# Patient Record
Sex: Male | Born: 1940
Health system: Southern US, Community
[De-identification: ages and names within clinical notes are randomized; demographics above are authoritative.]

## PROBLEM LIST (undated history)

## (undated) DIAGNOSIS — G629 Polyneuropathy, unspecified: Secondary | ICD-10-CM

## (undated) DIAGNOSIS — C801 Malignant (primary) neoplasm, unspecified: Secondary | ICD-10-CM

## (undated) DIAGNOSIS — N200 Calculus of kidney: Secondary | ICD-10-CM

## (undated) DIAGNOSIS — N400 Enlarged prostate without lower urinary tract symptoms: Secondary | ICD-10-CM

## (undated) DIAGNOSIS — M549 Dorsalgia, unspecified: Secondary | ICD-10-CM

## (undated) DIAGNOSIS — N189 Chronic kidney disease, unspecified: Secondary | ICD-10-CM

## (undated) DIAGNOSIS — F329 Major depressive disorder, single episode, unspecified: Secondary | ICD-10-CM

## (undated) DIAGNOSIS — M1990 Unspecified osteoarthritis, unspecified site: Secondary | ICD-10-CM

## (undated) DIAGNOSIS — R1310 Dysphagia, unspecified: Secondary | ICD-10-CM

## (undated) DIAGNOSIS — N10 Acute tubulo-interstitial nephritis: Secondary | ICD-10-CM

## (undated) DIAGNOSIS — J189 Pneumonia, unspecified organism: Secondary | ICD-10-CM

## (undated) DIAGNOSIS — R519 Headache, unspecified: Secondary | ICD-10-CM

## (undated) DIAGNOSIS — I10 Essential (primary) hypertension: Secondary | ICD-10-CM

## (undated) DIAGNOSIS — M25559 Pain in unspecified hip: Secondary | ICD-10-CM

## (undated) DIAGNOSIS — Z87442 Personal history of urinary calculi: Secondary | ICD-10-CM

## (undated) DIAGNOSIS — G8929 Other chronic pain: Secondary | ICD-10-CM

## (undated) DIAGNOSIS — J45909 Unspecified asthma, uncomplicated: Secondary | ICD-10-CM

## (undated) DIAGNOSIS — R918 Other nonspecific abnormal finding of lung field: Secondary | ICD-10-CM

## (undated) DIAGNOSIS — K219 Gastro-esophageal reflux disease without esophagitis: Secondary | ICD-10-CM

## (undated) DIAGNOSIS — F32A Depression, unspecified: Secondary | ICD-10-CM

## (undated) DIAGNOSIS — R51 Headache: Secondary | ICD-10-CM

## (undated) DIAGNOSIS — F4310 Post-traumatic stress disorder, unspecified: Secondary | ICD-10-CM

## (undated) DIAGNOSIS — F0390 Unspecified dementia without behavioral disturbance: Secondary | ICD-10-CM

## (undated) DIAGNOSIS — J449 Chronic obstructive pulmonary disease, unspecified: Secondary | ICD-10-CM

## (undated) DIAGNOSIS — G2581 Restless legs syndrome: Secondary | ICD-10-CM

## (undated) HISTORY — PX: ABDOMINAL SURGERY: SHX537

## (undated) HISTORY — PX: OTHER SURGICAL HISTORY: SHX169

## (undated) HISTORY — PX: EYE SURGERY: SHX253

## (undated) HISTORY — PX: HERNIA REPAIR: SHX51

---

## 2006-03-15 HISTORY — PX: FRACTURE SURGERY: SHX138

## 2012-08-30 ENCOUNTER — Encounter (HOSPITAL_COMMUNITY): Payer: Self-pay | Admitting: *Deleted

## 2012-08-30 ENCOUNTER — Emergency Department (HOSPITAL_COMMUNITY)
Admission: EM | Admit: 2012-08-30 | Discharge: 2012-08-30 | Disposition: A | Discharge status: Home / Self Care | Payer: Medicare Other | Attending: Emergency Medicine | Admitting: Emergency Medicine

## 2012-08-30 DIAGNOSIS — Z79899 Other long term (current) drug therapy: Secondary | ICD-10-CM | POA: Insufficient documentation

## 2012-08-30 DIAGNOSIS — F329 Major depressive disorder, single episode, unspecified: Secondary | ICD-10-CM | POA: Insufficient documentation

## 2012-08-30 DIAGNOSIS — Z760 Encounter for issue of repeat prescription: Secondary | ICD-10-CM | POA: Insufficient documentation

## 2012-08-30 DIAGNOSIS — F17200 Nicotine dependence, unspecified, uncomplicated: Secondary | ICD-10-CM | POA: Insufficient documentation

## 2012-08-30 DIAGNOSIS — M545 Low back pain, unspecified: Secondary | ICD-10-CM | POA: Insufficient documentation

## 2012-08-30 DIAGNOSIS — J45909 Unspecified asthma, uncomplicated: Secondary | ICD-10-CM | POA: Insufficient documentation

## 2012-08-30 DIAGNOSIS — G8929 Other chronic pain: Secondary | ICD-10-CM | POA: Insufficient documentation

## 2012-08-30 DIAGNOSIS — F3289 Other specified depressive episodes: Secondary | ICD-10-CM | POA: Insufficient documentation

## 2012-08-30 HISTORY — DX: Depression, unspecified: F32.A

## 2012-08-30 HISTORY — DX: Unspecified asthma, uncomplicated: J45.909

## 2012-08-30 HISTORY — DX: Major depressive disorder, single episode, unspecified: F32.9

## 2012-08-30 MED ORDER — TRAMADOL HCL 50 MG PO TABS: 100.0000 mg | ORAL_TABLET | Freq: Four times a day (QID) | ORAL | Status: DC | PRN

## 2012-08-30 MED ORDER — CYCLOBENZAPRINE HCL 10 MG PO TABS: 10.0000 mg | ORAL_TABLET | Freq: Three times a day (TID) | ORAL | Status: DC | PRN

## 2012-08-30 NOTE — ED Provider Notes (Signed)
History     CSN: [id number]  Arrival date & time 08/30/12  1346   First MD Initiated Contact with Patient 08/30/12 1402      No chief complaint on file.   (Consider location/radiation/quality/duration/timing/severity/associated sxs/prior treatment) HPI  Patient states he moved from [place] about 3 months ago and he has run out of his medications for about 3 months. He states he is trying to get his insurance card straight so currently he is without a PCP. Patient's only physical complaint is that he is having difficulty sleeping at night and he has chronic low back pain. When asked what medications patient was on he initially stated "the blue pill". On further questioning he states he was on tramadol, trazodone, and Flexeril. He however does not know the doses of any of his medications.  PCP none  Past Medical History  Diagnosis Date  . Depression   . Back pain   . Asthma     Past Surgical History  Procedure Laterality Date  . Eye surgery    . Abdominal surgery      History reviewed. No pertinent family history.  History  Substance Use Topics  . Smoking status: Current Every Day Smoker  . Smokeless tobacco: Not on file  . Alcohol Use: No  lives with daughter Smokes 3/4 ppd   Review of Systems  All other systems reviewed and are negative.    Allergies  Penicillins and Strawberry  Home Medications   Current Outpatient Rx  Name  Route  Sig  Dispense  Refill  . baclofen (LIORESAL) 10 MG tablet   Oral   Take 10 mg by mouth 3 (three) times daily.          . cyclobenzaprine (FLEXERIL) 10 MG tablet   Oral   Take 10 mg by mouth 3 (three) times daily as needed for muscle spasms.         . pantoprazole (PROTONIX) 40 MG tablet   Oral   Take 40 mg by mouth daily.         . pramipexole (MIRAPEX) 0.125 MG tablet   Oral   Take 0.125 mg by mouth at bedtime.         Marland Kitchen PRESCRIPTION MEDICATION   Oral   Take 1 tablet by mouth daily as needed.  Cyclobenzaprine-no record at pharmacy, pt not sure either         . traMADol (ULTRAM) 50 MG tablet   Oral   Take 50 mg by mouth every 6 (six) hours as needed for pain.         Marland Kitchen albuterol (PROVENTIL HFA;VENTOLIN HFA) 108 (90 BASE) MCG/ACT inhaler   Inhalation   Inhale 2 puffs into the lungs every 4 (four) hours as needed for wheezing or shortness of breath.         . DiphenhydrAMINE HCl, Sleep, (SLEEPING PO)   Oral   Take 1 tablet by mouth at bedtime as needed (sleep).           BP 162/88  Pulse 94  Temp(Src) 97.8 F (36.6 C) (Oral)  Resp 20  Ht 6\' 3"  (1.905 m)  Wt 163 lb (73.936 kg)  BMI 20.37 kg/m2  Vital signs normal except mild hypertension   Physical Exam  Nursing note and vitals reviewed. Constitutional: He is oriented to person, place, and time. He appears well-developed and well-nourished.  Non-toxic appearance. He does not appear ill. No distress.  HENT:  Head: Normocephalic and atraumatic.  Right Ear: External  ear normal.  Left Ear: External ear normal.  Nose: Nose normal. No mucosal edema or rhinorrhea.  Mouth/Throat: Oropharynx is clear and moist and mucous membranes are normal. No dental abscesses or edematous.  Eyes: Conjunctivae and EOM are normal. Pupils are equal, round, and reactive to light.  Neck: Normal range of motion and full passive range of motion without pain. Neck supple.  Cardiovascular: Normal rate, regular rhythm and normal heart sounds.  Exam reveals no gallop and no friction rub.   No murmur heard. Pulmonary/Chest: Effort normal and breath sounds normal. No respiratory distress. He has no wheezes. He has no rhonchi. He has no rales. He exhibits no tenderness and no crepitus.  Abdominal: Soft. Normal appearance and bowel sounds are normal. He exhibits no distension. There is no tenderness. There is no rebound and no guarding.  Musculoskeletal: Normal range of motion. He exhibits no edema and no tenderness.  Moves all extremities well.    Neurological: He is alert and oriented to person, place, and time. He has normal strength. No cranial nerve deficit.  Skin: Skin is warm, dry and intact. No rash noted. No erythema. No pallor.  Psychiatric: He has a normal mood and affect. His speech is normal and behavior is normal. His mood appears not anxious.    ED Course  Procedures (including critical care time)  The pharmacy tech initially contacted the [facility] locally who said patient had been on baclofen, protonic, and albuterol. He also told her he was taking Benadryl for sleep. When I discussed with patient that she did not find evidence of him being on the medication he said he was taking he then stated he had gotten his medications filled at the [facility]. They report to the pharmacy tech that patient had a prescription for trazodone which he never got filled. They did verify he has been on tramadol in the past. They also verify he had been on Flexeril in the past.    1. Medication refill     Discharge Medication List as of 08/30/2012  3:15 PM    START taking these medications   Details  !! cyclobenzaprine (FLEXERIL) 10 MG tablet Take 1 tablet (10 mg total) by mouth 3 (three) times daily as needed for muscle spasms., Starting 08/30/2012, Until Discontinued, Print    !! traMADol (ULTRAM) 50 MG tablet Take 2 tablets (100 mg total) by mouth every 6 (six) hours as needed., Starting 08/30/2012, Until Discontinued, Print     !! - Potential duplicate medications found. Please discuss with provider.      Plan discharge  Devoria Albe, MD, FACEP    MDM          Ward Givens, MD 08/30/12 [id number]

## 2012-08-30 NOTE — ED Notes (Addendum)
Pt says he needs a med refill . Has been unable to get appt for refill for 3 mos. Says he needs baclofen, trazadone, tramadol, albuterol inhaler.  His MD  Is in [place]. He has moved here and unable to get MD to get these meds.

## 2014-12-23 ENCOUNTER — Inpatient Hospital Stay (HOSPITAL_COMMUNITY)
Admission: EM | Admit: 2014-12-23 | Discharge: 2014-12-25 | DRG: 690 | Disposition: A | Discharge status: Home / Self Care | Payer: Medicare Other | Attending: Internal Medicine | Admitting: Internal Medicine

## 2014-12-23 ENCOUNTER — Encounter (HOSPITAL_COMMUNITY): Payer: Self-pay | Admitting: Emergency Medicine

## 2014-12-23 ENCOUNTER — Emergency Department (HOSPITAL_COMMUNITY): Payer: Medicare Other

## 2014-12-23 DIAGNOSIS — F17210 Nicotine dependence, cigarettes, uncomplicated: Secondary | ICD-10-CM | POA: Diagnosis present

## 2014-12-23 DIAGNOSIS — Z720 Tobacco use: Secondary | ICD-10-CM | POA: Diagnosis present

## 2014-12-23 DIAGNOSIS — E876 Hypokalemia: Secondary | ICD-10-CM | POA: Diagnosis present

## 2014-12-23 DIAGNOSIS — N179 Acute kidney failure, unspecified: Secondary | ICD-10-CM | POA: Diagnosis present

## 2014-12-23 DIAGNOSIS — D649 Anemia, unspecified: Secondary | ICD-10-CM | POA: Diagnosis present

## 2014-12-23 DIAGNOSIS — E871 Hypo-osmolality and hyponatremia: Secondary | ICD-10-CM | POA: Diagnosis present

## 2014-12-23 DIAGNOSIS — Z23 Encounter for immunization: Secondary | ICD-10-CM

## 2014-12-23 DIAGNOSIS — R918 Other nonspecific abnormal finding of lung field: Secondary | ICD-10-CM | POA: Diagnosis present

## 2014-12-23 DIAGNOSIS — R1032 Left lower quadrant pain: Secondary | ICD-10-CM | POA: Diagnosis not present

## 2014-12-23 DIAGNOSIS — N10 Acute tubulo-interstitial nephritis: Principal | ICD-10-CM

## 2014-12-23 DIAGNOSIS — R109 Unspecified abdominal pain: Secondary | ICD-10-CM

## 2014-12-23 DIAGNOSIS — E861 Hypovolemia: Secondary | ICD-10-CM | POA: Diagnosis present

## 2014-12-23 DIAGNOSIS — B9620 Unspecified Escherichia coli [E. coli] as the cause of diseases classified elsewhere: Secondary | ICD-10-CM | POA: Diagnosis present

## 2014-12-23 DIAGNOSIS — M1990 Unspecified osteoarthritis, unspecified site: Secondary | ICD-10-CM | POA: Diagnosis present

## 2014-12-23 HISTORY — DX: Other chronic pain: G89.29

## 2014-12-23 HISTORY — DX: Calculus of kidney: N20.0

## 2014-12-23 HISTORY — DX: Other nonspecific abnormal finding of lung field: R91.8

## 2014-12-23 HISTORY — DX: Dorsalgia, unspecified: M54.9

## 2014-12-23 HISTORY — DX: Pain in unspecified hip: M25.559

## 2014-12-23 HISTORY — DX: Polyneuropathy, unspecified: G62.9

## 2014-12-23 HISTORY — DX: Acute pyelonephritis: N10

## 2014-12-23 LAB — URINALYSIS, ROUTINE W REFLEX MICROSCOPIC
Bilirubin Urine: NEGATIVE
GLUCOSE, UA: NEGATIVE mg/dL
Ketones, ur: NEGATIVE mg/dL
Nitrite: POSITIVE — AB
PH: 6 (ref 5.0–8.0)
Protein, ur: 30 mg/dL — AB
SPECIFIC GRAVITY, URINE: 1.015 (ref 1.005–1.030)
Urobilinogen, UA: 1 mg/dL (ref 0.0–1.0)

## 2014-12-23 LAB — BASIC METABOLIC PANEL
Anion gap: 7 (ref 5–15)
BUN: 17 mg/dL (ref 6–20)
CALCIUM: 7.4 mg/dL — AB (ref 8.9–10.3)
CO2: 28 mmol/L (ref 22–32)
Chloride: 96 mmol/L — ABNORMAL LOW (ref 101–111)
Creatinine, Ser: 1.36 mg/dL — ABNORMAL HIGH (ref 0.61–1.24)
GFR calc Af Amer: 57 mL/min — ABNORMAL LOW (ref >60)
GFR, EST NON AFRICAN AMERICAN: 49 mL/min — AB (ref >60)
GLUCOSE: 103 mg/dL — AB (ref 65–99)
Potassium: 3.3 mmol/L — ABNORMAL LOW (ref 3.5–5.1)
Sodium: 131 mmol/L — ABNORMAL LOW (ref 135–145)

## 2014-12-23 LAB — CBC WITH DIFFERENTIAL/PLATELET
Basophils Absolute: 0 10*3/uL (ref 0.0–0.1)
Basophils Relative: 0 %
EOS PCT: 1 %
Eosinophils Absolute: 0.1 10*3/uL (ref 0.0–0.7)
HEMATOCRIT: 37.4 % — AB (ref 39.0–52.0)
Hemoglobin: 12.5 g/dL — ABNORMAL LOW (ref 13.0–17.0)
LYMPHS ABS: 0.9 10*3/uL (ref 0.7–4.0)
LYMPHS PCT: 9 %
MCH: 31.9 pg (ref 26.0–34.0)
MCHC: 33.4 g/dL (ref 30.0–36.0)
MCV: 95.4 fL (ref 78.0–100.0)
MONO ABS: 1 10*3/uL (ref 0.1–1.0)
MONOS PCT: 11 %
Neutro Abs: 7.5 10*3/uL (ref 1.7–7.7)
Neutrophils Relative %: 79 %
PLATELETS: 263 10*3/uL (ref 150–400)
RBC: 3.92 MIL/uL — ABNORMAL LOW (ref 4.22–5.81)
RDW: 14.1 % (ref 11.5–15.5)
WBC: 9.5 10*3/uL (ref 4.0–10.5)

## 2014-12-23 LAB — URINE MICROSCOPIC-ADD ON

## 2014-12-23 MED ORDER — GABAPENTIN 300 MG PO CAPS
300.0000 mg | ORAL_CAPSULE | Freq: Three times a day (TID) | ORAL | Status: DC
  Administered 2014-12-23 – 2014-12-25 (×6): 300 mg via ORAL
  Filled 2014-12-23 (×6): qty 1

## 2014-12-23 MED ORDER — HYDROMORPHONE HCL 1 MG/ML IJ SOLN: 0.5000 mg | INTRAMUSCULAR | Status: AC | PRN

## 2014-12-23 MED ORDER — POTASSIUM CHLORIDE CRYS ER 20 MEQ PO TBCR
40.0000 meq | EXTENDED_RELEASE_TABLET | Freq: Once | ORAL | Status: AC
  Administered 2014-12-23: 40 meq via ORAL
  Filled 2014-12-23: qty 2

## 2014-12-23 MED ORDER — FLUTICASONE PROPIONATE 50 MCG/ACT NA SUSP: 1.0000 | Freq: Every day | NASAL | Status: DC | PRN

## 2014-12-23 MED ORDER — ENSURE ENLIVE PO LIQD
237.0000 mL | Freq: Two times a day (BID) | ORAL | Status: DC
  Administered 2014-12-24 – 2014-12-25 (×4): 237 mL via ORAL

## 2014-12-23 MED ORDER — TRAZODONE HCL 50 MG PO TABS
200.0000 mg | ORAL_TABLET | Freq: Every day | ORAL | Status: DC
  Administered 2014-12-23 – 2014-12-24 (×2): 200 mg via ORAL
  Filled 2014-12-23 (×2): qty 4

## 2014-12-23 MED ORDER — PANTOPRAZOLE SODIUM 40 MG PO TBEC
40.0000 mg | DELAYED_RELEASE_TABLET | Freq: Every day | ORAL | Status: DC
Start: 2014-12-24 — End: 2014-12-25
  Administered 2014-12-24 – 2014-12-25 (×2): 40 mg via ORAL
  Filled 2014-12-23 (×2): qty 1

## 2014-12-23 MED ORDER — PRAMIPEXOLE DIHYDROCHLORIDE 0.25 MG PO TABS
0.1250 mg | ORAL_TABLET | Freq: Every day | ORAL | Status: DC
  Administered 2014-12-23 – 2014-12-24 (×2): 0.125 mg via ORAL
  Filled 2014-12-23: qty 0.5
  Filled 2014-12-23: qty 1
  Filled 2014-12-23: qty 0.5
  Filled 2014-12-23: qty 1

## 2014-12-23 MED ORDER — ALBUTEROL SULFATE (2.5 MG/3ML) 0.083% IN NEBU: 3.0000 mL | INHALATION_SOLUTION | RESPIRATORY_TRACT | Status: DC | PRN

## 2014-12-23 MED ORDER — SODIUM CHLORIDE 0.9 % IV SOLN
INTRAVENOUS | Status: DC
  Administered 2014-12-23 – 2014-12-25 (×2): via INTRAVENOUS

## 2014-12-23 MED ORDER — TRAMADOL HCL 50 MG PO TABS
50.0000 mg | ORAL_TABLET | Freq: Every day | ORAL | Status: DC | PRN
  Administered 2014-12-23 – 2014-12-24 (×2): 50 mg via ORAL
  Filled 2014-12-23 (×2): qty 1

## 2014-12-23 MED ORDER — ONDANSETRON HCL 4 MG PO TABS: 4.0000 mg | ORAL_TABLET | Freq: Four times a day (QID) | ORAL | Status: DC | PRN

## 2014-12-23 MED ORDER — ONDANSETRON HCL 4 MG/2ML IJ SOLN: 4.0000 mg | Freq: Four times a day (QID) | INTRAMUSCULAR | Status: DC | PRN

## 2014-12-23 MED ORDER — SERTRALINE HCL 50 MG PO TABS
50.0000 mg | ORAL_TABLET | Freq: Every day | ORAL | Status: DC
  Administered 2014-12-24 – 2014-12-25 (×2): 50 mg via ORAL
  Filled 2014-12-23 (×2): qty 1

## 2014-12-23 MED ORDER — CIPROFLOXACIN IN D5W 400 MG/200ML IV SOLN
400.0000 mg | Freq: Two times a day (BID) | INTRAVENOUS | Status: DC
  Administered 2014-12-23 – 2014-12-25 (×4): 400 mg via INTRAVENOUS
  Filled 2014-12-23 (×4): qty 200

## 2014-12-23 MED ORDER — INFLUENZA VAC SPLIT QUAD 0.5 ML IM SUSY
0.5000 mL | PREFILLED_SYRINGE | INTRAMUSCULAR | Status: DC
  Filled 2014-12-23: qty 0.5

## 2014-12-23 MED ORDER — ENOXAPARIN SODIUM 40 MG/0.4ML ~~LOC~~ SOLN
40.0000 mg | SUBCUTANEOUS | Status: DC
  Administered 2014-12-23 – 2014-12-24 (×2): 40 mg via SUBCUTANEOUS
  Filled 2014-12-23 (×2): qty 0.4

## 2014-12-23 MED ORDER — PNEUMOCOCCAL VAC POLYVALENT 25 MCG/0.5ML IJ INJ
0.5000 mL | INJECTION | INTRAMUSCULAR | Status: DC
  Filled 2014-12-23: qty 0.5

## 2014-12-23 MED ORDER — TAMSULOSIN HCL 0.4 MG PO CAPS
0.4000 mg | ORAL_CAPSULE | Freq: Every day | ORAL | Status: DC
  Administered 2014-12-23 – 2014-12-24 (×2): 0.4 mg via ORAL
  Filled 2014-12-23 (×2): qty 1

## 2014-12-23 MED ORDER — CEFTRIAXONE SODIUM 1 G IJ SOLR
1.0000 g | Freq: Once | INTRAMUSCULAR | Status: AC
  Administered 2014-12-23: 1 g via INTRAVENOUS
  Filled 2014-12-23: qty 10

## 2014-12-23 NOTE — ED Provider Notes (Signed)
CSN: [id number]     Arrival date & time 12/23/14  1556 History   First MD Initiated Contact with Patient 12/23/14 1659     Chief Complaint  Patient presents with  . Flank Pain     HPI  Pt was seen at 1700. Per pt, c/o gradual onset and worsening of persistent dysuria for the past 1 month. Has been associated with left sided flank "pain."  Denies fevers, no abd pain, no N/V/D, no hematuria, no testicular pain/swelling, no rash.   Past Medical History  Diagnosis Date  . Depression   . Asthma   . Chronic back pain   . Chronic hip pain   . Kidney stone    Past Surgical History  Procedure Laterality Date  . Eye surgery    . Abdominal surgery      heria    Social History  Substance Use Topics  . Smoking status: Current Every Day Smoker -- 0.50 packs/day    Types: Cigarettes  . Smokeless tobacco: None  . Alcohol Use: No    Review of Systems ROS: Statement: All systems negative except as marked or noted in the HPI; Constitutional: Negative for fever and chills. ; ; Eyes: Negative for eye pain, redness and discharge. ; ; ENMT: Negative for ear pain, hoarseness, nasal congestion, sinus pressure and sore throat. ; ; Cardiovascular: Negative for chest pain, palpitations, diaphoresis, dyspnea and peripheral edema. ; ; Respiratory: Negative for cough, wheezing and stridor. ; ; Gastrointestinal: Negative for nausea, vomiting, diarrhea, abdominal pain, blood in stool, hematemesis, jaundice and rectal bleeding. . ; ; Genitourinary: +dysuria, flank pain. Negative for hematuria. ; ; Genital:  No penile drainage or rash, no testicular pain or swelling, no scrotal rash or swelling. ;; Musculoskeletal: Negative for back pain and neck pain. Negative for swelling and trauma.; ; Skin: Negative for pruritus, rash, abrasions, blisters, bruising and skin lesion.; ; Neuro: Negative for headache, lightheadedness and neck stiffness. Negative for weakness, altered level of consciousness , altered mental status,  extremity weakness, paresthesias, involuntary movement, seizure and syncope.      Allergies  Bee venom; Penicillins; and Strawberry  Home Medications   Prior to Admission medications   Medication Sig Start Date End Date Taking? Authorizing Provider  albuterol (PROVENTIL HFA;VENTOLIN HFA) 108 (90 BASE) MCG/ACT inhaler Inhale 2 puffs into the lungs every 4 (four) hours as needed for wheezing or shortness of breath.   Yes Historical Provider, MD  DiphenhydrAMINE HCl, Sleep, (SLEEPING PO) Take 1 tablet by mouth at bedtime as needed (sleep).   Yes Historical Provider, MD  fluticasone (FLONASE) 50 MCG/ACT nasal spray Place 1 spray into both nostrils daily as needed for allergies or rhinitis.   Yes Historical Provider, MD  gabapentin (NEURONTIN) 300 MG capsule Take 300 mg by mouth 3 (three) times daily.   Yes Historical Provider, MD  naproxen (NAPROSYN) 500 MG tablet Take 500 mg by mouth every 12 (twelve) hours as needed for moderate pain.   Yes Historical Provider, MD  omeprazole (PRILOSEC) 20 MG capsule Take 20 mg by mouth daily.   Yes Historical Provider, MD  Pediatric Multiple Vit-C-FA (FLINSTONES GUMMIES OMEGA-3 DHA PO) Take 1 tablet by mouth 2 (two) times daily.   Yes Historical Provider, MD  pramipexole (MIRAPEX) 0.125 MG tablet Take 0.125 mg by mouth at bedtime.   Yes Historical Provider, MD  sertraline (ZOLOFT) 50 MG tablet Take 50 mg by mouth daily.   Yes Historical Provider, MD  tamsulosin (FLOMAX) 0.4 MG CAPS  capsule Take 0.4 mg by mouth at bedtime.   Yes Historical Provider, MD  traMADol (ULTRAM) 50 MG tablet Take 50 mg by mouth daily as needed for moderate pain.   Yes Historical Provider, MD  traZODone (DESYREL) 100 MG tablet Take 200 mg by mouth at bedtime.   Yes Historical Provider, MD  cyclobenzaprine (FLEXERIL) 10 MG tablet Take 1 tablet (10 mg total) by mouth 3 (three) times daily as needed for muscle spasms. Patient not taking: Reported on 12/23/2014 08/30/12   Rolland Porter, MD    BP 124/88 mmHg  Pulse 76  Temp(Src) 98.6 F (37 C) (Oral)  Resp 18  Ht 6' 3.5" (1.918 m)  Wt 209 lb (94.802 kg)  BMI 25.77 kg/m2  SpO2 100% Physical Exam  1705: Physical examination:  Nursing notes reviewed; Vital signs and O2 SAT reviewed;  Constitutional: Well developed, Well nourished, Well hydrated, In no acute distress; Head:  Normocephalic, atraumatic; Eyes: EOMI, PERRL, No scleral icterus; ENMT: Mouth and pharynx normal, Mucous membranes moist; Neck: Supple, Full range of motion, No lymphadenopathy; Cardiovascular: Regular rate and rhythm, No gallop; Respiratory: Breath sounds clear & equal bilaterally, No wheezes.  Speaking full sentences with ease, Normal respiratory effort/excursion; Chest: Nontender, Movement normal; Abdomen: Soft, Nontender, Nondistended, Normal bowel sounds; Genitourinary: +left CVA tenderness; Spine:  No midline CS, TS, LS tenderness.;; Extremities: Pulses normal, No tenderness, No edema, No calf edema or asymmetry.; Neuro: AA&Ox3, Major CN grossly intact.  Speech clear. No gross focal motor deficits in extremities. Walks with cane.; Skin: Color normal, Warm, Dry.   ED Course  Procedures (including critical care time) Labs Review   Imaging Review  I have personally reviewed and evaluated these images and lab results as part of my medical decision-making.   EKG Interpretation None      MDM  MDM Reviewed: nursing note and vitals Interpretation: labs and CT scan     Results for orders placed or performed during the hospital encounter of 12/23/14  CBC with Differential  Result Value Ref Range   WBC 9.5 4.0 - 10.5 K/uL   RBC 3.92 (L) 4.22 - 5.81 MIL/uL   Hemoglobin 12.5 (L) 13.0 - 17.0 g/dL   HCT 37.4 (L) 39.0 - 52.0 %   MCV 95.4 78.0 - 100.0 fL   MCH 31.9 26.0 - 34.0 pg   MCHC 33.4 30.0 - 36.0 g/dL   RDW 14.1 11.5 - 15.5 %   Platelets 263 150 - 400 K/uL   Neutrophils Relative % 79 %   Neutro Abs 7.5 1.7 - 7.7 K/uL   Lymphocytes Relative  9 %   Lymphs Abs 0.9 0.7 - 4.0 K/uL   Monocytes Relative 11 %   Monocytes Absolute 1.0 0.1 - 1.0 K/uL   Eosinophils Relative 1 %   Eosinophils Absolute 0.1 0.0 - 0.7 K/uL   Basophils Relative 0 %   Basophils Absolute 0.0 0.0 - 0.1 K/uL  Basic metabolic panel  Result Value Ref Range   Sodium 131 (L) 135 - 145 mmol/L   Potassium 3.3 (L) 3.5 - 5.1 mmol/L   Chloride 96 (L) 101 - 111 mmol/L   CO2 28 22 - 32 mmol/L   Glucose, Bld 103 (H) 65 - 99 mg/dL   BUN 17 6 - 20 mg/dL   Creatinine, Ser 1.36 (H) 0.61 - 1.24 mg/dL   Calcium 7.4 (L) 8.9 - 10.3 mg/dL   GFR calc non Af Amer 49 (L) >60 mL/min   GFR calc Af Amer 57 (  L) >60 mL/min   Anion gap 7 5 - 15   Ct Renal Stone Study 12/23/2014   CLINICAL DATA:  PT c/o left flank pain with painful urination x 1 month.  EXAM: CT ABDOMEN AND PELVIS WITHOUT CONTRAST  TECHNIQUE: Multidetector CT imaging of the abdomen and pelvis was performed following the standard protocol without IV contrast.  COMPARISON:  None.  FINDINGS: Lower chest: 2 adjacent pulmonary nodules left lung base measuring 2 mm each image 7. 7 mm subpleural pulmonary nodule medial left lung base image 8.  Hepatobiliary: Negative  Pancreas: Negative  Spleen: Negative  Adrenals/Urinary Tract: Adrenal glands are negative.  Right kidney is negative.  There is moderate perinephric inflammation on the left. There is no hydronephrosis or calculus. There is renal hilum vascular calcification. There is no hydronephrosis. No ureteral stones identified. There is moderate bladder wall thickening with mild inflammatory change around the bladder.  Stomach/Bowel: Negative  Vascular/Lymphatic: Atherosclerotic calcification of the aorta with minimal dilatation of the distal aorta 23.1 cm. Iliac arteries are calcified and mildly dilated proximally to about 15 mm bilaterally.  Reproductive: Negative  Other: No ascites  Musculoskeletal: No acute findings. Moderate dextroscoliosis lumbar spine. Moderate to severe  multilevel degenerative disc disease and degenerative facet change. Grade 1 borderline grade 2 anterior listhesis L5 on S1 appears to be on the basis of bilateral pars defects with a chronic appearance.  IMPRESSION: Findings most consistent with left pyelonephritis. There is also evidence to suggest bladder inflammation and possibly infection.  Pulmonary nodules left lung base. If the patient is at high risk for bronchogenic carcinoma, follow-up chest CT at 3-43months is recommended. If the patient is at low risk for bronchogenic carcinoma, follow-up chest CT at 6-12 months is recommended. This recommendation follows the consensus statement: Guidelines for Management of Small Pulmonary Nodules Detected on CT Scans: A Statement from the [place] as published in Radiology 2005; 237:395-400.   Electronically Signed   By: Skipper Cliche M.D.   On: 12/23/2014 18:18    2000:  UC pending; will dose IV rocephin. BUN/Cr mildly elevated, no old to compare; judicious IVF given. Dx and testing d/w pt.  Questions answered.  Verb understanding, agreeable to admit. T/C to [facility]. Anastasio Champion, case discussed, including:  HPI, pertinent PM/SHx, VS/PE, dx testing, ED course and treatment:  Agreeable to admit, requests to write temporary orders, obtain medical bed to team APAdmits.   Francine Graven, DO 12/25/14 2004

## 2014-12-23 NOTE — ED Notes (Addendum)
PT c/o left flank pain with painful urination x 1 month. PT also c/o left chronic hip pain.

## 2014-12-23 NOTE — H&P (Signed)
[facility] History and Physical  Aldo Sondgeroth NOM:[id number] DOB: [birth year] DOA: 12/23/2014  Referring physician: ER PCP: No PCP Per Patient   Chief Complaint: Flank pain  HPI: Anthony [name] is a 74 y.o. male  This is a 74 year old man who presents with dysuria and increasing frequency of micturition for the last 1 month. More recently in the last few days, he has had left-sided flank pain. He denies any fevers, nausea or vomiting or hematuria. There is no testicular pain or swelling. Urinalysis is consistent with UTI and CT scan shows pyelonephritis on the left side. He is now being admitted for further management.   Review of Systems:  Apart from symptoms above, all systems are negative.  Past Medical History  Diagnosis Date  . Depression   . Asthma   . Chronic back pain   . Chronic hip pain   . Kidney stone    Past Surgical History  Procedure Laterality Date  . Eye surgery    . Abdominal surgery      heria   Social History:  reports that he has been smoking Cigarettes.  He has been smoking about 0.50 packs per day. He does not have any smokeless tobacco history on file. He reports that he does not drink alcohol or use illicit drugs.  Allergies  Allergen Reactions  . Bee Venom Anaphylaxis  . Penicillins Hives    Has patient had a PCN reaction causing immediate rash, facial/tongue/throat swelling, SOB or lightheadedness with hypotension: NO Has patient had a PCN reaction causing severe rash involving mucus membranes or skin necrosis: NO Has patient had a PCN reaction that required hospitalization NoNO Has patient had a PCN reaction occurring within the last 10 years: NoNO If all of the above answers are "NO", then may proceed with Cephalosporin use.   . Strawberry Rash     Family history: His father had kidney problems apparently.  Prior to Admission medications   Medication Sig Start Date End Date Taking? Authorizing Provider  albuterol (PROVENTIL  HFA;VENTOLIN HFA) 108 (90 BASE) MCG/ACT inhaler Inhale 2 puffs into the lungs every 4 (four) hours as needed for wheezing or shortness of breath.   Yes Historical Provider, MD  DiphenhydrAMINE HCl, Sleep, (SLEEPING PO) Take 1 tablet by mouth at bedtime as needed (sleep).   Yes Historical Provider, MD  fluticasone (FLONASE) 50 MCG/ACT nasal spray Place 1 spray into both nostrils daily as needed for allergies or rhinitis.   Yes Historical Provider, MD  gabapentin (NEURONTIN) 300 MG capsule Take 300 mg by mouth 3 (three) times daily.   Yes Historical Provider, MD  naproxen (NAPROSYN) 500 MG tablet Take 500 mg by mouth every 12 (twelve) hours as needed for moderate pain.   Yes Historical Provider, MD  omeprazole (PRILOSEC) 20 MG capsule Take 20 mg by mouth daily.   Yes Historical Provider, MD  Pediatric Multiple Vit-C-FA (FLINSTONES GUMMIES OMEGA-3 DHA PO) Take 1 tablet by mouth 2 (two) times daily.   Yes Historical Provider, MD  pramipexole (MIRAPEX) 0.125 MG tablet Take 0.125 mg by mouth at bedtime.   Yes Historical Provider, MD  sertraline (ZOLOFT) 50 MG tablet Take 50 mg by mouth daily.   Yes Historical Provider, MD  tamsulosin (FLOMAX) 0.4 MG CAPS capsule Take 0.4 mg by mouth at bedtime.   Yes Historical Provider, MD  traMADol (ULTRAM) 50 MG tablet Take 50 mg by mouth daily as needed for moderate pain.   Yes Historical Provider, MD  traZODone (DESYREL) 100 MG  tablet Take 200 mg by mouth at bedtime.   Yes Historical Provider, MD  cyclobenzaprine (FLEXERIL) 10 MG tablet Take 1 tablet (10 mg total) by mouth 3 (three) times daily as needed for muscle spasms. Patient not taking: Reported on 12/23/2014 08/30/12   Rolland Porter, MD   Physical Exam: Filed Vitals:   12/23/14 1608 12/23/14 2057 12/23/14 2121  BP: 124/88 124/65 124/64  Pulse: 76 80 62  Temp: 98.6 F (37 C)  98.4 F (36.9 C)  TempSrc: Oral  Oral  Resp: 18 18 18   Height: 6' 3.5" (1.918 m)  6' 3.5" (1.918 m)  Weight: 94.802 kg (209 lb)  83.6  kg (184 lb 4.9 oz)  SpO2: 100% 98% 93%    Wt Readings from Last 3 Encounters:  12/23/14 83.6 kg (184 lb 4.9 oz)  08/30/12 73.936 kg (163 lb)    General:  Appears calm and comfortable. He does not look septic or toxic. Eyes: PERRL, normal lids, irises & conjunctiva ENT: grossly normal hearing, lips & tongue Neck: no LAD, masses or thyromegaly Cardiovascular: RRR, no m/r/g. No LE edema. Telemetry: SR, no arrhythmias  Respiratory: CTA bilaterally, no w/r/r. Normal respiratory effort. Abdomen: soft, he has left loin tenderness. Skin: no rash or induration seen on limited exam Musculoskeletal: grossly normal tone BUE/BLE Psychiatric: grossly normal mood and affect, speech fluent and appropriate Neurologic: grossly non-focal.          Labs on Admission:  Basic Metabolic Panel:  Recent Labs Lab 12/23/14 1705  NA 131*  K 3.3*  CL 96*  CO2 28  GLUCOSE 103*  BUN 17  CREATININE 1.36*  CALCIUM 7.4*   Liver Function Tests: No results for input(s): AST, ALT, ALKPHOS, BILITOT, PROT, ALBUMIN in the last 168 hours. No results for input(s): LIPASE, AMYLASE in the last 168 hours. No results for input(s): AMMONIA in the last 168 hours. CBC:  Recent Labs Lab 12/23/14 1705  WBC 9.5  NEUTROABS 7.5  HGB 12.5*  HCT 37.4*  MCV 95.4  PLT 263   Cardiac Enzymes: No results for input(s): CKTOTAL, CKMB, CKMBINDEX, TROPONINI in the last 168 hours.  BNP (last 3 results) No results for input(s): BNP in the last 8760 hours.  ProBNP (last 3 results) No results for input(s): PROBNP in the last 8760 hours.  CBG: No results for input(s): GLUCAP in the last 168 hours.  Radiological Exams on Admission: Ct Renal Stone Study  12/23/2014   CLINICAL DATA:  PT c/o left flank pain with painful urination x 1 month.  EXAM: CT ABDOMEN AND PELVIS WITHOUT CONTRAST  TECHNIQUE: Multidetector CT imaging of the abdomen and pelvis was performed following the standard protocol without IV contrast.   COMPARISON:  None.  FINDINGS: Lower chest: 2 adjacent pulmonary nodules left lung base measuring 2 mm each image 7. 7 mm subpleural pulmonary nodule medial left lung base image 8.  Hepatobiliary: Negative  Pancreas: Negative  Spleen: Negative  Adrenals/Urinary Tract: Adrenal glands are negative.  Right kidney is negative.  There is moderate perinephric inflammation on the left. There is no hydronephrosis or calculus. There is renal hilum vascular calcification. There is no hydronephrosis. No ureteral stones identified. There is moderate bladder wall thickening with mild inflammatory change around the bladder.  Stomach/Bowel: Negative  Vascular/Lymphatic: Atherosclerotic calcification of the aorta with minimal dilatation of the distal aorta 23.1 cm. Iliac arteries are calcified and mildly dilated proximally to about 15 mm bilaterally.  Reproductive: Negative  Other: No ascites  Musculoskeletal: No  acute findings. Moderate dextroscoliosis lumbar spine. Moderate to severe multilevel degenerative disc disease and degenerative facet change. Grade 1 borderline grade 2 anterior listhesis L5 on S1 appears to be on the basis of bilateral pars defects with a chronic appearance.  IMPRESSION: Findings most consistent with left pyelonephritis. There is also evidence to suggest bladder inflammation and possibly infection.  Pulmonary nodules left lung base. If the patient is at high risk for bronchogenic carcinoma, follow-up chest CT at 3-73months is recommended. If the patient is at low risk for bronchogenic carcinoma, follow-up chest CT at 6-12 months is recommended. This recommendation follows the consensus statement: Guidelines for Management of Small Pulmonary Nodules Detected on CT Scans: A Statement from the [place] as published in Radiology 2005; 237:395-400.   Electronically Signed   By: Skipper Cliche M.D.   On: 12/23/2014 18:18      Assessment/Plan   1. Acute left pyelonephritis. He will be treated  with intravenous ciprofloxacin. 2. Renal insufficiency. This may be secondary to hypovolemia. He'll be given IV fluids and renal function will be monitored.  He will be admitted to the medical floor. Further recommendations will depend on patient's hospital progress.   Code Status: Full code  DVT Prophylaxis: Lovenox.  Family Communication: I discussed the plan with the patient at the bedside.   Disposition Plan: Home when medically stable.   Time spent: 45 minutes.  Doree Albee [facility] Pager [phone].

## 2014-12-24 DIAGNOSIS — N179 Acute kidney failure, unspecified: Secondary | ICD-10-CM | POA: Diagnosis present

## 2014-12-24 LAB — COMPREHENSIVE METABOLIC PANEL
ALBUMIN: 2.2 g/dL — AB (ref 3.5–5.0)
ALK PHOS: 61 U/L (ref 38–126)
ALT: 18 U/L (ref 17–63)
AST: 24 U/L (ref 15–41)
Anion gap: 6 (ref 5–15)
BILIRUBIN TOTAL: 0.7 mg/dL (ref 0.3–1.2)
BUN: 16 mg/dL (ref 6–20)
CALCIUM: 7.2 mg/dL — AB (ref 8.9–10.3)
CO2: 27 mmol/L (ref 22–32)
CREATININE: 1.35 mg/dL — AB (ref 0.61–1.24)
Chloride: 101 mmol/L (ref 101–111)
GFR calc Af Amer: 57 mL/min — ABNORMAL LOW (ref >60)
GFR calc non Af Amer: 49 mL/min — ABNORMAL LOW (ref >60)
GLUCOSE: 108 mg/dL — AB (ref 65–99)
Potassium: 3.6 mmol/L (ref 3.5–5.1)
Sodium: 134 mmol/L — ABNORMAL LOW (ref 135–145)
TOTAL PROTEIN: 6.6 g/dL (ref 6.5–8.1)

## 2014-12-24 LAB — CBC
HCT: 32.9 % — ABNORMAL LOW (ref 39.0–52.0)
Hemoglobin: 10.8 g/dL — ABNORMAL LOW (ref 13.0–17.0)
MCH: 31.4 pg (ref 26.0–34.0)
MCHC: 32.8 g/dL (ref 30.0–36.0)
MCV: 95.6 fL (ref 78.0–100.0)
PLATELETS: 238 10*3/uL (ref 150–400)
RBC: 3.44 MIL/uL — AB (ref 4.22–5.81)
RDW: 14.1 % (ref 11.5–15.5)
WBC: 9.9 10*3/uL (ref 4.0–10.5)

## 2014-12-24 NOTE — Care Management Note (Signed)
Case Management Note  Patient Details  Name: [name] MRN: [id number] Date of Birth: 01-25-1941  Subjective/Objective:                  Pt admitted from home with pyelonephritis. Pt lives with his daughter and will return home at discharge. Pt has a cane for prn use. Pt is fairly independent with ADL's and still cooks some of his meals.  Action/Plan: No CM needs anticipated at discharge.  Expected Discharge Date:                  Expected Discharge Plan:  Home/Self Care  In-House Referral:  NA  Discharge planning Services  CM Consult  Post Acute Care Choice:  NA Choice offered to:  NA  DME Arranged:    DME Agency:     HH Arranged:    HH Agency:     Status of Service:  Completed, signed off  Medicare Important Message Given:    Date Medicare IM Given:    Medicare IM give by:    Date Additional Medicare IM Given:    Additional Medicare Important Message give by:     If discussed at [place] of Stay Meetings, dates discussed:    Additional Comments:  Joylene Draft, RN 12/24/2014, 11:09 AM

## 2014-12-24 NOTE — Progress Notes (Signed)
[facility] PROGRESS NOTE  Anthony [name] RCV:[id number] DOB: 1940/04/10 DOA: 12/23/2014 PCP: No PCP Per Patient  Assessment/Plan: Pyelonephritis -Continue cipro pending cx data.  ARF -?related to hypovolemia or even some CKD. -Recheck renal function in am.  Code Status: Full Code Family Communication: Patient only  Disposition Plan: Home when ready; likely in 24 hours. If simple UTI would DC today but believe we need urine cx data given pyelonephritis.   Consultants:  None   Antibiotics:  Cipro   Subjective: No complaints. Feels well.  Objective: Filed Vitals:   12/23/14 2057 12/23/14 2121 12/24/14 0533 12/24/14 1330  BP: 124/65 124/64 130/62 105/65  Pulse: 80 62 66 90  Temp:  98.4 F (36.9 C) 98.2 F (36.8 C) 97.8 F (36.6 C)  TempSrc:  Oral Oral Oral  Resp: 18 18 18 18   Height:  6' 3.5" (1.918 m)    Weight:  83.6 kg (184 lb 4.9 oz)    SpO2: 98% 93% 94% 97%    Intake/Output Summary (Last 24 hours) at 12/24/14 1415 Last data filed at 12/24/14 1200  Gross per 24 hour  Intake    480 ml  Output   1000 ml  Net   -520 ml   Filed Weights   12/23/14 1608 12/23/14 2121  Weight: 94.802 kg (209 lb) 83.6 kg (184 lb 4.9 oz)    Exam:   General:  AA Ox3  Cardiovascular: RRR  Respiratory: CTA B  Abdomen: S/NT/ND/+BS  Extremities: no C/C/E   Neurologic:  Non-focal, grossly intact  Data Reviewed: Basic Metabolic Panel:  Recent Labs Lab 12/23/14 1705 12/24/14 0544  NA 131* 134*  K 3.3* 3.6  CL 96* 101  CO2 28 27  GLUCOSE 103* 108*  BUN 17 16  CREATININE 1.36* 1.35*  CALCIUM 7.4* 7.2*   Liver Function Tests:  Recent Labs Lab 12/24/14 0544  AST 24  ALT 18  ALKPHOS 61  BILITOT 0.7  PROT 6.6  ALBUMIN 2.2*   No results for input(s): LIPASE, AMYLASE in the last 168 hours. No results for input(s): AMMONIA in the last 168 hours. CBC:  Recent Labs Lab 12/23/14 1705 12/24/14 0544  WBC 9.5 9.9  NEUTROABS 7.5  --   HGB 12.5*  10.8*  HCT 37.4* 32.9*  MCV 95.4 95.6  PLT 263 238   Cardiac Enzymes: No results for input(s): CKTOTAL, CKMB, CKMBINDEX, TROPONINI in the last 168 hours. BNP (last 3 results) No results for input(s): BNP in the last 8760 hours.  ProBNP (last 3 results) No results for input(s): PROBNP in the last 8760 hours.  CBG: No results for input(s): GLUCAP in the last 168 hours.  Recent Results (from the past 240 hour(s))  Urine culture     Status: None (Preliminary result)   Collection Time: 12/23/14  6:53 PM  Result Value Ref Range Status   Specimen Description URINE, CLEAN CATCH  Final   Special Requests NONE  Final   Culture   Final    TOO YOUNG TO READ Performed at [facility]    Report Status PENDING  Incomplete     Studies: Ct Renal Stone Study  12/23/2014   CLINICAL DATA:  PT c/o left flank pain with painful urination x 1 month.  EXAM: CT ABDOMEN AND PELVIS WITHOUT CONTRAST  TECHNIQUE: Multidetector CT imaging of the abdomen and pelvis was performed following the standard protocol without IV contrast.  COMPARISON:  None.  FINDINGS: Lower chest: 2 adjacent pulmonary nodules left lung  base measuring 2 mm each image 7. 7 mm subpleural pulmonary nodule medial left lung base image 8.  Hepatobiliary: Negative  Pancreas: Negative  Spleen: Negative  Adrenals/Urinary Tract: Adrenal glands are negative.  Right kidney is negative.  There is moderate perinephric inflammation on the left. There is no hydronephrosis or calculus. There is renal hilum vascular calcification. There is no hydronephrosis. No ureteral stones identified. There is moderate bladder wall thickening with mild inflammatory change around the bladder.  Stomach/Bowel: Negative  Vascular/Lymphatic: Atherosclerotic calcification of the aorta with minimal dilatation of the distal aorta 23.1 cm. Iliac arteries are calcified and mildly dilated proximally to about 15 mm bilaterally.  Reproductive: Negative  Other: No ascites   Musculoskeletal: No acute findings. Moderate dextroscoliosis lumbar spine. Moderate to severe multilevel degenerative disc disease and degenerative facet change. Grade 1 borderline grade 2 anterior listhesis L5 on S1 appears to be on the basis of bilateral pars defects with a chronic appearance.  IMPRESSION: Findings most consistent with left pyelonephritis. There is also evidence to suggest bladder inflammation and possibly infection.  Pulmonary nodules left lung base. If the patient is at high risk for bronchogenic carcinoma, follow-up chest CT at 3-47months is recommended. If the patient is at low risk for bronchogenic carcinoma, follow-up chest CT at 6-12 months is recommended. This recommendation follows the consensus statement: Guidelines for Management of Small Pulmonary Nodules Detected on CT Scans: A Statement from the [employer] as published in Radiology 2005; 237:395-400.   Electronically Signed   By: Skipper Cliche M.D.   On: 12/23/2014 18:18    Scheduled Meds: . ciprofloxacin  400 mg Intravenous Q12H  . enoxaparin (LOVENOX) injection  40 mg Subcutaneous Q24H  . feeding supplement (ENSURE ENLIVE)  237 mL Oral BID BM  . gabapentin  300 mg Oral TID  . Influenza vac split quadrivalent PF  0.5 mL Intramuscular Tomorrow-1000  . pantoprazole  40 mg Oral Daily  . pneumococcal 23 valent vaccine  0.5 mL Intramuscular Tomorrow-1000  . pramipexole  0.125 mg Oral QHS  . sertraline  50 mg Oral Daily  . tamsulosin  0.4 mg Oral QHS  . traZODone  200 mg Oral QHS   Continuous Infusions: . sodium chloride 75 mL/hr at 12/23/14 2254    Active Problems:   Acute pyelonephritis    Time spent: 25 minutes. Greater than 50% of this time was spent in direct contact with the patient coordinating care.    Lelon Frohlich  [facility] Pager [phone]  If 7PM-7AM, please contact night-coverage at [email or web address], password [facility] 12/24/2014, 2:15 PM  LOS: 1 day

## 2014-12-24 NOTE — Progress Notes (Signed)
Nutrition Brief Note  Patient identified on the Malnutrition Screening Tool (MST) Report  Wt Readings from Last 15 Encounters:  12/23/14 184 lb 4.9 oz (83.6 kg)  08/30/12 163 lb (73.936 kg)    Body mass index is 22.73 kg/(m^2). Patient meets criteria for Regular wt based on current BMI.   Pt states that his normal weight is 209 lbs, give or take a few times. He reports he weighed this a couple weeks ago. However, he states that his appetite has not changed. He reports no n/v/c/d prior to his admission that could have caused this weight loss.   He has actually gained weight in the long run and he likely just thought he was heavier than he actually is.  He was eating lunch when RD arrived. He states that he "feels better than he has in weeks" and in anxious to go home.   He was wondering why he did not receive salt. Educated pt on what a [place] is and the rationale behind it.   Current diet order is HH, patient is consuming approximately 100% of meals at this time. Labs and medications reviewed.   No nutrition interventions warranted at this time. If nutrition issues arise, please consult RD.   Burtis Junes RD, LDN Nutrition Pager: [phone] 12/24/2014 12:22 PM

## 2014-12-25 ENCOUNTER — Encounter (HOSPITAL_COMMUNITY): Payer: Self-pay | Admitting: Internal Medicine

## 2014-12-25 DIAGNOSIS — Z720 Tobacco use: Secondary | ICD-10-CM | POA: Diagnosis present

## 2014-12-25 DIAGNOSIS — N10 Acute tubulo-interstitial nephritis: Principal | ICD-10-CM

## 2014-12-25 DIAGNOSIS — E876 Hypokalemia: Secondary | ICD-10-CM

## 2014-12-25 DIAGNOSIS — D649 Anemia, unspecified: Secondary | ICD-10-CM

## 2014-12-25 DIAGNOSIS — N179 Acute kidney failure, unspecified: Secondary | ICD-10-CM

## 2014-12-25 DIAGNOSIS — R918 Other nonspecific abnormal finding of lung field: Secondary | ICD-10-CM

## 2014-12-25 DIAGNOSIS — E871 Hypo-osmolality and hyponatremia: Secondary | ICD-10-CM

## 2014-12-25 HISTORY — DX: Other nonspecific abnormal finding of lung field: R91.8

## 2014-12-25 LAB — BASIC METABOLIC PANEL
Anion gap: 5 (ref 5–15)
BUN: 17 mg/dL (ref 6–20)
CALCIUM: 7.7 mg/dL — AB (ref 8.9–10.3)
CO2: 25 mmol/L (ref 22–32)
CREATININE: 1.36 mg/dL — AB (ref 0.61–1.24)
Chloride: 104 mmol/L (ref 101–111)
GFR calc non Af Amer: 49 mL/min — ABNORMAL LOW (ref >60)
GFR, EST AFRICAN AMERICAN: 57 mL/min — AB (ref >60)
Glucose, Bld: 102 mg/dL — ABNORMAL HIGH (ref 65–99)
Potassium: 3.6 mmol/L (ref 3.5–5.1)
SODIUM: 134 mmol/L — AB (ref 135–145)

## 2014-12-25 MED ORDER — CIPROFLOXACIN HCL 500 MG PO TABS: 500.0000 mg | ORAL_TABLET | Freq: Two times a day (BID) | ORAL | Status: DC

## 2014-12-25 NOTE — Discharge Summary (Signed)
Physician Discharge Summary  Anthony [name] WGY:[id number] DOB: [birth year] DOA: 12/23/2014  PCP: No PCP Per Patient  Admit date: 12/23/2014 Discharge date: 12/25/2014  Time spent: Greater than 30 minutes  Recommendations for Outpatient Follow-up:  1.  Recommend follow-up CT of the chest in 3-6 months for reevaluation of pulmonary nodules given patient's history of tobacco use/abuse. 2. Urine culture grew Escherichia coli, but sensitivities were pending at the time of discharge. Recommend follow-up of the sensitivities. 3. Recommend elective/outpatient follow-up evaluation of the patient's anemia.  Discharge Diagnoses:  1. Acute left pyelonephritis. 2. Acute kidney injury; query superimposed on chronic kidney disease-stage II to stage III -Patient's baseline creatinine was unknown. -Patient's creatinine was 1.36 on admission and 1.36 at the time of discharge. 3. Left base pulmonary nodules. 4. Hyponatremia, secondary to hypovolemia/volume depletion. 5. Mild hypokalemia. 6. Normocytic anemia. Patient's hemoglobin was 10.8 at the time of discharge.   Discharge Condition: Improved.  Diet recommendation: Heart healthy  Filed Weights   12/23/14 1608 12/23/14 2121  Weight: 94.802 kg (209 lb) 83.6 kg (184 lb 4.9 oz)    History of present illness:  The patient is a 74 year old man with a history of asthma, neuropathy and DJD, who presented to the emergency department on 12/23/2014 with a complaint of left flank pain, pain with urination, and increased urinary frequency. In the ED, he was afebrile and hemodynamically stable. His urinalysis revealed many bacteria, positive nitrite, and too numerous to count the WBCs. His lab data were significant for serum sodium of 131, potassium 3.3, creatinine 1.36, and hemoglobin 12.5. He was admitted for further evaluation and management.  Hospital Course:  Urine culture was ordered on admission. He was started on IV Cipro and vigorous IV fluids. His  chronic medications were continued. He was repleted with potassium chloride orally. His electrolytes and renal function were followed closely. Over the course of the hospitalization, the patient improved clinically and symptomatically. His white blood cell count remained within normal limits. He remained afebrile. His creatinine remained stable at 1.36. Although the patient denied any history of chronic kidney disease, his creatinine did not improve with IV fluid hydration and treatment of his pyelonephritis over a 48 hour period. Therefore, the patient may have underlying chronic kidney disease stage II to stage III, but his baseline creatinine was unknown. His serum potassium improved 3.6. His serum sodium improved to 134. His hemoglobin did decrease from 12.5-10.9, likely from the dilutional effects of IV fluids. Would recommend further outpatient evaluation per the discretion of his PCP. His urine culture did grow out greater than 100,000 colonies of Escherichia coli, but the sensitivities were pending at the time of discharge. Would recommend follow-up of the urine culture.  Patient received 48 hours of IV Cipro. He was discontinued on 7 more days of oral Cipro.  Of note, renal stone CT on admission revealed left pulmonary nodules. Given that the patient is a smoker, radiology recommended follow-up CT of his chest in 3-6 months. The patient was advised to stop smoking.   Procedures:  None  Consultations:  None  Discharge Exam: Filed Vitals:   12/25/14 0618  BP: 106/71  Pulse: 67  Temp: 97.9 F (36.6 C)  Resp: 20  Oxygen saturation 94% on room air.   General: pleasant 74 year old man sitting up in bed, in no acute distress. Cardiovascular: S1, S2, with a 2/6 systolic murmur. Respiratory: decreased breath sounds in the bases, otherwise clear. Abdomen/flank: Positive bowel sounds, soft, nontender, nondistended. No CVA tenderness.  Discharge  Instructions    Current Discharge  Medication List    START taking these medications   Details  ciprofloxacin (CIPRO) 500 MG tablet Take 1 tablet (500 mg total) by mouth 2 (two) times daily. ANTIBIOTIC TO BE TAKEN FOR 7 MORE DAYS. Qty: 14 tablet, Refills: 0      CONTINUE these medications which have NOT CHANGED   Details  albuterol (PROVENTIL HFA;VENTOLIN HFA) 108 (90 BASE) MCG/ACT inhaler Inhale 2 puffs into the lungs every 4 (four) hours as needed for wheezing or shortness of breath.    DiphenhydrAMINE HCl, Sleep, (SLEEPING PO) Take 1 tablet by mouth at bedtime as needed (sleep).    fluticasone (FLONASE) 50 MCG/ACT nasal spray Place 1 spray into both nostrils daily as needed for allergies or rhinitis.    gabapentin (NEURONTIN) 300 MG capsule Take 300 mg by mouth 3 (three) times daily.    naproxen (NAPROSYN) 500 MG tablet Take 500 mg by mouth every 12 (twelve) hours as needed for moderate pain.    omeprazole (PRILOSEC) 20 MG capsule Take 20 mg by mouth daily.    Pediatric Multiple Vit-C-FA (FLINSTONES GUMMIES OMEGA-3 DHA PO) Take 1 tablet by mouth 2 (two) times daily.    pramipexole (MIRAPEX) 0.125 MG tablet Take 0.125 mg by mouth at bedtime.    sertraline (ZOLOFT) 50 MG tablet Take 50 mg by mouth daily.    tamsulosin (FLOMAX) 0.4 MG CAPS capsule Take 0.4 mg by mouth at bedtime.    traMADol (ULTRAM) 50 MG tablet Take 50 mg by mouth daily as needed for moderate pain.    traZODone (DESYREL) 100 MG tablet Take 200 mg by mouth at bedtime.      STOP taking these medications     cyclobenzaprine (FLEXERIL) 10 MG tablet        Allergies  Allergen Reactions  . Bee Venom Anaphylaxis  . Penicillins Hives    Has patient had a PCN reaction causing immediate rash, facial/tongue/throat swelling, SOB or lightheadedness with hypotension: NO Has patient had a PCN reaction causing severe rash involving mucus membranes or skin necrosis: NO Has patient had a PCN reaction that required hospitalization -unknown Has patient  had a PCN reaction occurring within the last 10 years: unknown If all of the above answers are "NO", then may proceed with Cephalosporin use.   [name] Rash   Follow-up Information    Follow up with [facility] On 01/01/2015.   Why:  @2pm    Contact information:   [address] [place] [address] [phone]        The results of significant diagnostics from this hospitalization (including imaging, microbiology, ancillary and laboratory) are listed below for reference.    Significant Diagnostic Studies: Ct Renal Stone Study  12/23/2014  CLINICAL DATA:  PT c/o left flank pain with painful urination x 1 month. EXAM: CT ABDOMEN AND PELVIS WITHOUT CONTRAST TECHNIQUE: Multidetector CT imaging of the abdomen and pelvis was performed following the standard protocol without IV contrast. COMPARISON:  None. FINDINGS: Lower chest: 2 adjacent pulmonary nodules left lung base measuring 2 mm each image 7. 7 mm subpleural pulmonary nodule medial left lung base image 8. Hepatobiliary: Negative Pancreas: Negative Spleen: Negative Adrenals/Urinary Tract: Adrenal glands are negative. Right kidney is negative. There is moderate perinephric inflammation on the left. There is no hydronephrosis or calculus. There is renal hilum vascular calcification. There is no hydronephrosis. No ureteral stones identified. There is moderate bladder wall thickening with mild inflammatory change  around the bladder. Stomach/Bowel: Negative Vascular/Lymphatic: Atherosclerotic calcification of the aorta with minimal dilatation of the distal aorta 23.1 cm. Iliac arteries are calcified and mildly dilated proximally to about 15 mm bilaterally. Reproductive: Negative Other: No ascites Musculoskeletal: No acute findings. Moderate dextroscoliosis lumbar spine. Moderate to severe multilevel degenerative disc disease and degenerative facet change. Grade 1 borderline grade 2 anterior listhesis L5 on S1 appears to  be on the basis of bilateral pars defects with a chronic appearance. IMPRESSION: Findings most consistent with left pyelonephritis. There is also evidence to suggest bladder inflammation and possibly infection. Pulmonary nodules left lung base. If the patient is at high risk for bronchogenic carcinoma, follow-up chest CT at 3-74months is recommended. If the patient is at low risk for bronchogenic carcinoma, follow-up chest CT at 6-12 months is recommended. This recommendation follows the consensus statement: Guidelines for Management of Small Pulmonary Nodules Detected on CT Scans: A Statement from the [place] as published in Radiology 2005; 237:395-400. Electronically Signed   By: Skipper Cliche M.D.   On: 12/23/2014 18:18    Microbiology: Recent Results (from the past 240 hour(s))  Urine culture     Status: None (Preliminary result)   Collection Time: 12/23/14  6:53 PM  Result Value Ref Range Status   Specimen Description URINE, CLEAN CATCH  Final   Special Requests NONE  Final   Culture   Final    >=100,000 COLONIES/mL ESCHERICHIA COLI Performed at [facility]    Report Status PENDING  Incomplete     Labs: Basic Metabolic Panel:  Recent Labs Lab 12/23/14 1705 12/24/14 0544 12/25/14 0544  NA 131* 134* 134*  K 3.3* 3.6 3.6  CL 96* 101 104  CO2 28 27 25   GLUCOSE 103* 108* 102*  BUN 17 16 17   CREATININE 1.36* 1.35* 1.36*  CALCIUM 7.4* 7.2* 7.7*   Liver Function Tests:  Recent Labs Lab 12/24/14 0544  AST 24  ALT 18  ALKPHOS 61  BILITOT 0.7  PROT 6.6  ALBUMIN 2.2*   No results for input(s): LIPASE, AMYLASE in the last 168 hours. No results for input(s): AMMONIA in the last 168 hours. CBC:  Recent Labs Lab 12/23/14 1705 12/24/14 0544  WBC 9.5 9.9  NEUTROABS 7.5  --   HGB 12.5* 10.8*  HCT 37.4* 32.9*  MCV 95.4 95.6  PLT 263 238   Cardiac Enzymes: No results for input(s): CKTOTAL, CKMB, CKMBINDEX, TROPONINI in the last 168 hours. BNP: BNP  (last 3 results) No results for input(s): BNP in the last 8760 hours.  ProBNP (last 3 results) No results for input(s): PROBNP in the last 8760 hours.  CBG: No results for input(s): GLUCAP in the last 168 hours.     Signed:  Renwick Asman  [facility] 12/25/2014, 12:41 PM

## 2014-12-25 NOTE — Progress Notes (Signed)
Pt's IV catheter removed and intact. Pt's IV site clean dry and intact. Discharge instructions, follow up appointments, and medications reviewed and discussed with patient. Pt verbalized understanding and all questions were answered. No further questions at this time. Pt in stable condition and in no acute distress at this time. Pt will be escorted by nurse tech.

## 2014-12-25 NOTE — Care Management Note (Signed)
Case Management Note  Patient Details  Name: Jayanth Szczesniak MRN: [id number] Date of Birth: Feb 07, 1941  Subjective/Objective:                    Action/Plan:   Expected Discharge Date:                  Expected Discharge Plan:  Home/Self Care  In-House Referral:  NA  Discharge planning Services  CM Consult  Post Acute Care Choice:  NA Choice offered to:  NA  DME Arranged:    DME Agency:     HH Arranged:    [place] Agency:     Status of Service:  Completed, signed off  Medicare Important Message Given:    Date Medicare IM Given:    Medicare IM give by:    Date Additional Medicare IM Given:    Additional Medicare Important Message give by:     If discussed at [place] of Stay Meetings, dates discussed:    Additional Comments: Pt discharged home today. No CM needs noted. Christinia Gully Olpe, RN 12/25/2014, 2:25 PM

## 2014-12-26 LAB — URINE CULTURE

## 2015-01-29 ENCOUNTER — Encounter (INDEPENDENT_AMBULATORY_CARE_PROVIDER_SITE_OTHER): Payer: Self-pay | Admitting: *Deleted

## 2015-02-26 ENCOUNTER — Ambulatory Visit (INDEPENDENT_AMBULATORY_CARE_PROVIDER_SITE_OTHER): Payer: Medicare Other | Admitting: Internal Medicine

## 2015-02-26 ENCOUNTER — Encounter (INDEPENDENT_AMBULATORY_CARE_PROVIDER_SITE_OTHER): Payer: Self-pay | Admitting: *Deleted

## 2015-04-01 DIAGNOSIS — M1711 Unilateral primary osteoarthritis, right knee: Secondary | ICD-10-CM | POA: Diagnosis not present

## 2015-04-01 DIAGNOSIS — M47817 Spondylosis without myelopathy or radiculopathy, lumbosacral region: Secondary | ICD-10-CM | POA: Diagnosis not present

## 2015-04-01 DIAGNOSIS — M5136 Other intervertebral disc degeneration, lumbar region: Secondary | ICD-10-CM | POA: Diagnosis not present

## 2015-04-01 DIAGNOSIS — M4806 Spinal stenosis, lumbar region: Secondary | ICD-10-CM | POA: Diagnosis not present

## 2015-05-15 DIAGNOSIS — M47817 Spondylosis without myelopathy or radiculopathy, lumbosacral region: Secondary | ICD-10-CM | POA: Diagnosis not present

## 2015-05-15 DIAGNOSIS — M5136 Other intervertebral disc degeneration, lumbar region: Secondary | ICD-10-CM | POA: Diagnosis not present

## 2015-05-15 DIAGNOSIS — Z79891 Long term (current) use of opiate analgesic: Secondary | ICD-10-CM | POA: Diagnosis not present

## 2015-05-26 DIAGNOSIS — G2581 Restless legs syndrome: Secondary | ICD-10-CM | POA: Diagnosis not present

## 2015-05-26 DIAGNOSIS — N3000 Acute cystitis without hematuria: Secondary | ICD-10-CM | POA: Diagnosis not present

## 2015-05-26 DIAGNOSIS — Z720 Tobacco use: Secondary | ICD-10-CM | POA: Diagnosis not present

## 2015-05-26 DIAGNOSIS — M179 Osteoarthritis of knee, unspecified: Secondary | ICD-10-CM | POA: Diagnosis not present

## 2015-05-26 DIAGNOSIS — R413 Other amnesia: Secondary | ICD-10-CM | POA: Diagnosis not present

## 2015-05-26 DIAGNOSIS — M545 Low back pain: Secondary | ICD-10-CM | POA: Diagnosis not present

## 2015-05-26 DIAGNOSIS — R300 Dysuria: Secondary | ICD-10-CM | POA: Diagnosis not present

## 2015-06-12 DIAGNOSIS — N3090 Cystitis, unspecified without hematuria: Secondary | ICD-10-CM | POA: Diagnosis not present

## 2015-06-12 DIAGNOSIS — G2581 Restless legs syndrome: Secondary | ICD-10-CM | POA: Diagnosis not present

## 2015-06-12 DIAGNOSIS — S79912A Unspecified injury of left hip, initial encounter: Secondary | ICD-10-CM | POA: Diagnosis not present

## 2015-06-12 DIAGNOSIS — R300 Dysuria: Secondary | ICD-10-CM | POA: Diagnosis not present

## 2015-06-12 DIAGNOSIS — M545 Low back pain: Secondary | ICD-10-CM | POA: Diagnosis not present

## 2015-06-12 DIAGNOSIS — Z720 Tobacco use: Secondary | ICD-10-CM | POA: Diagnosis not present

## 2015-06-12 DIAGNOSIS — S9031XA Contusion of right foot, initial encounter: Secondary | ICD-10-CM | POA: Diagnosis not present

## 2015-06-12 DIAGNOSIS — T300 Burn of unspecified body region, unspecified degree: Secondary | ICD-10-CM | POA: Diagnosis not present

## 2015-06-12 DIAGNOSIS — N3000 Acute cystitis without hematuria: Secondary | ICD-10-CM | POA: Diagnosis not present

## 2015-06-12 DIAGNOSIS — M179 Osteoarthritis of knee, unspecified: Secondary | ICD-10-CM | POA: Diagnosis not present

## 2015-06-12 DIAGNOSIS — R413 Other amnesia: Secondary | ICD-10-CM | POA: Diagnosis not present

## 2015-06-12 DIAGNOSIS — R296 Repeated falls: Secondary | ICD-10-CM | POA: Diagnosis not present

## 2015-06-16 DIAGNOSIS — R42 Dizziness and giddiness: Secondary | ICD-10-CM | POA: Diagnosis not present

## 2015-06-16 DIAGNOSIS — M1711 Unilateral primary osteoarthritis, right knee: Secondary | ICD-10-CM | POA: Diagnosis not present

## 2015-06-16 DIAGNOSIS — M545 Low back pain: Secondary | ICD-10-CM | POA: Diagnosis not present

## 2015-06-16 DIAGNOSIS — G2581 Restless legs syndrome: Secondary | ICD-10-CM | POA: Diagnosis not present

## 2015-06-16 DIAGNOSIS — M159 Polyosteoarthritis, unspecified: Secondary | ICD-10-CM | POA: Diagnosis not present

## 2015-06-16 DIAGNOSIS — T2123XD Burn of second degree of upper back, subsequent encounter: Secondary | ICD-10-CM | POA: Diagnosis not present

## 2015-06-16 DIAGNOSIS — X16XXXD Contact with hot heating appliances, radiators and pipes, subsequent encounter: Secondary | ICD-10-CM | POA: Diagnosis not present

## 2015-06-16 DIAGNOSIS — K219 Gastro-esophageal reflux disease without esophagitis: Secondary | ICD-10-CM | POA: Diagnosis not present

## 2015-06-16 DIAGNOSIS — N183 Chronic kidney disease, stage 3 (moderate): Secondary | ICD-10-CM | POA: Diagnosis not present

## 2015-06-16 DIAGNOSIS — R413 Other amnesia: Secondary | ICD-10-CM | POA: Diagnosis not present

## 2015-06-17 DIAGNOSIS — R413 Other amnesia: Secondary | ICD-10-CM | POA: Diagnosis not present

## 2015-06-17 DIAGNOSIS — R42 Dizziness and giddiness: Secondary | ICD-10-CM | POA: Diagnosis not present

## 2015-06-17 DIAGNOSIS — X16XXXD Contact with hot heating appliances, radiators and pipes, subsequent encounter: Secondary | ICD-10-CM | POA: Diagnosis not present

## 2015-06-17 DIAGNOSIS — N183 Chronic kidney disease, stage 3 (moderate): Secondary | ICD-10-CM | POA: Diagnosis not present

## 2015-06-17 DIAGNOSIS — M1711 Unilateral primary osteoarthritis, right knee: Secondary | ICD-10-CM | POA: Diagnosis not present

## 2015-06-17 DIAGNOSIS — T2123XD Burn of second degree of upper back, subsequent encounter: Secondary | ICD-10-CM | POA: Diagnosis not present

## 2015-06-18 DIAGNOSIS — X16XXXD Contact with hot heating appliances, radiators and pipes, subsequent encounter: Secondary | ICD-10-CM | POA: Diagnosis not present

## 2015-06-18 DIAGNOSIS — R413 Other amnesia: Secondary | ICD-10-CM | POA: Diagnosis not present

## 2015-06-18 DIAGNOSIS — R42 Dizziness and giddiness: Secondary | ICD-10-CM | POA: Diagnosis not present

## 2015-06-18 DIAGNOSIS — M1711 Unilateral primary osteoarthritis, right knee: Secondary | ICD-10-CM | POA: Diagnosis not present

## 2015-06-18 DIAGNOSIS — T2123XD Burn of second degree of upper back, subsequent encounter: Secondary | ICD-10-CM | POA: Diagnosis not present

## 2015-06-18 DIAGNOSIS — N183 Chronic kidney disease, stage 3 (moderate): Secondary | ICD-10-CM | POA: Diagnosis not present

## 2015-06-19 DIAGNOSIS — M1711 Unilateral primary osteoarthritis, right knee: Secondary | ICD-10-CM | POA: Diagnosis not present

## 2015-06-19 DIAGNOSIS — R413 Other amnesia: Secondary | ICD-10-CM | POA: Diagnosis not present

## 2015-06-19 DIAGNOSIS — R42 Dizziness and giddiness: Secondary | ICD-10-CM | POA: Diagnosis not present

## 2015-06-19 DIAGNOSIS — X16XXXD Contact with hot heating appliances, radiators and pipes, subsequent encounter: Secondary | ICD-10-CM | POA: Diagnosis not present

## 2015-06-19 DIAGNOSIS — T2123XD Burn of second degree of upper back, subsequent encounter: Secondary | ICD-10-CM | POA: Diagnosis not present

## 2015-06-19 DIAGNOSIS — N183 Chronic kidney disease, stage 3 (moderate): Secondary | ICD-10-CM | POA: Diagnosis not present

## 2015-06-20 DIAGNOSIS — R413 Other amnesia: Secondary | ICD-10-CM | POA: Diagnosis not present

## 2015-06-20 DIAGNOSIS — M1711 Unilateral primary osteoarthritis, right knee: Secondary | ICD-10-CM | POA: Diagnosis not present

## 2015-06-20 DIAGNOSIS — X16XXXD Contact with hot heating appliances, radiators and pipes, subsequent encounter: Secondary | ICD-10-CM | POA: Diagnosis not present

## 2015-06-20 DIAGNOSIS — T2123XD Burn of second degree of upper back, subsequent encounter: Secondary | ICD-10-CM | POA: Diagnosis not present

## 2015-06-20 DIAGNOSIS — N183 Chronic kidney disease, stage 3 (moderate): Secondary | ICD-10-CM | POA: Diagnosis not present

## 2015-06-20 DIAGNOSIS — R42 Dizziness and giddiness: Secondary | ICD-10-CM | POA: Diagnosis not present

## 2015-06-23 DIAGNOSIS — X16XXXD Contact with hot heating appliances, radiators and pipes, subsequent encounter: Secondary | ICD-10-CM | POA: Diagnosis not present

## 2015-06-23 DIAGNOSIS — M1711 Unilateral primary osteoarthritis, right knee: Secondary | ICD-10-CM | POA: Diagnosis not present

## 2015-06-23 DIAGNOSIS — R413 Other amnesia: Secondary | ICD-10-CM | POA: Diagnosis not present

## 2015-06-23 DIAGNOSIS — N183 Chronic kidney disease, stage 3 (moderate): Secondary | ICD-10-CM | POA: Diagnosis not present

## 2015-06-23 DIAGNOSIS — T2123XD Burn of second degree of upper back, subsequent encounter: Secondary | ICD-10-CM | POA: Diagnosis not present

## 2015-06-23 DIAGNOSIS — R42 Dizziness and giddiness: Secondary | ICD-10-CM | POA: Diagnosis not present

## 2015-06-24 DIAGNOSIS — N183 Chronic kidney disease, stage 3 (moderate): Secondary | ICD-10-CM | POA: Diagnosis not present

## 2015-06-24 DIAGNOSIS — M1711 Unilateral primary osteoarthritis, right knee: Secondary | ICD-10-CM | POA: Diagnosis not present

## 2015-06-24 DIAGNOSIS — T2123XD Burn of second degree of upper back, subsequent encounter: Secondary | ICD-10-CM | POA: Diagnosis not present

## 2015-06-24 DIAGNOSIS — R413 Other amnesia: Secondary | ICD-10-CM | POA: Diagnosis not present

## 2015-06-24 DIAGNOSIS — X16XXXD Contact with hot heating appliances, radiators and pipes, subsequent encounter: Secondary | ICD-10-CM | POA: Diagnosis not present

## 2015-06-24 DIAGNOSIS — R42 Dizziness and giddiness: Secondary | ICD-10-CM | POA: Diagnosis not present

## 2015-06-25 DIAGNOSIS — R413 Other amnesia: Secondary | ICD-10-CM | POA: Diagnosis not present

## 2015-06-25 DIAGNOSIS — N183 Chronic kidney disease, stage 3 (moderate): Secondary | ICD-10-CM | POA: Diagnosis not present

## 2015-06-25 DIAGNOSIS — T2123XD Burn of second degree of upper back, subsequent encounter: Secondary | ICD-10-CM | POA: Diagnosis not present

## 2015-06-25 DIAGNOSIS — X16XXXD Contact with hot heating appliances, radiators and pipes, subsequent encounter: Secondary | ICD-10-CM | POA: Diagnosis not present

## 2015-06-25 DIAGNOSIS — M1711 Unilateral primary osteoarthritis, right knee: Secondary | ICD-10-CM | POA: Diagnosis not present

## 2015-06-25 DIAGNOSIS — R42 Dizziness and giddiness: Secondary | ICD-10-CM | POA: Diagnosis not present

## 2015-06-26 DIAGNOSIS — M1711 Unilateral primary osteoarthritis, right knee: Secondary | ICD-10-CM | POA: Diagnosis not present

## 2015-06-26 DIAGNOSIS — T2123XD Burn of second degree of upper back, subsequent encounter: Secondary | ICD-10-CM | POA: Diagnosis not present

## 2015-06-26 DIAGNOSIS — R413 Other amnesia: Secondary | ICD-10-CM | POA: Diagnosis not present

## 2015-06-26 DIAGNOSIS — X16XXXD Contact with hot heating appliances, radiators and pipes, subsequent encounter: Secondary | ICD-10-CM | POA: Diagnosis not present

## 2015-06-26 DIAGNOSIS — N183 Chronic kidney disease, stage 3 (moderate): Secondary | ICD-10-CM | POA: Diagnosis not present

## 2015-06-26 DIAGNOSIS — R42 Dizziness and giddiness: Secondary | ICD-10-CM | POA: Diagnosis not present

## 2015-06-27 DIAGNOSIS — T2123XD Burn of second degree of upper back, subsequent encounter: Secondary | ICD-10-CM | POA: Diagnosis not present

## 2015-06-27 DIAGNOSIS — R42 Dizziness and giddiness: Secondary | ICD-10-CM | POA: Diagnosis not present

## 2015-06-27 DIAGNOSIS — N183 Chronic kidney disease, stage 3 (moderate): Secondary | ICD-10-CM | POA: Diagnosis not present

## 2015-06-27 DIAGNOSIS — R413 Other amnesia: Secondary | ICD-10-CM | POA: Diagnosis not present

## 2015-06-27 DIAGNOSIS — M1711 Unilateral primary osteoarthritis, right knee: Secondary | ICD-10-CM | POA: Diagnosis not present

## 2015-06-27 DIAGNOSIS — X16XXXD Contact with hot heating appliances, radiators and pipes, subsequent encounter: Secondary | ICD-10-CM | POA: Diagnosis not present

## 2015-06-30 DIAGNOSIS — N183 Chronic kidney disease, stage 3 (moderate): Secondary | ICD-10-CM | POA: Diagnosis not present

## 2015-06-30 DIAGNOSIS — T2123XD Burn of second degree of upper back, subsequent encounter: Secondary | ICD-10-CM | POA: Diagnosis not present

## 2015-06-30 DIAGNOSIS — R42 Dizziness and giddiness: Secondary | ICD-10-CM | POA: Diagnosis not present

## 2015-06-30 DIAGNOSIS — R413 Other amnesia: Secondary | ICD-10-CM | POA: Diagnosis not present

## 2015-06-30 DIAGNOSIS — X16XXXD Contact with hot heating appliances, radiators and pipes, subsequent encounter: Secondary | ICD-10-CM | POA: Diagnosis not present

## 2015-06-30 DIAGNOSIS — M1711 Unilateral primary osteoarthritis, right knee: Secondary | ICD-10-CM | POA: Diagnosis not present

## 2015-07-01 DIAGNOSIS — T2123XD Burn of second degree of upper back, subsequent encounter: Secondary | ICD-10-CM | POA: Diagnosis not present

## 2015-07-01 DIAGNOSIS — M1711 Unilateral primary osteoarthritis, right knee: Secondary | ICD-10-CM | POA: Diagnosis not present

## 2015-07-01 DIAGNOSIS — R413 Other amnesia: Secondary | ICD-10-CM | POA: Diagnosis not present

## 2015-07-01 DIAGNOSIS — R42 Dizziness and giddiness: Secondary | ICD-10-CM | POA: Diagnosis not present

## 2015-07-01 DIAGNOSIS — N183 Chronic kidney disease, stage 3 (moderate): Secondary | ICD-10-CM | POA: Diagnosis not present

## 2015-07-01 DIAGNOSIS — X16XXXD Contact with hot heating appliances, radiators and pipes, subsequent encounter: Secondary | ICD-10-CM | POA: Diagnosis not present

## 2015-07-02 DIAGNOSIS — T2123XD Burn of second degree of upper back, subsequent encounter: Secondary | ICD-10-CM | POA: Diagnosis not present

## 2015-07-02 DIAGNOSIS — R42 Dizziness and giddiness: Secondary | ICD-10-CM | POA: Diagnosis not present

## 2015-07-02 DIAGNOSIS — R413 Other amnesia: Secondary | ICD-10-CM | POA: Diagnosis not present

## 2015-07-02 DIAGNOSIS — M1711 Unilateral primary osteoarthritis, right knee: Secondary | ICD-10-CM | POA: Diagnosis not present

## 2015-07-02 DIAGNOSIS — X16XXXD Contact with hot heating appliances, radiators and pipes, subsequent encounter: Secondary | ICD-10-CM | POA: Diagnosis not present

## 2015-07-02 DIAGNOSIS — N183 Chronic kidney disease, stage 3 (moderate): Secondary | ICD-10-CM | POA: Diagnosis not present

## 2015-07-03 DIAGNOSIS — X16XXXD Contact with hot heating appliances, radiators and pipes, subsequent encounter: Secondary | ICD-10-CM | POA: Diagnosis not present

## 2015-07-03 DIAGNOSIS — N3090 Cystitis, unspecified without hematuria: Secondary | ICD-10-CM | POA: Diagnosis not present

## 2015-07-03 DIAGNOSIS — K219 Gastro-esophageal reflux disease without esophagitis: Secondary | ICD-10-CM | POA: Diagnosis not present

## 2015-07-03 DIAGNOSIS — N183 Chronic kidney disease, stage 3 (moderate): Secondary | ICD-10-CM | POA: Diagnosis not present

## 2015-07-03 DIAGNOSIS — T2123XD Burn of second degree of upper back, subsequent encounter: Secondary | ICD-10-CM | POA: Diagnosis not present

## 2015-07-03 DIAGNOSIS — R413 Other amnesia: Secondary | ICD-10-CM | POA: Diagnosis not present

## 2015-07-03 DIAGNOSIS — N3000 Acute cystitis without hematuria: Secondary | ICD-10-CM | POA: Diagnosis not present

## 2015-07-03 DIAGNOSIS — R300 Dysuria: Secondary | ICD-10-CM | POA: Diagnosis not present

## 2015-07-03 DIAGNOSIS — S9031XA Contusion of right foot, initial encounter: Secondary | ICD-10-CM | POA: Diagnosis not present

## 2015-07-03 DIAGNOSIS — M545 Low back pain: Secondary | ICD-10-CM | POA: Diagnosis not present

## 2015-07-03 DIAGNOSIS — M1711 Unilateral primary osteoarthritis, right knee: Secondary | ICD-10-CM | POA: Diagnosis not present

## 2015-07-03 DIAGNOSIS — G2581 Restless legs syndrome: Secondary | ICD-10-CM | POA: Diagnosis not present

## 2015-07-03 DIAGNOSIS — M179 Osteoarthritis of knee, unspecified: Secondary | ICD-10-CM | POA: Diagnosis not present

## 2015-07-03 DIAGNOSIS — S79912A Unspecified injury of left hip, initial encounter: Secondary | ICD-10-CM | POA: Diagnosis not present

## 2015-07-03 DIAGNOSIS — R296 Repeated falls: Secondary | ICD-10-CM | POA: Diagnosis not present

## 2015-07-03 DIAGNOSIS — R42 Dizziness and giddiness: Secondary | ICD-10-CM | POA: Diagnosis not present

## 2015-07-03 DIAGNOSIS — F0390 Unspecified dementia without behavioral disturbance: Secondary | ICD-10-CM | POA: Diagnosis not present

## 2015-07-07 DIAGNOSIS — T2123XD Burn of second degree of upper back, subsequent encounter: Secondary | ICD-10-CM | POA: Diagnosis not present

## 2015-07-07 DIAGNOSIS — M1711 Unilateral primary osteoarthritis, right knee: Secondary | ICD-10-CM | POA: Diagnosis not present

## 2015-07-07 DIAGNOSIS — R413 Other amnesia: Secondary | ICD-10-CM | POA: Diagnosis not present

## 2015-07-07 DIAGNOSIS — X16XXXD Contact with hot heating appliances, radiators and pipes, subsequent encounter: Secondary | ICD-10-CM | POA: Diagnosis not present

## 2015-07-07 DIAGNOSIS — R42 Dizziness and giddiness: Secondary | ICD-10-CM | POA: Diagnosis not present

## 2015-07-07 DIAGNOSIS — N183 Chronic kidney disease, stage 3 (moderate): Secondary | ICD-10-CM | POA: Diagnosis not present

## 2015-07-09 DIAGNOSIS — X16XXXD Contact with hot heating appliances, radiators and pipes, subsequent encounter: Secondary | ICD-10-CM | POA: Diagnosis not present

## 2015-07-09 DIAGNOSIS — N183 Chronic kidney disease, stage 3 (moderate): Secondary | ICD-10-CM | POA: Diagnosis not present

## 2015-07-09 DIAGNOSIS — M1711 Unilateral primary osteoarthritis, right knee: Secondary | ICD-10-CM | POA: Diagnosis not present

## 2015-07-09 DIAGNOSIS — R413 Other amnesia: Secondary | ICD-10-CM | POA: Diagnosis not present

## 2015-07-09 DIAGNOSIS — R42 Dizziness and giddiness: Secondary | ICD-10-CM | POA: Diagnosis not present

## 2015-07-09 DIAGNOSIS — T2123XD Burn of second degree of upper back, subsequent encounter: Secondary | ICD-10-CM | POA: Diagnosis not present

## 2015-07-15 DIAGNOSIS — R413 Other amnesia: Secondary | ICD-10-CM | POA: Diagnosis not present

## 2015-07-15 DIAGNOSIS — R42 Dizziness and giddiness: Secondary | ICD-10-CM | POA: Diagnosis not present

## 2015-07-15 DIAGNOSIS — X16XXXD Contact with hot heating appliances, radiators and pipes, subsequent encounter: Secondary | ICD-10-CM | POA: Diagnosis not present

## 2015-07-15 DIAGNOSIS — T2123XD Burn of second degree of upper back, subsequent encounter: Secondary | ICD-10-CM | POA: Diagnosis not present

## 2015-07-15 DIAGNOSIS — N183 Chronic kidney disease, stage 3 (moderate): Secondary | ICD-10-CM | POA: Diagnosis not present

## 2015-07-15 DIAGNOSIS — M1711 Unilateral primary osteoarthritis, right knee: Secondary | ICD-10-CM | POA: Diagnosis not present

## 2015-07-18 DIAGNOSIS — X16XXXD Contact with hot heating appliances, radiators and pipes, subsequent encounter: Secondary | ICD-10-CM | POA: Diagnosis not present

## 2015-07-18 DIAGNOSIS — R413 Other amnesia: Secondary | ICD-10-CM | POA: Diagnosis not present

## 2015-07-18 DIAGNOSIS — M1711 Unilateral primary osteoarthritis, right knee: Secondary | ICD-10-CM | POA: Diagnosis not present

## 2015-07-18 DIAGNOSIS — N183 Chronic kidney disease, stage 3 (moderate): Secondary | ICD-10-CM | POA: Diagnosis not present

## 2015-07-18 DIAGNOSIS — R42 Dizziness and giddiness: Secondary | ICD-10-CM | POA: Diagnosis not present

## 2015-07-18 DIAGNOSIS — T2123XD Burn of second degree of upper back, subsequent encounter: Secondary | ICD-10-CM | POA: Diagnosis not present

## 2015-07-21 DIAGNOSIS — R413 Other amnesia: Secondary | ICD-10-CM | POA: Diagnosis not present

## 2015-07-21 DIAGNOSIS — T2123XD Burn of second degree of upper back, subsequent encounter: Secondary | ICD-10-CM | POA: Diagnosis not present

## 2015-07-21 DIAGNOSIS — R42 Dizziness and giddiness: Secondary | ICD-10-CM | POA: Diagnosis not present

## 2015-07-21 DIAGNOSIS — M1711 Unilateral primary osteoarthritis, right knee: Secondary | ICD-10-CM | POA: Diagnosis not present

## 2015-07-21 DIAGNOSIS — N183 Chronic kidney disease, stage 3 (moderate): Secondary | ICD-10-CM | POA: Diagnosis not present

## 2015-07-21 DIAGNOSIS — X16XXXD Contact with hot heating appliances, radiators and pipes, subsequent encounter: Secondary | ICD-10-CM | POA: Diagnosis not present

## 2015-07-28 DIAGNOSIS — R413 Other amnesia: Secondary | ICD-10-CM | POA: Diagnosis not present

## 2015-07-28 DIAGNOSIS — M1711 Unilateral primary osteoarthritis, right knee: Secondary | ICD-10-CM | POA: Diagnosis not present

## 2015-07-28 DIAGNOSIS — N183 Chronic kidney disease, stage 3 (moderate): Secondary | ICD-10-CM | POA: Diagnosis not present

## 2015-07-28 DIAGNOSIS — X16XXXD Contact with hot heating appliances, radiators and pipes, subsequent encounter: Secondary | ICD-10-CM | POA: Diagnosis not present

## 2015-07-28 DIAGNOSIS — R42 Dizziness and giddiness: Secondary | ICD-10-CM | POA: Diagnosis not present

## 2015-07-28 DIAGNOSIS — T2123XD Burn of second degree of upper back, subsequent encounter: Secondary | ICD-10-CM | POA: Diagnosis not present

## 2015-08-12 DIAGNOSIS — M130 Polyarthritis, unspecified: Secondary | ICD-10-CM | POA: Diagnosis not present

## 2015-08-12 DIAGNOSIS — R269 Unspecified abnormalities of gait and mobility: Secondary | ICD-10-CM | POA: Diagnosis not present

## 2015-08-12 DIAGNOSIS — G2581 Restless legs syndrome: Secondary | ICD-10-CM | POA: Diagnosis not present

## 2015-08-12 DIAGNOSIS — R251 Tremor, unspecified: Secondary | ICD-10-CM | POA: Diagnosis not present

## 2015-08-12 DIAGNOSIS — F0390 Unspecified dementia without behavioral disturbance: Secondary | ICD-10-CM | POA: Diagnosis not present

## 2015-08-13 ENCOUNTER — Other Ambulatory Visit: Payer: Self-pay | Admitting: Neurology

## 2015-08-13 DIAGNOSIS — F0390 Unspecified dementia without behavioral disturbance: Secondary | ICD-10-CM

## 2015-08-21 ENCOUNTER — Ambulatory Visit (HOSPITAL_COMMUNITY): Payer: Medicare Other

## 2015-08-27 DIAGNOSIS — R195 Other fecal abnormalities: Secondary | ICD-10-CM | POA: Diagnosis not present

## 2015-08-27 DIAGNOSIS — B0229 Other postherpetic nervous system involvement: Secondary | ICD-10-CM | POA: Diagnosis not present

## 2015-08-27 DIAGNOSIS — M545 Low back pain: Secondary | ICD-10-CM | POA: Diagnosis not present

## 2015-08-27 DIAGNOSIS — K219 Gastro-esophageal reflux disease without esophagitis: Secondary | ICD-10-CM | POA: Diagnosis not present

## 2015-09-11 DIAGNOSIS — M545 Low back pain: Secondary | ICD-10-CM | POA: Diagnosis not present

## 2015-09-11 DIAGNOSIS — R413 Other amnesia: Secondary | ICD-10-CM | POA: Diagnosis not present

## 2015-09-11 DIAGNOSIS — F0390 Unspecified dementia without behavioral disturbance: Secondary | ICD-10-CM | POA: Diagnosis not present

## 2015-09-11 DIAGNOSIS — N183 Chronic kidney disease, stage 3 (moderate): Secondary | ICD-10-CM | POA: Diagnosis not present

## 2015-09-11 DIAGNOSIS — R195 Other fecal abnormalities: Secondary | ICD-10-CM | POA: Diagnosis not present

## 2015-09-11 DIAGNOSIS — K219 Gastro-esophageal reflux disease without esophagitis: Secondary | ICD-10-CM | POA: Diagnosis not present

## 2015-09-12 DIAGNOSIS — I129 Hypertensive chronic kidney disease with stage 1 through stage 4 chronic kidney disease, or unspecified chronic kidney disease: Secondary | ICD-10-CM | POA: Diagnosis not present

## 2015-09-12 DIAGNOSIS — K219 Gastro-esophageal reflux disease without esophagitis: Secondary | ICD-10-CM | POA: Diagnosis not present

## 2015-09-12 DIAGNOSIS — M1711 Unilateral primary osteoarthritis, right knee: Secondary | ICD-10-CM | POA: Diagnosis not present

## 2015-09-12 DIAGNOSIS — J449 Chronic obstructive pulmonary disease, unspecified: Secondary | ICD-10-CM | POA: Diagnosis not present

## 2015-09-12 DIAGNOSIS — G894 Chronic pain syndrome: Secondary | ICD-10-CM | POA: Diagnosis not present

## 2015-09-12 DIAGNOSIS — B0229 Other postherpetic nervous system involvement: Secondary | ICD-10-CM | POA: Diagnosis not present

## 2015-09-12 DIAGNOSIS — F17219 Nicotine dependence, cigarettes, with unspecified nicotine-induced disorders: Secondary | ICD-10-CM | POA: Diagnosis not present

## 2015-09-12 DIAGNOSIS — N183 Chronic kidney disease, stage 3 (moderate): Secondary | ICD-10-CM | POA: Diagnosis not present

## 2015-09-12 DIAGNOSIS — F0280 Dementia in other diseases classified elsewhere without behavioral disturbance: Secondary | ICD-10-CM | POA: Diagnosis not present

## 2015-09-12 DIAGNOSIS — M545 Low back pain: Secondary | ICD-10-CM | POA: Diagnosis not present

## 2015-09-15 DIAGNOSIS — M1711 Unilateral primary osteoarthritis, right knee: Secondary | ICD-10-CM | POA: Diagnosis not present

## 2015-09-15 DIAGNOSIS — M545 Low back pain: Secondary | ICD-10-CM | POA: Diagnosis not present

## 2015-09-15 DIAGNOSIS — N183 Chronic kidney disease, stage 3 (moderate): Secondary | ICD-10-CM | POA: Diagnosis not present

## 2015-09-15 DIAGNOSIS — F0280 Dementia in other diseases classified elsewhere without behavioral disturbance: Secondary | ICD-10-CM | POA: Diagnosis not present

## 2015-09-15 DIAGNOSIS — J449 Chronic obstructive pulmonary disease, unspecified: Secondary | ICD-10-CM | POA: Diagnosis not present

## 2015-09-15 DIAGNOSIS — I129 Hypertensive chronic kidney disease with stage 1 through stage 4 chronic kidney disease, or unspecified chronic kidney disease: Secondary | ICD-10-CM | POA: Diagnosis not present

## 2015-09-17 DIAGNOSIS — N183 Chronic kidney disease, stage 3 (moderate): Secondary | ICD-10-CM | POA: Diagnosis not present

## 2015-09-17 DIAGNOSIS — M545 Low back pain: Secondary | ICD-10-CM | POA: Diagnosis not present

## 2015-09-17 DIAGNOSIS — M1711 Unilateral primary osteoarthritis, right knee: Secondary | ICD-10-CM | POA: Diagnosis not present

## 2015-09-17 DIAGNOSIS — F0280 Dementia in other diseases classified elsewhere without behavioral disturbance: Secondary | ICD-10-CM | POA: Diagnosis not present

## 2015-09-17 DIAGNOSIS — J449 Chronic obstructive pulmonary disease, unspecified: Secondary | ICD-10-CM | POA: Diagnosis not present

## 2015-09-17 DIAGNOSIS — I129 Hypertensive chronic kidney disease with stage 1 through stage 4 chronic kidney disease, or unspecified chronic kidney disease: Secondary | ICD-10-CM | POA: Diagnosis not present

## 2015-09-18 DIAGNOSIS — M545 Low back pain: Secondary | ICD-10-CM | POA: Diagnosis not present

## 2015-09-18 DIAGNOSIS — I129 Hypertensive chronic kidney disease with stage 1 through stage 4 chronic kidney disease, or unspecified chronic kidney disease: Secondary | ICD-10-CM | POA: Diagnosis not present

## 2015-09-18 DIAGNOSIS — M1711 Unilateral primary osteoarthritis, right knee: Secondary | ICD-10-CM | POA: Diagnosis not present

## 2015-09-18 DIAGNOSIS — F0280 Dementia in other diseases classified elsewhere without behavioral disturbance: Secondary | ICD-10-CM | POA: Diagnosis not present

## 2015-09-18 DIAGNOSIS — N183 Chronic kidney disease, stage 3 (moderate): Secondary | ICD-10-CM | POA: Diagnosis not present

## 2015-09-18 DIAGNOSIS — J449 Chronic obstructive pulmonary disease, unspecified: Secondary | ICD-10-CM | POA: Diagnosis not present

## 2015-09-19 DIAGNOSIS — M1711 Unilateral primary osteoarthritis, right knee: Secondary | ICD-10-CM | POA: Diagnosis not present

## 2015-09-19 DIAGNOSIS — I129 Hypertensive chronic kidney disease with stage 1 through stage 4 chronic kidney disease, or unspecified chronic kidney disease: Secondary | ICD-10-CM | POA: Diagnosis not present

## 2015-09-19 DIAGNOSIS — N183 Chronic kidney disease, stage 3 (moderate): Secondary | ICD-10-CM | POA: Diagnosis not present

## 2015-09-19 DIAGNOSIS — J449 Chronic obstructive pulmonary disease, unspecified: Secondary | ICD-10-CM | POA: Diagnosis not present

## 2015-09-19 DIAGNOSIS — M545 Low back pain: Secondary | ICD-10-CM | POA: Diagnosis not present

## 2015-09-19 DIAGNOSIS — F0280 Dementia in other diseases classified elsewhere without behavioral disturbance: Secondary | ICD-10-CM | POA: Diagnosis not present

## 2015-09-21 DIAGNOSIS — M1711 Unilateral primary osteoarthritis, right knee: Secondary | ICD-10-CM | POA: Diagnosis not present

## 2015-09-21 DIAGNOSIS — J449 Chronic obstructive pulmonary disease, unspecified: Secondary | ICD-10-CM | POA: Diagnosis not present

## 2015-09-21 DIAGNOSIS — F0280 Dementia in other diseases classified elsewhere without behavioral disturbance: Secondary | ICD-10-CM | POA: Diagnosis not present

## 2015-09-21 DIAGNOSIS — M545 Low back pain: Secondary | ICD-10-CM | POA: Diagnosis not present

## 2015-09-21 DIAGNOSIS — N183 Chronic kidney disease, stage 3 (moderate): Secondary | ICD-10-CM | POA: Diagnosis not present

## 2015-09-21 DIAGNOSIS — I129 Hypertensive chronic kidney disease with stage 1 through stage 4 chronic kidney disease, or unspecified chronic kidney disease: Secondary | ICD-10-CM | POA: Diagnosis not present

## 2015-09-22 DIAGNOSIS — M1711 Unilateral primary osteoarthritis, right knee: Secondary | ICD-10-CM | POA: Diagnosis not present

## 2015-09-22 DIAGNOSIS — J449 Chronic obstructive pulmonary disease, unspecified: Secondary | ICD-10-CM | POA: Diagnosis not present

## 2015-09-22 DIAGNOSIS — I129 Hypertensive chronic kidney disease with stage 1 through stage 4 chronic kidney disease, or unspecified chronic kidney disease: Secondary | ICD-10-CM | POA: Diagnosis not present

## 2015-09-22 DIAGNOSIS — M545 Low back pain: Secondary | ICD-10-CM | POA: Diagnosis not present

## 2015-09-22 DIAGNOSIS — N183 Chronic kidney disease, stage 3 (moderate): Secondary | ICD-10-CM | POA: Diagnosis not present

## 2015-09-22 DIAGNOSIS — F0280 Dementia in other diseases classified elsewhere without behavioral disturbance: Secondary | ICD-10-CM | POA: Diagnosis not present

## 2015-09-23 DIAGNOSIS — J449 Chronic obstructive pulmonary disease, unspecified: Secondary | ICD-10-CM | POA: Diagnosis not present

## 2015-09-23 DIAGNOSIS — I129 Hypertensive chronic kidney disease with stage 1 through stage 4 chronic kidney disease, or unspecified chronic kidney disease: Secondary | ICD-10-CM | POA: Diagnosis not present

## 2015-09-23 DIAGNOSIS — M1711 Unilateral primary osteoarthritis, right knee: Secondary | ICD-10-CM | POA: Diagnosis not present

## 2015-09-23 DIAGNOSIS — F0280 Dementia in other diseases classified elsewhere without behavioral disturbance: Secondary | ICD-10-CM | POA: Diagnosis not present

## 2015-09-23 DIAGNOSIS — M545 Low back pain: Secondary | ICD-10-CM | POA: Diagnosis not present

## 2015-09-23 DIAGNOSIS — N183 Chronic kidney disease, stage 3 (moderate): Secondary | ICD-10-CM | POA: Diagnosis not present

## 2015-09-24 DIAGNOSIS — F0280 Dementia in other diseases classified elsewhere without behavioral disturbance: Secondary | ICD-10-CM | POA: Diagnosis not present

## 2015-09-24 DIAGNOSIS — I129 Hypertensive chronic kidney disease with stage 1 through stage 4 chronic kidney disease, or unspecified chronic kidney disease: Secondary | ICD-10-CM | POA: Diagnosis not present

## 2015-09-24 DIAGNOSIS — J449 Chronic obstructive pulmonary disease, unspecified: Secondary | ICD-10-CM | POA: Diagnosis not present

## 2015-09-24 DIAGNOSIS — M1711 Unilateral primary osteoarthritis, right knee: Secondary | ICD-10-CM | POA: Diagnosis not present

## 2015-09-24 DIAGNOSIS — N183 Chronic kidney disease, stage 3 (moderate): Secondary | ICD-10-CM | POA: Diagnosis not present

## 2015-09-24 DIAGNOSIS — M545 Low back pain: Secondary | ICD-10-CM | POA: Diagnosis not present

## 2015-09-25 DIAGNOSIS — Z139 Encounter for screening, unspecified: Secondary | ICD-10-CM | POA: Diagnosis not present

## 2015-09-25 DIAGNOSIS — F0390 Unspecified dementia without behavioral disturbance: Secondary | ICD-10-CM | POA: Diagnosis not present

## 2015-09-25 DIAGNOSIS — R195 Other fecal abnormalities: Secondary | ICD-10-CM | POA: Diagnosis not present

## 2015-09-25 DIAGNOSIS — G4700 Insomnia, unspecified: Secondary | ICD-10-CM | POA: Diagnosis not present

## 2015-09-25 DIAGNOSIS — Z125 Encounter for screening for malignant neoplasm of prostate: Secondary | ICD-10-CM | POA: Diagnosis not present

## 2015-09-25 DIAGNOSIS — N183 Chronic kidney disease, stage 3 (moderate): Secondary | ICD-10-CM | POA: Diagnosis not present

## 2015-09-29 DIAGNOSIS — F0280 Dementia in other diseases classified elsewhere without behavioral disturbance: Secondary | ICD-10-CM | POA: Diagnosis not present

## 2015-09-29 DIAGNOSIS — M1711 Unilateral primary osteoarthritis, right knee: Secondary | ICD-10-CM | POA: Diagnosis not present

## 2015-09-29 DIAGNOSIS — N183 Chronic kidney disease, stage 3 (moderate): Secondary | ICD-10-CM | POA: Diagnosis not present

## 2015-09-29 DIAGNOSIS — I129 Hypertensive chronic kidney disease with stage 1 through stage 4 chronic kidney disease, or unspecified chronic kidney disease: Secondary | ICD-10-CM | POA: Diagnosis not present

## 2015-09-29 DIAGNOSIS — J449 Chronic obstructive pulmonary disease, unspecified: Secondary | ICD-10-CM | POA: Diagnosis not present

## 2015-09-29 DIAGNOSIS — M545 Low back pain: Secondary | ICD-10-CM | POA: Diagnosis not present

## 2015-09-30 DIAGNOSIS — M545 Low back pain: Secondary | ICD-10-CM | POA: Diagnosis not present

## 2015-09-30 DIAGNOSIS — M1711 Unilateral primary osteoarthritis, right knee: Secondary | ICD-10-CM | POA: Diagnosis not present

## 2015-09-30 DIAGNOSIS — F0280 Dementia in other diseases classified elsewhere without behavioral disturbance: Secondary | ICD-10-CM | POA: Diagnosis not present

## 2015-09-30 DIAGNOSIS — N183 Chronic kidney disease, stage 3 (moderate): Secondary | ICD-10-CM | POA: Diagnosis not present

## 2015-09-30 DIAGNOSIS — I129 Hypertensive chronic kidney disease with stage 1 through stage 4 chronic kidney disease, or unspecified chronic kidney disease: Secondary | ICD-10-CM | POA: Diagnosis not present

## 2015-09-30 DIAGNOSIS — J449 Chronic obstructive pulmonary disease, unspecified: Secondary | ICD-10-CM | POA: Diagnosis not present

## 2015-10-01 DIAGNOSIS — J449 Chronic obstructive pulmonary disease, unspecified: Secondary | ICD-10-CM | POA: Diagnosis not present

## 2015-10-01 DIAGNOSIS — I129 Hypertensive chronic kidney disease with stage 1 through stage 4 chronic kidney disease, or unspecified chronic kidney disease: Secondary | ICD-10-CM | POA: Diagnosis not present

## 2015-10-01 DIAGNOSIS — N183 Chronic kidney disease, stage 3 (moderate): Secondary | ICD-10-CM | POA: Diagnosis not present

## 2015-10-01 DIAGNOSIS — M1711 Unilateral primary osteoarthritis, right knee: Secondary | ICD-10-CM | POA: Diagnosis not present

## 2015-10-01 DIAGNOSIS — F0280 Dementia in other diseases classified elsewhere without behavioral disturbance: Secondary | ICD-10-CM | POA: Diagnosis not present

## 2015-10-01 DIAGNOSIS — M545 Low back pain: Secondary | ICD-10-CM | POA: Diagnosis not present

## 2015-10-02 DIAGNOSIS — N183 Chronic kidney disease, stage 3 (moderate): Secondary | ICD-10-CM | POA: Diagnosis not present

## 2015-10-02 DIAGNOSIS — J449 Chronic obstructive pulmonary disease, unspecified: Secondary | ICD-10-CM | POA: Diagnosis not present

## 2015-10-02 DIAGNOSIS — F0280 Dementia in other diseases classified elsewhere without behavioral disturbance: Secondary | ICD-10-CM | POA: Diagnosis not present

## 2015-10-02 DIAGNOSIS — I129 Hypertensive chronic kidney disease with stage 1 through stage 4 chronic kidney disease, or unspecified chronic kidney disease: Secondary | ICD-10-CM | POA: Diagnosis not present

## 2015-10-02 DIAGNOSIS — M545 Low back pain: Secondary | ICD-10-CM | POA: Diagnosis not present

## 2015-10-02 DIAGNOSIS — M1711 Unilateral primary osteoarthritis, right knee: Secondary | ICD-10-CM | POA: Diagnosis not present

## 2015-10-03 DIAGNOSIS — M545 Low back pain: Secondary | ICD-10-CM | POA: Diagnosis not present

## 2015-10-03 DIAGNOSIS — I129 Hypertensive chronic kidney disease with stage 1 through stage 4 chronic kidney disease, or unspecified chronic kidney disease: Secondary | ICD-10-CM | POA: Diagnosis not present

## 2015-10-03 DIAGNOSIS — N183 Chronic kidney disease, stage 3 (moderate): Secondary | ICD-10-CM | POA: Diagnosis not present

## 2015-10-03 DIAGNOSIS — J449 Chronic obstructive pulmonary disease, unspecified: Secondary | ICD-10-CM | POA: Diagnosis not present

## 2015-10-03 DIAGNOSIS — F0280 Dementia in other diseases classified elsewhere without behavioral disturbance: Secondary | ICD-10-CM | POA: Diagnosis not present

## 2015-10-03 DIAGNOSIS — M1711 Unilateral primary osteoarthritis, right knee: Secondary | ICD-10-CM | POA: Diagnosis not present

## 2015-10-05 DIAGNOSIS — M1711 Unilateral primary osteoarthritis, right knee: Secondary | ICD-10-CM | POA: Diagnosis not present

## 2015-10-05 DIAGNOSIS — J449 Chronic obstructive pulmonary disease, unspecified: Secondary | ICD-10-CM | POA: Diagnosis not present

## 2015-10-05 DIAGNOSIS — M545 Low back pain: Secondary | ICD-10-CM | POA: Diagnosis not present

## 2015-10-05 DIAGNOSIS — N183 Chronic kidney disease, stage 3 (moderate): Secondary | ICD-10-CM | POA: Diagnosis not present

## 2015-10-05 DIAGNOSIS — I129 Hypertensive chronic kidney disease with stage 1 through stage 4 chronic kidney disease, or unspecified chronic kidney disease: Secondary | ICD-10-CM | POA: Diagnosis not present

## 2015-10-05 DIAGNOSIS — F0280 Dementia in other diseases classified elsewhere without behavioral disturbance: Secondary | ICD-10-CM | POA: Diagnosis not present

## 2015-10-07 DIAGNOSIS — M1711 Unilateral primary osteoarthritis, right knee: Secondary | ICD-10-CM | POA: Diagnosis not present

## 2015-10-07 DIAGNOSIS — M545 Low back pain: Secondary | ICD-10-CM | POA: Diagnosis not present

## 2015-10-07 DIAGNOSIS — I129 Hypertensive chronic kidney disease with stage 1 through stage 4 chronic kidney disease, or unspecified chronic kidney disease: Secondary | ICD-10-CM | POA: Diagnosis not present

## 2015-10-07 DIAGNOSIS — F0280 Dementia in other diseases classified elsewhere without behavioral disturbance: Secondary | ICD-10-CM | POA: Diagnosis not present

## 2015-10-07 DIAGNOSIS — N183 Chronic kidney disease, stage 3 (moderate): Secondary | ICD-10-CM | POA: Diagnosis not present

## 2015-10-07 DIAGNOSIS — J449 Chronic obstructive pulmonary disease, unspecified: Secondary | ICD-10-CM | POA: Diagnosis not present

## 2015-10-08 DIAGNOSIS — N183 Chronic kidney disease, stage 3 (moderate): Secondary | ICD-10-CM | POA: Diagnosis not present

## 2015-10-08 DIAGNOSIS — I129 Hypertensive chronic kidney disease with stage 1 through stage 4 chronic kidney disease, or unspecified chronic kidney disease: Secondary | ICD-10-CM | POA: Diagnosis not present

## 2015-10-08 DIAGNOSIS — F0280 Dementia in other diseases classified elsewhere without behavioral disturbance: Secondary | ICD-10-CM | POA: Diagnosis not present

## 2015-10-08 DIAGNOSIS — J449 Chronic obstructive pulmonary disease, unspecified: Secondary | ICD-10-CM | POA: Diagnosis not present

## 2015-10-08 DIAGNOSIS — M545 Low back pain: Secondary | ICD-10-CM | POA: Diagnosis not present

## 2015-10-08 DIAGNOSIS — M1711 Unilateral primary osteoarthritis, right knee: Secondary | ICD-10-CM | POA: Diagnosis not present

## 2015-11-27 DIAGNOSIS — R195 Other fecal abnormalities: Secondary | ICD-10-CM | POA: Diagnosis not present

## 2015-11-27 DIAGNOSIS — M545 Low back pain: Secondary | ICD-10-CM | POA: Diagnosis not present

## 2015-11-27 DIAGNOSIS — G4700 Insomnia, unspecified: Secondary | ICD-10-CM | POA: Diagnosis not present

## 2015-11-27 DIAGNOSIS — F0390 Unspecified dementia without behavioral disturbance: Secondary | ICD-10-CM | POA: Diagnosis not present

## 2015-11-27 DIAGNOSIS — N183 Chronic kidney disease, stage 3 (moderate): Secondary | ICD-10-CM | POA: Diagnosis not present

## 2015-12-25 DIAGNOSIS — Z131 Encounter for screening for diabetes mellitus: Secondary | ICD-10-CM | POA: Diagnosis not present

## 2015-12-25 DIAGNOSIS — J449 Chronic obstructive pulmonary disease, unspecified: Secondary | ICD-10-CM | POA: Diagnosis not present

## 2015-12-25 DIAGNOSIS — N183 Chronic kidney disease, stage 3 (moderate): Secondary | ICD-10-CM | POA: Diagnosis not present

## 2015-12-25 DIAGNOSIS — G4700 Insomnia, unspecified: Secondary | ICD-10-CM | POA: Diagnosis not present

## 2015-12-25 DIAGNOSIS — F0390 Unspecified dementia without behavioral disturbance: Secondary | ICD-10-CM | POA: Diagnosis not present

## 2015-12-25 DIAGNOSIS — M545 Low back pain: Secondary | ICD-10-CM | POA: Diagnosis not present

## 2015-12-25 DIAGNOSIS — R195 Other fecal abnormalities: Secondary | ICD-10-CM | POA: Diagnosis not present

## 2015-12-26 DIAGNOSIS — M545 Low back pain: Secondary | ICD-10-CM | POA: Diagnosis not present

## 2016-01-28 DIAGNOSIS — J449 Chronic obstructive pulmonary disease, unspecified: Secondary | ICD-10-CM | POA: Diagnosis not present

## 2016-01-28 DIAGNOSIS — N183 Chronic kidney disease, stage 3 (moderate): Secondary | ICD-10-CM | POA: Diagnosis not present

## 2016-01-28 DIAGNOSIS — M179 Osteoarthritis of knee, unspecified: Secondary | ICD-10-CM | POA: Diagnosis not present

## 2016-01-28 DIAGNOSIS — F0390 Unspecified dementia without behavioral disturbance: Secondary | ICD-10-CM | POA: Diagnosis not present

## 2016-02-17 DIAGNOSIS — N401 Enlarged prostate with lower urinary tract symptoms: Secondary | ICD-10-CM | POA: Diagnosis not present

## 2016-02-17 DIAGNOSIS — N183 Chronic kidney disease, stage 3 (moderate): Secondary | ICD-10-CM | POA: Diagnosis not present

## 2016-02-17 DIAGNOSIS — F0390 Unspecified dementia without behavioral disturbance: Secondary | ICD-10-CM | POA: Diagnosis not present

## 2016-02-17 DIAGNOSIS — M179 Osteoarthritis of knee, unspecified: Secondary | ICD-10-CM | POA: Diagnosis not present

## 2016-02-17 DIAGNOSIS — J449 Chronic obstructive pulmonary disease, unspecified: Secondary | ICD-10-CM | POA: Diagnosis not present

## 2016-02-19 DIAGNOSIS — Z720 Tobacco use: Secondary | ICD-10-CM | POA: Diagnosis not present

## 2016-02-19 DIAGNOSIS — K219 Gastro-esophageal reflux disease without esophagitis: Secondary | ICD-10-CM | POA: Diagnosis not present

## 2016-02-19 DIAGNOSIS — J449 Chronic obstructive pulmonary disease, unspecified: Secondary | ICD-10-CM | POA: Diagnosis not present

## 2016-02-19 DIAGNOSIS — N401 Enlarged prostate with lower urinary tract symptoms: Secondary | ICD-10-CM | POA: Diagnosis not present

## 2016-02-19 DIAGNOSIS — N183 Chronic kidney disease, stage 3 (moderate): Secondary | ICD-10-CM | POA: Diagnosis not present

## 2016-02-19 DIAGNOSIS — F0390 Unspecified dementia without behavioral disturbance: Secondary | ICD-10-CM | POA: Diagnosis not present

## 2016-02-19 DIAGNOSIS — M545 Low back pain: Secondary | ICD-10-CM | POA: Diagnosis not present

## 2016-02-25 ENCOUNTER — Encounter (INDEPENDENT_AMBULATORY_CARE_PROVIDER_SITE_OTHER): Payer: Self-pay | Admitting: Internal Medicine

## 2016-03-03 ENCOUNTER — Encounter (INDEPENDENT_AMBULATORY_CARE_PROVIDER_SITE_OTHER): Payer: Self-pay | Admitting: Internal Medicine

## 2016-03-03 ENCOUNTER — Encounter (INDEPENDENT_AMBULATORY_CARE_PROVIDER_SITE_OTHER): Payer: Self-pay

## 2016-03-03 ENCOUNTER — Ambulatory Visit (INDEPENDENT_AMBULATORY_CARE_PROVIDER_SITE_OTHER): Payer: Medicare Other | Admitting: Internal Medicine

## 2016-03-25 NOTE — Progress Notes (Signed)
err

## 2016-04-15 DIAGNOSIS — N183 Chronic kidney disease, stage 3 (moderate): Secondary | ICD-10-CM | POA: Diagnosis not present

## 2016-04-15 DIAGNOSIS — G4700 Insomnia, unspecified: Secondary | ICD-10-CM | POA: Diagnosis not present

## 2016-04-15 DIAGNOSIS — R195 Other fecal abnormalities: Secondary | ICD-10-CM | POA: Diagnosis not present

## 2016-04-15 DIAGNOSIS — K219 Gastro-esophageal reflux disease without esophagitis: Secondary | ICD-10-CM | POA: Diagnosis not present

## 2016-04-15 DIAGNOSIS — N401 Enlarged prostate with lower urinary tract symptoms: Secondary | ICD-10-CM | POA: Diagnosis not present

## 2016-04-15 DIAGNOSIS — M545 Low back pain: Secondary | ICD-10-CM | POA: Diagnosis not present

## 2016-04-15 DIAGNOSIS — J449 Chronic obstructive pulmonary disease, unspecified: Secondary | ICD-10-CM | POA: Diagnosis not present

## 2016-04-15 DIAGNOSIS — Z1211 Encounter for screening for malignant neoplasm of colon: Secondary | ICD-10-CM | POA: Diagnosis not present

## 2016-04-15 DIAGNOSIS — G2581 Restless legs syndrome: Secondary | ICD-10-CM | POA: Diagnosis not present

## 2016-04-15 DIAGNOSIS — F0390 Unspecified dementia without behavioral disturbance: Secondary | ICD-10-CM | POA: Diagnosis not present

## 2016-04-15 DIAGNOSIS — Z1212 Encounter for screening for malignant neoplasm of rectum: Secondary | ICD-10-CM | POA: Diagnosis not present

## 2016-04-16 DIAGNOSIS — N183 Chronic kidney disease, stage 3 (moderate): Secondary | ICD-10-CM | POA: Diagnosis not present

## 2016-04-16 DIAGNOSIS — M545 Low back pain: Secondary | ICD-10-CM | POA: Diagnosis not present

## 2016-07-15 DIAGNOSIS — M545 Low back pain: Secondary | ICD-10-CM | POA: Diagnosis not present

## 2016-07-15 DIAGNOSIS — N183 Chronic kidney disease, stage 3 (moderate): Secondary | ICD-10-CM | POA: Diagnosis not present

## 2016-07-15 DIAGNOSIS — F0390 Unspecified dementia without behavioral disturbance: Secondary | ICD-10-CM | POA: Diagnosis not present

## 2016-07-15 DIAGNOSIS — K219 Gastro-esophageal reflux disease without esophagitis: Secondary | ICD-10-CM | POA: Diagnosis not present

## 2016-07-15 DIAGNOSIS — G4700 Insomnia, unspecified: Secondary | ICD-10-CM | POA: Diagnosis not present

## 2016-07-15 DIAGNOSIS — J449 Chronic obstructive pulmonary disease, unspecified: Secondary | ICD-10-CM | POA: Diagnosis not present

## 2016-08-21 DIAGNOSIS — D513 Other dietary vitamin B12 deficiency anemia: Secondary | ICD-10-CM | POA: Diagnosis not present

## 2016-08-21 DIAGNOSIS — S8291XA Unspecified fracture of right lower leg, initial encounter for closed fracture: Secondary | ICD-10-CM | POA: Diagnosis not present

## 2016-08-21 DIAGNOSIS — R030 Elevated blood-pressure reading, without diagnosis of hypertension: Secondary | ICD-10-CM | POA: Diagnosis not present

## 2016-08-21 DIAGNOSIS — Z91018 Allergy to other foods: Secondary | ICD-10-CM | POA: Diagnosis not present

## 2016-08-21 DIAGNOSIS — Z91030 Bee allergy status: Secondary | ICD-10-CM | POA: Diagnosis not present

## 2016-08-21 DIAGNOSIS — W19XXXA Unspecified fall, initial encounter: Secondary | ICD-10-CM | POA: Diagnosis not present

## 2016-08-21 DIAGNOSIS — R061 Stridor: Secondary | ICD-10-CM | POA: Diagnosis not present

## 2016-08-21 DIAGNOSIS — R9431 Abnormal electrocardiogram [ECG] [EKG]: Secondary | ICD-10-CM | POA: Diagnosis not present

## 2016-08-21 DIAGNOSIS — R2689 Other abnormalities of gait and mobility: Secondary | ICD-10-CM | POA: Diagnosis not present

## 2016-08-21 DIAGNOSIS — W1839XA Other fall on same level, initial encounter: Secondary | ICD-10-CM | POA: Diagnosis not present

## 2016-08-21 DIAGNOSIS — M80051A Age-related osteoporosis with current pathological fracture, right femur, initial encounter for fracture: Secondary | ICD-10-CM | POA: Diagnosis not present

## 2016-08-21 DIAGNOSIS — M25551 Pain in right hip: Secondary | ICD-10-CM | POA: Diagnosis not present

## 2016-08-21 DIAGNOSIS — S79911A Unspecified injury of right hip, initial encounter: Secondary | ICD-10-CM | POA: Diagnosis not present

## 2016-08-21 DIAGNOSIS — Z79899 Other long term (current) drug therapy: Secondary | ICD-10-CM | POA: Diagnosis not present

## 2016-08-21 DIAGNOSIS — N400 Enlarged prostate without lower urinary tract symptoms: Secondary | ICD-10-CM | POA: Diagnosis not present

## 2016-08-21 DIAGNOSIS — Z880 Allergy status to penicillin: Secondary | ICD-10-CM | POA: Diagnosis not present

## 2016-08-21 DIAGNOSIS — G2581 Restless legs syndrome: Secondary | ICD-10-CM | POA: Diagnosis not present

## 2016-08-21 DIAGNOSIS — S72141A Displaced intertrochanteric fracture of right femur, initial encounter for closed fracture: Secondary | ICD-10-CM | POA: Diagnosis not present

## 2016-08-21 DIAGNOSIS — S72001A Fracture of unspecified part of neck of right femur, initial encounter for closed fracture: Secondary | ICD-10-CM | POA: Diagnosis not present

## 2016-08-21 DIAGNOSIS — T148XXA Other injury of unspecified body region, initial encounter: Secondary | ICD-10-CM | POA: Diagnosis not present

## 2016-08-21 DIAGNOSIS — K219 Gastro-esophageal reflux disease without esophagitis: Secondary | ICD-10-CM | POA: Diagnosis not present

## 2016-08-21 DIAGNOSIS — F329 Major depressive disorder, single episode, unspecified: Secondary | ICD-10-CM | POA: Diagnosis not present

## 2016-08-21 DIAGNOSIS — Z01810 Encounter for preprocedural cardiovascular examination: Secondary | ICD-10-CM | POA: Diagnosis not present

## 2016-08-21 DIAGNOSIS — D519 Vitamin B12 deficiency anemia, unspecified: Secondary | ICD-10-CM | POA: Diagnosis present

## 2016-08-21 DIAGNOSIS — J45909 Unspecified asthma, uncomplicated: Secondary | ICD-10-CM | POA: Diagnosis not present

## 2016-08-21 DIAGNOSIS — M11261 Other chondrocalcinosis, right knee: Secondary | ICD-10-CM | POA: Diagnosis not present

## 2016-08-21 DIAGNOSIS — F0390 Unspecified dementia without behavioral disturbance: Secondary | ICD-10-CM | POA: Diagnosis not present

## 2016-08-21 DIAGNOSIS — J449 Chronic obstructive pulmonary disease, unspecified: Secondary | ICD-10-CM | POA: Diagnosis not present

## 2016-08-21 DIAGNOSIS — S72144A Nondisplaced intertrochanteric fracture of right femur, initial encounter for closed fracture: Secondary | ICD-10-CM | POA: Diagnosis not present

## 2016-08-21 DIAGNOSIS — Y998 Other external cause status: Secondary | ICD-10-CM | POA: Diagnosis not present

## 2016-08-21 DIAGNOSIS — E860 Dehydration: Secondary | ICD-10-CM | POA: Diagnosis not present

## 2016-08-21 DIAGNOSIS — F17210 Nicotine dependence, cigarettes, uncomplicated: Secondary | ICD-10-CM | POA: Diagnosis not present

## 2016-08-21 DIAGNOSIS — W010XXA Fall on same level from slipping, tripping and stumbling without subsequent striking against object, initial encounter: Secondary | ICD-10-CM | POA: Diagnosis not present

## 2016-08-21 DIAGNOSIS — Z0389 Encounter for observation for other suspected diseases and conditions ruled out: Secondary | ICD-10-CM | POA: Diagnosis not present

## 2016-08-28 DIAGNOSIS — F0390 Unspecified dementia without behavioral disturbance: Secondary | ICD-10-CM | POA: Diagnosis not present

## 2016-08-28 DIAGNOSIS — S72144D Nondisplaced intertrochanteric fracture of right femur, subsequent encounter for closed fracture with routine healing: Secondary | ICD-10-CM | POA: Diagnosis not present

## 2016-08-28 DIAGNOSIS — N400 Enlarged prostate without lower urinary tract symptoms: Secondary | ICD-10-CM | POA: Diagnosis not present

## 2016-08-28 DIAGNOSIS — G2581 Restless legs syndrome: Secondary | ICD-10-CM | POA: Diagnosis not present

## 2016-08-28 DIAGNOSIS — Z9181 History of falling: Secondary | ICD-10-CM | POA: Diagnosis not present

## 2016-08-28 DIAGNOSIS — K219 Gastro-esophageal reflux disease without esophagitis: Secondary | ICD-10-CM | POA: Diagnosis not present

## 2016-08-28 DIAGNOSIS — J449 Chronic obstructive pulmonary disease, unspecified: Secondary | ICD-10-CM | POA: Diagnosis not present

## 2016-08-30 DIAGNOSIS — Z9181 History of falling: Secondary | ICD-10-CM | POA: Diagnosis not present

## 2016-08-30 DIAGNOSIS — S72144D Nondisplaced intertrochanteric fracture of right femur, subsequent encounter for closed fracture with routine healing: Secondary | ICD-10-CM | POA: Diagnosis not present

## 2016-08-30 DIAGNOSIS — G2581 Restless legs syndrome: Secondary | ICD-10-CM | POA: Diagnosis not present

## 2016-08-30 DIAGNOSIS — K219 Gastro-esophageal reflux disease without esophagitis: Secondary | ICD-10-CM | POA: Diagnosis not present

## 2016-08-30 DIAGNOSIS — J449 Chronic obstructive pulmonary disease, unspecified: Secondary | ICD-10-CM | POA: Diagnosis not present

## 2016-08-30 DIAGNOSIS — F0390 Unspecified dementia without behavioral disturbance: Secondary | ICD-10-CM | POA: Diagnosis not present

## 2016-08-31 DIAGNOSIS — S72144D Nondisplaced intertrochanteric fracture of right femur, subsequent encounter for closed fracture with routine healing: Secondary | ICD-10-CM | POA: Diagnosis not present

## 2016-08-31 DIAGNOSIS — G2581 Restless legs syndrome: Secondary | ICD-10-CM | POA: Diagnosis not present

## 2016-08-31 DIAGNOSIS — F0390 Unspecified dementia without behavioral disturbance: Secondary | ICD-10-CM | POA: Diagnosis not present

## 2016-08-31 DIAGNOSIS — K219 Gastro-esophageal reflux disease without esophagitis: Secondary | ICD-10-CM | POA: Diagnosis not present

## 2016-08-31 DIAGNOSIS — J449 Chronic obstructive pulmonary disease, unspecified: Secondary | ICD-10-CM | POA: Diagnosis not present

## 2016-08-31 DIAGNOSIS — Z9181 History of falling: Secondary | ICD-10-CM | POA: Diagnosis not present

## 2016-09-01 DIAGNOSIS — J449 Chronic obstructive pulmonary disease, unspecified: Secondary | ICD-10-CM | POA: Diagnosis not present

## 2016-09-01 DIAGNOSIS — S72144D Nondisplaced intertrochanteric fracture of right femur, subsequent encounter for closed fracture with routine healing: Secondary | ICD-10-CM | POA: Diagnosis not present

## 2016-09-01 DIAGNOSIS — K219 Gastro-esophageal reflux disease without esophagitis: Secondary | ICD-10-CM | POA: Diagnosis not present

## 2016-09-01 DIAGNOSIS — G2581 Restless legs syndrome: Secondary | ICD-10-CM | POA: Diagnosis not present

## 2016-09-01 DIAGNOSIS — F0390 Unspecified dementia without behavioral disturbance: Secondary | ICD-10-CM | POA: Diagnosis not present

## 2016-09-01 DIAGNOSIS — Z9181 History of falling: Secondary | ICD-10-CM | POA: Diagnosis not present

## 2016-09-02 DIAGNOSIS — J449 Chronic obstructive pulmonary disease, unspecified: Secondary | ICD-10-CM | POA: Diagnosis not present

## 2016-09-02 DIAGNOSIS — F0390 Unspecified dementia without behavioral disturbance: Secondary | ICD-10-CM | POA: Diagnosis not present

## 2016-09-02 DIAGNOSIS — S72144D Nondisplaced intertrochanteric fracture of right femur, subsequent encounter for closed fracture with routine healing: Secondary | ICD-10-CM | POA: Diagnosis not present

## 2016-09-02 DIAGNOSIS — Z9181 History of falling: Secondary | ICD-10-CM | POA: Diagnosis not present

## 2016-09-02 DIAGNOSIS — G2581 Restless legs syndrome: Secondary | ICD-10-CM | POA: Diagnosis not present

## 2016-09-02 DIAGNOSIS — K219 Gastro-esophageal reflux disease without esophagitis: Secondary | ICD-10-CM | POA: Diagnosis not present

## 2016-09-06 DIAGNOSIS — J449 Chronic obstructive pulmonary disease, unspecified: Secondary | ICD-10-CM | POA: Diagnosis not present

## 2016-09-06 DIAGNOSIS — K219 Gastro-esophageal reflux disease without esophagitis: Secondary | ICD-10-CM | POA: Diagnosis not present

## 2016-09-06 DIAGNOSIS — S72144D Nondisplaced intertrochanteric fracture of right femur, subsequent encounter for closed fracture with routine healing: Secondary | ICD-10-CM | POA: Diagnosis not present

## 2016-09-06 DIAGNOSIS — F0390 Unspecified dementia without behavioral disturbance: Secondary | ICD-10-CM | POA: Diagnosis not present

## 2016-09-06 DIAGNOSIS — G2581 Restless legs syndrome: Secondary | ICD-10-CM | POA: Diagnosis not present

## 2016-09-06 DIAGNOSIS — Z9181 History of falling: Secondary | ICD-10-CM | POA: Diagnosis not present

## 2016-09-08 DIAGNOSIS — S72144D Nondisplaced intertrochanteric fracture of right femur, subsequent encounter for closed fracture with routine healing: Secondary | ICD-10-CM | POA: Diagnosis not present

## 2016-09-08 DIAGNOSIS — J449 Chronic obstructive pulmonary disease, unspecified: Secondary | ICD-10-CM | POA: Diagnosis not present

## 2016-09-08 DIAGNOSIS — K219 Gastro-esophageal reflux disease without esophagitis: Secondary | ICD-10-CM | POA: Diagnosis not present

## 2016-09-08 DIAGNOSIS — G2581 Restless legs syndrome: Secondary | ICD-10-CM | POA: Diagnosis not present

## 2016-09-08 DIAGNOSIS — F0390 Unspecified dementia without behavioral disturbance: Secondary | ICD-10-CM | POA: Diagnosis not present

## 2016-09-08 DIAGNOSIS — Z9181 History of falling: Secondary | ICD-10-CM | POA: Diagnosis not present

## 2016-09-09 DIAGNOSIS — S72144D Nondisplaced intertrochanteric fracture of right femur, subsequent encounter for closed fracture with routine healing: Secondary | ICD-10-CM | POA: Diagnosis not present

## 2016-09-09 DIAGNOSIS — J449 Chronic obstructive pulmonary disease, unspecified: Secondary | ICD-10-CM | POA: Diagnosis not present

## 2016-09-09 DIAGNOSIS — K219 Gastro-esophageal reflux disease without esophagitis: Secondary | ICD-10-CM | POA: Diagnosis not present

## 2016-09-09 DIAGNOSIS — G2581 Restless legs syndrome: Secondary | ICD-10-CM | POA: Diagnosis not present

## 2016-09-09 DIAGNOSIS — Z9181 History of falling: Secondary | ICD-10-CM | POA: Diagnosis not present

## 2016-09-09 DIAGNOSIS — F0390 Unspecified dementia without behavioral disturbance: Secondary | ICD-10-CM | POA: Diagnosis not present

## 2016-09-13 DIAGNOSIS — R413 Other amnesia: Secondary | ICD-10-CM | POA: Diagnosis not present

## 2016-09-13 DIAGNOSIS — R300 Dysuria: Secondary | ICD-10-CM | POA: Diagnosis not present

## 2016-09-13 DIAGNOSIS — R5381 Other malaise: Secondary | ICD-10-CM | POA: Diagnosis not present

## 2016-09-13 DIAGNOSIS — Z09 Encounter for follow-up examination after completed treatment for conditions other than malignant neoplasm: Secondary | ICD-10-CM | POA: Diagnosis not present

## 2016-09-13 DIAGNOSIS — S72001D Fracture of unspecified part of neck of right femur, subsequent encounter for closed fracture with routine healing: Secondary | ICD-10-CM | POA: Diagnosis not present

## 2016-09-14 DIAGNOSIS — S72141D Displaced intertrochanteric fracture of right femur, subsequent encounter for closed fracture with routine healing: Secondary | ICD-10-CM | POA: Diagnosis not present

## 2016-09-14 DIAGNOSIS — S72144D Nondisplaced intertrochanteric fracture of right femur, subsequent encounter for closed fracture with routine healing: Secondary | ICD-10-CM | POA: Diagnosis not present

## 2016-09-15 DIAGNOSIS — S72144D Nondisplaced intertrochanteric fracture of right femur, subsequent encounter for closed fracture with routine healing: Secondary | ICD-10-CM | POA: Diagnosis not present

## 2016-09-15 DIAGNOSIS — J449 Chronic obstructive pulmonary disease, unspecified: Secondary | ICD-10-CM | POA: Diagnosis not present

## 2016-09-15 DIAGNOSIS — G2581 Restless legs syndrome: Secondary | ICD-10-CM | POA: Diagnosis not present

## 2016-09-15 DIAGNOSIS — F0390 Unspecified dementia without behavioral disturbance: Secondary | ICD-10-CM | POA: Diagnosis not present

## 2016-09-15 DIAGNOSIS — K219 Gastro-esophageal reflux disease without esophagitis: Secondary | ICD-10-CM | POA: Diagnosis not present

## 2016-09-15 DIAGNOSIS — Z9181 History of falling: Secondary | ICD-10-CM | POA: Diagnosis not present

## 2016-09-17 ENCOUNTER — Emergency Department (HOSPITAL_COMMUNITY): Payer: Medicare Other

## 2016-09-17 ENCOUNTER — Encounter (HOSPITAL_COMMUNITY): Payer: Self-pay

## 2016-09-17 ENCOUNTER — Emergency Department (HOSPITAL_COMMUNITY)
Admission: EM | Admit: 2016-09-17 | Discharge: 2016-09-18 | Disposition: A | Discharge status: Home / Self Care | Payer: Medicare Other | Attending: Emergency Medicine | Admitting: Emergency Medicine

## 2016-09-17 DIAGNOSIS — Y929 Unspecified place or not applicable: Secondary | ICD-10-CM | POA: Diagnosis not present

## 2016-09-17 DIAGNOSIS — S72115A Nondisplaced fracture of greater trochanter of left femur, initial encounter for closed fracture: Secondary | ICD-10-CM | POA: Diagnosis not present

## 2016-09-17 DIAGNOSIS — S72102A Unspecified trochanteric fracture of left femur, initial encounter for closed fracture: Secondary | ICD-10-CM | POA: Diagnosis not present

## 2016-09-17 DIAGNOSIS — Z79899 Other long term (current) drug therapy: Secondary | ICD-10-CM | POA: Insufficient documentation

## 2016-09-17 DIAGNOSIS — N390 Urinary tract infection, site not specified: Secondary | ICD-10-CM

## 2016-09-17 DIAGNOSIS — W01198A Fall on same level from slipping, tripping and stumbling with subsequent striking against other object, initial encounter: Secondary | ICD-10-CM | POA: Diagnosis not present

## 2016-09-17 DIAGNOSIS — G8929 Other chronic pain: Secondary | ICD-10-CM | POA: Diagnosis not present

## 2016-09-17 DIAGNOSIS — G2581 Restless legs syndrome: Secondary | ICD-10-CM | POA: Diagnosis not present

## 2016-09-17 DIAGNOSIS — J45909 Unspecified asthma, uncomplicated: Secondary | ICD-10-CM | POA: Diagnosis not present

## 2016-09-17 DIAGNOSIS — Y9301 Activity, walking, marching and hiking: Secondary | ICD-10-CM | POA: Insufficient documentation

## 2016-09-17 DIAGNOSIS — J449 Chronic obstructive pulmonary disease, unspecified: Secondary | ICD-10-CM | POA: Diagnosis not present

## 2016-09-17 DIAGNOSIS — F17210 Nicotine dependence, cigarettes, uncomplicated: Secondary | ICD-10-CM | POA: Diagnosis not present

## 2016-09-17 DIAGNOSIS — F0390 Unspecified dementia without behavioral disturbance: Secondary | ICD-10-CM | POA: Diagnosis not present

## 2016-09-17 DIAGNOSIS — M25552 Pain in left hip: Secondary | ICD-10-CM | POA: Diagnosis not present

## 2016-09-17 DIAGNOSIS — Y999 Unspecified external cause status: Secondary | ICD-10-CM | POA: Insufficient documentation

## 2016-09-17 DIAGNOSIS — K219 Gastro-esophageal reflux disease without esophagitis: Secondary | ICD-10-CM | POA: Diagnosis not present

## 2016-09-17 DIAGNOSIS — S79912A Unspecified injury of left hip, initial encounter: Secondary | ICD-10-CM | POA: Diagnosis not present

## 2016-09-17 DIAGNOSIS — S72112A Displaced fracture of greater trochanter of left femur, initial encounter for closed fracture: Secondary | ICD-10-CM | POA: Diagnosis not present

## 2016-09-17 DIAGNOSIS — Z9181 History of falling: Secondary | ICD-10-CM | POA: Diagnosis not present

## 2016-09-17 DIAGNOSIS — S72144D Nondisplaced intertrochanteric fracture of right femur, subsequent encounter for closed fracture with routine healing: Secondary | ICD-10-CM | POA: Diagnosis not present

## 2016-09-17 LAB — BASIC METABOLIC PANEL
Anion gap: 10 (ref 5–15)
BUN: 27 mg/dL — ABNORMAL HIGH (ref 6–20)
CALCIUM: 8.4 mg/dL — AB (ref 8.9–10.3)
CO2: 21 mmol/L — AB (ref 22–32)
Chloride: 104 mmol/L (ref 101–111)
Creatinine, Ser: 1.47 mg/dL — ABNORMAL HIGH (ref 0.61–1.24)
GFR calc Af Amer: 51 mL/min — ABNORMAL LOW (ref >60)
GFR calc non Af Amer: 44 mL/min — ABNORMAL LOW (ref >60)
GLUCOSE: 114 mg/dL — AB (ref 65–99)
Potassium: 4.4 mmol/L (ref 3.5–5.1)
Sodium: 135 mmol/L (ref 135–145)

## 2016-09-17 LAB — URINALYSIS, ROUTINE W REFLEX MICROSCOPIC
BILIRUBIN URINE: NEGATIVE
Glucose, UA: NEGATIVE mg/dL
Hgb urine dipstick: NEGATIVE
Ketones, ur: NEGATIVE mg/dL
Nitrite: POSITIVE — AB
Protein, ur: 30 mg/dL — AB
SPECIFIC GRAVITY, URINE: 1.013 (ref 1.005–1.030)
pH: 8 (ref 5.0–8.0)

## 2016-09-17 LAB — CBC WITH DIFFERENTIAL/PLATELET
Basophils Absolute: 0 10*3/uL (ref 0.0–0.1)
Basophils Relative: 0 %
Eosinophils Absolute: 0.1 10*3/uL (ref 0.0–0.7)
Eosinophils Relative: 0 %
HEMATOCRIT: 33.1 % — AB (ref 39.0–52.0)
Hemoglobin: 10.7 g/dL — ABNORMAL LOW (ref 13.0–17.0)
LYMPHS PCT: 5 %
Lymphs Abs: 0.6 10*3/uL — ABNORMAL LOW (ref 0.7–4.0)
MCH: 30.7 pg (ref 26.0–34.0)
MCHC: 32.3 g/dL (ref 30.0–36.0)
MCV: 94.8 fL (ref 78.0–100.0)
MONO ABS: 0.8 10*3/uL (ref 0.1–1.0)
MONOS PCT: 7 %
NEUTROS ABS: 10.5 10*3/uL — AB (ref 1.7–7.7)
Neutrophils Relative %: 88 %
Platelets: 304 10*3/uL (ref 150–400)
RBC: 3.49 MIL/uL — ABNORMAL LOW (ref 4.22–5.81)
RDW: 14.1 % (ref 11.5–15.5)
WBC: 11.9 10*3/uL — ABNORMAL HIGH (ref 4.0–10.5)

## 2016-09-17 MED ORDER — MORPHINE SULFATE (PF) 4 MG/ML IV SOLN
4.0000 mg | Freq: Once | INTRAVENOUS | Status: AC
  Administered 2016-09-17: 4 mg via INTRAVENOUS
  Filled 2016-09-17: qty 1

## 2016-09-17 NOTE — ED Notes (Signed)
PA student at bedside.

## 2016-09-17 NOTE — ED Notes (Signed)
From Radiology 

## 2016-09-17 NOTE — ED Triage Notes (Signed)
Pt was trying to separate 2 dogs and slipped and fell.  Pt c/o pain to both hips but more on the left.

## 2016-09-17 NOTE — ED Notes (Signed)
Dr Miller in to assess 

## 2016-09-17 NOTE — ED Provider Notes (Signed)
[facility] DEPT Provider Note   CSN: [id number] Arrival date & time: 09/17/16  2059     History   Chief Complaint Chief Complaint  Patient presents with  . Fall    hip pain    HPI Anthony [name] is a 76 y.o. male.  HPI  Had to separate 2 dogs with his walker yesterday - he tripped when moving from one room to another and fell on his L side - he has pain in the L hip =- no head injury, no LOC, family was there - occurred yesterday hours ago - pain is worsened.  Sx are constant, worse with rOM and ambulation.  Recent R hip surgery for intertroch fracture of the R hip.    Past Medical History:  Diagnosis Date  . Acute pyelonephritis 12/23/2014  . Asthma   . Chronic back pain   . Chronic hip pain   . Depression   . Kidney stone   . Neuropathy   . Pulmonary nodules 12/25/2014    Patient Active Problem List   Diagnosis Date Noted  . Hyponatremia 12/25/2014  . Hypokalemia 12/25/2014  . Normocytic anemia 12/25/2014  . Pulmonary nodules 12/25/2014  . Tobacco abuse 12/25/2014  . ARF (acute renal failure) ([place]) 12/24/2014  . Acute pyelonephritis 12/23/2014    Past Surgical History:  Procedure Laterality Date  . ABDOMINAL SURGERY     heria  . EYE SURGERY         Home Medications    Prior to Admission medications   Medication Sig Start Date End Date Taking? Authorizing Provider  omeprazole (PRILOSEC) 20 MG capsule Take 20 mg by mouth daily.   Yes [provider]  pramipexole (MIRAPEX) 0.125 MG tablet Take 0.125 mg by mouth at bedtime.   Yes [provider]  tamsulosin (FLOMAX) 0.4 MG CAPS capsule Take 0.4 mg by mouth daily as needed.    Yes [provider]  traMADol (ULTRAM) 50 MG tablet Take 50 mg by mouth daily as needed for moderate pain.   Yes [provider]  traZODone (DESYREL) 100 MG tablet Take 200 mg by mouth at bedtime.   Yes [provider]  albuterol (PROVENTIL HFA;VENTOLIN HFA) 108 (90 BASE) MCG/ACT inhaler  Inhale 2 puffs into the lungs every 4 (four) hours as needed for wheezing or shortness of breath.    [provider]  fluticasone (FLONASE) 50 MCG/ACT nasal spray Place 1 spray into both nostrils daily as needed for allergies or rhinitis.    [provider]  HYDROcodone-acetaminophen (NORCO/VICODIN) 5-325 MG tablet Take 2 tablets by mouth every 4 (four) hours as needed. 09/18/16   Noemi Chapel, MD  sulfamethoxazole-trimethoprim (BACTRIM DS,SEPTRA DS) 800-160 MG tablet Take 1 tablet by mouth 2 (two) times daily. 09/18/16 09/25/16  Noemi Chapel, MD    Family History No family history on file.  Social History Social History  Substance Use Topics  . Smoking status: Current Every Day Smoker    Packs/day: 0.50    Types: Cigarettes  . Smokeless tobacco: Never Used  . Alcohol use No     Allergies   Bee venom; Penicillins; and Strawberry extract   Review of Systems Review of Systems  All other systems reviewed and are negative.    Physical Exam Updated Vital Signs BP 137/80 (BP Location: Left Arm)   Pulse 84   Temp 98.2 F (36.8 C) (Oral)   Resp 17   Ht 6\' 1"  (1.854 m)   Wt 77.6 kg (171 lb)  SpO2 100%   BMI 22.56 kg/m   Physical Exam  Constitutional: He appears well-developed and well-nourished. No distress.  HENT:  Head: Normocephalic and atraumatic.  Mouth/Throat: Oropharynx is clear and moist. No oropharyngeal exudate.  Eyes: Conjunctivae and EOM are normal. Pupils are equal, round, and reactive to light. Right eye exhibits no discharge. Left eye exhibits no discharge. No scleral icterus.  Neck: Normal range of motion. Neck supple. No JVD present. No thyromegaly present.  Cardiovascular: Normal rate, regular rhythm, normal heart sounds and intact distal pulses.  Exam reveals no gallop and no friction rub.   No murmur heard. Pulmonary/Chest: Effort normal and breath sounds normal. No respiratory distress. He has no wheezes. He has no rales.  Abdominal:  Soft. Bowel sounds are normal. He exhibits no distension and no mass. There is no tenderness.  Musculoskeletal: He exhibits tenderness. He exhibits no edema.  Abnormal range of motion of the left leg, he is only able to keep his hip in a flexed position, when he tries to straighten that he has severe pain in the hip. With his hip in the flexed position at with internal and external rotation he has severe pain as well. There is no bruising or swelling over the hip on the left. All other joints appear unremarkable.  Lymphadenopathy:    He has no cervical adenopathy.  Neurological: He is alert. Coordination normal.  Normal strength and sensation of the LLE below the hip  Skin: Skin is warm and dry. No rash noted. No erythema.  Psychiatric: He has a normal mood and affect. His behavior is normal.  Nursing note and vitals reviewed.    ED Treatments / Results  Labs (all labs ordered are listed, but only abnormal results are displayed) Labs Reviewed  CBC WITH DIFFERENTIAL/PLATELET - Abnormal; Notable for the following:       Result Value   WBC 11.9 (*)    RBC 3.49 (*)    Hemoglobin 10.7 (*)    HCT 33.1 (*)    Neutro Abs 10.5 (*)    Lymphs Abs 0.6 (*)    All other components within normal limits  BASIC METABOLIC PANEL - Abnormal; Notable for the following:    CO2 21 (*)    Glucose, Bld 114 (*)    BUN 27 (*)    Creatinine, Ser 1.47 (*)    Calcium 8.4 (*)    GFR calc non Af Amer 44 (*)    GFR calc Af Amer 51 (*)    All other components within normal limits  URINALYSIS, ROUTINE W REFLEX MICROSCOPIC - Abnormal; Notable for the following:    APPearance CLOUDY (*)    Protein, ur 30 (*)    Nitrite POSITIVE (*)    Leukocytes, UA LARGE (*)    Bacteria, UA RARE (*)    Squamous Epithelial / LPF 0-5 (*)    All other components within normal limits  URINE CULTURE  TYPE AND SCREEN    EKG  EKG Interpretation None       Radiology Ct Hip Left Wo Contrast  Result Date:  09/17/2016 CLINICAL DATA:  Left hip pain after fall today. EXAM: CT OF THE LEFT HIP WITHOUT CONTRAST TECHNIQUE: Multidetector CT imaging of the left hip was performed according to the standard protocol. Multiplanar CT image reconstructions were also generated. COMPARISON:  Same day radiographs of the left hip FINDINGS: Bones/Joint/Cartilage Acute, closed, comminuted and slightly avulsed greater trochanteric fracture. No femoral neck fracture. No femoral head dislocations. There  is osteoarthritis of left hip. The adjacent pubic rami and acetabulum appear intact. There is osteoarthritis of the included SI joint and pubic symphysis. Small bone island along the iliac aspect of the SI joint. There is lower lumbar degenerative disc disease. Ligaments Suboptimally assessed by CT. Muscles and Tendons No intramuscular mass or hemorrhage. Soft tissues Thick-walled appearance of the bladder may be secondary to underdistention however a cystitis is not entirely excluded. Clinical correlation is recommended. Soft tissue contusion overlying the left greater trochanter. No soft tissue mass or fluid collections. IMPRESSION: 1. Acute, closed, comminuted and partially avulsed greater trochanteric fracture of the femur. 2. Under distended bladder which is slightly thick-walled in appearance. Correlate to exclude a cystitis. 3. Left SI joint and pubic symphysis osteoarthritis. Osteoarthritis of the left hip also noted. Electronically Signed   By: Ashley Royalty M.D.   On: 09/17/2016 23:38   Dg Hip Unilat With Pelvis 2-3 Views Left  Result Date: 09/17/2016 CLINICAL DATA:  Slipped and fell, with left hip pain. Initial encounter. EXAM: DG HIP (WITH OR WITHOUT PELVIS) 2-3V LEFT COMPARISON:  None. FINDINGS: A focal linear density at the left femoral neck is thought to be artifactual in nature. Both femoral heads are seated normally within their respective acetabula. Right femoral hardware is grossly unremarkable, though incompletely staged.  The proximal left femur appears intact. Mild degenerative change is noted at the lower lumbar spine. The sacroiliac joints are grossly unremarkable. The visualized bowel gas pattern is grossly unremarkable in appearance. Scattered phleboliths are noted within the pelvis. IMPRESSION: No definite evidence of fracture. Focal linear density at the left femoral neck is thought to be artifactual in nature. Electronically Signed   By: Garald Balding M.D.   On: 09/17/2016 21:57    Procedures Procedures (including critical care time)  Medications Ordered in ED Medications  sulfamethoxazole-trimethoprim (BACTRIM DS,SEPTRA DS) 800-160 MG per tablet 1 tablet (not administered)  HYDROcodone-acetaminophen (NORCO/VICODIN) 5-325 MG per tablet 2 tablet (not administered)  morphine 4 MG/ML injection 4 mg (4 mg Intravenous Given 09/17/16 2311)     Initial Impression / Assessment and Plan / ED Course  I have reviewed the triage vital signs and the nursing notes.  Pertinent labs & imaging results that were available during my care of the patient were reviewed by me and considered in my medical decision making (see chart for details).     X-ray initially showed no obvious fractures however the patient does have a CT scan confirmed trochanteric fracture. This is not a weightbearing fracture, the patient has been able to stand at the bedside, he is able to help get from the bed to a wheelchair and has help from both a daughter and her grandson at his house. He also has home health which is coming to the house to assist with physical therapy due to his recent right-sided hip fracture. I have encouraged the patient to follow-up with his orthopedist at the [facility] in [place]. He has the phone number. I have also given him 10 tablets of hydrocodone to help with pain since he is having significant pain.  Labs were overall unremarkable however his urinalysis did reveal what appears to be a urinary tract infection  so we will start an antibiotic. Culture sent due to the patient's age and gender.  Final Clinical Impressions(s) / ED Diagnoses   Final diagnoses:  Closed fracture of trochanter of left femur, initial encounter ([place])  Urinary tract infection without hematuria, site unspecified  New Prescriptions New Prescriptions   HYDROCODONE-ACETAMINOPHEN (NORCO/VICODIN) 5-325 MG TABLET    Take 2 tablets by mouth every 4 (four) hours as needed.   SULFAMETHOXAZOLE-TRIMETHOPRIM (BACTRIM DS,SEPTRA DS) 800-160 MG TABLET    Take 1 tablet by mouth 2 (two) times daily.     Noemi Chapel, MD 09/18/16 [phone]

## 2016-09-17 NOTE — ED Notes (Addendum)
Pt lives with daughter and her "brats"- He asks for something to eat- when asked if daughter doe not help him eat/cook etc. Pt relies she is doing other things.  He is dirty and unshaven  He has not eaten per his report

## 2016-09-18 DIAGNOSIS — S72102A Unspecified trochanteric fracture of left femur, initial encounter for closed fracture: Secondary | ICD-10-CM | POA: Diagnosis not present

## 2016-09-18 DIAGNOSIS — R279 Unspecified lack of coordination: Secondary | ICD-10-CM | POA: Diagnosis not present

## 2016-09-18 DIAGNOSIS — Z7401 Bed confinement status: Secondary | ICD-10-CM | POA: Diagnosis not present

## 2016-09-18 LAB — TYPE AND SCREEN
ABO/RH(D): O POS
ANTIBODY SCREEN: NEGATIVE

## 2016-09-18 MED ORDER — HYDROCODONE-ACETAMINOPHEN 5-325 MG PO TABS
2.0000 | ORAL_TABLET | Freq: Once | ORAL | Status: AC
  Administered 2016-09-18: 2 via ORAL
  Filled 2016-09-18: qty 2

## 2016-09-18 MED ORDER — SULFAMETHOXAZOLE-TRIMETHOPRIM 800-160 MG PO TABS: 1.0000 | ORAL_TABLET | Freq: Two times a day (BID) | ORAL | 0 refills | Status: AC

## 2016-09-18 MED ORDER — HYDROCODONE-ACETAMINOPHEN 5-325 MG PO TABS: 2.0000 | ORAL_TABLET | ORAL | 0 refills | Status: DC | PRN

## 2016-09-18 MED ORDER — SULFAMETHOXAZOLE-TRIMETHOPRIM 800-160 MG PO TABS
1.0000 | ORAL_TABLET | Freq: Once | ORAL | Status: AC
  Administered 2016-09-18: 1 via ORAL
  Filled 2016-09-18: qty 1

## 2016-09-18 NOTE — Discharge Instructions (Signed)
Please see your family doctor within the next 2-3 days for a recheck and for a refill of pain medication as needed. He will also need to call your orthopedic surgeon for follow-up within the next week.  You also have a urinary tract infection. Please take Bactrim by mouth twice a day for the next 7 days.  Your Orthopedic Doctor, Dr. Annabelle Harman at [facility] will need to follow up with you. The phone number is [phone], [phone].  Please return to the emergency department for severe or worsening pain though he should expect to have some pain over the next couple of weeks.  Please use a wheelchair to get around the house. You should try to do some walking and work with your physical therapy and home health as they come in to the house to help you.  Hydrocodone, one or 2 tablets every 6 hours as needed for severe pain.  If you are going to take the hydrocodone you should be taking a stool softener with it to avoid constipation.

## 2016-09-18 NOTE — ED Notes (Signed)
Pt ambulated with max. Assistance.

## 2016-09-20 ENCOUNTER — Telehealth: Payer: Self-pay | Admitting: *Deleted

## 2016-09-20 DIAGNOSIS — N401 Enlarged prostate with lower urinary tract symptoms: Secondary | ICD-10-CM | POA: Diagnosis not present

## 2016-09-20 DIAGNOSIS — S72144D Nondisplaced intertrochanteric fracture of right femur, subsequent encounter for closed fracture with routine healing: Secondary | ICD-10-CM | POA: Diagnosis not present

## 2016-09-20 DIAGNOSIS — J449 Chronic obstructive pulmonary disease, unspecified: Secondary | ICD-10-CM | POA: Diagnosis not present

## 2016-09-20 DIAGNOSIS — N183 Chronic kidney disease, stage 3 (moderate): Secondary | ICD-10-CM | POA: Diagnosis not present

## 2016-09-20 DIAGNOSIS — G2581 Restless legs syndrome: Secondary | ICD-10-CM | POA: Diagnosis not present

## 2016-09-20 DIAGNOSIS — F0390 Unspecified dementia without behavioral disturbance: Secondary | ICD-10-CM | POA: Diagnosis not present

## 2016-09-20 DIAGNOSIS — Z9181 History of falling: Secondary | ICD-10-CM | POA: Diagnosis not present

## 2016-09-20 DIAGNOSIS — M179 Osteoarthritis of knee, unspecified: Secondary | ICD-10-CM | POA: Diagnosis not present

## 2016-09-20 DIAGNOSIS — K219 Gastro-esophageal reflux disease without esophagitis: Secondary | ICD-10-CM | POA: Diagnosis not present

## 2016-09-20 LAB — URINE CULTURE: Culture: >=100000 — AB

## 2016-09-20 NOTE — Telephone Encounter (Signed)
Debbie called regarding weight bearing status for pt.  EDCM reviewed chart and did not find any specifics other than it "is not a weight bearing fracture."

## 2016-09-21 ENCOUNTER — Telehealth: Payer: Self-pay | Admitting: *Deleted

## 2016-09-21 NOTE — Telephone Encounter (Signed)
Post ED Visit - Positive Culture Follow-up  Culture report reviewed by antimicrobial stewardship pharmacist:  []  Elenor Quinones, Pharm.D. []  Heide Guile, Pharm.D., BCPS AQ-ID []  Parks Neptune, Pharm.D., BCPS [x]  Alycia Rossetti, Pharm.D., BCPS []  [place], [place].D., BCPS, AAHIVP []  Legrand Como, Pharm.D., BCPS, AAHIVP []  Salome Arnt, PharmD, BCPS []  Dimitri Ped, PharmD, BCPS []  Vincenza Hews, PharmD, BCPS  Positive urine culture Treated with Sulfamethoxazole-Trimethoprim, organism sensitive to the same and no further patient follow-up is required at this time.  Harlon Flor [facility] 09/21/2016, 9:38 AM

## 2016-09-22 DIAGNOSIS — K219 Gastro-esophageal reflux disease without esophagitis: Secondary | ICD-10-CM | POA: Diagnosis not present

## 2016-09-22 DIAGNOSIS — Z9181 History of falling: Secondary | ICD-10-CM | POA: Diagnosis not present

## 2016-09-22 DIAGNOSIS — F0390 Unspecified dementia without behavioral disturbance: Secondary | ICD-10-CM | POA: Diagnosis not present

## 2016-09-22 DIAGNOSIS — J449 Chronic obstructive pulmonary disease, unspecified: Secondary | ICD-10-CM | POA: Diagnosis not present

## 2016-09-22 DIAGNOSIS — S72144D Nondisplaced intertrochanteric fracture of right femur, subsequent encounter for closed fracture with routine healing: Secondary | ICD-10-CM | POA: Diagnosis not present

## 2016-09-22 DIAGNOSIS — G2581 Restless legs syndrome: Secondary | ICD-10-CM | POA: Diagnosis not present

## 2016-09-23 DIAGNOSIS — F0390 Unspecified dementia without behavioral disturbance: Secondary | ICD-10-CM | POA: Diagnosis not present

## 2016-09-23 DIAGNOSIS — K219 Gastro-esophageal reflux disease without esophagitis: Secondary | ICD-10-CM | POA: Diagnosis not present

## 2016-09-23 DIAGNOSIS — Z9181 History of falling: Secondary | ICD-10-CM | POA: Diagnosis not present

## 2016-09-23 DIAGNOSIS — S72144D Nondisplaced intertrochanteric fracture of right femur, subsequent encounter for closed fracture with routine healing: Secondary | ICD-10-CM | POA: Diagnosis not present

## 2016-09-23 DIAGNOSIS — J449 Chronic obstructive pulmonary disease, unspecified: Secondary | ICD-10-CM | POA: Diagnosis not present

## 2016-09-23 DIAGNOSIS — G2581 Restless legs syndrome: Secondary | ICD-10-CM | POA: Diagnosis not present

## 2016-09-24 DIAGNOSIS — J449 Chronic obstructive pulmonary disease, unspecified: Secondary | ICD-10-CM | POA: Diagnosis not present

## 2016-09-24 DIAGNOSIS — Z9181 History of falling: Secondary | ICD-10-CM | POA: Diagnosis not present

## 2016-09-24 DIAGNOSIS — S72144D Nondisplaced intertrochanteric fracture of right femur, subsequent encounter for closed fracture with routine healing: Secondary | ICD-10-CM | POA: Diagnosis not present

## 2016-09-24 DIAGNOSIS — G2581 Restless legs syndrome: Secondary | ICD-10-CM | POA: Diagnosis not present

## 2016-09-24 DIAGNOSIS — F0390 Unspecified dementia without behavioral disturbance: Secondary | ICD-10-CM | POA: Diagnosis not present

## 2016-09-24 DIAGNOSIS — K219 Gastro-esophageal reflux disease without esophagitis: Secondary | ICD-10-CM | POA: Diagnosis not present

## 2016-09-28 DIAGNOSIS — K219 Gastro-esophageal reflux disease without esophagitis: Secondary | ICD-10-CM | POA: Diagnosis not present

## 2016-09-28 DIAGNOSIS — J449 Chronic obstructive pulmonary disease, unspecified: Secondary | ICD-10-CM | POA: Diagnosis not present

## 2016-09-28 DIAGNOSIS — S72144D Nondisplaced intertrochanteric fracture of right femur, subsequent encounter for closed fracture with routine healing: Secondary | ICD-10-CM | POA: Diagnosis not present

## 2016-09-28 DIAGNOSIS — F0390 Unspecified dementia without behavioral disturbance: Secondary | ICD-10-CM | POA: Diagnosis not present

## 2016-09-28 DIAGNOSIS — G2581 Restless legs syndrome: Secondary | ICD-10-CM | POA: Diagnosis not present

## 2016-09-28 DIAGNOSIS — Z9181 History of falling: Secondary | ICD-10-CM | POA: Diagnosis not present

## 2016-09-29 DIAGNOSIS — J449 Chronic obstructive pulmonary disease, unspecified: Secondary | ICD-10-CM | POA: Diagnosis not present

## 2016-09-29 DIAGNOSIS — Z9181 History of falling: Secondary | ICD-10-CM | POA: Diagnosis not present

## 2016-09-29 DIAGNOSIS — K219 Gastro-esophageal reflux disease without esophagitis: Secondary | ICD-10-CM | POA: Diagnosis not present

## 2016-09-29 DIAGNOSIS — G2581 Restless legs syndrome: Secondary | ICD-10-CM | POA: Diagnosis not present

## 2016-09-29 DIAGNOSIS — F0390 Unspecified dementia without behavioral disturbance: Secondary | ICD-10-CM | POA: Diagnosis not present

## 2016-09-29 DIAGNOSIS — S72144D Nondisplaced intertrochanteric fracture of right femur, subsequent encounter for closed fracture with routine healing: Secondary | ICD-10-CM | POA: Diagnosis not present

## 2016-09-30 DIAGNOSIS — K219 Gastro-esophageal reflux disease without esophagitis: Secondary | ICD-10-CM | POA: Diagnosis not present

## 2016-09-30 DIAGNOSIS — Z9181 History of falling: Secondary | ICD-10-CM | POA: Diagnosis not present

## 2016-09-30 DIAGNOSIS — F0390 Unspecified dementia without behavioral disturbance: Secondary | ICD-10-CM | POA: Diagnosis not present

## 2016-09-30 DIAGNOSIS — J449 Chronic obstructive pulmonary disease, unspecified: Secondary | ICD-10-CM | POA: Diagnosis not present

## 2016-09-30 DIAGNOSIS — G2581 Restless legs syndrome: Secondary | ICD-10-CM | POA: Diagnosis not present

## 2016-09-30 DIAGNOSIS — S72144D Nondisplaced intertrochanteric fracture of right femur, subsequent encounter for closed fracture with routine healing: Secondary | ICD-10-CM | POA: Diagnosis not present

## 2016-10-01 DIAGNOSIS — G2581 Restless legs syndrome: Secondary | ICD-10-CM | POA: Diagnosis not present

## 2016-10-01 DIAGNOSIS — S72144D Nondisplaced intertrochanteric fracture of right femur, subsequent encounter for closed fracture with routine healing: Secondary | ICD-10-CM | POA: Diagnosis not present

## 2016-10-01 DIAGNOSIS — Z9181 History of falling: Secondary | ICD-10-CM | POA: Diagnosis not present

## 2016-10-01 DIAGNOSIS — F0390 Unspecified dementia without behavioral disturbance: Secondary | ICD-10-CM | POA: Diagnosis not present

## 2016-10-01 DIAGNOSIS — J449 Chronic obstructive pulmonary disease, unspecified: Secondary | ICD-10-CM | POA: Diagnosis not present

## 2016-10-01 DIAGNOSIS — K219 Gastro-esophageal reflux disease without esophagitis: Secondary | ICD-10-CM | POA: Diagnosis not present

## 2016-10-05 DIAGNOSIS — J449 Chronic obstructive pulmonary disease, unspecified: Secondary | ICD-10-CM | POA: Diagnosis not present

## 2016-10-05 DIAGNOSIS — Z9181 History of falling: Secondary | ICD-10-CM | POA: Diagnosis not present

## 2016-10-05 DIAGNOSIS — K219 Gastro-esophageal reflux disease without esophagitis: Secondary | ICD-10-CM | POA: Diagnosis not present

## 2016-10-05 DIAGNOSIS — S72144D Nondisplaced intertrochanteric fracture of right femur, subsequent encounter for closed fracture with routine healing: Secondary | ICD-10-CM | POA: Diagnosis not present

## 2016-10-05 DIAGNOSIS — G2581 Restless legs syndrome: Secondary | ICD-10-CM | POA: Diagnosis not present

## 2016-10-05 DIAGNOSIS — F0390 Unspecified dementia without behavioral disturbance: Secondary | ICD-10-CM | POA: Diagnosis not present

## 2016-10-06 DIAGNOSIS — S72144D Nondisplaced intertrochanteric fracture of right femur, subsequent encounter for closed fracture with routine healing: Secondary | ICD-10-CM | POA: Diagnosis not present

## 2016-10-06 DIAGNOSIS — S72111D Displaced fracture of greater trochanter of right femur, subsequent encounter for closed fracture with routine healing: Secondary | ICD-10-CM | POA: Diagnosis not present

## 2016-10-06 DIAGNOSIS — S72112A Displaced fracture of greater trochanter of left femur, initial encounter for closed fracture: Secondary | ICD-10-CM | POA: Diagnosis not present

## 2016-10-06 DIAGNOSIS — M160 Bilateral primary osteoarthritis of hip: Secondary | ICD-10-CM | POA: Diagnosis not present

## 2016-10-06 DIAGNOSIS — M85852 Other specified disorders of bone density and structure, left thigh: Secondary | ICD-10-CM | POA: Diagnosis not present

## 2016-10-06 DIAGNOSIS — M85851 Other specified disorders of bone density and structure, right thigh: Secondary | ICD-10-CM | POA: Diagnosis not present

## 2016-10-07 DIAGNOSIS — K219 Gastro-esophageal reflux disease without esophagitis: Secondary | ICD-10-CM | POA: Diagnosis not present

## 2016-10-07 DIAGNOSIS — G2581 Restless legs syndrome: Secondary | ICD-10-CM | POA: Diagnosis not present

## 2016-10-07 DIAGNOSIS — J449 Chronic obstructive pulmonary disease, unspecified: Secondary | ICD-10-CM | POA: Diagnosis not present

## 2016-10-07 DIAGNOSIS — Z9181 History of falling: Secondary | ICD-10-CM | POA: Diagnosis not present

## 2016-10-07 DIAGNOSIS — S72144D Nondisplaced intertrochanteric fracture of right femur, subsequent encounter for closed fracture with routine healing: Secondary | ICD-10-CM | POA: Diagnosis not present

## 2016-10-07 DIAGNOSIS — F0390 Unspecified dementia without behavioral disturbance: Secondary | ICD-10-CM | POA: Diagnosis not present

## 2016-10-08 DIAGNOSIS — K219 Gastro-esophageal reflux disease without esophagitis: Secondary | ICD-10-CM | POA: Diagnosis not present

## 2016-10-08 DIAGNOSIS — J449 Chronic obstructive pulmonary disease, unspecified: Secondary | ICD-10-CM | POA: Diagnosis not present

## 2016-10-08 DIAGNOSIS — S72144D Nondisplaced intertrochanteric fracture of right femur, subsequent encounter for closed fracture with routine healing: Secondary | ICD-10-CM | POA: Diagnosis not present

## 2016-10-08 DIAGNOSIS — G2581 Restless legs syndrome: Secondary | ICD-10-CM | POA: Diagnosis not present

## 2016-10-08 DIAGNOSIS — F0390 Unspecified dementia without behavioral disturbance: Secondary | ICD-10-CM | POA: Diagnosis not present

## 2016-10-08 DIAGNOSIS — Z9181 History of falling: Secondary | ICD-10-CM | POA: Diagnosis not present

## 2016-10-11 DIAGNOSIS — Z9181 History of falling: Secondary | ICD-10-CM | POA: Diagnosis not present

## 2016-10-11 DIAGNOSIS — G2581 Restless legs syndrome: Secondary | ICD-10-CM | POA: Diagnosis not present

## 2016-10-11 DIAGNOSIS — K219 Gastro-esophageal reflux disease without esophagitis: Secondary | ICD-10-CM | POA: Diagnosis not present

## 2016-10-11 DIAGNOSIS — F0390 Unspecified dementia without behavioral disturbance: Secondary | ICD-10-CM | POA: Diagnosis not present

## 2016-10-11 DIAGNOSIS — J449 Chronic obstructive pulmonary disease, unspecified: Secondary | ICD-10-CM | POA: Diagnosis not present

## 2016-10-11 DIAGNOSIS — S72144D Nondisplaced intertrochanteric fracture of right femur, subsequent encounter for closed fracture with routine healing: Secondary | ICD-10-CM | POA: Diagnosis not present

## 2016-10-12 DIAGNOSIS — K219 Gastro-esophageal reflux disease without esophagitis: Secondary | ICD-10-CM | POA: Diagnosis not present

## 2016-10-12 DIAGNOSIS — J449 Chronic obstructive pulmonary disease, unspecified: Secondary | ICD-10-CM | POA: Diagnosis not present

## 2016-10-12 DIAGNOSIS — G2581 Restless legs syndrome: Secondary | ICD-10-CM | POA: Diagnosis not present

## 2016-10-12 DIAGNOSIS — F0390 Unspecified dementia without behavioral disturbance: Secondary | ICD-10-CM | POA: Diagnosis not present

## 2016-10-12 DIAGNOSIS — S72144D Nondisplaced intertrochanteric fracture of right femur, subsequent encounter for closed fracture with routine healing: Secondary | ICD-10-CM | POA: Diagnosis not present

## 2016-10-12 DIAGNOSIS — Z9181 History of falling: Secondary | ICD-10-CM | POA: Diagnosis not present

## 2016-10-13 DIAGNOSIS — Z9181 History of falling: Secondary | ICD-10-CM | POA: Diagnosis not present

## 2016-10-13 DIAGNOSIS — S72144D Nondisplaced intertrochanteric fracture of right femur, subsequent encounter for closed fracture with routine healing: Secondary | ICD-10-CM | POA: Diagnosis not present

## 2016-10-13 DIAGNOSIS — G2581 Restless legs syndrome: Secondary | ICD-10-CM | POA: Diagnosis not present

## 2016-10-13 DIAGNOSIS — K219 Gastro-esophageal reflux disease without esophagitis: Secondary | ICD-10-CM | POA: Diagnosis not present

## 2016-10-13 DIAGNOSIS — J449 Chronic obstructive pulmonary disease, unspecified: Secondary | ICD-10-CM | POA: Diagnosis not present

## 2016-10-13 DIAGNOSIS — F0390 Unspecified dementia without behavioral disturbance: Secondary | ICD-10-CM | POA: Diagnosis not present

## 2016-10-18 DIAGNOSIS — G2581 Restless legs syndrome: Secondary | ICD-10-CM | POA: Diagnosis not present

## 2016-10-18 DIAGNOSIS — Z9181 History of falling: Secondary | ICD-10-CM | POA: Diagnosis not present

## 2016-10-18 DIAGNOSIS — J449 Chronic obstructive pulmonary disease, unspecified: Secondary | ICD-10-CM | POA: Diagnosis not present

## 2016-10-18 DIAGNOSIS — K219 Gastro-esophageal reflux disease without esophagitis: Secondary | ICD-10-CM | POA: Diagnosis not present

## 2016-10-18 DIAGNOSIS — F0390 Unspecified dementia without behavioral disturbance: Secondary | ICD-10-CM | POA: Diagnosis not present

## 2016-10-18 DIAGNOSIS — S72144D Nondisplaced intertrochanteric fracture of right femur, subsequent encounter for closed fracture with routine healing: Secondary | ICD-10-CM | POA: Diagnosis not present

## 2016-10-19 DIAGNOSIS — S72144D Nondisplaced intertrochanteric fracture of right femur, subsequent encounter for closed fracture with routine healing: Secondary | ICD-10-CM | POA: Diagnosis not present

## 2016-10-19 DIAGNOSIS — F0390 Unspecified dementia without behavioral disturbance: Secondary | ICD-10-CM | POA: Diagnosis not present

## 2016-10-19 DIAGNOSIS — G2581 Restless legs syndrome: Secondary | ICD-10-CM | POA: Diagnosis not present

## 2016-10-19 DIAGNOSIS — J449 Chronic obstructive pulmonary disease, unspecified: Secondary | ICD-10-CM | POA: Diagnosis not present

## 2016-10-19 DIAGNOSIS — Z9181 History of falling: Secondary | ICD-10-CM | POA: Diagnosis not present

## 2016-10-19 DIAGNOSIS — K219 Gastro-esophageal reflux disease without esophagitis: Secondary | ICD-10-CM | POA: Diagnosis not present

## 2016-10-20 DIAGNOSIS — J449 Chronic obstructive pulmonary disease, unspecified: Secondary | ICD-10-CM | POA: Diagnosis not present

## 2016-10-20 DIAGNOSIS — K219 Gastro-esophageal reflux disease without esophagitis: Secondary | ICD-10-CM | POA: Diagnosis not present

## 2016-10-20 DIAGNOSIS — G2581 Restless legs syndrome: Secondary | ICD-10-CM | POA: Diagnosis not present

## 2016-10-20 DIAGNOSIS — S72144D Nondisplaced intertrochanteric fracture of right femur, subsequent encounter for closed fracture with routine healing: Secondary | ICD-10-CM | POA: Diagnosis not present

## 2016-10-20 DIAGNOSIS — F0390 Unspecified dementia without behavioral disturbance: Secondary | ICD-10-CM | POA: Diagnosis not present

## 2016-10-20 DIAGNOSIS — Z9181 History of falling: Secondary | ICD-10-CM | POA: Diagnosis not present

## 2016-10-22 DIAGNOSIS — K219 Gastro-esophageal reflux disease without esophagitis: Secondary | ICD-10-CM | POA: Diagnosis not present

## 2016-10-22 DIAGNOSIS — N401 Enlarged prostate with lower urinary tract symptoms: Secondary | ICD-10-CM | POA: Diagnosis not present

## 2016-10-22 DIAGNOSIS — Z79899 Other long term (current) drug therapy: Secondary | ICD-10-CM | POA: Diagnosis not present

## 2016-10-22 DIAGNOSIS — F0390 Unspecified dementia without behavioral disturbance: Secondary | ICD-10-CM | POA: Diagnosis not present

## 2016-10-22 DIAGNOSIS — J449 Chronic obstructive pulmonary disease, unspecified: Secondary | ICD-10-CM | POA: Diagnosis not present

## 2016-10-22 DIAGNOSIS — Z111 Encounter for screening for respiratory tuberculosis: Secondary | ICD-10-CM | POA: Diagnosis not present

## 2016-10-25 DIAGNOSIS — J449 Chronic obstructive pulmonary disease, unspecified: Secondary | ICD-10-CM | POA: Diagnosis not present

## 2016-10-25 DIAGNOSIS — G2581 Restless legs syndrome: Secondary | ICD-10-CM | POA: Diagnosis not present

## 2016-10-25 DIAGNOSIS — S72144D Nondisplaced intertrochanteric fracture of right femur, subsequent encounter for closed fracture with routine healing: Secondary | ICD-10-CM | POA: Diagnosis not present

## 2016-10-25 DIAGNOSIS — K219 Gastro-esophageal reflux disease without esophagitis: Secondary | ICD-10-CM | POA: Diagnosis not present

## 2016-10-25 DIAGNOSIS — F0390 Unspecified dementia without behavioral disturbance: Secondary | ICD-10-CM | POA: Diagnosis not present

## 2016-10-25 DIAGNOSIS — Z9181 History of falling: Secondary | ICD-10-CM | POA: Diagnosis not present

## 2016-10-26 DIAGNOSIS — K219 Gastro-esophageal reflux disease without esophagitis: Secondary | ICD-10-CM | POA: Diagnosis not present

## 2016-10-26 DIAGNOSIS — J449 Chronic obstructive pulmonary disease, unspecified: Secondary | ICD-10-CM | POA: Diagnosis not present

## 2016-10-26 DIAGNOSIS — S72144D Nondisplaced intertrochanteric fracture of right femur, subsequent encounter for closed fracture with routine healing: Secondary | ICD-10-CM | POA: Diagnosis not present

## 2016-10-26 DIAGNOSIS — G2581 Restless legs syndrome: Secondary | ICD-10-CM | POA: Diagnosis not present

## 2016-10-26 DIAGNOSIS — F0390 Unspecified dementia without behavioral disturbance: Secondary | ICD-10-CM | POA: Diagnosis not present

## 2016-10-26 DIAGNOSIS — Z9181 History of falling: Secondary | ICD-10-CM | POA: Diagnosis not present

## 2016-10-27 DIAGNOSIS — S72144D Nondisplaced intertrochanteric fracture of right femur, subsequent encounter for closed fracture with routine healing: Secondary | ICD-10-CM | POA: Diagnosis not present

## 2016-10-27 DIAGNOSIS — Z9181 History of falling: Secondary | ICD-10-CM | POA: Diagnosis not present

## 2016-10-27 DIAGNOSIS — F0390 Unspecified dementia without behavioral disturbance: Secondary | ICD-10-CM | POA: Diagnosis not present

## 2016-10-27 DIAGNOSIS — N400 Enlarged prostate without lower urinary tract symptoms: Secondary | ICD-10-CM | POA: Diagnosis not present

## 2016-10-27 DIAGNOSIS — J449 Chronic obstructive pulmonary disease, unspecified: Secondary | ICD-10-CM | POA: Diagnosis not present

## 2016-10-27 DIAGNOSIS — G2581 Restless legs syndrome: Secondary | ICD-10-CM | POA: Diagnosis not present

## 2016-10-27 DIAGNOSIS — K219 Gastro-esophageal reflux disease without esophagitis: Secondary | ICD-10-CM | POA: Diagnosis not present

## 2016-10-29 DIAGNOSIS — K219 Gastro-esophageal reflux disease without esophagitis: Secondary | ICD-10-CM | POA: Diagnosis not present

## 2016-10-29 DIAGNOSIS — F0390 Unspecified dementia without behavioral disturbance: Secondary | ICD-10-CM | POA: Diagnosis not present

## 2016-10-29 DIAGNOSIS — Z9181 History of falling: Secondary | ICD-10-CM | POA: Diagnosis not present

## 2016-10-29 DIAGNOSIS — S72144D Nondisplaced intertrochanteric fracture of right femur, subsequent encounter for closed fracture with routine healing: Secondary | ICD-10-CM | POA: Diagnosis not present

## 2016-10-29 DIAGNOSIS — J449 Chronic obstructive pulmonary disease, unspecified: Secondary | ICD-10-CM | POA: Diagnosis not present

## 2016-10-29 DIAGNOSIS — G2581 Restless legs syndrome: Secondary | ICD-10-CM | POA: Diagnosis not present

## 2016-10-31 ENCOUNTER — Encounter (HOSPITAL_COMMUNITY): Payer: Self-pay | Admitting: *Deleted

## 2016-10-31 ENCOUNTER — Emergency Department (HOSPITAL_COMMUNITY): Payer: Medicare Other

## 2016-10-31 ENCOUNTER — Inpatient Hospital Stay (HOSPITAL_COMMUNITY)
Admission: EM | Admit: 2016-10-31 | Discharge: 2016-11-02 | DRG: 178 | Disposition: A | Discharge status: Skilled Nursing Facility | Payer: Medicare Other | Attending: Internal Medicine | Admitting: Internal Medicine

## 2016-10-31 DIAGNOSIS — F0390 Unspecified dementia without behavioral disturbance: Secondary | ICD-10-CM | POA: Diagnosis present

## 2016-10-31 DIAGNOSIS — G8929 Other chronic pain: Secondary | ICD-10-CM | POA: Diagnosis present

## 2016-10-31 DIAGNOSIS — S72142S Displaced intertrochanteric fracture of left femur, sequela: Secondary | ICD-10-CM

## 2016-10-31 DIAGNOSIS — J189 Pneumonia, unspecified organism: Secondary | ICD-10-CM | POA: Diagnosis not present

## 2016-10-31 DIAGNOSIS — K219 Gastro-esophageal reflux disease without esophagitis: Secondary | ICD-10-CM | POA: Diagnosis present

## 2016-10-31 DIAGNOSIS — M25559 Pain in unspecified hip: Secondary | ICD-10-CM | POA: Diagnosis not present

## 2016-10-31 DIAGNOSIS — Z87442 Personal history of urinary calculi: Secondary | ICD-10-CM | POA: Diagnosis not present

## 2016-10-31 DIAGNOSIS — F329 Major depressive disorder, single episode, unspecified: Secondary | ICD-10-CM | POA: Diagnosis present

## 2016-10-31 DIAGNOSIS — Z79891 Long term (current) use of opiate analgesic: Secondary | ICD-10-CM | POA: Diagnosis not present

## 2016-10-31 DIAGNOSIS — E860 Dehydration: Secondary | ICD-10-CM | POA: Diagnosis present

## 2016-10-31 DIAGNOSIS — Z8781 Personal history of (healed) traumatic fracture: Secondary | ICD-10-CM

## 2016-10-31 DIAGNOSIS — J690 Pneumonitis due to inhalation of food and vomit: Secondary | ICD-10-CM | POA: Diagnosis not present

## 2016-10-31 DIAGNOSIS — X58XXXS Exposure to other specified factors, sequela: Secondary | ICD-10-CM | POA: Diagnosis present

## 2016-10-31 DIAGNOSIS — J449 Chronic obstructive pulmonary disease, unspecified: Secondary | ICD-10-CM

## 2016-10-31 DIAGNOSIS — Z880 Allergy status to penicillin: Secondary | ICD-10-CM | POA: Diagnosis not present

## 2016-10-31 DIAGNOSIS — R531 Weakness: Secondary | ICD-10-CM | POA: Diagnosis not present

## 2016-10-31 DIAGNOSIS — D649 Anemia, unspecified: Secondary | ICD-10-CM | POA: Diagnosis present

## 2016-10-31 DIAGNOSIS — N183 Chronic kidney disease, stage 3 unspecified: Secondary | ICD-10-CM

## 2016-10-31 DIAGNOSIS — N400 Enlarged prostate without lower urinary tract symptoms: Secondary | ICD-10-CM | POA: Diagnosis present

## 2016-10-31 DIAGNOSIS — Z91030 Bee allergy status: Secondary | ICD-10-CM | POA: Diagnosis not present

## 2016-10-31 DIAGNOSIS — Z79899 Other long term (current) drug therapy: Secondary | ICD-10-CM

## 2016-10-31 DIAGNOSIS — J410 Simple chronic bronchitis: Secondary | ICD-10-CM | POA: Diagnosis not present

## 2016-10-31 DIAGNOSIS — F17210 Nicotine dependence, cigarettes, uncomplicated: Secondary | ICD-10-CM | POA: Diagnosis present

## 2016-10-31 DIAGNOSIS — J154 Pneumonia due to other streptococci: Secondary | ICD-10-CM | POA: Diagnosis present

## 2016-10-31 DIAGNOSIS — G2581 Restless legs syndrome: Secondary | ICD-10-CM | POA: Diagnosis present

## 2016-10-31 DIAGNOSIS — J440 Chronic obstructive pulmonary disease with acute lower respiratory infection: Secondary | ICD-10-CM | POA: Diagnosis present

## 2016-10-31 DIAGNOSIS — Z91018 Allergy to other foods: Secondary | ICD-10-CM | POA: Diagnosis not present

## 2016-10-31 DIAGNOSIS — Z7982 Long term (current) use of aspirin: Secondary | ICD-10-CM

## 2016-10-31 DIAGNOSIS — R404 Transient alteration of awareness: Secondary | ICD-10-CM | POA: Diagnosis not present

## 2016-10-31 DIAGNOSIS — Y95 Nosocomial condition: Secondary | ICD-10-CM | POA: Diagnosis present

## 2016-10-31 DIAGNOSIS — R0902 Hypoxemia: Secondary | ICD-10-CM | POA: Diagnosis present

## 2016-10-31 DIAGNOSIS — S72112A Displaced fracture of greater trochanter of left femur, initial encounter for closed fracture: Secondary | ICD-10-CM | POA: Diagnosis not present

## 2016-10-31 DIAGNOSIS — F4489 Other dissociative and conversion disorders: Secondary | ICD-10-CM | POA: Diagnosis not present

## 2016-10-31 DIAGNOSIS — N189 Chronic kidney disease, unspecified: Secondary | ICD-10-CM

## 2016-10-31 DIAGNOSIS — R918 Other nonspecific abnormal finding of lung field: Secondary | ICD-10-CM | POA: Diagnosis not present

## 2016-10-31 HISTORY — DX: Restless legs syndrome: G25.81

## 2016-10-31 HISTORY — DX: Chronic obstructive pulmonary disease, unspecified: J44.9

## 2016-10-31 HISTORY — DX: Gastro-esophageal reflux disease without esophagitis: K21.9

## 2016-10-31 HISTORY — DX: Unspecified dementia, unspecified severity, without behavioral disturbance, psychotic disturbance, mood disturbance, and anxiety: F03.90

## 2016-10-31 HISTORY — DX: Benign prostatic hyperplasia without lower urinary tract symptoms: N40.0

## 2016-10-31 LAB — COMPREHENSIVE METABOLIC PANEL
ALBUMIN: 3 g/dL — AB (ref 3.5–5.0)
ALT: 9 U/L — ABNORMAL LOW (ref 17–63)
AST: 16 U/L (ref 15–41)
Alkaline Phosphatase: 79 U/L (ref 38–126)
Anion gap: 7 (ref 5–15)
BUN: 24 mg/dL — AB (ref 6–20)
CHLORIDE: 108 mmol/L (ref 101–111)
CO2: 26 mmol/L (ref 22–32)
Calcium: 8.2 mg/dL — ABNORMAL LOW (ref 8.9–10.3)
Creatinine, Ser: 1.39 mg/dL — ABNORMAL HIGH (ref 0.61–1.24)
GFR calc Af Amer: 54 mL/min — ABNORMAL LOW (ref >60)
GFR calc non Af Amer: 47 mL/min — ABNORMAL LOW (ref >60)
GLUCOSE: 119 mg/dL — AB (ref 65–99)
POTASSIUM: 4.4 mmol/L (ref 3.5–5.1)
Sodium: 141 mmol/L (ref 135–145)
Total Bilirubin: 0.5 mg/dL (ref 0.3–1.2)
Total Protein: 6.8 g/dL (ref 6.5–8.1)

## 2016-10-31 LAB — EXPECTORATED SPUTUM ASSESSMENT W REFEX TO RESP CULTURE

## 2016-10-31 LAB — URINALYSIS, ROUTINE W REFLEX MICROSCOPIC
Bilirubin Urine: NEGATIVE
Glucose, UA: NEGATIVE mg/dL
HGB URINE DIPSTICK: NEGATIVE
KETONES UR: NEGATIVE mg/dL
LEUKOCYTES UA: NEGATIVE
Nitrite: NEGATIVE
PROTEIN: NEGATIVE mg/dL
Specific Gravity, Urine: 1.008 (ref 1.005–1.030)
pH: 6 (ref 5.0–8.0)

## 2016-10-31 LAB — CBC WITH DIFFERENTIAL/PLATELET
Basophils Absolute: 0 10*3/uL (ref 0.0–0.1)
Basophils Relative: 0 %
EOS PCT: 1 %
Eosinophils Absolute: 0.1 10*3/uL (ref 0.0–0.7)
HCT: 32.2 % — ABNORMAL LOW (ref 39.0–52.0)
Hemoglobin: 10.4 g/dL — ABNORMAL LOW (ref 13.0–17.0)
LYMPHS ABS: 0.4 10*3/uL — AB (ref 0.7–4.0)
LYMPHS PCT: 3 %
MCH: 31 pg (ref 26.0–34.0)
MCHC: 32.3 g/dL (ref 30.0–36.0)
MCV: 95.8 fL (ref 78.0–100.0)
MONO ABS: 1 10*3/uL (ref 0.1–1.0)
MONOS PCT: 7 %
Neutro Abs: 12.5 10*3/uL — ABNORMAL HIGH (ref 1.7–7.7)
Neutrophils Relative %: 89 %
PLATELETS: 171 10*3/uL (ref 150–400)
RBC: 3.36 MIL/uL — AB (ref 4.22–5.81)
RDW: 16.1 % — AB (ref 11.5–15.5)
WBC: 13.9 10*3/uL — ABNORMAL HIGH (ref 4.0–10.5)

## 2016-10-31 LAB — MRSA PCR SCREENING: MRSA BY PCR: NEGATIVE

## 2016-10-31 MED ORDER — HYDROCODONE-ACETAMINOPHEN 5-325 MG PO TABS
2.0000 | ORAL_TABLET | ORAL | Status: DC | PRN
  Administered 2016-11-01 – 2016-11-02 (×2): 2 via ORAL
  Filled 2016-10-31 (×3): qty 2

## 2016-10-31 MED ORDER — DEXTROSE 5 % IV SOLN
1.0000 g | Freq: Three times a day (TID) | INTRAVENOUS | Status: DC
  Administered 2016-10-31 – 2016-11-01 (×3): 1 g via INTRAVENOUS
  Filled 2016-10-31 (×9): qty 1

## 2016-10-31 MED ORDER — VANCOMYCIN HCL IN DEXTROSE 750-5 MG/150ML-% IV SOLN
750.0000 mg | Freq: Two times a day (BID) | INTRAVENOUS | Status: DC
  Administered 2016-10-31 – 2016-11-01 (×2): 750 mg via INTRAVENOUS
  Filled 2016-10-31 (×4): qty 150

## 2016-10-31 MED ORDER — SODIUM CHLORIDE 0.9 % IV BOLUS (SEPSIS)
1000.0000 mL | Freq: Once | INTRAVENOUS | Status: AC
  Administered 2016-10-31: 1000 mL via INTRAVENOUS

## 2016-10-31 MED ORDER — SODIUM CHLORIDE 0.9 % IV BOLUS (SEPSIS)
250.0000 mL | Freq: Once | INTRAVENOUS | Status: AC
Start: 2016-10-31 — End: 2016-10-31
  Administered 2016-10-31: 250 mL via INTRAVENOUS

## 2016-10-31 MED ORDER — MEMANTINE HCL 10 MG PO TABS
10.0000 mg | ORAL_TABLET | Freq: Two times a day (BID) | ORAL | Status: DC
  Administered 2016-10-31 – 2016-11-02 (×5): 10 mg via ORAL
  Filled 2016-10-31 (×5): qty 1

## 2016-10-31 MED ORDER — SERTRALINE HCL 50 MG PO TABS
50.0000 mg | ORAL_TABLET | Freq: Every day | ORAL | Status: DC
  Administered 2016-10-31 – 2016-11-02 (×3): 50 mg via ORAL
  Filled 2016-10-31 (×3): qty 1

## 2016-10-31 MED ORDER — SODIUM CHLORIDE 0.9 % IV SOLN
INTRAVENOUS | Status: DC
  Administered 2016-10-31 – 2016-11-02 (×5): via INTRAVENOUS

## 2016-10-31 MED ORDER — VANCOMYCIN HCL IN DEXTROSE 1-5 GM/200ML-% IV SOLN
1000.0000 mg | Freq: Once | INTRAVENOUS | Status: AC
  Administered 2016-10-31: 1000 mg via INTRAVENOUS
  Filled 2016-10-31: qty 200

## 2016-10-31 MED ORDER — ALBUTEROL SULFATE (2.5 MG/3ML) 0.083% IN NEBU: 2.5000 mg | INHALATION_SOLUTION | RESPIRATORY_TRACT | Status: DC | PRN

## 2016-10-31 MED ORDER — FLUTICASONE PROPIONATE 50 MCG/ACT NA SUSP: 1.0000 | Freq: Every day | NASAL | Status: DC | PRN

## 2016-10-31 MED ORDER — ASPIRIN EC 81 MG PO TBEC
81.0000 mg | DELAYED_RELEASE_TABLET | Freq: Every day | ORAL | Status: DC
  Administered 2016-10-31 – 2016-11-02 (×3): 81 mg via ORAL
  Filled 2016-10-31 (×3): qty 1

## 2016-10-31 MED ORDER — PANTOPRAZOLE SODIUM 40 MG PO TBEC
80.0000 mg | DELAYED_RELEASE_TABLET | Freq: Every day | ORAL | Status: DC
  Administered 2016-10-31 – 2016-11-02 (×3): 80 mg via ORAL
  Filled 2016-10-31 (×4): qty 2

## 2016-10-31 MED ORDER — TRAZODONE HCL 50 MG PO TABS
200.0000 mg | ORAL_TABLET | Freq: Every day | ORAL | Status: DC
  Administered 2016-10-31 – 2016-11-01 (×2): 200 mg via ORAL
  Filled 2016-10-31 (×2): qty 4

## 2016-10-31 MED ORDER — ALBUTEROL SULFATE (2.5 MG/3ML) 0.083% IN NEBU
2.5000 mg | INHALATION_SOLUTION | Freq: Four times a day (QID) | RESPIRATORY_TRACT | Status: DC
  Administered 2016-10-31 – 2016-11-02 (×10): 2.5 mg via RESPIRATORY_TRACT
  Filled 2016-10-31 (×10): qty 3

## 2016-10-31 MED ORDER — BUPROPION HCL ER (SR) 150 MG PO TB12
150.0000 mg | ORAL_TABLET | Freq: Two times a day (BID) | ORAL | Status: DC
  Administered 2016-10-31 – 2016-11-02 (×5): 150 mg via ORAL
  Filled 2016-10-31 (×5): qty 1

## 2016-10-31 MED ORDER — DEXTROSE 5 % IV SOLN
2.0000 g | Freq: Once | INTRAVENOUS | Status: AC
  Administered 2016-10-31: 2 g via INTRAVENOUS
  Filled 2016-10-31: qty 2

## 2016-10-31 MED ORDER — PREDNISONE 20 MG PO TABS
40.0000 mg | ORAL_TABLET | Freq: Every day | ORAL | Status: DC
  Administered 2016-11-01 – 2016-11-02 (×2): 40 mg via ORAL
  Filled 2016-10-31 (×2): qty 2

## 2016-10-31 MED ORDER — PRAMIPEXOLE DIHYDROCHLORIDE 0.25 MG PO TABS
0.1250 mg | ORAL_TABLET | Freq: Every day | ORAL | Status: DC
  Administered 2016-10-31 – 2016-11-01 (×2): 0.125 mg via ORAL
  Filled 2016-10-31 (×3): qty 0.5

## 2016-10-31 MED ORDER — TAMSULOSIN HCL 0.4 MG PO CAPS
0.4000 mg | ORAL_CAPSULE | Freq: Every day | ORAL | Status: DC
  Administered 2016-10-31 – 2016-11-02 (×3): 0.4 mg via ORAL
  Filled 2016-10-31 (×3): qty 1

## 2016-10-31 MED ORDER — LEVOFLOXACIN IN D5W 750 MG/150ML IV SOLN
750.0000 mg | Freq: Once | INTRAVENOUS | Status: AC
  Administered 2016-10-31: 750 mg via INTRAVENOUS
  Filled 2016-10-31: qty 150

## 2016-10-31 MED ORDER — BACLOFEN 10 MG PO TABS
10.0000 mg | ORAL_TABLET | Freq: Two times a day (BID) | ORAL | Status: DC
  Administered 2016-10-31 – 2016-11-02 (×5): 10 mg via ORAL
  Filled 2016-10-31 (×5): qty 1

## 2016-10-31 MED ORDER — GABAPENTIN 400 MG PO CAPS
400.0000 mg | ORAL_CAPSULE | Freq: Three times a day (TID) | ORAL | Status: DC
  Administered 2016-10-31 – 2016-11-02 (×8): 400 mg via ORAL
  Filled 2016-10-31 (×8): qty 1

## 2016-10-31 MED ORDER — LEVOFLOXACIN IN D5W 750 MG/150ML IV SOLN: 750.0000 mg | INTRAVENOUS | Status: DC

## 2016-10-31 NOTE — ED Notes (Signed)
Levaquin not to be given at this time until hospitalist is able to consult with pharmacy-per Dr Vista Lawman.

## 2016-10-31 NOTE — ED Provider Notes (Signed)
[facility] DEPT Provider Note   CSN: [id number] Arrival date & time: 10/31/16  0325     History   Chief Complaint Chief Complaint  Patient presents with  . Fever    HPI Anthony [name] is a 76 y.o. male.  Level V caveat for dementia. Patient from nursing facility where they noted him to have a fever tonight of 102. Complaining of chronic hip pain. Denies any falls or trauma. Has a chronic cough at baseline from COPD that is nonproductive. No vomiting or diarrhea. No chest pain or shortness of breath. No abdominal pain. Patient states his hip pain is chronic and unchanged.   The history is provided by the patient.  Fever      Past Medical History:  Diagnosis Date  . Acute pyelonephritis 12/23/2014  . Asthma   . BPH (benign prostatic hyperplasia)   . Chronic back pain   . Chronic hip pain   . COPD (chronic obstructive pulmonary disease) ([place])   . Dementia   . Depression   . GERD (gastroesophageal reflux disease)   . Kidney stone   . Neuropathy   . Pulmonary nodules 12/25/2014  . Restless leg syndrome     Patient Active Problem List   Diagnosis Date Noted  . Hyponatremia 12/25/2014  . Hypokalemia 12/25/2014  . Normocytic anemia 12/25/2014  . Pulmonary nodules 12/25/2014  . Tobacco abuse 12/25/2014  . ARF (acute renal failure) ([place]) 12/24/2014  . Acute pyelonephritis 12/23/2014    Past Surgical History:  Procedure Laterality Date  . ABDOMINAL SURGERY     heria  . EYE SURGERY         Home Medications    Prior to Admission medications   Medication Sig Start Date End Date Taking? Authorizing Provider  albuterol (PROVENTIL HFA;VENTOLIN HFA) 108 (90 BASE) MCG/ACT inhaler Inhale 2 puffs into the lungs every 4 (four) hours as needed for wheezing or shortness of breath.   Yes [provider]  aspirin EC 81 MG tablet Take 81 mg by mouth daily.   Yes [provider]  baclofen (LIORESAL) 10 MG tablet Take 10 mg by mouth 2 (two) times daily.    Yes [provider]  buPROPion (WELLBUTRIN SR) 150 MG 12 hr tablet Take 150 mg by mouth 2 (two) times daily.   Yes [provider]  cyanocobalamin 500 MCG tablet Take 1,000 mcg by mouth daily.   Yes [provider]  fluticasone (FLONASE) 50 MCG/ACT nasal spray Place 1 spray into both nostrils daily as needed for allergies or rhinitis.   Yes [provider]  gabapentin (NEURONTIN) 400 MG capsule Take 400 mg by mouth 3 (three) times daily.   Yes [provider]  memantine (NAMENDA) 10 MG tablet Take 10 mg by mouth 2 (two) times daily.   Yes [provider]  omeprazole (PRILOSEC) 20 MG capsule Take 20 mg by mouth daily.   Yes [provider]  pramipexole (MIRAPEX) 0.125 MG tablet Take 0.125 mg by mouth at bedtime.   Yes [provider]  sertraline (ZOLOFT) 50 MG tablet Take 50 mg by mouth daily.   Yes [provider]  tamsulosin (FLOMAX) 0.4 MG CAPS capsule Take 0.4 mg by mouth daily as needed.    Yes [provider]  traMADol (ULTRAM) 50 MG tablet Take 50 mg by mouth daily as needed for moderate pain.   Yes [provider]  traZODone (DESYREL) 100 MG tablet Take 200 mg by mouth at bedtime.  Yes [provider]  HYDROcodone-acetaminophen (NORCO/VICODIN) 5-325 MG tablet Take 2 tablets by mouth every 4 (four) hours as needed. 09/18/16   Noemi Chapel, MD    Family History No family history on file.  Social History Social History  Substance Use Topics  . Smoking status: Current Every Day Smoker    Packs/day: 0.50    Types: Cigarettes  . Smokeless tobacco: Never Used  . Alcohol use No     Allergies   Bee venom; Penicillins; and Strawberry extract   Review of Systems Review of Systems  Unable to perform ROS: Dementia  Constitutional: Positive for fever.     Physical Exam Updated Vital Signs BP (!) 104/53 (BP Location: Left Arm)   Pulse 84   Temp 100.1 F (37.8 C) (Rectal)    Resp 20   Ht 6\' 2"  (1.88 m)   Wt 73.9 kg (162 lb 14.7 oz)   SpO2 92%   BMI 20.92 kg/m   Physical Exam  Constitutional: He is oriented to person, place, and time. He appears well-developed and well-nourished. No distress.  HENT:  Head: Normocephalic and atraumatic.  Mouth/Throat: Oropharynx is clear and moist. No oropharyngeal exudate.  Dry mucus membranes  Eyes: Pupils are equal, round, and reactive to light. Conjunctivae and EOM are normal.  Neck: Normal range of motion. Neck supple.  No meningismus.  Cardiovascular: Normal rate, regular rhythm, normal heart sounds and intact distal pulses.   No murmur heard. Pulmonary/Chest: Effort normal and breath sounds normal. No respiratory distress.  Decreased breath sounds at bases  Abdominal: Soft. There is no tenderness. There is no rebound and no guarding.  Genitourinary:  Genitourinary Comments: No gross blood on rectal exam  Musculoskeletal: Normal range of motion. He exhibits no edema or tenderness.  FROM hips without pain  Neurological: He is alert and oriented to person, place, and time. No cranial nerve deficit. He exhibits normal muscle tone. Coordination normal.  No ataxia on finger to nose bilaterally. No pronator drift. 5/5 strength throughout. CN 2-12 intact.Equal grip strength. Sensation intact.   Skin: Skin is warm. Capillary refill takes less than 2 seconds.  No sacral wounds  Psychiatric: He has a normal mood and affect. His behavior is normal.  Nursing note and vitals reviewed.    ED Treatments / Results  Labs (all labs ordered are listed, but only abnormal results are displayed) Labs Reviewed  COMPREHENSIVE METABOLIC PANEL - Abnormal; Notable for the following:       Result Value   Glucose, Bld 119 (*)    BUN 24 (*)    Creatinine, Ser 1.39 (*)    Calcium 8.2 (*)    Albumin 3.0 (*)    ALT 9 (*)    GFR calc non Af Amer 47 (*)    GFR calc Af Amer 54 (*)    All other components within normal limits  CBC WITH  DIFFERENTIAL/PLATELET - Abnormal; Notable for the following:    WBC 13.9 (*)    RBC 3.36 (*)    Hemoglobin 10.4 (*)    HCT 32.2 (*)    RDW 16.1 (*)    Neutro Abs 12.5 (*)    Lymphs Abs 0.4 (*)    All other components within normal limits  URINALYSIS, ROUTINE W REFLEX MICROSCOPIC - Abnormal; Notable for the following:    Color, Urine STRAW (*)    All other components within normal limits  CULTURE, BLOOD (ROUTINE X 2)  CULTURE, BLOOD (ROUTINE X 2)  URINE  CULTURE  I-STAT CG4 LACTIC ACID, ED  I-STAT CG4 LACTIC ACID, ED    EKG  EKG Interpretation  Date/Time:  Sunday October 31 2016 03:44:28 EDT Ventricular Rate:  80 PR Interval:    QRS Duration: 111 QT Interval:  406 QTC Calculation: 457 R Axis:   -56 Text Interpretation:  Normal sinus rhythm LAD, consider left anterior fascicular block Anteroseptal infarct, age indeterminate Borderline ST depression, lateral leads Baseline wander in lead(s) V1 Artifact No previous ECGs available Confirmed by Ezequiel Essex [phone]) on 10/31/2016 4:45:57 AM       Radiology Dg Chest 2 View  Result Date: 10/31/2016 CLINICAL DATA:  Acute onset of fever.  Initial encounter. EXAM: CHEST  2 VIEW COMPARISON:  None. FINDINGS: The lungs are well-aerated. Vascular congestion is noted. Mild right basilar airspace opacity may reflect atelectasis or possibly mild pneumonia. There is no evidence of pleural effusion or pneumothorax. The heart is normal in size; the mediastinal contour is within normal limits. No acute osseous abnormalities are seen. IMPRESSION: Vascular congestion. Mild right basilar airspace opacity may reflect atelectasis or possibly mild pneumonia. Electronically Signed   By: Garald Balding M.D.   On: 10/31/2016 04:52   Dg Pelvis 1-2 Views  Result Date: 10/31/2016 CLINICAL DATA:  Acute onset of fever. Bilateral hip pain. Initial encounter. EXAM: PELVIS - 1-2 VIEW COMPARISON:  CT of the abdomen and pelvis from 12/23/2014, and left hip CT  performed 09/17/2016 FINDINGS: Mildly displaced fracture fragments are again noted at the left greater femoral trochanter, without definite healing. Hardware at the proximal right femur appears grossly intact, without evidence of loosening. Both femoral heads remain seated at their respective acetabula. Mild degenerative change is noted along the lower lumbar spine. The sacroiliac joints are unremarkable. The visualized bowel gas pattern is within normal limits. IMPRESSION: Mildly displaced fracture fragments again noted at the left greater femoral trochanter, without definite interval healing. No new fracture seen. Electronically Signed   By: Garald Balding M.D.   On: 10/31/2016 04:54    Procedures Procedures (including critical care time)  Medications Ordered in ED Medications  sodium chloride 0.9 % bolus 1,000 mL (not administered)    And  sodium chloride 0.9 % bolus 1,000 mL (not administered)    And  sodium chloride 0.9 % bolus 250 mL (not administered)  levofloxacin (LEVAQUIN) IVPB 750 mg (not administered)  aztreonam (AZACTAM) 2 g in dextrose 5 % 50 mL IVPB (not administered)  vancomycin (VANCOCIN) IVPB 1000 mg/200 mL premix (not administered)     Initial Impression / Assessment and Plan / ED Course  I have reviewed the triage vital signs and the nursing notes.  Pertinent labs & imaging results that were available during my care of the patient were reviewed by me and considered in my medical decision making (see chart for details).     Patient from nursing home with fever. Has hypoxia on room air as well as cough. Denies any chest pain or shortness of breath. Has chronic hip pain that is unchanged. Denies chest pain or abdominal pain.   Labs, cultures, IVF, CXR, UA.  X-ray shows stable left greater trochanter fracture. X-ray concerning for pneumonia. We'll treat as HCAP Given patient's cough, fever and new oxygen requirement.  Sepsis labs obtained and IV antibiotics given. IV  fluids given.  Plan admission given his new hypoxia and fever with right-sided infiltrate. Discussed with Dr. Marin Comment. BP and mental status remain stable throughout ED course.     Final Clinical  Impressions(s) / ED Diagnoses   Final diagnoses:  HCAP (healthcare-associated pneumonia)    New Prescriptions New Prescriptions   No medications on file     Ezequiel Essex, MD 10/31/16 [phone]

## 2016-10-31 NOTE — ED Triage Notes (Signed)
Pt arrived by EMS from [facility]. EMS called out for a sick person. Per staff pt had fever of 102. Pt states his hips are hurting.

## 2016-10-31 NOTE — H&P (Signed)
History and Physical    Anthony [name] EXB:[id number] DOB: 1940/10/22 DOA: 10/31/2016  Referring MD/NP/PA:  PCP: Vidal Schwalbe, MD  Outpatient Specialists:  Patient coming from: Nursing facility  Chief Complaint: Fever  HPI: Anthony [name] is a 76 y.o. male with medical history significant for but not limited to COPD and dementia presenting with fever and cough. No known complaints of abdominal pain or chest pain nausea vomiting ED Course: At the ED patient was hemodynamically stable with a temperature 100.28F. X-ray of the chest noted right basilar infiltrate concerning for pneumonia. Patient is penicillin allergic and was given IV aztreonam and vancomycin for healthcare acquired pneumonia is admitted for further management  Review of Systems: Unable to volunteer  Past Medical History:  Diagnosis Date  . Acute pyelonephritis 12/23/2014  . Asthma   . BPH (benign prostatic hyperplasia)   . Chronic back pain   . Chronic hip pain   . COPD (chronic obstructive pulmonary disease) ([place])   . Dementia   . Depression   . GERD (gastroesophageal reflux disease)   . Kidney stone   . Neuropathy   . Pulmonary nodules 12/25/2014  . Restless leg syndrome     Past Surgical History:  Procedure Laterality Date  . ABDOMINAL SURGERY     heria  . EYE SURGERY       reports that he has been smoking Cigarettes.  He has been smoking about 0.50 packs per day. He has never used smokeless tobacco. He reports that he does not drink alcohol or use drugs.    History reviewed. No pertinent family history.   Prior to Admission medications   Medication Sig Start Date End Date Taking? Authorizing Provider  albuterol (PROVENTIL HFA;VENTOLIN HFA) 108 (90 BASE) MCG/ACT inhaler Inhale 2 puffs into the lungs every 4 (four) hours as needed for wheezing or shortness of breath.   Yes [provider]  aspirin EC 81 MG tablet Take 81 mg by mouth daily.   Yes [provider]  baclofen (LIORESAL) 10  MG tablet Take 10 mg by mouth 2 (two) times daily.   Yes [provider]  buPROPion (WELLBUTRIN SR) 150 MG 12 hr tablet Take 150 mg by mouth 2 (two) times daily.   Yes [provider]  cyanocobalamin 500 MCG tablet Take 1,000 mcg by mouth daily.   Yes [provider]  fluticasone (FLONASE) 50 MCG/ACT nasal spray Place 1 spray into both nostrils daily as needed for allergies or rhinitis.   Yes [provider]  gabapentin (NEURONTIN) 400 MG capsule Take 400 mg by mouth 3 (three) times daily.   Yes [provider]  memantine (NAMENDA) 10 MG tablet Take 10 mg by mouth 2 (two) times daily.   Yes [provider]  omeprazole (PRILOSEC) 20 MG capsule Take 20 mg by mouth daily.   Yes [provider]  pramipexole (MIRAPEX) 0.125 MG tablet Take 0.125 mg by mouth at bedtime.   Yes [provider]  sertraline (ZOLOFT) 50 MG tablet Take 50 mg by mouth daily.   Yes [provider]  tamsulosin (FLOMAX) 0.4 MG CAPS capsule Take 0.4 mg by mouth daily as needed.    Yes [provider]  traMADol (ULTRAM) 50 MG tablet Take 50 mg by mouth daily as needed for moderate pain.   Yes [provider]  traZODone (DESYREL) 100 MG tablet Take 200 mg by mouth at bedtime.   Yes [provider]  HYDROcodone-acetaminophen (NORCO/VICODIN) 5-325 MG tablet Take 2  tablets by mouth every 4 (four) hours as needed. 09/18/16   Noemi Chapel, MD    Physical Exam: Vitals:   10/31/16 0500 10/31/16 0530 10/31/16 0600 10/31/16 0630  BP: (!) 101/53 (!) 102/52 (!) 108/48 (!) 103/55  Pulse: 69 68 78 72  Resp: 15 13 11 14   Temp:      TempSrc:      SpO2: 92% 93% 94% 91%  Weight:      Height:           Vitals:   10/31/16 0500 10/31/16 0530 10/31/16 0600 10/31/16 0630  BP: (!) 101/53 (!) 102/52 (!) 108/48 (!) 103/55  Pulse: 69 68 78 72  Resp: 15 13 11 14   Temp:      TempSrc:      SpO2: 92% 93% 94% 91%  Weight:      Height:       Constitutional: NAD, comfortable Eyes: PERRL, lids and conjunctivae normal ENMT: Mucous membranes are dry. Posterior pharynx clear of any exudate or lesions.  Neck: normal, supple, no masses, no thyromegaly Respiratory: Decreased breath sounds at the bases with occasional right rhonchi. Normal respiratory effort. No accessory muscle use.  Cardiovascular: Regular rate and rhythm, no murmurs / rubs / gallops. 2+ pedal pulses. No carotid bruits.  Abdomen: no tenderness, no masses palpated. No hepatosplenomegaly. Bowel sounds positive.  Musculoskeletal: no clubbing / cyanosis. No joint deformity upper and lower extremities. Good ROM, no contractures. Normal muscle tone.  Skin: no rashes, lesions, ulcers. No induration Neurologic: Alert oriented 2, moves all extremities, no obvious focal deficit  Psychiatric:  Normal mood.     Labs on Admission: I have personally reviewed following labs and imaging studies  CBC:  Recent Labs Lab 10/31/16 0347  WBC 13.9*  NEUTROABS 12.5*  HGB 10.4*  HCT 32.2*  MCV 95.8  PLT 937   Basic Metabolic Panel:  Recent Labs Lab 10/31/16 0347  NA 141  K 4.4  CL 108  CO2 26  GLUCOSE 119*  BUN 24*  CREATININE 1.39*  CALCIUM 8.2*   GFR: Estimated Creatinine Clearance: 45.8 mL/min (A) (by C-G formula based on SCr of 1.39 mg/dL (H)). Liver Function Tests:  Recent Labs Lab 10/31/16 0347  AST 16  ALT 9*  ALKPHOS 79  BILITOT 0.5  PROT 6.8  ALBUMIN 3.0*   No results for input(s): LIPASE, AMYLASE in the last 168 hours. No results for input(s): AMMONIA in the last 168 hours. Coagulation Profile: No results for input(s): INR, PROTIME in the last 168 hours. Cardiac Enzymes: No results for input(s): CKTOTAL, CKMB, CKMBINDEX, TROPONINI in the last 168 hours. BNP (last 3 results) No results for input(s): PROBNP in the last 8760 hours. HbA1C: No results for input(s): HGBA1C in the last 72 hours. CBG: No results for input(s): GLUCAP in the last  168 hours. Lipid Profile: No results for input(s): CHOL, HDL, LDLCALC, TRIG, CHOLHDL, LDLDIRECT in the last 72 hours. Thyroid Function Tests: No results for input(s): TSH, T4TOTAL, FREET4, T3FREE, THYROIDAB in the last 72 hours. Anemia Panel: No results for input(s): VITAMINB12, FOLATE, FERRITIN, TIBC, IRON, RETICCTPCT in the last 72 hours. Urine analysis:    Component Value Date/Time   COLORURINE STRAW (A) 10/31/2016 0530   APPEARANCEUR CLEAR 10/31/2016 0530   LABSPEC 1.008 10/31/2016 0530   PHURINE 6.0 10/31/2016 0530   GLUCOSEU NEGATIVE 10/31/2016 0530   HGBUR NEGATIVE 10/31/2016 0530   BILIRUBINUR NEGATIVE 10/31/2016 0530   KETONESUR NEGATIVE 10/31/2016 0530   PROTEINUR NEGATIVE 10/31/2016  0530   UROBILINOGEN 1.0 12/23/2014 1853   NITRITE NEGATIVE 10/31/2016 0530   LEUKOCYTESUR NEGATIVE 10/31/2016 0530   Sepsis Labs: @LABRCNTIP (procalcitonin:4,lacticidven:4) ) Recent Results (from the past 240 hour(s))  Blood Culture (routine x 2)     Status: None (Preliminary result)   Collection Time: 10/31/16  4:13 AM  Result Value Ref Range Status   Specimen Description BLOOD LEFT ARM  Final   Special Requests   Final    BOTTLES DRAWN AEROBIC AND ANAEROBIC Blood Culture adequate volume   Culture PENDING  Incomplete   Report Status PENDING  Incomplete  Blood Culture (routine x 2)     Status: None (Preliminary result)   Collection Time: 10/31/16  4:13 AM  Result Value Ref Range Status   Specimen Description BLOOD RIGHT ARM  Final   Special Requests   Final    BOTTLES DRAWN AEROBIC AND ANAEROBIC Blood Culture adequate volume   Culture PENDING  Incomplete   Report Status PENDING  Incomplete     Radiological Exams on Admission: Dg Chest 2 View  Result Date: 10/31/2016 CLINICAL DATA:  Acute onset of fever.  Initial encounter. EXAM: CHEST  2 VIEW COMPARISON:  None. FINDINGS: The lungs are well-aerated. Vascular congestion is noted. Mild right basilar airspace opacity may reflect  atelectasis or possibly mild pneumonia. There is no evidence of pleural effusion or pneumothorax. The heart is normal in size; the mediastinal contour is within normal limits. No acute osseous abnormalities are seen. IMPRESSION: Vascular congestion. Mild right basilar airspace opacity may reflect atelectasis or possibly mild pneumonia. Electronically Signed   By: Garald Balding M.D.   On: 10/31/2016 04:52   Dg Pelvis 1-2 Views  Result Date: 10/31/2016 CLINICAL DATA:  Acute onset of fever. Bilateral hip pain. Initial encounter. EXAM: PELVIS - 1-2 VIEW COMPARISON:  CT of the abdomen and pelvis from 12/23/2014, and left hip CT performed 09/17/2016 FINDINGS: Mildly displaced fracture fragments are again noted at the left greater femoral trochanter, without definite healing. Hardware at the proximal right femur appears grossly intact, without evidence of loosening. Both femoral heads remain seated at their respective acetabula. Mild degenerative change is noted along the lower lumbar spine. The sacroiliac joints are unremarkable. The visualized bowel gas pattern is within normal limits. IMPRESSION: Mildly displaced fracture fragments again noted at the left greater femoral trochanter, without definite interval healing. No new fracture seen. Electronically Signed   By: Garald Balding M.D.   On: 10/31/2016 04:54    EKG: Independently reviewed.  Assessment/Plan Principal Problem:   HCAP (healthcare-associated pneumonia) Active Problems:   Normocytic anemia   CKD (chronic kidney disease)   COPD (chronic obstructive pulmonary disease) ([facility])   #1 HCAP/COPD: Patient allergic IV antibiotics Expectorants/mucolytic/bronchodilator nebulizations takes as needed Sputum culture Blood culture  #2 dehydration: Gentle IV fluid rehydration Monitor renal function and electrolytes  #3 renal insufficiency: Monitor renal function with electrolytes Gentle hydration  #4 left hip fracture: Not new Mildly  displaced Supportive care Consider orthopedic review was hospital versus outpatient follow-up    DVT prophylaxis: Heparin Code Status: (Full) Family Communication:  Disposition Plan: SNF Consults called:  Admission status: (inpatient / tele )   OSEI-BONSU,Ryliegh Mcduffey MD [facility] Pager [phone]  If 7PM-7AM, please contact night-coverage [email or web address] Password [facility]  10/31/2016, 7:26 AM

## 2016-10-31 NOTE — Progress Notes (Signed)
Pharmacy Antibiotic Note  Anthony [name] is a 76 y.o. male admitted on 10/31/2016 with sepsis.  Pharmacy has been consulted for Azactam, Vancomycin and Levaquin dosing. Azactam 2 GM IV, Vancomycin 1 GM IV and Levaquin 750 mg IV given in ED  Plan: Azactam 1 GM IV every 8 hours Levaquin 750 mg IV every 48 hours Vancomycin 750 mg IV every 12 hours Labs per protocol F/U cultures  Height: 6\' 2"  (188 cm) Weight: 162 lb 14.7 oz (73.9 kg) IBW/kg (Calculated) : 82.2  Temp (24hrs), Avg:99.8 F (37.7 C), Min:99.5 F (37.5 C), Max:100.1 F (37.8 C)   Recent Labs Lab 10/31/16 0347  WBC 13.9*  CREATININE 1.39*    Estimated Creatinine Clearance: 45.8 mL/min (A) (by C-G formula based on SCr of 1.39 mg/dL (H)).      Antimicrobials this admission: Levaquin 8/19 >>  Vancomycin  8/19 >>  Azactam 8/19 >>    Thank you for allowing pharmacy to be a part of this patient's care.  Chriss Czar 10/31/2016 7:52 AM

## 2016-10-31 NOTE — ED Notes (Signed)
I stat lactic <2 (1.8) did not crossover to computer.

## 2016-11-01 DIAGNOSIS — N183 Chronic kidney disease, stage 3 (moderate): Secondary | ICD-10-CM

## 2016-11-01 DIAGNOSIS — J411 Mucopurulent chronic bronchitis: Secondary | ICD-10-CM

## 2016-11-01 DIAGNOSIS — D649 Anemia, unspecified: Secondary | ICD-10-CM

## 2016-11-01 LAB — COMPREHENSIVE METABOLIC PANEL
ALBUMIN: 2.5 g/dL — AB (ref 3.5–5.0)
ALK PHOS: 63 U/L (ref 38–126)
ALT: 8 U/L — AB (ref 17–63)
AST: 15 U/L (ref 15–41)
Anion gap: 5 (ref 5–15)
BUN: 20 mg/dL (ref 6–20)
CALCIUM: 8.1 mg/dL — AB (ref 8.9–10.3)
CO2: 26 mmol/L (ref 22–32)
CREATININE: 1.29 mg/dL — AB (ref 0.61–1.24)
Chloride: 112 mmol/L — ABNORMAL HIGH (ref 101–111)
GFR calc Af Amer: 60 mL/min — ABNORMAL LOW (ref >60)
GFR calc non Af Amer: 51 mL/min — ABNORMAL LOW (ref >60)
GLUCOSE: 102 mg/dL — AB (ref 65–99)
Potassium: 4.2 mmol/L (ref 3.5–5.1)
SODIUM: 143 mmol/L (ref 135–145)
Total Bilirubin: 0.3 mg/dL (ref 0.3–1.2)
Total Protein: 5.9 g/dL — ABNORMAL LOW (ref 6.5–8.1)

## 2016-11-01 LAB — CBC
HCT: 30.4 % — ABNORMAL LOW (ref 39.0–52.0)
Hemoglobin: 9.4 g/dL — ABNORMAL LOW (ref 13.0–17.0)
MCH: 30.3 pg (ref 26.0–34.0)
MCHC: 30.9 g/dL (ref 30.0–36.0)
MCV: 98.1 fL (ref 78.0–100.0)
PLATELETS: 153 10*3/uL (ref 150–400)
RBC: 3.1 MIL/uL — ABNORMAL LOW (ref 4.22–5.81)
RDW: 16.5 % — ABNORMAL HIGH (ref 11.5–15.5)
WBC: 6.8 10*3/uL (ref 4.0–10.5)

## 2016-11-01 LAB — URINE CULTURE: Culture: <10000 — AB

## 2016-11-01 LAB — CG4 I-STAT (LACTIC ACID): LACTIC ACID, VENOUS: 1.29 mmol/L (ref 0.5–1.9)

## 2016-11-01 LAB — HIV ANTIBODY (ROUTINE TESTING W REFLEX): HIV SCREEN 4TH GENERATION: NONREACTIVE

## 2016-11-01 LAB — STREP PNEUMONIAE URINARY ANTIGEN: Strep Pneumo Urinary Antigen: POSITIVE — AB

## 2016-11-01 MED ORDER — LEVOFLOXACIN 750 MG PO TABS
750.0000 mg | ORAL_TABLET | Freq: Every day | ORAL | Status: DC
  Administered 2016-11-01 – 2016-11-02 (×2): 750 mg via ORAL
  Filled 2016-11-01 (×2): qty 1

## 2016-11-01 MED ORDER — ENSURE ENLIVE PO LIQD
237.0000 mL | Freq: Two times a day (BID) | ORAL | Status: DC
  Administered 2016-11-02 (×2): 237 mL via ORAL

## 2016-11-01 MED ORDER — LEVOFLOXACIN IN D5W 750 MG/150ML IV SOLN
750.0000 mg | INTRAVENOUS | Status: DC
Start: 2016-11-01 — End: 2016-11-01

## 2016-11-01 MED ORDER — ENOXAPARIN SODIUM 40 MG/0.4ML ~~LOC~~ SOLN
40.0000 mg | SUBCUTANEOUS | Status: DC
  Administered 2016-11-01 – 2016-11-02 (×2): 40 mg via SUBCUTANEOUS
  Filled 2016-11-01 (×2): qty 0.4

## 2016-11-01 MED ORDER — SODIUM CHLORIDE 0.9 % IV BOLUS (SEPSIS)
250.0000 mL | Freq: Once | INTRAVENOUS | Status: AC
  Administered 2016-11-01: 250 mL via INTRAVENOUS

## 2016-11-01 NOTE — Progress Notes (Signed)
Initial Nutrition Assessment     INTERVENTION:  Ensure Enlive po BID, each supplement provides 350 kcal and 20 grams of protein   Recommend liberalize to regular diet   NUTRITION DIAGNOSIS:  Increased nutrient needs related to acute and chronic disease (sepsis and COPD) as evidenced by est needs and current practice standards.    GOAL:  Pt to meet >/= 90% of their estimated nutrition needs      MONITOR:  Po intake, labs and wt trends     REASON FOR ASSESSMENT:   Consult COPD Protocol  ASSESSMENT: the patent presents from [facility] with sepsis. He has hx of dementia, GERD, COPD and chronic hip pain.   His usual diet is Regular and he is able to feed himself. Unsure how advanced pt dementia is but he did report good intake and appetite.   He says, someone else usually prepares my breakfast and most often he eats cold cereal with lactaid milk. The ate sausage and eggs this morning 75% of his meal. He likes peanut butter sandwiches and says his daughter brings him food in at night.   Limited weight hx: it appears pt weight is down 6% in the past 6 weeks. His weight has ranged from between 160-180# over the past 4 years. He has some mild muscle loss clavicles, temporal and dorsal. No edema noted. The patient says he ambulates with a walker and uses a wheelchair.    Diet Order:  Diet Heart Room service appropriate? Yes; Fluid consistency: Thin  Skin:  Reviewed, no issues  Last BM:  8/19   Height:   Ht Readings from Last 1 Encounters:  10/31/16 6\' 3"  (1.905 m)    Weight:   Wt Readings from Last 1 Encounters:  10/31/16 161 lb 3.2 oz (73.1 kg)    Ideal Body Weight:   89 kg  BMI:  Body mass index is 20.15 kg/m.  Estimated Nutritional Needs:   Kcal:   2190-2309  Protein:   88-95 gr  Fluid:   >1.8 liters daily  EDUCATION NEEDS:  None identified due to altered cognitive status    Colman Cater MS,RD,CSG,LDN Office: [phone] Pager: [phone]

## 2016-11-01 NOTE — Progress Notes (Signed)
[facility] PROGRESS NOTE  Glenn Gullickson OMV:[id number] DOB: 1941-01-26 DOA: 10/31/2016   PCP: Vidal Schwalbe, MD     Assessment/Plan: Principal Problem:   HCAP (healthcare-associated pneumonia) Active Problems:   Normocytic anemia   CKD (chronic kidney disease)   COPD (chronic obstructive pulmonary disease) ([place])    Geoff Dacanay is a 76 y.o. male with medical history significant for but not limited to COPD and dementia presenting with fever and cough. No known complaints of abdominal pain or chest pain nausea vomiting ED Course: At the ED patient was hemodynamically stable with a temperature 100.65F. X-ray of the chest noted right basilar infiltrate concerning for pneumonia. Patient is penicillin allergic and was given IV aztreonam and vancomycin for healthcare acquired pneumonia is admitted for further management  Assessment and plan   #1 HCAP/COPD:, Suspect aspiration given location of the pneumonia Patient does have multiple allergies Discontinue his Bactrim, vancomycin, and switch to oral Levaquin Expectorants/mucolytic/bronchodilator nebulizations takes as needed Sputum culture Blood culture no growth so far  #2 dehydration: Gentle IV fluid rehydration Monitor renal function and electrolytes  #3 renal insufficiency: Monitor renal function with electrolytes Gentle hydration  #4 left hip fracture: Patient has not been able to work since is small in July, CT showed comminuted and partially avulsed greater trochanteric fracture of the femur, has not followed up with orthopedics since, repeat x-ray shows displaced fracture fragments, no new fracture Orthopedics Dr. Arther Abbott has been consulted    DVT prophylaxsis  lovenox   Code Status:  Full code    Family Communication: Discussed in detail with the patient, all imaging results, lab results explained to the patient   Disposition Plan:  1-2 days      Consultants:  None    Procedures:  None   Antibiotics: Anti-infectives    Start     Dose/Rate Route Frequency Ordered Stop   11/02/16 0500  levofloxacin (LEVAQUIN) IVPB 750 mg  Status:  Discontinued     750 mg 100 mL/hr over 90 Minutes Intravenous Every 48 hours 10/31/16 0749 10/31/16 1143   10/31/16 1800  vancomycin (VANCOCIN) IVPB 750 mg/150 ml premix  Status:  Discontinued     750 mg 150 mL/hr over 60 Minutes Intravenous Every 12 hours 10/31/16 0752 11/01/16 1020   10/31/16 1300  aztreonam (AZACTAM) 1 g in dextrose 5 % 50 mL IVPB  Status:  Discontinued     1 g 100 mL/hr over 30 Minutes Intravenous Every 8 hours 10/31/16 0751 11/01/16 1020   10/31/16 0400  levofloxacin (LEVAQUIN) IVPB 750 mg     750 mg 100 mL/hr over 90 Minutes Intravenous  Once 10/31/16 0358 10/31/16 0616   10/31/16 0400  aztreonam (AZACTAM) 2 g in dextrose 5 % 50 mL IVPB     2 g 100 mL/hr over 30 Minutes Intravenous  Once 10/31/16 0358 10/31/16 0516   10/31/16 0400  vancomycin (VANCOCIN) IVPB 1000 mg/200 mL premix     1,000 mg 200 mL/hr over 60 Minutes Intravenous  Once 10/31/16 0358 10/31/16 0620         HPI/Subjective: Continues to have a cough which is productive, no chest pain or shortness of breath, afebrile  Objective: Vitals:   10/31/16 2048 10/31/16 2145 11/01/16 0549 11/01/16 0753  BP:  (!) 107/52 (!) 90/40   Pulse:  77 69   Resp:  20 20   Temp:  97.7 F (36.5 C) 97.8 F (36.6 C)   TempSrc:  Oral Oral  SpO2: 96% 100% 95% 91%  Weight:      Height:        Intake/Output Summary (Last 24 hours) at 11/01/16 1021 Last data filed at 11/01/16 0840  Gross per 24 hour  Intake          3546.67 ml  Output             1100 ml  Net          2446.67 ml    Exam:  Examination:  General exam: Appears calm and comfortable  Respiratory system: Clear to auscultation. Respiratory effort normal. Cardiovascular system: S1 & S2 heard, RRR. No JVD, murmurs, rubs, gallops or clicks. No pedal edema. Gastrointestinal  system: Abdomen is nondistended, soft and nontender. No organomegaly or masses felt. Normal bowel sounds heard. Central nervous system: Alert and oriented. No focal neurological deficits. Extremities: Symmetric 5 x 5 power. Skin: No rashes, lesions or ulcers Psychiatry: Judgement and insight appear normal. Mood & affect appropriate.     Data Reviewed: I have personally reviewed following labs and imaging studies  Micro Results Recent Results (from the past 240 hour(s))  Blood Culture (routine x 2)     Status: None (Preliminary result)   Collection Time: 10/31/16  4:13 AM  Result Value Ref Range Status   Specimen Description BLOOD LEFT ARM  Final   Special Requests   Final    BOTTLES DRAWN AEROBIC AND ANAEROBIC Blood Culture adequate volume   Culture NO GROWTH 1 DAY  Final   Report Status PENDING  Incomplete  Blood Culture (routine x 2)     Status: None (Preliminary result)   Collection Time: 10/31/16  4:13 AM  Result Value Ref Range Status   Specimen Description BLOOD RIGHT ARM  Final   Special Requests   Final    BOTTLES DRAWN AEROBIC AND ANAEROBIC Blood Culture adequate volume   Culture NO GROWTH 1 DAY  Final   Report Status PENDING  Incomplete  Culture, sputum-assessment     Status: None   Collection Time: 10/31/16  3:30 PM  Result Value Ref Range Status   Specimen Description EXPECTORATED SPUTUM  Final   Special Requests NONE  Final   Sputum evaluation   Final    THIS SPECIMEN IS ACCEPTABLE FOR SPUTUM CULTURE Performed at [facility]    Report Status 10/31/2016 FINAL  Final  Culture, respiratory (NON-Expectorated)     Status: None (Preliminary result)   Collection Time: 10/31/16  3:30 PM  Result Value Ref Range Status   Specimen Description EXPECTORATED SPUTUM  Final   Special Requests NONE  Final   Gram Stain   Final    RARE WBC PRESENT,BOTH PMN AND MONONUCLEAR NO ORGANISMS SEEN Performed at [facility], [address], [place], [place] [address]     Culture PENDING  Incomplete   Report Status PENDING  Incomplete  MRSA PCR Screening     Status: None   Collection Time: 10/31/16  3:53 PM  Result Value Ref Range Status   MRSA by PCR NEGATIVE NEGATIVE Final    Comment:        The GeneXpert MRSA Assay (FDA approved for NASAL specimens only), is one component of a comprehensive MRSA colonization surveillance program. It is not intended to diagnose MRSA infection nor to guide or monitor treatment for MRSA infections.     Radiology Reports Dg Chest 2 View  Result Date: 10/31/2016 CLINICAL DATA:  Acute onset of fever.  Initial encounter.  EXAM: CHEST  2 VIEW COMPARISON:  None. FINDINGS: The lungs are well-aerated. Vascular congestion is noted. Mild right basilar airspace opacity may reflect atelectasis or possibly mild pneumonia. There is no evidence of pleural effusion or pneumothorax. The heart is normal in size; the mediastinal contour is within normal limits. No acute osseous abnormalities are seen. IMPRESSION: Vascular congestion. Mild right basilar airspace opacity may reflect atelectasis or possibly mild pneumonia. Electronically Signed   By: Garald Balding M.D.   On: 10/31/2016 04:52   Dg Pelvis 1-2 Views  Result Date: 10/31/2016 CLINICAL DATA:  Acute onset of fever. Bilateral hip pain. Initial encounter. EXAM: PELVIS - 1-2 VIEW COMPARISON:  CT of the abdomen and pelvis from 12/23/2014, and left hip CT performed 09/17/2016 FINDINGS: Mildly displaced fracture fragments are again noted at the left greater femoral trochanter, without definite healing. Hardware at the proximal right femur appears grossly intact, without evidence of loosening. Both femoral heads remain seated at their respective acetabula. Mild degenerative change is noted along the lower lumbar spine. The sacroiliac joints are unremarkable. The visualized bowel gas pattern is within normal limits. IMPRESSION: Mildly displaced fracture fragments again noted at the left greater  femoral trochanter, without definite interval healing. No new fracture seen. Electronically Signed   By: Garald Balding M.D.   On: 10/31/2016 04:54     CBC  Recent Labs Lab 10/31/16 0347 11/01/16 0714  WBC 13.9* 6.8  HGB 10.4* 9.4*  HCT 32.2* 30.4*  PLT 171 153  MCV 95.8 98.1  MCH 31.0 30.3  MCHC 32.3 30.9  RDW 16.1* 16.5*  LYMPHSABS 0.4*  --   MONOABS 1.0  --   EOSABS 0.1  --   BASOSABS 0.0  --     Chemistries   Recent Labs Lab 10/31/16 0347 11/01/16 0714  NA 141 143  K 4.4 4.2  CL 108 112*  CO2 26 26  GLUCOSE 119* 102*  BUN 24* 20  CREATININE 1.39* 1.29*  CALCIUM 8.2* 8.1*  AST 16 15  ALT 9* 8*  ALKPHOS 79 63  BILITOT 0.5 0.3   ------------------------------------------------------------------------------------------------------------------ estimated creatinine clearance is 48.8 mL/min (A) (by C-G formula based on SCr of 1.29 mg/dL (H)). ------------------------------------------------------------------------------------------------------------------ No results for input(s): HGBA1C in the last 72 hours. ------------------------------------------------------------------------------------------------------------------ No results for input(s): CHOL, HDL, LDLCALC, TRIG, CHOLHDL, LDLDIRECT in the last 72 hours. ------------------------------------------------------------------------------------------------------------------ No results for input(s): TSH, T4TOTAL, T3FREE, THYROIDAB in the last 72 hours.  Invalid input(s): FREET3 ------------------------------------------------------------------------------------------------------------------ No results for input(s): VITAMINB12, FOLATE, FERRITIN, TIBC, IRON, RETICCTPCT in the last 72 hours.  Coagulation profile No results for input(s): INR, PROTIME in the last 168 hours.  No results for input(s): DDIMER in the last 72 hours.  Cardiac Enzymes No results for input(s): CKMB, TROPONINI, MYOGLOBIN in the last 168  hours.  Invalid input(s): CK ------------------------------------------------------------------------------------------------------------------ Invalid input(s): POCBNP   CBG: No results for input(s): GLUCAP in the last 168 hours.     Studies: Dg Chest 2 View  Result Date: 10/31/2016 CLINICAL DATA:  Acute onset of fever.  Initial encounter. EXAM: CHEST  2 VIEW COMPARISON:  None. FINDINGS: The lungs are well-aerated. Vascular congestion is noted. Mild right basilar airspace opacity may reflect atelectasis or possibly mild pneumonia. There is no evidence of pleural effusion or pneumothorax. The heart is normal in size; the mediastinal contour is within normal limits. No acute osseous abnormalities are seen. IMPRESSION: Vascular congestion. Mild right basilar airspace opacity may reflect atelectasis or possibly mild pneumonia. Electronically Signed  By: Garald Balding M.D.   On: 10/31/2016 04:52   Dg Pelvis 1-2 Views  Result Date: 10/31/2016 CLINICAL DATA:  Acute onset of fever. Bilateral hip pain. Initial encounter. EXAM: PELVIS - 1-2 VIEW COMPARISON:  CT of the abdomen and pelvis from 12/23/2014, and left hip CT performed 09/17/2016 FINDINGS: Mildly displaced fracture fragments are again noted at the left greater femoral trochanter, without definite healing. Hardware at the proximal right femur appears grossly intact, without evidence of loosening. Both femoral heads remain seated at their respective acetabula. Mild degenerative change is noted along the lower lumbar spine. The sacroiliac joints are unremarkable. The visualized bowel gas pattern is within normal limits. IMPRESSION: Mildly displaced fracture fragments again noted at the left greater femoral trochanter, without definite interval healing. No new fracture seen. Electronically Signed   By: Garald Balding M.D.   On: 10/31/2016 04:54      No results found for: HGBA1C Lab Results  Component Value Date   CREATININE 1.29 (H)  11/01/2016       Scheduled Meds: . albuterol  2.5 mg Inhalation QID  . aspirin EC  81 mg Oral Daily  . baclofen  10 mg Oral BID  . buPROPion  150 mg Oral BID  . gabapentin  400 mg Oral TID  . memantine  10 mg Oral BID  . pantoprazole  80 mg Oral Daily  . pramipexole  0.125 mg Oral QHS  . predniSONE  40 mg Oral Q breakfast  . sertraline  50 mg Oral Daily  . tamsulosin  0.4 mg Oral Daily  . traZODone  200 mg Oral QHS   Continuous Infusions: . sodium chloride 100 mL/hr at 11/01/16 0205     LOS: 1 day    Time spent: >30 MINS    Reyne Dumas  [facility] Pager [phone]. If 7PM-7AM, please contact night-coverage at [email or web address], password [facility] 11/01/2016, 10:21 AM  LOS: 1 day

## 2016-11-01 NOTE — Evaluation (Signed)
Clinical/Bedside Swallow Evaluation Patient Details  Name: Anthony [name] MRN: [id number] Date of Birth: Jan 16, 1941  Today's Date: 11/01/2016 Time: SLP Start Time (ACUTE ONLY): 2706 SLP Stop Time (ACUTE ONLY): 1453 SLP Time Calculation (min) (ACUTE ONLY): 31 min  Past Medical History:  Past Medical History:  Diagnosis Date  . Acute pyelonephritis 12/23/2014  . Asthma   . BPH (benign prostatic hyperplasia)   . Chronic back pain   . Chronic hip pain   . COPD (chronic obstructive pulmonary disease) ([place])   . Dementia   . Depression   . GERD (gastroesophageal reflux disease)   . Kidney stone   . Neuropathy   . Pulmonary nodules 12/25/2014  . Restless leg syndrome    Past Surgical History:  Past Surgical History:  Procedure Laterality Date  . ABDOMINAL SURGERY     heria  . EYE SURGERY     HPI:  Anthony Smithis a 76 y.o.malewith medical history significant for but not limited to COPD and dementia presenting with fever and cough. No known complaints of abdominal pain or chest pain nausea vomiting. X-ray of the chest noted right basilar infiltrate concerning for pneumonia.    Assessment / Plan / Recommendation Clinical Impression  Pt presents with a suspected mild oropharyngeal dysphagia. Baseline vocal quality this date appeared mildy wet with suspected phlegm around larynx. Cues for throat clear and hard swallow did not change vocal quality. Pt with prolonged mastication of solid PO and delayed cough x1.  Oral cavity edentulous and pt states at baseline he uses dentures, though he is not the most reliable historian. Consecutive straw sips of thin liquids for 3 ounce water challenge without overt s/sx of aspiration. Recommend regular and thin liquids with medicines whole in puree. ST to follow up for diet tolerance.   SLP Visit Diagnosis: Dysphagia, unspecified (R13.10)    Aspiration Risk  Mild aspiration risk    Diet Recommendation   Regular thin liquids   Medication  Administration: Whole meds with puree    Other  Recommendations Oral Care Recommendations: Oral care BID   Follow up Recommendations 24 hour supervision/assistance      Frequency and Duration min 1 x/week  1 week       Prognosis Prognosis for Safe Diet Advancement: Good Barriers to Reach Goals: Cognitive deficits      Swallow Study   General Date of Onset: 10/31/16 HPI: Anthony Smithis a 76 y.o.malewith medical history significant for but not limited to COPD and dementia presenting with fever and cough. No known complaints of abdominal pain or chest pain nausea vomiting. X-ray of the chest noted right basilar infiltrate concerning for pneumonia.  Type of Study: Bedside Swallow Evaluation Previous Swallow Assessment: no prior ST intervention per epic Diet Prior to this Study: Regular;Thin liquids Temperature Spikes Noted: Yes Respiratory Status: Room air History of Recent Intubation: No Behavior/Cognition: Alert;Cooperative Oral Cavity Assessment: Dry Oral Care Completed by SLP: Yes Oral Cavity - Dentition: Dentures, not available Vision: Functional for self-feeding Self-Feeding Abilities: Needs set up Patient Positioning: Upright in bed Baseline Vocal Quality: Wet;Low vocal intensity Volitional Cough: Congested Volitional Swallow: Able to elicit    Oral/Motor/Sensory Function Overall Oral Motor/Sensory Function: Within functional limits   Ice Chips Ice chips: Impaired Presentation: Spoon Oral Phase Functional Implications: Prolonged oral transit Pharyngeal Phase Impairments: Suspected delayed Swallow;Multiple swallows   Thin Liquid Thin Liquid: Impaired Presentation: Cup;Straw Oral Phase Functional Implications: Prolonged oral transit Pharyngeal  Phase Impairments: Suspected delayed Swallow;Multiple swallows    Nectar Thick  Nectar Thick Liquid: Not tested   Honey Thick Honey Thick Liquid: Not tested   Puree Puree: Within functional limits   Solid   GO   Solid:  Impaired Presentation: Self Fed Oral Phase Impairments: Impaired mastication Oral Phase Functional Implications: Prolonged oral transit Pharyngeal Phase Impairments: Suspected delayed Swallow;Multiple swallows;Cough - Delayed       Arvil Chaco MA, CCC-SLP Acute Care Speech Language Pathologist    Levi Aland 11/01/2016,3:06 PM

## 2016-11-01 NOTE — Progress Notes (Signed)
Pharmacy Antibiotic Note  Anthony [name] is a 76 y.o. male admitted on 10/31/2016 with HCAP.   Pharmacy has been consulted for Levaquin ORAL dosing. Azactam 2 GM IV, Vancomycin 1 GM IV and Levaquin 750 mg IV given on 8/19  Plan: Levaquin 750 mg PO every day  Monitor SCR, renal fxn (borderline for dose reduction) Labs per protocol, F/U cultures  Height: 6\' 3"  (190.5 cm) Weight: 161 lb 3.2 oz (73.1 kg) IBW/kg (Calculated) : 84.5  Temp (24hrs), Avg:97.8 F (36.6 C), Min:97.7 F (36.5 C), Max:98 F (36.7 C)   Recent Labs Lab 10/31/16 0347 11/01/16 0714  WBC 13.9* 6.8  CREATININE 1.39* 1.29*    Estimated Creatinine Clearance: 48.8 mL/min (A) (by C-G formula based on SCr of 1.29 mg/dL (H)).    Antimicrobials this admission: Levaquin 8/19 >>  Vancomycin  8/19 >> 8/20 Azactam 8/19 >>8/20  Thank you for allowing pharmacy to be a part of this patient's care.  Hart Robinsons A 11/01/2016 10:42 AM

## 2016-11-02 DIAGNOSIS — J410 Simple chronic bronchitis: Secondary | ICD-10-CM

## 2016-11-02 DIAGNOSIS — Z8781 Personal history of (healed) traumatic fracture: Secondary | ICD-10-CM

## 2016-11-02 LAB — BASIC METABOLIC PANEL
ANION GAP: 5 (ref 5–15)
BUN: 23 mg/dL — ABNORMAL HIGH (ref 6–20)
CALCIUM: 8 mg/dL — AB (ref 8.9–10.3)
CO2: 23 mmol/L (ref 22–32)
Chloride: 114 mmol/L — ABNORMAL HIGH (ref 101–111)
Creatinine, Ser: 1.27 mg/dL — ABNORMAL HIGH (ref 0.61–1.24)
GFR, EST NON AFRICAN AMERICAN: 52 mL/min — AB (ref >60)
GLUCOSE: 117 mg/dL — AB (ref 65–99)
POTASSIUM: 4.6 mmol/L (ref 3.5–5.1)
SODIUM: 142 mmol/L (ref 135–145)

## 2016-11-02 LAB — CBC WITH DIFFERENTIAL/PLATELET
BASOS ABS: 0 10*3/uL (ref 0.0–0.1)
BASOS PCT: 0 %
Eosinophils Absolute: 0 10*3/uL (ref 0.0–0.7)
Eosinophils Relative: 1 %
HEMATOCRIT: 29.8 % — AB (ref 39.0–52.0)
HEMOGLOBIN: 9.3 g/dL — AB (ref 13.0–17.0)
LYMPHS PCT: 10 %
Lymphs Abs: 0.6 10*3/uL — ABNORMAL LOW (ref 0.7–4.0)
MCH: 30.6 pg (ref 26.0–34.0)
MCHC: 31.2 g/dL (ref 30.0–36.0)
MCV: 98 fL (ref 78.0–100.0)
Monocytes Absolute: 0.6 10*3/uL (ref 0.1–1.0)
Monocytes Relative: 9 %
NEUTROS ABS: 5.2 10*3/uL (ref 1.7–7.7)
NEUTROS PCT: 80 %
Platelets: 142 10*3/uL — ABNORMAL LOW (ref 150–400)
RBC: 3.04 MIL/uL — AB (ref 4.22–5.81)
RDW: 16.5 % — ABNORMAL HIGH (ref 11.5–15.5)
WBC: 6.4 10*3/uL (ref 4.0–10.5)

## 2016-11-02 MED ORDER — LEVOFLOXACIN 750 MG PO TABS: 750.0000 mg | ORAL_TABLET | Freq: Every day | ORAL | 0 refills | Status: AC

## 2016-11-02 MED ORDER — PREDNISONE 20 MG PO TABS: 40.0000 mg | ORAL_TABLET | Freq: Every day | ORAL | 0 refills | Status: AC

## 2016-11-02 MED ORDER — POLYETHYLENE GLYCOL 3350 17 G PO PACK: 17.0000 g | PACK | Freq: Every day | ORAL | 0 refills | Status: DC

## 2016-11-02 MED ORDER — HYDROCODONE-ACETAMINOPHEN 5-325 MG PO TABS: 1.0000 | ORAL_TABLET | Freq: Four times a day (QID) | ORAL | 0 refills | Status: DC | PRN

## 2016-11-02 MED ORDER — POLYETHYLENE GLYCOL 3350 17 G PO PACK: 17.0000 g | PACK | Freq: Every day | ORAL | Status: DC

## 2016-11-02 MED ORDER — ALBUTEROL SULFATE (2.5 MG/3ML) 0.083% IN NEBU
2.5000 mg | INHALATION_SOLUTION | Freq: Three times a day (TID) | RESPIRATORY_TRACT | Status: DC
  Filled 2016-11-02: qty 3

## 2016-11-02 NOTE — Clinical Social Work Note (Signed)
Clinical Social Work Assessment  Patient Details  Name: Anthony [name] MRN: [id number] Date of Birth: Feb 15, 1941  Date of referral:  11/02/16               Reason for consult:  Discharge Planning                Permission sought to share information with:    Permission granted to share information::     Name::        Agency::     Relationship::     Contact Information:     Housing/Transportation Living arrangements for the past 2 months:  [place] of Information:  Facility Patient Interpreter Needed:  None Criminal Activity/Legal Involvement Pertinent to Current Situation/Hospitalization:  No - Comment as needed Significant Relationships:  None Lives with:  Facility Resident Do you feel safe going back to the place where you live?  Yes Need for family participation in patient care:  No (Coment)  Care giving concerns:  None identified. Facility resident.     Social Worker assessment / plan:  Patient is from [place] as a result of a protective order from [place]. He is not under their guardianship. Patient receives assistance with ADLs from staff. Patient can return at discharge.   Employment status:  Retired Nurse, adult, Medicaid In [place] PT Recommendations:  Not assessed at this time Information / Referral to community resources:     Patient/Family's Response to care: DSS protective order has patient at the facility.   Patient/Family's Understanding of and Emotional Response to Diagnosis, Current Treatment, and Prognosis:  Patient is agreeable to return and understands diagnosis, treatment and prognosis.   Emotional Assessment Appearance:  Appears stated age Attitude/Demeanor/Rapport:    Affect (typically observed):  Unable to Assess Orientation:  Oriented to Self, Oriented to Place, Oriented to Situation, Oriented to  Time Alcohol / Substance use:  Not Applicable Psych involvement (Current and /or in the community):  No  (Comment)  Discharge Needs  Concerns to be addressed:  Discharge Planning Concerns Readmission within the last 30 days:  No Current discharge risk:  None Barriers to Discharge:  No Barriers Identified   Ihor Gully, LCSW 11/02/2016, 4:25 PM

## 2016-11-02 NOTE — NC FL2 (Signed)
[place] MEDICAID FL2 LEVEL OF CARE SCREENING TOOL     IDENTIFICATION  Patient Name: Anthony [name] Birthdate: [birth year] Sex: male Admission Date (Current Location): 10/31/2016  [facility] and [place] Number:  [employer] and Address:  [facility] [address] [place]      Provider Number: [id number]  Attending Physician Name and Address:  Reyne Dumas, MD  Relative Name and Phone Number:       Current Level of Care:   Recommended Level of Care: [place] Prior Approval Number:    Date Approved/Denied:   PASRR Number:    Discharge Plan: Other (Comment) ([facility])    Current Diagnoses: Patient Active Problem List   Diagnosis Date Noted  . S/p left hip fracture   . HCAP (healthcare-associated pneumonia) 10/31/2016  . CKD (chronic kidney disease) 10/31/2016  . COPD (chronic obstructive pulmonary disease) ([place]) 10/31/2016  . Hyponatremia 12/25/2014  . Hypokalemia 12/25/2014  . Normocytic anemia 12/25/2014  . Pulmonary nodules 12/25/2014  . Tobacco abuse 12/25/2014  . ARF (acute renal failure) ([place]) 12/24/2014  . Acute pyelonephritis 12/23/2014    Orientation RESPIRATION BLADDER Height & Weight     Self, Time, Situation, Place  Normal Continent Weight: 161 lb 3.2 oz (73.1 kg) Height:  6\' 3"  (190.5 cm)  BEHAVIORAL SYMPTOMS/MOOD NEUROLOGICAL BOWEL NUTRITION STATUS      Continent Diet (Heart Healthy)  AMBULATORY STATUS COMMUNICATION OF NEEDS Skin   Limited Assist Verbally Normal                       Personal Care Assistance Level of Assistance  Bathing, Dressing, Feeding Bathing Assistance: Limited assistance Feeding assistance: Limited assistance Dressing Assistance: Limited assistance     Functional Limitations Info  Sight, Hearing, Speech Sight Info: Adequate Hearing Info: Adequate Speech Info: Adequate    SPECIAL CARE FACTORS FREQUENCY  Speech therapy             Speech Therapy  Frequency: 3x/weel       Contractures Contractures Info: Not present    Additional Factors Info  Allergies   Allergies Info: Bee Venom, Penicillins, Strawberry Extract,            Current Medications (11/02/2016):  This is the current hospital active medication list Current Facility-Administered Medications  Medication Dose Route Frequency Provider Last Rate Last Dose  . 0.9 %  sodium chloride infusion   Intravenous Continuous Osei-Bonsu, Iona Beard, MD 100 mL/hr at 11/02/16 0732    . albuterol (PROVENTIL) (2.5 MG/3ML) 0.083% nebulizer solution 2.5 mg  2.5 mg Inhalation Q4H PRN Osei-Bonsu, George, MD      . albuterol (PROVENTIL) (2.5 MG/3ML) 0.083% nebulizer solution 2.5 mg  2.5 mg Inhalation QID Benito Mccreedy, MD   2.5 mg at 11/02/16 1129  . aspirin EC tablet 81 mg  81 mg Oral Daily Osei-Bonsu, George, MD   81 mg at 11/02/16 1026  . baclofen (LIORESAL) tablet 10 mg  10 mg Oral BID Benito Mccreedy, MD   10 mg at 11/02/16 1027  . buPROPion (WELLBUTRIN SR) 12 hr tablet 150 mg  150 mg Oral BID Osei-Bonsu, Iona Beard, MD   150 mg at 11/02/16 1026  . enoxaparin (LOVENOX) injection 40 mg  40 mg Subcutaneous Q24H Reyne Dumas, MD   40 mg at 11/02/16 1026  . feeding supplement (ENSURE ENLIVE) (ENSURE ENLIVE) liquid 237 mL  237 mL Oral BID BM Reyne Dumas, MD   237 mL at 11/02/16  1543  . fluticasone (FLONASE) 50 MCG/ACT nasal spray 1 spray  1 spray Each Nare Daily PRN Osei-Bonsu, George, MD      . gabapentin (NEURONTIN) capsule 400 mg  400 mg Oral TID Benito Mccreedy, MD   400 mg at 11/02/16 1544  . HYDROcodone-acetaminophen (NORCO/VICODIN) 5-325 MG per tablet 2 tablet  2 tablet Oral Q4H PRN Benito Mccreedy, MD   2 tablet at 11/02/16 0557  . levofloxacin (LEVAQUIN) tablet 750 mg  750 mg Oral Daily Reyne Dumas, MD   750 mg at 11/02/16 1026  . memantine (NAMENDA) tablet 10 mg  10 mg Oral BID Osei-Bonsu, Iona Beard, MD   10 mg at 11/02/16 1026  . pantoprazole (PROTONIX) EC tablet 80 mg  80 mg  Oral Daily Osei-Bonsu, Iona Beard, MD   80 mg at 11/02/16 1026  . pramipexole (MIRAPEX) tablet 0.125 mg  0.125 mg Oral QHS Osei-Bonsu, Iona Beard, MD   0.125 mg at 11/01/16 2203  . predniSONE (DELTASONE) tablet 40 mg  40 mg Oral Q breakfast Osei-Bonsu, Iona Beard, MD   40 mg at 11/02/16 0859  . sertraline (ZOLOFT) tablet 50 mg  50 mg Oral Daily Osei-Bonsu, Iona Beard, MD   50 mg at 11/02/16 1026  . tamsulosin (FLOMAX) capsule 0.4 mg  0.4 mg Oral Daily Osei-Bonsu, George, MD   0.4 mg at 11/02/16 1026  . traZODone (DESYREL) tablet 200 mg  200 mg Oral QHS Osei-Bonsu, Iona Beard, MD   200 mg at 11/01/16 2203     Discharge Medications:   START taking these medications   Details  levofloxacin (LEVAQUIN) 750 MG tablet Take 1 tablet (750 mg total) by mouth daily. Qty: 5 tablet, Refills: 0    predniSONE (DELTASONE) 20 MG tablet Take 2 tablets (40 mg total) by mouth daily with breakfast. Qty: 10 tablet, Refills: 0          CONTINUE these medications which have CHANGED   Details  HYDROcodone-acetaminophen (NORCO/VICODIN) 5-325 MG tablet Take 1 tablet by mouth every 6 (six) hours as needed. Qty: 5 tablet, Refills: 0          CONTINUE these medications which have NOT CHANGED   Details  albuterol (PROVENTIL HFA;VENTOLIN HFA) 108 (90 BASE) MCG/ACT inhaler Inhale 2 puffs into the lungs every 4 (four) hours as needed for wheezing or shortness of breath.    aspirin EC 81 MG tablet Take 81 mg by mouth daily.    baclofen (LIORESAL) 10 MG tablet Take 10 mg by mouth 2 (two) times daily.    buPROPion (WELLBUTRIN SR) 150 MG 12 hr tablet Take 150 mg by mouth 2 (two) times daily.    cyanocobalamin 500 MCG tablet Take 1,000 mcg by mouth daily.    fluticasone (FLONASE) 50 MCG/ACT nasal spray Place 1 spray into both nostrils daily as needed for allergies or rhinitis.    gabapentin (NEURONTIN) 400 MG capsule Take 400 mg by mouth 3 (three) times daily.    memantine (NAMENDA) 10 MG tablet Take 10 mg by mouth  2 (two) times daily.    omeprazole (PRILOSEC) 20 MG capsule Take 20 mg by mouth daily.    pramipexole (MIRAPEX) 0.125 MG tablet Take 0.125 mg by mouth at bedtime.    sertraline (ZOLOFT) 50 MG tablet Take 50 mg by mouth daily.    tamsulosin (FLOMAX) 0.4 MG CAPS capsule Take 0.4 mg by mouth daily as needed.     traZODone (DESYREL) 100 MG tablet Take 200 mg by mouth at bedtime.  STOP taking these medications     traMADol (ULTRAM) 50 MG tablet        Relevant Imaging Results:  Relevant Lab Results:   Additional Information    Anthony [name], Clydene Pugh, LCSW

## 2016-11-02 NOTE — Discharge Summary (Addendum)
Physician Discharge Summary  Deaaron Fulghum MRN: [id number] DOB/AGE: 07-30-1940 76 y.o.  PCP: Vidal Schwalbe, MD   Admit date: 10/31/2016 Discharge date: 11/02/2016  Discharge Diagnoses:    Principal Problem:   HCAP (healthcare-associated pneumonia) Active Problems:   Normocytic anemia   CKD (chronic kidney disease)   COPD (chronic obstructive pulmonary disease) (HCC)   S/p left hip fracture Streptococcal community-acquired pneumonia   Follow-up recommendations Follow-up with PCP in 3-5 days , including all  additional recommended appointments as below Follow-up CBC, CMP in 3-5 days      Current Discharge Medication List    START taking these medications   Details  levofloxacin (LEVAQUIN) 750 MG tablet Take 1 tablet (750 mg total) by mouth daily. Qty: 5 tablet, Refills: 0    polyethylene glycol (MIRALAX / GLYCOLAX) packet Take 17 g by mouth daily. Qty: 14 each, Refills: 0    predniSONE (DELTASONE) 20 MG tablet Take 2 tablets (40 mg total) by mouth daily with breakfast. Qty: 10 tablet, Refills: 0      CONTINUE these medications which have CHANGED   Details  HYDROcodone-acetaminophen (NORCO/VICODIN) 5-325 MG tablet Take 1 tablet by mouth every 6 (six) hours as needed. Qty: 5 tablet, Refills: 0      CONTINUE these medications which have NOT CHANGED   Details  albuterol (PROVENTIL HFA;VENTOLIN HFA) 108 (90 BASE) MCG/ACT inhaler Inhale 2 puffs into the lungs every 4 (four) hours as needed for wheezing or shortness of breath.    aspirin EC 81 MG tablet Take 81 mg by mouth daily.    baclofen (LIORESAL) 10 MG tablet Take 10 mg by mouth 2 (two) times daily.    buPROPion (WELLBUTRIN SR) 150 MG 12 hr tablet Take 150 mg by mouth 2 (two) times daily.    cyanocobalamin 500 MCG tablet Take 1,000 mcg by mouth daily.    fluticasone (FLONASE) 50 MCG/ACT nasal spray Place 1 spray into both nostrils daily as needed for allergies or rhinitis.    gabapentin (NEURONTIN) 400 MG  capsule Take 400 mg by mouth 3 (three) times daily.    memantine (NAMENDA) 10 MG tablet Take 10 mg by mouth 2 (two) times daily.    omeprazole (PRILOSEC) 20 MG capsule Take 20 mg by mouth daily.    pramipexole (MIRAPEX) 0.125 MG tablet Take 0.125 mg by mouth at bedtime.    sertraline (ZOLOFT) 50 MG tablet Take 50 mg by mouth daily.    tamsulosin (FLOMAX) 0.4 MG CAPS capsule Take 0.4 mg by mouth daily as needed.     traZODone (DESYREL) 100 MG tablet Take 200 mg by mouth at bedtime.      STOP taking these medications     traMADol (ULTRAM) 50 MG tablet           Discharge Condition: Stable   Discharge Instructions Get Medicines reviewed and adjusted: Please take all your medications with you for your next visit with your Primary MD  Please request your Primary MD to go over all hospital tests and procedure/radiological results at the follow up, please ask your Primary MD to get all Hospital records sent to his/her office.  If you experience worsening of your admission symptoms, develop shortness of breath, life threatening emergency, suicidal or homicidal thoughts you must seek medical attention immediately by calling 911 or calling your MD immediately if symptoms less severe.  You must read complete instructions/literature along with all the possible adverse reactions/side effects for all the Medicines you take and that have  been prescribed to you. Take any new Medicines after you have completely understood and accpet all the possible adverse reactions/side effects.   Do not drive when taking Pain medications.   Do not take more than prescribed Pain, Sleep and Anxiety Medications  Special Instructions: If you have smoked or chewed Tobacco in the last 2 yrs please stop smoking, stop any regular Alcohol and or any Recreational drug use.  Wear Seat belts while driving.  Please note  You were cared for by a hospitalist during your hospital stay. Once you are discharged,  your primary care physician will handle any further medical issues. Please note that NO REFILLS for any discharge medications will be authorized once you are discharged, as it is imperative that you return to your primary care physician (or establish a relationship with a primary care physician if you do not have one) for your aftercare needs so that they can reassess your need for medications and monitor your lab values.  Discharge Instructions    AMB referral to orthopedics    Complete by:  As directed    Please provide appt ASAP in [place] regarding left hip   Diet - low sodium heart healthy    Complete by:  As directed    Diet - low sodium heart healthy    Complete by:  As directed    Increase activity slowly    Complete by:  As directed    Increase activity slowly    Complete by:  As directed            Disposition:  [facility]   Consults: *  None   Significant Diagnostic Studies:  Dg Chest 2 View  Result Date: 10/31/2016 CLINICAL DATA:  Acute onset of fever.  Initial encounter. EXAM: CHEST  2 VIEW COMPARISON:  None. FINDINGS: The lungs are well-aerated. Vascular congestion is noted. Mild right basilar airspace opacity may reflect atelectasis or possibly mild pneumonia. There is no evidence of pleural effusion or pneumothorax. The heart is normal in size; the mediastinal contour is within normal limits. No acute osseous abnormalities are seen. IMPRESSION: Vascular congestion. Mild right basilar airspace opacity may reflect atelectasis or possibly mild pneumonia. Electronically Signed   By: Garald Balding M.D.   On: 10/31/2016 04:52   Dg Pelvis 1-2 Views  Result Date: 10/31/2016 CLINICAL DATA:  Acute onset of fever. Bilateral hip pain. Initial encounter. EXAM: PELVIS - 1-2 VIEW COMPARISON:  CT of the abdomen and pelvis from 12/23/2014, and left hip CT performed 09/17/2016 FINDINGS: Mildly displaced fracture fragments are again noted at the left greater femoral trochanter,  without definite healing. Hardware at the proximal right femur appears grossly intact, without evidence of loosening. Both femoral heads remain seated at their respective acetabula. Mild degenerative change is noted along the lower lumbar spine. The sacroiliac joints are unremarkable. The visualized bowel gas pattern is within normal limits. IMPRESSION: Mildly displaced fracture fragments again noted at the left greater femoral trochanter, without definite interval healing. No new fracture seen. Electronically Signed   By: Garald Balding M.D.   On: 10/31/2016 04:54        Filed Weights   10/31/16 0331 10/31/16 1142  Weight: 73.9 kg (162 lb 14.7 oz) 73.1 kg (161 lb 3.2 oz)     Microbiology: Recent Results (from the past 240 hour(s))  Blood Culture (routine x 2)     Status: None (Preliminary result)   Collection Time: 10/31/16  4:13 AM  Result Value Ref Range Status  Specimen Description BLOOD LEFT ARM  Final   Special Requests   Final    BOTTLES DRAWN AEROBIC AND ANAEROBIC Blood Culture adequate volume   Culture NO GROWTH 2 DAYS  Final   Report Status PENDING  Incomplete  Blood Culture (routine x 2)     Status: None (Preliminary result)   Collection Time: 10/31/16  4:13 AM  Result Value Ref Range Status   Specimen Description BLOOD RIGHT ARM  Final   Special Requests   Final    BOTTLES DRAWN AEROBIC AND ANAEROBIC Blood Culture adequate volume   Culture NO GROWTH 2 DAYS  Final   Report Status PENDING  Incomplete  Urine culture     Status: Abnormal   Collection Time: 10/31/16  5:30 AM  Result Value Ref Range Status   Specimen Description URINE, CATHETERIZED  Final   Special Requests NONE  Final   Culture (A)  Final    <10,000 COLONIES/mL INSIGNIFICANT GROWTH Performed at [facility], [place] [address], [place], [place] [address]    Report Status 11/01/2016 FINAL  Final  Culture, sputum-assessment     Status: None   Collection Time: 10/31/16  3:30 PM  Result Value Ref Range  Status   Specimen Description EXPECTORATED SPUTUM  Final   Special Requests NONE  Final   Sputum evaluation   Final    THIS SPECIMEN IS ACCEPTABLE FOR SPUTUM CULTURE Performed at [facility]    Report Status 10/31/2016 FINAL  Final  Culture, respiratory (NON-Expectorated)     Status: None (Preliminary result)   Collection Time: 10/31/16  3:30 PM  Result Value Ref Range Status   Specimen Description EXPECTORATED SPUTUM  Final   Special Requests NONE  Final   Gram Stain   Final    RARE WBC PRESENT,BOTH PMN AND MONONUCLEAR NO ORGANISMS SEEN    Culture   Final    CULTURE REINCUBATED FOR BETTER GROWTH Performed at [facility], [place] [address], [place], [place] [address]    Report Status PENDING  Incomplete  MRSA PCR Screening     Status: None   Collection Time: 10/31/16  3:53 PM  Result Value Ref Range Status   MRSA by PCR NEGATIVE NEGATIVE Final    Comment:        The GeneXpert MRSA Assay (FDA approved for NASAL specimens only), is one component of a comprehensive MRSA colonization surveillance program. It is not intended to diagnose MRSA infection nor to guide or monitor treatment for MRSA infections.        Blood Culture    Component Value Date/Time   SDES EXPECTORATED SPUTUM 10/31/2016 1530   SDES EXPECTORATED SPUTUM 10/31/2016 1530   SPECREQUEST NONE 10/31/2016 1530   SPECREQUEST NONE 10/31/2016 1530   CULT  10/31/2016 1530    CULTURE REINCUBATED FOR BETTER GROWTH Performed at [facility], [place] [address], [place], [place] [address]    REPTSTATUS 10/31/2016 FINAL 10/31/2016 1530   REPTSTATUS PENDING 10/31/2016 1530      Labs: Results for orders placed or performed during the hospital encounter of 10/31/16 (from the past 48 hour(s))  Comprehensive metabolic panel     Status: Abnormal   Collection Time: 11/01/16  7:14 AM  Result Value Ref Range   Sodium 143 135 - 145 mmol/L   Potassium 4.2 3.5 - 5.1 mmol/L   Chloride 112 (H) 101 - 111  mmol/L   CO2 26 22 - 32 mmol/L   Glucose, Bld 102 (  H) 65 - 99 mg/dL   BUN 20 6 - 20 mg/dL   Creatinine, Ser 1.29 (H) 0.61 - 1.24 mg/dL   Calcium 8.1 (L) 8.9 - 10.3 mg/dL   Total Protein 5.9 (L) 6.5 - 8.1 g/dL   Albumin 2.5 (L) 3.5 - 5.0 g/dL   AST 15 15 - 41 U/L   ALT 8 (L) 17 - 63 U/L   Alkaline Phosphatase 63 38 - 126 U/L   Total Bilirubin 0.3 0.3 - 1.2 mg/dL   GFR calc non Af Amer 51 (L) >60 mL/min   GFR calc Af Amer 60 (L) >60 mL/min    Comment: (NOTE) The eGFR has been calculated using the CKD EPI equation. This calculation has not been validated in all clinical situations. eGFR's persistently <60 mL/min signify possible Chronic Kidney Disease.    Anion gap 5 5 - 15  CBC     Status: Abnormal   Collection Time: 11/01/16  7:14 AM  Result Value Ref Range   WBC 6.8 4.0 - 10.5 K/uL   RBC 3.10 (L) 4.22 - 5.81 MIL/uL   Hemoglobin 9.4 (L) 13.0 - 17.0 g/dL   HCT 30.4 (L) 39.0 - 52.0 %   MCV 98.1 78.0 - 100.0 fL   MCH 30.3 26.0 - 34.0 pg   MCHC 30.9 30.0 - 36.0 g/dL   RDW 16.5 (H) 11.5 - 15.5 %   Platelets 153 150 - 400 K/uL  Basic metabolic panel     Status: Abnormal   Collection Time: 11/02/16  3:11 AM  Result Value Ref Range   Sodium 142 135 - 145 mmol/L   Potassium 4.6 3.5 - 5.1 mmol/L   Chloride 114 (H) 101 - 111 mmol/L   CO2 23 22 - 32 mmol/L   Glucose, Bld 117 (H) 65 - 99 mg/dL   BUN 23 (H) 6 - 20 mg/dL   Creatinine, Ser 1.27 (H) 0.61 - 1.24 mg/dL   Calcium 8.0 (L) 8.9 - 10.3 mg/dL   GFR calc non Af Amer 52 (L) >60 mL/min   GFR calc Af Amer >60 >60 mL/min    Comment: (NOTE) The eGFR has been calculated using the CKD EPI equation. This calculation has not been validated in all clinical situations. eGFR's persistently <60 mL/min signify possible Chronic Kidney Disease.    Anion gap 5 5 - 15  CBC with Differential/Platelet     Status: Abnormal   Collection Time: 11/02/16  3:11 AM  Result Value Ref Range   WBC 6.4 4.0 - 10.5 K/uL   RBC 3.04 (L) 4.22 - 5.81  MIL/uL   Hemoglobin 9.3 (L) 13.0 - 17.0 g/dL   HCT 29.8 (L) 39.0 - 52.0 %   MCV 98.0 78.0 - 100.0 fL   MCH 30.6 26.0 - 34.0 pg   MCHC 31.2 30.0 - 36.0 g/dL   RDW 16.5 (H) 11.5 - 15.5 %   Platelets 142 (L) 150 - 400 K/uL   Neutrophils Relative % 80 %   Neutro Abs 5.2 1.7 - 7.7 K/uL   Lymphocytes Relative 10 %   Lymphs Abs 0.6 (L) 0.7 - 4.0 K/uL   Monocytes Relative 9 %   Monocytes Absolute 0.6 0.1 - 1.0 K/uL   Eosinophils Relative 1 %   Eosinophils Absolute 0.0 0.0 - 0.7 K/uL   Basophils Relative 0 %   Basophils Absolute 0.0 0.0 - 0.1 K/uL       HPI :*  Anthony [name] is a 76 y.o. male with medical  history significant for but not limited to COPD and dementia presenting with fever and cough. No known complaints of abdominal pain or chest pain nausea vomiting ED Course: At the ED patient was hemodynamically stable with a temperature 100.64F. X-ray of the chest noted right basilar infiltrate concerning for pneumonia. Patient is penicillin allergic and was given IV aztreonam and vancomycin for healthcare acquired pneumonia is admitted for further management  HOSPITAL COURSE:   #1 HCAP/COPD:, Suspect aspiration given location of the pneumonia Patient does have multiple allergies, initially started on broad-spectrum antibiotics Urine antigen positive for streptococcal pneumoniae Initially treated with aztreonam, vancomycin, now on oral levofloxacin for 5 days Expectorants/mucolytic/bronchodilator nebulizations takes as needed Sputum culture no growth so far Blood culture no growth so far  #2 dehydration: Initial creatinine 1.39, improved after IV fluids Monitor renal function and electrolytes  #3 CK D stage II, GFR around 51, creatinine 1.27 prior to discharge Stable  #4 left hip fracture: Patient has not been able to work since fall in July, CT showed comminuted and partially avulsed greater trochanteric fracture of the femur, has not followed up with orthopedics since, repeat  x-ray shows displaced fracture fragments, no new fracture Orthopedics Dr. Arther Abbott has been consulted. He has been out of town and therefore the patient is being provided and ambulatory orthopedic referral, for orthopedic surgery in [facility]     Discharge Exam:  Blood pressure 112/65, pulse 77, temperature 97.8 F (36.6 C), temperature source Oral, resp. rate 16, height '6\' 3"'  (1.905 m), weight 73.1 kg (161 lb 3.2 oz), SpO2 94 %.  Cardiovascular: Regular rate and rhythm, no murmurs / rubs / gallops. 2+ pedal pulses. No carotid bruits.  Abdomen: no tenderness, no masses palpated. No hepatosplenomegaly. Bowel sounds positive.  Musculoskeletal: no clubbing / cyanosis. No joint deformity upper and lower extremities. Good ROM, no contractures. Normal muscle tone.  Skin: no rashes, lesions, ulcers. No induration Neurologic: Alert oriented 2, moves all extremities, no obvious focal deficit  Psychiatric:  Normal mood.      Contact information for follow-up providers    Vidal Schwalbe, MD. Call.   Specialty:  Family Medicine Why:  Hospital follow-up in 3-5 days Contact information: [address] [place] [address] [phone]            Contact information for after-discharge care    Destination    [facility] Follow up.   Specialty:  Assisted Living Facility Contact information: [address] [place] [phone]                  Signed: Hosie Poisson 11/02/2016, 5:22 PM        Time spent >1 hour

## 2016-11-02 NOTE — Care Management Important Message (Signed)
Important Message  Patient Details  Name: Anthony [name] MRN: [id number] Date of Birth: 1940-10-14   Medicare Important Message Given:  Yes    Sherald Barge, RN 11/02/2016, 1:09 PM

## 2016-11-02 NOTE — Evaluation (Signed)
Physical Therapy Evaluation Patient Details Name: Anthony [name] MRN: [id number] DOB: [birth year] Today's Date: 11/02/2016   History of Present Illness  Anthony [name] is a 76 y.o. male with medical history significant for but not limited to COPD and dementia presenting with fever and cough. No known complaints of abdominal pain or chest pain nausea vomiting    Clinical Impression  Pt admitted with above diagnosis. Pt currently with functional limitations due to BLE weakness, fatigue and poor standing balance.  Pt will benefit from skilled PT to increase independence and safety with mobility while in hospital and will benefit from recommendation below.    Follow Up Recommendations SNF;Supervision/Assistance - 24 hour    Equipment Recommendations       Recommendations for Other Services       Precautions / Restrictions Precautions Precautions: Fall Restrictions Weight Bearing Restrictions: No      Mobility  Bed Mobility Overal bed mobility: Needs Assistance Bed Mobility: Supine to Sit     Supine to sit: Mod assist        Transfers Overall transfer level: Needs assistance Equipment used: Rolling walker (2 wheeled) Transfers: Sit to/from Stand Sit to Stand: Min assist            Ambulation/Gait Ambulation/Gait assistance: Min assist   Assistive device: Rolling walker (2 wheeled) Gait Pattern/deviations: Decreased step length - right;Decreased step length - left;Decreased stride length   Gait velocity interpretation: Below normal speed for age/gender General Gait Details: slightly unsteady with frequent standing rest breaks  Stairs            Wheelchair Mobility    Modified Rankin (Stroke Patients Only)       Balance Overall balance assessment: Needs assistance Sitting-balance support: Feet supported Sitting balance-Leahy Scale: Fair     Standing balance support: Bilateral upper extremity supported Standing balance-Leahy Scale: Poor                               Pertinent Vitals/Pain Pain Assessment: No/denies pain    Home Living Family/patient expects to be discharged to:: Private residence Living Arrangements: Children Available Help at Discharge: Family Type of Home: House Home Access: Stairs to enter Entrance Stairs-Rails: Right Entrance Stairs-Number of Steps: 2 Home Layout: One level Home Equipment: Environmental consultant - 2 wheels;Wheelchair - manual      Prior Function Level of Independence: Independent with assistive device(s)               Hand Dominance        Extremity/Trunk Assessment   Upper Extremity Assessment Upper Extremity Assessment: Overall WFL for tasks assessed    Lower Extremity Assessment Lower Extremity Assessment: Generalized weakness    Cervical / Trunk Assessment Cervical / Trunk Assessment: Kyphotic  Communication   Communication: No difficulties  Cognition Arousal/Alertness: Awake/alert Behavior During Therapy: WFL for tasks assessed/performed Overall Cognitive Status: Within Functional Limits for tasks assessed                                        General Comments      Exercises     Assessment/Plan    PT Assessment Patient needs continued PT services  PT Problem List Decreased strength;Decreased activity tolerance;Decreased mobility;Decreased balance       PT Treatment Interventions Gait training;Therapeutic activities;Therapeutic exercise    PT Goals (Current goals can be  found in the Care Plan section)  Acute Rehab PT Goals Patient Stated Goal: Return home able to walk PT Goal Formulation: With patient Time For Goal Achievement: 11/12/16 Potential to Achieve Goals: Good    Frequency Min 3X/week   Barriers to discharge        Co-evaluation               AM-PAC PT "6 Clicks" Daily Activity  Outcome Measure Difficulty turning over in bed (including adjusting bedclothes, sheets and blankets)?: A Little Difficulty moving from  lying on back to sitting on the side of the bed? : A Little Difficulty sitting down on and standing up from a chair with arms (e.g., wheelchair, bedside commode, etc,.)?: A Little Help needed moving to and from a bed to chair (including a wheelchair)?: A Little Help needed walking in hospital room?: A Little Help needed climbing 3-5 steps with a railing? : A Little 6 Click Score: 18    End of Session Equipment Utilized During Treatment: Gait belt Activity Tolerance: Patient limited by fatigue Patient left: in bed;with call bell/phone within reach Nurse Communication: Mobility status PT Visit Diagnosis: Unsteadiness on feet (R26.81);Other abnormalities of gait and mobility (R26.89);Muscle weakness (generalized) (M62.81)    Time: 0352-4818 PT Time Calculation (min) (ACUTE ONLY): 39 min   Charges:   PT Evaluation $PT Eval Low Complexity: 1 Low PT Treatments $Gait Training: 8-22 mins   PT G Codes:        4:25 PM, 11/20/2016 Lonell Grandchild, MPT Physical Therapist with [facility] [phone] office [phone] mobile phone

## 2016-11-02 NOTE — Care Management Note (Signed)
Case Management Note  Patient Details  Name: Anthony [name] MRN: [id number] Date of Birth: Oct 16, 1940  Subjective/Objective:                  Admitted with HCAP. From [facility]. Active with [facility] for PT services.   Action/Plan: Will return to ALF at DC with resumption of HH PT services. [facility] aware of admission. Will need orders to resume services at DC. CM will follow. CSW following for return to facility.    Expected Discharge Date:       11/03/2016           Expected Discharge Plan:  Assisted Living / Rest Home (with [facility] services)  In-House Referral:  Clinical Social Work  Discharge planning Services  CM Consult  Post Acute Care Choice:  Home Health Choice offered to:  Patient  HH Arranged:  PT [place]:  [place]  Status of Service:  In process, will continue to follow  Sherald Barge, RN 11/02/2016, 9:40 AM

## 2016-11-02 NOTE — Care Management (Signed)
Pt returning to ALF today with resumption of [place] services. Vaughan Basta, [facility] rep, aware of DC today and will obtain resumption order from chart. Pt/ALF aware HH has 48 hrs to make resumption visit.

## 2016-11-04 DIAGNOSIS — K219 Gastro-esophageal reflux disease without esophagitis: Secondary | ICD-10-CM | POA: Diagnosis not present

## 2016-11-04 DIAGNOSIS — F0390 Unspecified dementia without behavioral disturbance: Secondary | ICD-10-CM | POA: Diagnosis not present

## 2016-11-04 DIAGNOSIS — Z9181 History of falling: Secondary | ICD-10-CM | POA: Diagnosis not present

## 2016-11-04 DIAGNOSIS — J449 Chronic obstructive pulmonary disease, unspecified: Secondary | ICD-10-CM | POA: Diagnosis not present

## 2016-11-04 DIAGNOSIS — G2581 Restless legs syndrome: Secondary | ICD-10-CM | POA: Diagnosis not present

## 2016-11-04 DIAGNOSIS — S72144D Nondisplaced intertrochanteric fracture of right femur, subsequent encounter for closed fracture with routine healing: Secondary | ICD-10-CM | POA: Diagnosis not present

## 2016-11-04 LAB — CULTURE, RESPIRATORY W GRAM STAIN

## 2016-11-04 LAB — CULTURE, RESPIRATORY

## 2016-11-05 DIAGNOSIS — J449 Chronic obstructive pulmonary disease, unspecified: Secondary | ICD-10-CM | POA: Diagnosis not present

## 2016-11-05 DIAGNOSIS — S72144D Nondisplaced intertrochanteric fracture of right femur, subsequent encounter for closed fracture with routine healing: Secondary | ICD-10-CM | POA: Diagnosis not present

## 2016-11-05 DIAGNOSIS — G2581 Restless legs syndrome: Secondary | ICD-10-CM | POA: Diagnosis not present

## 2016-11-05 DIAGNOSIS — K219 Gastro-esophageal reflux disease without esophagitis: Secondary | ICD-10-CM | POA: Diagnosis not present

## 2016-11-05 DIAGNOSIS — Z9181 History of falling: Secondary | ICD-10-CM | POA: Diagnosis not present

## 2016-11-05 DIAGNOSIS — F0390 Unspecified dementia without behavioral disturbance: Secondary | ICD-10-CM | POA: Diagnosis not present

## 2016-11-05 LAB — CULTURE, BLOOD (ROUTINE X 2)
CULTURE: NO GROWTH
Culture: NO GROWTH
Special Requests: ADEQUATE
Special Requests: ADEQUATE

## 2016-11-08 DIAGNOSIS — Z9181 History of falling: Secondary | ICD-10-CM | POA: Diagnosis not present

## 2016-11-08 DIAGNOSIS — S72144D Nondisplaced intertrochanteric fracture of right femur, subsequent encounter for closed fracture with routine healing: Secondary | ICD-10-CM | POA: Diagnosis not present

## 2016-11-08 DIAGNOSIS — G2581 Restless legs syndrome: Secondary | ICD-10-CM | POA: Diagnosis not present

## 2016-11-08 DIAGNOSIS — F0390 Unspecified dementia without behavioral disturbance: Secondary | ICD-10-CM | POA: Diagnosis not present

## 2016-11-08 DIAGNOSIS — J449 Chronic obstructive pulmonary disease, unspecified: Secondary | ICD-10-CM | POA: Diagnosis not present

## 2016-11-08 DIAGNOSIS — K219 Gastro-esophageal reflux disease without esophagitis: Secondary | ICD-10-CM | POA: Diagnosis not present

## 2016-11-10 DIAGNOSIS — K219 Gastro-esophageal reflux disease without esophagitis: Secondary | ICD-10-CM | POA: Diagnosis not present

## 2016-11-10 DIAGNOSIS — G2581 Restless legs syndrome: Secondary | ICD-10-CM | POA: Diagnosis not present

## 2016-11-10 DIAGNOSIS — S72144D Nondisplaced intertrochanteric fracture of right femur, subsequent encounter for closed fracture with routine healing: Secondary | ICD-10-CM | POA: Diagnosis not present

## 2016-11-10 DIAGNOSIS — J449 Chronic obstructive pulmonary disease, unspecified: Secondary | ICD-10-CM | POA: Diagnosis not present

## 2016-11-10 DIAGNOSIS — F0390 Unspecified dementia without behavioral disturbance: Secondary | ICD-10-CM | POA: Diagnosis not present

## 2016-11-10 DIAGNOSIS — Z9181 History of falling: Secondary | ICD-10-CM | POA: Diagnosis not present

## 2016-11-11 DIAGNOSIS — F0390 Unspecified dementia without behavioral disturbance: Secondary | ICD-10-CM | POA: Diagnosis not present

## 2016-11-11 DIAGNOSIS — G2581 Restless legs syndrome: Secondary | ICD-10-CM | POA: Diagnosis not present

## 2016-11-11 DIAGNOSIS — S72144D Nondisplaced intertrochanteric fracture of right femur, subsequent encounter for closed fracture with routine healing: Secondary | ICD-10-CM | POA: Diagnosis not present

## 2016-11-11 DIAGNOSIS — K219 Gastro-esophageal reflux disease without esophagitis: Secondary | ICD-10-CM | POA: Diagnosis not present

## 2016-11-11 DIAGNOSIS — J449 Chronic obstructive pulmonary disease, unspecified: Secondary | ICD-10-CM | POA: Diagnosis not present

## 2016-11-11 DIAGNOSIS — Z9181 History of falling: Secondary | ICD-10-CM | POA: Diagnosis not present

## 2016-11-12 DIAGNOSIS — J449 Chronic obstructive pulmonary disease, unspecified: Secondary | ICD-10-CM | POA: Diagnosis not present

## 2016-11-12 DIAGNOSIS — J188 Other pneumonia, unspecified organism: Secondary | ICD-10-CM | POA: Diagnosis not present

## 2016-11-12 DIAGNOSIS — N182 Chronic kidney disease, stage 2 (mild): Secondary | ICD-10-CM | POA: Diagnosis not present

## 2016-11-12 DIAGNOSIS — K219 Gastro-esophageal reflux disease without esophagitis: Secondary | ICD-10-CM | POA: Diagnosis not present

## 2016-11-16 DIAGNOSIS — F0390 Unspecified dementia without behavioral disturbance: Secondary | ICD-10-CM | POA: Diagnosis not present

## 2016-11-16 DIAGNOSIS — J449 Chronic obstructive pulmonary disease, unspecified: Secondary | ICD-10-CM | POA: Diagnosis not present

## 2016-11-16 DIAGNOSIS — S72144D Nondisplaced intertrochanteric fracture of right femur, subsequent encounter for closed fracture with routine healing: Secondary | ICD-10-CM | POA: Diagnosis not present

## 2016-11-16 DIAGNOSIS — K219 Gastro-esophageal reflux disease without esophagitis: Secondary | ICD-10-CM | POA: Diagnosis not present

## 2016-11-16 DIAGNOSIS — G2581 Restless legs syndrome: Secondary | ICD-10-CM | POA: Diagnosis not present

## 2016-11-16 DIAGNOSIS — Z9181 History of falling: Secondary | ICD-10-CM | POA: Diagnosis not present

## 2016-11-18 DIAGNOSIS — S72144D Nondisplaced intertrochanteric fracture of right femur, subsequent encounter for closed fracture with routine healing: Secondary | ICD-10-CM | POA: Diagnosis not present

## 2016-11-18 DIAGNOSIS — G2581 Restless legs syndrome: Secondary | ICD-10-CM | POA: Diagnosis not present

## 2016-11-18 DIAGNOSIS — Z9181 History of falling: Secondary | ICD-10-CM | POA: Diagnosis not present

## 2016-11-18 DIAGNOSIS — K219 Gastro-esophageal reflux disease without esophagitis: Secondary | ICD-10-CM | POA: Diagnosis not present

## 2016-11-18 DIAGNOSIS — J449 Chronic obstructive pulmonary disease, unspecified: Secondary | ICD-10-CM | POA: Diagnosis not present

## 2016-11-18 DIAGNOSIS — F0390 Unspecified dementia without behavioral disturbance: Secondary | ICD-10-CM | POA: Diagnosis not present

## 2016-11-19 DIAGNOSIS — F0390 Unspecified dementia without behavioral disturbance: Secondary | ICD-10-CM | POA: Diagnosis not present

## 2016-11-19 DIAGNOSIS — J449 Chronic obstructive pulmonary disease, unspecified: Secondary | ICD-10-CM | POA: Diagnosis not present

## 2016-11-19 DIAGNOSIS — K219 Gastro-esophageal reflux disease without esophagitis: Secondary | ICD-10-CM | POA: Diagnosis not present

## 2016-11-19 DIAGNOSIS — G2581 Restless legs syndrome: Secondary | ICD-10-CM | POA: Diagnosis not present

## 2016-11-19 DIAGNOSIS — Z9181 History of falling: Secondary | ICD-10-CM | POA: Diagnosis not present

## 2016-11-19 DIAGNOSIS — S72144D Nondisplaced intertrochanteric fracture of right femur, subsequent encounter for closed fracture with routine healing: Secondary | ICD-10-CM | POA: Diagnosis not present

## 2016-11-22 DIAGNOSIS — K219 Gastro-esophageal reflux disease without esophagitis: Secondary | ICD-10-CM | POA: Diagnosis not present

## 2016-11-22 DIAGNOSIS — F0390 Unspecified dementia without behavioral disturbance: Secondary | ICD-10-CM | POA: Diagnosis not present

## 2016-11-22 DIAGNOSIS — J449 Chronic obstructive pulmonary disease, unspecified: Secondary | ICD-10-CM | POA: Diagnosis not present

## 2016-11-22 DIAGNOSIS — G2581 Restless legs syndrome: Secondary | ICD-10-CM | POA: Diagnosis not present

## 2016-11-22 DIAGNOSIS — S72144D Nondisplaced intertrochanteric fracture of right femur, subsequent encounter for closed fracture with routine healing: Secondary | ICD-10-CM | POA: Diagnosis not present

## 2016-11-22 DIAGNOSIS — Z9181 History of falling: Secondary | ICD-10-CM | POA: Diagnosis not present

## 2016-11-23 DIAGNOSIS — Z9181 History of falling: Secondary | ICD-10-CM | POA: Diagnosis not present

## 2016-11-23 DIAGNOSIS — F0390 Unspecified dementia without behavioral disturbance: Secondary | ICD-10-CM | POA: Diagnosis not present

## 2016-11-23 DIAGNOSIS — G2581 Restless legs syndrome: Secondary | ICD-10-CM | POA: Diagnosis not present

## 2016-11-23 DIAGNOSIS — K219 Gastro-esophageal reflux disease without esophagitis: Secondary | ICD-10-CM | POA: Diagnosis not present

## 2016-11-23 DIAGNOSIS — S72144D Nondisplaced intertrochanteric fracture of right femur, subsequent encounter for closed fracture with routine healing: Secondary | ICD-10-CM | POA: Diagnosis not present

## 2016-11-23 DIAGNOSIS — J449 Chronic obstructive pulmonary disease, unspecified: Secondary | ICD-10-CM | POA: Diagnosis not present

## 2016-11-26 DIAGNOSIS — Z9181 History of falling: Secondary | ICD-10-CM | POA: Diagnosis not present

## 2016-11-26 DIAGNOSIS — K219 Gastro-esophageal reflux disease without esophagitis: Secondary | ICD-10-CM | POA: Diagnosis not present

## 2016-11-26 DIAGNOSIS — F0390 Unspecified dementia without behavioral disturbance: Secondary | ICD-10-CM | POA: Diagnosis not present

## 2016-11-26 DIAGNOSIS — J449 Chronic obstructive pulmonary disease, unspecified: Secondary | ICD-10-CM | POA: Diagnosis not present

## 2016-11-26 DIAGNOSIS — G2581 Restless legs syndrome: Secondary | ICD-10-CM | POA: Diagnosis not present

## 2016-11-26 DIAGNOSIS — S72144D Nondisplaced intertrochanteric fracture of right femur, subsequent encounter for closed fracture with routine healing: Secondary | ICD-10-CM | POA: Diagnosis not present

## 2016-11-30 DIAGNOSIS — F0390 Unspecified dementia without behavioral disturbance: Secondary | ICD-10-CM | POA: Diagnosis not present

## 2016-11-30 DIAGNOSIS — S72144D Nondisplaced intertrochanteric fracture of right femur, subsequent encounter for closed fracture with routine healing: Secondary | ICD-10-CM | POA: Diagnosis not present

## 2016-11-30 DIAGNOSIS — Z9181 History of falling: Secondary | ICD-10-CM | POA: Diagnosis not present

## 2016-11-30 DIAGNOSIS — G2581 Restless legs syndrome: Secondary | ICD-10-CM | POA: Diagnosis not present

## 2016-11-30 DIAGNOSIS — J449 Chronic obstructive pulmonary disease, unspecified: Secondary | ICD-10-CM | POA: Diagnosis not present

## 2016-11-30 DIAGNOSIS — K219 Gastro-esophageal reflux disease without esophagitis: Secondary | ICD-10-CM | POA: Diagnosis not present

## 2016-12-02 DIAGNOSIS — J449 Chronic obstructive pulmonary disease, unspecified: Secondary | ICD-10-CM | POA: Diagnosis not present

## 2016-12-02 DIAGNOSIS — K219 Gastro-esophageal reflux disease without esophagitis: Secondary | ICD-10-CM | POA: Diagnosis not present

## 2016-12-02 DIAGNOSIS — E46 Unspecified protein-calorie malnutrition: Secondary | ICD-10-CM | POA: Diagnosis not present

## 2016-12-02 DIAGNOSIS — Z23 Encounter for immunization: Secondary | ICD-10-CM | POA: Diagnosis not present

## 2016-12-02 DIAGNOSIS — G2581 Restless legs syndrome: Secondary | ICD-10-CM | POA: Diagnosis not present

## 2016-12-06 DIAGNOSIS — S72144D Nondisplaced intertrochanteric fracture of right femur, subsequent encounter for closed fracture with routine healing: Secondary | ICD-10-CM | POA: Diagnosis not present

## 2016-12-06 DIAGNOSIS — M79675 Pain in left toe(s): Secondary | ICD-10-CM | POA: Diagnosis not present

## 2016-12-06 DIAGNOSIS — M79674 Pain in right toe(s): Secondary | ICD-10-CM | POA: Diagnosis not present

## 2016-12-06 DIAGNOSIS — J449 Chronic obstructive pulmonary disease, unspecified: Secondary | ICD-10-CM | POA: Diagnosis not present

## 2016-12-06 DIAGNOSIS — B351 Tinea unguium: Secondary | ICD-10-CM | POA: Diagnosis not present

## 2016-12-07 DIAGNOSIS — K219 Gastro-esophageal reflux disease without esophagitis: Secondary | ICD-10-CM | POA: Diagnosis not present

## 2016-12-07 DIAGNOSIS — G2581 Restless legs syndrome: Secondary | ICD-10-CM | POA: Diagnosis not present

## 2016-12-07 DIAGNOSIS — J449 Chronic obstructive pulmonary disease, unspecified: Secondary | ICD-10-CM | POA: Diagnosis not present

## 2016-12-07 DIAGNOSIS — S72144D Nondisplaced intertrochanteric fracture of right femur, subsequent encounter for closed fracture with routine healing: Secondary | ICD-10-CM | POA: Diagnosis not present

## 2016-12-07 DIAGNOSIS — Z9181 History of falling: Secondary | ICD-10-CM | POA: Diagnosis not present

## 2016-12-07 DIAGNOSIS — F0390 Unspecified dementia without behavioral disturbance: Secondary | ICD-10-CM | POA: Diagnosis not present

## 2016-12-14 DIAGNOSIS — Z111 Encounter for screening for respiratory tuberculosis: Secondary | ICD-10-CM | POA: Diagnosis not present

## 2016-12-16 DIAGNOSIS — K219 Gastro-esophageal reflux disease without esophagitis: Secondary | ICD-10-CM | POA: Diagnosis not present

## 2016-12-16 DIAGNOSIS — J449 Chronic obstructive pulmonary disease, unspecified: Secondary | ICD-10-CM | POA: Diagnosis not present

## 2016-12-16 DIAGNOSIS — Z9181 History of falling: Secondary | ICD-10-CM | POA: Diagnosis not present

## 2016-12-16 DIAGNOSIS — F0390 Unspecified dementia without behavioral disturbance: Secondary | ICD-10-CM | POA: Diagnosis not present

## 2016-12-16 DIAGNOSIS — G2581 Restless legs syndrome: Secondary | ICD-10-CM | POA: Diagnosis not present

## 2016-12-16 DIAGNOSIS — S72144D Nondisplaced intertrochanteric fracture of right femur, subsequent encounter for closed fracture with routine healing: Secondary | ICD-10-CM | POA: Diagnosis not present

## 2016-12-21 DIAGNOSIS — Z9181 History of falling: Secondary | ICD-10-CM | POA: Diagnosis not present

## 2016-12-21 DIAGNOSIS — S72144D Nondisplaced intertrochanteric fracture of right femur, subsequent encounter for closed fracture with routine healing: Secondary | ICD-10-CM | POA: Diagnosis not present

## 2016-12-21 DIAGNOSIS — K219 Gastro-esophageal reflux disease without esophagitis: Secondary | ICD-10-CM | POA: Diagnosis not present

## 2016-12-21 DIAGNOSIS — F0390 Unspecified dementia without behavioral disturbance: Secondary | ICD-10-CM | POA: Diagnosis not present

## 2016-12-21 DIAGNOSIS — G2581 Restless legs syndrome: Secondary | ICD-10-CM | POA: Diagnosis not present

## 2016-12-21 DIAGNOSIS — J449 Chronic obstructive pulmonary disease, unspecified: Secondary | ICD-10-CM | POA: Diagnosis not present

## 2016-12-24 ENCOUNTER — Encounter (HOSPITAL_COMMUNITY): Payer: Self-pay | Admitting: *Deleted

## 2016-12-24 ENCOUNTER — Emergency Department (HOSPITAL_COMMUNITY): Payer: Medicare Other

## 2016-12-24 ENCOUNTER — Emergency Department (HOSPITAL_COMMUNITY)
Admission: EM | Admit: 2016-12-24 | Discharge: 2016-12-24 | Disposition: A | Discharge status: Home / Self Care | Payer: Medicare Other | Attending: Emergency Medicine | Admitting: Emergency Medicine

## 2016-12-24 DIAGNOSIS — N189 Chronic kidney disease, unspecified: Secondary | ICD-10-CM | POA: Diagnosis not present

## 2016-12-24 DIAGNOSIS — R1084 Generalized abdominal pain: Secondary | ICD-10-CM | POA: Diagnosis not present

## 2016-12-24 DIAGNOSIS — S20301A Unspecified superficial injuries of right front wall of thorax, initial encounter: Secondary | ICD-10-CM | POA: Diagnosis present

## 2016-12-24 DIAGNOSIS — J189 Pneumonia, unspecified organism: Secondary | ICD-10-CM

## 2016-12-24 DIAGNOSIS — Y929 Unspecified place or not applicable: Secondary | ICD-10-CM | POA: Diagnosis not present

## 2016-12-24 DIAGNOSIS — S4991XA Unspecified injury of right shoulder and upper arm, initial encounter: Secondary | ICD-10-CM | POA: Diagnosis not present

## 2016-12-24 DIAGNOSIS — Y9389 Activity, other specified: Secondary | ICD-10-CM | POA: Diagnosis not present

## 2016-12-24 DIAGNOSIS — J181 Lobar pneumonia, unspecified organism: Secondary | ICD-10-CM | POA: Insufficient documentation

## 2016-12-24 DIAGNOSIS — Z79899 Other long term (current) drug therapy: Secondary | ICD-10-CM | POA: Insufficient documentation

## 2016-12-24 DIAGNOSIS — Z7982 Long term (current) use of aspirin: Secondary | ICD-10-CM | POA: Diagnosis not present

## 2016-12-24 DIAGNOSIS — F17210 Nicotine dependence, cigarettes, uncomplicated: Secondary | ICD-10-CM | POA: Diagnosis not present

## 2016-12-24 DIAGNOSIS — J45909 Unspecified asthma, uncomplicated: Secondary | ICD-10-CM | POA: Diagnosis not present

## 2016-12-24 DIAGNOSIS — R109 Unspecified abdominal pain: Secondary | ICD-10-CM | POA: Diagnosis not present

## 2016-12-24 DIAGNOSIS — Y999 Unspecified external cause status: Secondary | ICD-10-CM | POA: Insufficient documentation

## 2016-12-24 DIAGNOSIS — S20211A Contusion of right front wall of thorax, initial encounter: Secondary | ICD-10-CM | POA: Diagnosis not present

## 2016-12-24 DIAGNOSIS — R0781 Pleurodynia: Secondary | ICD-10-CM | POA: Diagnosis not present

## 2016-12-24 DIAGNOSIS — S299XXA Unspecified injury of thorax, initial encounter: Secondary | ICD-10-CM | POA: Diagnosis not present

## 2016-12-24 DIAGNOSIS — M899 Disorder of bone, unspecified: Secondary | ICD-10-CM

## 2016-12-24 DIAGNOSIS — X58XXXA Exposure to other specified factors, initial encounter: Secondary | ICD-10-CM | POA: Insufficient documentation

## 2016-12-24 DIAGNOSIS — J449 Chronic obstructive pulmonary disease, unspecified: Secondary | ICD-10-CM | POA: Insufficient documentation

## 2016-12-24 LAB — CBC WITH DIFFERENTIAL/PLATELET
BASOS ABS: 0 10*3/uL (ref 0.0–0.1)
Basophils Relative: 0 %
Eosinophils Absolute: 0.9 10*3/uL — ABNORMAL HIGH (ref 0.0–0.7)
Eosinophils Relative: 13 %
HEMATOCRIT: 40.8 % (ref 39.0–52.0)
HEMOGLOBIN: 12.6 g/dL — AB (ref 13.0–17.0)
LYMPHS PCT: 8 %
Lymphs Abs: 0.5 10*3/uL — ABNORMAL LOW (ref 0.7–4.0)
MCH: 29.9 pg (ref 26.0–34.0)
MCHC: 30.9 g/dL (ref 30.0–36.0)
MCV: 96.7 fL (ref 78.0–100.0)
MONO ABS: 0.6 10*3/uL (ref 0.1–1.0)
Monocytes Relative: 9 %
NEUTROS ABS: 4.9 10*3/uL (ref 1.7–7.7)
Neutrophils Relative %: 70 %
Platelets: 155 10*3/uL (ref 150–400)
RBC: 4.22 MIL/uL (ref 4.22–5.81)
RDW: 16.7 % — AB (ref 11.5–15.5)
WBC: 7 10*3/uL (ref 4.0–10.5)

## 2016-12-24 LAB — COMPREHENSIVE METABOLIC PANEL
ALK PHOS: 94 U/L (ref 38–126)
ALT: 9 U/L — AB (ref 17–63)
AST: 14 U/L — AB (ref 15–41)
Albumin: 3.4 g/dL — ABNORMAL LOW (ref 3.5–5.0)
Anion gap: 8 (ref 5–15)
BILIRUBIN TOTAL: 0.4 mg/dL (ref 0.3–1.2)
BUN: 29 mg/dL — AB (ref 6–20)
CALCIUM: 8.6 mg/dL — AB (ref 8.9–10.3)
CO2: 26 mmol/L (ref 22–32)
CREATININE: 1.67 mg/dL — AB (ref 0.61–1.24)
Chloride: 106 mmol/L (ref 101–111)
GFR calc Af Amer: 44 mL/min — ABNORMAL LOW (ref >60)
GFR, EST NON AFRICAN AMERICAN: 38 mL/min — AB (ref >60)
Glucose, Bld: 86 mg/dL (ref 65–99)
POTASSIUM: 4.1 mmol/L (ref 3.5–5.1)
Sodium: 140 mmol/L (ref 135–145)
TOTAL PROTEIN: 7.6 g/dL (ref 6.5–8.1)

## 2016-12-24 LAB — I-STAT CHEM 8, ED
BUN: 30 mg/dL — AB (ref 6–20)
CREATININE: 1.7 mg/dL — AB (ref 0.61–1.24)
Calcium, Ion: 1.16 mmol/L (ref 1.15–1.40)
Chloride: 106 mmol/L (ref 101–111)
Glucose, Bld: 84 mg/dL (ref 65–99)
HEMATOCRIT: 40 % (ref 39.0–52.0)
HEMOGLOBIN: 13.6 g/dL (ref 13.0–17.0)
POTASSIUM: 4.2 mmol/L (ref 3.5–5.1)
Sodium: 145 mmol/L (ref 135–145)
TCO2: 28 mmol/L (ref 22–32)

## 2016-12-24 LAB — I-STAT CG4 LACTIC ACID, ED: LACTIC ACID, VENOUS: 1 mmol/L (ref 0.5–1.9)

## 2016-12-24 MED ORDER — IOPAMIDOL (ISOVUE-300) INJECTION 61%
80.0000 mL | Freq: Once | INTRAVENOUS | Status: AC | PRN
  Administered 2016-12-24: 100 mL via INTRAVENOUS

## 2016-12-24 MED ORDER — SODIUM CHLORIDE 0.9 % IV BOLUS (SEPSIS)
1000.0000 mL | Freq: Once | INTRAVENOUS | Status: AC
Start: 2016-12-24 — End: 2016-12-24
  Administered 2016-12-24: 1000 mL via INTRAVENOUS

## 2016-12-24 MED ORDER — FENTANYL CITRATE (PF) 100 MCG/2ML IJ SOLN
25.0000 ug | Freq: Once | INTRAMUSCULAR | Status: AC
  Administered 2016-12-24: 25 ug via INTRAVENOUS
  Filled 2016-12-24: qty 2

## 2016-12-24 MED ORDER — AZITHROMYCIN 250 MG PO TABS: 250.0000 mg | ORAL_TABLET | Freq: Every day | ORAL | 0 refills | Status: DC

## 2016-12-24 MED ORDER — DEXTROSE 5 % IV SOLN
500.0000 mg | Freq: Once | INTRAVENOUS | Status: AC
  Administered 2016-12-24: 500 mg via INTRAVENOUS
  Filled 2016-12-24: qty 500

## 2016-12-24 MED ORDER — DEXTROSE 5 % IV SOLN
1.0000 g | Freq: Once | INTRAVENOUS | Status: AC
  Administered 2016-12-24: 1 g via INTRAVENOUS
  Filled 2016-12-24: qty 10

## 2016-12-24 NOTE — ED Triage Notes (Signed)
Pt c/o pain to right rib area; pt states he fell out of wheelchair while transferring from wheelchair to toilet; pt states it hurts to take a deep breath

## 2016-12-24 NOTE — ED Provider Notes (Signed)
[place] DEPT Provider Note   CSN: [id number] Arrival date & time: 12/24/16  0559     History   Chief Complaint Chief Complaint  Patient presents with  . Fall    HPI Anthony [name] is a 76 y.o. male.  Patient presents with pain to his right ribs after he fell out of his wheelchair last night. States the brakes were not on his wheelchair and he fell onto his right side onto a hard surface. Did not hit head or lose consciousness. Complains of right rib pain worse with deep breathing. Denies feeling short of breath. Does have a history of COPD. Is not more oxygen at home. No blood thinner use. Denies head, neck, back, chest or abdominal pain.   The history is provided by the patient.  Fall  Associated symptoms include chest pain. Pertinent negatives include no abdominal pain, no headaches and no shortness of breath.    Past Medical History:  Diagnosis Date  . Acute pyelonephritis 12/23/2014  . Asthma   . BPH (benign prostatic hyperplasia)   . Chronic back pain   . Chronic hip pain   . COPD (chronic obstructive pulmonary disease) ([place])   . Dementia   . Depression   . GERD (gastroesophageal reflux disease)   . Kidney stone   . Neuropathy   . Pulmonary nodules 12/25/2014  . Restless leg syndrome     Patient Active Problem List   Diagnosis Date Noted  . S/p left hip fracture   . HCAP (healthcare-associated pneumonia) 10/31/2016  . CKD (chronic kidney disease) 10/31/2016  . COPD (chronic obstructive pulmonary disease) ([place]) 10/31/2016  . Hyponatremia 12/25/2014  . Hypokalemia 12/25/2014  . Normocytic anemia 12/25/2014  . Pulmonary nodules 12/25/2014  . Tobacco abuse 12/25/2014  . ARF (acute renal failure) ([place]) 12/24/2014  . Acute pyelonephritis 12/23/2014    Past Surgical History:  Procedure Laterality Date  . ABDOMINAL SURGERY     heria  . EYE SURGERY         Home Medications    Prior to Admission medications   Medication Sig Start Date End Date  Taking? Authorizing Provider  albuterol (PROVENTIL HFA;VENTOLIN HFA) 108 (90 BASE) MCG/ACT inhaler Inhale 2 puffs into the lungs every 4 (four) hours as needed for wheezing or shortness of breath.    [provider]  aspirin EC 81 MG tablet Take 81 mg by mouth daily.    [provider]  baclofen (LIORESAL) 10 MG tablet Take 10 mg by mouth 2 (two) times daily.    [provider]  buPROPion (WELLBUTRIN SR) 150 MG 12 hr tablet Take 150 mg by mouth 2 (two) times daily.    [provider]  cyanocobalamin 500 MCG tablet Take 1,000 mcg by mouth daily.    [provider]  fluticasone (FLONASE) 50 MCG/ACT nasal spray Place 1 spray into both nostrils daily as needed for allergies or rhinitis.    [provider]  gabapentin (NEURONTIN) 400 MG capsule Take 400 mg by mouth 3 (three) times daily.    [provider]  memantine (NAMENDA) 10 MG tablet Take 10 mg by mouth 2 (two) times daily.    [provider]  omeprazole (PRILOSEC) 20 MG capsule Take 20 mg by mouth daily.    [provider]  polyethylene glycol (MIRALAX / GLYCOLAX) packet Take 17 g by mouth daily. 11/02/16   Reyne Dumas, MD  pramipexole (MIRAPEX) 0.125 MG tablet Take 0.125 mg by mouth at bedtime.  [provider]  sertraline (ZOLOFT) 50 MG tablet Take 50 mg by mouth daily.    [provider]  tamsulosin (FLOMAX) 0.4 MG CAPS capsule Take 0.4 mg by mouth daily as needed.     [provider]  traZODone (DESYREL) 100 MG tablet Take 200 mg by mouth at bedtime.    [provider]    Family History History reviewed. No pertinent family history.  Social History Social History  Substance Use Topics  . Smoking status: Current Every Day Smoker    Packs/day: 0.50    Types: Cigarettes  . Smokeless tobacco: Never Used  . Alcohol use No     Allergies   Bee venom; Penicillins; and Strawberry extract   Review of Systems Review  of Systems  Constitutional: Negative for activity change, appetite change and fever.  HENT: Negative for congestion and rhinorrhea.   Eyes: Negative for visual disturbance.  Respiratory: Negative for chest tightness and shortness of breath.   Cardiovascular: Positive for chest pain.  Gastrointestinal: Negative for abdominal pain, nausea and vomiting.  Genitourinary: Negative for dysuria and hematuria.  Musculoskeletal: Positive for arthralgias and myalgias.  Skin: Negative for rash.  Neurological: Negative for dizziness, weakness, light-headedness and headaches.   all other systems are negative except as noted in the HPI and PMH.    Physical Exam Updated Vital Signs BP 123/77 (BP Location: Left Arm)   Pulse (!) 59   Temp 98 F (36.7 C) (Oral)   Resp 16   Ht 6\' 3"  (1.905 m)   Wt 74.8 kg (165 lb)   SpO2 99%   BMI 20.62 kg/m   Physical Exam  Constitutional: He is oriented to person, place, and time. He appears well-developed and well-nourished. No distress.  HENT:  Head: Normocephalic and atraumatic.  Mouth/Throat: Oropharynx is clear and moist. No oropharyngeal exudate.  Eyes: Pupils are equal, round, and reactive to light. Conjunctivae and EOM are normal.  Neck: Normal range of motion. Neck supple.  No C spine tenderness  Cardiovascular: Normal rate, regular rhythm, normal heart sounds and intact distal pulses.   No murmur heard. Pulmonary/Chest: Effort normal and breath sounds normal. No respiratory distress. He exhibits tenderness.  TTP R lateral ribs.  Erythema to R chest wall. No crepitus.  Abdominal: Soft. There is no tenderness. There is no rebound and no guarding.  Musculoskeletal: Normal range of motion. He exhibits no edema or tenderness.  No T or L spine tenderness  Neurological: He is alert and oriented to person, place, and time. No cranial nerve deficit. He exhibits normal muscle tone. Coordination normal.   5/5 strength throughout. CN 2-12 intact.Equal grip  strength.   Skin: Skin is warm.  Psychiatric: He has a normal mood and affect. His behavior is normal.  Nursing note and vitals reviewed.    ED Treatments / Results  Labs (all labs ordered are listed, but only abnormal results are displayed) Labs Reviewed  CBC WITH DIFFERENTIAL/PLATELET - Abnormal; Notable for the following:       Result Value   Hemoglobin 12.6 (*)    RDW 16.7 (*)    Lymphs Abs 0.5 (*)    Eosinophils Absolute 0.9 (*)    All other components within normal limits  COMPREHENSIVE METABOLIC PANEL - Abnormal; Notable for the following:    BUN 29 (*)    Creatinine, Ser 1.67 (*)    Calcium 8.6 (*)    Albumin 3.4 (*)    AST 14 (*)  ALT 9 (*)    GFR calc non Af Amer 38 (*)    GFR calc Af Amer 44 (*)    All other components within normal limits  I-STAT CHEM 8, ED - Abnormal; Notable for the following:    BUN 30 (*)    Creatinine, Ser 1.70 (*)    All other components within normal limits  CULTURE, BLOOD (ROUTINE X 2)  CULTURE, BLOOD (ROUTINE X 2)  I-STAT CG4 LACTIC ACID, ED    EKG  EKG Interpretation  Date/Time:  Friday December 24 2016 06:50:36 EDT Ventricular Rate:  69 PR Interval:    QRS Duration: 118 QT Interval:  433 QTC Calculation: 464 R Axis:   -34 Text Interpretation:  Sinus rhythm Nonspecific intraventricular conduction delay Low voltage, precordial leads Probable anteroseptal infarct, old Nonspecific T abnormalities, lateral leads No significant change was found Confirmed by Ezequiel Essex [phone]) on 12/24/2016 6:53:38 AM       Radiology Dg Ribs Unilateral W/chest Right  Result Date: 12/24/2016 CLINICAL DATA:  Fall.  Right-sided pain. EXAM: RIGHT RIBS AND CHEST - 3+ VIEW COMPARISON:  10/31/2016 . FINDINGS: Mediastinum hilar structures are normal. Right base infiltrate consistent with pneumonia. Tiny right pleural effusion . No pneumothorax. No acute bony abnormality. Degenerative changes and scoliosis thoracic spine. Mixed lytic sclerotic  lesion noted in the proximal right humerus. This could be a malignant lesion including a metastasis. Whole-body bone scan suggested to further evaluate. IMPRESSION: 1. Right base infiltrate suggesting pneumonia. Tiny right pleural effusion. 2. No acute bony abnormality. Mixed lytic sclerotic lesion proximal right humerus. This is worrisome for a malignant lesion and could represent a metastatic lesion. Whole-body bone scan suggested to further evaluate . Electronically Signed   By: Marcello Moores  Register   On: 12/24/2016 07:01    Procedures Procedures (including critical care time)  Medications Ordered in ED Medications - No data to display   Initial Impression / Assessment and Plan / ED Course  I have reviewed the triage vital signs and the nursing notes.  Pertinent labs & imaging results that were available during my care of the patient were reviewed by me and considered in my medical decision making (see chart for details).     Patient with right rib pain after mechanical fall onto his right side. Did not hit head or lose consciousness. Patient is in no distress. No hypoxia.  X-rays negative for rib fracture or pneumothorax. There is a questionable right basilar infiltrate concerning for pneumonia. There is also a lytic lesion of the humerus.  Patient has no history of malignancy. We'll obtain labs and treat for pneumonia. We'll also obtain CT scan to further evaluate possibility of malignancy as well as rule out any occult pneumothorax or rib fracture. Dr. Sabra Heck to assume care at shift change.  Final Clinical Impressions(s) / ED Diagnoses   Final diagnoses:  Contusion of rib on right side, initial encounter  Lytic bone lesions on xray  Community acquired pneumonia of right lower lobe of lung ([place])    New Prescriptions New Prescriptions   No medications on file     Ezequiel Essex, MD 12/24/16 2230

## 2016-12-24 NOTE — ED Provider Notes (Signed)
Patient reexamined, vital signs appear stable, he is not hypoxic to any degree of concern, he has received antibiotics for possible pneumonia. CT scan confirms this could be a pneumonia, no rib fractures, no lung contusion, no intra-abdominal trauma. He does have a sacral insufficiency fracture which is unlikely related to the fall but no other new weightbearing type fractures that would require orthopedic consultation.  This was discussed with the radiologist, this was also discussed with the patient, he expressed his understanding to the findings and the indications for follow-up. He appears comfortable going home, he is no respiratory distress, has no source of cancer as found on CT scans.     Noemi Chapel, MD 12/24/16 [phone]

## 2016-12-24 NOTE — ED Notes (Signed)
Report called to Donna.

## 2016-12-24 NOTE — Discharge Instructions (Signed)
Your x-rays today show that you have an abnormal right arm x-ray. It appears that there could be a spot of cancer in your arm. Your CAT scan of the chest abdomen and pelvis revealed no signs of cancer, there is some possible pneumonia on your right lung for which we will give you an antibiotic. Please finish the course of Zithromax daily for the next 5 days  He will need to have your family Dr. follow-up on your x-ray reports and refer you to a cancer specialist for further evaluation.  ER for worsening symptoms.  Tylenol or motrin for pain  Drink plenty of fluids

## 2016-12-29 DIAGNOSIS — M4317 Spondylolisthesis, lumbosacral region: Secondary | ICD-10-CM | POA: Diagnosis not present

## 2016-12-29 DIAGNOSIS — M5136 Other intervertebral disc degeneration, lumbar region: Secondary | ICD-10-CM | POA: Diagnosis not present

## 2016-12-29 DIAGNOSIS — M4185 Other forms of scoliosis, thoracolumbar region: Secondary | ICD-10-CM | POA: Diagnosis not present

## 2016-12-29 DIAGNOSIS — S72144D Nondisplaced intertrochanteric fracture of right femur, subsequent encounter for closed fracture with routine healing: Secondary | ICD-10-CM | POA: Diagnosis not present

## 2016-12-29 DIAGNOSIS — M545 Low back pain: Secondary | ICD-10-CM | POA: Diagnosis not present

## 2016-12-29 DIAGNOSIS — M47816 Spondylosis without myelopathy or radiculopathy, lumbar region: Secondary | ICD-10-CM | POA: Diagnosis not present

## 2016-12-29 DIAGNOSIS — S72114D Nondisplaced fracture of greater trochanter of right femur, subsequent encounter for closed fracture with routine healing: Secondary | ICD-10-CM | POA: Diagnosis not present

## 2016-12-29 DIAGNOSIS — S72112D Displaced fracture of greater trochanter of left femur, subsequent encounter for closed fracture with routine healing: Secondary | ICD-10-CM | POA: Diagnosis not present

## 2016-12-29 DIAGNOSIS — M4696 Unspecified inflammatory spondylopathy, lumbar region: Secondary | ICD-10-CM | POA: Diagnosis not present

## 2016-12-29 LAB — CULTURE, BLOOD (ROUTINE X 2)
CULTURE: NO GROWTH
Culture: NO GROWTH
SPECIAL REQUESTS: ADEQUATE
Special Requests: ADEQUATE

## 2017-01-03 DIAGNOSIS — G2581 Restless legs syndrome: Secondary | ICD-10-CM | POA: Diagnosis not present

## 2017-01-03 DIAGNOSIS — S72112D Displaced fracture of greater trochanter of left femur, subsequent encounter for closed fracture with routine healing: Secondary | ICD-10-CM | POA: Diagnosis not present

## 2017-01-03 DIAGNOSIS — Z9181 History of falling: Secondary | ICD-10-CM | POA: Diagnosis not present

## 2017-01-03 DIAGNOSIS — F17210 Nicotine dependence, cigarettes, uncomplicated: Secondary | ICD-10-CM | POA: Diagnosis not present

## 2017-01-03 DIAGNOSIS — M47816 Spondylosis without myelopathy or radiculopathy, lumbar region: Secondary | ICD-10-CM | POA: Diagnosis not present

## 2017-01-03 DIAGNOSIS — W19XXXD Unspecified fall, subsequent encounter: Secondary | ICD-10-CM | POA: Diagnosis not present

## 2017-01-03 DIAGNOSIS — Z7982 Long term (current) use of aspirin: Secondary | ICD-10-CM | POA: Diagnosis not present

## 2017-01-03 DIAGNOSIS — N189 Chronic kidney disease, unspecified: Secondary | ICD-10-CM | POA: Diagnosis not present

## 2017-01-03 DIAGNOSIS — F0390 Unspecified dementia without behavioral disturbance: Secondary | ICD-10-CM | POA: Diagnosis not present

## 2017-01-03 DIAGNOSIS — Z7951 Long term (current) use of inhaled steroids: Secondary | ICD-10-CM | POA: Diagnosis not present

## 2017-01-03 DIAGNOSIS — G629 Polyneuropathy, unspecified: Secondary | ICD-10-CM | POA: Diagnosis not present

## 2017-01-03 DIAGNOSIS — S20211D Contusion of right front wall of thorax, subsequent encounter: Secondary | ICD-10-CM | POA: Diagnosis not present

## 2017-01-03 DIAGNOSIS — F329 Major depressive disorder, single episode, unspecified: Secondary | ICD-10-CM | POA: Diagnosis not present

## 2017-01-03 DIAGNOSIS — S72144D Nondisplaced intertrochanteric fracture of right femur, subsequent encounter for closed fracture with routine healing: Secondary | ICD-10-CM | POA: Diagnosis not present

## 2017-01-03 DIAGNOSIS — J449 Chronic obstructive pulmonary disease, unspecified: Secondary | ICD-10-CM | POA: Diagnosis not present

## 2017-01-05 DIAGNOSIS — F0390 Unspecified dementia without behavioral disturbance: Secondary | ICD-10-CM | POA: Diagnosis not present

## 2017-01-05 DIAGNOSIS — J449 Chronic obstructive pulmonary disease, unspecified: Secondary | ICD-10-CM | POA: Diagnosis not present

## 2017-01-05 DIAGNOSIS — S72112D Displaced fracture of greater trochanter of left femur, subsequent encounter for closed fracture with routine healing: Secondary | ICD-10-CM | POA: Diagnosis not present

## 2017-01-05 DIAGNOSIS — M47816 Spondylosis without myelopathy or radiculopathy, lumbar region: Secondary | ICD-10-CM | POA: Diagnosis not present

## 2017-01-05 DIAGNOSIS — S72144D Nondisplaced intertrochanteric fracture of right femur, subsequent encounter for closed fracture with routine healing: Secondary | ICD-10-CM | POA: Diagnosis not present

## 2017-01-05 DIAGNOSIS — S20211D Contusion of right front wall of thorax, subsequent encounter: Secondary | ICD-10-CM | POA: Diagnosis not present

## 2017-01-06 DIAGNOSIS — M47816 Spondylosis without myelopathy or radiculopathy, lumbar region: Secondary | ICD-10-CM | POA: Diagnosis not present

## 2017-01-06 DIAGNOSIS — F0390 Unspecified dementia without behavioral disturbance: Secondary | ICD-10-CM | POA: Diagnosis not present

## 2017-01-06 DIAGNOSIS — S20211D Contusion of right front wall of thorax, subsequent encounter: Secondary | ICD-10-CM | POA: Diagnosis not present

## 2017-01-06 DIAGNOSIS — S72144D Nondisplaced intertrochanteric fracture of right femur, subsequent encounter for closed fracture with routine healing: Secondary | ICD-10-CM | POA: Diagnosis not present

## 2017-01-06 DIAGNOSIS — S72112D Displaced fracture of greater trochanter of left femur, subsequent encounter for closed fracture with routine healing: Secondary | ICD-10-CM | POA: Diagnosis not present

## 2017-01-06 DIAGNOSIS — J449 Chronic obstructive pulmonary disease, unspecified: Secondary | ICD-10-CM | POA: Diagnosis not present

## 2017-01-10 DIAGNOSIS — S72112D Displaced fracture of greater trochanter of left femur, subsequent encounter for closed fracture with routine healing: Secondary | ICD-10-CM | POA: Diagnosis not present

## 2017-01-10 DIAGNOSIS — J449 Chronic obstructive pulmonary disease, unspecified: Secondary | ICD-10-CM | POA: Diagnosis not present

## 2017-01-10 DIAGNOSIS — S72144D Nondisplaced intertrochanteric fracture of right femur, subsequent encounter for closed fracture with routine healing: Secondary | ICD-10-CM | POA: Diagnosis not present

## 2017-01-10 DIAGNOSIS — M47816 Spondylosis without myelopathy or radiculopathy, lumbar region: Secondary | ICD-10-CM | POA: Diagnosis not present

## 2017-01-10 DIAGNOSIS — F0390 Unspecified dementia without behavioral disturbance: Secondary | ICD-10-CM | POA: Diagnosis not present

## 2017-01-10 DIAGNOSIS — S20211D Contusion of right front wall of thorax, subsequent encounter: Secondary | ICD-10-CM | POA: Diagnosis not present

## 2017-01-11 ENCOUNTER — Other Ambulatory Visit (HOSPITAL_COMMUNITY): Payer: Self-pay | Admitting: Internal Medicine

## 2017-01-11 DIAGNOSIS — R2681 Unsteadiness on feet: Secondary | ICD-10-CM | POA: Diagnosis not present

## 2017-01-11 DIAGNOSIS — M1990 Unspecified osteoarthritis, unspecified site: Secondary | ICD-10-CM

## 2017-01-11 DIAGNOSIS — F0390 Unspecified dementia without behavioral disturbance: Secondary | ICD-10-CM | POA: Diagnosis not present

## 2017-01-11 DIAGNOSIS — S72112D Displaced fracture of greater trochanter of left femur, subsequent encounter for closed fracture with routine healing: Secondary | ICD-10-CM | POA: Diagnosis not present

## 2017-01-11 DIAGNOSIS — J449 Chronic obstructive pulmonary disease, unspecified: Secondary | ICD-10-CM | POA: Diagnosis not present

## 2017-01-11 DIAGNOSIS — Z1389 Encounter for screening for other disorder: Secondary | ICD-10-CM | POA: Diagnosis not present

## 2017-01-11 DIAGNOSIS — S20211D Contusion of right front wall of thorax, subsequent encounter: Secondary | ICD-10-CM | POA: Diagnosis not present

## 2017-01-11 DIAGNOSIS — M47816 Spondylosis without myelopathy or radiculopathy, lumbar region: Secondary | ICD-10-CM | POA: Diagnosis not present

## 2017-01-11 DIAGNOSIS — K219 Gastro-esophageal reflux disease without esophagitis: Secondary | ICD-10-CM | POA: Diagnosis not present

## 2017-01-11 DIAGNOSIS — S72144D Nondisplaced intertrochanteric fracture of right femur, subsequent encounter for closed fracture with routine healing: Secondary | ICD-10-CM | POA: Diagnosis not present

## 2017-01-12 DIAGNOSIS — L821 Other seborrheic keratosis: Secondary | ICD-10-CM | POA: Diagnosis not present

## 2017-01-12 DIAGNOSIS — Z85828 Personal history of other malignant neoplasm of skin: Secondary | ICD-10-CM | POA: Diagnosis not present

## 2017-01-12 DIAGNOSIS — D485 Neoplasm of uncertain behavior of skin: Secondary | ICD-10-CM | POA: Diagnosis not present

## 2017-01-12 DIAGNOSIS — D487 Neoplasm of uncertain behavior of other specified sites: Secondary | ICD-10-CM | POA: Diagnosis not present

## 2017-01-14 DIAGNOSIS — M47816 Spondylosis without myelopathy or radiculopathy, lumbar region: Secondary | ICD-10-CM | POA: Diagnosis not present

## 2017-01-14 DIAGNOSIS — S20211D Contusion of right front wall of thorax, subsequent encounter: Secondary | ICD-10-CM | POA: Diagnosis not present

## 2017-01-14 DIAGNOSIS — F0390 Unspecified dementia without behavioral disturbance: Secondary | ICD-10-CM | POA: Diagnosis not present

## 2017-01-14 DIAGNOSIS — S72144D Nondisplaced intertrochanteric fracture of right femur, subsequent encounter for closed fracture with routine healing: Secondary | ICD-10-CM | POA: Diagnosis not present

## 2017-01-14 DIAGNOSIS — S72112D Displaced fracture of greater trochanter of left femur, subsequent encounter for closed fracture with routine healing: Secondary | ICD-10-CM | POA: Diagnosis not present

## 2017-01-14 DIAGNOSIS — J449 Chronic obstructive pulmonary disease, unspecified: Secondary | ICD-10-CM | POA: Diagnosis not present

## 2017-01-17 ENCOUNTER — Other Ambulatory Visit (HOSPITAL_COMMUNITY): Payer: Medicare Other

## 2017-01-17 ENCOUNTER — Encounter (HOSPITAL_COMMUNITY): Payer: Self-pay

## 2017-01-18 DIAGNOSIS — S72144D Nondisplaced intertrochanteric fracture of right femur, subsequent encounter for closed fracture with routine healing: Secondary | ICD-10-CM | POA: Diagnosis not present

## 2017-01-18 DIAGNOSIS — J449 Chronic obstructive pulmonary disease, unspecified: Secondary | ICD-10-CM | POA: Diagnosis not present

## 2017-01-18 DIAGNOSIS — M47816 Spondylosis without myelopathy or radiculopathy, lumbar region: Secondary | ICD-10-CM | POA: Diagnosis not present

## 2017-01-18 DIAGNOSIS — S20211D Contusion of right front wall of thorax, subsequent encounter: Secondary | ICD-10-CM | POA: Diagnosis not present

## 2017-01-18 DIAGNOSIS — S72112D Displaced fracture of greater trochanter of left femur, subsequent encounter for closed fracture with routine healing: Secondary | ICD-10-CM | POA: Diagnosis not present

## 2017-01-18 DIAGNOSIS — F0390 Unspecified dementia without behavioral disturbance: Secondary | ICD-10-CM | POA: Diagnosis not present

## 2017-01-19 DIAGNOSIS — S72112D Displaced fracture of greater trochanter of left femur, subsequent encounter for closed fracture with routine healing: Secondary | ICD-10-CM | POA: Diagnosis not present

## 2017-01-19 DIAGNOSIS — J449 Chronic obstructive pulmonary disease, unspecified: Secondary | ICD-10-CM | POA: Diagnosis not present

## 2017-01-19 DIAGNOSIS — S72144D Nondisplaced intertrochanteric fracture of right femur, subsequent encounter for closed fracture with routine healing: Secondary | ICD-10-CM | POA: Diagnosis not present

## 2017-01-19 DIAGNOSIS — F0390 Unspecified dementia without behavioral disturbance: Secondary | ICD-10-CM | POA: Diagnosis not present

## 2017-01-19 DIAGNOSIS — S20211D Contusion of right front wall of thorax, subsequent encounter: Secondary | ICD-10-CM | POA: Diagnosis not present

## 2017-01-19 DIAGNOSIS — M47816 Spondylosis without myelopathy or radiculopathy, lumbar region: Secondary | ICD-10-CM | POA: Diagnosis not present

## 2017-01-21 DIAGNOSIS — F0390 Unspecified dementia without behavioral disturbance: Secondary | ICD-10-CM | POA: Diagnosis not present

## 2017-01-21 DIAGNOSIS — S20211D Contusion of right front wall of thorax, subsequent encounter: Secondary | ICD-10-CM | POA: Diagnosis not present

## 2017-01-21 DIAGNOSIS — J449 Chronic obstructive pulmonary disease, unspecified: Secondary | ICD-10-CM | POA: Diagnosis not present

## 2017-01-21 DIAGNOSIS — S72144D Nondisplaced intertrochanteric fracture of right femur, subsequent encounter for closed fracture with routine healing: Secondary | ICD-10-CM | POA: Diagnosis not present

## 2017-01-21 DIAGNOSIS — M47816 Spondylosis without myelopathy or radiculopathy, lumbar region: Secondary | ICD-10-CM | POA: Diagnosis not present

## 2017-01-21 DIAGNOSIS — S72112D Displaced fracture of greater trochanter of left femur, subsequent encounter for closed fracture with routine healing: Secondary | ICD-10-CM | POA: Diagnosis not present

## 2017-01-24 DIAGNOSIS — S72144D Nondisplaced intertrochanteric fracture of right femur, subsequent encounter for closed fracture with routine healing: Secondary | ICD-10-CM | POA: Diagnosis not present

## 2017-01-24 DIAGNOSIS — M47816 Spondylosis without myelopathy or radiculopathy, lumbar region: Secondary | ICD-10-CM | POA: Diagnosis not present

## 2017-01-24 DIAGNOSIS — S72112D Displaced fracture of greater trochanter of left femur, subsequent encounter for closed fracture with routine healing: Secondary | ICD-10-CM | POA: Diagnosis not present

## 2017-01-24 DIAGNOSIS — F0390 Unspecified dementia without behavioral disturbance: Secondary | ICD-10-CM | POA: Diagnosis not present

## 2017-01-24 DIAGNOSIS — J449 Chronic obstructive pulmonary disease, unspecified: Secondary | ICD-10-CM | POA: Diagnosis not present

## 2017-01-24 DIAGNOSIS — S20211D Contusion of right front wall of thorax, subsequent encounter: Secondary | ICD-10-CM | POA: Diagnosis not present

## 2017-01-27 DIAGNOSIS — F0390 Unspecified dementia without behavioral disturbance: Secondary | ICD-10-CM | POA: Diagnosis not present

## 2017-01-27 DIAGNOSIS — M47816 Spondylosis without myelopathy or radiculopathy, lumbar region: Secondary | ICD-10-CM | POA: Diagnosis not present

## 2017-01-27 DIAGNOSIS — S72144D Nondisplaced intertrochanteric fracture of right femur, subsequent encounter for closed fracture with routine healing: Secondary | ICD-10-CM | POA: Diagnosis not present

## 2017-01-27 DIAGNOSIS — S72112D Displaced fracture of greater trochanter of left femur, subsequent encounter for closed fracture with routine healing: Secondary | ICD-10-CM | POA: Diagnosis not present

## 2017-01-27 DIAGNOSIS — J449 Chronic obstructive pulmonary disease, unspecified: Secondary | ICD-10-CM | POA: Diagnosis not present

## 2017-01-27 DIAGNOSIS — S20211D Contusion of right front wall of thorax, subsequent encounter: Secondary | ICD-10-CM | POA: Diagnosis not present

## 2017-01-28 DIAGNOSIS — S20211D Contusion of right front wall of thorax, subsequent encounter: Secondary | ICD-10-CM | POA: Diagnosis not present

## 2017-01-28 DIAGNOSIS — S72112D Displaced fracture of greater trochanter of left femur, subsequent encounter for closed fracture with routine healing: Secondary | ICD-10-CM | POA: Diagnosis not present

## 2017-01-28 DIAGNOSIS — F0390 Unspecified dementia without behavioral disturbance: Secondary | ICD-10-CM | POA: Diagnosis not present

## 2017-01-28 DIAGNOSIS — J449 Chronic obstructive pulmonary disease, unspecified: Secondary | ICD-10-CM | POA: Diagnosis not present

## 2017-01-28 DIAGNOSIS — S72144D Nondisplaced intertrochanteric fracture of right femur, subsequent encounter for closed fracture with routine healing: Secondary | ICD-10-CM | POA: Diagnosis not present

## 2017-01-28 DIAGNOSIS — M47816 Spondylosis without myelopathy or radiculopathy, lumbar region: Secondary | ICD-10-CM | POA: Diagnosis not present

## 2017-01-30 ENCOUNTER — Emergency Department (HOSPITAL_COMMUNITY): Payer: Medicare Other

## 2017-01-30 ENCOUNTER — Emergency Department (HOSPITAL_COMMUNITY)
Admission: EM | Admit: 2017-01-30 | Discharge: 2017-01-30 | Disposition: A | Discharge status: Other Acute Inpatient Hospital | Payer: Medicare Other | Attending: Emergency Medicine | Admitting: Emergency Medicine

## 2017-01-30 ENCOUNTER — Encounter (HOSPITAL_COMMUNITY): Payer: Self-pay

## 2017-01-30 DIAGNOSIS — Y929 Unspecified place or not applicable: Secondary | ICD-10-CM | POA: Insufficient documentation

## 2017-01-30 DIAGNOSIS — S72114A Nondisplaced fracture of greater trochanter of right femur, initial encounter for closed fracture: Secondary | ICD-10-CM | POA: Diagnosis not present

## 2017-01-30 DIAGNOSIS — T148XXA Other injury of unspecified body region, initial encounter: Secondary | ICD-10-CM | POA: Diagnosis not present

## 2017-01-30 DIAGNOSIS — Y93E8 Activity, other personal hygiene: Secondary | ICD-10-CM | POA: Insufficient documentation

## 2017-01-30 DIAGNOSIS — M25551 Pain in right hip: Secondary | ICD-10-CM | POA: Diagnosis not present

## 2017-01-30 DIAGNOSIS — Y999 Unspecified external cause status: Secondary | ICD-10-CM | POA: Diagnosis not present

## 2017-01-30 DIAGNOSIS — Z79899 Other long term (current) drug therapy: Secondary | ICD-10-CM | POA: Insufficient documentation

## 2017-01-30 DIAGNOSIS — W010XXA Fall on same level from slipping, tripping and stumbling without subsequent striking against object, initial encounter: Secondary | ICD-10-CM | POA: Insufficient documentation

## 2017-01-30 DIAGNOSIS — J449 Chronic obstructive pulmonary disease, unspecified: Secondary | ICD-10-CM | POA: Diagnosis not present

## 2017-01-30 DIAGNOSIS — R279 Unspecified lack of coordination: Secondary | ICD-10-CM | POA: Diagnosis not present

## 2017-01-30 DIAGNOSIS — F17210 Nicotine dependence, cigarettes, uncomplicated: Secondary | ICD-10-CM | POA: Diagnosis not present

## 2017-01-30 DIAGNOSIS — J45909 Unspecified asthma, uncomplicated: Secondary | ICD-10-CM | POA: Insufficient documentation

## 2017-01-30 DIAGNOSIS — S79911A Unspecified injury of right hip, initial encounter: Secondary | ICD-10-CM | POA: Diagnosis not present

## 2017-01-30 DIAGNOSIS — Z7401 Bed confinement status: Secondary | ICD-10-CM | POA: Diagnosis not present

## 2017-01-30 NOTE — ED Notes (Signed)
Report given to Brittney at [facility].

## 2017-01-30 NOTE — ED Notes (Signed)
[facility] resident- fell in dining room today  Neg xrays

## 2017-01-30 NOTE — ED Provider Notes (Signed)
[facility] EMERGENCY DEPARTMENT Provider Note   CSN: [id number] Arrival date & time: 01/30/17  1718     History   Chief Complaint Chief Complaint  Patient presents with  . Fall    HPI Anthony [name] is a 76 y.o. male.  HPI Patient presents after fall.  States he was attempting to get his pants on and fell.  Now his pain in his right hip.  No other injury.  No arm neck chest abdomen or other extremity pain.  No loss conscious.  Not on anticoagulation.  Has not ambulated since the fall. Past Medical History:  Diagnosis Date  . Acute pyelonephritis 12/23/2014  . Asthma   . BPH (benign prostatic hyperplasia)   . Chronic back pain   . Chronic hip pain   . COPD (chronic obstructive pulmonary disease) ([place])   . Dementia   . Depression   . GERD (gastroesophageal reflux disease)   . Kidney stone   . Neuropathy   . Pulmonary nodules 12/25/2014  . Restless leg syndrome     Patient Active Problem List   Diagnosis Date Noted  . S/p left hip fracture   . HCAP (healthcare-associated pneumonia) 10/31/2016  . CKD (chronic kidney disease) 10/31/2016  . COPD (chronic obstructive pulmonary disease) ([place]) 10/31/2016  . Hyponatremia 12/25/2014  . Hypokalemia 12/25/2014  . Normocytic anemia 12/25/2014  . Pulmonary nodules 12/25/2014  . Tobacco abuse 12/25/2014  . ARF (acute renal failure) ([place]) 12/24/2014  . Acute pyelonephritis 12/23/2014    Past Surgical History:  Procedure Laterality Date  . ABDOMINAL SURGERY     heria  . EYE SURGERY         Home Medications    Prior to Admission medications   Medication Sig Start Date End Date Taking? Authorizing Provider  acetaminophen (TYLENOL) 500 MG tablet Take 500 mg every 8 (eight) hours as needed by mouth for mild pain.   Yes [provider]  albuterol (PROVENTIL HFA;VENTOLIN HFA) 108 (90 BASE) MCG/ACT inhaler Inhale 2 puffs into the lungs every 4 (four) hours as needed for wheezing or shortness of breath.   Yes  [provider]  albuterol (PROVENTIL) (2.5 MG/3ML) 0.083% nebulizer solution Take 2.5 mg every 6 (six) hours as needed by nebulization for wheezing or shortness of breath.   Yes [provider]  aspirin EC 81 MG tablet Take 81 mg by mouth daily.   Yes [provider]  baclofen (LIORESAL) 10 MG tablet Take 10 mg by mouth 2 (two) times daily.   Yes [provider]  buPROPion (WELLBUTRIN SR) 150 MG 12 hr tablet Take 150 mg by mouth 2 (two) times daily.   Yes [provider]  cyanocobalamin 1000 MCG tablet Take 1,000 mcg by mouth daily.   Yes [provider]  fluticasone (FLONASE) 50 MCG/ACT nasal spray Place 1 spray 2 (two) times daily into both nostrils.    Yes [provider]  Fluticasone-Salmeterol (ADVAIR) 250-50 MCG/DOSE AEPB Inhale 1 puff 2 (two) times daily into the lungs.   Yes [provider]  gabapentin (NEURONTIN) 400 MG capsule Take 400 mg by mouth 3 (three) times daily.   Yes [provider]  ipratropium (ATROVENT) 0.02 % nebulizer solution Take 0.5 mg 4 (four) times daily by nebulization.   Yes [provider]  memantine (NAMENDA) 10 MG tablet Take 10 mg by mouth 2 (two) times daily.   Yes [provider]  omeprazole (PRILOSEC) 20 MG capsule Take 20 mg by mouth  daily.   Yes [provider]  pramipexole (MIRAPEX) 0.125 MG tablet Take 0.125 mg by mouth at bedtime.   Yes [provider]  sennosides-docusate sodium (SENOKOT-S) 8.6-50 MG tablet Take 1 tablet daily as needed by mouth for constipation.   Yes [provider]  sertraline (ZOLOFT) 50 MG tablet Take 50 mg by mouth daily.   Yes [provider]  tamsulosin (FLOMAX) 0.4 MG CAPS capsule Take 0.4 mg by mouth daily as needed.    Yes [provider]  traMADol (ULTRAM) 50 MG tablet Take 50 mg 2 (two) times daily by mouth.   Yes [provider]  traZODone (DESYREL) 100 MG tablet Take 200 mg  by mouth at bedtime.   Yes [provider]    Family History No family history on file.  Social History Social History   Tobacco Use  . Smoking status: Current Every Day Smoker    Packs/day: 0.50    Types: Cigarettes  . Smokeless tobacco: Never Used  Substance Use Topics  . Alcohol use: No  . Drug use: No     Allergies   Bee venom; Penicillins; and Strawberry extract   Review of Systems Review of Systems  Constitutional: Negative for appetite change.  HENT: Negative for congestion.   Respiratory: Negative for shortness of breath.   Cardiovascular: Negative for chest pain.  Gastrointestinal: Negative for abdominal pain.  Musculoskeletal:       Right hip pain  Neurological: Negative for weakness and numbness.  Hematological: Negative for adenopathy.  Psychiatric/Behavioral: Negative for confusion.     Physical Exam Updated Vital Signs BP 140/74   Pulse (!) 50   Temp 97.8 F (36.6 C) (Oral)   Resp 16   Ht 6\' 3"  (1.905 m)   Wt 74.8 kg (165 lb)   SpO2 96%   BMI 20.62 kg/m   Physical Exam  Constitutional: He appears well-developed.  HENT:  Head: Atraumatic.  Eyes: Pupils are equal, round, and reactive to light.  Neck: Neck supple.  Cardiovascular: Normal rate.  Pulmonary/Chest: Effort normal.  Abdominal: Soft. There is no tenderness.  Musculoskeletal: He exhibits tenderness.  Some tenderness over right hip laterally.  Moderate to severe pain with movement of the right hip.  Neurovascular intact in bilateral feet.  No lumbar or other pelvic tenderness.  Neurological: He is alert.  Skin: Skin is warm.     ED Treatments / Results  Labs (all labs ordered are listed, but only abnormal results are displayed) Labs Reviewed - No data to display  EKG  EKG Interpretation None       Radiology Ct Hip Right Wo Contrast  Result Date: 01/30/2017 CLINICAL DATA:  76 year old male with trauma to the right hip and concern for fracture. EXAM: CT OF  THE RIGHT HIP WITHOUT CONTRAST TECHNIQUE: Multidetector CT imaging of the right hip was performed according to the standard protocol. Multiplanar CT image reconstructions were also generated. COMPARISON:  Radiograph dated 01/30/2017 FINDINGS: Bones/Joint/Cartilage There are mildly displaced fractures of the greater trochanter of the right femur. The fractures abut the superior tip of the femoral intramedullary rod. A linear hypodensity extending from the fractures of the greater trochanter inferiorly along the posterior aspect of the femur (series 2 images 62- 68 and series 6, image 85) is likely chronic and related to heterotopic bone and less likely represents an acute fracture. A linear area of high attenuation along the intratrochanteric ridge (series 6, image 65) likely represents sequela of prior fracture. No  other acute fracture noted. The bones are osteopenic which limits evaluation for fracture. Right femoral intramedullary rod and transcervical fixation screw noted. The orthopedic hardware appears intact. No evidence of hardware loosening. There is probable small amount of hematoma adjacent to the right trochanteric fracture. Ligaments Suboptimally assessed by CT. Muscles and Tendons There are ectopic ossification in the musculature superior to the greater trochanter of the right femur. There is probable small amount of hematoma in the musculature adjacent to the fractures of the greater trochanter. Soft tissues There is atherosclerotic calcification of the visualized pelvic vasculature. There is moderate amount of stool in the visualized colon. IMPRESSION: 1. Minimally displaced small fractures of the superior aspect of the greater trochanter of the right femur. The fracture abuts the superior edge of the femoral intramedullary rod. No other acute fracture identified. No dislocation. 2. The orthopedic hardware appears intact with no evidence of loosening. Electronically Signed   By: Anner Crete M.D.    On: 01/30/2017 20:35   Dg Hip Unilat W Or Wo Pelvis 2-3 Views Right  Result Date: 01/30/2017 CLINICAL DATA:  76 year old male with fall and right hip pain. EXAM: DG HIP (WITH OR WITHOUT PELVIS) 2-3V RIGHT COMPARISON:  None. FINDINGS: There is no acute fracture or dislocation. A right femoral intramedullary rod and transcervical screw is noted. The orthopedic hardware appears intact. There is degenerative changes of the lower lumbar spine. Ventral hernia repair mesh is noted. There is moderate stool throughout the colon. The soft tissues appear unremarkable. IMPRESSION: No acute fracture or dislocation. Electronically Signed   By: Anner Crete M.D.   On: 01/30/2017 18:18    Procedures Procedures (including critical care time)  Medications Ordered in ED Medications - No data to display   Initial Impression / Assessment and Plan / ED Course  I have reviewed the triage vital signs and the nursing notes.  Pertinent labs & imaging results that were available during my care of the patient were reviewed by me and considered in my medical decision making (see chart for details).     Presents with fall.  Right hip pain.  Found to have greater trochanter fracture.  Has had previous hip surgery on that side at [facility].  X-ray reassuring but CT scan showed fracture.  Discussed with Dr. Alvan Dame.  Weight-bear as tolerated.  Patient is already at a nursing home and has help there.  Feels as if he can manage it well.  Will be discharged home to follow-up with his orthopedic surgeon.  It was a mechanical fall.  Final Clinical Impressions(s) / ED Diagnoses   Final diagnoses:  Closed nondisplaced fracture of greater trochanter of right femur, initial encounter [facility])    ED Discharge Orders    None       Davonna Belling, MD 01/30/17 2232

## 2017-01-30 NOTE — ED Triage Notes (Signed)
Pt is from [facility] and fell upon standing today. Is complaining of right hip pain. All VSS. Has a history of falls as well as a past hip fracture. Hip not deformed upon assessment.

## 2017-01-30 NOTE — ED Notes (Signed)
To CT

## 2017-01-30 NOTE — Discharge Instructions (Signed)
Weight-bear as tolerated.  Continue use your pain medicines as needed for the pain.

## 2017-01-31 DIAGNOSIS — S72112D Displaced fracture of greater trochanter of left femur, subsequent encounter for closed fracture with routine healing: Secondary | ICD-10-CM | POA: Diagnosis not present

## 2017-01-31 DIAGNOSIS — S20211D Contusion of right front wall of thorax, subsequent encounter: Secondary | ICD-10-CM | POA: Diagnosis not present

## 2017-01-31 DIAGNOSIS — J449 Chronic obstructive pulmonary disease, unspecified: Secondary | ICD-10-CM | POA: Diagnosis not present

## 2017-01-31 DIAGNOSIS — M47816 Spondylosis without myelopathy or radiculopathy, lumbar region: Secondary | ICD-10-CM | POA: Diagnosis not present

## 2017-01-31 DIAGNOSIS — S72144D Nondisplaced intertrochanteric fracture of right femur, subsequent encounter for closed fracture with routine healing: Secondary | ICD-10-CM | POA: Diagnosis not present

## 2017-01-31 DIAGNOSIS — F0390 Unspecified dementia without behavioral disturbance: Secondary | ICD-10-CM | POA: Diagnosis not present

## 2017-02-01 DIAGNOSIS — S72144D Nondisplaced intertrochanteric fracture of right femur, subsequent encounter for closed fracture with routine healing: Secondary | ICD-10-CM | POA: Diagnosis not present

## 2017-02-01 DIAGNOSIS — S72112D Displaced fracture of greater trochanter of left femur, subsequent encounter for closed fracture with routine healing: Secondary | ICD-10-CM | POA: Diagnosis not present

## 2017-02-01 DIAGNOSIS — J449 Chronic obstructive pulmonary disease, unspecified: Secondary | ICD-10-CM | POA: Diagnosis not present

## 2017-02-01 DIAGNOSIS — F0390 Unspecified dementia without behavioral disturbance: Secondary | ICD-10-CM | POA: Diagnosis not present

## 2017-02-01 DIAGNOSIS — M47816 Spondylosis without myelopathy or radiculopathy, lumbar region: Secondary | ICD-10-CM | POA: Diagnosis not present

## 2017-02-01 DIAGNOSIS — S20211D Contusion of right front wall of thorax, subsequent encounter: Secondary | ICD-10-CM | POA: Diagnosis not present

## 2017-02-07 DIAGNOSIS — S20211D Contusion of right front wall of thorax, subsequent encounter: Secondary | ICD-10-CM | POA: Diagnosis not present

## 2017-02-07 DIAGNOSIS — J449 Chronic obstructive pulmonary disease, unspecified: Secondary | ICD-10-CM | POA: Diagnosis not present

## 2017-02-07 DIAGNOSIS — S72112D Displaced fracture of greater trochanter of left femur, subsequent encounter for closed fracture with routine healing: Secondary | ICD-10-CM | POA: Diagnosis not present

## 2017-02-07 DIAGNOSIS — F0390 Unspecified dementia without behavioral disturbance: Secondary | ICD-10-CM | POA: Diagnosis not present

## 2017-02-07 DIAGNOSIS — S72144D Nondisplaced intertrochanteric fracture of right femur, subsequent encounter for closed fracture with routine healing: Secondary | ICD-10-CM | POA: Diagnosis not present

## 2017-02-07 DIAGNOSIS — M47816 Spondylosis without myelopathy or radiculopathy, lumbar region: Secondary | ICD-10-CM | POA: Diagnosis not present

## 2017-02-08 DIAGNOSIS — S72112D Displaced fracture of greater trochanter of left femur, subsequent encounter for closed fracture with routine healing: Secondary | ICD-10-CM | POA: Diagnosis not present

## 2017-02-08 DIAGNOSIS — M80052D Age-related osteoporosis with current pathological fracture, left femur, subsequent encounter for fracture with routine healing: Secondary | ICD-10-CM | POA: Diagnosis not present

## 2017-02-08 DIAGNOSIS — M810 Age-related osteoporosis without current pathological fracture: Secondary | ICD-10-CM | POA: Diagnosis not present

## 2017-02-08 DIAGNOSIS — E559 Vitamin D deficiency, unspecified: Secondary | ICD-10-CM | POA: Diagnosis not present

## 2017-02-08 DIAGNOSIS — M899 Disorder of bone, unspecified: Secondary | ICD-10-CM | POA: Diagnosis not present

## 2017-02-08 DIAGNOSIS — M545 Low back pain: Secondary | ICD-10-CM | POA: Diagnosis not present

## 2017-02-08 DIAGNOSIS — S72144D Nondisplaced intertrochanteric fracture of right femur, subsequent encounter for closed fracture with routine healing: Secondary | ICD-10-CM | POA: Diagnosis not present

## 2017-02-08 DIAGNOSIS — K219 Gastro-esophageal reflux disease without esophagitis: Secondary | ICD-10-CM | POA: Diagnosis not present

## 2017-02-08 DIAGNOSIS — M80051D Age-related osteoporosis with current pathological fracture, right femur, subsequent encounter for fracture with routine healing: Secondary | ICD-10-CM | POA: Diagnosis not present

## 2017-02-08 DIAGNOSIS — J449 Chronic obstructive pulmonary disease, unspecified: Secondary | ICD-10-CM | POA: Diagnosis not present

## 2017-02-08 DIAGNOSIS — F0390 Unspecified dementia without behavioral disturbance: Secondary | ICD-10-CM | POA: Diagnosis not present

## 2017-02-08 DIAGNOSIS — Z9181 History of falling: Secondary | ICD-10-CM | POA: Diagnosis not present

## 2017-02-09 DIAGNOSIS — S72144D Nondisplaced intertrochanteric fracture of right femur, subsequent encounter for closed fracture with routine healing: Secondary | ICD-10-CM | POA: Diagnosis not present

## 2017-02-09 DIAGNOSIS — F0390 Unspecified dementia without behavioral disturbance: Secondary | ICD-10-CM | POA: Diagnosis not present

## 2017-02-09 DIAGNOSIS — S72112D Displaced fracture of greater trochanter of left femur, subsequent encounter for closed fracture with routine healing: Secondary | ICD-10-CM | POA: Diagnosis not present

## 2017-02-09 DIAGNOSIS — M47816 Spondylosis without myelopathy or radiculopathy, lumbar region: Secondary | ICD-10-CM | POA: Diagnosis not present

## 2017-02-09 DIAGNOSIS — J449 Chronic obstructive pulmonary disease, unspecified: Secondary | ICD-10-CM | POA: Diagnosis not present

## 2017-02-09 DIAGNOSIS — S20211D Contusion of right front wall of thorax, subsequent encounter: Secondary | ICD-10-CM | POA: Diagnosis not present

## 2017-02-10 DIAGNOSIS — C44329 Squamous cell carcinoma of skin of other parts of face: Secondary | ICD-10-CM | POA: Diagnosis not present

## 2017-02-11 ENCOUNTER — Other Ambulatory Visit: Payer: Self-pay | Admitting: Orthopaedic Surgery

## 2017-02-11 ENCOUNTER — Other Ambulatory Visit (HOSPITAL_COMMUNITY): Payer: Self-pay | Admitting: Orthopaedic Surgery

## 2017-02-11 DIAGNOSIS — M899 Disorder of bone, unspecified: Secondary | ICD-10-CM

## 2017-02-11 DIAGNOSIS — S72112D Displaced fracture of greater trochanter of left femur, subsequent encounter for closed fracture with routine healing: Secondary | ICD-10-CM

## 2017-02-14 DIAGNOSIS — B351 Tinea unguium: Secondary | ICD-10-CM | POA: Diagnosis not present

## 2017-02-14 DIAGNOSIS — M79674 Pain in right toe(s): Secondary | ICD-10-CM | POA: Diagnosis not present

## 2017-02-14 DIAGNOSIS — M79675 Pain in left toe(s): Secondary | ICD-10-CM | POA: Diagnosis not present

## 2017-02-15 DIAGNOSIS — S20211D Contusion of right front wall of thorax, subsequent encounter: Secondary | ICD-10-CM | POA: Diagnosis not present

## 2017-02-15 DIAGNOSIS — J449 Chronic obstructive pulmonary disease, unspecified: Secondary | ICD-10-CM | POA: Diagnosis not present

## 2017-02-15 DIAGNOSIS — S72112D Displaced fracture of greater trochanter of left femur, subsequent encounter for closed fracture with routine healing: Secondary | ICD-10-CM | POA: Diagnosis not present

## 2017-02-15 DIAGNOSIS — F0390 Unspecified dementia without behavioral disturbance: Secondary | ICD-10-CM | POA: Diagnosis not present

## 2017-02-15 DIAGNOSIS — M47816 Spondylosis without myelopathy or radiculopathy, lumbar region: Secondary | ICD-10-CM | POA: Diagnosis not present

## 2017-02-15 DIAGNOSIS — S72144D Nondisplaced intertrochanteric fracture of right femur, subsequent encounter for closed fracture with routine healing: Secondary | ICD-10-CM | POA: Diagnosis not present

## 2017-02-17 DIAGNOSIS — M47816 Spondylosis without myelopathy or radiculopathy, lumbar region: Secondary | ICD-10-CM | POA: Diagnosis not present

## 2017-02-17 DIAGNOSIS — S72144D Nondisplaced intertrochanteric fracture of right femur, subsequent encounter for closed fracture with routine healing: Secondary | ICD-10-CM | POA: Diagnosis not present

## 2017-02-17 DIAGNOSIS — F0390 Unspecified dementia without behavioral disturbance: Secondary | ICD-10-CM | POA: Diagnosis not present

## 2017-02-17 DIAGNOSIS — S20211D Contusion of right front wall of thorax, subsequent encounter: Secondary | ICD-10-CM | POA: Diagnosis not present

## 2017-02-17 DIAGNOSIS — J449 Chronic obstructive pulmonary disease, unspecified: Secondary | ICD-10-CM | POA: Diagnosis not present

## 2017-02-17 DIAGNOSIS — S72112D Displaced fracture of greater trochanter of left femur, subsequent encounter for closed fracture with routine healing: Secondary | ICD-10-CM | POA: Diagnosis not present

## 2017-03-04 ENCOUNTER — Ambulatory Visit (HOSPITAL_COMMUNITY)
Admission: RE | Admit: 2017-03-04 | Discharge: 2017-03-04 | Disposition: A | Discharge status: Home / Self Care | Payer: Medicare Other | Source: Ambulatory Visit | Attending: Orthopaedic Surgery | Admitting: Orthopaedic Surgery

## 2017-03-04 DIAGNOSIS — M7592 Shoulder lesion, unspecified, left shoulder: Secondary | ICD-10-CM | POA: Diagnosis not present

## 2017-03-04 DIAGNOSIS — X58XXXD Exposure to other specified factors, subsequent encounter: Secondary | ICD-10-CM | POA: Insufficient documentation

## 2017-03-04 DIAGNOSIS — M899 Disorder of bone, unspecified: Secondary | ICD-10-CM | POA: Insufficient documentation

## 2017-03-04 DIAGNOSIS — S72122A Displaced fracture of lesser trochanter of left femur, initial encounter for closed fracture: Secondary | ICD-10-CM | POA: Diagnosis not present

## 2017-03-04 DIAGNOSIS — S72112D Displaced fracture of greater trochanter of left femur, subsequent encounter for closed fracture with routine healing: Secondary | ICD-10-CM | POA: Diagnosis not present

## 2017-03-04 LAB — POCT I-STAT CREATININE: CREATININE: 1.4 mg/dL — AB (ref 0.61–1.24)

## 2017-03-04 MED ORDER — GADOBENATE DIMEGLUMINE 529 MG/ML IV SOLN
15.0000 mL | Freq: Once | INTRAVENOUS | Status: AC | PRN
  Administered 2017-03-04: 15 mL via INTRAVENOUS

## 2017-04-12 DIAGNOSIS — J449 Chronic obstructive pulmonary disease, unspecified: Secondary | ICD-10-CM | POA: Diagnosis not present

## 2017-04-12 DIAGNOSIS — M1990 Unspecified osteoarthritis, unspecified site: Secondary | ICD-10-CM | POA: Diagnosis not present

## 2017-04-12 DIAGNOSIS — K219 Gastro-esophageal reflux disease without esophagitis: Secondary | ICD-10-CM | POA: Diagnosis not present

## 2017-04-12 DIAGNOSIS — N183 Chronic kidney disease, stage 3 (moderate): Secondary | ICD-10-CM | POA: Diagnosis not present

## 2017-05-03 DIAGNOSIS — B351 Tinea unguium: Secondary | ICD-10-CM | POA: Diagnosis not present

## 2017-05-03 DIAGNOSIS — M79674 Pain in right toe(s): Secondary | ICD-10-CM | POA: Diagnosis not present

## 2017-05-03 DIAGNOSIS — M79675 Pain in left toe(s): Secondary | ICD-10-CM | POA: Diagnosis not present

## 2017-05-23 DIAGNOSIS — Z85828 Personal history of other malignant neoplasm of skin: Secondary | ICD-10-CM | POA: Diagnosis not present

## 2017-05-23 DIAGNOSIS — L432 Lichenoid drug reaction: Secondary | ICD-10-CM | POA: Diagnosis not present

## 2017-05-23 DIAGNOSIS — L570 Actinic keratosis: Secondary | ICD-10-CM | POA: Diagnosis not present

## 2017-05-23 DIAGNOSIS — L821 Other seborrheic keratosis: Secondary | ICD-10-CM | POA: Diagnosis not present

## 2017-05-23 DIAGNOSIS — D485 Neoplasm of uncertain behavior of skin: Secondary | ICD-10-CM | POA: Diagnosis not present

## 2017-05-23 DIAGNOSIS — C4442 Squamous cell carcinoma of skin of scalp and neck: Secondary | ICD-10-CM | POA: Diagnosis not present

## 2017-05-23 DIAGNOSIS — L0100 Impetigo, unspecified: Secondary | ICD-10-CM | POA: Diagnosis not present

## 2017-06-08 DIAGNOSIS — L989 Disorder of the skin and subcutaneous tissue, unspecified: Secondary | ICD-10-CM | POA: Diagnosis not present

## 2017-06-08 DIAGNOSIS — M6281 Muscle weakness (generalized): Secondary | ICD-10-CM | POA: Diagnosis not present

## 2017-06-08 DIAGNOSIS — F0390 Unspecified dementia without behavioral disturbance: Secondary | ICD-10-CM | POA: Diagnosis not present

## 2017-06-08 DIAGNOSIS — F329 Major depressive disorder, single episode, unspecified: Secondary | ICD-10-CM | POA: Diagnosis not present

## 2017-06-08 DIAGNOSIS — Z48817 Encounter for surgical aftercare following surgery on the skin and subcutaneous tissue: Secondary | ICD-10-CM | POA: Diagnosis not present

## 2017-06-08 DIAGNOSIS — Z9181 History of falling: Secondary | ICD-10-CM | POA: Diagnosis not present

## 2017-06-09 DIAGNOSIS — R404 Transient alteration of awareness: Secondary | ICD-10-CM | POA: Diagnosis not present

## 2017-06-09 DIAGNOSIS — M25552 Pain in left hip: Secondary | ICD-10-CM | POA: Diagnosis not present

## 2017-06-09 DIAGNOSIS — C4442 Squamous cell carcinoma of skin of scalp and neck: Secondary | ICD-10-CM | POA: Diagnosis not present

## 2017-06-09 DIAGNOSIS — R531 Weakness: Secondary | ICD-10-CM | POA: Diagnosis not present

## 2017-06-10 ENCOUNTER — Encounter (HOSPITAL_COMMUNITY): Payer: Self-pay | Admitting: Emergency Medicine

## 2017-06-10 ENCOUNTER — Emergency Department (HOSPITAL_COMMUNITY): Payer: Medicare Other

## 2017-06-10 ENCOUNTER — Emergency Department (HOSPITAL_COMMUNITY)
Admission: EM | Admit: 2017-06-10 | Discharge: 2017-06-10 | Disposition: A | Discharge status: Nursing Home (Includes ICF) | Payer: Medicare Other | Attending: Emergency Medicine | Admitting: Emergency Medicine

## 2017-06-10 ENCOUNTER — Other Ambulatory Visit: Payer: Self-pay

## 2017-06-10 DIAGNOSIS — Z7982 Long term (current) use of aspirin: Secondary | ICD-10-CM | POA: Insufficient documentation

## 2017-06-10 DIAGNOSIS — M25552 Pain in left hip: Secondary | ICD-10-CM

## 2017-06-10 DIAGNOSIS — L989 Disorder of the skin and subcutaneous tissue, unspecified: Secondary | ICD-10-CM | POA: Diagnosis not present

## 2017-06-10 DIAGNOSIS — F17210 Nicotine dependence, cigarettes, uncomplicated: Secondary | ICD-10-CM | POA: Diagnosis not present

## 2017-06-10 DIAGNOSIS — Z9181 History of falling: Secondary | ICD-10-CM | POA: Diagnosis not present

## 2017-06-10 DIAGNOSIS — N289 Disorder of kidney and ureter, unspecified: Secondary | ICD-10-CM

## 2017-06-10 DIAGNOSIS — W19XXXA Unspecified fall, initial encounter: Secondary | ICD-10-CM

## 2017-06-10 DIAGNOSIS — Z48817 Encounter for surgical aftercare following surgery on the skin and subcutaneous tissue: Secondary | ICD-10-CM | POA: Diagnosis not present

## 2017-06-10 DIAGNOSIS — R531 Weakness: Secondary | ICD-10-CM

## 2017-06-10 DIAGNOSIS — F329 Major depressive disorder, single episode, unspecified: Secondary | ICD-10-CM | POA: Insufficient documentation

## 2017-06-10 DIAGNOSIS — Z79899 Other long term (current) drug therapy: Secondary | ICD-10-CM | POA: Insufficient documentation

## 2017-06-10 DIAGNOSIS — M25551 Pain in right hip: Secondary | ICD-10-CM | POA: Diagnosis not present

## 2017-06-10 DIAGNOSIS — J45909 Unspecified asthma, uncomplicated: Secondary | ICD-10-CM | POA: Insufficient documentation

## 2017-06-10 DIAGNOSIS — M6281 Muscle weakness (generalized): Secondary | ICD-10-CM | POA: Diagnosis not present

## 2017-06-10 DIAGNOSIS — S79911A Unspecified injury of right hip, initial encounter: Secondary | ICD-10-CM | POA: Diagnosis not present

## 2017-06-10 DIAGNOSIS — F0390 Unspecified dementia without behavioral disturbance: Secondary | ICD-10-CM | POA: Diagnosis not present

## 2017-06-10 DIAGNOSIS — S79922A Unspecified injury of left thigh, initial encounter: Secondary | ICD-10-CM | POA: Diagnosis not present

## 2017-06-10 DIAGNOSIS — S72111A Displaced fracture of greater trochanter of right femur, initial encounter for closed fracture: Secondary | ICD-10-CM | POA: Diagnosis not present

## 2017-06-10 DIAGNOSIS — S79912A Unspecified injury of left hip, initial encounter: Secondary | ICD-10-CM | POA: Diagnosis not present

## 2017-06-10 DIAGNOSIS — N189 Chronic kidney disease, unspecified: Secondary | ICD-10-CM | POA: Diagnosis not present

## 2017-06-10 LAB — URINALYSIS, ROUTINE W REFLEX MICROSCOPIC
Bilirubin Urine: NEGATIVE
Glucose, UA: NEGATIVE mg/dL
HGB URINE DIPSTICK: NEGATIVE
KETONES UR: NEGATIVE mg/dL
Leukocytes, UA: NEGATIVE
NITRITE: NEGATIVE
PH: 6 (ref 5.0–8.0)
Protein, ur: NEGATIVE mg/dL
SPECIFIC GRAVITY, URINE: 1.01 (ref 1.005–1.030)

## 2017-06-10 LAB — CBC WITH DIFFERENTIAL/PLATELET
Basophils Absolute: 0 10*3/uL (ref 0.0–0.1)
Basophils Relative: 1 %
EOS ABS: 0.6 10*3/uL (ref 0.0–0.7)
Eosinophils Relative: 9 %
HEMATOCRIT: 42.7 % (ref 39.0–52.0)
HEMOGLOBIN: 13.1 g/dL (ref 13.0–17.0)
LYMPHS ABS: 0.8 10*3/uL (ref 0.7–4.0)
Lymphocytes Relative: 13 %
MCH: 30.1 pg (ref 26.0–34.0)
MCHC: 30.7 g/dL (ref 30.0–36.0)
MCV: 98.2 fL (ref 78.0–100.0)
MONO ABS: 0.5 10*3/uL (ref 0.1–1.0)
MONOS PCT: 9 %
NEUTROS ABS: 4.1 10*3/uL (ref 1.7–7.7)
Neutrophils Relative %: 68 %
Platelets: 197 10*3/uL (ref 150–400)
RBC: 4.35 MIL/uL (ref 4.22–5.81)
RDW: 15.2 % (ref 11.5–15.5)
WBC: 5.9 10*3/uL (ref 4.0–10.5)

## 2017-06-10 LAB — BASIC METABOLIC PANEL
Anion gap: 8 (ref 5–15)
BUN: 39 mg/dL — ABNORMAL HIGH (ref 6–20)
CALCIUM: 9.2 mg/dL (ref 8.9–10.3)
CHLORIDE: 110 mmol/L (ref 101–111)
CO2: 25 mmol/L (ref 22–32)
CREATININE: 2.07 mg/dL — AB (ref 0.61–1.24)
GFR calc Af Amer: 34 mL/min — ABNORMAL LOW (ref >60)
GFR calc non Af Amer: 29 mL/min — ABNORMAL LOW (ref >60)
GLUCOSE: 114 mg/dL — AB (ref 65–99)
Potassium: 4.3 mmol/L (ref 3.5–5.1)
Sodium: 143 mmol/L (ref 135–145)

## 2017-06-10 MED ORDER — SODIUM CHLORIDE 0.9 % IV BOLUS
1000.0000 mL | Freq: Once | INTRAVENOUS | Status: AC
  Administered 2017-06-10: 1000 mL via INTRAVENOUS

## 2017-06-10 MED ORDER — FENTANYL CITRATE (PF) 100 MCG/2ML IJ SOLN
50.0000 ug | Freq: Once | INTRAMUSCULAR | Status: AC
  Administered 2017-06-10: 50 ug via INTRAVENOUS
  Filled 2017-06-10: qty 2

## 2017-06-10 NOTE — ED Provider Notes (Signed)
**Anthony [name] De-Identified via Obfuscation** Anthony Anthony [name]   CSN: [id number] Arrival date & time: 06/10/17  0003     History   Chief Complaint Chief Complaint  Patient presents with  . Hip Pain    from fall  level 5 caveat due to dementia  HPI Anthony Anthony [name] is a 77 y.o. male.  The history is provided by the patient and the nursing home. The history is limited by the condition of the patient.  Hip Pain  This is a new problem. Episode onset: prior to arrival. The problem occurs constantly. The problem has not changed since onset.Pertinent negatives include no chest pain and no abdominal pain. The symptoms are aggravated by standing. The symptoms are relieved by rest.   Patient history of dementia, hip pain, COPD presents with right hip pain.  Apparently he was at the nursing facility and was trying to stand up, but was unable to stand and was put to the ground by staff.  No head injury.  Since that time was reporting right hip pain. Per nursing home staff, he is usually able to stand and transfer, but over the past 24 hours had generalized weakness.  Of Anthony [name], he just had a cyst removed from his scalp yesterday.  Bandage in place Past Medical History:  Diagnosis Date  . Acute pyelonephritis 12/23/2014  . Asthma   . BPH (benign prostatic hyperplasia)   . Chronic back pain   . Chronic hip pain   . COPD (chronic obstructive pulmonary disease) ([place])   . Dementia   . Depression   . GERD (gastroesophageal reflux disease)   . Kidney stone   . Neuropathy   . Pulmonary nodules 12/25/2014  . Restless leg syndrome     Patient Active Problem List   Diagnosis Date Noted  . S/p left hip fracture   . HCAP (healthcare-associated pneumonia) 10/31/2016  . CKD (chronic kidney disease) 10/31/2016  . COPD (chronic obstructive pulmonary disease) ([place]) 10/31/2016  . Hyponatremia 12/25/2014  . Hypokalemia 12/25/2014  . Normocytic anemia 12/25/2014  . Pulmonary nodules 12/25/2014  . Tobacco abuse  12/25/2014  . ARF (acute renal failure) ([place]) 12/24/2014  . Acute pyelonephritis 12/23/2014    Past Surgical History:  Procedure Laterality Date  . ABDOMINAL SURGERY     heria  . EYE SURGERY          Home Medications    Prior to Admission medications   Medication Sig Start Date End Date Taking? Authorizing Provider  acetaminophen (TYLENOL) 500 MG tablet Take 500 mg every 8 (eight) hours as needed by mouth for mild pain.    [provider]  albuterol (PROVENTIL HFA;VENTOLIN HFA) 108 (90 BASE) MCG/ACT inhaler Inhale 2 puffs into the lungs every 4 (four) hours as needed for wheezing or shortness of breath.    [provider]  albuterol (PROVENTIL) (2.5 MG/3ML) 0.083% nebulizer solution Take 2.5 mg every 6 (six) hours as needed by nebulization for wheezing or shortness of breath.    [provider]  aspirin EC 81 MG tablet Take 81 mg by mouth daily.    [provider]  baclofen (LIORESAL) 10 MG tablet Take 10 mg by mouth 2 (two) times daily.    [provider]  buPROPion (WELLBUTRIN SR) 150 MG 12 hr tablet Take 150 mg by mouth 2 (two) times daily.    [provider]  cyanocobalamin 1000 MCG tablet Take 1,000 mcg by mouth daily.    [provider]  fluticasone (FLONASE) 50  MCG/ACT nasal spray Place 1 spray 2 (two) times daily into both nostrils.     [provider]  Fluticasone-Salmeterol (ADVAIR) 250-50 MCG/DOSE AEPB Inhale 1 puff 2 (two) times daily into the lungs.    [provider]  gabapentin (NEURONTIN) 400 MG capsule Take 400 mg by mouth 3 (three) times daily.    [provider]  ipratropium (ATROVENT) 0.02 % nebulizer solution Take 0.5 mg 4 (four) times daily by nebulization.    [provider]  memantine (NAMENDA) 10 MG tablet Take 10 mg by mouth 2 (two) times daily.    [provider]  omeprazole (PRILOSEC) 20 MG capsule Take 20 mg by mouth daily.    [provider]   pramipexole (MIRAPEX) 0.125 MG tablet Take 0.125 mg by mouth at bedtime.    [provider]  sennosides-docusate sodium (SENOKOT-S) 8.6-50 MG tablet Take 1 tablet daily as needed by mouth for constipation.    [provider]  sertraline (ZOLOFT) 50 MG tablet Take 50 mg by mouth daily.    [provider]  tamsulosin (FLOMAX) 0.4 MG CAPS capsule Take 0.4 mg by mouth daily as needed.     [provider]  traMADol (ULTRAM) 50 MG tablet Take 50 mg 2 (two) times daily by mouth.    [provider]  traZODone (DESYREL) 100 MG tablet Take 200 mg by mouth at bedtime.    [provider]    Family History History reviewed. No pertinent family history.  Social History Social History   Tobacco Use  . Smoking status: Current Every Day Smoker    Packs/day: 0.50    Types: Cigarettes  . Smokeless tobacco: Never Used  Substance Use Topics  . Alcohol use: No  . Drug use: No     Allergies   Bee venom; Penicillins; and Strawberry extract   Review of Systems Review of Systems  Unable to perform ROS: Dementia  Cardiovascular: Negative for chest pain.  Gastrointestinal: Negative for abdominal pain.     Physical Exam Updated Vital Signs BP 135/68   Pulse 63   Temp 97.7 F (36.5 C) (Oral)   Resp 16   Ht 1.88 m (6\' 2" )   Wt 74.8 kg (165 lb)   SpO2 95%   BMI 21.18 kg/m   Physical Exam CONSTITUTIONAL: Elderly and frail HEAD: Bandage noted the posterior scalp, no signs of trauma EYES: EOMI/PERRL ENMT: Mucous membranes moist NECK: supple no meningeal signs SPINE/BACK:entire spine nontender CV: S1/S2 noted, no murmurs/rubs/gallops noted LUNGS: Lungs are clear to auscultation bilaterally, no apparent distress ABDOMEN: soft, nontender NEURO: Pt is awake/alert, moves all extremitiesx4.  No facial droop.  He appears confused EXTREMITIES: pulses normal/equal, full ROM Pelvis stable, tenderness with range of motion of right hip.  All  other extremities/joints palpated/ranged and nontender SKIN: warm, color normal   ED Treatments / Results  Labs (all labs ordered are listed, but only abnormal results are displayed) Labs Reviewed  BASIC METABOLIC PANEL - Abnormal; Notable for the following components:      Result Value   Glucose, Bld 114 (*)    BUN 39 (*)    Creatinine, Ser 2.07 (*)    GFR calc non Af Amer 29 (*)    GFR calc Af Amer 34 (*)    All other components within normal limits  URINALYSIS, ROUTINE W REFLEX MICROSCOPIC - Abnormal; Notable for the following components:   Color, Urine STRAW (*)    All other components within normal  limits  CBC WITH DIFFERENTIAL/PLATELET    EKG EKG Interpretation  Date/Time:  Friday June 10 2017 00:17:38 EDT Ventricular Rate:  68 PR Interval:    QRS Duration: 127 QT Interval:  471 QTC Calculation: 501 R Axis:   -57 Text Interpretation:  Sinus rhythm Ventricular premature complex Left bundle branch block No significant change since last tracing Confirmed by Ripley Fraise [id number]) on 06/10/2017 2:38:29 AM   Radiology Dg Femur Portable 1 View Left  Result Date: 06/10/2017 CLINICAL DATA:  77 year old male and fall.  Left hip fracture. EXAM: LEFT FEMUR PORTABLE 1 VIEW COMPARISON:  Bilateral hip radiograph dated 06/10/2017 and pelvic MRI dated 03/04/2017 FINDINGS: No definite fracture noted on the single provided view of the hip. Foreshortened appearance of the left femoral neck, likely positional. The bones are osteopenic. Mild arthritic changes of the left knee. IMPRESSION: No definite acute fracture on single view. Electronically Signed   By: Anner Crete M.D.   On: 06/10/2017 06:37   Dg Femur Portable 1 View Right  Result Date: 06/10/2017 CLINICAL DATA:  77 year old male with fall and right hip pain. EXAM: RIGHT FEMUR PORTABLE 1 VIEW COMPARISON:  Right hip radiograph dated 08/21/2016 and MRI dated 03/04/2017 and CT of the right hip dated 01/30/2017 FINDINGS:  Evaluation is limited due to osteopenia and patient's body habitus. There is no acute fracture or dislocation on the provided images. Fracture of the superior portion of the greater trochanter of the right femur similar to prior CT. Right femoral intramedullary rod and transcervical screw appears intact. There is mild arthritic changes of the knee. IMPRESSION: No acute/new fracture. Fracture of the superior portion of the greater trochanter appears similar to the prior CT. Electronically Signed   By: Anner Crete M.D.   On: 06/10/2017 06:34   Dg Hips Bilat With Pelvis Min 5 Views  Result Date: 06/10/2017 CLINICAL DATA:  77 year old male status post fall from standing. Right greater than left hip pain. EXAM: DG HIP (WITH OR WITHOUT PELVIS) 5+V BILAT COMPARISON:  Right hip series and CT 01/30/2017 and earlier. FINDINGS: AP supine view of the pelvis. Femoral heads remain normally located. Hip joint spaces are stable. The pelvis appears stable and intact. The proximal left femur appears stable and intact. Sequelae of right femur ORIF re-demonstrated with a stable appearance of the visible right femur intramedullary rod with interlocking hip screw and more distal interlocking cortical screw. The entire intramedullary rod is not included, but the visible hardware appears intact. Unchanged appearance of the right greater trochanter since 2018. No acute right hip fracture identified. IMPRESSION: No acute fracture or dislocation identified about the bilateral hips or pelvis. Previous right femur ORIF, the visible hardware is stable. Electronically Signed   By: Genevie Ann M.D.   On: 06/10/2017 01:26    Procedures Procedures (including critical care time)  Medications Ordered in ED Medications  fentaNYL (SUBLIMAZE) injection 50 mcg (50 mcg Intravenous Given 06/10/17 0117)  sodium chloride 0.9 % bolus 1,000 mL (0 mLs Intravenous Stopped 06/10/17 0438)  fentaNYL (SUBLIMAZE) injection 50 mcg (50 mcg Intravenous Given  06/10/17 0455)     Initial Impression / Assessment and Plan / ED Course  I have reviewed the triage vital signs and the nursing notes.  Pertinent labs & imaging results that were available during my care of the patient were reviewed by me and considered in my medical decision making (see chart for details).     5:46 AM From nursing home for fall and generalized weakness.  He has no gross focal weakness and exam 7:17 AM Reports he feels improved.  He feels his weakness is improved.  He is moving around the bed in no distress.  My suspicion for occult fracture in his lower extremities is low.  Imaging does not reveal fracture, but he now admits to having chronic pain in his hips.  He feels that he can go back to his facility and pivot into a wheelchair.  Patient did have mild worsening of his renal function, was given IV fluids.  This is been seen before.  Advised this needs to be followed up on a week Final Clinical Impressions(s) / ED Diagnoses   Final diagnoses:  Right hip pain  Left hip pain  Fall, initial encounter  Weakness  Renal insufficiency    ED Discharge Orders    None       Ripley Fraise, MD 06/10/17 [phone]

## 2017-06-10 NOTE — Discharge Instructions (Addendum)
Be sure to increase your fluids, and have a recheck of your kidney function in 1 week

## 2017-06-10 NOTE — ED Notes (Signed)
RN and NT changed pt's brief and provided peri-care.  Urine x1 and stool smear.

## 2017-06-10 NOTE — ED Triage Notes (Addendum)
Per [facility] pt is from [facility] and is normally incontinent of urine, pt was standing while staff was cleaning and changing him when his leg gave out and pt fell, denies LOC or hitting head, per [facility] pt was c/o L hip pain but upon nursing examination pt is c/o pain in both hips but severe pain in R hip, pt has hx of R hip fracture

## 2017-06-10 NOTE — ED Notes (Signed)
RN and NA went into room to assist patient in standing per MD request. Pt was unable to stand and bare weight due to weakness.  Will notify MD.

## 2017-06-13 DIAGNOSIS — F329 Major depressive disorder, single episode, unspecified: Secondary | ICD-10-CM | POA: Diagnosis not present

## 2017-06-13 DIAGNOSIS — F0390 Unspecified dementia without behavioral disturbance: Secondary | ICD-10-CM | POA: Diagnosis not present

## 2017-06-13 DIAGNOSIS — M6281 Muscle weakness (generalized): Secondary | ICD-10-CM | POA: Diagnosis not present

## 2017-06-13 DIAGNOSIS — Z9181 History of falling: Secondary | ICD-10-CM | POA: Diagnosis not present

## 2017-06-13 DIAGNOSIS — L989 Disorder of the skin and subcutaneous tissue, unspecified: Secondary | ICD-10-CM | POA: Diagnosis not present

## 2017-06-13 DIAGNOSIS — Z48817 Encounter for surgical aftercare following surgery on the skin and subcutaneous tissue: Secondary | ICD-10-CM | POA: Diagnosis not present

## 2017-06-14 DIAGNOSIS — Z48817 Encounter for surgical aftercare following surgery on the skin and subcutaneous tissue: Secondary | ICD-10-CM | POA: Diagnosis not present

## 2017-06-14 DIAGNOSIS — F0390 Unspecified dementia without behavioral disturbance: Secondary | ICD-10-CM | POA: Diagnosis not present

## 2017-06-14 DIAGNOSIS — Z9181 History of falling: Secondary | ICD-10-CM | POA: Diagnosis not present

## 2017-06-14 DIAGNOSIS — F329 Major depressive disorder, single episode, unspecified: Secondary | ICD-10-CM | POA: Diagnosis not present

## 2017-06-14 DIAGNOSIS — L989 Disorder of the skin and subcutaneous tissue, unspecified: Secondary | ICD-10-CM | POA: Diagnosis not present

## 2017-06-14 DIAGNOSIS — M6281 Muscle weakness (generalized): Secondary | ICD-10-CM | POA: Diagnosis not present

## 2017-06-15 DIAGNOSIS — Z9181 History of falling: Secondary | ICD-10-CM | POA: Diagnosis not present

## 2017-06-15 DIAGNOSIS — L989 Disorder of the skin and subcutaneous tissue, unspecified: Secondary | ICD-10-CM | POA: Diagnosis not present

## 2017-06-15 DIAGNOSIS — Z48817 Encounter for surgical aftercare following surgery on the skin and subcutaneous tissue: Secondary | ICD-10-CM | POA: Diagnosis not present

## 2017-06-15 DIAGNOSIS — M6281 Muscle weakness (generalized): Secondary | ICD-10-CM | POA: Diagnosis not present

## 2017-06-15 DIAGNOSIS — F0390 Unspecified dementia without behavioral disturbance: Secondary | ICD-10-CM | POA: Diagnosis not present

## 2017-06-15 DIAGNOSIS — F329 Major depressive disorder, single episode, unspecified: Secondary | ICD-10-CM | POA: Diagnosis not present

## 2017-06-16 DIAGNOSIS — F329 Major depressive disorder, single episode, unspecified: Secondary | ICD-10-CM | POA: Diagnosis not present

## 2017-06-16 DIAGNOSIS — M6281 Muscle weakness (generalized): Secondary | ICD-10-CM | POA: Diagnosis not present

## 2017-06-16 DIAGNOSIS — F0390 Unspecified dementia without behavioral disturbance: Secondary | ICD-10-CM | POA: Diagnosis not present

## 2017-06-16 DIAGNOSIS — Z9181 History of falling: Secondary | ICD-10-CM | POA: Diagnosis not present

## 2017-06-16 DIAGNOSIS — L989 Disorder of the skin and subcutaneous tissue, unspecified: Secondary | ICD-10-CM | POA: Diagnosis not present

## 2017-06-16 DIAGNOSIS — Z48817 Encounter for surgical aftercare following surgery on the skin and subcutaneous tissue: Secondary | ICD-10-CM | POA: Diagnosis not present

## 2017-06-18 ENCOUNTER — Inpatient Hospital Stay (HOSPITAL_COMMUNITY)
Admission: EM | Admit: 2017-06-18 | Discharge: 2017-06-21 | DRG: 194 | Disposition: A | Discharge status: Skilled Nursing Facility | Payer: Medicare Other | Attending: Internal Medicine | Admitting: Internal Medicine

## 2017-06-18 DIAGNOSIS — Z79899 Other long term (current) drug therapy: Secondary | ICD-10-CM

## 2017-06-18 DIAGNOSIS — F329 Major depressive disorder, single episode, unspecified: Secondary | ICD-10-CM | POA: Diagnosis present

## 2017-06-18 DIAGNOSIS — K219 Gastro-esophageal reflux disease without esophagitis: Secondary | ICD-10-CM | POA: Diagnosis present

## 2017-06-18 DIAGNOSIS — N183 Chronic kidney disease, stage 3 unspecified: Secondary | ICD-10-CM | POA: Diagnosis present

## 2017-06-18 DIAGNOSIS — Y95 Nosocomial condition: Secondary | ICD-10-CM | POA: Diagnosis present

## 2017-06-18 DIAGNOSIS — J440 Chronic obstructive pulmonary disease with acute lower respiratory infection: Secondary | ICD-10-CM | POA: Diagnosis present

## 2017-06-18 DIAGNOSIS — N289 Disorder of kidney and ureter, unspecified: Secondary | ICD-10-CM | POA: Diagnosis not present

## 2017-06-18 DIAGNOSIS — D649 Anemia, unspecified: Secondary | ICD-10-CM | POA: Diagnosis not present

## 2017-06-18 DIAGNOSIS — N400 Enlarged prostate without lower urinary tract symptoms: Secondary | ICD-10-CM | POA: Diagnosis present

## 2017-06-18 DIAGNOSIS — D631 Anemia in chronic kidney disease: Secondary | ICD-10-CM | POA: Diagnosis present

## 2017-06-18 DIAGNOSIS — F17210 Nicotine dependence, cigarettes, uncomplicated: Secondary | ICD-10-CM | POA: Diagnosis not present

## 2017-06-18 DIAGNOSIS — Z9102 Food additives allergy status: Secondary | ICD-10-CM

## 2017-06-18 DIAGNOSIS — Z880 Allergy status to penicillin: Secondary | ICD-10-CM

## 2017-06-18 DIAGNOSIS — J181 Lobar pneumonia, unspecified organism: Principal | ICD-10-CM | POA: Diagnosis present

## 2017-06-18 DIAGNOSIS — S0100XA Unspecified open wound of scalp, initial encounter: Secondary | ICD-10-CM

## 2017-06-18 DIAGNOSIS — Z7951 Long term (current) use of inhaled steroids: Secondary | ICD-10-CM

## 2017-06-18 DIAGNOSIS — J189 Pneumonia, unspecified organism: Secondary | ICD-10-CM | POA: Diagnosis not present

## 2017-06-18 DIAGNOSIS — L89899 Pressure ulcer of other site, unspecified stage: Secondary | ICD-10-CM | POA: Diagnosis present

## 2017-06-18 DIAGNOSIS — F0390 Unspecified dementia without behavioral disturbance: Secondary | ICD-10-CM | POA: Diagnosis not present

## 2017-06-18 DIAGNOSIS — Z91030 Bee allergy status: Secondary | ICD-10-CM

## 2017-06-18 DIAGNOSIS — R509 Fever, unspecified: Secondary | ICD-10-CM | POA: Diagnosis not present

## 2017-06-18 DIAGNOSIS — I447 Left bundle-branch block, unspecified: Secondary | ICD-10-CM | POA: Diagnosis present

## 2017-06-18 DIAGNOSIS — G2581 Restless legs syndrome: Secondary | ICD-10-CM | POA: Diagnosis present

## 2017-06-18 DIAGNOSIS — L8990 Pressure ulcer of unspecified site, unspecified stage: Secondary | ICD-10-CM | POA: Diagnosis present

## 2017-06-18 DIAGNOSIS — Z7982 Long term (current) use of aspirin: Secondary | ICD-10-CM

## 2017-06-19 ENCOUNTER — Other Ambulatory Visit: Payer: Self-pay

## 2017-06-19 ENCOUNTER — Encounter (HOSPITAL_COMMUNITY): Payer: Self-pay | Admitting: *Deleted

## 2017-06-19 ENCOUNTER — Emergency Department (HOSPITAL_COMMUNITY): Payer: Medicare Other

## 2017-06-19 DIAGNOSIS — Z880 Allergy status to penicillin: Secondary | ICD-10-CM | POA: Diagnosis not present

## 2017-06-19 DIAGNOSIS — I447 Left bundle-branch block, unspecified: Secondary | ICD-10-CM | POA: Diagnosis present

## 2017-06-19 DIAGNOSIS — Z9102 Food additives allergy status: Secondary | ICD-10-CM | POA: Diagnosis not present

## 2017-06-19 DIAGNOSIS — N183 Chronic kidney disease, stage 3 unspecified: Secondary | ICD-10-CM | POA: Diagnosis present

## 2017-06-19 DIAGNOSIS — F0390 Unspecified dementia without behavioral disturbance: Secondary | ICD-10-CM | POA: Diagnosis present

## 2017-06-19 DIAGNOSIS — J440 Chronic obstructive pulmonary disease with acute lower respiratory infection: Secondary | ICD-10-CM | POA: Diagnosis present

## 2017-06-19 DIAGNOSIS — S0100XA Unspecified open wound of scalp, initial encounter: Secondary | ICD-10-CM | POA: Diagnosis not present

## 2017-06-19 DIAGNOSIS — J189 Pneumonia, unspecified organism: Secondary | ICD-10-CM | POA: Diagnosis not present

## 2017-06-19 DIAGNOSIS — K219 Gastro-esophageal reflux disease without esophagitis: Secondary | ICD-10-CM | POA: Diagnosis present

## 2017-06-19 DIAGNOSIS — L8990 Pressure ulcer of unspecified site, unspecified stage: Secondary | ICD-10-CM | POA: Diagnosis present

## 2017-06-19 DIAGNOSIS — Z7982 Long term (current) use of aspirin: Secondary | ICD-10-CM | POA: Diagnosis not present

## 2017-06-19 DIAGNOSIS — F17210 Nicotine dependence, cigarettes, uncomplicated: Secondary | ICD-10-CM | POA: Diagnosis present

## 2017-06-19 DIAGNOSIS — L89899 Pressure ulcer of other site, unspecified stage: Secondary | ICD-10-CM | POA: Diagnosis present

## 2017-06-19 DIAGNOSIS — N289 Disorder of kidney and ureter, unspecified: Secondary | ICD-10-CM | POA: Diagnosis not present

## 2017-06-19 DIAGNOSIS — Z79899 Other long term (current) drug therapy: Secondary | ICD-10-CM | POA: Diagnosis not present

## 2017-06-19 DIAGNOSIS — J181 Lobar pneumonia, unspecified organism: Secondary | ICD-10-CM | POA: Diagnosis present

## 2017-06-19 DIAGNOSIS — N400 Enlarged prostate without lower urinary tract symptoms: Secondary | ICD-10-CM | POA: Diagnosis present

## 2017-06-19 DIAGNOSIS — G2581 Restless legs syndrome: Secondary | ICD-10-CM | POA: Diagnosis present

## 2017-06-19 DIAGNOSIS — D631 Anemia in chronic kidney disease: Secondary | ICD-10-CM | POA: Diagnosis present

## 2017-06-19 DIAGNOSIS — F329 Major depressive disorder, single episode, unspecified: Secondary | ICD-10-CM | POA: Diagnosis present

## 2017-06-19 DIAGNOSIS — Y95 Nosocomial condition: Secondary | ICD-10-CM | POA: Diagnosis present

## 2017-06-19 DIAGNOSIS — Z91030 Bee allergy status: Secondary | ICD-10-CM | POA: Diagnosis not present

## 2017-06-19 DIAGNOSIS — Z7951 Long term (current) use of inhaled steroids: Secondary | ICD-10-CM | POA: Diagnosis not present

## 2017-06-19 DIAGNOSIS — L89893 Pressure ulcer of other site, stage 3: Secondary | ICD-10-CM | POA: Diagnosis not present

## 2017-06-19 DIAGNOSIS — D649 Anemia, unspecified: Secondary | ICD-10-CM

## 2017-06-19 LAB — CBC WITH DIFFERENTIAL/PLATELET
BASOS PCT: 0 %
Basophils Absolute: 0 10*3/uL (ref 0.0–0.1)
EOS ABS: 0.2 10*3/uL (ref 0.0–0.7)
EOS PCT: 2 %
HCT: 34 % — ABNORMAL LOW (ref 39.0–52.0)
Hemoglobin: 10.7 g/dL — ABNORMAL LOW (ref 13.0–17.0)
LYMPHS PCT: 5 %
Lymphs Abs: 0.7 10*3/uL (ref 0.7–4.0)
MCH: 30.1 pg (ref 26.0–34.0)
MCHC: 31.5 g/dL (ref 30.0–36.0)
MCV: 95.5 fL (ref 78.0–100.0)
MONO ABS: 1.4 10*3/uL — AB (ref 0.1–1.0)
Monocytes Relative: 10 %
Neutro Abs: 12.1 10*3/uL — ABNORMAL HIGH (ref 1.7–7.7)
Neutrophils Relative %: 83 %
PLATELETS: 181 10*3/uL (ref 150–400)
RBC: 3.56 MIL/uL — AB (ref 4.22–5.81)
RDW: 14.9 % (ref 11.5–15.5)
WBC: 14.4 10*3/uL — AB (ref 4.0–10.5)

## 2017-06-19 LAB — BASIC METABOLIC PANEL
ANION GAP: 9 (ref 5–15)
BUN: 36 mg/dL — ABNORMAL HIGH (ref 6–20)
CALCIUM: 8.3 mg/dL — AB (ref 8.9–10.3)
CO2: 24 mmol/L (ref 22–32)
Chloride: 107 mmol/L (ref 101–111)
Creatinine, Ser: 1.7 mg/dL — ABNORMAL HIGH (ref 0.61–1.24)
GFR calc non Af Amer: 37 mL/min — ABNORMAL LOW (ref >60)
GFR, EST AFRICAN AMERICAN: 43 mL/min — AB (ref >60)
GLUCOSE: 105 mg/dL — AB (ref 65–99)
Potassium: 3.8 mmol/L (ref 3.5–5.1)
Sodium: 140 mmol/L (ref 135–145)

## 2017-06-19 LAB — COMPREHENSIVE METABOLIC PANEL
ALBUMIN: 3.2 g/dL — AB (ref 3.5–5.0)
ALK PHOS: 58 U/L (ref 38–126)
ALT: 10 U/L — AB (ref 17–63)
AST: 16 U/L (ref 15–41)
Anion gap: 7 (ref 5–15)
BUN: 39 mg/dL — ABNORMAL HIGH (ref 6–20)
CALCIUM: 8.3 mg/dL — AB (ref 8.9–10.3)
CO2: 23 mmol/L (ref 22–32)
CREATININE: 1.8 mg/dL — AB (ref 0.61–1.24)
Chloride: 103 mmol/L (ref 101–111)
GFR calc Af Amer: 40 mL/min — ABNORMAL LOW (ref >60)
GFR, EST NON AFRICAN AMERICAN: 34 mL/min — AB (ref >60)
GLUCOSE: 128 mg/dL — AB (ref 65–99)
Potassium: 4.1 mmol/L (ref 3.5–5.1)
Sodium: 133 mmol/L — ABNORMAL LOW (ref 135–145)
TOTAL PROTEIN: 6.6 g/dL (ref 6.5–8.1)
Total Bilirubin: 0.7 mg/dL (ref 0.3–1.2)

## 2017-06-19 LAB — URINALYSIS, ROUTINE W REFLEX MICROSCOPIC
Bilirubin Urine: NEGATIVE
Glucose, UA: NEGATIVE mg/dL
Hgb urine dipstick: NEGATIVE
KETONES UR: NEGATIVE mg/dL
LEUKOCYTES UA: NEGATIVE
NITRITE: NEGATIVE
PROTEIN: NEGATIVE mg/dL
Specific Gravity, Urine: 1.006 (ref 1.005–1.030)
pH: 6 (ref 5.0–8.0)

## 2017-06-19 LAB — INFLUENZA PANEL BY PCR (TYPE A & B)
Influenza A By PCR: NEGATIVE
Influenza B By PCR: NEGATIVE

## 2017-06-19 LAB — FOLATE: FOLATE: 8.8 ng/mL (ref >5.9)

## 2017-06-19 LAB — RETICULOCYTES
RBC.: 3.55 MIL/uL — ABNORMAL LOW (ref 4.22–5.81)
RETIC CT PCT: 1.2 % (ref 0.4–3.1)
Retic Count, Absolute: 42.6 10*3/uL (ref 19.0–186.0)

## 2017-06-19 LAB — MRSA PCR SCREENING: MRSA BY PCR: NEGATIVE

## 2017-06-19 LAB — CBC
HCT: 34.4 % — ABNORMAL LOW (ref 39.0–52.0)
HEMOGLOBIN: 10.9 g/dL — AB (ref 13.0–17.0)
MCH: 30.7 pg (ref 26.0–34.0)
MCHC: 31.7 g/dL (ref 30.0–36.0)
MCV: 96.9 fL (ref 78.0–100.0)
Platelets: 161 10*3/uL (ref 150–400)
RBC: 3.55 MIL/uL — ABNORMAL LOW (ref 4.22–5.81)
RDW: 14.9 % (ref 11.5–15.5)
WBC: 10.3 10*3/uL (ref 4.0–10.5)

## 2017-06-19 LAB — I-STAT CG4 LACTIC ACID, ED: Lactic Acid, Venous: 1.23 mmol/L (ref 0.5–1.9)

## 2017-06-19 MED ORDER — ENOXAPARIN SODIUM 40 MG/0.4ML ~~LOC~~ SOLN
40.0000 mg | SUBCUTANEOUS | Status: DC
  Administered 2017-06-19 – 2017-06-21 (×3): 40 mg via SUBCUTANEOUS
  Filled 2017-06-19 (×3): qty 0.4

## 2017-06-19 MED ORDER — ACETAMINOPHEN 325 MG PO TABS
650.0000 mg | ORAL_TABLET | Freq: Four times a day (QID) | ORAL | Status: DC | PRN
  Administered 2017-06-19: 650 mg via ORAL
  Filled 2017-06-19: qty 2

## 2017-06-19 MED ORDER — BUPROPION HCL ER (SR) 150 MG PO TB12
150.0000 mg | ORAL_TABLET | Freq: Two times a day (BID) | ORAL | Status: DC
  Administered 2017-06-19 – 2017-06-21 (×5): 150 mg via ORAL
  Filled 2017-06-19 (×5): qty 1

## 2017-06-19 MED ORDER — ALBUTEROL SULFATE (2.5 MG/3ML) 0.083% IN NEBU
2.5000 mg | INHALATION_SOLUTION | Freq: Four times a day (QID) | RESPIRATORY_TRACT | Status: DC | PRN
  Filled 2017-06-19: qty 3

## 2017-06-19 MED ORDER — IPRATROPIUM BROMIDE 0.02 % IN SOLN
0.5000 mg | Freq: Four times a day (QID) | RESPIRATORY_TRACT | Status: DC
  Administered 2017-06-19 – 2017-06-20 (×6): 0.5 mg via RESPIRATORY_TRACT
  Filled 2017-06-19 (×6): qty 2.5

## 2017-06-19 MED ORDER — FLUTICASONE PROPIONATE 50 MCG/ACT NA SUSP
1.0000 | Freq: Two times a day (BID) | NASAL | Status: DC
  Administered 2017-06-19 – 2017-06-21 (×5): 1 via NASAL
  Filled 2017-06-19 (×3): qty 16

## 2017-06-19 MED ORDER — MEMANTINE HCL 10 MG PO TABS
10.0000 mg | ORAL_TABLET | Freq: Two times a day (BID) | ORAL | Status: DC
  Administered 2017-06-19 – 2017-06-21 (×5): 10 mg via ORAL
  Filled 2017-06-19 (×5): qty 1

## 2017-06-19 MED ORDER — MOMETASONE FURO-FORMOTEROL FUM 200-5 MCG/ACT IN AERO
2.0000 | INHALATION_SPRAY | Freq: Two times a day (BID) | RESPIRATORY_TRACT | Status: DC
  Administered 2017-06-19 – 2017-06-21 (×5): 2 via RESPIRATORY_TRACT
  Filled 2017-06-19: qty 8.8

## 2017-06-19 MED ORDER — SERTRALINE HCL 50 MG PO TABS
50.0000 mg | ORAL_TABLET | Freq: Every day | ORAL | Status: DC
  Administered 2017-06-19 – 2017-06-21 (×3): 50 mg via ORAL
  Filled 2017-06-19 (×3): qty 1

## 2017-06-19 MED ORDER — ONDANSETRON HCL 4 MG/2ML IJ SOLN: 4.0000 mg | Freq: Four times a day (QID) | INTRAMUSCULAR | Status: DC | PRN

## 2017-06-19 MED ORDER — SODIUM CHLORIDE 0.9 % IV SOLN
2.0000 g | Freq: Once | INTRAVENOUS | Status: AC
  Administered 2017-06-19: 2 g via INTRAVENOUS
  Filled 2017-06-19: qty 2

## 2017-06-19 MED ORDER — PRAMIPEXOLE DIHYDROCHLORIDE 0.25 MG PO TABS
0.1250 mg | ORAL_TABLET | Freq: Every day | ORAL | Status: DC
  Administered 2017-06-19 – 2017-06-20 (×2): 0.125 mg via ORAL
  Filled 2017-06-19 (×2): qty 0.5
  Filled 2017-06-19: qty 1

## 2017-06-19 MED ORDER — ONDANSETRON HCL 4 MG PO TABS: 4.0000 mg | ORAL_TABLET | Freq: Four times a day (QID) | ORAL | Status: DC | PRN

## 2017-06-19 MED ORDER — ASPIRIN EC 81 MG PO TBEC
81.0000 mg | DELAYED_RELEASE_TABLET | Freq: Every day | ORAL | Status: DC
  Administered 2017-06-19 – 2017-06-21 (×3): 81 mg via ORAL
  Filled 2017-06-19 (×3): qty 1

## 2017-06-19 MED ORDER — TRAZODONE HCL 50 MG PO TABS
200.0000 mg | ORAL_TABLET | Freq: Every day | ORAL | Status: DC
  Administered 2017-06-19 – 2017-06-20 (×2): 200 mg via ORAL
  Filled 2017-06-19 (×2): qty 4

## 2017-06-19 MED ORDER — VANCOMYCIN HCL 10 G IV SOLR
1500.0000 mg | Freq: Once | INTRAVENOUS | Status: AC
  Administered 2017-06-19: 1500 mg via INTRAVENOUS
  Filled 2017-06-19 (×2): qty 1500

## 2017-06-19 MED ORDER — ALBUTEROL SULFATE HFA 108 (90 BASE) MCG/ACT IN AERS: 2.0000 | INHALATION_SPRAY | RESPIRATORY_TRACT | Status: DC | PRN

## 2017-06-19 MED ORDER — VANCOMYCIN HCL IN DEXTROSE 1-5 GM/200ML-% IV SOLN
1000.0000 mg | INTRAVENOUS | Status: DC
  Administered 2017-06-20: 1000 mg via INTRAVENOUS
  Filled 2017-06-19: qty 200

## 2017-06-19 MED ORDER — LEVOFLOXACIN IN D5W 750 MG/150ML IV SOLN
750.0000 mg | Freq: Once | INTRAVENOUS | Status: AC
  Administered 2017-06-19: 750 mg via INTRAVENOUS
  Filled 2017-06-19: qty 150

## 2017-06-19 MED ORDER — GABAPENTIN 400 MG PO CAPS
400.0000 mg | ORAL_CAPSULE | Freq: Three times a day (TID) | ORAL | Status: DC
  Administered 2017-06-19 – 2017-06-21 (×7): 400 mg via ORAL
  Filled 2017-06-19 (×7): qty 1

## 2017-06-19 MED ORDER — PANTOPRAZOLE SODIUM 40 MG PO TBEC
40.0000 mg | DELAYED_RELEASE_TABLET | Freq: Every day | ORAL | Status: DC
Start: 2017-06-19 — End: 2017-06-21
  Administered 2017-06-19 – 2017-06-21 (×3): 40 mg via ORAL
  Filled 2017-06-19 (×3): qty 1

## 2017-06-19 MED ORDER — BACLOFEN 10 MG PO TABS
10.0000 mg | ORAL_TABLET | Freq: Two times a day (BID) | ORAL | Status: DC
  Administered 2017-06-19 – 2017-06-21 (×5): 10 mg via ORAL
  Filled 2017-06-19 (×5): qty 1

## 2017-06-19 MED ORDER — SODIUM CHLORIDE 0.9 % IV SOLN
2.0000 g | Freq: Three times a day (TID) | INTRAVENOUS | Status: DC
  Administered 2017-06-19 – 2017-06-20 (×4): 2 g via INTRAVENOUS
  Filled 2017-06-19 (×5): qty 2

## 2017-06-19 MED ORDER — SENNOSIDES-DOCUSATE SODIUM 8.6-50 MG PO TABS: 1.0000 | ORAL_TABLET | Freq: Every day | ORAL | Status: DC | PRN

## 2017-06-19 MED ORDER — TAMSULOSIN HCL 0.4 MG PO CAPS
0.4000 mg | ORAL_CAPSULE | Freq: Every day | ORAL | Status: DC
  Administered 2017-06-19 – 2017-06-20 (×2): 0.4 mg via ORAL
  Filled 2017-06-19 (×2): qty 1

## 2017-06-19 MED ORDER — ACETAMINOPHEN 650 MG RE SUPP: 650.0000 mg | Freq: Four times a day (QID) | RECTAL | Status: DC | PRN

## 2017-06-19 MED ORDER — VANCOMYCIN HCL IN DEXTROSE 1-5 GM/200ML-% IV SOLN: 1000.0000 mg | Freq: Once | INTRAVENOUS | Status: DC

## 2017-06-19 MED ORDER — VITAMIN B-12 1000 MCG PO TABS
1000.0000 ug | ORAL_TABLET | Freq: Every day | ORAL | Status: DC
  Administered 2017-06-19 – 2017-06-21 (×3): 1000 ug via ORAL
  Filled 2017-06-19 (×3): qty 1

## 2017-06-19 MED ORDER — TRAMADOL HCL 50 MG PO TABS
50.0000 mg | ORAL_TABLET | Freq: Two times a day (BID) | ORAL | Status: DC
  Administered 2017-06-19 – 2017-06-21 (×5): 50 mg via ORAL
  Filled 2017-06-19 (×5): qty 1

## 2017-06-19 NOTE — ED Triage Notes (Signed)
Pt is a resident of [facility] who was sent to er after pt was noticed to have a fever and was not able to sit up, fever at nursing home was 101.4 by nursing staff and was given a gram of tylenol. Temp upon arrival to er. Pt is confused upon arrival to er. Unable to state where he stays at but is able to state that he is in a hospital and his name and birthday, pt c/o left hip pain.

## 2017-06-19 NOTE — Progress Notes (Signed)
Patient seen and examined. Admitted after midnight secondary to increase weakness and fever. Patient is a permanent resident at [facility]. Underlying dementia and CXR findings suggesting new opacification in his RLL. Started on broad spectrum antibiotics for HCAP and checking influenza by PCR. Please refer to H&P written by Dr. Hal Hope for further info/details on admission. Hemodynamically stable and in no distress.   Plan: -PRN nebulizer -Flutter valve -continue IV antibiotics  -check influenza panel  -supportive care and PRN antipyretics  -follow clinical response   Barton Dubois MD [phone]

## 2017-06-19 NOTE — ED Provider Notes (Signed)
[facility] EMERGENCY DEPARTMENT Provider Note   CSN: [id number] Arrival date & time: 06/18/17  2359     History   Chief Complaint Chief Complaint  Patient presents with  . Fever    HPI Anthony [name] is a 77 y.o. male.  The history is provided by the nursing home.  Fever    He has history of COPD, chronic kidney disease, pyelonephritis, pneumonia, dementia and was sent from his skilled nursing center because of fever to 101.4.  He was noted to be weaker than normal and was not able to sit up unaided, which he is usually able to do.  He was given acetaminophen and transferred to the ED.  Triage nurse noted draining area on scalp.  Past Medical History:  Diagnosis Date  . Acute pyelonephritis 12/23/2014  . Asthma   . BPH (benign prostatic hyperplasia)   . Chronic back pain   . Chronic hip pain   . COPD (chronic obstructive pulmonary disease) ([place])   . Dementia   . Depression   . GERD (gastroesophageal reflux disease)   . Kidney stone   . Neuropathy   . Pulmonary nodules 12/25/2014  . Restless leg syndrome     Patient Active Problem List   Diagnosis Date Noted  . S/p left hip fracture   . HCAP (healthcare-associated pneumonia) 10/31/2016  . CKD (chronic kidney disease) 10/31/2016  . COPD (chronic obstructive pulmonary disease) ([place]) 10/31/2016  . Hyponatremia 12/25/2014  . Hypokalemia 12/25/2014  . Normocytic anemia 12/25/2014  . Pulmonary nodules 12/25/2014  . Tobacco abuse 12/25/2014  . ARF (acute renal failure) ([place]) 12/24/2014  . Acute pyelonephritis 12/23/2014    Past Surgical History:  Procedure Laterality Date  . ABDOMINAL SURGERY     heria  . EYE SURGERY          Home Medications    Prior to Admission medications   Medication Sig Start Date End Date Taking? Authorizing Provider  acetaminophen (TYLENOL) 500 MG tablet Take 500 mg every 8 (eight) hours as needed by mouth for mild pain.    [provider]  albuterol (PROVENTIL HFA;VENTOLIN  HFA) 108 (90 BASE) MCG/ACT inhaler Inhale 2 puffs into the lungs every 4 (four) hours as needed for wheezing or shortness of breath.    [provider]  albuterol (PROVENTIL) (2.5 MG/3ML) 0.083% nebulizer solution Take 2.5 mg every 6 (six) hours as needed by nebulization for wheezing or shortness of breath.    [provider]  aspirin EC 81 MG tablet Take 81 mg by mouth daily.    [provider]  baclofen (LIORESAL) 10 MG tablet Take 10 mg by mouth 2 (two) times daily.    [provider]  buPROPion (WELLBUTRIN SR) 150 MG 12 hr tablet Take 150 mg by mouth 2 (two) times daily.    [provider]  cyanocobalamin 1000 MCG tablet Take 1,000 mcg by mouth daily.    [provider]  fluticasone (FLONASE) 50 MCG/ACT nasal spray Place 1 spray 2 (two) times daily into both nostrils.     [provider]  Fluticasone-Salmeterol (ADVAIR) 250-50 MCG/DOSE AEPB Inhale 1 puff 2 (two) times daily into the lungs.    [provider]  gabapentin (NEURONTIN) 400 MG capsule Take 400 mg by mouth 3 (three) times daily.    [provider]  ipratropium (ATROVENT) 0.02 % nebulizer solution Take 0.5 mg 4 (four) times daily by nebulization.    [provider]  memantine (NAMENDA) 10 MG  tablet Take 10 mg by mouth 2 (two) times daily.    [provider]  omeprazole (PRILOSEC) 20 MG capsule Take 20 mg by mouth daily.    [provider]  pramipexole (MIRAPEX) 0.125 MG tablet Take 0.125 mg by mouth at bedtime.    [provider]  sennosides-docusate sodium (SENOKOT-S) 8.6-50 MG tablet Take 1 tablet daily as needed by mouth for constipation.    [provider]  sertraline (ZOLOFT) 50 MG tablet Take 50 mg by mouth daily.    [provider]  tamsulosin (FLOMAX) 0.4 MG CAPS capsule Take 0.4 mg by mouth daily as needed.     [provider]  traMADol (ULTRAM) 50 MG tablet Take 50 mg 2 (two) times  daily by mouth.    [provider]  traZODone (DESYREL) 100 MG tablet Take 200 mg by mouth at bedtime.    [provider]    Family History No family history on file.  Social History Social History   Tobacco Use  . Smoking status: Current Every Day Smoker    Packs/day: 0.50    Types: Cigarettes  . Smokeless tobacco: Never Used  Substance Use Topics  . Alcohol use: No  . Drug use: No     Allergies   Bee venom; Penicillins; and Strawberry extract   Review of Systems Review of Systems  Constitutional: Positive for fever.  All other systems reviewed and are negative.    Physical Exam Updated Vital Signs BP (!) 106/56   Pulse 86   Temp 99.1 F (37.3 C) (Oral)   Resp 18   Ht 6\' 2"  (1.88 m)   Wt 74.8 kg (165 lb)   SpO2 92%   BMI 21.18 kg/m   Physical Exam  Nursing note and vitals reviewed.  77 year old male, resting comfortably and in no acute distress. Vital signs are normal. Oxygen saturation is 92%, which is normal. Head is normocephalic and atraumatic. PERRLA, EOMI. Oropharynx is clear.  Ulceration on the occipital area with slight erythema of the margins and mild tenderness.  Ulceration is 3 cm x 2 cm.  There is very slight drainage without any odor. Neck is nontender and supple without adenopathy or JVD. Back is nontender and there is no CVA tenderness. Lungs are clear without rales, wheezes, or rhonchi. Chest is nontender. Heart has regular rate and rhythm without murmur. Abdomen is soft, flat, nontender without masses or hepatosplenomegaly and peristalsis is normoactive. Extremities have no cyanosis or edema, full range of motion is present. Skin is warm and dry without rash. Neurologic: Awake and alert and oriented to person and place but not time, cranial nerves are intact, there are no motor or sensory deficits.  ED Treatments / Results  Labs (all labs ordered are listed, but only abnormal results are displayed) Labs Reviewed    COMPREHENSIVE METABOLIC PANEL - Abnormal; Notable for the following components:      Result Value   Sodium 133 (*)    Glucose, Bld 128 (*)    BUN 39 (*)    Creatinine, Ser 1.80 (*)    Calcium 8.3 (*)    Albumin 3.2 (*)    ALT 10 (*)    GFR calc non Af Amer 34 (*)    GFR calc Af Amer 40 (*)    All other components within normal limits  CBC WITH DIFFERENTIAL/PLATELET - Abnormal; Notable for the following components:   WBC 14.4 (*)    RBC 3.56 (*)  Hemoglobin 10.7 (*)    HCT 34.0 (*)    Neutro Abs 12.1 (*)    Monocytes Absolute 1.4 (*)    All other components within normal limits  CULTURE, BLOOD (ROUTINE X 2)  CULTURE, BLOOD (ROUTINE X 2)  URINE CULTURE  AEROBIC CULTURE (SUPERFICIAL SPECIMEN)  URINALYSIS, ROUTINE W REFLEX MICROSCOPIC  I-STAT CG4 LACTIC ACID, ED  I-STAT CG4 LACTIC ACID, ED    EKG EKG Interpretation  Date/Time:  Sunday June 19 2017 00:07:40 EDT Ventricular Rate:  88 PR Interval:    QRS Duration: 124 QT Interval:  389 QTC Calculation: 471 R Axis:   -61 Text Interpretation:  Sinus rhythm Left bundle branch block When compared with ECG of 06/10/2017, No significant change was found Confirmed by Delora Fuel ([id number]) on 06/19/2017 12:10:10 AM   Radiology Dg Chest Port 1 View  Result Date: 06/19/2017 CLINICAL DATA:  Fever. EXAM: PORTABLE CHEST 1 VIEW COMPARISON:  Right humerus radiographs 12/24/2016, CXR 12/24/2016 FINDINGS: Top-normal size heart with aortic atherosclerosis. Patchy airspace opacity at the right lung base consistent with pneumonia. No overt edema. Redemonstration of mixed osteolytic and sclerotic lesion in the proximal diaphysis of the right humerus without pathologic fracture. Findings may represent a chronic cartilaginous lesion. If the patient has pain referable to the right humerus, non emergent MRI may help for further correlation. IMPRESSION: Right lower lobe pulmonary consolidation consistent with pneumonia. Stable mixed osteolytic and  sclerotic lesion of the proximal right humeral diaphysis. Electronically Signed   By: Ashley Royalty M.D.   On: 06/19/2017 01:25    Procedures Procedures   Medications Ordered in ED Medications  levofloxacin (LEVAQUIN) IVPB 750 mg (750 mg Intravenous New Bag/Given 06/19/17 0112)  aztreonam (AZACTAM) 2 g in sodium chloride 0.9 % 100 mL IVPB (2 g Intravenous New Bag/Given 06/19/17 0219)  vancomycin (VANCOCIN) 1,500 mg in sodium chloride 0.9 % 500 mL IVPB (has no administration in time range)     Initial Impression / Assessment and Plan / ED Course  I have reviewed the triage vital signs and the nursing notes.  Pertinent labs & imaging results that were available during my care of the patient were reviewed by me and considered in my medical decision making (see chart for details).  Fever without any obvious source.  Old records are reviewed, and he does have a prior hospitalization for pneumonia and does not have known history of having had pyelonephritis.  Lesion on scalp does not seem to be a likely source of fever, but cultures will be obtained.  He does not appear overtly septic, but blood pressure is significantly lower than on previous ED visits, so sepsis labs are ordered.  Initial antibiotics given for sepsis of undetermined source.  Chest x-ray shows findings consistent with pneumonia.  Lactic acid level is normal.  Laboratory workup shows renal insufficiency which is not changed from baseline.  Mild anemia is present which is also unchanged from baseline.  Case is discussed with Dr. Hal Hope of [facility], who agrees to admit the patient.  Final Clinical Impressions(s) / ED Diagnoses   Final diagnoses:  HCAP (healthcare-associated pneumonia)  Renal insufficiency  Normochromic normocytic anemia  Open wound of scalp, unspecified open wound type, initial encounter    ED Discharge Orders    None       Delora Fuel, MD 01/74/94 0231

## 2017-06-19 NOTE — Progress Notes (Signed)
ANTIBIOTIC CONSULT NOTE-Preliminary  Pharmacy Consult for Vancomycin, Aztreonam, and Levofloxacin Indication: Sepsis  Patient Measurements: Height: 6\' 2"  (188 cm) Weight: 165 lb (74.8 kg) IBW/kg (Calculated) : 82.2  Vital Signs: Temp: 99.1 F (37.3 C) (04/07 0009) Temp Source: Oral (04/07 0009) BP: 100/54 (04/07 0101) Pulse Rate: 81 (04/07 0101)  Labs: Recent Labs    06/19/17 0021  WBC 14.4*  HGB 10.7*  PLT 181  CREATININE 1.80*    Estimated Creatinine Clearance: 35.8 mL/min (A) (by C-G formula based on SCr of 1.8 mg/dL (H)).  No results for input(s): VANCOTROUGH, VANCOPEAK, VANCORANDOM, GENTTROUGH, GENTPEAK, GENTRANDOM, TOBRATROUGH, TOBRAPEAK, TOBRARND, AMIKACINPEAK, AMIKACINTROU, AMIKACIN in the last 72 hours.   Microbiology: No results found for this or any previous visit (from the past 720 hour(s)).  Medical History: Past Medical History:  Diagnosis Date  . Acute pyelonephritis 12/23/2014  . Asthma   . BPH (benign prostatic hyperplasia)   . Chronic back pain   . Chronic hip pain   . COPD (chronic obstructive pulmonary disease) ([place])   . Dementia   . Depression   . GERD (gastroesophageal reflux disease)   . Kidney stone   . Neuropathy   . Pulmonary nodules 12/25/2014  . Restless leg syndrome     Medications:  Azactam 2 Gm IV x 1 ED dose Levofloxacin 750 mg IV x 1 ED dose  Assessment: 77 yo male SNF resident sent to the ED due to elevated temperature with draining ulcer on the back of his head. Pharmacy has been consulted for dosing vancomycin, levofloxacin, and aztreonam for sepsis.  Goal of Therapy:  Vancomycin troughs 15-20 mcg/ml Eradicate infection  Plan:  Preliminary review of pertinent patient information completed.  Protocol will be initiated with a loading dose of Vancomycin 1500 mg IV.  Forestine Na clinical pharmacist will complete review during morning rounds to assess patient and finalize treatment regimen if needed.  Norberto Sorenson,  [facility] 06/19/2017,1:20 AM

## 2017-06-19 NOTE — H&P (Signed)
History and Physical    Kennis Wissmann FWY:[id number] DOB: April 07, 1940 DOA: 06/18/2017  PCP: Rosita Fire, MD  Patient coming from: Skilled nursing facility.  Chief Complaint: Fever.  HPI: Stephone Gum is a 77 y.o. male with history of COPD, dementia, chronic kidney disease stage III, anemia was brought to the ER from skilled nursing facility after patient was found to be febrile with temperatures around 101 F.  There was no report of any nausea vomiting diarrhea or any chest pain or shortness of breath.  Patient was found to be weaker than his usual state.  ED Course: In the ER patient was mildly febrile.  Chest x-ray shows opacity concerning for pneumonia.  Patient's scalp also has a ulceration on the back concerning for decubitus ulcer.  Mild discharge.  Anemia has worsened from recent but has been low previously also compared to August 2018.  During my exam patient has been coughing.  Patient started on empiric antibiotics for pneumonia and admitted.  Review of Systems: As per HPI, rest all negative.   Past Medical History:  Diagnosis Date  . Acute pyelonephritis 12/23/2014  . Asthma   . BPH (benign prostatic hyperplasia)   . Chronic back pain   . Chronic hip pain   . COPD (chronic obstructive pulmonary disease) ([place])   . Dementia   . Depression   . GERD (gastroesophageal reflux disease)   . Kidney stone   . Neuropathy   . Pulmonary nodules 12/25/2014  . Restless leg syndrome     Past Surgical History:  Procedure Laterality Date  . ABDOMINAL SURGERY     heria  . EYE SURGERY       reports that he has been smoking cigarettes.  He has been smoking about 0.50 packs per day. He has never used smokeless tobacco. He reports that he does not drink alcohol or use drugs.  Allergies - Penicillin.  Family History  Family history unknown: Yes    Prior to Admission medications   Medication Sig Start Date End Date Taking? Authorizing Provider  acetaminophen (TYLENOL) 500 MG  tablet Take 500 mg every 8 (eight) hours as needed by mouth for mild pain.    [provider]  albuterol (PROVENTIL HFA;VENTOLIN HFA) 108 (90 BASE) MCG/ACT inhaler Inhale 2 puffs into the lungs every 4 (four) hours as needed for wheezing or shortness of breath.    [provider]  albuterol (PROVENTIL) (2.5 MG/3ML) 0.083% nebulizer solution Take 2.5 mg every 6 (six) hours as needed by nebulization for wheezing or shortness of breath.    [provider]  aspirin EC 81 MG tablet Take 81 mg by mouth daily.    [provider]  baclofen (LIORESAL) 10 MG tablet Take 10 mg by mouth 2 (two) times daily.    [provider]  buPROPion (WELLBUTRIN SR) 150 MG 12 hr tablet Take 150 mg by mouth 2 (two) times daily.    [provider]  cyanocobalamin 1000 MCG tablet Take 1,000 mcg by mouth daily.    [provider]  fluticasone (FLONASE) 50 MCG/ACT nasal spray Place 1 spray 2 (two) times daily into both nostrils.     [provider]  Fluticasone-Salmeterol (ADVAIR) 250-50 MCG/DOSE AEPB Inhale 1 puff 2 (two) times daily into the lungs.    [provider]  gabapentin (NEURONTIN) 400 MG capsule Take 400 mg by mouth 3 (three) times daily.    [provider]  ipratropium (ATROVENT) 0.02 % nebulizer solution Take 0.5  mg 4 (four) times daily by nebulization.    [provider]  memantine (NAMENDA) 10 MG tablet Take 10 mg by mouth 2 (two) times daily.    [provider]  omeprazole (PRILOSEC) 20 MG capsule Take 20 mg by mouth daily.    [provider]  pramipexole (MIRAPEX) 0.125 MG tablet Take 0.125 mg by mouth at bedtime.    [provider]  sennosides-docusate sodium (SENOKOT-S) 8.6-50 MG tablet Take 1 tablet daily as needed by mouth for constipation.    [provider]  sertraline (ZOLOFT) 50 MG tablet Take 50 mg by mouth daily.    [provider]  tamsulosin (FLOMAX) 0.4 MG  CAPS capsule Take 0.4 mg by mouth daily as needed.     [provider]  traMADol (ULTRAM) 50 MG tablet Take 50 mg 2 (two) times daily by mouth.    [provider]  traZODone (DESYREL) 100 MG tablet Take 200 mg by mouth at bedtime.    [provider]    Physical Exam: Vitals:   06/19/17 0030 06/19/17 0101 06/19/17 0130 06/19/17 0200  BP: 105/64 (!) 100/54 (!) 106/51 109/70  Pulse: 84 81 80 78  Resp: 17 16 13 17   Temp:      TempSrc:      SpO2: 90% (!) 89% 91% 93%  Weight:      Height:          Constitutional: Moderately built and nourished. Vitals:   06/19/17 0030 06/19/17 0101 06/19/17 0130 06/19/17 0200  BP: 105/64 (!) 100/54 (!) 106/51 109/70  Pulse: 84 81 80 78  Resp: 17 16 13 17   Temp:      TempSrc:      SpO2: 90% (!) 89% 91% 93%  Weight:      Height:       Eyes: Anicteric no pallor. ENMT: No discharge from the ears eyes nose or mouth. Neck: No mass felt.  No neck rigidity.  No JVD appreciated. Respiratory: No rhonchi or crepitations. Cardiovascular: S1-S2 heard no murmurs appreciated. Abdomen: Soft nontender bowel sounds present. Musculoskeletal: No edema.  No joint effusion. Skin: Ulceration on the scalp posterior aspect. Neurologic: Alert awake oriented to his name.  Moves all extremities. Psychiatric: Has dementia.   Labs on Admission: I have personally reviewed following labs and imaging studies  CBC: Recent Labs  Lab 06/19/17 0021  WBC 14.4*  NEUTROABS 12.1*  HGB 10.7*  HCT 34.0*  MCV 95.5  PLT 967   Basic Metabolic Panel: Recent Labs  Lab 06/19/17 0021  NA 133*  K 4.1  CL 103  CO2 23  GLUCOSE 128*  BUN 39*  CREATININE 1.80*  CALCIUM 8.3*   GFR: Estimated Creatinine Clearance: 35.8 mL/min (A) (by C-G formula based on SCr of 1.8 mg/dL (H)). Liver Function Tests: Recent Labs  Lab 06/19/17 0021  AST 16  ALT 10*  ALKPHOS 58  BILITOT 0.7  PROT 6.6  ALBUMIN 3.2*   No results for input(s): LIPASE, AMYLASE  in the last 168 hours. No results for input(s): AMMONIA in the last 168 hours. Coagulation Profile: No results for input(s): INR, PROTIME in the last 168 hours. Cardiac Enzymes: No results for input(s): CKTOTAL, CKMB, CKMBINDEX, TROPONINI in the last 168 hours. BNP (last 3 results) No results for input(s): PROBNP in the last 8760 hours. HbA1C: No results for input(s): HGBA1C in the last 72 hours. CBG: No results for input(s): GLUCAP in the last 168 hours. Lipid Profile: No results  for input(s): CHOL, HDL, LDLCALC, TRIG, CHOLHDL, LDLDIRECT in the last 72 hours. Thyroid Function Tests: No results for input(s): TSH, T4TOTAL, FREET4, T3FREE, THYROIDAB in the last 72 hours. Anemia Panel: No results for input(s): VITAMINB12, FOLATE, FERRITIN, TIBC, IRON, RETICCTPCT in the last 72 hours. Urine analysis:    Component Value Date/Time   COLORURINE STRAW (A) 06/10/2017 0154   APPEARANCEUR CLEAR 06/10/2017 0154   LABSPEC 1.010 06/10/2017 0154   PHURINE 6.0 06/10/2017 0154   GLUCOSEU NEGATIVE 06/10/2017 0154   HGBUR NEGATIVE 06/10/2017 0154   BILIRUBINUR NEGATIVE 06/10/2017 0154   KETONESUR NEGATIVE 06/10/2017 0154   PROTEINUR NEGATIVE 06/10/2017 0154   UROBILINOGEN 1.0 12/23/2014 1853   NITRITE NEGATIVE 06/10/2017 0154   LEUKOCYTESUR NEGATIVE 06/10/2017 0154   Sepsis Labs: @LABRCNTIP (procalcitonin:4,lacticidven:4) ) Recent Results (from the past 240 hour(s))  Blood Culture (routine x 2)     Status: None (Preliminary result)   Collection Time: 06/19/17  1:05 AM  Result Value Ref Range Status   Specimen Description BLOOD LEFT ARM  Final   Special Requests   Final    BOTTLES DRAWN AEROBIC AND ANAEROBIC Blood Culture adequate volume Performed at [facility], [address], [place], [place] [address]    Culture PENDING  Incomplete   Report Status PENDING  Incomplete     Radiological Exams on Admission: Dg Chest Port 1 View  Result Date: 06/19/2017 CLINICAL DATA:  Fever. EXAM:  PORTABLE CHEST 1 VIEW COMPARISON:  Right humerus radiographs 12/24/2016, CXR 12/24/2016 FINDINGS: Top-normal size heart with aortic atherosclerosis. Patchy airspace opacity at the right lung base consistent with pneumonia. No overt edema. Redemonstration of mixed osteolytic and sclerotic lesion in the proximal diaphysis of the right humerus without pathologic fracture. Findings may represent a chronic cartilaginous lesion. If the patient has pain referable to the right humerus, non emergent MRI may help for further correlation. IMPRESSION: Right lower lobe pulmonary consolidation consistent with pneumonia. Stable mixed osteolytic and sclerotic lesion of the proximal right humeral diaphysis. Electronically Signed   By: Ashley Royalty M.D.   On: 06/19/2017 01:25    EKG: Independently reviewed.  Normal sinus rhythm with V.  Assessment/Plan Principal Problem:   HCAP (healthcare-associated pneumonia) Active Problems:   Normochromic normocytic anemia   CKD (chronic kidney disease) stage 3, GFR 30-59 ml/min (HCC)   Dementia   Decubitus ulcer   Open wnd of scalp    1. Healthcare associated pneumonia -patient has been placed on vancomycin and Azactam.  Patient is allergic to penicillin.  Follow cultures. 2. Ulceration of the scalp likely decubitus -I think patient's source of fever could be healthcare associated pneumonia.  We will get wound team consult. 3. Normocytic normochromic anemia likely from chronic kidney disease -will check anemia panel follow CBC.  Mildly worsened from recent but has been so low during August 2018 admission.  On B12 supplements. 4. COPD presently not actively wheezing continue inhalers. 5. Chronic kidney disease stage III creatinine appears to be at baseline. 6. Dementia continue Namenda.   DVT prophylaxis: Lovenox. Code Status: Full code. Family Communication: No family at the bedside. Disposition Plan: Skilled nursing facility. Consults called: Wound team. Admission  status: Inpatient.   Rise Patience MD [facility] Pager [phone].  If 7PM-7AM, please contact night-coverage [email or web address] Password [email or web address]  06/19/2017, 3:41 AM

## 2017-06-19 NOTE — Progress Notes (Signed)
Pharmacy Antibiotic Note  Anthony [name] is a 77 y.o. male admitted on 06/18/2017 with pneumonia.  Pharmacy has been consulted for Vancomycin and Aztreonam dosing.  Plan: Vancomycin 1500mg  loading dose, then 1000mg   IV every 24 hours.  Goal trough 15-20 mcg/mL.  Aztreonam 2gm IV q8h F/u cxs and clinical progress Monitor V/S, labs and levels as indicated  Height: 6\' 2"  (188 cm) Weight: 165 lb (74.8 kg) IBW/kg (Calculated) : 82.2  Temp (24hrs), Avg:99.1 F (37.3 C), Min:99.1 F (37.3 C), Max:99.1 F (37.3 C)  Recent Labs  Lab 06/19/17 0021 06/19/17 0031 06/19/17 0607  WBC 14.4*  --  10.3  CREATININE 1.80*  --  1.70*  LATICACIDVEN  --  1.23  --     Estimated Creatinine Clearance: 37.9 mL/min (A) (by C-G formula based on SCr of 1.7 mg/dL (H)).    Allergies  Allergen Reactions  . Bee Venom Anaphylaxis  . Penicillins Hives    Has patient had a PCN reaction causing immediate rash, facial/tongue/throat swelling, SOB or lightheadedness with hypotension: NO Has patient had a PCN reaction causing severe rash involving mucus membranes or skin necrosis: NO Has patient had a PCN reaction that required hospitalization NoNO Has patient had a PCN reaction occurring within the last 10 years: NoNO If all of the above answers are "NO", then may proceed with Cephalosporin use.   Anthony [name] Extract Rash   Antimicrobials this admission: Vancomycin 4/7 >>  Aztreonam 4/7 >>   Dose adjustments this admission: N/a  Microbiology results: 4/7 BCx: pending 4/7 Wound Cx: pending 4/7 MRSA PCR: negative 4/7 strep pneumo: pending 4/7 influenza panel pending 4/7 legionella panel pending   Thank you for allowing pharmacy to be a part of this patient's care.  Anthony [name], BS Pharm D, BCPS Clinical Pharmacist Pager [phone]  06/19/2017 9:10 AM

## 2017-06-19 NOTE — ED Notes (Signed)
Pt has open area noted to back of head that is draining yellow drainage,

## 2017-06-19 NOTE — Progress Notes (Signed)
All questions on admission list not completed due to incomplete information available from [facility].

## 2017-06-20 DIAGNOSIS — L89893 Pressure ulcer of other site, stage 3: Secondary | ICD-10-CM

## 2017-06-20 DIAGNOSIS — N183 Chronic kidney disease, stage 3 (moderate): Secondary | ICD-10-CM

## 2017-06-20 DIAGNOSIS — F0390 Unspecified dementia without behavioral disturbance: Secondary | ICD-10-CM

## 2017-06-20 DIAGNOSIS — J189 Pneumonia, unspecified organism: Secondary | ICD-10-CM

## 2017-06-20 LAB — FERRITIN: FERRITIN: 365 ng/mL — AB (ref 24–336)

## 2017-06-20 LAB — VITAMIN B12: Vitamin B-12: 391 pg/mL (ref 180–914)

## 2017-06-20 LAB — IRON AND TIBC
Iron: 35 ug/dL — ABNORMAL LOW (ref 45–182)
Saturation Ratios: 18 % (ref 17.9–39.5)
TIBC: 195 ug/dL — AB (ref 250–450)
UIBC: 160 ug/dL

## 2017-06-20 LAB — LEGIONELLA PNEUMOPHILA SEROGP 1 UR AG: L. PNEUMOPHILA SEROGP 1 UR AG: NEGATIVE

## 2017-06-20 LAB — STREP PNEUMONIAE URINARY ANTIGEN: STREP PNEUMO URINARY ANTIGEN: NEGATIVE

## 2017-06-20 MED ORDER — BACITRACIN-NEOMYCIN-POLYMYXIN 400-5-5000 EX OINT
TOPICAL_OINTMENT | Freq: Three times a day (TID) | CUTANEOUS | Status: DC
  Administered 2017-06-20 – 2017-06-21 (×4): 1 via TOPICAL
  Filled 2017-06-20 (×4): qty 1

## 2017-06-20 MED ORDER — IPRATROPIUM BROMIDE 0.02 % IN SOLN
0.5000 mg | Freq: Three times a day (TID) | RESPIRATORY_TRACT | Status: DC
  Administered 2017-06-20 – 2017-06-21 (×2): 0.5 mg via RESPIRATORY_TRACT
  Filled 2017-06-20 (×3): qty 2.5

## 2017-06-20 MED ORDER — LEVOFLOXACIN IN D5W 500 MG/100ML IV SOLN
500.0000 mg | INTRAVENOUS | Status: DC
  Administered 2017-06-20: 500 mg via INTRAVENOUS
  Filled 2017-06-20: qty 100

## 2017-06-20 NOTE — Progress Notes (Signed)
PROGRESS NOTE    Freman Lapage  BHA:[id number] DOB: Sep 23, 1940 DOA: 06/18/2017 PCP: Rosita Fire, MD    Brief Narrative:  77 year old male who presented with fever. He does have the significant past medical history for COPD, dementia, chronic kidney disease stage III, and anemia. Patient was transferred from the nursing facility due to fever and progressive/worsening weakness. On initial physical examination blood pressure 105/64, heart rate 84, respiratory rate 17, oxygenation 89%, lungs no rales or rhonchi, heart S1-S2 present and rhythmic, abdomen soft nontender, no lower extremity edema. Positive ulceration on the posterior scalp. Sodium 133, potassium 4.1, chloride 103, bicarbonate 23, glucose 128, BUN 39, creatinine 1.80, white count 14.4, hemoglobin 10.7, hematocrit 34.0, platelets 181. Urinalysis negative for infection. Chest x-ray with hyperinflation, positive bilateral interstitial infiltrates, with a right lower lobe patchy alveolar infiltrate. EKG sinus rhythm, left axis deviation, left bundle branch block.   Patient was admitted to the hospital with the working diagnosis of right lower lobe pneumonia.     Assessment & Plan:   Principal Problem:   HCAP (healthcare-associated pneumonia) Active Problems:   Normochromic normocytic anemia   CKD (chronic kidney disease) stage 3, GFR 30-59 ml/min (HCC)   Dementia   Decubitus ulcer   Open wnd of scalp   1. Right lower lobe pneumonia. Right lower lobe infiltrate, patient with no significant dyspnea, oxygenating well at 92 to 93%, on room air. He has remained afebrile, will de-escalate antibiotic therapy to levofloxacin in preparation for discharge in the next 24 hours. Cultures have remained no growth.   2. COPD. Will continue bronchodilator therapy, oxymetry monitoring, patient with limited mobility at home, wheelchair. Continue mometasone and formoterol.   3. Chronic kidney disease stage III. Will continue to follow on renal  function and electrolytes.   4. Dementia. Patient able to respond to questions appropriately, with no agitation. Continue bupropion, memantine, gabapentin, sertraline and trazodone.   5. Scalp ulceration. Will add local neosporin, continue wound care, follow wound care team recommendations.    DVT prophylaxis: enoxaparin   Code Status: full code Family Communication: no family at the bedside  Disposition Plan: snf in am, of continue to improve   Consultants:     Procedures:     Antimicrobials:    Levofloxacin    Subjective: Patient is feeling better, positive cough, no wheezing, no chest pain, no nausea or vomiting, at the nursing home uses wheelchair.   Objective: Vitals:   06/19/17 2034 06/19/17 2118 06/19/17 2119 06/20/17 0314  BP: 118/79   118/63  Pulse: 74   77  Resp: 17   18  Temp: 98.3 F (36.8 C)   98.3 F (36.8 C)  TempSrc: Oral   Oral  SpO2: 93% 94% 94% 94%  Weight:      Height:        Intake/Output Summary (Last 24 hours) at 06/20/2017 0828 Last data filed at 06/20/2017 0338 Gross per 24 hour  Intake 1040 ml  Output 2100 ml  Net -1060 ml   Filed Weights   06/19/17 0004  Weight: 74.8 kg (165 lb)    Examination:   General: Not in pain or dyspnea, deconditioned.  Neurology: Awake and alert, non focal  E ENT: mild pallor, no icterus, oral mucosa moist Cardiovascular: No JVD. S1-S2 present, rhythmic, no gallops, rubs, or murmurs. No lower extremity edema. Pulmonary: decreased breath sounds bilaterally at bases, adequate air movement, no wheezing, rhonchi or rales. Gastrointestinal. Abdomen flat, no organomegaly, non tender, no rebound or guarding Skin.  Ulcerated lesion at the scalp, stage 3, round with no erythema. Positive yellowish drainage. Positive tenderness.  Musculoskeletal: no joint deformities     Data Reviewed: I have personally reviewed following labs and imaging studies  CBC: Recent Labs  Lab 06/19/17 0021 06/19/17 0607  WBC  14.4* 10.3  NEUTROABS 12.1*  --   HGB 10.7* 10.9*  HCT 34.0* 34.4*  MCV 95.5 96.9  PLT 181 867   Basic Metabolic Panel: Recent Labs  Lab 06/19/17 0021 06/19/17 0607  NA 133* 140  K 4.1 3.8  CL 103 107  CO2 23 24  GLUCOSE 128* 105*  BUN 39* 36*  CREATININE 1.80* 1.70*  CALCIUM 8.3* 8.3*   GFR: Estimated Creatinine Clearance: 37.9 mL/min (A) (by C-G formula based on SCr of 1.7 mg/dL (H)). Liver Function Tests: Recent Labs  Lab 06/19/17 0021  AST 16  ALT 10*  ALKPHOS 58  BILITOT 0.7  PROT 6.6  ALBUMIN 3.2*   No results for input(s): LIPASE, AMYLASE in the last 168 hours. No results for input(s): AMMONIA in the last 168 hours. Coagulation Profile: No results for input(s): INR, PROTIME in the last 168 hours. Cardiac Enzymes: No results for input(s): CKTOTAL, CKMB, CKMBINDEX, TROPONINI in the last 168 hours. BNP (last 3 results) No results for input(s): PROBNP in the last 8760 hours. HbA1C: No results for input(s): HGBA1C in the last 72 hours. CBG: No results for input(s): GLUCAP in the last 168 hours. Lipid Profile: No results for input(s): CHOL, HDL, LDLCALC, TRIG, CHOLHDL, LDLDIRECT in the last 72 hours. Thyroid Function Tests: No results for input(s): TSH, T4TOTAL, FREET4, T3FREE, THYROIDAB in the last 72 hours. Anemia Panel: Recent Labs    06/19/17 0607  VITAMINB12 391  FOLATE 8.8  FERRITIN 365*  TIBC 195*  IRON 35*  RETICCTPCT 1.2      Radiology Studies: I have reviewed all of the imaging during this hospital visit personally     Scheduled Meds: . aspirin EC  81 mg Oral Daily  . baclofen  10 mg Oral BID  . buPROPion  150 mg Oral BID  . enoxaparin (LOVENOX) injection  40 mg Subcutaneous Q24H  . fluticasone  1 spray Each Nare BID  . gabapentin  400 mg Oral TID  . ipratropium  0.5 mg Nebulization QID  . memantine  10 mg Oral BID  . mometasone-formoterol  2 puff Inhalation BID  . pantoprazole  40 mg Oral Daily  . pramipexole  0.125 mg Oral  QHS  . sertraline  50 mg Oral Daily  . tamsulosin  0.4 mg Oral QPC supper  . traMADol  50 mg Oral BID  . traZODone  200 mg Oral QHS  . cyanocobalamin  1,000 mcg Oral Daily   Continuous Infusions: . aztreonam 2 g (06/20/17 0616)  . vancomycin Stopped (06/20/17 0423)     LOS: 1 day        Mauricio Gerome Apley, MD [facility] Pager [phone]

## 2017-06-20 NOTE — Clinical Social Work Note (Signed)
Clinical Social Work Assessment  Patient Details  Name: Anthony [name] MRN: [id number] Date of Birth: 1940/04/09  Date of referral:  06/20/17               Reason for consult:  Facility Placement                Permission sought to share information with:    Permission granted to share information::     Name::        Agency::  Tammy at [employer]  Relationship::     Contact Information:     Housing/Transportation Living arrangements for the past 2 months:  [place] of Information:  Facility Patient Interpreter Needed:  None Criminal Activity/Legal Involvement Pertinent to Current Situation/Hospitalization:  No - Comment as needed Significant Relationships:  Other(Comment)([place]) Lives with:  Facility Resident Do you feel safe going back to the place where you live?  Yes Need for family participation in patient care:  Yes (Comment)  Care giving concerns:  Facility staff assist with ADLs including bathing, grooming, toileting. Patient can feed himself.    Social Worker assessment / plan:  Patient has been a resident at [facility] since 10/20/16.  His guardian is Netta Cedars with [facility] (Tammy left her a message regarding hospitalization). Patient uses a wheelchair and can help with transfers.  Patient does not walk by himself. Patient is active with [facility] receiving therapy and nursing.  He can return to the facility at discharge.  LCSW advised Tammy that patient is expected to discharge on 06/21/17.  LCSW will follow up with guardian regarding diagnosis, treatment and prognosis.   Employment status:  Retired Forensic scientist:  Information systems manager, Medicaid In [place] PT Recommendations:  Not assessed at this time Information / Referral to community resources:     Patient/Family's Response to care:  Patient's guardian is aware of patient's hospitalization.   Patient/Family's Understanding of and Emotional Response to Diagnosis, Current Treatment, and  Prognosis:  LCSW will follow up with patient's guardian regarding diagnosis, treatment and prognosis.   Emotional Assessment Appearance:  Appears stated age Attitude/Demeanor/Rapport:    Affect (typically observed):  Calm Orientation:  Oriented to Self, Oriented to Place, Oriented to  Time, Oriented to Situation Alcohol / Substance use:  Not Applicable Psych involvement (Current and /or in the community):  No (Comment)  Discharge Needs  Concerns to be addressed:  Other (Comment Required(return to [facility]) Readmission within the last 30 days:  No Current discharge risk:  None Barriers to Discharge:  No Barriers Identified   Ihor Gully, LCSW 06/20/2017, 4:13 PM

## 2017-06-20 NOTE — Consult Note (Signed)
[facility] Nurse wound consult note Reason for Consult: Area on scalp Wound type: According to Butch Penny, the Supervisor at [facility] where the patient resides, Dr. Tarri Glenn at [facility] and [place], removed a lesion on the patient's scalp.  The patient is due to return to on 06/23/17 for follow up evaluation of the area.  Dr. Tarri Glenn had ordered to cleanse the area with normal saline, apply vaseline, cover with gauze, and change daily.  Please continue these orders to promote continuity of care plan.  I will attempt to use remote camera for consult at 1pm today with the assistance of the patient's primary RN. Thank you for the consult.   Val Riles, RN, MSN, CWOCN, CNS-BC, pager [phone]

## 2017-06-20 NOTE — Consult Note (Signed)
[facility] Nurse wound consult note Reason for Consult: Scalp wound Wound type: A dermatologic lesion excision area Consult was performed by camera and with the aide of the Primary RN.  The primary RN will measure and record in the chart the dimensions of the wound.  She stated there is no odor and some drainage. I have entered orders to follow the prescribed wound care provided by Dr. Tarri Glenn:  Cleanse with saline, apply thin layer of vaseline and dry gauze, change daily.  Patient has a follow up appointment with Dr. Tarri Glenn on 06/23/17. Thank you for the consult.  Discussed plan of care with the patient and bedside nurse.  [facility] nurse will not follow at this time.  Please re-consult the [facility] team if needed.  Val Riles, RN, MSN, CWOCN, CNS-BC, pager [phone]

## 2017-06-21 DIAGNOSIS — N289 Disorder of kidney and ureter, unspecified: Secondary | ICD-10-CM

## 2017-06-21 LAB — BASIC METABOLIC PANEL
ANION GAP: 9 (ref 5–15)
BUN: 35 mg/dL — ABNORMAL HIGH (ref 6–20)
CALCIUM: 8.8 mg/dL — AB (ref 8.9–10.3)
CO2: 24 mmol/L (ref 22–32)
CREATININE: 1.7 mg/dL — AB (ref 0.61–1.24)
Chloride: 108 mmol/L (ref 101–111)
GFR, EST AFRICAN AMERICAN: 43 mL/min — AB (ref >60)
GFR, EST NON AFRICAN AMERICAN: 37 mL/min — AB (ref >60)
GLUCOSE: 102 mg/dL — AB (ref 65–99)
Potassium: 4.7 mmol/L (ref 3.5–5.1)
Sodium: 141 mmol/L (ref 135–145)

## 2017-06-21 LAB — CBC WITH DIFFERENTIAL/PLATELET
BASOS PCT: 0 %
Basophils Absolute: 0 10*3/uL (ref 0.0–0.1)
EOS PCT: 9 %
Eosinophils Absolute: 0.5 10*3/uL (ref 0.0–0.7)
HCT: 37.5 % — ABNORMAL LOW (ref 39.0–52.0)
Hemoglobin: 11.6 g/dL — ABNORMAL LOW (ref 13.0–17.0)
Lymphocytes Relative: 11 %
Lymphs Abs: 0.7 10*3/uL (ref 0.7–4.0)
MCH: 30.1 pg (ref 26.0–34.0)
MCHC: 30.9 g/dL (ref 30.0–36.0)
MCV: 97.2 fL (ref 78.0–100.0)
MONO ABS: 0.8 10*3/uL (ref 0.1–1.0)
Monocytes Relative: 13 %
Neutro Abs: 4.2 10*3/uL (ref 1.7–7.7)
Neutrophils Relative %: 67 %
PLATELETS: 164 10*3/uL (ref 150–400)
RBC: 3.86 MIL/uL — ABNORMAL LOW (ref 4.22–5.81)
RDW: 14.8 % (ref 11.5–15.5)
WBC: 6.3 10*3/uL (ref 4.0–10.5)

## 2017-06-21 LAB — AEROBIC CULTURE W GRAM STAIN (SUPERFICIAL SPECIMEN)

## 2017-06-21 LAB — AEROBIC CULTURE  (SUPERFICIAL SPECIMEN)
CULTURE: NORMAL
SPECIAL REQUESTS: NORMAL

## 2017-06-21 LAB — URINE CULTURE
CULTURE: NO GROWTH
Special Requests: NORMAL

## 2017-06-21 MED ORDER — BACITRACIN-NEOMYCIN-POLYMYXIN 400-5-5000 EX OINT: TOPICAL_OINTMENT | Freq: Three times a day (TID) | CUTANEOUS | 0 refills | Status: DC

## 2017-06-21 MED ORDER — LEVOFLOXACIN 750 MG PO TABS: 750.0000 mg | ORAL_TABLET | ORAL | 0 refills | Status: AC

## 2017-06-21 MED ORDER — LEVOFLOXACIN IN D5W 750 MG/150ML IV SOLN: 750.0000 mg | INTRAVENOUS | Status: DC

## 2017-06-21 NOTE — Progress Notes (Signed)
PHARMACY NOTE:  ANTIMICROBIAL RENAL DOSAGE ADJUSTMENT  Current antimicrobial regimen includes a mismatch between antimicrobial dosage and estimated renal function.  As per policy approved by the Pharmacy & Therapeutics and Medical Executive Committees, the antimicrobial dosage will be adjusted accordingly.  Current antimicrobial dosage:  Levaquin 500mg  IV q24hrs  Indication: pneumonia  Renal Function: Estimated Creatinine Clearance: 37.9 mL/min (A) (by C-G formula based on SCr of 1.7 mg/dL (H)).    Antimicrobial dosage has been changed to:  Levaquin 750mg  IV q48hrs  Additional comments:  Monitor cultures and renal fxn  Thank you for allowing pharmacy to be a part of this patient's care.  Ena Dawley, [facility] 06/21/2017 7:45 AM

## 2017-06-21 NOTE — NC FL2 (Addendum)
[place] MEDICAID FL2 LEVEL OF CARE SCREENING TOOL     IDENTIFICATION  Patient Name: Anthony [name] Birthdate: 03/26/1940 Sex: male Admission Date (Current Location): 06/18/2017  [facility] and [place] Number:  [employer] and Address:  [facility] [address], [place]      Provider Number: [id number]  Attending Physician Name and Address:  Tawni Millers,*  Relative Name and Phone Number:       Current Level of Care: Hospital([facility]) Recommended Level of Care: [place] Prior Approval Number:    Date Approved/Denied:   PASRR Number:    Discharge Plan: Other (Comment)([facility])    Current Diagnoses: Patient Active Problem List   Diagnosis Date Noted  . Normochromic normocytic anemia 06/19/2017  . CKD (chronic kidney disease) stage 3, GFR 30-59 ml/min (HCC) 06/19/2017  . Dementia 06/19/2017  . Decubitus ulcer 06/19/2017  . Open wnd of scalp   . S/p left hip fracture   . HCAP (healthcare-associated pneumonia) 10/31/2016  . CKD (chronic kidney disease) 10/31/2016  . COPD (chronic obstructive pulmonary disease) ([place]) 10/31/2016  . Hyponatremia 12/25/2014  . Hypokalemia 12/25/2014  . Normocytic anemia 12/25/2014  . Pulmonary nodules 12/25/2014  . Tobacco abuse 12/25/2014  . ARF (acute renal failure) ([place]) 12/24/2014  . Acute pyelonephritis 12/23/2014    Orientation RESPIRATION BLADDER Height & Weight     Self, Time, Situation, Place  Normal Incontinent Weight: 165 lb (74.8 kg) Height:  6\' 2"  (188 cm)  BEHAVIORAL SYMPTOMS/MOOD NEUROLOGICAL BOWEL NUTRITION STATUS      Incontinent (Heart Healthy)  AMBULATORY STATUS COMMUNICATION OF NEEDS Skin   Total Care(wheelchair ) Verbally Other (Comment)(head)                       Personal Care Assistance Level of Assistance  Dressing, Feeding, Bathing Bathing Assistance: Limited assistance Feeding assistance: Independent Dressing Assistance:  Limited assistance     Functional Limitations Info  Sight, Hearing, Speech Sight Info: Adequate Hearing Info: Adequate Speech Info: Adequate    SPECIAL CARE FACTORS FREQUENCY                       Contractures Contractures Info: Not present    Additional Factors Info  Code Status, Allergies, Psychotropic Code Status Info: Full code Allergies Info: Bee Venom, Penicillins, Strawberry Extract Psychotropic Info: Wellbutrin, Zoloft, Desyrel         Current Medications (06/21/2017):  This is the current hospital active medication list Current Facility-Administered Medications  Medication Dose Route Frequency Provider Last Rate Last Dose  . acetaminophen (TYLENOL) tablet 650 mg  650 mg Oral Q6H PRN Rise Patience, MD   650 mg at 06/19/17 1334   Or  . acetaminophen (TYLENOL) suppository 650 mg  650 mg Rectal Q6H PRN Rise Patience, MD      . albuterol (PROVENTIL) (2.5 MG/3ML) 0.083% nebulizer solution 2.5 mg  2.5 mg Nebulization Q6H PRN Rise Patience, MD      . aspirin EC tablet 81 mg  81 mg Oral Daily Rise Patience, MD   81 mg at 06/21/17 1036  . baclofen (LIORESAL) tablet 10 mg  10 mg Oral BID Rise Patience, MD   10 mg at 06/21/17 1036  . buPROPion (WELLBUTRIN SR) 12 hr tablet 150 mg  150 mg Oral BID Rise Patience, MD   150 mg at 06/21/17 1036  . enoxaparin (LOVENOX) injection 40 mg  40 mg Subcutaneous Q24H Rise Patience, MD   40 mg at 06/21/17 1037  . fluticasone (FLONASE) 50 MCG/ACT nasal spray 1 spray  1 spray Each Nare BID Rise Patience, MD   1 spray at 06/21/17 1038  . gabapentin (NEURONTIN) capsule 400 mg  400 mg Oral TID Rise Patience, MD   400 mg at 06/21/17 1037  . ipratropium (ATROVENT) nebulizer solution 0.5 mg  0.5 mg Nebulization TID Barton Dubois, MD   0.5 mg at 06/21/17 0739  . [START ON 06/22/2017] levofloxacin (LEVAQUIN) IVPB 750 mg  750 mg Intravenous Q48H Arrien, Jimmy Picket, MD      . memantine  [facility]) tablet 10 mg  10 mg Oral BID Rise Patience, MD   10 mg at 06/21/17 1036  . mometasone-formoterol (DULERA) 200-5 MCG/ACT inhaler 2 puff  2 puff Inhalation BID Rise Patience, MD   2 puff at 06/21/17 0744  . neomycin-bacitracin-polymyxin (NEOSPORIN) ointment   Topical TID Arrien, Jimmy Picket, MD   1 application at 46/96/29 1037  . ondansetron (ZOFRAN) tablet 4 mg  4 mg Oral Q6H PRN Rise Patience, MD       Or  . ondansetron [facility]) injection 4 mg  4 mg Intravenous Q6H PRN Rise Patience, MD      . pantoprazole (PROTONIX) EC tablet 40 mg  40 mg Oral Daily Rise Patience, MD   40 mg at 06/21/17 1037  . pramipexole (MIRAPEX) tablet 0.125 mg  0.125 mg Oral QHS Rise Patience, MD   0.125 mg at 06/20/17 2250  . senna-docusate (Senokot-S) tablet 1 tablet  1 tablet Oral Daily PRN Rise Patience, MD      . sertraline (ZOLOFT) tablet 50 mg  50 mg Oral Daily Rise Patience, MD   50 mg at 06/21/17 1036  . tamsulosin (FLOMAX) capsule 0.4 mg  0.4 mg Oral QPC supper Rise Patience, MD   0.4 mg at 06/20/17 1741  . traMADol (ULTRAM) tablet 50 mg  50 mg Oral BID Rise Patience, MD   50 mg at 06/21/17 1037  . traZODone (DESYREL) tablet 200 mg  200 mg Oral QHS Rise Patience, MD   200 mg at 06/20/17 2238  . vitamin B-12 (CYANOCOBALAMIN) tablet 1,000 mcg  1,000 mcg Oral Daily Rise Patience, MD   1,000 mcg at 06/21/17 1037     Discharge Medications: Medication List    STOP taking these medications   sulfamethoxazole-trimethoprim 800-160 MG tablet Commonly known as:  BACTRIM DS,SEPTRA DS     TAKE these medications   acetaminophen 500 MG tablet Commonly known as:  TYLENOL Take 500 mg every 8 (eight) hours as needed by mouth for mild pain.   albuterol (2.5 MG/3ML) 0.083% nebulizer solution Commonly known as:  PROVENTIL Take 2.5 mg every 6 (six) hours as needed by nebulization for wheezing or shortness of breath.    albuterol 108 (90 Base) MCG/ACT inhaler Commonly known as:  PROVENTIL HFA;VENTOLIN HFA Inhale 2 puffs into the lungs every 4 (four) hours as needed for wheezing or shortness of breath.   aspirin EC 81 MG tablet Take 81 mg by mouth daily.   baclofen 10 MG tablet Commonly known as:  LIORESAL Take 10 mg by mouth 2 (two) times daily.   buPROPion 150 MG 12 hr tablet Commonly known as:  WELLBUTRIN SR Take 150 mg by mouth 2 (two) times daily.   CALCIUM CITRATE+D3 PETITES 200-250 MG-UNIT Tabs Generic drug:  Calcium Citrate-Vitamin D Take 1 tablet by mouth daily.   cholecalciferol 1000 units tablet Commonly known as:  VITAMIN D Take 2,000 Units by mouth daily.   cyanocobalamin 1000 MCG tablet Take 1,000 mcg by mouth daily.   fluticasone 50 MCG/ACT nasal spray Commonly known as:  FLONASE Place 1 spray 2 (two) times daily into both nostrils.   Fluticasone-Salmeterol 250-50 MCG/DOSE Aepb Commonly known as:  ADVAIR Inhale 1 puff 2 (two) times daily into the lungs.   gabapentin 400 MG capsule Commonly known as:  NEURONTIN Take 400 mg by mouth 3 (three) times daily.   ipratropium 0.02 % nebulizer solution Commonly known as:  ATROVENT Take 0.5 mg 4 (four) times daily by nebulization.   levofloxacin 750 MG tablet Commonly known as:  LEVAQUIN Take 1 tablet (750 mg total) by mouth every other day for 3 doses.   memantine 10 MG tablet Commonly known as:  NAMENDA Take 10 mg by mouth 2 (two) times daily.   neomycin-bacitracin-polymyxin ointment Commonly known as:  NEOSPORIN Apply topically 3 (three) times daily. Apply to scalp wound.   omeprazole 20 MG capsule Commonly known as:  PRILOSEC Take 20 mg by mouth daily.   pramipexole 0.125 MG tablet Commonly known as:  MIRAPEX Take 0.125 mg by mouth at bedtime.   sertraline 50 MG tablet Commonly known as:  ZOLOFT Take 50 mg by mouth daily.   tamsulosin 0.4 MG Caps capsule Commonly known as:  FLOMAX Take 0.4  mg by mouth daily as needed.   traZODone 100 MG tablet Commonly known as:  DESYREL Take 200 mg by mouth at bedtime.   triamcinolone ointment 0.5 % Commonly known as:  KENALOG Apply 1 application topically 2 (two) times daily. Apply to affected areas of forearms twice daily.        Relevant Imaging Results:  Relevant Lab Results:   Additional Information Patient is on a heart healthy diet which the facility indicates equates to their regular diet.     Ireanna Finlayson, Clydene Pugh, LCSW

## 2017-06-21 NOTE — Discharge Summary (Addendum)
Physician Discharge Summary  Bryer Cozzolino SVX:[id number] DOB: 1941/02/14 DOA: 06/18/2017  PCP: Rosita Fire, MD  Admit date: 06/18/2017 Discharge date: 06/21/2017  Admitted From: Home Disposition:  Home  Recommendations for Outpatient Follow-up and new medication changes:  1. Follow up with PCP in 1- week 2. Continue antibiotic therapy with Levofloxacin q 48 hours for 3 more doses. 3. Continue local wound care and topical neosporin to ulcerated scalp wound.    Home Health: na  Equipment/Devices: na   Discharge Condition: stable CODE STATUS: full  Diet recommendation: Heart healthy  Brief/Interim Summary: 77 year old male who presented with fever. He does have the significant past medical history for COPD, dementia, chronic kidney disease stage III, and anemia. Patient was transferred from the nursing facility due to fever and progressive/worsening weakness. On initial physical examination blood pressure 105/64, heart rate 84, respiratory rate 17, oxygenation 89%, lungs no rales or rhonchi, heart S1-S2 present and rhythmic, abdomen soft nontender, no lower extremity edema. Positive ulceration on the posterior scalp. Sodium 133, potassium 4.1, chloride 103, bicarbonate 23, glucose 128, BUN 39, creatinine 1.80, white count 14.4, hemoglobin 10.7, hematocrit 34.0, platelets 181. Urinalysis negative for infection. Chest x-ray with hyperinflation, positive bilateral interstitial infiltrates, with a right lower lobe patchy alveolar infiltrate. EKG sinus rhythm, left axis deviation, left bundle branch block.   Patient was admitted to the hospital with the working diagnosis of right lower lobe pneumonia.   1.  Right lower lobe pneumonia.  Patient was admitted to the medical ward, he was placed on broad spectrum IV antibiotics therapy, oximetry monitoring and supplemental oxygen per nasal cannula.  He responded well to the medical therapy, his antibiotic therapy was narrowed to levofloxacin with good  toleration.  His oxygen saturation at the time of discharge is 98% on room air.  He has remained afebrile, his cultures have been no growth, discharge white cell count 6.3.   2.  COPD.  Patient was continued on bronchodilator therapy, oximetry monitoring.  Inhaled long-acting beta agonist and inhaled corticosteroids.  No exacerbation during this hospitalization.  3.  Chronic kidney disease stage III.  Kidney function remained stable, nephrotoxic agents were avoided, his discharge creatinine is 1.70, potassium 4.7 and serum bicarbonate 24.  Will need close follow-up as an outpatient of kidney function and electrolytes.  Levofloxacin will be dosed every 48 hours.  4.  Scalp ulceration.  Patient does have a ulcerated wound in the occipital region, size of a quarter, stage III, he received local wound care, topical Neosporin.   5.  Dementia.  No agitation or confusion, patient was continued on bupropion, memantine, gabapentin, sertraline and trazodone.  Discharge Diagnoses:  Principal Problem:   HCAP (healthcare-associated pneumonia) Active Problems:   Normochromic normocytic anemia   CKD (chronic kidney disease) stage 3, GFR 30-59 ml/min (HCC)   Dementia   Decubitus ulcer   Open wnd of scalp    Discharge Instructions Allergies as of 06/21/2017      Reactions   Bee Venom Anaphylaxis   Penicillins Hives   Has patient had a PCN reaction causing immediate rash, facial/tongue/throat swelling, SOB or lightheadedness with hypotension: NO Has patient had a PCN reaction causing severe rash involving mucus membranes or skin necrosis: NO Has patient had a PCN reaction that required hospitalization No\NO Has patient had a PCN reaction occurring within the last 10 years: NO If all of the above answers are "NO", then may proceed with Cephalosporin use.   Strawberry Extract Rash  Medication List    STOP taking these medications   sulfamethoxazole-trimethoprim 800-160 MG tablet Commonly known  as:  BACTRIM DS,SEPTRA DS     TAKE these medications   acetaminophen 500 MG tablet Commonly known as:  TYLENOL Take 500 mg every 8 (eight) hours as needed by mouth for mild pain.   albuterol (2.5 MG/3ML) 0.083% nebulizer solution Commonly known as:  PROVENTIL Take 2.5 mg every 6 (six) hours as needed by nebulization for wheezing or shortness of breath.   albuterol 108 (90 Base) MCG/ACT inhaler Commonly known as:  PROVENTIL HFA;VENTOLIN HFA Inhale 2 puffs into the lungs every 4 (four) hours as needed for wheezing or shortness of breath.   aspirin EC 81 MG tablet Take 81 mg by mouth daily.   baclofen 10 MG tablet Commonly known as:  LIORESAL Take 10 mg by mouth 2 (two) times daily.   buPROPion 150 MG 12 hr tablet Commonly known as:  WELLBUTRIN SR Take 150 mg by mouth 2 (two) times daily.   CALCIUM CITRATE+D3 PETITES 200-250 MG-UNIT Tabs Generic drug:  Calcium Citrate-Vitamin D Take 1 tablet by mouth daily.   cholecalciferol 1000 units tablet Commonly known as:  VITAMIN D Take 2,000 Units by mouth daily.   cyanocobalamin 1000 MCG tablet Take 1,000 mcg by mouth daily.   fluticasone 50 MCG/ACT nasal spray Commonly known as:  FLONASE Place 1 spray 2 (two) times daily into both nostrils.   Fluticasone-Salmeterol 250-50 MCG/DOSE Aepb Commonly known as:  ADVAIR Inhale 1 puff 2 (two) times daily into the lungs.   gabapentin 400 MG capsule Commonly known as:  NEURONTIN Take 400 mg by mouth 3 (three) times daily.   ipratropium 0.02 % nebulizer solution Commonly known as:  ATROVENT Take 0.5 mg 4 (four) times daily by nebulization.   levofloxacin 750 MG tablet Commonly known as:  LEVAQUIN Take 1 tablet (750 mg total) by mouth every other day for 3 doses.   memantine 10 MG tablet Commonly known as:  NAMENDA Take 10 mg by mouth 2 (two) times daily.   neomycin-bacitracin-polymyxin ointment Commonly known as:  NEOSPORIN Apply topically 3 (three) times daily. Apply to  scalp wound.   omeprazole 20 MG capsule Commonly known as:  PRILOSEC Take 20 mg by mouth daily.   pramipexole 0.125 MG tablet Commonly known as:  MIRAPEX Take 0.125 mg by mouth at bedtime.   sertraline 50 MG tablet Commonly known as:  ZOLOFT Take 50 mg by mouth daily.   tamsulosin 0.4 MG Caps capsule Commonly known as:  FLOMAX Take 0.4 mg by mouth daily as needed.   traZODone 100 MG tablet Commonly known as:  DESYREL Take 200 mg by mouth at bedtime.   triamcinolone ointment 0.5 % Commonly known as:  KENALOG Apply 1 application topically 2 (two) times daily. Apply to affected areas of forearms twice daily.        Consultations:     Procedures/Studies: Dg Chest Port 1 View  Result Date: 06/19/2017 CLINICAL DATA:  Fever. EXAM: PORTABLE CHEST 1 VIEW COMPARISON:  Right humerus radiographs 12/24/2016, CXR 12/24/2016 FINDINGS: Top-normal size heart with aortic atherosclerosis. Patchy airspace opacity at the right lung base consistent with pneumonia. No overt edema. Redemonstration of mixed osteolytic and sclerotic lesion in the proximal diaphysis of the right humerus without pathologic fracture. Findings may represent a chronic cartilaginous lesion. If the patient has pain referable to the right humerus, non emergent MRI may help for further correlation. IMPRESSION: Right lower lobe pulmonary consolidation consistent  with pneumonia. Stable mixed osteolytic and sclerotic lesion of the proximal right humeral diaphysis. Electronically Signed   By: Ashley Royalty M.D.   On: 06/19/2017 01:25   Dg Femur Portable 1 View Left  Result Date: 06/10/2017 CLINICAL DATA:  77 year old male and fall.  Left hip fracture. EXAM: LEFT FEMUR PORTABLE 1 VIEW COMPARISON:  Bilateral hip radiograph dated 06/10/2017 and pelvic MRI dated 03/04/2017 FINDINGS: No definite fracture noted on the single provided view of the hip. Foreshortened appearance of the left femoral neck, likely positional. The bones are  osteopenic. Mild arthritic changes of the left knee. IMPRESSION: No definite acute fracture on single view. Electronically Signed   By: Anner Crete M.D.   On: 06/10/2017 06:37   Dg Femur Portable 1 View Right  Result Date: 06/10/2017 CLINICAL DATA:  77 year old male with fall and right hip pain. EXAM: RIGHT FEMUR PORTABLE 1 VIEW COMPARISON:  Right hip radiograph dated 08/21/2016 and MRI dated 03/04/2017 and CT of the right hip dated 01/30/2017 FINDINGS: Evaluation is limited due to osteopenia and patient's body habitus. There is no acute fracture or dislocation on the provided images. Fracture of the superior portion of the greater trochanter of the right femur similar to prior CT. Right femoral intramedullary rod and transcervical screw appears intact. There is mild arthritic changes of the knee. IMPRESSION: No acute/new fracture. Fracture of the superior portion of the greater trochanter appears similar to the prior CT. Electronically Signed   By: Anner Crete M.D.   On: 06/10/2017 06:34   Dg Hips Bilat With Pelvis Min 5 Views  Result Date: 06/10/2017 CLINICAL DATA:  77 year old male status post fall from standing. Right greater than left hip pain. EXAM: DG HIP (WITH OR WITHOUT PELVIS) 5+V BILAT COMPARISON:  Right hip series and CT 01/30/2017 and earlier. FINDINGS: AP supine view of the pelvis. Femoral heads remain normally located. Hip joint spaces are stable. The pelvis appears stable and intact. The proximal left femur appears stable and intact. Sequelae of right femur ORIF re-demonstrated with a stable appearance of the visible right femur intramedullary rod with interlocking hip screw and more distal interlocking cortical screw. The entire intramedullary rod is not included, but the visible hardware appears intact. Unchanged appearance of the right greater trochanter since 2018. No acute right hip fracture identified. IMPRESSION: No acute fracture or dislocation identified about the bilateral  hips or pelvis. Previous right femur ORIF, the visible hardware is stable. Electronically Signed   By: Genevie Ann M.D.   On: 06/10/2017 01:26       Subjective: Patient is feeling well, no nausea or vomiting, no dyspnea or chest pain, intermittent cough. Good appetite.   Discharge Exam: Vitals:   06/21/17 0739 06/21/17 0744  BP:    Pulse:    Resp:    Temp:    SpO2: 98% 98%   Vitals:   06/20/17 2022 06/21/17 0627 06/21/17 0739 06/21/17 0744  BP:  102/63    Pulse:  73    Resp:  16    Temp:  97.9 F (36.6 C)    TempSrc:  Oral    SpO2: 92% 93% 98% 98%  Weight:      Height:        General: Not in pain or dyspnea.  Neurology: Awake and alert, non focal  E ENT: no pallor, no icterus, oral mucosa moist Cardiovascular: No JVD. S1-S2 present, rhythmic, no gallops, rubs, or murmurs. No lower extremity edema. Pulmonary: vesicular breath sounds bilaterally, adequate  air movement, no wheezing, scattered rhonchi, no rales. Gastrointestinal. Abdomen with no organomegaly, non tender, no rebound or guarding Skin. No rashes Musculoskeletal: no joint deformities   The results of significant diagnostics from this hospitalization (including imaging, microbiology, ancillary and laboratory) are listed below for reference.     Microbiology: Recent Results (from the past 240 hour(s))  Blood Culture (routine x 2)     Status: None (Preliminary result)   Collection Time: 06/19/17 12:35 AM  Result Value Ref Range Status   Specimen Description BLOOD RIGHT WRIST  Final   Special Requests   Final    BOTTLES DRAWN AEROBIC AND ANAEROBIC Blood Culture adequate volume   Culture   Final    NO GROWTH 2 DAYS Performed at [facility], [address], [place], [place] [address]    Report Status PENDING  Incomplete  Wound or Superficial Culture     Status: None (Preliminary result)   Collection Time: 06/19/17 12:35 AM  Result Value Ref Range Status   Specimen Description   Final    SCALP Performed at  [facility], [address], [place], [place] [address]    Special Requests   Final    Normal Performed at [facility] [address] [place], [place] [address]    Gram Stain   Final    RARE WBC PRESENT, PREDOMINANTLY PMN RARE GRAM POSITIVE COCCI IN PAIRS RARE GRAM POSITIVE RODS    Culture   Final    CULTURE REINCUBATED FOR BETTER GROWTH Performed at [facility], [place] [address], [place], [place] [address]    Report Status PENDING  Incomplete  Blood Culture (routine x 2)     Status: None (Preliminary result)   Collection Time: 06/19/17  1:05 AM  Result Value Ref Range Status   Specimen Description BLOOD LEFT ARM  Final   Special Requests   Final    BOTTLES DRAWN AEROBIC AND ANAEROBIC Blood Culture adequate volume   Culture   Final    NO GROWTH 2 DAYS Performed at [facility], [address], [place], [place] [address]    Report Status PENDING  Incomplete  Urine culture     Status: None   Collection Time: 06/19/17  4:32 AM  Result Value Ref Range Status   Specimen Description   Final    URINE, CLEAN CATCH Performed at [facility], [address] [place], [place] [address]    Special Requests   Final    Normal Performed at [facility], [address], [place], [place] [address]    Culture   Final    NO GROWTH Performed at [facility], [place] [address], [place], [place] [address]    Report Status 06/21/2017 FINAL  Final  MRSA PCR Screening     Status: None   Collection Time: 06/19/17  4:32 AM  Result Value Ref Range Status   MRSA by PCR NEGATIVE NEGATIVE Final    Comment:        The GeneXpert MRSA Assay (FDA approved for NASAL specimens only), is one component of a comprehensive MRSA colonization surveillance program. It is not intended to diagnose MRSA infection nor to guide or monitor treatment for MRSA infections. Performed at [facility], [address] [place], [place] [address]      Labs: BNP (last 3 results) No results for input(s):  BNP in the last 8760 hours. Basic Metabolic Panel: Recent Labs  Lab 06/19/17 0021 06/19/17 0607 06/21/17 0503  NA 133*  140 141  K 4.1 3.8 4.7  CL 103 107 108  CO2 23 24 24   GLUCOSE 128* 105* 102*  BUN 39* 36* 35*  CREATININE 1.80* 1.70* 1.70*  CALCIUM 8.3* 8.3* 8.8*   Liver Function Tests: Recent Labs  Lab 06/19/17 0021  AST 16  ALT 10*  ALKPHOS 58  BILITOT 0.7  PROT 6.6  ALBUMIN 3.2*   No results for input(s): LIPASE, AMYLASE in the last 168 hours. No results for input(s): AMMONIA in the last 168 hours. CBC: Recent Labs  Lab 06/19/17 0021 06/19/17 0607 06/21/17 0503  WBC 14.4* 10.3 6.3  NEUTROABS 12.1*  --  4.2  HGB 10.7* 10.9* 11.6*  HCT 34.0* 34.4* 37.5*  MCV 95.5 96.9 97.2  PLT 181 161 164   Cardiac Enzymes: No results for input(s): CKTOTAL, CKMB, CKMBINDEX, TROPONINI in the last 168 hours. BNP: Invalid input(s): POCBNP CBG: No results for input(s): GLUCAP in the last 168 hours. D-Dimer No results for input(s): DDIMER in the last 72 hours. Hgb A1c No results for input(s): HGBA1C in the last 72 hours. Lipid Profile No results for input(s): CHOL, HDL, LDLCALC, TRIG, CHOLHDL, LDLDIRECT in the last 72 hours. Thyroid function studies No results for input(s): TSH, T4TOTAL, T3FREE, THYROIDAB in the last 72 hours.  Invalid input(s): FREET3 Anemia work up Recent Labs    06/19/17 0607  VITAMINB12 391  FOLATE 8.8  FERRITIN 365*  TIBC 195*  IRON 35*  RETICCTPCT 1.2   Urinalysis    Component Value Date/Time   COLORURINE STRAW (A) 06/19/2017 [place] 06/19/2017 0432   LABSPEC 1.006 06/19/2017 0432   PHURINE 6.0 06/19/2017 0432   GLUCOSEU NEGATIVE 06/19/2017 [place] NEGATIVE 06/19/2017 0432   BILIRUBINUR NEGATIVE 06/19/2017 0432   KETONESUR NEGATIVE 06/19/2017 0432   PROTEINUR NEGATIVE 06/19/2017 0432   UROBILINOGEN 1.0 12/23/2014 1853   NITRITE NEGATIVE 06/19/2017 0432   LEUKOCYTESUR NEGATIVE 06/19/2017 0432   Sepsis  Labs Invalid input(s): PROCALCITONIN,  WBC,  LACTICIDVEN Microbiology Recent Results (from the past 240 hour(s))  Blood Culture (routine x 2)     Status: None (Preliminary result)   Collection Time: 06/19/17 12:35 AM  Result Value Ref Range Status   Specimen Description BLOOD RIGHT WRIST  Final   Special Requests   Final    BOTTLES DRAWN AEROBIC AND ANAEROBIC Blood Culture adequate volume   Culture   Final    NO GROWTH 2 DAYS Performed at [facility], [address], [place], [place] [address]    Report Status PENDING  Incomplete  Wound or Superficial Culture     Status: None (Preliminary result)   Collection Time: 06/19/17 12:35 AM  Result Value Ref Range Status   Specimen Description   Final    SCALP Performed at [facility], [address], [place], [place] [address]    Special Requests   Final    Normal Performed at [facility], [address] [place], [place] [address]    Gram Stain   Final    RARE WBC PRESENT, PREDOMINANTLY PMN RARE GRAM POSITIVE COCCI IN PAIRS RARE GRAM POSITIVE RODS    Culture   Final    CULTURE REINCUBATED FOR BETTER GROWTH Performed at [facility], [place] [address] [place], [place] [address]    Report Status PENDING  Incomplete  Blood Culture (routine x 2)     Status: None (Preliminary result)   Collection Time: 06/19/17  1:05 AM  Result Value Ref  Range Status   Specimen Description BLOOD LEFT ARM  Final   Special Requests   Final    BOTTLES DRAWN AEROBIC AND ANAEROBIC Blood Culture adequate volume   Culture   Final    NO GROWTH 2 DAYS Performed at [facility], [address] [place], [place] [address]    Report Status PENDING  Incomplete  Urine culture     Status: None   Collection Time: 06/19/17  4:32 AM  Result Value Ref Range Status   Specimen Description   Final    URINE, CLEAN CATCH Performed at [facility], [address], [place], [place] [address]    Special Requests   Final    Normal Performed at [facility],  [address], [place], [place] [address]    Culture   Final    NO GROWTH Performed at [facility], [place] [address], [place], [place] [address]    Report Status 06/21/2017 FINAL  Final  MRSA PCR Screening     Status: None   Collection Time: 06/19/17  4:32 AM  Result Value Ref Range Status   MRSA by PCR NEGATIVE NEGATIVE Final    Comment:        The GeneXpert MRSA Assay (FDA approved for NASAL specimens only), is one component of a comprehensive MRSA colonization surveillance program. It is not intended to diagnose MRSA infection nor to guide or monitor treatment for MRSA infections. Performed at [facility], [address] [place], [place] [address]      Time coordinating discharge: 45 minutes  SIGNED:   Tawni Millers, MD  [facility] 06/21/2017, 9:48 AM Pager [phone]  If 7PM-7AM, please contact night-coverage [email or web address] Password [email or web address]

## 2017-06-21 NOTE — Progress Notes (Signed)
Patient discharged to [facility], instructions given to caretaker. Transportation provided by accepting facility. Vital signs stable.

## 2017-06-21 NOTE — Clinical Social Work Note (Addendum)
Facility notified of discharge and will pick patient up at 1:30.  LCSW spoke with [facility] [place] and advised that patient was discharging.   LCSW signing off.   Zoie Sarin, Clydene Pugh, LCSW

## 2017-06-21 NOTE — Plan of Care (Signed)
Will continue to monitor.

## 2017-06-23 DIAGNOSIS — C4402 Squamous cell carcinoma of skin of lip: Secondary | ICD-10-CM | POA: Diagnosis not present

## 2017-06-23 DIAGNOSIS — M6281 Muscle weakness (generalized): Secondary | ICD-10-CM | POA: Diagnosis not present

## 2017-06-23 DIAGNOSIS — Z9181 History of falling: Secondary | ICD-10-CM | POA: Diagnosis not present

## 2017-06-23 DIAGNOSIS — F329 Major depressive disorder, single episode, unspecified: Secondary | ICD-10-CM | POA: Diagnosis not present

## 2017-06-23 DIAGNOSIS — F0390 Unspecified dementia without behavioral disturbance: Secondary | ICD-10-CM | POA: Diagnosis not present

## 2017-06-23 DIAGNOSIS — Z48817 Encounter for surgical aftercare following surgery on the skin and subcutaneous tissue: Secondary | ICD-10-CM | POA: Diagnosis not present

## 2017-06-23 DIAGNOSIS — L989 Disorder of the skin and subcutaneous tissue, unspecified: Secondary | ICD-10-CM | POA: Diagnosis not present

## 2017-06-24 LAB — CULTURE, BLOOD (ROUTINE X 2)
CULTURE: NO GROWTH
CULTURE: NO GROWTH
Special Requests: ADEQUATE
Special Requests: ADEQUATE

## 2017-06-28 DIAGNOSIS — F329 Major depressive disorder, single episode, unspecified: Secondary | ICD-10-CM | POA: Diagnosis not present

## 2017-06-28 DIAGNOSIS — L989 Disorder of the skin and subcutaneous tissue, unspecified: Secondary | ICD-10-CM | POA: Diagnosis not present

## 2017-06-28 DIAGNOSIS — Z48817 Encounter for surgical aftercare following surgery on the skin and subcutaneous tissue: Secondary | ICD-10-CM | POA: Diagnosis not present

## 2017-06-28 DIAGNOSIS — M6281 Muscle weakness (generalized): Secondary | ICD-10-CM | POA: Diagnosis not present

## 2017-06-28 DIAGNOSIS — F0390 Unspecified dementia without behavioral disturbance: Secondary | ICD-10-CM | POA: Diagnosis not present

## 2017-06-28 DIAGNOSIS — Z9181 History of falling: Secondary | ICD-10-CM | POA: Diagnosis not present

## 2017-06-29 DIAGNOSIS — Z48817 Encounter for surgical aftercare following surgery on the skin and subcutaneous tissue: Secondary | ICD-10-CM | POA: Diagnosis not present

## 2017-06-30 DIAGNOSIS — L989 Disorder of the skin and subcutaneous tissue, unspecified: Secondary | ICD-10-CM | POA: Diagnosis not present

## 2017-06-30 DIAGNOSIS — Z48817 Encounter for surgical aftercare following surgery on the skin and subcutaneous tissue: Secondary | ICD-10-CM | POA: Diagnosis not present

## 2017-06-30 DIAGNOSIS — Z9181 History of falling: Secondary | ICD-10-CM | POA: Diagnosis not present

## 2017-06-30 DIAGNOSIS — F329 Major depressive disorder, single episode, unspecified: Secondary | ICD-10-CM | POA: Diagnosis not present

## 2017-06-30 DIAGNOSIS — F0390 Unspecified dementia without behavioral disturbance: Secondary | ICD-10-CM | POA: Diagnosis not present

## 2017-06-30 DIAGNOSIS — M6281 Muscle weakness (generalized): Secondary | ICD-10-CM | POA: Diagnosis not present

## 2017-07-02 DIAGNOSIS — F0390 Unspecified dementia without behavioral disturbance: Secondary | ICD-10-CM | POA: Diagnosis not present

## 2017-07-02 DIAGNOSIS — Z48817 Encounter for surgical aftercare following surgery on the skin and subcutaneous tissue: Secondary | ICD-10-CM | POA: Diagnosis not present

## 2017-07-02 DIAGNOSIS — M6281 Muscle weakness (generalized): Secondary | ICD-10-CM | POA: Diagnosis not present

## 2017-07-02 DIAGNOSIS — F329 Major depressive disorder, single episode, unspecified: Secondary | ICD-10-CM | POA: Diagnosis not present

## 2017-07-02 DIAGNOSIS — L989 Disorder of the skin and subcutaneous tissue, unspecified: Secondary | ICD-10-CM | POA: Diagnosis not present

## 2017-07-02 DIAGNOSIS — Z9181 History of falling: Secondary | ICD-10-CM | POA: Diagnosis not present

## 2017-07-04 DIAGNOSIS — Z48817 Encounter for surgical aftercare following surgery on the skin and subcutaneous tissue: Secondary | ICD-10-CM | POA: Diagnosis not present

## 2017-07-04 DIAGNOSIS — F0390 Unspecified dementia without behavioral disturbance: Secondary | ICD-10-CM | POA: Diagnosis not present

## 2017-07-04 DIAGNOSIS — M6281 Muscle weakness (generalized): Secondary | ICD-10-CM | POA: Diagnosis not present

## 2017-07-04 DIAGNOSIS — F329 Major depressive disorder, single episode, unspecified: Secondary | ICD-10-CM | POA: Diagnosis not present

## 2017-07-04 DIAGNOSIS — L989 Disorder of the skin and subcutaneous tissue, unspecified: Secondary | ICD-10-CM | POA: Diagnosis not present

## 2017-07-04 DIAGNOSIS — Z9181 History of falling: Secondary | ICD-10-CM | POA: Diagnosis not present

## 2017-07-05 ENCOUNTER — Other Ambulatory Visit: Payer: Self-pay

## 2017-07-05 ENCOUNTER — Observation Stay (HOSPITAL_COMMUNITY)
Admission: EM | Admit: 2017-07-05 | Discharge: 2017-07-07 | Disposition: A | Discharge status: Skilled Nursing Facility | Payer: Medicare Other | Attending: Family Medicine | Admitting: Family Medicine

## 2017-07-05 ENCOUNTER — Encounter (HOSPITAL_COMMUNITY): Payer: Self-pay | Admitting: Emergency Medicine

## 2017-07-05 DIAGNOSIS — N183 Chronic kidney disease, stage 3 unspecified: Secondary | ICD-10-CM | POA: Diagnosis present

## 2017-07-05 DIAGNOSIS — Z7982 Long term (current) use of aspirin: Secondary | ICD-10-CM | POA: Diagnosis not present

## 2017-07-05 DIAGNOSIS — Z87442 Personal history of urinary calculi: Secondary | ICD-10-CM | POA: Insufficient documentation

## 2017-07-05 DIAGNOSIS — I781 Nevus, non-neoplastic: Secondary | ICD-10-CM | POA: Insufficient documentation

## 2017-07-05 DIAGNOSIS — J449 Chronic obstructive pulmonary disease, unspecified: Secondary | ICD-10-CM | POA: Diagnosis not present

## 2017-07-05 DIAGNOSIS — Z79899 Other long term (current) drug therapy: Secondary | ICD-10-CM | POA: Diagnosis not present

## 2017-07-05 DIAGNOSIS — F0390 Unspecified dementia without behavioral disturbance: Secondary | ICD-10-CM | POA: Diagnosis not present

## 2017-07-05 DIAGNOSIS — I700 Atherosclerosis of aorta: Secondary | ICD-10-CM | POA: Diagnosis not present

## 2017-07-05 DIAGNOSIS — D649 Anemia, unspecified: Secondary | ICD-10-CM | POA: Diagnosis present

## 2017-07-05 DIAGNOSIS — N400 Enlarged prostate without lower urinary tract symptoms: Secondary | ICD-10-CM | POA: Insufficient documentation

## 2017-07-05 DIAGNOSIS — G2581 Restless legs syndrome: Secondary | ICD-10-CM | POA: Diagnosis not present

## 2017-07-05 DIAGNOSIS — K449 Diaphragmatic hernia without obstruction or gangrene: Secondary | ICD-10-CM | POA: Insufficient documentation

## 2017-07-05 DIAGNOSIS — G629 Polyneuropathy, unspecified: Secondary | ICD-10-CM | POA: Insufficient documentation

## 2017-07-05 DIAGNOSIS — L989 Disorder of the skin and subcutaneous tissue, unspecified: Secondary | ICD-10-CM | POA: Diagnosis not present

## 2017-07-05 DIAGNOSIS — F329 Major depressive disorder, single episode, unspecified: Secondary | ICD-10-CM | POA: Diagnosis not present

## 2017-07-05 DIAGNOSIS — M6281 Muscle weakness (generalized): Secondary | ICD-10-CM | POA: Diagnosis not present

## 2017-07-05 DIAGNOSIS — Z48817 Encounter for surgical aftercare following surgery on the skin and subcutaneous tissue: Secondary | ICD-10-CM | POA: Diagnosis not present

## 2017-07-05 DIAGNOSIS — Z880 Allergy status to penicillin: Secondary | ICD-10-CM | POA: Diagnosis not present

## 2017-07-05 DIAGNOSIS — M79674 Pain in right toe(s): Secondary | ICD-10-CM | POA: Diagnosis not present

## 2017-07-05 DIAGNOSIS — F17210 Nicotine dependence, cigarettes, uncomplicated: Secondary | ICD-10-CM | POA: Insufficient documentation

## 2017-07-05 DIAGNOSIS — K219 Gastro-esophageal reflux disease without esophagitis: Secondary | ICD-10-CM | POA: Insufficient documentation

## 2017-07-05 DIAGNOSIS — K922 Gastrointestinal hemorrhage, unspecified: Secondary | ICD-10-CM

## 2017-07-05 DIAGNOSIS — K920 Hematemesis: Secondary | ICD-10-CM

## 2017-07-05 DIAGNOSIS — M79675 Pain in left toe(s): Secondary | ICD-10-CM | POA: Diagnosis not present

## 2017-07-05 DIAGNOSIS — K2980 Duodenitis without bleeding: Secondary | ICD-10-CM | POA: Diagnosis not present

## 2017-07-05 DIAGNOSIS — J181 Lobar pneumonia, unspecified organism: Secondary | ICD-10-CM | POA: Diagnosis not present

## 2017-07-05 DIAGNOSIS — K2270 Barrett's esophagus without dysplasia: Principal | ICD-10-CM | POA: Insufficient documentation

## 2017-07-05 DIAGNOSIS — G309 Alzheimer's disease, unspecified: Secondary | ICD-10-CM | POA: Diagnosis not present

## 2017-07-05 DIAGNOSIS — Z9181 History of falling: Secondary | ICD-10-CM | POA: Diagnosis not present

## 2017-07-05 DIAGNOSIS — B351 Tinea unguium: Secondary | ICD-10-CM | POA: Diagnosis not present

## 2017-07-05 NOTE — ED Triage Notes (Signed)
Pt vomited once was coffee ground appearance. Pt states after he vomited he feels much better. Pt is from [place].

## 2017-07-06 ENCOUNTER — Other Ambulatory Visit: Payer: Self-pay

## 2017-07-06 ENCOUNTER — Encounter (HOSPITAL_COMMUNITY): Payer: Self-pay | Admitting: *Deleted

## 2017-07-06 ENCOUNTER — Encounter (HOSPITAL_COMMUNITY)
Admission: EM | Disposition: A | Discharge status: Skilled Nursing Facility | Payer: Self-pay | Source: Home / Self Care | Attending: Emergency Medicine

## 2017-07-06 DIAGNOSIS — K228 Other specified diseases of esophagus: Secondary | ICD-10-CM | POA: Diagnosis not present

## 2017-07-06 DIAGNOSIS — K449 Diaphragmatic hernia without obstruction or gangrene: Secondary | ICD-10-CM | POA: Diagnosis not present

## 2017-07-06 DIAGNOSIS — K920 Hematemesis: Secondary | ICD-10-CM | POA: Diagnosis not present

## 2017-07-06 DIAGNOSIS — D649 Anemia, unspecified: Secondary | ICD-10-CM | POA: Diagnosis not present

## 2017-07-06 DIAGNOSIS — K2980 Duodenitis without bleeding: Secondary | ICD-10-CM | POA: Diagnosis not present

## 2017-07-06 DIAGNOSIS — K2270 Barrett's esophagus without dysplasia: Secondary | ICD-10-CM | POA: Diagnosis not present

## 2017-07-06 HISTORY — PX: ESOPHAGOGASTRODUODENOSCOPY: SHX5428

## 2017-07-06 LAB — PROTIME-INR
INR: 1.06
Prothrombin Time: 13.7 seconds (ref 11.4–15.2)

## 2017-07-06 LAB — CBC WITH DIFFERENTIAL/PLATELET
Basophils Absolute: 0 10*3/uL (ref 0.0–0.1)
Basophils Relative: 0 %
EOS ABS: 0.3 10*3/uL (ref 0.0–0.7)
EOS PCT: 4 %
HCT: 38.4 % — ABNORMAL LOW (ref 39.0–52.0)
Hemoglobin: 12 g/dL — ABNORMAL LOW (ref 13.0–17.0)
LYMPHS ABS: 0.6 10*3/uL — AB (ref 0.7–4.0)
Lymphocytes Relative: 8 %
MCH: 29.9 pg (ref 26.0–34.0)
MCHC: 31.3 g/dL (ref 30.0–36.0)
MCV: 95.8 fL (ref 78.0–100.0)
MONOS PCT: 12 %
Monocytes Absolute: 0.9 10*3/uL (ref 0.1–1.0)
Neutro Abs: 6.1 10*3/uL (ref 1.7–7.7)
Neutrophils Relative %: 76 %
PLATELETS: 189 10*3/uL (ref 150–400)
RBC: 4.01 MIL/uL — ABNORMAL LOW (ref 4.22–5.81)
RDW: 15.2 % (ref 11.5–15.5)
WBC: 7.9 10*3/uL (ref 4.0–10.5)

## 2017-07-06 LAB — COMPREHENSIVE METABOLIC PANEL
ALBUMIN: 3.2 g/dL — AB (ref 3.5–5.0)
ALT: 10 U/L — ABNORMAL LOW (ref 17–63)
AST: 13 U/L — ABNORMAL LOW (ref 15–41)
Alkaline Phosphatase: 91 U/L (ref 38–126)
Anion gap: 11 (ref 5–15)
BILIRUBIN TOTAL: 0.6 mg/dL (ref 0.3–1.2)
BUN: 41 mg/dL — AB (ref 6–20)
CO2: 25 mmol/L (ref 22–32)
CREATININE: 1.43 mg/dL — AB (ref 0.61–1.24)
Calcium: 8.5 mg/dL — ABNORMAL LOW (ref 8.9–10.3)
Chloride: 109 mmol/L (ref 101–111)
GFR calc Af Amer: 53 mL/min — ABNORMAL LOW (ref >60)
GFR calc non Af Amer: 45 mL/min — ABNORMAL LOW (ref >60)
Glucose, Bld: 106 mg/dL — ABNORMAL HIGH (ref 65–99)
Potassium: 4.2 mmol/L (ref 3.5–5.1)
SODIUM: 145 mmol/L (ref 135–145)
Total Protein: 6.8 g/dL (ref 6.5–8.1)

## 2017-07-06 LAB — SAMPLE TO BLOOD BANK

## 2017-07-06 LAB — APTT: aPTT: 63 seconds — ABNORMAL HIGH (ref 24–36)

## 2017-07-06 LAB — POC OCCULT BLOOD, ED: Fecal Occult Bld: NEGATIVE

## 2017-07-06 LAB — LACTIC ACID, PLASMA: Lactic Acid, Venous: 1.2 mmol/L (ref 0.5–1.9)

## 2017-07-06 LAB — HEMOGLOBIN AND HEMATOCRIT, BLOOD
HEMATOCRIT: 35 % — AB (ref 39.0–52.0)
Hemoglobin: 11 g/dL — ABNORMAL LOW (ref 13.0–17.0)

## 2017-07-06 LAB — HEMATOCRIT: HCT: 34.7 % — ABNORMAL LOW (ref 39.0–52.0)

## 2017-07-06 LAB — HEMOGLOBIN: Hemoglobin: 10.8 g/dL — ABNORMAL LOW (ref 13.0–17.0)

## 2017-07-06 SURGERY — EGD (ESOPHAGOGASTRODUODENOSCOPY)
Anesthesia: Moderate Sedation

## 2017-07-06 MED ORDER — MIDAZOLAM HCL 5 MG/5ML IJ SOLN
INTRAMUSCULAR | Status: DC | PRN
  Administered 2017-07-06: 2 mg via INTRAVENOUS
  Administered 2017-07-06: 1 mg via INTRAVENOUS

## 2017-07-06 MED ORDER — WHITE PETROLATUM EX OINT
TOPICAL_OINTMENT | Freq: Every day | CUTANEOUS | Status: DC
  Administered 2017-07-07: 10:00:00 via TOPICAL
  Filled 2017-07-06: qty 28.35

## 2017-07-06 MED ORDER — TAMSULOSIN HCL 0.4 MG PO CAPS
0.4000 mg | ORAL_CAPSULE | Freq: Every day | ORAL | Status: DC
  Administered 2017-07-06 – 2017-07-07 (×2): 0.4 mg via ORAL
  Filled 2017-07-06 (×2): qty 1

## 2017-07-06 MED ORDER — BACITRACIN-NEOMYCIN-POLYMYXIN 400-5-5000 EX OINT
TOPICAL_OINTMENT | Freq: Three times a day (TID) | CUTANEOUS | Status: DC
  Administered 2017-07-06: 1 via TOPICAL
  Filled 2017-07-06: qty 1

## 2017-07-06 MED ORDER — MEPERIDINE HCL 50 MG/ML IJ SOLN
INTRAMUSCULAR | Status: AC
  Filled 2017-07-06: qty 1

## 2017-07-06 MED ORDER — TRIAMCINOLONE ACETONIDE 0.5 % EX OINT
1.0000 "application " | TOPICAL_OINTMENT | Freq: Two times a day (BID) | CUTANEOUS | Status: DC
  Administered 2017-07-07: 1 via TOPICAL
  Filled 2017-07-06: qty 15

## 2017-07-06 MED ORDER — PANTOPRAZOLE SODIUM 40 MG IV SOLR
40.0000 mg | Freq: Once | INTRAVENOUS | Status: AC
  Administered 2017-07-06: 40 mg via INTRAVENOUS
  Filled 2017-07-06: qty 40

## 2017-07-06 MED ORDER — SERTRALINE HCL 50 MG PO TABS
50.0000 mg | ORAL_TABLET | Freq: Every day | ORAL | Status: DC
  Administered 2017-07-06 – 2017-07-07 (×2): 50 mg via ORAL
  Filled 2017-07-06 (×2): qty 1

## 2017-07-06 MED ORDER — GABAPENTIN 400 MG PO CAPS
400.0000 mg | ORAL_CAPSULE | Freq: Three times a day (TID) | ORAL | Status: DC
  Administered 2017-07-06 – 2017-07-07 (×3): 400 mg via ORAL
  Filled 2017-07-06 (×4): qty 1

## 2017-07-06 MED ORDER — FLUTICASONE PROPIONATE 50 MCG/ACT NA SUSP
1.0000 | Freq: Two times a day (BID) | NASAL | Status: DC
  Administered 2017-07-06 – 2017-07-07 (×3): 1 via NASAL
  Filled 2017-07-06 (×3): qty 16

## 2017-07-06 MED ORDER — VITAMIN D 1000 UNITS PO TABS
2000.0000 [IU] | ORAL_TABLET | Freq: Every day | ORAL | Status: DC
  Administered 2017-07-06 – 2017-07-07 (×2): 2000 [IU] via ORAL
  Filled 2017-07-06 (×3): qty 2

## 2017-07-06 MED ORDER — VITAMIN B-12 1000 MCG PO TABS
1000.0000 ug | ORAL_TABLET | Freq: Every day | ORAL | Status: DC
  Administered 2017-07-06 – 2017-07-07 (×2): 1000 ug via ORAL
  Filled 2017-07-06 (×3): qty 1

## 2017-07-06 MED ORDER — FAMOTIDINE IN NACL 20-0.9 MG/50ML-% IV SOLN
20.0000 mg | Freq: Two times a day (BID) | INTRAVENOUS | Status: DC
  Administered 2017-07-06 – 2017-07-07 (×4): 20 mg via INTRAVENOUS
  Filled 2017-07-06 (×4): qty 50

## 2017-07-06 MED ORDER — BACLOFEN 10 MG PO TABS
10.0000 mg | ORAL_TABLET | Freq: Two times a day (BID) | ORAL | Status: DC
  Administered 2017-07-06 – 2017-07-07 (×4): 10 mg via ORAL
  Filled 2017-07-06 (×4): qty 1

## 2017-07-06 MED ORDER — SODIUM CHLORIDE 0.9% FLUSH
3.0000 mL | INTRAVENOUS | Status: DC | PRN
Start: 2017-07-06 — End: 2017-07-07

## 2017-07-06 MED ORDER — ACETAMINOPHEN 325 MG PO TABS
650.0000 mg | ORAL_TABLET | Freq: Four times a day (QID) | ORAL | Status: DC | PRN
  Administered 2017-07-06: 650 mg via ORAL
  Filled 2017-07-06: qty 2

## 2017-07-06 MED ORDER — SODIUM CHLORIDE 0.9 % IV BOLUS
1000.0000 mL | Freq: Once | INTRAVENOUS | Status: AC
  Administered 2017-07-06: 1000 mL via INTRAVENOUS

## 2017-07-06 MED ORDER — ONDANSETRON HCL 4 MG/2ML IJ SOLN: 4.0000 mg | Freq: Four times a day (QID) | INTRAMUSCULAR | Status: DC | PRN

## 2017-07-06 MED ORDER — ACETAMINOPHEN 650 MG RE SUPP: 650.0000 mg | Freq: Four times a day (QID) | RECTAL | Status: DC | PRN

## 2017-07-06 MED ORDER — SODIUM CHLORIDE 0.9 % IV SOLN
250.0000 mL | INTRAVENOUS | Status: DC | PRN
Start: 2017-07-06 — End: 2017-07-07

## 2017-07-06 MED ORDER — PRAMIPEXOLE DIHYDROCHLORIDE 0.25 MG PO TABS
0.1250 mg | ORAL_TABLET | Freq: Every day | ORAL | Status: DC
  Administered 2017-07-06: 0.125 mg via ORAL
  Filled 2017-07-06 (×2): qty 1

## 2017-07-06 MED ORDER — MOMETASONE FURO-FORMOTEROL FUM 200-5 MCG/ACT IN AERO
2.0000 | INHALATION_SPRAY | Freq: Two times a day (BID) | RESPIRATORY_TRACT | Status: DC
  Administered 2017-07-06 – 2017-07-07 (×3): 2 via RESPIRATORY_TRACT
  Filled 2017-07-06: qty 8.8

## 2017-07-06 MED ORDER — MUPIROCIN CALCIUM 2 % EX CREA
TOPICAL_CREAM | Freq: Two times a day (BID) | CUTANEOUS | Status: DC
  Administered 2017-07-07: 10:00:00 via TOPICAL
  Filled 2017-07-06: qty 15

## 2017-07-06 MED ORDER — BUPROPION HCL ER (SR) 150 MG PO TB12
150.0000 mg | ORAL_TABLET | Freq: Two times a day (BID) | ORAL | Status: DC
  Administered 2017-07-06 – 2017-07-07 (×4): 150 mg via ORAL
  Filled 2017-07-06 (×6): qty 1

## 2017-07-06 MED ORDER — LIDOCAINE VISCOUS 2 % MT SOLN
OROMUCOSAL | Status: DC | PRN
  Administered 2017-07-06: 1 via OROMUCOSAL

## 2017-07-06 MED ORDER — SODIUM CHLORIDE 0.9% FLUSH
3.0000 mL | Freq: Two times a day (BID) | INTRAVENOUS | Status: DC
  Administered 2017-07-06 – 2017-07-07 (×4): 3 mL via INTRAVENOUS

## 2017-07-06 MED ORDER — TRAZODONE HCL 50 MG PO TABS
200.0000 mg | ORAL_TABLET | Freq: Every day | ORAL | Status: DC
  Administered 2017-07-06: 200 mg via ORAL
  Filled 2017-07-06: qty 4

## 2017-07-06 MED ORDER — CALCIUM CARBONATE-VITAMIN D 500-200 MG-UNIT PO TABS
1.0000 | ORAL_TABLET | Freq: Every day | ORAL | Status: DC
  Administered 2017-07-06 – 2017-07-07 (×2): 1 via ORAL
  Filled 2017-07-06 (×3): qty 1

## 2017-07-06 MED ORDER — LIDOCAINE VISCOUS 2 % MT SOLN
OROMUCOSAL | Status: AC
  Filled 2017-07-06: qty 15

## 2017-07-06 MED ORDER — MIDAZOLAM HCL 5 MG/5ML IJ SOLN
INTRAMUSCULAR | Status: AC
Start: 2017-07-06 — End: 2017-07-07
  Filled 2017-07-06: qty 10

## 2017-07-06 MED ORDER — MEMANTINE HCL 10 MG PO TABS
10.0000 mg | ORAL_TABLET | Freq: Two times a day (BID) | ORAL | Status: DC
  Administered 2017-07-06 – 2017-07-07 (×5): 10 mg via ORAL
  Filled 2017-07-06 (×4): qty 1

## 2017-07-06 MED ORDER — ONDANSETRON HCL 4 MG PO TABS: 4.0000 mg | ORAL_TABLET | Freq: Four times a day (QID) | ORAL | Status: DC | PRN

## 2017-07-06 MED ORDER — ORAL CARE MOUTH RINSE
15.0000 mL | Freq: Two times a day (BID) | OROMUCOSAL | Status: DC
  Administered 2017-07-07: 15 mL via OROMUCOSAL

## 2017-07-06 MED ORDER — IPRATROPIUM-ALBUTEROL 0.5-2.5 (3) MG/3ML IN SOLN: 3.0000 mL | Freq: Four times a day (QID) | RESPIRATORY_TRACT | Status: DC | PRN

## 2017-07-06 MED ORDER — SODIUM CHLORIDE 0.9 % IV SOLN: INTRAVENOUS | Status: DC

## 2017-07-06 NOTE — H&P (Signed)
History and Physical    Jodie Leiner PPJ:[id number] DOB: [birth year] DOA: 07/05/2017  PCP: Rosita Fire, MD   Patient coming from: SNF  Chief Complaint: Vomiting with epigastric abdominal pain  HPI: Anthony [name] is a 77 y.o. male with medical history significant for COPD, CKD stage III, and dementia who states that he had epigastric abdominal pain that developed earlier this evening and was associated with belching.  He states that he had an episode of vomiting with dark, brown emesis and staff reported this as appearing coffee-ground in appearance.  He currently states that after the episode of emesis he began feeling much better and denies any further symptomatology such as further nausea. He denies any fever, chills, lightheadedness, dizziness, diarrhea, dark/tarry stools, or blood in stools.   ED Course: Vital signs are stable and laboratory data remarkable for BUN 41 and creatinine 1.43, this is near his baseline as he does have stage III CKD.  He does have anemia that appears to be stable with hemoglobin 12.  Review of Systems: All others reviewed and otherwise negative.  Past Medical History:  Diagnosis Date  . Acute pyelonephritis 12/23/2014  . Asthma   . BPH (benign prostatic hyperplasia)   . Chronic back pain   . Chronic hip pain   . COPD (chronic obstructive pulmonary disease) ([place])   . Dementia   . Depression   . GERD (gastroesophageal reflux disease)   . Kidney stone   . Neuropathy   . Pulmonary nodules 12/25/2014  . Restless leg syndrome     Past Surgical History:  Procedure Laterality Date  . ABDOMINAL SURGERY     heria  . EYE SURGERY       reports that he has been smoking cigarettes.  He has been smoking about 0.50 packs per day. He has never used smokeless tobacco. He reports that he does not drink alcohol or use drugs.    Family History  Family history unknown: Yes    Prior to Admission medications   Medication Sig Start Date End Date Taking?  Authorizing Provider  acetaminophen (TYLENOL) 500 MG tablet Take 500 mg every 8 (eight) hours as needed by mouth for mild pain.    [provider]  albuterol (PROVENTIL HFA;VENTOLIN HFA) 108 (90 BASE) MCG/ACT inhaler Inhale 2 puffs into the lungs every 4 (four) hours as needed for wheezing or shortness of breath.    [provider]  albuterol (PROVENTIL) (2.5 MG/3ML) 0.083% nebulizer solution Take 2.5 mg every 6 (six) hours as needed by nebulization for wheezing or shortness of breath.    [provider]  aspirin EC 81 MG tablet Take 81 mg by mouth daily.    [provider]  baclofen (LIORESAL) 10 MG tablet Take 10 mg by mouth 2 (two) times daily.    [provider]  buPROPion (WELLBUTRIN SR) 150 MG 12 hr tablet Take 150 mg by mouth 2 (two) times daily.    [provider]  Calcium Citrate-Vitamin D (CALCIUM CITRATE+D3 PETITES) 200-250 MG-UNIT TABS Take 1 tablet by mouth daily.    [provider]  cholecalciferol (VITAMIN D) 1000 units tablet Take 2,000 Units by mouth daily.    [provider]  cyanocobalamin 1000 MCG tablet Take 1,000 mcg by mouth daily.    [provider]  fluticasone (FLONASE) 50 MCG/ACT nasal spray Place 1 spray 2 (two) times daily into both nostrils.     [provider]  Fluticasone-Salmeterol (ADVAIR) 250-50 MCG/DOSE AEPB Inhale 1 puff  2 (two) times daily into the lungs.    [provider]  gabapentin (NEURONTIN) 400 MG capsule Take 400 mg by mouth 3 (three) times daily.    [provider]  ipratropium (ATROVENT) 0.02 % nebulizer solution Take 0.5 mg 4 (four) times daily by nebulization.    [provider]  memantine (NAMENDA) 10 MG tablet Take 10 mg by mouth 2 (two) times daily.    [provider]  neomycin-bacitracin-polymyxin (NEOSPORIN) ointment Apply topically 3 (three) times daily. Apply to scalp wound. 06/21/17   Arrien, Jimmy Picket, MD    omeprazole (PRILOSEC) 20 MG capsule Take 20 mg by mouth daily.    [provider]  pramipexole (MIRAPEX) 0.125 MG tablet Take 0.125 mg by mouth at bedtime.    [provider]  sertraline (ZOLOFT) 50 MG tablet Take 50 mg by mouth daily.    [provider]  tamsulosin (FLOMAX) 0.4 MG CAPS capsule Take 0.4 mg by mouth daily as needed.     [provider]  traZODone (DESYREL) 100 MG tablet Take 200 mg by mouth at bedtime.    [provider]  triamcinolone ointment (KENALOG) 0.5 % Apply 1 application topically 2 (two) times daily. Apply to affected areas of forearms twice daily.    [provider]    Physical Exam: Vitals:   07/05/17 2353 07/05/17 2356 07/06/17 0130  BP:  114/64 (!) 111/53  Pulse:  75 67  Resp:  17 16  Temp:  98.8 F (37.1 C)   TempSrc:  Oral   SpO2:  98% 95%  Weight: 74.8 kg (165 lb)    Height: 6\' 2"  (1.88 m)      Constitutional: NAD, calm, comfortable Vitals:   07/05/17 2353 07/05/17 2356 07/06/17 0130  BP:  114/64 (!) 111/53  Pulse:  75 67  Resp:  17 16  Temp:  98.8 F (37.1 C)   TempSrc:  Oral   SpO2:  98% 95%  Weight: 74.8 kg (165 lb)    Height: 6\' 2"  (1.88 m)     Eyes: lids and conjunctivae normal ENMT: Mucous membranes are moist.  Neck: normal, supple Respiratory: clear to auscultation bilaterally. Normal respiratory effort. No accessory muscle use.  Cardiovascular: Regular rate and rhythm, no murmurs. No extremity edema. Abdomen: no tenderness, no distention. Bowel sounds positive.  Musculoskeletal:  No joint deformity upper and lower extremities.   Skin: no rashes, lesions, ulcers.   Labs on Admission: I have personally reviewed following labs and imaging studies  CBC: Recent Labs  Lab 07/06/17 0049  WBC 7.9  NEUTROABS 6.1  HGB 12.0*  HCT 38.4*  MCV 95.8  PLT 403   Basic Metabolic Panel: Recent Labs  Lab 07/06/17 0049  NA 145  K 4.2  CL 109  CO2 25  GLUCOSE 106*  BUN 41*   CREATININE 1.43*  CALCIUM 8.5*   GFR: Estimated Creatinine Clearance: 45 mL/min (A) (by C-G formula based on SCr of 1.43 mg/dL (H)). Liver Function Tests: Recent Labs  Lab 07/06/17 0049  AST 13*  ALT 10*  ALKPHOS 91  BILITOT 0.6  PROT 6.8  ALBUMIN 3.2*   No results for input(s): LIPASE, AMYLASE in the last 168 hours. No results for input(s): AMMONIA in the last 168 hours. Coagulation Profile: Recent Labs  Lab 07/06/17 0049  INR 1.06   Cardiac Enzymes: No results for input(s): CKTOTAL, CKMB, CKMBINDEX, TROPONINI in the last 168 hours. BNP (last 3 results) No results for input(s): PROBNP  in the last 8760 hours. HbA1C: No results for input(s): HGBA1C in the last 72 hours. CBG: No results for input(s): GLUCAP in the last 168 hours. Lipid Profile: No results for input(s): CHOL, HDL, LDLCALC, TRIG, CHOLHDL, LDLDIRECT in the last 72 hours. Thyroid Function Tests: No results for input(s): TSH, T4TOTAL, FREET4, T3FREE, THYROIDAB in the last 72 hours. Anemia Panel: No results for input(s): VITAMINB12, FOLATE, FERRITIN, TIBC, IRON, RETICCTPCT in the last 72 hours. Urine analysis:    Component Value Date/Time   COLORURINE STRAW (A) 06/19/2017 0432   APPEARANCEUR CLEAR 06/19/2017 0432   LABSPEC 1.006 06/19/2017 0432   PHURINE 6.0 06/19/2017 0432   GLUCOSEU NEGATIVE 06/19/2017 0432   HGBUR NEGATIVE 06/19/2017 0432   [place] NEGATIVE 06/19/2017 0432   KETONESUR NEGATIVE 06/19/2017 0432   PROTEINUR NEGATIVE 06/19/2017 0432   UROBILINOGEN 1.0 12/23/2014 1853   NITRITE NEGATIVE 06/19/2017 0432   LEUKOCYTESUR NEGATIVE 06/19/2017 0432    Radiological Exams on Admission: No results found.  Assessment/Plan Principal Problem:   Hematemesis Active Problems:   Normocytic anemia   COPD (chronic obstructive pulmonary disease) (HCC)   CKD (chronic kidney disease) stage 3, GFR 30-59 ml/min (HCC)   Dementia    1. Questionable hematemesis with stable normocytic anemia.   Place on Pepcid IV twice a day and start on clear liquid diet and advance as tolerated.  Zofran as needed for nausea and vomiting.  Repeat H&H at 0600 and again at noon.  Advance diet to heart healthy as tolerated.  If he is otherwise doing well with stable H&H-could likely be discharged later in the day.  Otherwise consider GI consultation.  Avoid aspirin and maintain on SCDs. 2. COPD.  No active bronchospasm currently noted.  Duo nebs as needed for shortness of breath or wheezing. 3. CKD stage III.  Currently at baseline.  Monitor with repeat labs as needed. 4. Dementia.  Continue Namenda.   DVT prophylaxis: SCDs Code Status: Full Family Communication: None at bedside Disposition Plan:Observe for possible GI bleed Consults called:None Admission status: Obs, med-surg   Gabryel Talamo Darleen Crocker DO [facility] Pager [phone]  If 7PM-7AM, please contact night-coverage [email or web address] Password [email or web address]  07/06/2017, 2:06 AM

## 2017-07-06 NOTE — ED Provider Notes (Signed)
[facility] EMERGENCY DEPARTMENT Provider Note   CSN: [id number] Arrival date & time: 07/05/17  2348  Time seen 12:43 AM   History   Chief Complaint Chief Complaint  Patient presents with  . Hematemesis   LEVEL 5 CAVEAT FOR DEMENTIA  HPI Anthony [name] is a 77 y.o. male.  HPI    when I asked the patient how he is feeling tonight he states "with my hands".  Patient was at his nursing facility tonight.  He states he was having some abdominal pain earlier and indicates his upper abdomen.  He states he had a lot of belching and then he states he vomited a lot of brown fluid.  He states now he feels better.  He denies feeling dizzy or lightheaded.  EMS  state the vomitus was described as coffee-ground in appearance.  PCP Rosita Fire, MD   Past Medical History:  Diagnosis Date  . Acute pyelonephritis 12/23/2014  . Asthma   . BPH (benign prostatic hyperplasia)   . Chronic back pain   . Chronic hip pain   . COPD (chronic obstructive pulmonary disease) ([place])   . Dementia   . Depression   . GERD (gastroesophageal reflux disease)   . Kidney stone   . Neuropathy   . Pulmonary nodules 12/25/2014  . Restless leg syndrome     Patient Active Problem List   Diagnosis Date Noted  . Normochromic normocytic anemia 06/19/2017  . CKD (chronic kidney disease) stage 3, GFR 30-59 ml/min (HCC) 06/19/2017  . Dementia 06/19/2017  . Decubitus ulcer 06/19/2017  . Open wnd of scalp   . S/p left hip fracture   . HCAP (healthcare-associated pneumonia) 10/31/2016  . CKD (chronic kidney disease) 10/31/2016  . COPD (chronic obstructive pulmonary disease) ([place]) 10/31/2016  . Hyponatremia 12/25/2014  . Hypokalemia 12/25/2014  . Normocytic anemia 12/25/2014  . Pulmonary nodules 12/25/2014  . Tobacco abuse 12/25/2014  . ARF (acute renal failure) ([place]) 12/24/2014  . Acute pyelonephritis 12/23/2014    Past Surgical History:  Procedure Laterality Date  . ABDOMINAL SURGERY     heria  . EYE  SURGERY          Home Medications    Prior to Admission medications   Medication Sig Start Date End Date Taking? Authorizing Provider  acetaminophen (TYLENOL) 500 MG tablet Take 500 mg every 8 (eight) hours as needed by mouth for mild pain.    [provider]  albuterol (PROVENTIL HFA;VENTOLIN HFA) 108 (90 BASE) MCG/ACT inhaler Inhale 2 puffs into the lungs every 4 (four) hours as needed for wheezing or shortness of breath.    [provider]  albuterol (PROVENTIL) (2.5 MG/3ML) 0.083% nebulizer solution Take 2.5 mg every 6 (six) hours as needed by nebulization for wheezing or shortness of breath.    [provider]  aspirin EC 81 MG tablet Take 81 mg by mouth daily.    [provider]  baclofen (LIORESAL) 10 MG tablet Take 10 mg by mouth 2 (two) times daily.    [provider]  buPROPion (WELLBUTRIN SR) 150 MG 12 hr tablet Take 150 mg by mouth 2 (two) times daily.    [provider]  Calcium Citrate-Vitamin D (CALCIUM CITRATE+D3 PETITES) 200-250 MG-UNIT TABS Take 1 tablet by mouth daily.    [provider]  cholecalciferol (VITAMIN D) 1000 units tablet Take 2,000 Units by mouth daily.    [provider]  cyanocobalamin 1000 MCG tablet Take 1,000 mcg by mouth daily.  [provider]  fluticasone (FLONASE) 50 MCG/ACT nasal spray Place 1 spray 2 (two) times daily into both nostrils.     [provider]  Fluticasone-Salmeterol (ADVAIR) 250-50 MCG/DOSE AEPB Inhale 1 puff 2 (two) times daily into the lungs.    [provider]  gabapentin (NEURONTIN) 400 MG capsule Take 400 mg by mouth 3 (three) times daily.    [provider]  ipratropium (ATROVENT) 0.02 % nebulizer solution Take 0.5 mg 4 (four) times daily by nebulization.    [provider]  memantine (NAMENDA) 10 MG tablet Take 10 mg by mouth 2 (two) times daily.    [provider]  neomycin-bacitracin-polymyxin  (NEOSPORIN) ointment Apply topically 3 (three) times daily. Apply to scalp wound. 06/21/17   Arrien, Jimmy Picket, MD  omeprazole (PRILOSEC) 20 MG capsule Take 20 mg by mouth daily.    [provider]  pramipexole (MIRAPEX) 0.125 MG tablet Take 0.125 mg by mouth at bedtime.    [provider]  sertraline (ZOLOFT) 50 MG tablet Take 50 mg by mouth daily.    [provider]  tamsulosin (FLOMAX) 0.4 MG CAPS capsule Take 0.4 mg by mouth daily as needed.     [provider]  traZODone (DESYREL) 100 MG tablet Take 200 mg by mouth at bedtime.    [provider]  triamcinolone ointment (KENALOG) 0.5 % Apply 1 application topically 2 (two) times daily. Apply to affected areas of forearms twice daily.    [provider]    Family History Family History  Family history unknown: Yes    Social History Social History   Tobacco Use  . Smoking status: Current Every Day Smoker    Packs/day: 0.50    Types: Cigarettes  . Smokeless tobacco: Never Used  Substance Use Topics  . Alcohol use: No  . Drug use: No  lives in a NH   Allergies   Bee venom; Penicillins; and Strawberry extract   Review of Systems Review of Systems  Unable to perform ROS: Dementia     Physical Exam Updated Vital Signs BP 114/64 (BP Location: Right Arm)   Pulse 75   Temp 98.8 F (37.1 C) (Oral)   Resp 17   Ht 6\' 2"  (1.88 m)   Wt 74.8 kg (165 lb)   SpO2 98%   BMI 21.18 kg/m   Vital signs normal    Physical Exam  Constitutional:  Non-toxic appearance. He does not appear ill. No distress.  Elderly frail male who is alert and cooperative  HENT:  Head: Normocephalic and atraumatic.  Right Ear: External ear normal.  Left Ear: External ear normal.  Nose: Nose normal. No mucosal edema or rhinorrhea.  Mouth/Throat: Oropharynx is clear and moist and mucous membranes are normal. No dental abscesses or uvula swelling.  Patient has a wound on the back of his scalp  that looks like it is a healing wound and the wound has almost healed over.  Patient is noted to have some dried blood on his lower lip.  Eyes: Pupils are equal, round, and reactive to light. Conjunctivae and EOM are normal.  Neck: Normal range of motion and full passive range of motion without pain. Neck supple.  Cardiovascular: Normal rate, regular rhythm and normal heart sounds. Exam reveals no gallop and no friction rub.  No murmur heard. Pulmonary/Chest: Effort normal and breath sounds normal. No respiratory distress. He has no wheezes. He has no rhonchi. He has no rales. He exhibits no tenderness  and no crepitus.  Abdominal: Soft. Normal appearance and bowel sounds are normal. He exhibits no distension. There is no tenderness. There is no rebound and no guarding.  Musculoskeletal: Normal range of motion. He exhibits no edema or tenderness.  Moves all extremities well.   Neurological: He is alert. He has normal strength. No cranial nerve deficit.  Patient does not know where he is, he was oriented that he was in [place] at [facility].  Skin: Skin is warm, dry and intact. No rash noted. No erythema. No pallor.  Psychiatric: He has a normal mood and affect. His speech is normal and behavior is normal. His mood appears not anxious.  Nursing note and vitals reviewed.    ED Treatments / Results  Labs (all labs ordered are listed, but only abnormal results are displayed) Results for orders placed or performed during the hospital encounter of 07/05/17  Comprehensive metabolic panel  Result Value Ref Range   Sodium 145 135 - 145 mmol/L   Potassium 4.2 3.5 - 5.1 mmol/L   Chloride 109 101 - 111 mmol/L   CO2 25 22 - 32 mmol/L   Glucose, Bld 106 (H) 65 - 99 mg/dL   BUN 41 (H) 6 - 20 mg/dL   Creatinine, Ser 1.43 (H) 0.61 - 1.24 mg/dL   Calcium 8.5 (L) 8.9 - 10.3 mg/dL   Total Protein 6.8 6.5 - 8.1 g/dL   Albumin 3.2 (L) 3.5 - 5.0 g/dL   AST 13 (L) 15 - 41 U/L   ALT 10 (L) 17 -  63 U/L   Alkaline Phosphatase 91 38 - 126 U/L   Total Bilirubin 0.6 0.3 - 1.2 mg/dL   GFR calc non Af Amer 45 (L) >60 mL/min   GFR calc Af Amer 53 (L) >60 mL/min   Anion gap 11 5 - 15  CBC with Differential  Result Value Ref Range   WBC 7.9 4.0 - 10.5 K/uL   RBC 4.01 (L) 4.22 - 5.81 MIL/uL   Hemoglobin 12.0 (L) 13.0 - 17.0 g/dL   HCT 38.4 (L) 39.0 - 52.0 %   MCV 95.8 78.0 - 100.0 fL   MCH 29.9 26.0 - 34.0 pg   MCHC 31.3 30.0 - 36.0 g/dL   RDW 15.2 11.5 - 15.5 %   Platelets 189 150 - 400 K/uL   Neutrophils Relative % 76 %   Neutro Abs 6.1 1.7 - 7.7 K/uL   Lymphocytes Relative 8 %   Lymphs Abs 0.6 (L) 0.7 - 4.0 K/uL   Monocytes Relative 12 %   Monocytes Absolute 0.9 0.1 - 1.0 K/uL   Eosinophils Relative 4 %   Eosinophils Absolute 0.3 0.0 - 0.7 K/uL   Basophils Relative 0 %   Basophils Absolute 0.0 0.0 - 0.1 K/uL  Lactic acid, plasma  Result Value Ref Range   Lactic Acid, Venous 1.2 0.5 - 1.9 mmol/L  Protime-INR  Result Value Ref Range   Prothrombin Time 13.7 11.4 - 15.2 seconds   INR 1.06   APTT  Result Value Ref Range   aPTT 63 (H) 24 - 36 seconds  POC occult blood, ED RN will collect  Result Value Ref Range   Fecal Occult Bld NEGATIVE NEGATIVE  Sample to Blood Bank  Result Value Ref Range   Blood Bank Specimen SAMPLE AVAILABLE FOR TESTING    Sample Expiration      07/07/2017 Performed at [facility], [address], [place], [place] [address]    Laboratory interpretation  all normal except stable anemia, mild increase in his usually elevated BUN    EKG None  Radiology No results found.  Procedures Procedures (including critical care time)  Medications Ordered in ED Medications  sodium chloride 0.9 % bolus 1,000 mL (1,000 mLs Intravenous New Bag/Given 07/06/17 0124)  pantoprazole (PROTONIX) injection 40 mg (40 mg Intravenous Given 07/06/17 0125)     Initial Impression / Assessment and Plan / ED Course  I have reviewed the triage vital signs and the  nursing notes.  Pertinent labs & imaging results that were available during my care of the patient were reviewed by me and considered in my medical decision making (see chart for details).     Patient was given IV fluids, he was also given a dose IV Protonix.  The nursing facility described coffee grounds.  He has had no episodes in the ED.  Looking at his medication list he is only on a baby aspirin once a day, he is not on any other nonsteroidal anti-inflammatory drugs.  He is also supposed to be on omeprazole.  Patient may have gastritis or a gastric ulcer although it would seem unusual to start suddenly.  He currently is asymptomatic.  I am going to talk to the hospitalist about overnight observation to make sure he did not have any other episodes.  01:57 AM Dr Manuella Ghazi, hospitalist, will admit.   Final Clinical Impressions(s) / ED Diagnoses   Final diagnoses:  Upper GI bleeding    Plan admission overnight for observation  Rolland Porter, MD, Barbette Or, MD 07/06/17 [phone]

## 2017-07-06 NOTE — Progress Notes (Signed)
07/05/2017 11:50 PM  07/06/2017 8:56 AM  Anthony [name] [name] was seen and examined.  The H&P by the admitting provider, orders, imaging was reviewed.  Please see new orders.  Will continue to follow.   Murvin Natal, MD [facility]

## 2017-07-06 NOTE — Progress Notes (Signed)
Brief EGD note:  8 cm long Barrett's esophagus with focal erythema and small telangiectasia but no active bleeding. Small sliding hiatal hernia. Bulbar and post bulbar duodenitis without stigmata of bleed. No evidence of Mallory-Weiss tear or peptic ulcer disease.

## 2017-07-06 NOTE — ED Notes (Signed)
POC occult blood was NEGATIVE

## 2017-07-06 NOTE — Consult Note (Addendum)
Reason for Consult: hematemesis Referring Physician: Hospitalist Anthony [name] is an 77 y.o. male.  HPI: Admitted thru the ED yesterday. States last night he cough and began to vomited coffee ground vomitus and also chocolate colored vomitus. Vomited approximately 3 times at home.Denies prior hx. No fever, abdominal pain, or chills. Per home medication: takes a baby ASA daily.  His appetite is good. No weight loss that he is aware of. He could not tell me what he ate for supper last night. He states his BMs are brown in color. No melena or BRRB. Has a BM x 2 daily.  POC fecal occult blood was negative. In triage at [facility], he vomited x 1 which was coffe ground in color   07/06/2017  Hemoglobin12.0  07/06/2008 5.30am Hemoglobin 10.8 Recently admitted to [facility] (April, 2019) with pneumonia. Has 5 children in good health. States he lives by himself at home, but records indicate he is a resident of [facility].     Past Medical History:  Diagnosis Date  . Acute pyelonephritis 12/23/2014  . Asthma   . BPH (benign prostatic hyperplasia)   . Chronic back pain   . Chronic hip pain   . COPD (chronic obstructive pulmonary disease) ([place])   . Dementia   . Depression   . GERD (gastroesophageal reflux disease)   . Kidney stone   . Neuropathy   . Pulmonary nodules 12/25/2014  . Restless leg syndrome     Past Surgical History:  Procedure Laterality Date  . ABDOMINAL SURGERY     heria  . EYE SURGERY      Family History  Family history unknown: Yes    Social History:  reports that he has been smoking cigarettes.  He has been smoking about 0.50 packs per day. He has never used smokeless tobacco. He reports that he does not drink alcohol or use drugs.  Allergies:    Medications: I have reviewed the patient's current medications.  Results for orders placed or performed during the hospital encounter of 07/05/17 (from the past 48 hour(s))  POC occult blood, ED RN will collect     Status: None   Collection Time: 07/06/17 12:25 AM  Result Value Ref Range   Fecal Occult Bld NEGATIVE NEGATIVE  Comprehensive metabolic panel     Status: Abnormal   Collection Time: 07/06/17 12:49 AM  Result Value Ref Range   Sodium 145 135 - 145 mmol/L   Potassium 4.2 3.5 - 5.1 mmol/L   Chloride 109 101 - 111 mmol/L   CO2 25 22 - 32 mmol/L   Glucose, Bld 106 (H) 65 - 99 mg/dL   BUN 41 (H) 6 - 20 mg/dL   Creatinine, Ser 1.43 (H) 0.61 - 1.24 mg/dL   Calcium 8.5 (L) 8.9 - 10.3 mg/dL   Total Protein 6.8 6.5 - 8.1 g/dL   Albumin 3.2 (L) 3.5 - 5.0 g/dL   AST 13 (L) 15 - 41 U/L   ALT 10 (L) 17 - 63 U/L   Alkaline Phosphatase 91 38 - 126 U/L   Total Bilirubin 0.6 0.3 - 1.2 mg/dL   GFR calc non Af Amer 45 (L) >60 mL/min   GFR calc Af Amer 53 (L) >60 mL/min    Comment: (NOTE) The eGFR has been calculated using the CKD EPI equation. This calculation has not been validated in all clinical situations. eGFR's persistently <60 mL/min signify possible Chronic Kidney Disease.    Anion gap 11 5 - 15    Comment:  Performed at [facility], [address], [place], [place] [address]  CBC with Differential     Status: Abnormal   Collection Time: 07/06/17 12:49 AM  Result Value Ref Range   WBC 7.9 4.0 - 10.5 K/uL   RBC 4.01 (L) 4.22 - 5.81 MIL/uL   Hemoglobin 12.0 (L) 13.0 - 17.0 g/dL   HCT 38.4 (L) 39.0 - 52.0 %   MCV 95.8 78.0 - 100.0 fL   MCH 29.9 26.0 - 34.0 pg   MCHC 31.3 30.0 - 36.0 g/dL   RDW 15.2 11.5 - 15.5 %   Platelets 189 150 - 400 K/uL   Neutrophils Relative % 76 %   Neutro Abs 6.1 1.7 - 7.7 K/uL   Lymphocytes Relative 8 %   Lymphs Abs 0.6 (L) 0.7 - 4.0 K/uL   Monocytes Relative 12 %   Monocytes Absolute 0.9 0.1 - 1.0 K/uL   Eosinophils Relative 4 %   Eosinophils Absolute 0.3 0.0 - 0.7 K/uL   Basophils Relative 0 %   Basophils Absolute 0.0 0.0 - 0.1 K/uL    Comment: Performed at [facility], [address] [place], [place] [address]  Lactic acid, plasma     Status: None   Collection  Time: 07/06/17 12:49 AM  Result Value Ref Range   Lactic Acid, Venous 1.2 0.5 - 1.9 mmol/L    Comment: Performed at [facility], [address], [place], [place] [address]  Sample to Blood Bank     Status: None   Collection Time: 07/06/17 12:49 AM  Result Value Ref Range   Blood Bank Specimen SAMPLE AVAILABLE FOR TESTING    Sample Expiration      07/07/2017 Performed at [facility], [address] [place], [place] [address]   Protime-INR     Status: None   Collection Time: 07/06/17 12:49 AM  Result Value Ref Range   Prothrombin Time 13.7 11.4 - 15.2 seconds   INR 1.06     Comment: Performed at [facility], [address], [place], [place] [address]  APTT     Status: Abnormal   Collection Time: 07/06/17 12:49 AM  Result Value Ref Range   aPTT 63 (H) 24 - 36 seconds    Comment:        IF BASELINE aPTT IS ELEVATED, SUGGEST PATIENT RISK ASSESSMENT BE USED TO DETERMINE APPROPRIATE ANTICOAGULANT THERAPY. Performed at [facility], [address], [place], [place] [address]   Hemoglobin     Status: Abnormal   Collection Time: 07/06/17  5:39 AM  Result Value Ref Range   Hemoglobin 10.8 (L) 13.0 - 17.0 g/dL    Comment: Performed at [facility], [address], [place], [place] [address]  Hematocrit     Status: Abnormal   Collection Time: 07/06/17  5:39 AM  Result Value Ref Range   HCT 34.7 (L) 39.0 - 52.0 %    Comment: Performed at [facility], [address] [place], [place] [address]    No results found.  ROS Blood pressure (!) 104/46, pulse (!) 55, temperature 97.7 F (36.5 C), temperature source Oral, resp. rate 18, height 6' 1" (1.854 m), weight 185 lb 10 oz (84.2 kg), SpO2 94 %. Physical Exam Alert and oriented. Skin warm and dry. Oral mucosa is moist. Dried blood to lower lips  . Sclera anicteric, conjunctivae is pink. Thyroid not enlarged. No cervical lymphadenopathy. Lungs clear. Heart regular rate and rhythm.  Abdomen is soft. Bowel sounds are positive. No  hepatomegaly.  No abdominal masses felt. No tenderness.  No edema to lower extremities.      Assessment/Plan: Upper GI bleed. Slight drop in hemoglobin.  Patient is hemodynamically stable.  Remote history of peptic ulcer disease. Possible esophagogastroduodenoscopy later today.  Terri L Setzer 07/06/2017, 10:07 AM

## 2017-07-06 NOTE — Care Management Obs Status (Signed)
[place] NOTIFICATION   Patient Details  Name: Derrill Bagnell MRN: [id number] Date of Birth: [birth year]   Medicare Observation Status Notification Given:  Yes    Baraa Tubbs, Chauncey Reading, RN 07/06/2017, 11:36 AM

## 2017-07-06 NOTE — Op Note (Signed)
[facility] Patient Name: Anthony [name] Procedure Date: 07/06/2017 2:51 PM MRN: [id number] Date of Birth: 06/11/1940 Attending MD: Hildred Laser , MD CSN: [id number] Age: 77 Admit Type: Inpatient Procedure:                Upper GI endoscopy Indications:              Coffee-ground emesis Providers:                Hildred Laser, MD, Janeece Riggers, RN, Nelma Rothman,                            Technician Referring MD:             Irwin Brakeman, MD Medicines:                Lidocaine spray, Midazolam 3 mg IV Complications:            No immediate complications. Estimated Blood Loss:     Estimated blood loss: none. Procedure:                Pre-Anesthesia Assessment:                           - Prior to the procedure, a History and Physical                            was performed, and patient medications and                            allergies were reviewed. The patient's tolerance of                            previous anesthesia was also reviewed. The risks                            and benefits of the procedure and the sedation                            options and risks were discussed with the patient.                            All questions were answered, and informed consent                            was obtained. Prior Anticoagulants: The patient                            last took aspirin 1 day prior to the procedure. ASA                            Grade Assessment: III - A patient with severe                            systemic disease. After reviewing the risks and  benefits, the patient was deemed in satisfactory                            condition to undergo the procedure.                           After obtaining informed consent, the endoscope was                            passed under direct vision. Throughout the                            procedure, the patient's blood pressure, pulse, and                            oxygen saturations  were monitored continuously. The                            [id number] ([id number]) scope was introduced through the                            mouth, and advanced to the second part of duodenum.                            The upper GI endoscopy was accomplished without                            difficulty. The patient tolerated the procedure                            well. Scope In: 5:02:56 PM Scope Out: 5:14:10 PM Total Procedure Duration: 0 hours 11 minutes 14 seconds  Findings:      The proximal esophagus and mid esophagus were normal.      There were esophageal mucosal changes consistent with long-segment       Barrett's esophagus present in the distal esophagus. The maximum       longitudinal extent of these mucosal changes was 8 cm in length.      The Z-line was irregular and was found 38 cm from the incisors.      A 2 cm hiatal hernia was present.      The entire examined stomach was normal.      Patchy mild inflammation characterized by congestion (edema), erythema       and granularity was found in the duodenal bulb and in the second portion       of the duodenum. Impression:               - Normal proximal esophagus and mid esophagus.                           - Esophageal mucosal changes consistent with                            long-segment Barrett's esophagus. Mucosa with  erythema and small telangiectasia.                           - Z-line irregular, 38 cm from the incisors.                           - 2 cm hiatal hernia.                           - Normal stomach.                           - Duodenitis.                           - No specimens collected.                           Comment; Biopsy not taken to confirm Barretts                            because of inflammation.                           No bleeding lesion identifired. Moderate Sedation:      Moderate (conscious) sedation was administered by the endoscopy nurse       and  supervised by the endoscopist. The following parameters were       monitored: oxygen saturation, heart rate, blood pressure, CO2       capnography and response to care. Total physician intraservice time was       17 minutes. Recommendation:           - Return patient to hospital ward for ongoing care.                           - Full liquid diet today.                           - Continue present medications.                           - H. Pylori serology.                           - No aspirin, ibuprofen, naproxen, or other                            non-steroidal anti-inflammatory drugs for 7 days.                           - Repeat upper endoscopy in 3 months. Procedure Code(s):        --- Professional ---                           [phone], Esophagogastroduodenoscopy, flexible,                            transoral; diagnostic, including  collection of                            specimen(s) by brushing or washing, when performed                            (separate procedure)                           G0500, Moderate sedation services provided by the                            same physician or other qualified health care                            professional performing a gastrointestinal                            endoscopic service that sedation supports,                            requiring the presence of an independent trained                            observer to assist in the monitoring of the                            patient's level of consciousness and physiological                            status; initial 15 minutes of intra-service time;                            patient age 71 years or older (additional time may                            be reported with [phone], as appropriate) Diagnosis Code(s):        --- Professional ---                           K22.8, Other specified diseases of esophagus                           K44.9, Diaphragmatic hernia without obstruction or                             gangrene                           K29.80, Duodenitis without bleeding                           K92.0, Hematemesis CPT copyright 2017 American Medical Association. All rights reserved. The codes documented in this report are preliminary and upon coder review may  be revised to meet current compliance requirements. Hildred Laser, MD Hildred Laser, MD 07/06/2017  5:29:39 PM This report has been signed electronically. Number of Addenda: 0

## 2017-07-06 NOTE — Plan of Care (Signed)
Pt resting in bed with eyes closed; no signs of distress. Will continue to monitor.

## 2017-07-06 NOTE — ED Notes (Signed)
Room 321

## 2017-07-06 NOTE — Clinical Social Work Note (Signed)
Patient was recently here on 4/8-9. (Full psychosocial assessment below). Tammy at [facility] indicates that patient can return to the facility at discharge. A message was left for Netta Cedars at [facility] who is patient's guardian.   Clinical Social Work Assessment  Patient Details  Name: Anthony [name] MRN: [id number] Date of Birth: 02-Oct-1940  Date of referral:  06/20/17               Reason for consult:  Facility Placement                 Permission sought to share information with:    Permission granted to share information::                Name::                   Agency::  Tammy at [facility]             Relationship::                Contact Information:     Housing/Transportation Living arrangements for the past 2 months:  [place] of Information:  Facility Patient Interpreter Needed:  None Criminal Activity/Legal Involvement Pertinent to Current Situation/Hospitalization:  No - Comment as needed Significant Relationships:  Other(Comment)([place]) Lives with:  Facility Resident Do you feel safe going back to the place where you live?  Yes Need for family participation in patient care:  Yes (Comment)  Care giving concerns:  Facility staff assist with ADLs including bathing, grooming, toileting. Patient can feed himself.    Social Worker assessment / plan:  Patient has been a resident at [facility] since 10/20/16.  His guardian is Netta Cedars with [facility] (Tammy left her a message regarding hospitalization). Patient uses a wheelchair and can help with transfers.  Patient does not walk by himself. Patient is active with [facility] receiving therapy and nursing.  He can return to the facility at discharge.  LCSW advised Tammy that patient is expected to discharge on 06/21/17.  LCSW will follow up with guardian regarding diagnosis, treatment and prognosis.   Employment status:  Retired Forensic scientist:  Information systems manager, [employer] [place] [employer]  Recommendations:  Not assessed at this time Information / Referral to community resources:     Patient/Family's Response to care:  Patient's guardian is aware of patient's hospitalization.   Patient/Family's Understanding of and Emotional Response to Diagnosis, Current Treatment, and Prognosis:  LCSW will follow up with patient's guardian regarding diagnosis, treatment and prognosis.   Emotional Assessment Appearance:  Appears stated age Attitude/Demeanor/Rapport:    Affect (typically observed):  Calm Orientation:  Oriented to Self, Oriented to Place, Oriented to  Time, Oriented to Situation Alcohol / Substance use:  Not Applicable Psych involvement (Current and /or in the community):  No (Comment)  Discharge Needs  Concerns to be addressed:  Other (Comment Required(return to [facility]) Readmission within the last 30 days:  No Current discharge risk:  None Barriers to Discharge:  No Barriers Identified   Ihor Gully, LCSW 06/20/2017, 4:13 PM

## 2017-07-07 DIAGNOSIS — K920 Hematemesis: Secondary | ICD-10-CM | POA: Diagnosis not present

## 2017-07-07 DIAGNOSIS — D649 Anemia, unspecified: Secondary | ICD-10-CM | POA: Diagnosis not present

## 2017-07-07 DIAGNOSIS — K22719 Barrett's esophagus with dysplasia, unspecified: Secondary | ICD-10-CM

## 2017-07-07 DIAGNOSIS — F0390 Unspecified dementia without behavioral disturbance: Secondary | ICD-10-CM | POA: Diagnosis not present

## 2017-07-07 DIAGNOSIS — N183 Chronic kidney disease, stage 3 (moderate): Secondary | ICD-10-CM | POA: Diagnosis not present

## 2017-07-07 DIAGNOSIS — J410 Simple chronic bronchitis: Secondary | ICD-10-CM | POA: Diagnosis not present

## 2017-07-07 DIAGNOSIS — K2270 Barrett's esophagus without dysplasia: Secondary | ICD-10-CM | POA: Diagnosis not present

## 2017-07-07 LAB — CBC
HEMATOCRIT: 32.9 % — AB (ref 39.0–52.0)
HEMOGLOBIN: 10.4 g/dL — AB (ref 13.0–17.0)
MCH: 30.4 pg (ref 26.0–34.0)
MCHC: 31.6 g/dL (ref 30.0–36.0)
MCV: 96.2 fL (ref 78.0–100.0)
Platelets: 159 10*3/uL (ref 150–400)
RBC: 3.42 MIL/uL — ABNORMAL LOW (ref 4.22–5.81)
RDW: 15.4 % (ref 11.5–15.5)
WBC: 3.4 10*3/uL — AB (ref 4.0–10.5)

## 2017-07-07 LAB — MRSA PCR SCREENING: MRSA BY PCR: NEGATIVE

## 2017-07-07 MED ORDER — PANTOPRAZOLE SODIUM 40 MG PO TBEC: 40.0000 mg | DELAYED_RELEASE_TABLET | Freq: Two times a day (BID) | ORAL | Status: DC

## 2017-07-07 MED ORDER — ALBUTEROL SULFATE (2.5 MG/3ML) 0.083% IN NEBU: 2.5000 mg | INHALATION_SOLUTION | RESPIRATORY_TRACT | 12 refills | Status: DC | PRN

## 2017-07-07 MED ORDER — IPRATROPIUM BROMIDE 0.02 % IN SOLN: 0.5000 mg | Freq: Four times a day (QID) | RESPIRATORY_TRACT | 12 refills | Status: DC | PRN

## 2017-07-07 MED ORDER — TRAMADOL HCL 50 MG PO TABS: 50.0000 mg | ORAL_TABLET | Freq: Four times a day (QID) | ORAL | 0 refills | Status: DC | PRN

## 2017-07-07 NOTE — Discharge Summary (Signed)
Physician Discharge Summary  Anthony [name] XTK:[id number] DOB: 10/28/1940 DOA: 07/05/2017  PCP: Rosita Fire, MD GI: Dr. Laural Golden  Admit date: 07/05/2017 Discharge date: 07/07/2017  Disposition: Return to [facility]  Recommendations for Outpatient Follow-up:  1. Follow up with PCP in 1 weeks 2. Follow up with Dr. Laural Golden GI in 1 month 3. Repeat endoscopy in 3 months 4. Please obtain BMP/CBC in one week 5. Please follow up on the following pending results: biopsy results  Discharge Condition: STABLE   CODE STATUS: FULL    Brief Hospitalization Summary: Please see all hospital notes, images, labs for full details of the hospitalization.  HPI: Anthony [name] is a 77 y.o. male with medical history significant for COPD, CKD stage III, and dementia who states that he had epigastric abdominal pain that developed earlier this evening and was associated with belching.  He states that he had an episode of vomiting with dark, brown emesis and staff reported this as appearing coffee-ground in appearance.  He currently states that after the episode of emesis he began feeling much better and denies any further symptomatology such as further nausea. He denies any fever, chills, lightheadedness, dizziness, diarrhea, dark/tarry stools, or blood in stools.   ED Course: Vital signs are stable and laboratory data remarkable for BUN 41 and creatinine 1.43, this is near his baseline as he does have stage III CKD.  He does have anemia that appears to be stable with hemoglobin 12.  1. Coffee ground emesis - Pt found to have Barrett's Esophagus on EGD by Dr. Laural Golden.  He was started on protonix.  He tolerated full liquid diet and now on soft diet.  He should follow up with Dr. Laural Golden and planning on having repeat endoscopy in 3 months.  Biopsies were taken by Dr. Laural Golden.  He should follow up with Dr. Laural Golden for the results.  Repeat H&H has been stable. 2. COPD.  No active bronchospasm.  Duonebs as needed for shortness  of breath or wheezing. 3. CKD stage III.  Currently at baseline.  Monitor with repeat labs outpatient as needed for surveillance. 4. Dementia.  Continue Namenda.  DVT prophylaxis: SCDs Code Status: Full Family Communication: patient Disposition Plan:Return to ALF Consults called:GI Admission status: Obs, med-surg  Discharge Diagnoses:  Principal Problem:   Hematemesis Active Problems:   Normocytic anemia   COPD (chronic obstructive pulmonary disease) (HCC)   CKD (chronic kidney disease) stage 3, GFR 30-59 ml/min (HCC)   Dementia  Discharge Instructions: Discharge Instructions    Call MD for:  difficulty breathing, headache or visual disturbances   Complete by:  As directed    Call MD for:  extreme fatigue   Complete by:  As directed    Call MD for:  persistant dizziness or light-headedness   Complete by:  As directed    Call MD for:  persistant nausea and vomiting   Complete by:  As directed    Call MD for:  redness, tenderness, or signs of infection (pain, swelling, redness, odor or green/yellow discharge around incision site)   Complete by:  As directed    Call MD for:  severe uncontrolled pain   Complete by:  As directed    Discharge instructions   Complete by:  As directed    PT NEEDS TO FOLLOW UP WITH DR [facility] IN 3 MONTHS FOR REPEAT ENDOSCOPY.   Increase activity slowly   Complete by:  As directed         Strawberry Extract Rash  Medication List    STOP taking these medications   doxycycline 100 MG tablet Commonly known as:  VIBRA-TABS   omeprazole 20 MG capsule Commonly known as:  PRILOSEC     TAKE these medications   acetaminophen 500 MG tablet Commonly known as:  TYLENOL Take 500 mg every 8 (eight) hours as needed by mouth for mild pain.   albuterol (2.5 MG/3ML) 0.083% nebulizer solution Commonly known as:  PROVENTIL Take 3 mLs (2.5 mg total) by nebulization every 4 (four) hours as needed for wheezing or shortness of breath. What changed:   when to take this   albuterol 108 (90 Base) MCG/ACT inhaler Commonly known as:  PROVENTIL HFA;VENTOLIN HFA Inhale 2 puffs into the lungs every 4 (four) hours as needed for wheezing or shortness of breath. What changed:  Another medication with the same name was changed. Make sure you understand how and when to take each.   aspirin EC 81 MG tablet Take 81 mg by mouth daily.   baclofen 10 MG tablet Commonly known as:  LIORESAL Take 10 mg by mouth 2 (two) times daily.   buPROPion 150 MG 12 hr tablet Commonly known as:  WELLBUTRIN SR Take 150 mg by mouth 2 (two) times daily.   CALCIUM CITRATE+D3 PETITES 200-250 MG-UNIT Tabs Generic drug:  Calcium Citrate-Vitamin D Take 1 tablet by mouth daily.   cholecalciferol 1000 units tablet Commonly known as:  VITAMIN D Take 2,000 Units by mouth daily.   fluticasone 50 MCG/ACT nasal spray Commonly known as:  FLONASE Place 1 spray 2 (two) times daily into both nostrils.   Fluticasone-Salmeterol 250-50 MCG/DOSE Aepb Commonly known as:  ADVAIR Inhale 1 puff 2 (two) times daily into the lungs.   gabapentin 400 MG capsule Commonly known as:  NEURONTIN Take 400 mg by mouth 3 (three) times daily.   ipratropium 0.02 % nebulizer solution Commonly known as:  ATROVENT Take 2.5 mLs (0.5 mg total) by nebulization every 6 (six) hours as needed for wheezing or shortness of breath. What changed:    when to take this  reasons to take this   memantine 10 MG tablet Commonly known as:  NAMENDA Take 10 mg by mouth 2 (two) times daily.   mupirocin ointment 2 % Commonly known as:  BACTROBAN Apply 1 application topically 2 (two) times daily.   neomycin-bacitracin-polymyxin ointment Commonly known as:  NEOSPORIN Apply topically 3 (three) times daily. Apply to scalp wound.   pantoprazole 40 MG tablet Commonly known as:  PROTONIX Take 1 tablet (40 mg total) by mouth 2 (two) times daily before a meal.   pramipexole 0.125 MG tablet Commonly known  as:  MIRAPEX Take 0.125 mg by mouth at bedtime.   sennosides-docusate sodium 8.6-50 MG tablet Commonly known as:  SENOKOT-S Take 1 tablet by mouth daily as needed for constipation.   sertraline 50 MG tablet Commonly known as:  ZOLOFT Take 50 mg by mouth daily.   tamsulosin 0.4 MG Caps capsule Commonly known as:  FLOMAX Take 0.4 mg by mouth daily as needed.   traMADol 50 MG tablet Commonly known as:  ULTRAM Take 1 tablet (50 mg total) by mouth every 6 (six) hours as needed for severe pain. What changed:    how much to take  reasons to take this   traZODone 100 MG tablet Commonly known as:  DESYREL Take 200 mg by mouth at bedtime.   triamcinolone ointment 0.5 % Commonly known as:  KENALOG Apply 1 application topically 2 (two)  times daily. Apply to affected areas of forearms twice daily.   vitamin B-12 1000 MCG tablet Commonly known as:  CYANOCOBALAMIN Take 1,000 mcg by mouth daily.   Vitamin D (Ergocalciferol) 50000 units Caps capsule Commonly known as:  DRISDOL Take 50,000 Units by mouth every 7 (seven) days.      Follow-up Information    Rosita Fire, MD. Schedule an appointment as soon as possible for a visit in 1 week(s).   Specialty:  Internal Medicine Why:  Hospital Follow Up  Contact information: [place] [id number] [phone]        Rogene Houston, MD. Schedule an appointment as soon as possible for a visit in 1 month(s).   Specialty:  Gastroenterology Why:  Hospital Follow Up  Contact information: [address], [address] [place] [address] [phone]          Allergies  Allergen Reactions  . Bee Venom Anaphylaxis  . Penicillins Hives    Has patient had a PCN reaction causing immediate rash, facial/tongue/throat swelling, SOB or lightheadedness with hypotension: NO Has patient had a PCN reaction causing severe rash involving mucus membranes or skin necrosis: NO Has patient had a PCN reaction that  required hospitalization NO Has patient had a PCN reaction occurring within the last 10 years:NO If all of the above answers are "NO", then may proceed with Cephalosporin use.   . Strawberry Extract Rash   Allergies as of 07/07/2017      Reactions   Bee Venom Anaphylaxis   Penicillins Hives   Has patient had a PCN reaction causing immediate rash, facial/tongue/throat swelling, SOB or lightheadedness with hypotension: NO Has patient had a PCN reaction causing severe rash involving mucus membranes or skin necrosis: NO Has patient had a PCN reaction that required hospitalization NO Has patient had a PCN reaction occurring within the last 10 years: }NO If all of the above answers are "NO", then may proceed with Cephalosporin use.   Strawberry Extract Rash      Medication List    STOP taking these medications   doxycycline 100 MG tablet Commonly known as:  VIBRA-TABS   omeprazole 20 MG capsule Commonly known as:  PRILOSEC     TAKE these medications   acetaminophen 500 MG tablet Commonly known as:  TYLENOL Take 500 mg every 8 (eight) hours as needed by mouth for mild pain.   albuterol (2.5 MG/3ML) 0.083% nebulizer solution Commonly known as:  PROVENTIL Take 3 mLs (2.5 mg total) by nebulization every 4 (four) hours as needed for wheezing or shortness of breath. What changed:  when to take this   albuterol 108 (90 Base) MCG/ACT inhaler Commonly known as:  PROVENTIL HFA;VENTOLIN HFA Inhale 2 puffs into the lungs every 4 (four) hours as needed for wheezing or shortness of breath. What changed:  Another medication with the same name was changed. Make sure you understand how and when to take each.   aspirin EC 81 MG tablet Take 81 mg by mouth daily.   baclofen 10 MG tablet Commonly known as:  LIORESAL Take 10 mg by mouth 2 (two) times daily.   buPROPion 150 MG 12 hr tablet Commonly known as:  WELLBUTRIN SR Take 150 mg by mouth 2 (two) times daily.   CALCIUM CITRATE+D3 PETITES  200-250 MG-UNIT Tabs Generic drug:  Calcium Citrate-Vitamin D Take 1 tablet by mouth daily.   cholecalciferol 1000 units tablet Commonly known as:  VITAMIN D Take 2,000 Units by  mouth daily.   fluticasone 50 MCG/ACT nasal spray Commonly known as:  FLONASE Place 1 spray 2 (two) times daily into both nostrils.   Fluticasone-Salmeterol 250-50 MCG/DOSE Aepb Commonly known as:  ADVAIR Inhale 1 puff 2 (two) times daily into the lungs.   gabapentin 400 MG capsule Commonly known as:  NEURONTIN Take 400 mg by mouth 3 (three) times daily.   ipratropium 0.02 % nebulizer solution Commonly known as:  ATROVENT Take 2.5 mLs (0.5 mg total) by nebulization every 6 (six) hours as needed for wheezing or shortness of breath. What changed:    when to take this  reasons to take this   memantine 10 MG tablet Commonly known as:  NAMENDA Take 10 mg by mouth 2 (two) times daily.   mupirocin ointment 2 % Commonly known as:  BACTROBAN Apply 1 application topically 2 (two) times daily.   neomycin-bacitracin-polymyxin ointment Commonly known as:  NEOSPORIN Apply topically 3 (three) times daily. Apply to scalp wound.   pantoprazole 40 MG tablet Commonly known as:  PROTONIX Take 1 tablet (40 mg total) by mouth 2 (two) times daily before a meal.   pramipexole 0.125 MG tablet Commonly known as:  MIRAPEX Take 0.125 mg by mouth at bedtime.   sennosides-docusate sodium 8.6-50 MG tablet Commonly known as:  SENOKOT-S Take 1 tablet by mouth daily as needed for constipation.   sertraline 50 MG tablet Commonly known as:  ZOLOFT Take 50 mg by mouth daily.   tamsulosin 0.4 MG Caps capsule Commonly known as:  FLOMAX Take 0.4 mg by mouth daily as needed.   traMADol 50 MG tablet Commonly known as:  ULTRAM Take 1 tablet (50 mg total) by mouth every 6 (six) hours as needed for severe pain. What changed:    how much to take  reasons to take this   traZODone 100 MG tablet Commonly known as:   DESYREL Take 200 mg by mouth at bedtime.   triamcinolone ointment 0.5 % Commonly known as:  KENALOG Apply 1 application topically 2 (two) times daily. Apply to affected areas of forearms twice daily.   vitamin B-12 1000 MCG tablet Commonly known as:  CYANOCOBALAMIN Take 1,000 mcg by mouth daily.   Vitamin D (Ergocalciferol) 50000 units Caps capsule Commonly known as:  DRISDOL Take 50,000 Units by mouth every 7 (seven) days.       Procedures/Studies: Dg Chest Port 1 View  Result Date: 06/19/2017 CLINICAL DATA:  Fever. EXAM: PORTABLE CHEST 1 VIEW COMPARISON:  Right humerus radiographs 12/24/2016, CXR 12/24/2016 FINDINGS: Top-normal size heart with aortic atherosclerosis. Patchy airspace opacity at the right lung base consistent with pneumonia. No overt edema. Redemonstration of mixed osteolytic and sclerotic lesion in the proximal diaphysis of the right humerus without pathologic fracture. Findings may represent a chronic cartilaginous lesion. If the patient has pain referable to the right humerus, non emergent MRI may help for further correlation. IMPRESSION: Right lower lobe pulmonary consolidation consistent with pneumonia. Stable mixed osteolytic and sclerotic lesion of the proximal right humeral diaphysis. Electronically Signed   By: Ashley Royalty M.D.   On: 06/19/2017 01:25   Dg Femur Portable 1 View Left  Result Date: 06/10/2017 CLINICAL DATA:  77 year old male and fall.  Left hip fracture. EXAM: LEFT FEMUR PORTABLE 1 VIEW COMPARISON:  Bilateral hip radiograph dated 06/10/2017 and pelvic MRI dated 03/04/2017 FINDINGS: No definite fracture noted on the single provided view of the hip. Foreshortened appearance of the left femoral neck, likely positional. The bones are osteopenic.  Mild arthritic changes of the left knee. IMPRESSION: No definite acute fracture on single view. Electronically Signed   By: Anner Crete M.D.   On: 06/10/2017 06:37   Dg Femur Portable 1 View Right  Result  Date: 06/10/2017 CLINICAL DATA:  77 year old male with fall and right hip pain. EXAM: RIGHT FEMUR PORTABLE 1 VIEW COMPARISON:  Right hip radiograph dated 08/21/2016 and MRI dated 03/04/2017 and CT of the right hip dated 01/30/2017 FINDINGS: Evaluation is limited due to osteopenia and patient's body habitus. There is no acute fracture or dislocation on the provided images. Fracture of the superior portion of the greater trochanter of the right femur similar to prior CT. Right femoral intramedullary rod and transcervical screw appears intact. There is mild arthritic changes of the knee. IMPRESSION: No acute/new fracture. Fracture of the superior portion of the greater trochanter appears similar to the prior CT. Electronically Signed   By: Anner Crete M.D.   On: 06/10/2017 06:34   Dg Hips Bilat With Pelvis Min 5 Views  Result Date: 06/10/2017 CLINICAL DATA:  77 year old male status post fall from standing. Right greater than left hip pain. EXAM: DG HIP (WITH OR WITHOUT PELVIS) 5+V BILAT COMPARISON:  Right hip series and CT 01/30/2017 and earlier. FINDINGS: AP supine view of the pelvis. Femoral heads remain normally located. Hip joint spaces are stable. The pelvis appears stable and intact. The proximal left femur appears stable and intact. Sequelae of right femur ORIF re-demonstrated with a stable appearance of the visible right femur intramedullary rod with interlocking hip screw and more distal interlocking cortical screw. The entire intramedullary rod is not included, but the visible hardware appears intact. Unchanged appearance of the right greater trochanter since 2018. No acute right hip fracture identified. IMPRESSION: No acute fracture or dislocation identified about the bilateral hips or pelvis. Previous right femur ORIF, the visible hardware is stable. Electronically Signed   By: Genevie Ann M.D.   On: 06/10/2017 01:26      Subjective: Pt has had no further emesis.  No complaints.    Discharge  Exam: Vitals:   07/07/17 0504 07/07/17 0810  BP: (!) 104/54   Pulse: (!) 48   Resp:    Temp: 98.1 F (36.7 C)   SpO2: 96% 96%   Vitals:   07/06/17 2030 07/06/17 2200 07/07/17 0504 07/07/17 0810  BP:  (!) 100/53 (!) 104/54   Pulse:  67 (!) 48   Resp:  20    Temp:  98.3 F (36.8 C) 98.1 F (36.7 C)   TempSrc:  Oral Oral   SpO2: 96% 96% 96% 96%  Weight:      Height:        General: Pt is alert, awake, not in acute distress Cardiovascular: RRR, S1/S2 +, no rubs, no gallops Respiratory: CTA bilaterally, no wheezing, no rhonchi Abdominal: Soft, NT, ND, bowel sounds + Extremities: no edema, no cyanosis   The results of significant diagnostics from this hospitalization (including imaging, microbiology, ancillary and laboratory) are listed below for reference.     Microbiology: Recent Results (from the past 240 hour(s))  MRSA PCR Screening     Status: None   Collection Time: 07/06/17 10:24 PM  Result Value Ref Range Status   MRSA by PCR NEGATIVE NEGATIVE Final    Comment:        The GeneXpert MRSA Assay (FDA approved for NASAL specimens only), is one component of a comprehensive MRSA colonization surveillance program. It is not intended to  diagnose MRSA infection nor to guide or monitor treatment for MRSA infections. Performed at [facility], [address], [place], [place] [address]      Labs: BNP (last 3 results) No results for input(s): BNP in the last 8760 hours. Basic Metabolic Panel: Recent Labs  Lab 07/06/17 0049  NA 145  K 4.2  CL 109  CO2 25  GLUCOSE 106*  BUN 41*  CREATININE 1.43*  CALCIUM 8.5*   Liver Function Tests: Recent Labs  Lab 07/06/17 0049  AST 13*  ALT 10*  ALKPHOS 91  BILITOT 0.6  PROT 6.8  ALBUMIN 3.2*   No results for input(s): LIPASE, AMYLASE in the last 168 hours. No results for input(s): AMMONIA in the last 168 hours. CBC: Recent Labs  Lab 07/06/17 0049 07/06/17 0539 07/06/17 1201 07/07/17 0347  WBC 7.9  --    --  3.4*  NEUTROABS 6.1  --   --   --   HGB 12.0* 10.8* 11.0* 10.4*  HCT 38.4* 34.7* 35.0* 32.9*  MCV 95.8  --   --  96.2  PLT 189  --   --  159   Cardiac Enzymes: No results for input(s): CKTOTAL, CKMB, CKMBINDEX, TROPONINI in the last 168 hours. BNP: Invalid input(s): POCBNP CBG: No results for input(s): GLUCAP in the last 168 hours. D-Dimer No results for input(s): DDIMER in the last 72 hours. Hgb A1c No results for input(s): HGBA1C in the last 72 hours. Lipid Profile No results for input(s): CHOL, HDL, LDLCALC, TRIG, CHOLHDL, LDLDIRECT in the last 72 hours. Thyroid function studies No results for input(s): TSH, T4TOTAL, T3FREE, THYROIDAB in the last 72 hours.  Invalid input(s): FREET3 Anemia work up No results for input(s): VITAMINB12, FOLATE, FERRITIN, TIBC, IRON, RETICCTPCT in the last 72 hours. Urinalysis    Component Value Date/Time   COLORURINE STRAW (A) 06/19/2017 [place] 06/19/2017 0432   LABSPEC 1.006 06/19/2017 0432   PHURINE 6.0 06/19/2017 0432   GLUCOSEU NEGATIVE 06/19/2017 0432   HGBUR NEGATIVE 06/19/2017 0432   BILIRUBINUR NEGATIVE 06/19/2017 0432   KETONESUR NEGATIVE 06/19/2017 0432   PROTEINUR NEGATIVE 06/19/2017 0432   UROBILINOGEN 1.0 12/23/2014 1853   NITRITE NEGATIVE 06/19/2017 0432   LEUKOCYTESUR NEGATIVE 06/19/2017 0432   Sepsis Labs Invalid input(s): PROCALCITONIN,  WBC,  LACTICIDVEN Microbiology Recent Results (from the past 240 hour(s))  MRSA PCR Screening     Status: None   Collection Time: 07/06/17 10:24 PM  Result Value Ref Range Status   MRSA by PCR NEGATIVE NEGATIVE Final    Comment:        The GeneXpert MRSA Assay (FDA approved for NASAL specimens only), is one component of a comprehensive MRSA colonization surveillance program. It is not intended to diagnose MRSA infection nor to guide or monitor treatment for MRSA infections. Performed at [facility], [address], [place],  [address]    Time  coordinating discharge:   SIGNED:  Irwin Brakeman, MD  [facility] 07/07/2017, 10:33 AM Pager [phone]  If 7PM-7AM, please contact night-coverage [email or web address] Password [email or web address]

## 2017-07-07 NOTE — Progress Notes (Signed)
Staff from [facility] given discharge papers with followup instructions and script for tramadol.

## 2017-07-07 NOTE — [place] FL2 (Signed)
[place] MEDICAID FL2 LEVEL OF CARE SCREENING TOOL     IDENTIFICATION  Patient Name: Anthony [name] Birthdate: [birth year] Sex: male Admission Date (Current Location): 07/05/2017  [facility] and [place] Number:  [employer] and Address:  [place] [address], [place]      Provider Number: [id number]  Attending Physician Name and Address:  Murlean Iba, MD  Relative Name and Phone Number:       Current Level of Care: Other (Comment)(Patient is in observation) Recommended Level of Care: [place] Prior Approval Number:    Date Approved/Denied:   PASRR Number:    Discharge Plan: Other (Comment)([facility])    Current Diagnoses: Patient Active Problem List   Diagnosis Date Noted  . Hematemesis 07/06/2017  . Normochromic normocytic anemia 06/19/2017  . CKD (chronic kidney disease) stage 3, GFR 30-59 ml/min (HCC) 06/19/2017  . Dementia 06/19/2017  . Decubitus ulcer 06/19/2017  . Open wnd of scalp   . S/p left hip fracture   . HCAP (healthcare-associated pneumonia) 10/31/2016  . CKD (chronic kidney disease) 10/31/2016  . COPD (chronic obstructive pulmonary disease) ([place]) 10/31/2016  . Hyponatremia 12/25/2014  . Hypokalemia 12/25/2014  . Normocytic anemia 12/25/2014  . Pulmonary nodules 12/25/2014  . Tobacco abuse 12/25/2014  . ARF (acute renal failure) ([place]) 12/24/2014  . Acute pyelonephritis 12/23/2014    Orientation RESPIRATION BLADDER Height & Weight     Self, Time, Situation, Place  Normal Incontinent Weight: 185 lb 10 oz (84.2 kg) Height:  6\' 1"  (185.4 cm)  BEHAVIORAL SYMPTOMS/MOOD NEUROLOGICAL BOWEL NUTRITION STATUS      Incontinent Diet(Soft heart healthy which the facility indicates equates to their regular diet. )  AMBULATORY STATUS COMMUNICATION OF NEEDS Skin   Total Care(wheelchair) Verbally PU Stage and Appropriate Care, Other (Comment)(Stage I: Penis. Head posterior)                        Personal Care Assistance Level of Assistance  Bathing, Feeding, Dressing Bathing Assistance: Limited assistance Feeding assistance: Independent Dressing Assistance: Limited assistance     Functional Limitations Info  Sight, Hearing, Speech Sight Info: Adequate Hearing Info: Adequate Speech Info: Adequate    SPECIAL CARE FACTORS FREQUENCY                       Contractures Contractures Info: Not present    Additional Factors Info  Code Status, Allergies, Psychotropic Code Status Info: Full Code Allergies Info: Bee Venom, Penicillins, Strawberry Extract Psychotropic Info: Wellbutrin, Zoloft, Desyrel         Current Medications (07/07/2017):  This is the current hospital active medication list Current Facility-Administered Medications  Medication Dose Route Frequency Provider Last Rate Last Dose  . 0.9 %  sodium chloride infusion  250 mL Intravenous PRN Manuella Ghazi, Pratik D, DO      . acetaminophen (TYLENOL) tablet 650 mg  650 mg Oral Q6H PRN Heath Lark D, DO   650 mg at 07/06/17 1149   Or  . acetaminophen (TYLENOL) suppository 650 mg  650 mg Rectal Q6H PRN Manuella Ghazi, Pratik D, DO      . baclofen (LIORESAL) tablet 10 mg  10 mg Oral BID Manuella Ghazi, Pratik D, DO   10 mg at 07/07/17 0930  . buPROPion [facility] SR) 12 hr tablet 150 mg  150 mg Oral BID Manuella Ghazi, Pratik D, DO   150 mg at 07/07/17 0929  . calcium-vitamin D (OSCAL  WITH D) 500-200 MG-UNIT per tablet 1 tablet  1 tablet Oral Daily Manuella Ghazi, Pratik D, DO   1 tablet at 07/07/17 0930  . cholecalciferol (VITAMIN D) tablet 2,000 Units  2,000 Units Oral Daily Manuella Ghazi, Pratik D, DO   2,000 Units at 07/07/17 0930  . famotidine (PEPCID) IVPB 20 mg premix  20 mg Intravenous Q12H Shah, Pratik D, DO   Stopped at 07/07/17 1011  . fluticasone (FLONASE) 50 MCG/ACT nasal spray 1 spray  1 spray Each Nare BID Manuella Ghazi, Pratik D, DO   1 spray at 07/07/17 0931  . gabapentin (NEURONTIN) capsule 400 mg  400 mg Oral TID Manuella Ghazi, Pratik D, DO   400 mg at 07/07/17  0940  . ipratropium-albuterol (DUONEB) 0.5-2.5 (3) MG/3ML nebulizer solution 3 mL  3 mL Nebulization Q6H PRN Manuella Ghazi, Pratik D, DO      . MEDLINE mouth rinse  15 mL Mouth Rinse BID Manuella Ghazi, Pratik D, DO   15 mL at 07/07/17 0930  . memantine (NAMENDA) tablet 10 mg  10 mg Oral BID Manuella Ghazi, Pratik D, DO   10 mg at 07/07/17 0930  . mometasone-formoterol (DULERA) 200-5 MCG/ACT inhaler 2 puff  2 puff Inhalation BID Manuella Ghazi, Pratik D, DO   2 puff at 07/07/17 0810  . mupirocin cream (BACTROBAN) 2 %   Topical BID Johnson, Clanford L, MD      . ondansetron (ZOFRAN) tablet 4 mg  4 mg Oral Q6H PRN Manuella Ghazi, Pratik D, DO       Or  . ondansetron (ZOFRAN) injection 4 mg  4 mg Intravenous Q6H PRN Manuella Ghazi, Pratik D, DO      . pramipexole (MIRAPEX) tablet 0.125 mg  0.125 mg Oral QHS Shah, Pratik D, DO   0.125 mg at 07/06/17 2207  . sertraline (ZOLOFT) tablet 50 mg  50 mg Oral Daily Manuella Ghazi, Pratik D, DO   50 mg at 07/07/17 0930  . sodium chloride flush (NS) 0.9 % injection 3 mL  3 mL Intravenous Q12H Shah, Pratik D, DO   3 mL at 07/07/17 0933  . sodium chloride flush (NS) 0.9 % injection 3 mL  3 mL Intravenous PRN Manuella Ghazi, Pratik D, DO      . tamsulosin (FLOMAX) capsule 0.4 mg  0.4 mg Oral Daily Manuella Ghazi, Pratik D, DO   0.4 mg at 07/07/17 0933  . traZODone (DESYREL) tablet 200 mg  200 mg Oral QHS Shah, Pratik D, DO   200 mg at 07/06/17 2208  . triamcinolone ointment (KENALOG) 0.5 % 1 application  1 application Topical BID Heath Lark D, DO   1 application at 52/84/13 [phone]  . vitamin B-12 (CYANOCOBALAMIN) tablet 1,000 mcg  1,000 mcg Oral Daily Manuella Ghazi, Pratik D, DO   1,000 mcg at 07/07/17 0930  . white petrolatum (VASELINE) gel   Topical Daily Johnson, Clanford L, MD         Discharge Medications:   Medication List    STOP taking these medications   doxycycline 100 MG tablet Commonly known as:  VIBRA-TABS   omeprazole 20 MG capsule Commonly known as:  PRILOSEC     TAKE these medications   acetaminophen 500 MG tablet Commonly  known as:  TYLENOL Take 500 mg every 8 (eight) hours as needed by mouth for mild pain.   albuterol (2.5 MG/3ML) 0.083% nebulizer solution Commonly known as:  PROVENTIL Take 3 mLs (2.5 mg total) by nebulization every 4 (four) hours as needed for wheezing or shortness of breath. What changed:  when  to take this   albuterol 108 (90 Base) MCG/ACT inhaler Commonly known as:  PROVENTIL HFA;VENTOLIN HFA Inhale 2 puffs into the lungs every 4 (four) hours as needed for wheezing or shortness of breath. What changed:  Another medication with the same name was changed. Make sure you understand how and when to take each.   aspirin EC 81 MG tablet Take 81 mg by mouth daily.   baclofen 10 MG tablet Commonly known as:  LIORESAL Take 10 mg by mouth 2 (two) times daily.   buPROPion 150 MG 12 hr tablet Commonly known as:  WELLBUTRIN SR Take 150 mg by mouth 2 (two) times daily.   CALCIUM CITRATE+D3 PETITES 200-250 MG-UNIT Tabs Generic drug:  Calcium Citrate-Vitamin D Take 1 tablet by mouth daily.   cholecalciferol 1000 units tablet Commonly known as:  VITAMIN D Take 2,000 Units by mouth daily.   fluticasone 50 MCG/ACT nasal spray Commonly known as:  FLONASE Place 1 spray 2 (two) times daily into both nostrils.   Fluticasone-Salmeterol 250-50 MCG/DOSE Aepb Commonly known as:  ADVAIR Inhale 1 puff 2 (two) times daily into the lungs.   gabapentin 400 MG capsule Commonly known as:  NEURONTIN Take 400 mg by mouth 3 (three) times daily.   ipratropium 0.02 % nebulizer solution Commonly known as:  ATROVENT Take 2.5 mLs (0.5 mg total) by nebulization every 6 (six) hours as needed for wheezing or shortness of breath. What changed:    when to take this  reasons to take this   memantine 10 MG tablet Commonly known as:  NAMENDA Take 10 mg by mouth 2 (two) times daily.   mupirocin ointment 2 % Commonly known as:  BACTROBAN Apply 1 application topically 2 (two) times daily.    neomycin-bacitracin-polymyxin ointment Commonly known as:  NEOSPORIN Apply topically 3 (three) times daily. Apply to scalp wound.   pantoprazole 40 MG tablet Commonly known as:  PROTONIX Take 1 tablet (40 mg total) by mouth 2 (two) times daily before a meal.   pramipexole 0.125 MG tablet Commonly known as:  MIRAPEX Take 0.125 mg by mouth at bedtime.   sennosides-docusate sodium 8.6-50 MG tablet Commonly known as:  SENOKOT-S Take 1 tablet by mouth daily as needed for constipation.   sertraline 50 MG tablet Commonly known as:  ZOLOFT Take 50 mg by mouth daily.   tamsulosin 0.4 MG Caps capsule Commonly known as:  FLOMAX Take 0.4 mg by mouth daily as needed.   traMADol 50 MG tablet Commonly known as:  ULTRAM Take 1 tablet (50 mg total) by mouth every 6 (six) hours as needed for severe pain. What changed:    how much to take  reasons to take this   traZODone 100 MG tablet Commonly known as:  DESYREL Take 200 mg by mouth at bedtime.   triamcinolone ointment 0.5 % Commonly known as:  KENALOG Apply 1 application topically 2 (two) times daily. Apply to affected areas of forearms twice daily.   vitamin B-12 1000 MCG tablet Commonly known as:  CYANOCOBALAMIN Take 1,000 mcg by mouth daily.   Vitamin D (Ergocalciferol) 50000 units Caps capsule Commonly known as:  DRISDOL Take 50,000 Units by mouth every 7 (seven) days.        Relevant Imaging Results:  Relevant Lab Results:   Additional Information    Smiley Birr, Clydene Pugh, LCSW

## 2017-07-07 NOTE — Discharge Instructions (Signed)
Follow with Primary MD  Anthony Fire, MD  and other consultant's as instructed your Hospitalist MD  Please get a complete blood count and chemistry panel checked by your Primary MD at your next visit, and again as instructed by your Primary MD.  Get Medicines reviewed and adjusted: Please take all your medications with you for your next visit with your Primary MD  Laboratory/radiological data: Please request your Primary MD to go over all hospital tests and procedure/radiological results at the follow up, please ask your Primary MD to get all Hospital records sent to his/her office.  In some cases, they will be blood work, cultures and biopsy results pending at the time of your discharge. Please request that your primary care M.D. follows up on these results.  Also Note the following: If you experience worsening of your admission symptoms, develop shortness of breath, life threatening emergency, suicidal or homicidal thoughts you must seek medical attention immediately by calling 911 or calling your MD immediately  if symptoms less severe.  You must read complete instructions/literature along with all the possible adverse reactions/side effects for all the Medicines you take and that have been prescribed to you. Take any new Medicines after you have completely understood and accpet all the possible adverse reactions/side effects.   Do not drive when taking Pain medications or sleeping medications (Benzodaizepines)  Do not take more than prescribed Pain, Sleep and Anxiety Medications. It is not advisable to combine anxiety,sleep and pain medications without talking with your primary care practitioner  Special Instructions: If you have smoked or chewed Tobacco  in the last 2 yrs please stop smoking, stop any regular Alcohol  and or any Recreational drug use.  Wear Seat belts while driving.  Please note: You were cared for by a hospitalist during your hospital stay. Once you are discharged,  your primary care physician will handle any further medical issues. Please note that NO REFILLS for any discharge medications will be authorized once you are discharged, as it is imperative that you return to your primary care physician (or establish a relationship with a primary care physician if you do not have one) for your post hospital discharge needs so that they can reassess your need for medications and monitor your lab values.      Gastrointestinal Bleeding Gastrointestinal bleeding is bleeding somewhere along the path food travels through the body (digestive tract). This path is anywhere between the mouth and the opening of the butt (anus). You may have blood in your poop (stools) or have black poop. If you throw up (vomit), there may be blood in it. This condition can be mild, serious, or even life-threatening. If you have a lot of bleeding, you may need to stay in the hospital. Follow these instructions at home:  Take over-the-counter and prescription medicines only as told by your doctor.  Eat foods that have a lot of fiber in them. These foods include whole grains, fruits, and vegetables. You can also try eating 1-3 prunes each day.  Drink enough fluid to keep your pee (urine) clear or pale yellow.  Keep all follow-up visits as told by your doctor. This is important. Contact a doctor if:  Your symptoms do not get better. Get help right away if:  Your bleeding gets worse.  You feel dizzy or you pass out (faint).  You feel weak.  You have very bad cramps in your back or belly (abdomen).  You pass large clumps of blood (clots) in your poop.  Your symptoms are getting worse. This information is not intended to replace advice given to you by your health care provider. Make sure you discuss any questions you have with your health care provider. Document Released: 12/09/2007 Document Revised: 08/07/2015 Document Reviewed: 08/19/2014 Elsevier Interactive Patient Education   2018 [employer].

## 2017-07-07 NOTE — Progress Notes (Signed)
Denies vomiting since yesterday morning and none seen.  Has been eating and denies nausea. IV removed in preparation for discharge.  Staff from [facility] to pick up soon

## 2017-07-07 NOTE — Clinical Social Work Note (Signed)
LCSW sent discharge clinicals and notified Tammy at [facility] of discharge. Facility to pick patient up by 1:30. RN aware.  LCSW spoke with Netta Cedars, [place], advised of discharge.    Megha Agnes, Clydene Pugh, LCSW

## 2017-07-08 DIAGNOSIS — Z9181 History of falling: Secondary | ICD-10-CM | POA: Diagnosis not present

## 2017-07-08 DIAGNOSIS — M6281 Muscle weakness (generalized): Secondary | ICD-10-CM | POA: Diagnosis not present

## 2017-07-08 DIAGNOSIS — Z48817 Encounter for surgical aftercare following surgery on the skin and subcutaneous tissue: Secondary | ICD-10-CM | POA: Diagnosis not present

## 2017-07-08 DIAGNOSIS — F0390 Unspecified dementia without behavioral disturbance: Secondary | ICD-10-CM | POA: Diagnosis not present

## 2017-07-08 DIAGNOSIS — L989 Disorder of the skin and subcutaneous tissue, unspecified: Secondary | ICD-10-CM | POA: Diagnosis not present

## 2017-07-08 DIAGNOSIS — F329 Major depressive disorder, single episode, unspecified: Secondary | ICD-10-CM | POA: Diagnosis not present

## 2017-07-11 DIAGNOSIS — Z48817 Encounter for surgical aftercare following surgery on the skin and subcutaneous tissue: Secondary | ICD-10-CM | POA: Diagnosis not present

## 2017-07-11 DIAGNOSIS — F0390 Unspecified dementia without behavioral disturbance: Secondary | ICD-10-CM | POA: Diagnosis not present

## 2017-07-11 DIAGNOSIS — M6281 Muscle weakness (generalized): Secondary | ICD-10-CM | POA: Diagnosis not present

## 2017-07-11 DIAGNOSIS — Z9181 History of falling: Secondary | ICD-10-CM | POA: Diagnosis not present

## 2017-07-11 DIAGNOSIS — F329 Major depressive disorder, single episode, unspecified: Secondary | ICD-10-CM | POA: Diagnosis not present

## 2017-07-11 DIAGNOSIS — L989 Disorder of the skin and subcutaneous tissue, unspecified: Secondary | ICD-10-CM | POA: Diagnosis not present

## 2017-07-12 ENCOUNTER — Encounter (HOSPITAL_COMMUNITY): Payer: Self-pay | Admitting: Internal Medicine

## 2017-07-12 DIAGNOSIS — F329 Major depressive disorder, single episode, unspecified: Secondary | ICD-10-CM | POA: Diagnosis not present

## 2017-07-12 DIAGNOSIS — L989 Disorder of the skin and subcutaneous tissue, unspecified: Secondary | ICD-10-CM | POA: Diagnosis not present

## 2017-07-12 DIAGNOSIS — K219 Gastro-esophageal reflux disease without esophagitis: Secondary | ICD-10-CM | POA: Diagnosis not present

## 2017-07-12 DIAGNOSIS — Z48817 Encounter for surgical aftercare following surgery on the skin and subcutaneous tissue: Secondary | ICD-10-CM | POA: Diagnosis not present

## 2017-07-12 DIAGNOSIS — J449 Chronic obstructive pulmonary disease, unspecified: Secondary | ICD-10-CM | POA: Diagnosis not present

## 2017-07-12 DIAGNOSIS — F0390 Unspecified dementia without behavioral disturbance: Secondary | ICD-10-CM | POA: Diagnosis not present

## 2017-07-12 DIAGNOSIS — Z9181 History of falling: Secondary | ICD-10-CM | POA: Diagnosis not present

## 2017-07-12 DIAGNOSIS — M1990 Unspecified osteoarthritis, unspecified site: Secondary | ICD-10-CM | POA: Diagnosis not present

## 2017-07-12 DIAGNOSIS — M6281 Muscle weakness (generalized): Secondary | ICD-10-CM | POA: Diagnosis not present

## 2017-07-12 DIAGNOSIS — N183 Chronic kidney disease, stage 3 (moderate): Secondary | ICD-10-CM | POA: Diagnosis not present

## 2017-07-14 DIAGNOSIS — L989 Disorder of the skin and subcutaneous tissue, unspecified: Secondary | ICD-10-CM | POA: Diagnosis not present

## 2017-07-14 DIAGNOSIS — Z9181 History of falling: Secondary | ICD-10-CM | POA: Diagnosis not present

## 2017-07-14 DIAGNOSIS — Z48817 Encounter for surgical aftercare following surgery on the skin and subcutaneous tissue: Secondary | ICD-10-CM | POA: Diagnosis not present

## 2017-07-14 DIAGNOSIS — C4442 Squamous cell carcinoma of skin of scalp and neck: Secondary | ICD-10-CM | POA: Diagnosis not present

## 2017-07-14 DIAGNOSIS — J449 Chronic obstructive pulmonary disease, unspecified: Secondary | ICD-10-CM | POA: Diagnosis not present

## 2017-07-14 DIAGNOSIS — F0390 Unspecified dementia without behavioral disturbance: Secondary | ICD-10-CM | POA: Diagnosis not present

## 2017-07-14 DIAGNOSIS — F329 Major depressive disorder, single episode, unspecified: Secondary | ICD-10-CM | POA: Diagnosis not present

## 2017-07-14 DIAGNOSIS — K920 Hematemesis: Secondary | ICD-10-CM | POA: Diagnosis not present

## 2017-07-14 DIAGNOSIS — D649 Anemia, unspecified: Secondary | ICD-10-CM | POA: Diagnosis not present

## 2017-07-14 DIAGNOSIS — N183 Chronic kidney disease, stage 3 (moderate): Secondary | ICD-10-CM | POA: Diagnosis not present

## 2017-07-14 DIAGNOSIS — M6281 Muscle weakness (generalized): Secondary | ICD-10-CM | POA: Diagnosis not present

## 2017-07-18 DIAGNOSIS — F0390 Unspecified dementia without behavioral disturbance: Secondary | ICD-10-CM | POA: Diagnosis not present

## 2017-07-18 DIAGNOSIS — Z48817 Encounter for surgical aftercare following surgery on the skin and subcutaneous tissue: Secondary | ICD-10-CM | POA: Diagnosis not present

## 2017-07-18 DIAGNOSIS — L989 Disorder of the skin and subcutaneous tissue, unspecified: Secondary | ICD-10-CM | POA: Diagnosis not present

## 2017-07-18 DIAGNOSIS — Z9181 History of falling: Secondary | ICD-10-CM | POA: Diagnosis not present

## 2017-07-18 DIAGNOSIS — M6281 Muscle weakness (generalized): Secondary | ICD-10-CM | POA: Diagnosis not present

## 2017-07-18 DIAGNOSIS — F329 Major depressive disorder, single episode, unspecified: Secondary | ICD-10-CM | POA: Diagnosis not present

## 2017-07-20 DIAGNOSIS — Z48817 Encounter for surgical aftercare following surgery on the skin and subcutaneous tissue: Secondary | ICD-10-CM | POA: Diagnosis not present

## 2017-07-20 DIAGNOSIS — F329 Major depressive disorder, single episode, unspecified: Secondary | ICD-10-CM | POA: Diagnosis not present

## 2017-07-20 DIAGNOSIS — L989 Disorder of the skin and subcutaneous tissue, unspecified: Secondary | ICD-10-CM | POA: Diagnosis not present

## 2017-07-20 DIAGNOSIS — Z9181 History of falling: Secondary | ICD-10-CM | POA: Diagnosis not present

## 2017-07-20 DIAGNOSIS — F0390 Unspecified dementia without behavioral disturbance: Secondary | ICD-10-CM | POA: Diagnosis not present

## 2017-07-20 DIAGNOSIS — M6281 Muscle weakness (generalized): Secondary | ICD-10-CM | POA: Diagnosis not present

## 2017-07-25 DIAGNOSIS — F329 Major depressive disorder, single episode, unspecified: Secondary | ICD-10-CM | POA: Diagnosis not present

## 2017-07-25 DIAGNOSIS — L989 Disorder of the skin and subcutaneous tissue, unspecified: Secondary | ICD-10-CM | POA: Diagnosis not present

## 2017-07-25 DIAGNOSIS — Z48817 Encounter for surgical aftercare following surgery on the skin and subcutaneous tissue: Secondary | ICD-10-CM | POA: Diagnosis not present

## 2017-07-25 DIAGNOSIS — F0390 Unspecified dementia without behavioral disturbance: Secondary | ICD-10-CM | POA: Diagnosis not present

## 2017-07-25 DIAGNOSIS — M6281 Muscle weakness (generalized): Secondary | ICD-10-CM | POA: Diagnosis not present

## 2017-07-25 DIAGNOSIS — Z9181 History of falling: Secondary | ICD-10-CM | POA: Diagnosis not present

## 2017-07-27 DIAGNOSIS — N183 Chronic kidney disease, stage 3 (moderate): Secondary | ICD-10-CM | POA: Diagnosis not present

## 2017-07-27 DIAGNOSIS — J449 Chronic obstructive pulmonary disease, unspecified: Secondary | ICD-10-CM | POA: Diagnosis not present

## 2017-07-27 DIAGNOSIS — M1990 Unspecified osteoarthritis, unspecified site: Secondary | ICD-10-CM | POA: Diagnosis not present

## 2017-07-28 DIAGNOSIS — F329 Major depressive disorder, single episode, unspecified: Secondary | ICD-10-CM | POA: Diagnosis not present

## 2017-07-28 DIAGNOSIS — Z9181 History of falling: Secondary | ICD-10-CM | POA: Diagnosis not present

## 2017-07-28 DIAGNOSIS — F0390 Unspecified dementia without behavioral disturbance: Secondary | ICD-10-CM | POA: Diagnosis not present

## 2017-07-28 DIAGNOSIS — L989 Disorder of the skin and subcutaneous tissue, unspecified: Secondary | ICD-10-CM | POA: Diagnosis not present

## 2017-07-28 DIAGNOSIS — Z48817 Encounter for surgical aftercare following surgery on the skin and subcutaneous tissue: Secondary | ICD-10-CM | POA: Diagnosis not present

## 2017-07-28 DIAGNOSIS — M6281 Muscle weakness (generalized): Secondary | ICD-10-CM | POA: Diagnosis not present

## 2017-08-01 DIAGNOSIS — F0390 Unspecified dementia without behavioral disturbance: Secondary | ICD-10-CM | POA: Diagnosis not present

## 2017-08-01 DIAGNOSIS — M6281 Muscle weakness (generalized): Secondary | ICD-10-CM | POA: Diagnosis not present

## 2017-08-01 DIAGNOSIS — Z48817 Encounter for surgical aftercare following surgery on the skin and subcutaneous tissue: Secondary | ICD-10-CM | POA: Diagnosis not present

## 2017-08-01 DIAGNOSIS — F329 Major depressive disorder, single episode, unspecified: Secondary | ICD-10-CM | POA: Diagnosis not present

## 2017-08-01 DIAGNOSIS — Z9181 History of falling: Secondary | ICD-10-CM | POA: Diagnosis not present

## 2017-08-01 DIAGNOSIS — L989 Disorder of the skin and subcutaneous tissue, unspecified: Secondary | ICD-10-CM | POA: Diagnosis not present

## 2017-08-02 DIAGNOSIS — M6281 Muscle weakness (generalized): Secondary | ICD-10-CM | POA: Diagnosis not present

## 2017-08-02 DIAGNOSIS — L989 Disorder of the skin and subcutaneous tissue, unspecified: Secondary | ICD-10-CM | POA: Diagnosis not present

## 2017-08-02 DIAGNOSIS — F329 Major depressive disorder, single episode, unspecified: Secondary | ICD-10-CM | POA: Diagnosis not present

## 2017-08-02 DIAGNOSIS — F0390 Unspecified dementia without behavioral disturbance: Secondary | ICD-10-CM | POA: Diagnosis not present

## 2017-08-02 DIAGNOSIS — Z48817 Encounter for surgical aftercare following surgery on the skin and subcutaneous tissue: Secondary | ICD-10-CM | POA: Diagnosis not present

## 2017-08-02 DIAGNOSIS — Z9181 History of falling: Secondary | ICD-10-CM | POA: Diagnosis not present

## 2017-08-03 DIAGNOSIS — Z9181 History of falling: Secondary | ICD-10-CM | POA: Diagnosis not present

## 2017-08-03 DIAGNOSIS — Z48817 Encounter for surgical aftercare following surgery on the skin and subcutaneous tissue: Secondary | ICD-10-CM | POA: Diagnosis not present

## 2017-08-03 DIAGNOSIS — F0390 Unspecified dementia without behavioral disturbance: Secondary | ICD-10-CM | POA: Diagnosis not present

## 2017-08-03 DIAGNOSIS — F329 Major depressive disorder, single episode, unspecified: Secondary | ICD-10-CM | POA: Diagnosis not present

## 2017-08-03 DIAGNOSIS — M6281 Muscle weakness (generalized): Secondary | ICD-10-CM | POA: Diagnosis not present

## 2017-08-03 DIAGNOSIS — L989 Disorder of the skin and subcutaneous tissue, unspecified: Secondary | ICD-10-CM | POA: Diagnosis not present

## 2017-08-04 DIAGNOSIS — H524 Presbyopia: Secondary | ICD-10-CM | POA: Diagnosis not present

## 2017-08-04 DIAGNOSIS — M6281 Muscle weakness (generalized): Secondary | ICD-10-CM | POA: Diagnosis not present

## 2017-08-04 DIAGNOSIS — H25811 Combined forms of age-related cataract, right eye: Secondary | ICD-10-CM | POA: Diagnosis not present

## 2017-08-04 DIAGNOSIS — N390 Urinary tract infection, site not specified: Secondary | ICD-10-CM | POA: Diagnosis not present

## 2017-08-04 DIAGNOSIS — Z48817 Encounter for surgical aftercare following surgery on the skin and subcutaneous tissue: Secondary | ICD-10-CM | POA: Diagnosis not present

## 2017-08-04 DIAGNOSIS — F329 Major depressive disorder, single episode, unspecified: Secondary | ICD-10-CM | POA: Diagnosis not present

## 2017-08-04 DIAGNOSIS — L989 Disorder of the skin and subcutaneous tissue, unspecified: Secondary | ICD-10-CM | POA: Diagnosis not present

## 2017-08-04 DIAGNOSIS — R4182 Altered mental status, unspecified: Secondary | ICD-10-CM | POA: Diagnosis not present

## 2017-08-04 DIAGNOSIS — F0390 Unspecified dementia without behavioral disturbance: Secondary | ICD-10-CM | POA: Diagnosis not present

## 2017-08-04 DIAGNOSIS — Z9181 History of falling: Secondary | ICD-10-CM | POA: Diagnosis not present

## 2017-08-05 ENCOUNTER — Emergency Department (HOSPITAL_COMMUNITY): Payer: Medicare Other

## 2017-08-05 ENCOUNTER — Encounter (HOSPITAL_COMMUNITY): Payer: Self-pay

## 2017-08-05 ENCOUNTER — Emergency Department (HOSPITAL_COMMUNITY)
Admission: EM | Admit: 2017-08-05 | Discharge: 2017-08-05 | Disposition: A | Discharge status: Home / Self Care | Payer: Medicare Other | Attending: Emergency Medicine | Admitting: Emergency Medicine

## 2017-08-05 DIAGNOSIS — Y939 Activity, unspecified: Secondary | ICD-10-CM | POA: Insufficient documentation

## 2017-08-05 DIAGNOSIS — N183 Chronic kidney disease, stage 3 (moderate): Secondary | ICD-10-CM | POA: Insufficient documentation

## 2017-08-05 DIAGNOSIS — F0390 Unspecified dementia without behavioral disturbance: Secondary | ICD-10-CM | POA: Insufficient documentation

## 2017-08-05 DIAGNOSIS — S299XXA Unspecified injury of thorax, initial encounter: Secondary | ICD-10-CM | POA: Diagnosis not present

## 2017-08-05 DIAGNOSIS — Z23 Encounter for immunization: Secondary | ICD-10-CM | POA: Insufficient documentation

## 2017-08-05 DIAGNOSIS — Z7982 Long term (current) use of aspirin: Secondary | ICD-10-CM | POA: Insufficient documentation

## 2017-08-05 DIAGNOSIS — R52 Pain, unspecified: Secondary | ICD-10-CM

## 2017-08-05 DIAGNOSIS — S0990XA Unspecified injury of head, initial encounter: Secondary | ICD-10-CM | POA: Diagnosis not present

## 2017-08-05 DIAGNOSIS — M25561 Pain in right knee: Secondary | ICD-10-CM | POA: Diagnosis not present

## 2017-08-05 DIAGNOSIS — Y999 Unspecified external cause status: Secondary | ICD-10-CM | POA: Insufficient documentation

## 2017-08-05 DIAGNOSIS — F17210 Nicotine dependence, cigarettes, uncomplicated: Secondary | ICD-10-CM | POA: Insufficient documentation

## 2017-08-05 DIAGNOSIS — S8992XA Unspecified injury of left lower leg, initial encounter: Secondary | ICD-10-CM | POA: Diagnosis not present

## 2017-08-05 DIAGNOSIS — S79911A Unspecified injury of right hip, initial encounter: Secondary | ICD-10-CM | POA: Diagnosis not present

## 2017-08-05 DIAGNOSIS — G4489 Other headache syndrome: Secondary | ICD-10-CM | POA: Diagnosis not present

## 2017-08-05 DIAGNOSIS — S0001XA Abrasion of scalp, initial encounter: Secondary | ICD-10-CM | POA: Insufficient documentation

## 2017-08-05 DIAGNOSIS — Z79899 Other long term (current) drug therapy: Secondary | ICD-10-CM | POA: Insufficient documentation

## 2017-08-05 DIAGNOSIS — Y92129 Unspecified place in nursing home as the place of occurrence of the external cause: Secondary | ICD-10-CM | POA: Insufficient documentation

## 2017-08-05 DIAGNOSIS — J45909 Unspecified asthma, uncomplicated: Secondary | ICD-10-CM | POA: Diagnosis not present

## 2017-08-05 DIAGNOSIS — S098XXA Other specified injuries of head, initial encounter: Secondary | ICD-10-CM | POA: Diagnosis not present

## 2017-08-05 DIAGNOSIS — W19XXXA Unspecified fall, initial encounter: Secondary | ICD-10-CM | POA: Insufficient documentation

## 2017-08-05 DIAGNOSIS — M79605 Pain in left leg: Secondary | ICD-10-CM | POA: Diagnosis not present

## 2017-08-05 DIAGNOSIS — M25551 Pain in right hip: Secondary | ICD-10-CM | POA: Diagnosis not present

## 2017-08-05 DIAGNOSIS — M25552 Pain in left hip: Secondary | ICD-10-CM | POA: Insufficient documentation

## 2017-08-05 DIAGNOSIS — M79604 Pain in right leg: Secondary | ICD-10-CM | POA: Diagnosis not present

## 2017-08-05 DIAGNOSIS — S79912A Unspecified injury of left hip, initial encounter: Secondary | ICD-10-CM | POA: Diagnosis not present

## 2017-08-05 MED ORDER — ACETAMINOPHEN 325 MG PO TABS
650.0000 mg | ORAL_TABLET | Freq: Once | ORAL | Status: AC
  Administered 2017-08-05: 650 mg via ORAL
  Filled 2017-08-05: qty 2

## 2017-08-05 MED ORDER — TETANUS-DIPHTH-ACELL PERTUSSIS 5-2.5-18.5 LF-MCG/0.5 IM SUSP
0.5000 mL | Freq: Once | INTRAMUSCULAR | Status: AC
Start: 2017-08-05 — End: 2017-08-05
  Administered 2017-08-05: 0.5 mL via INTRAMUSCULAR
  Filled 2017-08-05: qty 0.5

## 2017-08-05 NOTE — ED Provider Notes (Signed)
[facility] EMERGENCY DEPARTMENT Provider Note   CSN: [id number] Arrival date & time: 08/05/17  0803     History   Chief Complaint Chief Complaint  Patient presents with  . Leg Pain    HPI Anthony [name] is a 77 y.o. male.  The history is provided by the EMS personnel, the nursing home and the patient. The history is limited by the condition of the patient (Hx dementia).  Leg Pain    Pt was seen at 0820. Per EMS and NH report: Pt brought to ED s/p unwitnessed fall. Pt states he slipped and fell. Pt c/o bilat hips and knees pain. Unclear if he hit his head. Pt has significant hs of dementia.   Past Medical History:  Diagnosis Date  . Acute pyelonephritis 12/23/2014  . Asthma   . BPH (benign prostatic hyperplasia)   . Chronic back pain   . Chronic hip pain   . COPD (chronic obstructive pulmonary disease) ([place])   . Dementia   . Depression   . GERD (gastroesophageal reflux disease)   . Kidney stone   . Neuropathy   . Pulmonary nodules 12/25/2014  . Restless leg syndrome     Patient Active Problem List   Diagnosis Date Noted  . Hematemesis 07/06/2017  . Normochromic normocytic anemia 06/19/2017  . CKD (chronic kidney disease) stage 3, GFR 30-59 ml/min (HCC) 06/19/2017  . Dementia 06/19/2017  . Decubitus ulcer 06/19/2017  . Open wnd of scalp   . S/p left hip fracture   . HCAP (healthcare-associated pneumonia) 10/31/2016  . CKD (chronic kidney disease) 10/31/2016  . COPD (chronic obstructive pulmonary disease) ([place]) 10/31/2016  . Hyponatremia 12/25/2014  . Hypokalemia 12/25/2014  . Normocytic anemia 12/25/2014  . Pulmonary nodules 12/25/2014  . Tobacco abuse 12/25/2014  . ARF (acute renal failure) ([place]) 12/24/2014  . Acute pyelonephritis 12/23/2014    Past Surgical History:  Procedure Laterality Date  . ABDOMINAL SURGERY     heria  . ESOPHAGOGASTRODUODENOSCOPY N/A 07/06/2017   Procedure: ESOPHAGOGASTRODUODENOSCOPY (EGD);  Surgeon: Rogene Houston, MD;   [facility];  Service: Endoscopy;  Laterality: N/A;  . EYE SURGERY          Home Medications    Prior to Admission medications   Medication Sig Start Date End Date Taking? Authorizing Provider  acetaminophen (TYLENOL) 500 MG tablet Take 500 mg every 8 (eight) hours as needed by mouth for mild pain.    [provider]  albuterol (PROVENTIL HFA;VENTOLIN HFA) 108 (90 BASE) MCG/ACT inhaler Inhale 2 puffs into the lungs every 4 (four) hours as needed for wheezing or shortness of breath.    [provider]  albuterol (PROVENTIL) (2.5 MG/3ML) 0.083% nebulizer solution Take 3 mLs (2.5 mg total) by nebulization every 4 (four) hours as needed for wheezing or shortness of breath. 07/07/17   Irwin Brakeman L, MD  aspirin EC 81 MG tablet Take 81 mg by mouth daily.    [provider]  baclofen (LIORESAL) 10 MG tablet Take 10 mg by mouth 2 (two) times daily.    [provider]  buPROPion (WELLBUTRIN SR) 150 MG 12 hr tablet Take 150 mg by mouth 2 (two) times daily.    [provider]  Calcium Citrate-Vitamin D (CALCIUM CITRATE+D3 PETITES) 200-250 MG-UNIT TABS Take 1 tablet by mouth daily.    [provider]  cholecalciferol (VITAMIN D) 1000 units tablet Take 2,000 Units by mouth daily.    [provider]  fluticasone Asencion Islam)  50 MCG/ACT nasal spray Place 1 spray 2 (two) times daily into both nostrils.     [provider]  Fluticasone-Salmeterol (ADVAIR) 250-50 MCG/DOSE AEPB Inhale 1 puff 2 (two) times daily into the lungs.    [provider]  gabapentin (NEURONTIN) 400 MG capsule Take 400 mg by mouth 3 (three) times daily.    [provider]  ipratropium (ATROVENT) 0.02 % nebulizer solution Take 2.5 mLs (0.5 mg total) by nebulization every 6 (six) hours as needed for wheezing or shortness of breath. 07/07/17   Johnson, Clanford L, MD  memantine (NAMENDA) 10 MG tablet Take 10 mg by mouth 2 (two) times daily.     [provider]  mupirocin ointment (BACTROBAN) 2 % Apply 1 application topically 2 (two) times daily.    [provider]  neomycin-bacitracin-polymyxin (NEOSPORIN) ointment Apply topically 3 (three) times daily. Apply to scalp wound. 06/21/17   Arrien, Jimmy Picket, MD  pantoprazole (PROTONIX) 40 MG tablet Take 1 tablet (40 mg total) by mouth 2 (two) times daily before a meal. 07/07/17   Johnson, Clanford L, MD  pramipexole (MIRAPEX) 0.125 MG tablet Take 0.125 mg by mouth at bedtime.    [provider]  sennosides-docusate sodium (SENOKOT-S) 8.6-50 MG tablet Take 1 tablet by mouth daily as needed for constipation.    [provider]  sertraline (ZOLOFT) 50 MG tablet Take 50 mg by mouth daily.    [provider]  tamsulosin (FLOMAX) 0.4 MG CAPS capsule Take 0.4 mg by mouth daily as needed.     [provider]  traMADol (ULTRAM) 50 MG tablet Take 1 tablet (50 mg total) by mouth every 6 (six) hours as needed for severe pain. 07/07/17   Johnson, Clanford L, MD  traZODone (DESYREL) 100 MG tablet Take 200 mg by mouth at bedtime.    [provider]  triamcinolone ointment (KENALOG) 0.5 % Apply 1 application topically 2 (two) times daily. Apply to affected areas of forearms twice daily.    [provider]  vitamin B-12 (CYANOCOBALAMIN) 1000 MCG tablet Take 1,000 mcg by mouth daily.    [provider]  Vitamin D, Ergocalciferol, (DRISDOL) 50000 units CAPS capsule Take 50,000 Units by mouth every 7 (seven) days.    [provider]    Family History Family History  Family history unknown: Yes    Social History Social History   Tobacco Use  . Smoking status: Current Every Day Smoker    Packs/day: 0.50    Types: Cigarettes  . Smokeless tobacco: Never Used  Substance Use Topics  . Alcohol use: No  . Drug use: No     Allergies   Bee venom; Penicillins; and Strawberry extract   Review of Systems Review  of Systems  Unable to perform ROS: Dementia     Physical Exam Updated Vital Signs BP 121/79 (BP Location: Right Arm)   Pulse 93   Temp 98.6 F (37 C) (Oral)   Resp 20   Wt 83.9 kg (185 lb)   SpO2 91%   BMI 24.41 kg/m   Physical Exam 0825: Physical examination: Vital signs and O2 SAT: Reviewed; Constitutional: Well developed, Well nourished, Well hydrated, In no acute distress; Head and Face: Normocephalic, +superficial abrasion to posterior vertex scalp.; Eyes: EOMI, PERRL, No scleral icterus; ENMT: Mouth and pharynx normal, Mucous membranes moist; Neck: Supple, Trachea midline; Spine: No midline CS, TS, LS tenderness.; Cardiovascular: Regular rate and rhythm, No gallop; Respiratory: Breath sounds clear & equal bilaterally,  No wheezes, Normal respiratory effort/excursion; Chest: Nontender, No deformity, Movement normal, No crepitus, No abrasions or ecchymosis.; Abdomen: Soft, Nontender, Nondistended, Normal bowel sounds, No abrasions or ecchymosis.; Genitourinary: No CVA tenderness;; Extremities: Chronic bilat knees arthritic changes. +TTP bilat hips and knees. NT bilat ankles/feet. No deformity. Otherwise full range of motion major/large joints of bilat UE's and LE's without pain or tenderness to palp.  Pt will spontaneously move bilat hips/knees/ankles/feet.  No ligamentous laxity.  No patellar or quad tendon step-offs.  NMS intact bilat feet, strong pedal pp. +plantarflexion of right and left foot w/calf squeeze.  No palpable gap right and left Achilles's tendon.  No proximal fibular head tenderness bilat.  No edema, erythema, warmth, ecchymosis or deformity.  No specific area of point tenderness.  Scattered ecchymosis in various stages bilat UE's.  Neurovascularly intact, Pulses normal, No tenderness, No edema, Pelvis stable; Neuro: Awake, alert, confused per hx dementia. No facial droop. Speech clear. No gross focal motor or sensory deficits in extremities.; Skin: Color normal, Warm,  Dry    ED Treatments / Results  Labs (all labs ordered are listed, but only abnormal results are displayed)   EKG None  Radiology   Procedures Procedures (including critical care time)  Medications Ordered in ED Medications  Tdap (BOOSTRIX) injection 0.5 mL (has no administration in time range)     Initial Impression / Assessment and Plan / ED Course  I have reviewed the triage vital signs and the nursing notes.  Pertinent labs & imaging results that were available during my care of the patient were reviewed by me and considered in my medical decision making (see chart for details).  MDM Reviewed: previous chart, nursing note and vitals Interpretation: x-ray and CT scan    Dg Chest 1 View Result Date: 08/05/2017 CLINICAL DATA:  Unwitnessed fall EXAM: CHEST  1 VIEW COMPARISON:  06/19/2017 FINDINGS: Cardiac shadow is within normal limits. Aortic calcifications are again seen. Mild chronic interstitial changes are noted without focal infiltrate or sizable effusion. No acute bony abnormality is seen. IMPRESSION: Chronic changes without acute abnormality. Electronically Signed   By: Inez Catalina M.D.   On: 08/05/2017 09:30   Ct Head Wo Contrast Result Date: 08/05/2017 CLINICAL DATA:  Recent head trauma EXAM: CT HEAD WITHOUT CONTRAST TECHNIQUE: Contiguous axial images were obtained from the base of the skull through the vertex without intravenous contrast. COMPARISON:  None. FINDINGS: Brain: Chronic atrophic changes are noted. No findings to suggest acute hemorrhage, acute infarction or space-occupying mass lesion are noted. Vascular: No hyperdense vessel or unexpected calcification. Skull: Normal. Negative for fracture or focal lesion. Sinuses/Orbits: No acute finding. Other: None. IMPRESSION: No acute intracranial abnormality noted. Chronic atrophic changes are seen. Electronically Signed   By: Inez Catalina M.D.   On: 08/05/2017 09:35   Dg Knee Complete 4 Views Left Result Date:  08/05/2017 CLINICAL DATA:  Unwitnessed fall. EXAM: LEFT KNEE - COMPLETE 4+ VIEW COMPARISON:  None. FINDINGS: Chondrocalcinosis. Moderate degenerative changes throughout the left knee. No acute bony abnormality. Specifically, no fracture, subluxation, or dislocation. No joint effusion. IMPRESSION: Chondrocalcinosis and degenerative changes. No acute bony abnormality. Electronically Signed   By: Rolm Baptise M.D.   On: 08/05/2017 09:30   Dg Knee Complete 4 Views Right Result Date: 08/05/2017 CLINICAL DATA:  Right knee pain after fall. EXAM: RIGHT KNEE - COMPLETE 4+ VIEW COMPARISON:  Right femur x-rays dated June 10, 2017. FINDINGS: No acute fracture or dislocation. No joint effusion. Mild tricompartmental joint space narrowing.  Chondrocalcinosis of the menisci. Osteopenia. Vascular calcifications. IMPRESSION: 1.  No acute osseous abnormality. 2. Mild tricompartmental osteoarthritis. Electronically Signed   By: Titus Dubin M.D.   On: 08/05/2017 09:33   Dg Hips Bilat With Pelvis Min 5 Views Result Date: 08/05/2017 CLINICAL DATA:  Recent fall with leg pain, initial encounter EXAM: DG HIP (WITH OR WITHOUT PELVIS) 5+V BILAT COMPARISON:  06/10/2017 FINDINGS: Postsurgical changes are seen. Pelvic ring is intact. Mild degenerative changes of the hip joints are noted bilaterally. No soft tissue changes are seen. Prior hernia repair is noted. IMPRESSION: Chronic changes without acute abnormality. Electronically Signed   By: Inez Catalina M.D.   On: 08/05/2017 09:31    1030:   Pt is talking in full sentences and does not appear SOB. Pulse ox not on pt's finger correctly; when correctly positioned, pt's O2 Sat 95%+ R/A.  XR/CT reassuring. Pt is moving all his extremities on stretcher spontaneously. Will d/c back to NH.     Final Clinical Impressions(s) / ED Diagnoses   Final diagnoses:  None    ED Discharge Orders    None       Francine Graven, DO 08/08/17 [id number]

## 2017-08-05 NOTE — ED Triage Notes (Signed)
Pt brought in by EMS from [facility] due to unwitnessed fall. Pt reports right leg pain. No loc.Pt says he slipped and fell

## 2017-08-05 NOTE — Discharge Instructions (Signed)
Take your usual prescriptions as previously directed.  Apply moist heat or ice to the area(s) of discomfort, for 15 minutes at a time, several times per day for the next few days.  Do not fall asleep on a heating or ice pack.  Call your regular medical doctor today to schedule a follow up appointment in the next 3 days.  Return to the Emergency Department immediately if worsening.

## 2017-08-07 DIAGNOSIS — F329 Major depressive disorder, single episode, unspecified: Secondary | ICD-10-CM | POA: Diagnosis not present

## 2017-08-07 DIAGNOSIS — F0390 Unspecified dementia without behavioral disturbance: Secondary | ICD-10-CM | POA: Diagnosis not present

## 2017-08-07 DIAGNOSIS — J449 Chronic obstructive pulmonary disease, unspecified: Secondary | ICD-10-CM | POA: Diagnosis not present

## 2017-08-07 DIAGNOSIS — Z9181 History of falling: Secondary | ICD-10-CM | POA: Diagnosis not present

## 2017-08-07 DIAGNOSIS — N183 Chronic kidney disease, stage 3 (moderate): Secondary | ICD-10-CM | POA: Diagnosis not present

## 2017-08-07 DIAGNOSIS — T8189XD Other complications of procedures, not elsewhere classified, subsequent encounter: Secondary | ICD-10-CM | POA: Diagnosis not present

## 2017-08-09 DIAGNOSIS — T8189XD Other complications of procedures, not elsewhere classified, subsequent encounter: Secondary | ICD-10-CM | POA: Diagnosis not present

## 2017-08-09 DIAGNOSIS — F0390 Unspecified dementia without behavioral disturbance: Secondary | ICD-10-CM | POA: Diagnosis not present

## 2017-08-09 DIAGNOSIS — N183 Chronic kidney disease, stage 3 (moderate): Secondary | ICD-10-CM | POA: Diagnosis not present

## 2017-08-09 DIAGNOSIS — F329 Major depressive disorder, single episode, unspecified: Secondary | ICD-10-CM | POA: Diagnosis not present

## 2017-08-09 DIAGNOSIS — J449 Chronic obstructive pulmonary disease, unspecified: Secondary | ICD-10-CM | POA: Diagnosis not present

## 2017-08-09 DIAGNOSIS — Z9181 History of falling: Secondary | ICD-10-CM | POA: Diagnosis not present

## 2017-08-11 DIAGNOSIS — R4182 Altered mental status, unspecified: Secondary | ICD-10-CM | POA: Diagnosis not present

## 2017-08-11 DIAGNOSIS — N183 Chronic kidney disease, stage 3 (moderate): Secondary | ICD-10-CM | POA: Diagnosis not present

## 2017-08-11 DIAGNOSIS — Z9181 History of falling: Secondary | ICD-10-CM | POA: Diagnosis not present

## 2017-08-11 DIAGNOSIS — F0390 Unspecified dementia without behavioral disturbance: Secondary | ICD-10-CM | POA: Diagnosis not present

## 2017-08-11 DIAGNOSIS — J449 Chronic obstructive pulmonary disease, unspecified: Secondary | ICD-10-CM | POA: Diagnosis not present

## 2017-08-11 DIAGNOSIS — F329 Major depressive disorder, single episode, unspecified: Secondary | ICD-10-CM | POA: Diagnosis not present

## 2017-08-11 DIAGNOSIS — G309 Alzheimer's disease, unspecified: Secondary | ICD-10-CM | POA: Diagnosis not present

## 2017-08-11 DIAGNOSIS — T8189XD Other complications of procedures, not elsewhere classified, subsequent encounter: Secondary | ICD-10-CM | POA: Diagnosis not present

## 2017-08-15 DIAGNOSIS — F0390 Unspecified dementia without behavioral disturbance: Secondary | ICD-10-CM | POA: Diagnosis not present

## 2017-08-15 DIAGNOSIS — T8189XD Other complications of procedures, not elsewhere classified, subsequent encounter: Secondary | ICD-10-CM | POA: Diagnosis not present

## 2017-08-15 DIAGNOSIS — Z9181 History of falling: Secondary | ICD-10-CM | POA: Diagnosis not present

## 2017-08-15 DIAGNOSIS — F329 Major depressive disorder, single episode, unspecified: Secondary | ICD-10-CM | POA: Diagnosis not present

## 2017-08-15 DIAGNOSIS — J449 Chronic obstructive pulmonary disease, unspecified: Secondary | ICD-10-CM | POA: Diagnosis not present

## 2017-08-15 DIAGNOSIS — N183 Chronic kidney disease, stage 3 (moderate): Secondary | ICD-10-CM | POA: Diagnosis not present

## 2017-08-18 DIAGNOSIS — J449 Chronic obstructive pulmonary disease, unspecified: Secondary | ICD-10-CM | POA: Diagnosis not present

## 2017-08-18 DIAGNOSIS — F0390 Unspecified dementia without behavioral disturbance: Secondary | ICD-10-CM | POA: Diagnosis not present

## 2017-08-18 DIAGNOSIS — Z9181 History of falling: Secondary | ICD-10-CM | POA: Diagnosis not present

## 2017-08-18 DIAGNOSIS — T8189XD Other complications of procedures, not elsewhere classified, subsequent encounter: Secondary | ICD-10-CM | POA: Diagnosis not present

## 2017-08-18 DIAGNOSIS — F329 Major depressive disorder, single episode, unspecified: Secondary | ICD-10-CM | POA: Diagnosis not present

## 2017-08-18 DIAGNOSIS — N183 Chronic kidney disease, stage 3 (moderate): Secondary | ICD-10-CM | POA: Diagnosis not present

## 2017-08-22 DIAGNOSIS — T8189XD Other complications of procedures, not elsewhere classified, subsequent encounter: Secondary | ICD-10-CM | POA: Diagnosis not present

## 2017-08-22 DIAGNOSIS — F329 Major depressive disorder, single episode, unspecified: Secondary | ICD-10-CM | POA: Diagnosis not present

## 2017-08-22 DIAGNOSIS — J449 Chronic obstructive pulmonary disease, unspecified: Secondary | ICD-10-CM | POA: Diagnosis not present

## 2017-08-22 DIAGNOSIS — Z9181 History of falling: Secondary | ICD-10-CM | POA: Diagnosis not present

## 2017-08-22 DIAGNOSIS — N183 Chronic kidney disease, stage 3 (moderate): Secondary | ICD-10-CM | POA: Diagnosis not present

## 2017-08-22 DIAGNOSIS — F0390 Unspecified dementia without behavioral disturbance: Secondary | ICD-10-CM | POA: Diagnosis not present

## 2017-08-24 DIAGNOSIS — J449 Chronic obstructive pulmonary disease, unspecified: Secondary | ICD-10-CM | POA: Diagnosis not present

## 2017-08-24 DIAGNOSIS — N183 Chronic kidney disease, stage 3 (moderate): Secondary | ICD-10-CM | POA: Diagnosis not present

## 2017-08-24 DIAGNOSIS — F329 Major depressive disorder, single episode, unspecified: Secondary | ICD-10-CM | POA: Diagnosis not present

## 2017-08-24 DIAGNOSIS — T8189XD Other complications of procedures, not elsewhere classified, subsequent encounter: Secondary | ICD-10-CM | POA: Diagnosis not present

## 2017-08-24 DIAGNOSIS — F0390 Unspecified dementia without behavioral disturbance: Secondary | ICD-10-CM | POA: Diagnosis not present

## 2017-08-24 DIAGNOSIS — Z9181 History of falling: Secondary | ICD-10-CM | POA: Diagnosis not present

## 2017-08-25 DIAGNOSIS — J449 Chronic obstructive pulmonary disease, unspecified: Secondary | ICD-10-CM | POA: Diagnosis not present

## 2017-08-30 DIAGNOSIS — H524 Presbyopia: Secondary | ICD-10-CM | POA: Diagnosis not present

## 2017-08-30 DIAGNOSIS — F0390 Unspecified dementia without behavioral disturbance: Secondary | ICD-10-CM | POA: Diagnosis not present

## 2017-08-30 DIAGNOSIS — J449 Chronic obstructive pulmonary disease, unspecified: Secondary | ICD-10-CM | POA: Diagnosis not present

## 2017-08-30 DIAGNOSIS — Z9181 History of falling: Secondary | ICD-10-CM | POA: Diagnosis not present

## 2017-08-30 DIAGNOSIS — F329 Major depressive disorder, single episode, unspecified: Secondary | ICD-10-CM | POA: Diagnosis not present

## 2017-08-30 DIAGNOSIS — N183 Chronic kidney disease, stage 3 (moderate): Secondary | ICD-10-CM | POA: Diagnosis not present

## 2017-08-30 DIAGNOSIS — T8189XD Other complications of procedures, not elsewhere classified, subsequent encounter: Secondary | ICD-10-CM | POA: Diagnosis not present

## 2017-09-01 DIAGNOSIS — Z9181 History of falling: Secondary | ICD-10-CM | POA: Diagnosis not present

## 2017-09-01 DIAGNOSIS — T8189XD Other complications of procedures, not elsewhere classified, subsequent encounter: Secondary | ICD-10-CM | POA: Diagnosis not present

## 2017-09-01 DIAGNOSIS — F329 Major depressive disorder, single episode, unspecified: Secondary | ICD-10-CM | POA: Diagnosis not present

## 2017-09-01 DIAGNOSIS — J449 Chronic obstructive pulmonary disease, unspecified: Secondary | ICD-10-CM | POA: Diagnosis not present

## 2017-09-01 DIAGNOSIS — F0390 Unspecified dementia without behavioral disturbance: Secondary | ICD-10-CM | POA: Diagnosis not present

## 2017-09-01 DIAGNOSIS — N183 Chronic kidney disease, stage 3 (moderate): Secondary | ICD-10-CM | POA: Diagnosis not present

## 2017-09-06 DIAGNOSIS — M79674 Pain in right toe(s): Secondary | ICD-10-CM | POA: Diagnosis not present

## 2017-09-06 DIAGNOSIS — F0390 Unspecified dementia without behavioral disturbance: Secondary | ICD-10-CM | POA: Diagnosis not present

## 2017-09-06 DIAGNOSIS — B351 Tinea unguium: Secondary | ICD-10-CM | POA: Diagnosis not present

## 2017-09-06 DIAGNOSIS — J449 Chronic obstructive pulmonary disease, unspecified: Secondary | ICD-10-CM | POA: Diagnosis not present

## 2017-09-06 DIAGNOSIS — N183 Chronic kidney disease, stage 3 (moderate): Secondary | ICD-10-CM | POA: Diagnosis not present

## 2017-09-06 DIAGNOSIS — F329 Major depressive disorder, single episode, unspecified: Secondary | ICD-10-CM | POA: Diagnosis not present

## 2017-09-06 DIAGNOSIS — Z9181 History of falling: Secondary | ICD-10-CM | POA: Diagnosis not present

## 2017-09-06 DIAGNOSIS — T8189XD Other complications of procedures, not elsewhere classified, subsequent encounter: Secondary | ICD-10-CM | POA: Diagnosis not present

## 2017-09-06 DIAGNOSIS — M79675 Pain in left toe(s): Secondary | ICD-10-CM | POA: Diagnosis not present

## 2017-09-09 ENCOUNTER — Emergency Department (HOSPITAL_COMMUNITY): Payer: Medicare Other

## 2017-09-09 ENCOUNTER — Other Ambulatory Visit: Payer: Self-pay

## 2017-09-09 ENCOUNTER — Encounter (HOSPITAL_COMMUNITY): Payer: Self-pay | Admitting: Emergency Medicine

## 2017-09-09 ENCOUNTER — Emergency Department (HOSPITAL_COMMUNITY)
Admission: EM | Admit: 2017-09-09 | Discharge: 2017-09-09 | Disposition: A | Discharge status: Skilled Nursing Facility | Payer: Medicare Other | Attending: Emergency Medicine | Admitting: Emergency Medicine

## 2017-09-09 DIAGNOSIS — S46911A Strain of unspecified muscle, fascia and tendon at shoulder and upper arm level, right arm, initial encounter: Secondary | ICD-10-CM | POA: Insufficient documentation

## 2017-09-09 DIAGNOSIS — J45909 Unspecified asthma, uncomplicated: Secondary | ICD-10-CM | POA: Insufficient documentation

## 2017-09-09 DIAGNOSIS — M25511 Pain in right shoulder: Secondary | ICD-10-CM | POA: Diagnosis not present

## 2017-09-09 DIAGNOSIS — Z79899 Other long term (current) drug therapy: Secondary | ICD-10-CM | POA: Insufficient documentation

## 2017-09-09 DIAGNOSIS — Z87891 Personal history of nicotine dependence: Secondary | ICD-10-CM | POA: Insufficient documentation

## 2017-09-09 DIAGNOSIS — W19XXXA Unspecified fall, initial encounter: Secondary | ICD-10-CM | POA: Diagnosis not present

## 2017-09-09 DIAGNOSIS — Y999 Unspecified external cause status: Secondary | ICD-10-CM | POA: Diagnosis not present

## 2017-09-09 DIAGNOSIS — N183 Chronic kidney disease, stage 3 (moderate): Secondary | ICD-10-CM | POA: Diagnosis not present

## 2017-09-09 DIAGNOSIS — Z7982 Long term (current) use of aspirin: Secondary | ICD-10-CM | POA: Insufficient documentation

## 2017-09-09 DIAGNOSIS — Y939 Activity, unspecified: Secondary | ICD-10-CM | POA: Insufficient documentation

## 2017-09-09 DIAGNOSIS — Y92129 Unspecified place in nursing home as the place of occurrence of the external cause: Secondary | ICD-10-CM | POA: Diagnosis not present

## 2017-09-09 DIAGNOSIS — F0390 Unspecified dementia without behavioral disturbance: Secondary | ICD-10-CM | POA: Insufficient documentation

## 2017-09-09 DIAGNOSIS — S4991XA Unspecified injury of right shoulder and upper arm, initial encounter: Secondary | ICD-10-CM | POA: Diagnosis not present

## 2017-09-09 MED ORDER — TRAMADOL HCL 50 MG PO TABS
50.0000 mg | ORAL_TABLET | Freq: Once | ORAL | Status: AC
  Administered 2017-09-09: 50 mg via ORAL

## 2017-09-09 NOTE — ED Provider Notes (Signed)
[facility] EMERGENCY DEPARTMENT Provider Note   CSN: [id number] Arrival date & time: 09/09/17  1241     History   Chief Complaint Chief Complaint  Patient presents with  . Shoulder Pain    right    HPI Anthony [name] is a 77 y.o. male.  Patient presents with nursing home staff after unwitnessed fall.  Patient's had right shoulder pain for the past week and says he recalls falling on the right shoulder.  No other injuries.  No head injury or loss of consciousness.  No fevers or other symptoms.  Pain with palpation upper right arm.     Past Medical History:  Diagnosis Date  . Acute pyelonephritis 12/23/2014  . Asthma   . BPH (benign prostatic hyperplasia)   . Chronic back pain   . Chronic hip pain   . COPD (chronic obstructive pulmonary disease) ([place])   . Dementia   . Depression   . GERD (gastroesophageal reflux disease)   . Kidney stone   . Neuropathy   . Pulmonary nodules 12/25/2014  . Restless leg syndrome     Patient Active Problem List   Diagnosis Date Noted  . Hematemesis 07/06/2017  . Normochromic normocytic anemia 06/19/2017  . CKD (chronic kidney disease) stage 3, GFR 30-59 ml/min (HCC) 06/19/2017  . Dementia 06/19/2017  . Decubitus ulcer 06/19/2017  . Open wnd of scalp   . S/p left hip fracture   . HCAP (healthcare-associated pneumonia) 10/31/2016  . CKD (chronic kidney disease) 10/31/2016  . COPD (chronic obstructive pulmonary disease) ([place]) 10/31/2016  . Hyponatremia 12/25/2014  . Hypokalemia 12/25/2014  . Normocytic anemia 12/25/2014  . Pulmonary nodules 12/25/2014  . Tobacco abuse 12/25/2014  . ARF (acute renal failure) ([place]) 12/24/2014  . Acute pyelonephritis 12/23/2014    Past Surgical History:  Procedure Laterality Date  . ABDOMINAL SURGERY     heria  . ESOPHAGOGASTRODUODENOSCOPY N/A 07/06/2017   Procedure: ESOPHAGOGASTRODUODENOSCOPY (EGD);  Surgeon: Rogene Houston, MD;  Location: [facility];  Service: Endoscopy;  Laterality: N/A;    . EYE SURGERY          Home Medications    Prior to Admission medications   Medication Sig Start Date End Date Taking? Authorizing Provider  acetaminophen (TYLENOL) 500 MG tablet Take 500 mg every 8 (eight) hours as needed by mouth for mild pain.   Yes [provider]  albuterol (PROVENTIL HFA;VENTOLIN HFA) 108 (90 BASE) MCG/ACT inhaler Inhale 2 puffs into the lungs every 4 (four) hours as needed for wheezing or shortness of breath.   Yes [provider]  albuterol (PROVENTIL) (2.5 MG/3ML) 0.083% nebulizer solution Take 3 mLs (2.5 mg total) by nebulization every 4 (four) hours as needed for wheezing or shortness of breath. 07/07/17  Yes Johnson, Clanford L, MD  aspirin EC 81 MG tablet Take 81 mg by mouth daily.   Yes [provider]  baclofen (LIORESAL) 10 MG tablet Take 10 mg by mouth 2 (two) times daily.   Yes [provider]  buPROPion (WELLBUTRIN SR) 150 MG 12 hr tablet Take 150 mg by mouth 2 (two) times daily.   Yes [provider]  Calcium Citrate-Vitamin D (CALCIUM CITRATE+D3 PETITES) 200-250 MG-UNIT TABS Take 1 tablet by mouth daily.   Yes [provider]  cholecalciferol (VITAMIN D) 1000 units tablet Take 2,000 Units by mouth daily.   Yes [provider]  fluticasone (FLONASE) 50 MCG/ACT nasal spray Place 1 spray 2 (two) times daily into both nostrils.  Yes [provider]  Fluticasone-Salmeterol (ADVAIR) 250-50 MCG/DOSE AEPB Inhale 1 puff 2 (two) times daily into the lungs.   Yes [provider]  gabapentin (NEURONTIN) 400 MG capsule Take 400 mg by mouth 3 (three) times daily.   Yes [provider]  ipratropium (ATROVENT) 0.02 % nebulizer solution Take 2.5 mLs (0.5 mg total) by nebulization every 6 (six) hours as needed for wheezing or shortness of breath. 07/07/17  Yes Johnson, Clanford L, MD  memantine (NAMENDA) 10 MG tablet Take 10 mg by mouth 2 (two) times daily.   Yes [provider]  pantoprazole (PROTONIX) 40 MG tablet Take 1 tablet (40 mg total) by mouth 2 (two) times daily before a meal. 07/07/17  Yes Johnson, Clanford L, MD  pramipexole (MIRAPEX) 0.125 MG tablet Take 0.125 mg by mouth at bedtime.   Yes [provider]  sennosides-docusate sodium (SENOKOT-S) 8.6-50 MG tablet Take 1 tablet by mouth daily as needed for constipation.   Yes [provider]  sertraline (ZOLOFT) 50 MG tablet Take 50 mg by mouth daily.   Yes [provider]  tamsulosin (FLOMAX) 0.4 MG CAPS capsule Take 0.4 mg by mouth daily as needed.    Yes [provider]  traMADol (ULTRAM) 50 MG tablet Take 1 tablet (50 mg total) by mouth every 6 (six) hours as needed for severe pain. 07/07/17  Yes Johnson, Clanford L, MD  traZODone (DESYREL) 100 MG tablet Take 200 mg by mouth at bedtime.   Yes [provider]  triamcinolone ointment (KENALOG) 0.5 % Apply 1 application topically 2 (two) times daily. Apply to affected areas of forearms twice daily.   Yes [provider]  Vitamin D, Ergocalciferol, (DRISDOL) 50000 units CAPS capsule Take 50,000 Units by mouth every 7 (seven) days.   Yes [provider]  neomycin-bacitracin-polymyxin (NEOSPORIN) ointment Apply topically 3 (three) times daily. Apply to scalp wound. Patient not taking: Reported on 09/09/2017 06/21/17   Arrien, Jimmy Picket, MD    Family History Family History  Family history unknown: Yes    Social History Social History   Tobacco Use  . Smoking status: Former Smoker    Packs/day: 0.50    Types: Cigarettes    Last attempt to quit: 06/09/2017    Years since quitting: 0.2  . Smokeless tobacco: Never Used  Substance Use Topics  . Alcohol use: No  . Drug use: No     Allergies   Bee venom; Penicillins; and Strawberry extract   Review of Systems Review of Systems  Constitutional: Negative for chills and fever.  Respiratory: Negative for shortness of breath.     Cardiovascular: Negative for chest pain.  Gastrointestinal: Negative for abdominal pain and vomiting.  Genitourinary: Negative for dysuria.  Musculoskeletal: Positive for arthralgias and joint swelling. Negative for back pain, neck pain and neck stiffness.  Skin: Negative for rash.  Neurological: Negative for light-headedness and headaches.     Physical Exam Updated Vital Signs BP 135/68   Pulse 76   Temp 98.3 F (36.8 C) (Temporal)   Resp 17   Ht 6\' 3"  (1.905 m)   Wt 83.9 kg (185 lb)   SpO2 98%   BMI 23.12 kg/m   Physical Exam Patient has moderate tenderness to palpation of lateral proximal humerus and lateral shoulder. No significant joint effusion.  No external sign of infection.  Pain with lateral/abduction. Patient alert and oriented, at baseline per worker. Abdomen soft nontender, no tenderness to lateral ribs.  Neck  supple full range of motion. Moist mucous membranes. Normal heart rate.   ED Treatments / Results  Labs (all labs ordered are listed, but only abnormal results are displayed) Labs Reviewed - No data to display  EKG None  Radiology Dg Shoulder Right  Result Date: 09/09/2017 CLINICAL DATA:  Pain after fall yesterday EXAM: RIGHT SHOULDER - 2+ VIEW COMPARISON:  December 24, 2016 FINDINGS: The mixed sclerotic and lucent lesion in the proximal humeral diaphysis is stable since October 2018. An MRI performed December 2018 suggested an old enchondroma. No fractures or dislocations in the shoulder. Calcific tendinosis noted in the soft tissues superior to the humeral head. Degenerative changes at the [facility] joint identified. IMPRESSION: 1. Stable mixed lucent and sclerotic lesion in the proximal humerus. Previous MRI suggested a benign etiology. 2. No acute fractures or dislocations. 3. Calcific tendinosis. Electronically Signed   By: Dorise Bullion III M.D   On: 09/09/2017 13:31    Procedures Procedures (including critical care time)  Medications Ordered in  ED Medications  traMADol (ULTRAM) tablet 50 mg (50 mg Oral Given 09/09/17 1417)     Initial Impression / Assessment and Plan / ED Course  I have reviewed the triage vital signs and the nursing notes.  Pertinent labs & imaging results that were available during my care of the patient were reviewed by me and considered in my medical decision making (see chart for details).    Patient presented after unwitnessed fall earlier this week and worsening right shoulder pain. No fevers or chills patient at baseline.  X-ray reviewed no acute findings. Patient stable for close outpatient follow-up.  Results and differential diagnosis were discussed with the patient/parent/guardian. Xrays were independently reviewed by myself.  Close follow up outpatient was discussed, comfortable with the plan.   Medications  traMADol (ULTRAM) tablet 50 mg (50 mg Oral Given 09/09/17 1417)    Vitals:   09/09/17 1246 09/09/17 1248 09/09/17 1300 09/09/17 1400  BP:  117/65 (!) 124/59 135/68  Pulse:  75 74 76  Resp:  14 16 17   Temp:  98.3 F (36.8 C)    TempSrc:  Temporal    SpO2:  96% 98% 98%  Weight: 83.9 kg (185 lb)     Height: 6\' 3"  (1.905 m)       Final diagnoses:  Right shoulder strain, initial encounter     Final Clinical Impressions(s) / ED Diagnoses   Final diagnoses:  Right shoulder strain, initial encounter    ED Discharge Orders    None       Elnora Morrison, MD 09/09/17 1456

## 2017-09-09 NOTE — Discharge Instructions (Addendum)
If you were given medicines take as directed.  If you are on coumadin or contraceptives realize their levels and effectiveness is altered by many different medicines.  If you have any reaction (rash, tongues swelling, other) to the medicines stop taking and see a physician.    If your blood pressure was elevated in the ER make sure you follow up for management with a primary doctor or return for chest pain, shortness of breath or stroke symptoms.  Please follow up as directed and return to the ER or see a physician for new or worsening symptoms.  Thank you. Vitals:   09/09/17 1246 09/09/17 1248 09/09/17 1300 09/09/17 1400  BP:  117/65 (!) 124/59 135/68  Pulse:  75 74 76  Resp:  14 16 17   Temp:  98.3 F (36.8 C)    TempSrc:  Temporal    SpO2:  96% 98% 98%  Weight: 83.9 kg (185 lb)     Height: 6\' 3"  (1.905 m)

## 2017-09-09 NOTE — ED Triage Notes (Signed)
Pt reports he has had shoulder pain for a week since a fall. SNF staff unaware of fall.

## 2017-09-12 DIAGNOSIS — T8189XD Other complications of procedures, not elsewhere classified, subsequent encounter: Secondary | ICD-10-CM | POA: Diagnosis not present

## 2017-09-12 DIAGNOSIS — F329 Major depressive disorder, single episode, unspecified: Secondary | ICD-10-CM | POA: Diagnosis not present

## 2017-09-12 DIAGNOSIS — N183 Chronic kidney disease, stage 3 (moderate): Secondary | ICD-10-CM | POA: Diagnosis not present

## 2017-09-12 DIAGNOSIS — Z9181 History of falling: Secondary | ICD-10-CM | POA: Diagnosis not present

## 2017-09-12 DIAGNOSIS — J449 Chronic obstructive pulmonary disease, unspecified: Secondary | ICD-10-CM | POA: Diagnosis not present

## 2017-09-12 DIAGNOSIS — F0390 Unspecified dementia without behavioral disturbance: Secondary | ICD-10-CM | POA: Diagnosis not present

## 2017-09-13 DIAGNOSIS — J449 Chronic obstructive pulmonary disease, unspecified: Secondary | ICD-10-CM | POA: Diagnosis not present

## 2017-09-13 DIAGNOSIS — T8189XD Other complications of procedures, not elsewhere classified, subsequent encounter: Secondary | ICD-10-CM | POA: Diagnosis not present

## 2017-09-13 DIAGNOSIS — F0390 Unspecified dementia without behavioral disturbance: Secondary | ICD-10-CM | POA: Diagnosis not present

## 2017-09-13 DIAGNOSIS — N183 Chronic kidney disease, stage 3 (moderate): Secondary | ICD-10-CM | POA: Diagnosis not present

## 2017-09-13 DIAGNOSIS — F329 Major depressive disorder, single episode, unspecified: Secondary | ICD-10-CM | POA: Diagnosis not present

## 2017-09-13 DIAGNOSIS — Z9181 History of falling: Secondary | ICD-10-CM | POA: Diagnosis not present

## 2017-09-20 DIAGNOSIS — G309 Alzheimer's disease, unspecified: Secondary | ICD-10-CM | POA: Diagnosis not present

## 2017-09-20 DIAGNOSIS — N183 Chronic kidney disease, stage 3 (moderate): Secondary | ICD-10-CM | POA: Diagnosis not present

## 2017-09-20 DIAGNOSIS — Z9181 History of falling: Secondary | ICD-10-CM | POA: Diagnosis not present

## 2017-09-20 DIAGNOSIS — F0390 Unspecified dementia without behavioral disturbance: Secondary | ICD-10-CM | POA: Diagnosis not present

## 2017-09-20 DIAGNOSIS — T8189XD Other complications of procedures, not elsewhere classified, subsequent encounter: Secondary | ICD-10-CM | POA: Diagnosis not present

## 2017-09-20 DIAGNOSIS — R2689 Other abnormalities of gait and mobility: Secondary | ICD-10-CM | POA: Diagnosis not present

## 2017-09-20 DIAGNOSIS — F329 Major depressive disorder, single episode, unspecified: Secondary | ICD-10-CM | POA: Diagnosis not present

## 2017-09-20 DIAGNOSIS — J449 Chronic obstructive pulmonary disease, unspecified: Secondary | ICD-10-CM | POA: Diagnosis not present

## 2017-09-21 ENCOUNTER — Encounter (HOSPITAL_COMMUNITY): Payer: Self-pay | Admitting: Emergency Medicine

## 2017-09-21 ENCOUNTER — Emergency Department (HOSPITAL_COMMUNITY)
Admission: EM | Admit: 2017-09-21 | Discharge: 2017-09-21 | Disposition: A | Discharge status: Home / Self Care | Payer: Medicare Other | Attending: Emergency Medicine | Admitting: Emergency Medicine

## 2017-09-21 ENCOUNTER — Other Ambulatory Visit: Payer: Self-pay

## 2017-09-21 DIAGNOSIS — Z7982 Long term (current) use of aspirin: Secondary | ICD-10-CM | POA: Diagnosis not present

## 2017-09-21 DIAGNOSIS — Z87891 Personal history of nicotine dependence: Secondary | ICD-10-CM | POA: Insufficient documentation

## 2017-09-21 DIAGNOSIS — E860 Dehydration: Secondary | ICD-10-CM

## 2017-09-21 DIAGNOSIS — F0390 Unspecified dementia without behavioral disturbance: Secondary | ICD-10-CM | POA: Diagnosis not present

## 2017-09-21 DIAGNOSIS — N183 Chronic kidney disease, stage 3 (moderate): Secondary | ICD-10-CM | POA: Diagnosis not present

## 2017-09-21 DIAGNOSIS — R531 Weakness: Secondary | ICD-10-CM | POA: Diagnosis not present

## 2017-09-21 DIAGNOSIS — Z79899 Other long term (current) drug therapy: Secondary | ICD-10-CM | POA: Diagnosis not present

## 2017-09-21 DIAGNOSIS — J45909 Unspecified asthma, uncomplicated: Secondary | ICD-10-CM | POA: Insufficient documentation

## 2017-09-21 LAB — URINALYSIS, ROUTINE W REFLEX MICROSCOPIC
Bacteria, UA: NONE SEEN
Bilirubin Urine: NEGATIVE
Glucose, UA: NEGATIVE mg/dL
Ketones, ur: NEGATIVE mg/dL
Nitrite: NEGATIVE
Protein, ur: NEGATIVE mg/dL
Specific Gravity, Urine: 1.005 (ref 1.005–1.030)
pH: 7 (ref 5.0–8.0)

## 2017-09-21 LAB — BASIC METABOLIC PANEL
Anion gap: 10 (ref 5–15)
BUN: 47 mg/dL — ABNORMAL HIGH (ref 8–23)
CO2: 23 mmol/L (ref 22–32)
Calcium: 8.6 mg/dL — ABNORMAL LOW (ref 8.9–10.3)
Chloride: 110 mmol/L (ref 98–111)
Creatinine, Ser: 2.71 mg/dL — ABNORMAL HIGH (ref 0.61–1.24)
GFR calc Af Amer: 24 mL/min — ABNORMAL LOW (ref >60)
GFR calc non Af Amer: 21 mL/min — ABNORMAL LOW (ref >60)
Glucose, Bld: 120 mg/dL — ABNORMAL HIGH (ref 70–99)
Potassium: 4 mmol/L (ref 3.5–5.1)
Sodium: 143 mmol/L (ref 135–145)

## 2017-09-21 LAB — CBC
HCT: 38.2 % — ABNORMAL LOW (ref 39.0–52.0)
Hemoglobin: 11.7 g/dL — ABNORMAL LOW (ref 13.0–17.0)
MCH: 29.9 pg (ref 26.0–34.0)
MCHC: 30.6 g/dL (ref 30.0–36.0)
MCV: 97.7 fL (ref 78.0–100.0)
Platelets: 252 10*3/uL (ref 150–400)
RBC: 3.91 MIL/uL — ABNORMAL LOW (ref 4.22–5.81)
RDW: 14.7 % (ref 11.5–15.5)
WBC: 9.6 10*3/uL (ref 4.0–10.5)

## 2017-09-21 LAB — CBG MONITORING, ED: Glucose-Capillary: 97 mg/dL (ref 70–99)

## 2017-09-21 MED ORDER — CEFTRIAXONE SODIUM 1 G IJ SOLR
1.0000 g | Freq: Once | INTRAMUSCULAR | Status: AC
  Administered 2017-09-21: 1 g via INTRAVENOUS
  Filled 2017-09-21: qty 10

## 2017-09-21 MED ORDER — CEPHALEXIN 500 MG PO CAPS: 500.0000 mg | ORAL_CAPSULE | Freq: Two times a day (BID) | ORAL | 0 refills | Status: DC

## 2017-09-21 MED ORDER — SODIUM CHLORIDE 0.9 % IV BOLUS
1000.0000 mL | Freq: Once | INTRAVENOUS | Status: AC
  Administered 2017-09-21: 1000 mL via INTRAVENOUS

## 2017-09-21 NOTE — ED Notes (Signed)
Bladder scanner revealed 392 mL.

## 2017-09-21 NOTE — ED Notes (Signed)
Teleneuro cart placed at bedside. 

## 2017-09-21 NOTE — ED Provider Notes (Signed)
[facility] EMERGENCY DEPARTMENT Provider Note   CSN: [id number] Arrival date & time: 09/21/17  1054     History   Chief Complaint Chief Complaint  Patient presents with  . Weakness    HPI Anthony [name] is a 77 y.o. male.  HPI   76yM with generalized weakness.  Patient himself is a very poor historian and does not add much to current symptoms.  Apparently has been progressively weaker over the past 2 weeks?  He denies any acute pain.  He denies feeling like he has had a fever.  No vomiting or diarrhea.  He has no acute urinary complaints.  He reports that his appetite is "so-so."  Noted to have an emergency room visit on 6/28.  He denies any falls since that time.  Past Medical History:  Diagnosis Date  . Acute pyelonephritis 12/23/2014  . Asthma   . BPH (benign prostatic hyperplasia)   . Chronic back pain   . Chronic hip pain   . COPD (chronic obstructive pulmonary disease) ([place])   . Dementia   . Depression   . GERD (gastroesophageal reflux disease)   . Kidney stone   . Neuropathy   . Pulmonary nodules 12/25/2014  . Restless leg syndrome     Patient Active Problem List   Diagnosis Date Noted  . Hematemesis 07/06/2017  . Normochromic normocytic anemia 06/19/2017  . CKD (chronic kidney disease) stage 3, GFR 30-59 ml/min (HCC) 06/19/2017  . Dementia 06/19/2017  . Decubitus ulcer 06/19/2017  . Open wnd of scalp   . S/p left hip fracture   . HCAP (healthcare-associated pneumonia) 10/31/2016  . CKD (chronic kidney disease) 10/31/2016  . COPD (chronic obstructive pulmonary disease) ([place]) 10/31/2016  . Hyponatremia 12/25/2014  . Hypokalemia 12/25/2014  . Normocytic anemia 12/25/2014  . Pulmonary nodules 12/25/2014  . Tobacco abuse 12/25/2014  . ARF (acute renal failure) ([place]) 12/24/2014  . Acute pyelonephritis 12/23/2014    Past Surgical History:  Procedure Laterality Date  . ABDOMINAL SURGERY     heria  . ESOPHAGOGASTRODUODENOSCOPY N/A 07/06/2017   Procedure:  ESOPHAGOGASTRODUODENOSCOPY (EGD);  Surgeon: Rogene Houston, MD;  [facility];  Service: Endoscopy;  Laterality: N/A;  . EYE SURGERY          Home Medications    Prior to Admission medications   Medication Sig Start Date End Date Taking? Authorizing Provider  acetaminophen (TYLENOL) 500 MG tablet Take 500 mg every 8 (eight) hours as needed by mouth for mild pain.   Yes [provider]  albuterol (PROVENTIL HFA;VENTOLIN HFA) 108 (90 BASE) MCG/ACT inhaler Inhale 2 puffs into the lungs every 4 (four) hours as needed for wheezing or shortness of breath.   Yes [provider]  albuterol (PROVENTIL) (2.5 MG/3ML) 0.083% nebulizer solution Take 3 mLs (2.5 mg total) by nebulization every 4 (four) hours as needed for wheezing or shortness of breath. 07/07/17  Yes Johnson, Clanford L, MD  aspirin EC 81 MG tablet Take 81 mg by mouth daily.   Yes [provider]  baclofen (LIORESAL) 10 MG tablet Take 10 mg by mouth 2 (two) times daily.   Yes [provider]  buPROPion (WELLBUTRIN SR) 150 MG 12 hr tablet Take 150 mg by mouth 2 (two) times daily.   Yes [provider]  Calcium Citrate-Vitamin D (CALCIUM CITRATE+D3 PETITES) 200-250 MG-UNIT TABS Take 1 tablet by mouth daily.   Yes [provider]  cholecalciferol (VITAMIN D) 1000 units tablet Take 2,000 Units  by mouth daily.   Yes [provider]  fluticasone (FLONASE) 50 MCG/ACT nasal spray Place 1 spray 2 (two) times daily into both nostrils.    Yes [provider]  Fluticasone-Salmeterol (ADVAIR) 250-50 MCG/DOSE AEPB Inhale 1 puff 2 (two) times daily into the lungs.   Yes [provider]  gabapentin (NEURONTIN) 400 MG capsule Take 400 mg by mouth 3 (three) times daily.   Yes [provider]  ipratropium (ATROVENT) 0.02 % nebulizer solution Take 2.5 mLs (0.5 mg total) by nebulization every 6 (six) hours as needed for wheezing or shortness of breath. 07/07/17   Yes Johnson, Clanford L, MD  memantine (NAMENDA) 10 MG tablet Take 10 mg by mouth 2 (two) times daily.   Yes [provider]  pantoprazole (PROTONIX) 40 MG tablet Take 1 tablet (40 mg total) by mouth 2 (two) times daily before a meal. 07/07/17  Yes Johnson, Clanford L, MD  pramipexole (MIRAPEX) 0.125 MG tablet Take 0.125 mg by mouth at bedtime.   Yes [provider]  sennosides-docusate sodium (SENOKOT-S) 8.6-50 MG tablet Take 1 tablet by mouth daily as needed for constipation.   Yes [provider]  sertraline (ZOLOFT) 50 MG tablet Take 50 mg by mouth daily.   Yes [provider]  tamsulosin (FLOMAX) 0.4 MG CAPS capsule Take 0.4 mg by mouth daily as needed.    Yes [provider]  traMADol (ULTRAM) 50 MG tablet Take 1 tablet (50 mg total) by mouth every 6 (six) hours as needed for severe pain. 07/07/17  Yes Johnson, Clanford L, MD  traZODone (DESYREL) 100 MG tablet Take 200 mg by mouth at bedtime.   Yes [provider]  triamcinolone ointment (KENALOG) 0.5 % Apply 1 application topically 2 (two) times daily. Apply to affected areas of forearms twice daily.   Yes [provider]  Vitamin D, Ergocalciferol, (DRISDOL) 50000 units CAPS capsule Take 50,000 Units by mouth every 7 (seven) days.   Yes [provider]  neomycin-bacitracin-polymyxin (NEOSPORIN) ointment Apply topically 3 (three) times daily. Apply to scalp wound. Patient not taking: Reported on 09/09/2017 06/21/17   Arrien, Jimmy Picket, MD    Family History Family History  Family history unknown: Yes    Social History Social History   Tobacco Use  . Smoking status: Former Smoker    Packs/day: 0.50    Types: Cigarettes    Last attempt to quit: 06/09/2017    Years since quitting: 0.2  . Smokeless tobacco: Never Used  Substance Use Topics  . Alcohol use: No  . Drug use: No     Allergies   Bee venom; Penicillins; and Strawberry extract   Review of  Systems Review of Systems  All systems reviewed and negative, other than as noted in HPI.  Physical Exam Updated Vital Signs BP (!) 107/54   Pulse 68   Temp 97.6 F (36.4 C) (Oral)   Resp 14   Ht 6\' 3"  (1.905 m)   Wt 83.9 kg (185 lb)   SpO2 99%   BMI 23.12 kg/m   Physical Exam  Constitutional: He appears well-developed and well-nourished. No distress.  Laying in bed. NAD.   HENT:  Head: Normocephalic and atraumatic.  Eyes: Conjunctivae are normal. Right eye exhibits no discharge. Left eye exhibits no discharge.  Neck: Neck supple.  Cardiovascular: Normal rate, regular rhythm and normal heart sounds. Exam reveals no gallop and no friction rub.  No murmur heard. Pulmonary/Chest: Effort normal and breath sounds normal.  No respiratory distress.  Abdominal: Soft. He exhibits no distension. There is no tenderness.  Musculoskeletal: He exhibits no edema or tenderness.  Neurological: He is alert.  Answering questions appropriately but speech is somewhat dysarthric and difficult to understand. No focal motor deficits.   Skin: Skin is warm and dry.  Psychiatric: He has a normal mood and affect. His behavior is normal. Thought content normal.  Nursing note and vitals reviewed.    ED Treatments / Results  Labs (all labs ordered are listed, but only abnormal results are displayed) Labs Reviewed  BASIC METABOLIC PANEL - Abnormal; Notable for the following components:      Result Value   Glucose, Bld 120 (*)    BUN 47 (*)    Creatinine, Ser 2.71 (*)    Calcium 8.6 (*)    GFR calc non Af Amer 21 (*)    GFR calc Af Amer 24 (*)    All other components within normal limits  CBC - Abnormal; Notable for the following components:   RBC 3.91 (*)    Hemoglobin 11.7 (*)    HCT 38.2 (*)    All other components within normal limits  URINALYSIS, ROUTINE W REFLEX MICROSCOPIC - Abnormal; Notable for the following components:   Color, Urine STRAW (*)    Hgb urine dipstick SMALL (*)     Leukocytes, UA LARGE (*)    All other components within normal limits  URINE CULTURE  CBG MONITORING, ED    EKG None  Radiology No results found.  Procedures Procedures (including critical care time)  Medications Ordered in ED Medications  sodium chloride 0.9 % bolus 1,000 mL (1,000 mLs Intravenous New Bag/Given 09/21/17 1355)  cefTRIAXone (ROCEPHIN) 1 g in sodium chloride 0.9 % 100 mL IVPB (1 g Intravenous New Bag/Given 09/21/17 1440)     Initial Impression / Assessment and Plan / ED Course  I have reviewed the triage vital signs and the nursing notes.  Pertinent labs & imaging results that were available during my care of the patient were reviewed by me and considered in my medical decision making (see chart for details).     77 year old male with generalized weakness.  He really has no acute complaints.  Creatinine is elevated from his baseline.  There may be some degree of dehydration.  He was treated with IV fluids.  He was somewhat equivocal for UTI.  We will go ahead and treat for possible UTI given the fact that he does not seem like he is a reliable historian.  He appears tired, but not toxic.  He is afebrile.  Blood pressure is on the softer side, but historically appears to run in this range.  I doubt sepsis or other emergent condition.  He will be discharged back to skilled nursing facility.  Final Clinical Impressions(s) / ED Diagnoses   Final diagnoses:  Generalized weakness  Dehydration    ED Discharge Orders    None       Virgel Manifold, MD 09/23/17 [phone]

## 2017-09-21 NOTE — ED Triage Notes (Signed)
Pt from [facility] for increased weakness. When asked why pt is here today pt states he does not know, denies pain.

## 2017-09-21 NOTE — ED Notes (Signed)
Pt unable to provide urine specimen.  

## 2017-09-21 NOTE — ED Notes (Signed)
Report given to Butch Penny at [facility]. Will be sending transport at this time per RN.

## 2017-09-22 DIAGNOSIS — T8189XD Other complications of procedures, not elsewhere classified, subsequent encounter: Secondary | ICD-10-CM | POA: Diagnosis not present

## 2017-09-22 DIAGNOSIS — Z9181 History of falling: Secondary | ICD-10-CM | POA: Diagnosis not present

## 2017-09-22 DIAGNOSIS — F0390 Unspecified dementia without behavioral disturbance: Secondary | ICD-10-CM | POA: Diagnosis not present

## 2017-09-22 DIAGNOSIS — N183 Chronic kidney disease, stage 3 (moderate): Secondary | ICD-10-CM | POA: Diagnosis not present

## 2017-09-22 DIAGNOSIS — F329 Major depressive disorder, single episode, unspecified: Secondary | ICD-10-CM | POA: Diagnosis not present

## 2017-09-22 DIAGNOSIS — J449 Chronic obstructive pulmonary disease, unspecified: Secondary | ICD-10-CM | POA: Diagnosis not present

## 2017-09-24 LAB — URINE CULTURE: Culture: >=100000 — AB

## 2017-09-26 DIAGNOSIS — N183 Chronic kidney disease, stage 3 (moderate): Secondary | ICD-10-CM | POA: Diagnosis not present

## 2017-09-26 DIAGNOSIS — Z9181 History of falling: Secondary | ICD-10-CM | POA: Diagnosis not present

## 2017-09-26 DIAGNOSIS — J449 Chronic obstructive pulmonary disease, unspecified: Secondary | ICD-10-CM | POA: Diagnosis not present

## 2017-09-26 DIAGNOSIS — F0390 Unspecified dementia without behavioral disturbance: Secondary | ICD-10-CM | POA: Diagnosis not present

## 2017-09-26 DIAGNOSIS — F329 Major depressive disorder, single episode, unspecified: Secondary | ICD-10-CM | POA: Diagnosis not present

## 2017-09-26 DIAGNOSIS — T8189XD Other complications of procedures, not elsewhere classified, subsequent encounter: Secondary | ICD-10-CM | POA: Diagnosis not present

## 2017-09-27 ENCOUNTER — Emergency Department (HOSPITAL_COMMUNITY): Payer: Medicare Other

## 2017-09-27 ENCOUNTER — Encounter (HOSPITAL_COMMUNITY): Payer: Self-pay | Admitting: Emergency Medicine

## 2017-09-27 ENCOUNTER — Emergency Department (HOSPITAL_COMMUNITY)
Admission: EM | Admit: 2017-09-27 | Discharge: 2017-09-27 | Disposition: A | Discharge status: Home / Self Care | Payer: Medicare Other | Attending: Emergency Medicine | Admitting: Emergency Medicine

## 2017-09-27 DIAGNOSIS — R531 Weakness: Secondary | ICD-10-CM | POA: Diagnosis not present

## 2017-09-27 DIAGNOSIS — Z87891 Personal history of nicotine dependence: Secondary | ICD-10-CM | POA: Diagnosis not present

## 2017-09-27 DIAGNOSIS — N183 Chronic kidney disease, stage 3 (moderate): Secondary | ICD-10-CM | POA: Insufficient documentation

## 2017-09-27 DIAGNOSIS — F0390 Unspecified dementia without behavioral disturbance: Secondary | ICD-10-CM | POA: Insufficient documentation

## 2017-09-27 DIAGNOSIS — M25552 Pain in left hip: Secondary | ICD-10-CM | POA: Insufficient documentation

## 2017-09-27 DIAGNOSIS — J45909 Unspecified asthma, uncomplicated: Secondary | ICD-10-CM | POA: Insufficient documentation

## 2017-09-27 DIAGNOSIS — Z7982 Long term (current) use of aspirin: Secondary | ICD-10-CM | POA: Insufficient documentation

## 2017-09-27 DIAGNOSIS — S79912A Unspecified injury of left hip, initial encounter: Secondary | ICD-10-CM | POA: Diagnosis not present

## 2017-09-27 DIAGNOSIS — W050XXA Fall from non-moving wheelchair, initial encounter: Secondary | ICD-10-CM | POA: Insufficient documentation

## 2017-09-27 DIAGNOSIS — Z79899 Other long term (current) drug therapy: Secondary | ICD-10-CM | POA: Diagnosis not present

## 2017-09-27 DIAGNOSIS — R0902 Hypoxemia: Secondary | ICD-10-CM | POA: Diagnosis not present

## 2017-09-27 NOTE — ED Provider Notes (Signed)
[facility] EMERGENCY DEPARTMENT Provider Note   CSN: [id number] Arrival date & time: 09/27/17  5102     History   Chief Complaint Chief Complaint  Patient presents with  . Fall    HPI Anthony [name] is a 77 y.o. male.  Level 5 caveat for dementia.  Patient resides at [facility].  Patient allegedly fell out of his wheelchair while going to the bathroom striking his left hip.  No other injuries.  Baseline behavior.  No head or neck trauma.     Past Medical History:  Diagnosis Date  . Acute pyelonephritis 12/23/2014  . Asthma   . BPH (benign prostatic hyperplasia)   . Chronic back pain   . Chronic hip pain   . COPD (chronic obstructive pulmonary disease) ([place])   . Dementia   . Depression   . GERD (gastroesophageal reflux disease)   . Kidney stone   . Neuropathy   . Pulmonary nodules 12/25/2014  . Restless leg syndrome     Patient Active Problem List   Diagnosis Date Noted  . Hematemesis 07/06/2017  . Normochromic normocytic anemia 06/19/2017  . CKD (chronic kidney disease) stage 3, GFR 30-59 ml/min (HCC) 06/19/2017  . Dementia 06/19/2017  . Decubitus ulcer 06/19/2017  . Open wnd of scalp   . S/p left hip fracture   . HCAP (healthcare-associated pneumonia) 10/31/2016  . CKD (chronic kidney disease) 10/31/2016  . COPD (chronic obstructive pulmonary disease) ([place]) 10/31/2016  . Hyponatremia 12/25/2014  . Hypokalemia 12/25/2014  . Normocytic anemia 12/25/2014  . Pulmonary nodules 12/25/2014  . Tobacco abuse 12/25/2014  . ARF (acute renal failure) ([place]) 12/24/2014  . Acute pyelonephritis 12/23/2014    Past Surgical History:  Procedure Laterality Date  . ABDOMINAL SURGERY     heria  . ESOPHAGOGASTRODUODENOSCOPY N/A 07/06/2017   Procedure: ESOPHAGOGASTRODUODENOSCOPY (EGD);  Surgeon: Rogene Houston, MD;  Location: [facility];  Service: Endoscopy;  Laterality: N/A;  . EYE SURGERY          Home Medications    Prior to Admission medications    Medication Sig Start Date End Date Taking? Authorizing Provider  acetaminophen (TYLENOL) 500 MG tablet Take 500 mg every 8 (eight) hours as needed by mouth for mild pain.   Yes [provider]  albuterol (PROVENTIL HFA;VENTOLIN HFA) 108 (90 BASE) MCG/ACT inhaler Inhale 2 puffs into the lungs every 4 (four) hours as needed for wheezing or shortness of breath.   Yes [provider]  albuterol (PROVENTIL) (2.5 MG/3ML) 0.083% nebulizer solution Take 3 mLs (2.5 mg total) by nebulization every 4 (four) hours as needed for wheezing or shortness of breath. 07/07/17  Yes Johnson, Clanford L, MD  aspirin EC 81 MG tablet Take 81 mg by mouth daily.   Yes [provider]  baclofen (LIORESAL) 10 MG tablet Take 10 mg by mouth 2 (two) times daily.   Yes [provider]  buPROPion (WELLBUTRIN SR) 150 MG 12 hr tablet Take 150 mg by mouth 2 (two) times daily.   Yes [provider]  Calcium Citrate-Vitamin D (CALCIUM CITRATE+D3 PETITES) 200-250 MG-UNIT TABS Take 1 tablet by mouth daily.   Yes [provider]  cephALEXin (KEFLEX) 500 MG capsule Take 1 capsule (500 mg total) by mouth 2 (two) times daily. 09/21/17  Yes Virgel Manifold, MD  cholecalciferol (VITAMIN D) 1000 units tablet Take 2,000 Units by mouth daily.   Yes [provider]  fluticasone (FLONASE) 50 MCG/ACT nasal spray Place 1  spray 2 (two) times daily into both nostrils.    Yes [provider]  Fluticasone-Salmeterol (ADVAIR) 250-50 MCG/DOSE AEPB Inhale 1 puff 2 (two) times daily into the lungs.   Yes [provider]  gabapentin (NEURONTIN) 400 MG capsule Take 400 mg by mouth 3 (three) times daily.   Yes [provider]  ipratropium (ATROVENT) 0.02 % nebulizer solution Take 2.5 mLs (0.5 mg total) by nebulization every 6 (six) hours as needed for wheezing or shortness of breath. 07/07/17  Yes Johnson, Clanford L, MD  memantine (NAMENDA) 10 MG tablet Take 10 mg by mouth 2  (two) times daily.   Yes [provider]  pantoprazole (PROTONIX) 40 MG tablet Take 1 tablet (40 mg total) by mouth 2 (two) times daily before a meal. 07/07/17  Yes Johnson, Clanford L, MD  pramipexole (MIRAPEX) 0.125 MG tablet Take 0.125 mg by mouth at bedtime.   Yes [provider]  sennosides-docusate sodium (SENOKOT-S) 8.6-50 MG tablet Take 1 tablet by mouth daily as needed for constipation.   Yes [provider]  sertraline (ZOLOFT) 50 MG tablet Take 50 mg by mouth daily.   Yes [provider]  tamsulosin (FLOMAX) 0.4 MG CAPS capsule Take 0.4 mg by mouth daily as needed.    Yes [provider]  traMADol (ULTRAM) 50 MG tablet Take 1 tablet (50 mg total) by mouth every 6 (six) hours as needed for severe pain. 07/07/17  Yes Johnson, Clanford L, MD  traZODone (DESYREL) 100 MG tablet Take 200 mg by mouth at bedtime.   Yes [provider]  triamcinolone ointment (KENALOG) 0.5 % Apply 1 application topically 2 (two) times daily. Apply to affected areas of forearms twice daily.   Yes [provider]  Vitamin D, Ergocalciferol, (DRISDOL) 50000 units CAPS capsule Take 50,000 Units by mouth every 7 (seven) days.   Yes [provider]  neomycin-bacitracin-polymyxin (NEOSPORIN) ointment Apply topically 3 (three) times daily. Apply to scalp wound. Patient not taking: Reported on 09/09/2017 06/21/17   Arrien, Jimmy Picket, MD    Family History Family History  Family history unknown: Yes    Social History Social History   Tobacco Use  . Smoking status: Former Smoker    Packs/day: 0.50    Types: Cigarettes    Last attempt to quit: 06/09/2017    Years since quitting: 0.3  . Smokeless tobacco: Never Used  Substance Use Topics  . Alcohol use: No  . Drug use: No     Allergies   Bee venom; Penicillins; and Strawberry extract   Review of Systems Review of Systems  Unable to perform ROS: Dementia     Physical Exam Updated  Vital Signs BP 111/62 (BP Location: Right Arm)   Pulse 72   Temp 99.4 F (37.4 C) (Rectal)   Resp (!) 7   Ht 6\' 3"  (1.905 m)   Wt 83.9 kg (185 lb)   SpO2 94%   BMI 23.12 kg/m   Physical Exam  Constitutional:  nad  HENT:  Head: Normocephalic and atraumatic.  Eyes: Conjunctivae are normal.  Neck: Neck supple.  Cardiovascular: Normal rate and regular rhythm.  Pulmonary/Chest: Effort normal and breath sounds normal.  Abdominal: Soft. Bowel sounds are normal.  Musculoskeletal:  Minimal tenderness left lateral hip.  Neurological: He is alert.  Skin: Skin is warm and dry.  Psychiatric:  Pleasant, demented  Nursing note and vitals reviewed.    ED Treatments / Results  Labs (all labs ordered are listed,  but only abnormal results are displayed) Labs Reviewed - No data to display  EKG None  Radiology Dg Hip Unilat W Or Wo Pelvis 2-3 Views Left  Result Date: 09/27/2017 CLINICAL DATA:  Fall.  Left hip pain EXAM: DG HIP (WITH OR WITHOUT PELVIS) 2-3V LEFT COMPARISON:  09/17/2016 FINDINGS: Evidence of prior internal fixation in the right hip region. Mild degenerative changes in the hips bilaterally with spurring. No acute fracture, subluxation or dislocation. IMPRESSION: No acute bony abnormality. Electronically Signed   By: Rolm Baptise M.D.   On: 09/27/2017 09:00    Procedures Procedures (including critical care time)  Medications Ordered in ED Medications - No data to display   Initial Impression / Assessment and Plan / ED Course  I have reviewed the triage vital signs and the nursing notes.  Pertinent labs & imaging results that were available during my care of the patient were reviewed by me and considered in my medical decision making (see chart for details).     Patient presents with left hip pain after fall.  Plain films of hip negative.  Orthopedic follow-up if symptoms persist.  Final Clinical Impressions(s) / ED Diagnoses   Final diagnoses:  Left hip pain      ED Discharge Orders    None       Nat Christen, MD 09/27/17 [phone]

## 2017-09-27 NOTE — ED Triage Notes (Signed)
Pt came in by EMS from [facility] for a fall. Pt fell out of wheelchair while going to the bathroom. Pt is altered, has history of dementia, pt is currently oriented to self and is able to state his birthday. Pt states he remembers falling and states his left hip hurts.EMS CBG 107, temp 99. 90% saturation on 2L [place].

## 2017-09-27 NOTE — Discharge Instructions (Addendum)
X-ray of left hip showed no fracture.   Follow-up with orthopedics if not improving.

## 2017-09-27 NOTE — ED Notes (Signed)
Anthony [name] called and given report regarding pt' discharge.  Anthony Anthony [name] stated they would pick pt up from ER but could not give this RN an ETA.

## 2017-09-27 NOTE — ED Notes (Signed)
ED Provider at bedside. 

## 2017-09-28 DIAGNOSIS — Z9181 History of falling: Secondary | ICD-10-CM | POA: Diagnosis not present

## 2017-09-28 DIAGNOSIS — F0390 Unspecified dementia without behavioral disturbance: Secondary | ICD-10-CM | POA: Diagnosis not present

## 2017-09-28 DIAGNOSIS — N183 Chronic kidney disease, stage 3 (moderate): Secondary | ICD-10-CM | POA: Diagnosis not present

## 2017-09-28 DIAGNOSIS — T8189XD Other complications of procedures, not elsewhere classified, subsequent encounter: Secondary | ICD-10-CM | POA: Diagnosis not present

## 2017-09-28 DIAGNOSIS — F329 Major depressive disorder, single episode, unspecified: Secondary | ICD-10-CM | POA: Diagnosis not present

## 2017-09-28 DIAGNOSIS — J449 Chronic obstructive pulmonary disease, unspecified: Secondary | ICD-10-CM | POA: Diagnosis not present

## 2017-09-29 DIAGNOSIS — F329 Major depressive disorder, single episode, unspecified: Secondary | ICD-10-CM | POA: Diagnosis not present

## 2017-09-29 DIAGNOSIS — F0390 Unspecified dementia without behavioral disturbance: Secondary | ICD-10-CM | POA: Diagnosis not present

## 2017-09-29 DIAGNOSIS — T8189XD Other complications of procedures, not elsewhere classified, subsequent encounter: Secondary | ICD-10-CM | POA: Diagnosis not present

## 2017-09-29 DIAGNOSIS — J449 Chronic obstructive pulmonary disease, unspecified: Secondary | ICD-10-CM | POA: Diagnosis not present

## 2017-09-29 DIAGNOSIS — Z9181 History of falling: Secondary | ICD-10-CM | POA: Diagnosis not present

## 2017-09-29 DIAGNOSIS — N183 Chronic kidney disease, stage 3 (moderate): Secondary | ICD-10-CM | POA: Diagnosis not present

## 2017-10-03 DIAGNOSIS — F0390 Unspecified dementia without behavioral disturbance: Secondary | ICD-10-CM | POA: Diagnosis not present

## 2017-10-03 DIAGNOSIS — N183 Chronic kidney disease, stage 3 (moderate): Secondary | ICD-10-CM | POA: Diagnosis not present

## 2017-10-03 DIAGNOSIS — Z9181 History of falling: Secondary | ICD-10-CM | POA: Diagnosis not present

## 2017-10-03 DIAGNOSIS — F329 Major depressive disorder, single episode, unspecified: Secondary | ICD-10-CM | POA: Diagnosis not present

## 2017-10-03 DIAGNOSIS — J449 Chronic obstructive pulmonary disease, unspecified: Secondary | ICD-10-CM | POA: Diagnosis not present

## 2017-10-03 DIAGNOSIS — T8189XD Other complications of procedures, not elsewhere classified, subsequent encounter: Secondary | ICD-10-CM | POA: Diagnosis not present

## 2017-10-05 DIAGNOSIS — J449 Chronic obstructive pulmonary disease, unspecified: Secondary | ICD-10-CM | POA: Diagnosis not present

## 2017-10-05 DIAGNOSIS — N183 Chronic kidney disease, stage 3 (moderate): Secondary | ICD-10-CM | POA: Diagnosis not present

## 2017-10-05 DIAGNOSIS — F0390 Unspecified dementia without behavioral disturbance: Secondary | ICD-10-CM | POA: Diagnosis not present

## 2017-10-05 DIAGNOSIS — F329 Major depressive disorder, single episode, unspecified: Secondary | ICD-10-CM | POA: Diagnosis not present

## 2017-10-05 DIAGNOSIS — T8189XD Other complications of procedures, not elsewhere classified, subsequent encounter: Secondary | ICD-10-CM | POA: Diagnosis not present

## 2017-10-05 DIAGNOSIS — Z9181 History of falling: Secondary | ICD-10-CM | POA: Diagnosis not present

## 2017-10-07 DIAGNOSIS — G309 Alzheimer's disease, unspecified: Secondary | ICD-10-CM | POA: Diagnosis not present

## 2017-10-07 DIAGNOSIS — Z79899 Other long term (current) drug therapy: Secondary | ICD-10-CM | POA: Diagnosis not present

## 2017-10-07 DIAGNOSIS — J449 Chronic obstructive pulmonary disease, unspecified: Secondary | ICD-10-CM | POA: Diagnosis not present

## 2017-10-07 DIAGNOSIS — N183 Chronic kidney disease, stage 3 (moderate): Secondary | ICD-10-CM | POA: Diagnosis not present

## 2017-10-07 DIAGNOSIS — K219 Gastro-esophageal reflux disease without esophagitis: Secondary | ICD-10-CM | POA: Diagnosis not present

## 2017-10-10 ENCOUNTER — Inpatient Hospital Stay (HOSPITAL_COMMUNITY)
Admission: EM | Admit: 2017-10-10 | Discharge: 2017-10-14 | DRG: 193 | Disposition: A | Discharge status: Skilled Nursing Facility | Payer: Medicare Other | Source: Skilled Nursing Facility | Attending: Internal Medicine | Admitting: Internal Medicine

## 2017-10-10 ENCOUNTER — Emergency Department (HOSPITAL_COMMUNITY): Payer: Medicare Other

## 2017-10-10 ENCOUNTER — Other Ambulatory Visit: Payer: Self-pay

## 2017-10-10 ENCOUNTER — Encounter (HOSPITAL_COMMUNITY): Payer: Self-pay | Admitting: Emergency Medicine

## 2017-10-10 DIAGNOSIS — Z1623 Resistance to quinolones and fluoroquinolones: Secondary | ICD-10-CM | POA: Diagnosis present

## 2017-10-10 DIAGNOSIS — N390 Urinary tract infection, site not specified: Secondary | ICD-10-CM | POA: Diagnosis present

## 2017-10-10 DIAGNOSIS — G629 Polyneuropathy, unspecified: Secondary | ICD-10-CM | POA: Diagnosis present

## 2017-10-10 DIAGNOSIS — R55 Syncope and collapse: Secondary | ICD-10-CM | POA: Diagnosis not present

## 2017-10-10 DIAGNOSIS — M25559 Pain in unspecified hip: Secondary | ICD-10-CM | POA: Diagnosis present

## 2017-10-10 DIAGNOSIS — Z880 Allergy status to penicillin: Secondary | ICD-10-CM | POA: Diagnosis not present

## 2017-10-10 DIAGNOSIS — N170 Acute kidney failure with tubular necrosis: Secondary | ICD-10-CM | POA: Diagnosis present

## 2017-10-10 DIAGNOSIS — Z91030 Bee allergy status: Secondary | ICD-10-CM

## 2017-10-10 DIAGNOSIS — N320 Bladder-neck obstruction: Secondary | ICD-10-CM | POA: Diagnosis present

## 2017-10-10 DIAGNOSIS — N400 Enlarged prostate without lower urinary tract symptoms: Secondary | ICD-10-CM | POA: Diagnosis present

## 2017-10-10 DIAGNOSIS — F0390 Unspecified dementia without behavioral disturbance: Secondary | ICD-10-CM | POA: Diagnosis present

## 2017-10-10 DIAGNOSIS — Z91018 Allergy to other foods: Secondary | ICD-10-CM | POA: Diagnosis not present

## 2017-10-10 DIAGNOSIS — E861 Hypovolemia: Secondary | ICD-10-CM | POA: Diagnosis present

## 2017-10-10 DIAGNOSIS — J449 Chronic obstructive pulmonary disease, unspecified: Secondary | ICD-10-CM | POA: Diagnosis present

## 2017-10-10 DIAGNOSIS — J189 Pneumonia, unspecified organism: Secondary | ICD-10-CM | POA: Diagnosis not present

## 2017-10-10 DIAGNOSIS — J410 Simple chronic bronchitis: Secondary | ICD-10-CM | POA: Diagnosis not present

## 2017-10-10 DIAGNOSIS — K219 Gastro-esophageal reflux disease without esophagitis: Secondary | ICD-10-CM | POA: Diagnosis present

## 2017-10-10 DIAGNOSIS — D649 Anemia, unspecified: Secondary | ICD-10-CM | POA: Diagnosis present

## 2017-10-10 DIAGNOSIS — Z7952 Long term (current) use of systemic steroids: Secondary | ICD-10-CM

## 2017-10-10 DIAGNOSIS — J440 Chronic obstructive pulmonary disease with acute lower respiratory infection: Secondary | ICD-10-CM | POA: Diagnosis present

## 2017-10-10 DIAGNOSIS — M898X9 Other specified disorders of bone, unspecified site: Secondary | ICD-10-CM | POA: Diagnosis present

## 2017-10-10 DIAGNOSIS — R918 Other nonspecific abnormal finding of lung field: Secondary | ICD-10-CM | POA: Diagnosis present

## 2017-10-10 DIAGNOSIS — G2581 Restless legs syndrome: Secondary | ICD-10-CM | POA: Diagnosis present

## 2017-10-10 DIAGNOSIS — I10 Essential (primary) hypertension: Secondary | ICD-10-CM | POA: Diagnosis not present

## 2017-10-10 DIAGNOSIS — I959 Hypotension, unspecified: Secondary | ICD-10-CM | POA: Diagnosis present

## 2017-10-10 DIAGNOSIS — B952 Enterococcus as the cause of diseases classified elsewhere: Secondary | ICD-10-CM | POA: Diagnosis present

## 2017-10-10 DIAGNOSIS — Z79891 Long term (current) use of opiate analgesic: Secondary | ICD-10-CM

## 2017-10-10 DIAGNOSIS — Y95 Nosocomial condition: Secondary | ICD-10-CM | POA: Diagnosis present

## 2017-10-10 DIAGNOSIS — Z87891 Personal history of nicotine dependence: Secondary | ICD-10-CM | POA: Diagnosis not present

## 2017-10-10 DIAGNOSIS — N179 Acute kidney failure, unspecified: Secondary | ICD-10-CM | POA: Insufficient documentation

## 2017-10-10 DIAGNOSIS — I129 Hypertensive chronic kidney disease with stage 1 through stage 4 chronic kidney disease, or unspecified chronic kidney disease: Secondary | ICD-10-CM | POA: Diagnosis present

## 2017-10-10 DIAGNOSIS — Z7951 Long term (current) use of inhaled steroids: Secondary | ICD-10-CM

## 2017-10-10 DIAGNOSIS — N189 Chronic kidney disease, unspecified: Secondary | ICD-10-CM | POA: Diagnosis not present

## 2017-10-10 DIAGNOSIS — N136 Pyonephrosis: Secondary | ICD-10-CM | POA: Diagnosis present

## 2017-10-10 DIAGNOSIS — Z87442 Personal history of urinary calculi: Secondary | ICD-10-CM | POA: Diagnosis not present

## 2017-10-10 DIAGNOSIS — N183 Chronic kidney disease, stage 3 unspecified: Secondary | ICD-10-CM | POA: Diagnosis present

## 2017-10-10 DIAGNOSIS — Z79899 Other long term (current) drug therapy: Secondary | ICD-10-CM

## 2017-10-10 DIAGNOSIS — G8929 Other chronic pain: Secondary | ICD-10-CM | POA: Diagnosis present

## 2017-10-10 DIAGNOSIS — N2581 Secondary hyperparathyroidism of renal origin: Secondary | ICD-10-CM | POA: Diagnosis present

## 2017-10-10 DIAGNOSIS — R531 Weakness: Secondary | ICD-10-CM | POA: Diagnosis not present

## 2017-10-10 DIAGNOSIS — S0990XA Unspecified injury of head, initial encounter: Secondary | ICD-10-CM | POA: Diagnosis not present

## 2017-10-10 DIAGNOSIS — F329 Major depressive disorder, single episode, unspecified: Secondary | ICD-10-CM | POA: Diagnosis present

## 2017-10-10 DIAGNOSIS — R0902 Hypoxemia: Secondary | ICD-10-CM | POA: Diagnosis not present

## 2017-10-10 DIAGNOSIS — I447 Left bundle-branch block, unspecified: Secondary | ICD-10-CM | POA: Diagnosis present

## 2017-10-10 DIAGNOSIS — Z7982 Long term (current) use of aspirin: Secondary | ICD-10-CM

## 2017-10-10 DIAGNOSIS — S199XXA Unspecified injury of neck, initial encounter: Secondary | ICD-10-CM | POA: Diagnosis not present

## 2017-10-10 HISTORY — DX: Chronic kidney disease, unspecified: N18.9

## 2017-10-10 LAB — URINALYSIS, ROUTINE W REFLEX MICROSCOPIC
Bilirubin Urine: NEGATIVE
GLUCOSE, UA: NEGATIVE mg/dL
KETONES UR: NEGATIVE mg/dL
Nitrite: NEGATIVE
PROTEIN: NEGATIVE mg/dL
Specific Gravity, Urine: 1.008 (ref 1.005–1.030)
pH: 5 (ref 5.0–8.0)

## 2017-10-10 LAB — COMPREHENSIVE METABOLIC PANEL
ALK PHOS: 65 U/L (ref 38–126)
ALT: 8 U/L (ref 0–44)
AST: 13 U/L — ABNORMAL LOW (ref 15–41)
Albumin: 3.3 g/dL — ABNORMAL LOW (ref 3.5–5.0)
Anion gap: 9 (ref 5–15)
BUN: 49 mg/dL — AB (ref 8–23)
CALCIUM: 8.4 mg/dL — AB (ref 8.9–10.3)
CO2: 23 mmol/L (ref 22–32)
CREATININE: 3.14 mg/dL — AB (ref 0.61–1.24)
Chloride: 111 mmol/L (ref 98–111)
GFR, EST AFRICAN AMERICAN: 20 mL/min — AB (ref >60)
GFR, EST NON AFRICAN AMERICAN: 17 mL/min — AB (ref >60)
Glucose, Bld: 89 mg/dL (ref 70–99)
Potassium: 4.2 mmol/L (ref 3.5–5.1)
Sodium: 143 mmol/L (ref 135–145)
Total Bilirubin: 0.5 mg/dL (ref 0.3–1.2)
Total Protein: 7.7 g/dL (ref 6.5–8.1)

## 2017-10-10 LAB — CBC WITH DIFFERENTIAL/PLATELET
BASOS PCT: 0 %
Basophils Absolute: 0 10*3/uL (ref 0.0–0.1)
EOS PCT: 6 %
Eosinophils Absolute: 0.5 10*3/uL (ref 0.0–0.7)
HCT: 33.9 % — ABNORMAL LOW (ref 39.0–52.0)
Hemoglobin: 10 g/dL — ABNORMAL LOW (ref 13.0–17.0)
LYMPHS PCT: 7 %
Lymphs Abs: 0.6 10*3/uL — ABNORMAL LOW (ref 0.7–4.0)
MCH: 29 pg (ref 26.0–34.0)
MCHC: 29.5 g/dL — AB (ref 30.0–36.0)
MCV: 98.3 fL (ref 78.0–100.0)
MONO ABS: 1 10*3/uL (ref 0.1–1.0)
MONOS PCT: 12 %
Neutro Abs: 6.2 10*3/uL (ref 1.7–7.7)
Neutrophils Relative %: 75 %
PLATELETS: 212 10*3/uL (ref 150–400)
RBC: 3.45 MIL/uL — ABNORMAL LOW (ref 4.22–5.81)
RDW: 15.7 % — AB (ref 11.5–15.5)
WBC: 8.4 10*3/uL (ref 4.0–10.5)

## 2017-10-10 LAB — BRAIN NATRIURETIC PEPTIDE: B NATRIURETIC PEPTIDE 5: 22 pg/mL (ref 0.0–100.0)

## 2017-10-10 LAB — TROPONIN I

## 2017-10-10 LAB — LACTIC ACID, PLASMA
Lactic Acid, Venous: 2.5 mmol/L (ref 0.5–1.9)
Lactic Acid, Venous: 0.8 mmol/L (ref 0.5–1.9)

## 2017-10-10 MED ORDER — ASPIRIN EC 81 MG PO TBEC
81.0000 mg | DELAYED_RELEASE_TABLET | Freq: Every day | ORAL | Status: DC
  Administered 2017-10-11 – 2017-10-14 (×4): 81 mg via ORAL
  Filled 2017-10-10 (×4): qty 1

## 2017-10-10 MED ORDER — PANTOPRAZOLE SODIUM 40 MG PO TBEC
40.0000 mg | DELAYED_RELEASE_TABLET | Freq: Two times a day (BID) | ORAL | Status: DC
  Administered 2017-10-10 – 2017-10-14 (×8): 40 mg via ORAL
  Filled 2017-10-10 (×8): qty 1

## 2017-10-10 MED ORDER — SODIUM CHLORIDE 0.9 % IV SOLN
2.0000 g | Freq: Three times a day (TID) | INTRAVENOUS | Status: DC
  Administered 2017-10-10: 2 g via INTRAVENOUS
  Filled 2017-10-10: qty 2

## 2017-10-10 MED ORDER — ALBUTEROL SULFATE HFA 108 (90 BASE) MCG/ACT IN AERS: 2.0000 | INHALATION_SPRAY | RESPIRATORY_TRACT | Status: DC | PRN

## 2017-10-10 MED ORDER — SODIUM CHLORIDE 0.9 % IV SOLN: 2.0000 g | Freq: Three times a day (TID) | INTRAVENOUS | Status: DC

## 2017-10-10 MED ORDER — SODIUM CHLORIDE 0.9 % IV SOLN
INTRAVENOUS | Status: DC
  Administered 2017-10-10 – 2017-10-11 (×2): via INTRAVENOUS

## 2017-10-10 MED ORDER — VANCOMYCIN HCL 10 G IV SOLR
2000.0000 mg | Freq: Once | INTRAVENOUS | Status: AC
  Administered 2017-10-10: 2000 mg via INTRAVENOUS
  Filled 2017-10-10: qty 2000

## 2017-10-10 MED ORDER — GABAPENTIN 400 MG PO CAPS
400.0000 mg | ORAL_CAPSULE | Freq: Three times a day (TID) | ORAL | Status: DC
  Administered 2017-10-10 – 2017-10-14 (×11): 400 mg via ORAL
  Filled 2017-10-10 (×11): qty 1

## 2017-10-10 MED ORDER — ALBUTEROL SULFATE (2.5 MG/3ML) 0.083% IN NEBU
2.5000 mg | INHALATION_SOLUTION | RESPIRATORY_TRACT | Status: DC | PRN
Start: 2017-10-10 — End: 2017-10-14

## 2017-10-10 MED ORDER — SODIUM CHLORIDE 0.9 % IV BOLUS
500.0000 mL | Freq: Once | INTRAVENOUS | Status: AC
  Administered 2017-10-10: 500 mL via INTRAVENOUS

## 2017-10-10 MED ORDER — SODIUM CHLORIDE 0.9 % IV SOLN
1.0000 g | Freq: Three times a day (TID) | INTRAVENOUS | Status: DC
  Administered 2017-10-11 (×2): 1 g via INTRAVENOUS
  Filled 2017-10-10 (×8): qty 1

## 2017-10-10 NOTE — ED Provider Notes (Signed)
[facility] Provider Note   CSN: [id number] Arrival date & time: 10/10/17  1229     History   Chief Complaint Chief Complaint  Patient presents with  . Loss of Consciousness    HPI Anthony [name] is a 77 y.o. male.  The history is provided by the patient, the EMS personnel and the nursing home. The history is limited by the condition of the patient (Hx dementia).  Loss of Consciousness    Pt was seen at 1320.  Per EMS, NH report, and pt: Pt was receiving an albuterol treatment when he had a brief syncopal episode. Pt hit the back of his head against a wall. NH staff states they are concerned pt "may have a UTI." Pt himself only c/o cough and currently denies any other complaints.  Pt with significant hx of dementia.     Past Medical History:  Diagnosis Date  . Acute pyelonephritis 12/23/2014  . Asthma   . BPH (benign prostatic hyperplasia)   . Chronic back pain   . Chronic hip pain   . CKD (chronic kidney disease)   . COPD (chronic obstructive pulmonary disease) ([place])   . Dementia   . Depression   . GERD (gastroesophageal reflux disease)   . Kidney stone   . Neuropathy   . Pulmonary nodules 12/25/2014  . Restless leg syndrome     Patient Active Problem List   Diagnosis Date Noted  . Hematemesis 07/06/2017  . Normochromic normocytic anemia 06/19/2017  . CKD (chronic kidney disease) stage 3, GFR 30-59 ml/min (HCC) 06/19/2017  . Dementia 06/19/2017  . Decubitus ulcer 06/19/2017  . Open wnd of scalp   . S/p left hip fracture   . HCAP (healthcare-associated pneumonia) 10/31/2016  . CKD (chronic kidney disease) 10/31/2016  . COPD (chronic obstructive pulmonary disease) ([place]) 10/31/2016  . Hyponatremia 12/25/2014  . Hypokalemia 12/25/2014  . Normocytic anemia 12/25/2014  . Pulmonary nodules 12/25/2014  . Tobacco abuse 12/25/2014  . ARF (acute renal failure) ([place]) 12/24/2014  . Acute pyelonephritis 12/23/2014    Past Surgical History:    Procedure Laterality Date  . ABDOMINAL SURGERY     heria  . ESOPHAGOGASTRODUODENOSCOPY N/A 07/06/2017   Procedure: ESOPHAGOGASTRODUODENOSCOPY (EGD);  Surgeon: Rogene Houston, MD;  Location: [facility];  Service: Endoscopy;  Laterality: N/A;  . EYE SURGERY          Home Medications    Prior to Admission medications   Medication Sig Start Date End Date Taking? Authorizing Provider  acetaminophen (TYLENOL) 500 MG tablet Take 500 mg every 8 (eight) hours as needed by mouth for mild pain.    [provider]  albuterol (PROVENTIL HFA;VENTOLIN HFA) 108 (90 BASE) MCG/ACT inhaler Inhale 2 puffs into the lungs every 4 (four) hours as needed for wheezing or shortness of breath.    [provider]  albuterol (PROVENTIL) (2.5 MG/3ML) 0.083% nebulizer solution Take 3 mLs (2.5 mg total) by nebulization every 4 (four) hours as needed for wheezing or shortness of breath. 07/07/17   Irwin Brakeman L, MD  aspirin EC 81 MG tablet Take 81 mg by mouth daily.    [provider]  baclofen (LIORESAL) 10 MG tablet Take 10 mg by mouth 2 (two) times daily.    [provider]  buPROPion (WELLBUTRIN SR) 150 MG 12 hr tablet Take 150 mg by mouth 2 (two) times daily.    [provider]  Calcium Citrate-Vitamin D (CALCIUM CITRATE+D3 PETITES) 200-250 MG-UNIT TABS  Take 1 tablet by mouth daily.    [provider]  cephALEXin (KEFLEX) 500 MG capsule Take 1 capsule (500 mg total) by mouth 2 (two) times daily. 09/21/17   Virgel Manifold, MD  cholecalciferol (VITAMIN D) 1000 units tablet Take 2,000 Units by mouth daily.    [provider]  fluticasone (FLONASE) 50 MCG/ACT nasal spray Place 1 spray 2 (two) times daily into both nostrils.     [provider]  Fluticasone-Salmeterol (ADVAIR) 250-50 MCG/DOSE AEPB Inhale 1 puff 2 (two) times daily into the lungs.    [provider]  gabapentin (NEURONTIN) 400 MG capsule Take 400 mg by mouth 3 (three)  times daily.    [provider]  ipratropium (ATROVENT) 0.02 % nebulizer solution Take 2.5 mLs (0.5 mg total) by nebulization every 6 (six) hours as needed for wheezing or shortness of breath. 07/07/17   Johnson, Clanford L, MD  memantine (NAMENDA) 10 MG tablet Take 10 mg by mouth 2 (two) times daily.    [provider]  neomycin-bacitracin-polymyxin (NEOSPORIN) ointment Apply topically 3 (three) times daily. Apply to scalp wound. Patient not taking: Reported on 09/09/2017 06/21/17   Arrien, Jimmy Picket, MD  pantoprazole (PROTONIX) 40 MG tablet Take 1 tablet (40 mg total) by mouth 2 (two) times daily before a meal. 07/07/17   Johnson, Clanford L, MD  pramipexole (MIRAPEX) 0.125 MG tablet Take 0.125 mg by mouth at bedtime.    [provider]  sennosides-docusate sodium (SENOKOT-S) 8.6-50 MG tablet Take 1 tablet by mouth daily as needed for constipation.    [provider]  sertraline (ZOLOFT) 50 MG tablet Take 50 mg by mouth daily.    [provider]  tamsulosin (FLOMAX) 0.4 MG CAPS capsule Take 0.4 mg by mouth daily as needed.     [provider]  traMADol (ULTRAM) 50 MG tablet Take 1 tablet (50 mg total) by mouth every 6 (six) hours as needed for severe pain. 07/07/17   Johnson, Clanford L, MD  traZODone (DESYREL) 100 MG tablet Take 200 mg by mouth at bedtime.    [provider]  triamcinolone ointment (KENALOG) 0.5 % Apply 1 application topically 2 (two) times daily. Apply to affected areas of forearms twice daily.    [provider]  Vitamin D, Ergocalciferol, (DRISDOL) 50000 units CAPS capsule Take 50,000 Units by mouth every 7 (seven) days.    [provider]    Family History Family History  Family history unknown: Yes    Social History Social History   Tobacco Use  . Smoking status: Former Smoker    Packs/day: 0.50    Types: Cigarettes    Last attempt to quit: 06/09/2017    Years since quitting: 0.3  .  Smokeless tobacco: Never Used  Substance Use Topics  . Alcohol use: No  . Drug use: No     Allergies   Bee venom; Penicillins; and Strawberry extract   Review of Systems Review of Systems  Unable to perform ROS: Dementia  Cardiovascular: Positive for syncope.     Physical Exam Updated Vital Signs BP 118/63 (BP Location: Right Arm)   Pulse 69   Temp 98.1 F (36.7 C) (Oral)   Resp 19   Ht 6\' 3"  (1.905 m)   Wt 84.4 kg (186 lb)   SpO2 95%   BMI 23.25 kg/m     Patient Vitals for the past 24 hrs:  BP Temp Temp src Pulse Resp SpO2 Height Weight  10/10/17  1445 - - - 75 16 97 % - -  10/10/17 1330 (!) 117/55 - - 60 14 94 % - -  10/10/17 1315 - - - (!) 59 12 95 % - -  10/10/17 1300 95/63 - - 64 14 96 % - -  10/10/17 1237 118/63 98.1 F (36.7 C) Oral 69 19 95 % - -  10/10/17 1232 - - - - - - 6\' 3"  (1.905 m) 84.4 kg (186 lb)     13:50 Orthostatic Vital Signs TH  Orthostatic Lying   BP- Lying: 117/56   Pulse- Lying: 66       Orthostatic Sitting  BP- Sitting: 106/59   Pulse- Sitting: 82       Orthostatic Standing at 0 minutes  BP- Standing at 0 minutes: 113/57   Pulse- Standing at 0 minutes: 82      Physical Exam 1330: Physical examination:  Nursing notes reviewed; Vital signs and O2 SAT reviewed;  Constitutional: Well developed, Well nourished, In no acute distress; Head:  Normocephalic, +small contusion to posterior scalp.;; Eyes: EOMI, PERRL, No scleral icterus; ENMT: Mouth and pharynx normal, Mucous membranes dry;;; Neck: Supple, Full range of motion, No lymphadenopathy; Cardiovascular: Regular rate and rhythm, No gallop; Respiratory: Breath sounds coarse & equal bilaterally, No  wheezes. +moist cough during exam. Speaking full sentences with ease, Normal respiratory effort/excursion; Chest: Nontender, Movement normal; Abdomen: Soft, Nontender, Nondistended, Normal bowel sounds; Genitourinary: No CVA tenderness; Spine:  No midline CS, TS, LS tenderness.;;  Extremities: Peripheral pulses normal, No tenderness, No edema, No calf edema or asymmetry.; Neuro: Awake/alert, confused per hx dementia. No facial droop. Speech clear. Grips equal. Pt moves all extremities on stretcher spontaneously and to command without apparent gross focal motor deficits.; Skin: Color normal, Warm, Dry.   ED Treatments / Results  Labs (all labs ordered are listed, but only abnormal results are displayed)   EKG EKG Interpretation  Date/Time:  Monday October 10 2017 12:43:18 EDT Ventricular Rate:  70 PR Interval:    QRS Duration: 123 QT Interval:  429 QTC Calculation: 463 R Axis:   -57 Text Interpretation:  Sinus rhythm Atrial premature complex Left axis deviation Left bundle branch block Baseline wander When compared with ECG of 09/21/2017 No significant change was found Confirmed by Francine Graven [phone]) on 10/10/2017 1:30:22 PM   Radiology   Procedures Procedures (including critical care time)  Medications Ordered in ED Medications - No data to display   Initial Impression / Assessment and Plan / ED Course  I have reviewed the triage vital signs and the nursing notes.  Pertinent labs & imaging results that were available during my care of the patient were reviewed by me and considered in my medical decision making (see chart for details).  MDM Reviewed: previous chart, nursing note and vitals Reviewed previous: labs and ECG Interpretation: labs, ECG, x-ray and CT scan   Results for orders placed or performed during the hospital encounter of 10/10/17  Comprehensive metabolic panel  Result Value Ref Range   Sodium 143 135 - 145 mmol/L   Potassium 4.2 3.5 - 5.1 mmol/L   Chloride 111 98 - 111 mmol/L   CO2 23 22 - 32 mmol/L   Glucose, Bld 89 70 - 99 mg/dL   BUN 49 (H) 8 - 23 mg/dL   Creatinine, Ser 3.14 (H) 0.61 - 1.24 mg/dL   Calcium 8.4 (L) 8.9 - 10.3 mg/dL   Total Protein 7.7 6.5 - 8.1 g/dL   Albumin 3.3 (  L) 3.5 - 5.0 g/dL   AST 13 (L) 15 -  41 U/L   ALT 8 0 - 44 U/L   Alkaline Phosphatase 65 38 - 126 U/L   Total Bilirubin 0.5 0.3 - 1.2 mg/dL   GFR calc non Af Amer 17 (L) >60 mL/min   GFR calc Af Amer 20 (L) >60 mL/min   Anion gap 9 5 - 15  Brain natriuretic peptide  Result Value Ref Range   B Natriuretic Peptide 22.0 0.0 - 100.0 pg/mL  Troponin I  Result Value Ref Range   Troponin I <0.03 <0.03 ng/mL  CBC with Differential  Result Value Ref Range   WBC 8.4 4.0 - 10.5 K/uL   RBC 3.45 (L) 4.22 - 5.81 MIL/uL   Hemoglobin 10.0 (L) 13.0 - 17.0 g/dL   HCT 33.9 (L) 39.0 - 52.0 %   MCV 98.3 78.0 - 100.0 fL   MCH 29.0 26.0 - 34.0 pg   MCHC 29.5 (L) 30.0 - 36.0 g/dL   RDW 15.7 (H) 11.5 - 15.5 %   Platelets 212 150 - 400 K/uL   Neutrophils Relative % 75 %   Neutro Abs 6.2 1.7 - 7.7 K/uL   Lymphocytes Relative 7 %   Lymphs Abs 0.6 (L) 0.7 - 4.0 K/uL   Monocytes Relative 12 %   Monocytes Absolute 1.0 0.1 - 1.0 K/uL   Eosinophils Relative 6 %   Eosinophils Absolute 0.5 0.0 - 0.7 K/uL   Basophils Relative 0 %   Basophils Absolute 0.0 0.0 - 0.1 K/uL  Lactic acid, plasma  Result Value Ref Range   Lactic Acid, Venous 2.5 (HH) 0.5 - 1.9 mmol/L  Urinalysis, Routine w reflex microscopic  Result Value Ref Range   Color, Urine YELLOW YELLOW   APPearance HAZY (A) CLEAR   Specific Gravity, Urine 1.008 1.005 - 1.030   pH 5.0 5.0 - 8.0   Glucose, UA NEGATIVE NEGATIVE mg/dL   Hgb urine dipstick SMALL (A) NEGATIVE   Bilirubin Urine NEGATIVE NEGATIVE   Ketones, ur NEGATIVE NEGATIVE mg/dL   Protein, ur NEGATIVE NEGATIVE mg/dL   Nitrite NEGATIVE NEGATIVE   Leukocytes, UA LARGE (A) NEGATIVE   RBC / HPF 0-5 0 - 5 RBC/hpf   WBC, UA >50 (H) 0 - 5 WBC/hpf   Bacteria, UA RARE (A) NONE SEEN   Squamous Epithelial / LPF 0-5 0 - 5   WBC Clumps PRESENT    Dg Chest 2 View Result Date: 10/10/2017 CLINICAL DATA:  Syncope. EXAM: CHEST - 2 VIEW COMPARISON:  Aug 05, 2017 FINDINGS: No pneumothorax. The heart, hila, and mediastinum are normal.  The left lung is clear. Mild opacity in the right mid lung in the right lower lobe. No other abnormalities or changes. IMPRESSION: Mild opacity in the right mid lower lung could represent atelectasis or early infiltrate. Recommend short-term follow-up to ensure resolution. Electronically Signed   By: Dorise Bullion III M.D   On: 10/10/2017 14:14   Ct Head Wo Contrast Result Date: 10/10/2017 CLINICAL DATA:  77 year old male status post syncope, struck back of head on wall. Mild confusion. EXAM: CT HEAD WITHOUT CONTRAST CT CERVICAL SPINE WITHOUT CONTRAST TECHNIQUE: Multidetector CT imaging of the head and cervical spine was performed following the standard protocol without intravenous contrast. Multiplanar CT image reconstructions of the cervical spine were also generated. COMPARISON:  Head CT without contrast 08/05/2017. FINDINGS: CT HEAD FINDINGS Brain: Stable cerebral volume. No midline shift, ventriculomegaly, mass effect, evidence of mass lesion, intracranial  hemorrhage or evidence of cortically based acute infarction. Stable gray-white matter differentiation throughout the brain. No cortical encephalomalacia identified. Vascular: Mild Calcified atherosclerosis at the skull base. Skull: Stable and intact. Sinuses/Orbits: Stable scattered sinus mucosal thickening or retention cysts. Generally well pneumatized sinuses overall. Tympanic cavities and mastoids remain clear. Other: No scalp hematoma or laceration identified. Stable and negative orbits soft tissues. CT CERVICAL SPINE FINDINGS Alignment: Straightening of cervical lordosis. Mild degenerative appearing anterolisthesis at C4-C5 and C7 on T1 with chronic facet degeneration at both levels. Skull base and vertebrae: Visualized skull base is intact. No atlanto-occipital dissociation. No cervical spine fracture identified. Soft tissues and spinal canal: No prevertebral fluid or swelling. No visible canal hematoma. Calcified cervical carotid atherosclerosis.  Disc levels: Widespread cervical facet hypertrophy, moderate to severe on the right at C2-C3, on the left at C3-C4, on the left at C4-C5. Moderate to severe disc space loss with bulky endplate spurring at S4-H6. Subsequent mild to moderate spinal stenosis. Mild if any cervical spinal stenosis suspected elsewhere. Upper chest: The visible upper thoracic levels appear grossly intact. There is biapical lung scarring. Some paraseptal emphysema is visible on right. Negative noncontrast thoracic inlet. IMPRESSION: 1. No acute traumatic injury identified in the head or cervical spine. 2. Stable noncontrast CT appearance of the brain. 3. Advanced cervical spine degeneration with mild to moderate spinal stenosis at C5-C6. Electronically Signed   By: Genevie Ann M.D.   On: 10/10/2017 15:43   Ct Cervical Spine Wo Contrast Result Date: 10/10/2017 CLINICAL DATA:  77 year old male status post syncope, struck back of head on wall. Mild confusion. EXAM: CT HEAD WITHOUT CONTRAST CT CERVICAL SPINE WITHOUT CONTRAST TECHNIQUE: Multidetector CT imaging of the head and cervical spine was performed following the standard protocol without intravenous contrast. Multiplanar CT image reconstructions of the cervical spine were also generated. COMPARISON:  Head CT without contrast 08/05/2017. FINDINGS: CT HEAD FINDINGS Brain: Stable cerebral volume. No midline shift, ventriculomegaly, mass effect, evidence of mass lesion, intracranial hemorrhage or evidence of cortically based acute infarction. Stable gray-white matter differentiation throughout the brain. No cortical encephalomalacia identified. Vascular: Mild Calcified atherosclerosis at the skull base. Skull: Stable and intact. Sinuses/Orbits: Stable scattered sinus mucosal thickening or retention cysts. Generally well pneumatized sinuses overall. Tympanic cavities and mastoids remain clear. Other: No scalp hematoma or laceration identified. Stable and negative orbits soft tissues. CT  CERVICAL SPINE FINDINGS Alignment: Straightening of cervical lordosis. Mild degenerative appearing anterolisthesis at C4-C5 and C7 on T1 with chronic facet degeneration at both levels. Skull base and vertebrae: Visualized skull base is intact. No atlanto-occipital dissociation. No cervical spine fracture identified. Soft tissues and spinal canal: No prevertebral fluid or swelling. No visible canal hematoma. Calcified cervical carotid atherosclerosis. Disc levels: Widespread cervical facet hypertrophy, moderate to severe on the right at C2-C3, on the left at C3-C4, on the left at C4-C5. Moderate to severe disc space loss with bulky endplate spurring at P5-F1. Subsequent mild to moderate spinal stenosis. Mild if any cervical spinal stenosis suspected elsewhere. Upper chest: The visible upper thoracic levels appear grossly intact. There is biapical lung scarring. Some paraseptal emphysema is visible on right. Negative noncontrast thoracic inlet. IMPRESSION: 1. No acute traumatic injury identified in the head or cervical spine. 2. Stable noncontrast CT appearance of the brain. 3. Advanced cervical spine degeneration with mild to moderate spinal stenosis at C5-C6. Electronically Signed   By: Genevie Ann M.D.   On: 10/10/2017 15:43    Results for AXELL, TRIGUEROS (MRN  [id number]) as of 10/10/2017 16:00  Ref. Range 06/19/2017 06:07 06/21/2017 05:03 07/06/2017 00:49 09/21/2017 11:14 10/10/2017 14:16  BUN Latest Ref Range: 8 - 23 mg/dL 36 (H) 35 (H) 41 (H) 47 (H) 49 (H)  Creatinine Latest Ref Range: 0.61 - 1.24 mg/dL 1.70 (H) 1.70 (H) 1.43 (H) 2.71 (H) 3.14 (H)     1610:  Pt with labile BP and new AKI; judicious IVF bolus given with improvement of BP. IV abx started for HCAP, given cough during exam, mildly elevated lactic acid and labile BP. Possible UTI on Udip also, UC is pending. T/C returned from [facility]. Shanon Brow, case discussed, including:  HPI, pertinent PM/SHx, VS/PE, dx testing, ED course and treatment:  Agreeable to admit.      Final Clinical Impressions(s) / ED Diagnoses   Final diagnoses:  None    ED Discharge Orders    None       Francine Graven, DO 10/12/17 2126

## 2017-10-10 NOTE — Progress Notes (Addendum)
Pharmacy Antibiotic Note  Anthony [name] is a 77 y.o. male admitted on 10/10/2017 with pneumonia.  Pharmacy has been consulted for vancomycin dosing.  Plan:  Loading dose:  Vancomycin 2g IV x1 dose at 1700 Maintenance dose:  Vancomycin 1g IV q48h (plan is for a total of 8 days of tx)  Goal trough range: 15-20   mcg/mL Pharmacy will continue to monitor renal function, vancomycin troughs, cultures and patient progress.   Height: 6\' 3"  (190.5 cm) Weight: 186 lb (84.4 kg) IBW/kg (Calculated) : 84.5  Temp (24hrs), Avg:98.1 F (36.7 C), Min:98.1 F (36.7 C), Max:98.1 F (36.7 C)  Recent Labs  Lab 10/10/17 1416  WBC 8.4  CREATININE 3.14*  LATICACIDVEN 2.5*    Estimated Creatinine Clearance: 22.8 mL/min (A) (by C-G formula based on SCr of 3.14 mg/dL (H)).    Antimicrobials this admission: 7/29 aztreonam  >>  7/29 vancomycin >>  Dose adjustments this admission: n/a  Microbiology results: 7/29 UCx: in progress   Thank you for allowing pharmacy to be a part of this patient's care.  Despina Pole, Pharm. D. Clinical Pharmacist 10/10/2017 4:15 PM

## 2017-10-10 NOTE — ED Notes (Signed)
Blood cultures ordered after first dose of antibiotics given.

## 2017-10-10 NOTE — Progress Notes (Signed)
PHARMACY NOTE:  ANTIMICROBIAL RENAL DOSAGE ADJUSTMENT  Current antimicrobial regimen includes a mismatch between antimicrobial dosage and estimated renal function.  As per policy approved by the Pharmacy & Therapeutics and Medical Executive Committees, the antimicrobial dosage will be adjusted accordingly.  Current antimicrobial dosage: Aztreonam 2g IV q8h  Indication: HCAP  Renal Function:  Estimated Creatinine Clearance: 22.8 mL/min (A) (by C-G formula based on SCr of 3.14 mg/dL (H)). []      On intermittent HD, scheduled: []      On CRRT    Antimicrobial dosage has been changed to: Aztreonam 1g IV q8h   Thank you for allowing pharmacy to be a part of this patient's care.  Despina Pole, Pharm. D. Clinical Pharmacist 10/10/2017 4:52 PM

## 2017-10-10 NOTE — ED Triage Notes (Addendum)
Per EMS, pt from [facility]. Pt was receiving an albuterol treatment when he had a syncopal episode. Pt hit back of head on wall. No signs of injury. Pt AO. CBG 196. Staff at facility concerned pt may have UTI. Mild confusion due to hx of dementia.

## 2017-10-10 NOTE — ED Notes (Signed)
CRITICAL VALUE ALERT  Critical Value:  Lactic 2.5  Date & Time Notied:  10/10/17 1523  Provider Notified: Yes  Orders Received/Actions taken: No actions yet

## 2017-10-10 NOTE — H&P (Signed)
History and Physical    Anthony [name] VPX:[id number] DOB: [birth year] DOA: 10/10/2017  PCP: Rosita Fire, MD  Patient coming from: snf  Chief Complaint:  syncope  HPI: Anthony [name] is a 77 y.o. male with medical history significant of dementia, copd, sent in from snf for a syncopal episode and possible uti.  Pt says he has been coughing a lot.  Does not know of any n/v/d.  Denies any fevers or sob.  His history is unreliable due to his dementia.  Pt found to have pna on cxr.  And possible uti.  Paperwork from SNF is very limited and minimal and has no code status.  Review of Systems: unobtainable from pt  Past Medical History:  Diagnosis Date  . Acute pyelonephritis 12/23/2014  . Asthma   . BPH (benign prostatic hyperplasia)   . Chronic back pain   . Chronic hip pain   . CKD (chronic kidney disease)   . COPD (chronic obstructive pulmonary disease) ([place])   . Dementia   . Depression   . GERD (gastroesophageal reflux disease)   . Kidney stone   . Neuropathy   . Pulmonary nodules 12/25/2014  . Restless leg syndrome     Past Surgical History:  Procedure Laterality Date  . ABDOMINAL SURGERY     heria  . ESOPHAGOGASTRODUODENOSCOPY N/A 07/06/2017   Procedure: ESOPHAGOGASTRODUODENOSCOPY (EGD);  Surgeon: Rogene Houston, MD;  Location: [facility];  Service: Endoscopy;  Laterality: N/A;  . EYE SURGERY       reports that he quit smoking about 4 months ago. His smoking use included cigarettes. He smoked 0.50 packs per day. He has never used smokeless tobacco. He reports that he does not drink alcohol or use drugs.  All pcn  Family History  Family history unknown: Yes    Prior to Admission medications   Medication Sig Start Date End Date Taking? Authorizing Provider  acetaminophen (TYLENOL) 500 MG tablet Take 500 mg every 8 (eight) hours as needed by mouth for mild pain.    [provider]  albuterol (PROVENTIL HFA;VENTOLIN HFA) 108 (90 BASE) MCG/ACT inhaler Inhale 2  puffs into the lungs every 4 (four) hours as needed for wheezing or shortness of breath.    [provider]  albuterol (PROVENTIL) (2.5 MG/3ML) 0.083% nebulizer solution Take 3 mLs (2.5 mg total) by nebulization every 4 (four) hours as needed for wheezing or shortness of breath. 07/07/17   Irwin Brakeman L, MD  aspirin EC 81 MG tablet Take 81 mg by mouth daily.    [provider]  baclofen (LIORESAL) 10 MG tablet Take 10 mg by mouth 2 (two) times daily.    [provider]  buPROPion (WELLBUTRIN SR) 150 MG 12 hr tablet Take 150 mg by mouth 2 (two) times daily.    [provider]  Calcium Citrate-Vitamin D (CALCIUM CITRATE+D3 PETITES) 200-250 MG-UNIT TABS Take 1 tablet by mouth daily.    [provider]  cephALEXin (KEFLEX) 500 MG capsule Take 1 capsule (500 mg total) by mouth 2 (two) times daily. 09/21/17   Virgel Manifold, MD  cholecalciferol (VITAMIN D) 1000 units tablet Take 2,000 Units by mouth daily.    [provider]  fluticasone (FLONASE) 50 MCG/ACT nasal spray Place 1 spray 2 (two) times daily into both nostrils.     [provider]  Fluticasone-Salmeterol (ADVAIR) 250-50 MCG/DOSE AEPB Inhale 1 puff 2 (two) times daily into the lungs.    [provider]  gabapentin (NEURONTIN)  400 MG capsule Take 400 mg by mouth 3 (three) times daily.    [provider]  ipratropium (ATROVENT) 0.02 % nebulizer solution Take 2.5 mLs (0.5 mg total) by nebulization every 6 (six) hours as needed for wheezing or shortness of breath. 07/07/17   Johnson, Clanford L, MD  memantine (NAMENDA) 10 MG tablet Take 10 mg by mouth 2 (two) times daily.    [provider]  neomycin-bacitracin-polymyxin (NEOSPORIN) ointment Apply topically 3 (three) times daily. Apply to scalp wound. Patient not taking: Reported on 09/09/2017 06/21/17   Arrien, Jimmy Picket, MD  pantoprazole (PROTONIX) 40 MG tablet Take 1 tablet (40 mg total) by mouth 2  (two) times daily before a meal. 07/07/17   Johnson, Clanford L, MD  pramipexole (MIRAPEX) 0.125 MG tablet Take 0.125 mg by mouth at bedtime.    [provider]  sennosides-docusate sodium (SENOKOT-S) 8.6-50 MG tablet Take 1 tablet by mouth daily as needed for constipation.    [provider]  sertraline (ZOLOFT) 50 MG tablet Take 50 mg by mouth daily.    [provider]  tamsulosin (FLOMAX) 0.4 MG CAPS capsule Take 0.4 mg by mouth daily as needed.     [provider]  traMADol (ULTRAM) 50 MG tablet Take 1 tablet (50 mg total) by mouth every 6 (six) hours as needed for severe pain. 07/07/17   Johnson, Clanford L, MD  traZODone (DESYREL) 100 MG tablet Take 200 mg by mouth at bedtime.    [provider]  triamcinolone ointment (KENALOG) 0.5 % Apply 1 application topically 2 (two) times daily. Apply to affected areas of forearms twice daily.    [provider]  Vitamin D, Ergocalciferol, (DRISDOL) 50000 units CAPS capsule Take 50,000 Units by mouth every 7 (seven) days.    [provider]    Physical Exam: Vitals:   10/10/17 1445 10/10/17 1500 10/10/17 1530 10/10/17 1600  BP:  133/72 138/64 120/77  Pulse: 75 71 98 63  Resp: 16 (!) 21 20 (!) 26  Temp:      TempSrc:      SpO2: 97% 98% (!) 89% 98%  Weight:      Height:         Constitutional: NAD, calm, comfortable, pleasantly confused Vitals:   10/10/17 1445 10/10/17 1500 10/10/17 1530 10/10/17 1600  BP:  133/72 138/64 120/77  Pulse: 75 71 98 63  Resp: 16 (!) 21 20 (!) 26  Temp:      TempSrc:      SpO2: 97% 98% (!) 89% 98%  Weight:      Height:       Eyes: PERRL, lids and conjunctivae normal ENMT: Mucous membranes are moist. Posterior pharynx clear of any exudate or lesions.Normal dentition.  Neck: normal, supple, no masses, no thyromegaly Respiratory: clear to auscultation bilaterally, no wheezing, no crackles. Normal respiratory effort. No accessory muscle use.    Cardiovascular: Regular rate and rhythm, no murmurs / rubs / gallops. No extremity edema. 2+ pedal pulses. No carotid bruits.  Abdomen: no tenderness, no masses palpated. No hepatosplenomegaly. Bowel sounds positive.  Musculoskeletal: no clubbing / cyanosis. No joint deformity upper and lower extremities. Good ROM, no contractures. Normal muscle tone.  Skin: no rashes, lesions, ulcers. No induration Neurologic: CN 2-12 grossly intact. Sensation intact, DTR normal. Strength 5/5 in all 4.  Psychiatric: not Normal judgment and insight. Alert and oriented x 1 to name only. Normal mood.    Labs on Admission: I have personally  reviewed following labs and imaging studies  CBC: Recent Labs  Lab 10/10/17 1416  WBC 8.4  NEUTROABS 6.2  HGB 10.0*  HCT 33.9*  MCV 98.3  PLT 638   Basic Metabolic Panel: Recent Labs  Lab 10/10/17 1416  NA 143  K 4.2  CL 111  CO2 23  GLUCOSE 89  BUN 49*  CREATININE 3.14*  CALCIUM 8.4*   GFR: Estimated Creatinine Clearance: 22.8 mL/min (A) (by C-G formula based on SCr of 3.14 mg/dL (H)). Liver Function Tests: Recent Labs  Lab 10/10/17 1416  AST 13*  ALT 8  ALKPHOS 65  BILITOT 0.5  PROT 7.7  ALBUMIN 3.3*   No results for input(s): LIPASE, AMYLASE in the last 168 hours. No results for input(s): AMMONIA in the last 168 hours. Coagulation Profile: No results for input(s): INR, PROTIME in the last 168 hours. Cardiac Enzymes: Recent Labs  Lab 10/10/17 1416  TROPONINI <0.03   BNP (last 3 results) No results for input(s): PROBNP in the last 8760 hours. HbA1C: No results for input(s): HGBA1C in the last 72 hours. CBG: No results for input(s): GLUCAP in the last 168 hours. Lipid Profile: No results for input(s): CHOL, HDL, LDLCALC, TRIG, CHOLHDL, LDLDIRECT in the last 72 hours. Thyroid Function Tests: No results for input(s): TSH, T4TOTAL, FREET4, T3FREE, THYROIDAB in the last 72 hours. Anemia Panel: No results for input(s): VITAMINB12,  FOLATE, FERRITIN, TIBC, IRON, RETICCTPCT in the last 72 hours. Urine analysis:    Component Value Date/Time   COLORURINE YELLOW 10/10/2017 1442   APPEARANCEUR HAZY (A) 10/10/2017 1442   LABSPEC 1.008 10/10/2017 1442   PHURINE 5.0 10/10/2017 1442   GLUCOSEU NEGATIVE 10/10/2017 1442   HGBUR SMALL (A) 10/10/2017 1442   BILIRUBINUR NEGATIVE 10/10/2017 1442   KETONESUR NEGATIVE 10/10/2017 1442   PROTEINUR NEGATIVE 10/10/2017 1442   UROBILINOGEN 1.0 12/23/2014 1853   NITRITE NEGATIVE 10/10/2017 1442   LEUKOCYTESUR LARGE (A) 10/10/2017 1442   Sepsis Labs: !!!!!!!!!!!!!!!!!!!!!!!!!!!!!!!!!!!!!!!!!!!! @LABRCNTIP (procalcitonin:4,lacticidven:4) )No results found for this or any previous visit (from the past 240 hour(s)).   Radiological Exams on Admission: Dg Chest 2 View  Result Date: 10/10/2017 CLINICAL DATA:  Syncope. EXAM: CHEST - 2 VIEW COMPARISON:  Aug 05, 2017 FINDINGS: No pneumothorax. The heart, hila, and mediastinum are normal. The left lung is clear. Mild opacity in the right mid lung in the right lower lobe. No other abnormalities or changes. IMPRESSION: Mild opacity in the right mid lower lung could represent atelectasis or early infiltrate. Recommend short-term follow-up to ensure resolution. Electronically Signed   By: Dorise Bullion III M.D   On: 10/10/2017 14:14   Ct Head Wo Contrast  Result Date: 10/10/2017 CLINICAL DATA:  77 year old male status post syncope, struck back of head on wall. Mild confusion. EXAM: CT HEAD WITHOUT CONTRAST CT CERVICAL SPINE WITHOUT CONTRAST TECHNIQUE: Multidetector CT imaging of the head and cervical spine was performed following the standard protocol without intravenous contrast. Multiplanar CT image reconstructions of the cervical spine were also generated. COMPARISON:  Head CT without contrast 08/05/2017. FINDINGS: CT HEAD FINDINGS Brain: Stable cerebral volume. No midline shift, ventriculomegaly, mass effect, evidence of mass lesion, intracranial  hemorrhage or evidence of cortically based acute infarction. Stable gray-white matter differentiation throughout the brain. No cortical encephalomalacia identified. Vascular: Mild Calcified atherosclerosis at the skull base. Skull: Stable and intact. Sinuses/Orbits: Stable scattered sinus mucosal thickening or retention cysts. Generally well pneumatized sinuses overall. Tympanic cavities and mastoids remain clear. Other: No scalp hematoma or laceration identified.  Stable and negative orbits soft tissues. CT CERVICAL SPINE FINDINGS Alignment: Straightening of cervical lordosis. Mild degenerative appearing anterolisthesis at C4-C5 and C7 on T1 with chronic facet degeneration at both levels. Skull base and vertebrae: Visualized skull base is intact. No atlanto-occipital dissociation. No cervical spine fracture identified. Soft tissues and spinal canal: No prevertebral fluid or swelling. No visible canal hematoma. Calcified cervical carotid atherosclerosis. Disc levels: Widespread cervical facet hypertrophy, moderate to severe on the right at C2-C3, on the left at C3-C4, on the left at C4-C5. Moderate to severe disc space loss with bulky endplate spurring at Z6-X0. Subsequent mild to moderate spinal stenosis. Mild if any cervical spinal stenosis suspected elsewhere. Upper chest: The visible upper thoracic levels appear grossly intact. There is biapical lung scarring. Some paraseptal emphysema is visible on right. Negative noncontrast thoracic inlet. IMPRESSION: 1. No acute traumatic injury identified in the head or cervical spine. 2. Stable noncontrast CT appearance of the brain. 3. Advanced cervical spine degeneration with mild to moderate spinal stenosis at C5-C6. Electronically Signed   By: Genevie Ann M.D.   On: 10/10/2017 15:43   Ct Cervical Spine Wo Contrast  Result Date: 10/10/2017 CLINICAL DATA:  78 year old male status post syncope, struck back of head on wall. Mild confusion. EXAM: CT HEAD WITHOUT CONTRAST CT  CERVICAL SPINE WITHOUT CONTRAST TECHNIQUE: Multidetector CT imaging of the head and cervical spine was performed following the standard protocol without intravenous contrast. Multiplanar CT image reconstructions of the cervical spine were also generated. COMPARISON:  Head CT without contrast 08/05/2017. FINDINGS: CT HEAD FINDINGS Brain: Stable cerebral volume. No midline shift, ventriculomegaly, mass effect, evidence of mass lesion, intracranial hemorrhage or evidence of cortically based acute infarction. Stable gray-white matter differentiation throughout the brain. No cortical encephalomalacia identified. Vascular: Mild Calcified atherosclerosis at the skull base. Skull: Stable and intact. Sinuses/Orbits: Stable scattered sinus mucosal thickening or retention cysts. Generally well pneumatized sinuses overall. Tympanic cavities and mastoids remain clear. Other: No scalp hematoma or laceration identified. Stable and negative orbits soft tissues. CT CERVICAL SPINE FINDINGS Alignment: Straightening of cervical lordosis. Mild degenerative appearing anterolisthesis at C4-C5 and C7 on T1 with chronic facet degeneration at both levels. Skull base and vertebrae: Visualized skull base is intact. No atlanto-occipital dissociation. No cervical spine fracture identified. Soft tissues and spinal canal: No prevertebral fluid or swelling. No visible canal hematoma. Calcified cervical carotid atherosclerosis. Disc levels: Widespread cervical facet hypertrophy, moderate to severe on the right at C2-C3, on the left at C3-C4, on the left at C4-C5. Moderate to severe disc space loss with bulky endplate spurring at R6-E4. Subsequent mild to moderate spinal stenosis. Mild if any cervical spinal stenosis suspected elsewhere. Upper chest: The visible upper thoracic levels appear grossly intact. There is biapical lung scarring. Some paraseptal emphysema is visible on right. Negative noncontrast thoracic inlet. IMPRESSION: 1. No acute  traumatic injury identified in the head or cervical spine. 2. Stable noncontrast CT appearance of the brain. 3. Advanced cervical spine degeneration with mild to moderate spinal stenosis at C5-C6. Electronically Signed   By: Genevie Ann M.D.   On: 10/10/2017 15:43    EKG: Independently reviewed. nsr lbbb old Old chart reviewed   Assessment/Plan 77 yo male dementia sent in from SNF for syncopal episode found to have pneumonia and UTI Principal Problem:   HCAP (healthcare-associated pneumonia)-cover with vancomycin and Azactam as penicillin allergic.  Obtain blood and sputum cultures.  Lactic acid level mildly elevated and vital signs stable.  IV fluids  overnight and serial lactic acid.  Active Problems:   ARF (acute renal failure) (HCC)-bump in his creatinine up to 3.14 last being around 2.7.  IV fluids overnight.  Repeat in the morning.  So has possible UTI.    Syncope-no focal neurological deficits.  Obtain cardiac echo as there is none in the system for years.  Frequent neurological checks    Acute lower UTI placed on IV vancomycin and Azactam as above.  Follow-up on urine culture.-    COPD (chronic obstructive pulmonary disease) (HCC)-stable continue home nebs    CKD (chronic kidney disease) stage 3, GFR 30-59 ml/min (HCC)- as above    Dementia-stable     DVT prophylaxis: scds Code Status: presumptive full code (no code status on paperwork from snf) Family Communication: ward of the state Disposition Plan:  days Consults called:  none Admission status: admission   Emilyrose Darrah A MD [facility]  If 7PM-7AM, please contact night-coverage [email or web address] Password [facility]  10/10/2017, 4:29 PM

## 2017-10-11 ENCOUNTER — Encounter (HOSPITAL_COMMUNITY): Payer: Self-pay

## 2017-10-11 ENCOUNTER — Inpatient Hospital Stay (HOSPITAL_COMMUNITY): Payer: Medicare Other

## 2017-10-11 ENCOUNTER — Other Ambulatory Visit: Payer: Self-pay

## 2017-10-11 DIAGNOSIS — N183 Chronic kidney disease, stage 3 (moderate): Secondary | ICD-10-CM

## 2017-10-11 DIAGNOSIS — N390 Urinary tract infection, site not specified: Secondary | ICD-10-CM

## 2017-10-11 DIAGNOSIS — R55 Syncope and collapse: Secondary | ICD-10-CM

## 2017-10-11 DIAGNOSIS — F0390 Unspecified dementia without behavioral disturbance: Secondary | ICD-10-CM

## 2017-10-11 DIAGNOSIS — J189 Pneumonia, unspecified organism: Principal | ICD-10-CM

## 2017-10-11 DIAGNOSIS — J410 Simple chronic bronchitis: Secondary | ICD-10-CM

## 2017-10-11 DIAGNOSIS — N179 Acute kidney failure, unspecified: Secondary | ICD-10-CM

## 2017-10-11 LAB — BASIC METABOLIC PANEL
Anion gap: 6 (ref 5–15)
BUN: 43 mg/dL — AB (ref 8–23)
CO2: 23 mmol/L (ref 22–32)
CREATININE: 2.96 mg/dL — AB (ref 0.61–1.24)
Calcium: 8.1 mg/dL — ABNORMAL LOW (ref 8.9–10.3)
Chloride: 120 mmol/L — ABNORMAL HIGH (ref 98–111)
GFR calc non Af Amer: 19 mL/min — ABNORMAL LOW (ref >60)
GFR, EST AFRICAN AMERICAN: 22 mL/min — AB (ref >60)
Glucose, Bld: 88 mg/dL (ref 70–99)
POTASSIUM: 4.4 mmol/L (ref 3.5–5.1)
Sodium: 149 mmol/L — ABNORMAL HIGH (ref 135–145)

## 2017-10-11 LAB — ECHOCARDIOGRAM COMPLETE
Height: 76 in
Weight: 3065.28 oz

## 2017-10-11 LAB — CBC WITH DIFFERENTIAL/PLATELET
BASOS ABS: 0 10*3/uL (ref 0.0–0.1)
BASOS PCT: 1 %
Eosinophils Absolute: 0.4 10*3/uL (ref 0.0–0.7)
Eosinophils Relative: 7 %
HEMATOCRIT: 30.9 % — AB (ref 39.0–52.0)
HEMOGLOBIN: 9.3 g/dL — AB (ref 13.0–17.0)
LYMPHS PCT: 8 %
Lymphs Abs: 0.5 10*3/uL — ABNORMAL LOW (ref 0.7–4.0)
MCH: 29.5 pg (ref 26.0–34.0)
MCHC: 30.1 g/dL (ref 30.0–36.0)
MCV: 98.1 fL (ref 78.0–100.0)
Monocytes Absolute: 0.8 10*3/uL (ref 0.1–1.0)
Monocytes Relative: 13 %
Neutro Abs: 4.4 10*3/uL (ref 1.7–7.7)
Neutrophils Relative %: 71 %
Platelets: 189 10*3/uL (ref 150–400)
RBC: 3.15 MIL/uL — AB (ref 4.22–5.81)
RDW: 15.6 % — ABNORMAL HIGH (ref 11.5–15.5)
WBC: 6.1 10*3/uL (ref 4.0–10.5)

## 2017-10-11 LAB — MRSA PCR SCREENING: MRSA BY PCR: NEGATIVE

## 2017-10-11 LAB — STREP PNEUMONIAE URINARY ANTIGEN: STREP PNEUMO URINARY ANTIGEN: NEGATIVE

## 2017-10-11 MED ORDER — IPRATROPIUM-ALBUTEROL 0.5-2.5 (3) MG/3ML IN SOLN
3.0000 mL | Freq: Four times a day (QID) | RESPIRATORY_TRACT | Status: DC
  Administered 2017-10-11: 3 mL via RESPIRATORY_TRACT
  Filled 2017-10-11: qty 3

## 2017-10-11 MED ORDER — PRAMIPEXOLE DIHYDROCHLORIDE 0.25 MG PO TABS
0.1250 mg | ORAL_TABLET | Freq: Every day | ORAL | Status: DC
  Administered 2017-10-11 – 2017-10-13 (×3): 0.125 mg via ORAL
  Filled 2017-10-11 (×6): qty 1

## 2017-10-11 MED ORDER — BACLOFEN 10 MG PO TABS: 10.0000 mg | ORAL_TABLET | Freq: Two times a day (BID) | ORAL | Status: DC

## 2017-10-11 MED ORDER — TRAZODONE HCL 50 MG PO TABS
200.0000 mg | ORAL_TABLET | Freq: Every day | ORAL | Status: DC
  Administered 2017-10-11 – 2017-10-13 (×3): 200 mg via ORAL
  Filled 2017-10-11 (×3): qty 4

## 2017-10-11 MED ORDER — IPRATROPIUM-ALBUTEROL 0.5-2.5 (3) MG/3ML IN SOLN
3.0000 mL | Freq: Three times a day (TID) | RESPIRATORY_TRACT | Status: DC
  Administered 2017-10-12 – 2017-10-14 (×7): 3 mL via RESPIRATORY_TRACT
  Filled 2017-10-11 (×7): qty 3

## 2017-10-11 MED ORDER — VANCOMYCIN HCL IN DEXTROSE 1-5 GM/200ML-% IV SOLN
1000.0000 mg | INTRAVENOUS | Status: DC
  Filled 2017-10-11: qty 200

## 2017-10-11 MED ORDER — ALBUTEROL SULFATE (2.5 MG/3ML) 0.083% IN NEBU: 2.5000 mg | INHALATION_SOLUTION | Freq: Four times a day (QID) | RESPIRATORY_TRACT | Status: DC

## 2017-10-11 MED ORDER — TAMSULOSIN HCL 0.4 MG PO CAPS
0.4000 mg | ORAL_CAPSULE | Freq: Every day | ORAL | Status: DC
  Administered 2017-10-11 – 2017-10-13 (×3): 0.4 mg via ORAL
  Filled 2017-10-11 (×3): qty 1

## 2017-10-11 MED ORDER — LEVOFLOXACIN IN D5W 750 MG/150ML IV SOLN
750.0000 mg | INTRAVENOUS | Status: DC
  Administered 2017-10-11: 750 mg via INTRAVENOUS
  Filled 2017-10-11: qty 150

## 2017-10-11 MED ORDER — HEPARIN SODIUM (PORCINE) 5000 UNIT/ML IJ SOLN
5000.0000 [IU] | Freq: Three times a day (TID) | INTRAMUSCULAR | Status: DC
  Administered 2017-10-11 – 2017-10-14 (×8): 5000 [IU] via SUBCUTANEOUS
  Filled 2017-10-11 (×8): qty 1

## 2017-10-11 MED ORDER — MOMETASONE FURO-FORMOTEROL FUM 200-5 MCG/ACT IN AERO
2.0000 | INHALATION_SPRAY | Freq: Two times a day (BID) | RESPIRATORY_TRACT | Status: DC
  Administered 2017-10-11 – 2017-10-14 (×6): 2 via RESPIRATORY_TRACT
  Filled 2017-10-11 (×2): qty 8.8

## 2017-10-11 MED ORDER — SERTRALINE HCL 50 MG PO TABS
50.0000 mg | ORAL_TABLET | Freq: Every day | ORAL | Status: DC
  Administered 2017-10-12 – 2017-10-14 (×3): 50 mg via ORAL
  Filled 2017-10-11 (×3): qty 1

## 2017-10-11 MED ORDER — IPRATROPIUM BROMIDE 0.02 % IN SOLN: 0.5000 mg | Freq: Four times a day (QID) | RESPIRATORY_TRACT | Status: DC

## 2017-10-11 MED ORDER — BUPROPION HCL ER (SR) 150 MG PO TB12
150.0000 mg | ORAL_TABLET | Freq: Two times a day (BID) | ORAL | Status: DC
  Administered 2017-10-11 – 2017-10-14 (×6): 150 mg via ORAL
  Filled 2017-10-11 (×6): qty 1

## 2017-10-11 MED ORDER — MEMANTINE HCL 10 MG PO TABS
10.0000 mg | ORAL_TABLET | Freq: Two times a day (BID) | ORAL | Status: DC
  Administered 2017-10-11 – 2017-10-14 (×6): 10 mg via ORAL
  Filled 2017-10-11 (×6): qty 1

## 2017-10-11 MED ORDER — DEXTROSE 5 % IV SOLN
INTRAVENOUS | Status: DC
  Administered 2017-10-11 – 2017-10-14 (×7): via INTRAVENOUS

## 2017-10-11 NOTE — Clinical Social Work Note (Signed)
Clinical Social Work Assessment  Patient Details  Name: Anthony [name] MRN: [id number] Date of Birth: 09-22-1940  Date of referral:  10/11/17               Reason for consult:  Other (Comment Required)(From [facility])                Permission sought to share information with:    Permission granted to share information::     Name::        Agency::  Anthony [name] at [facility]  Relationship::     Contact Information:     Housing/Transportation Living arrangements for the past 2 months:  Editor, commissioning, [place] of Information:  Facility Patient Interpreter Needed:  None Criminal Activity/Legal Involvement Pertinent to Current Situation/Hospitalization:  No - Comment as needed Significant Relationships:  Other(Comment)(DSS Guardian) Lives with:  Facility Resident Do you feel safe going back to the place where you live?  Yes Need for family participation in patient care:  No (Coment)  Care giving concerns:  None identified. Facility resident.    Social Worker assessment / plan:  Patient has been a resident at [facility] since 10/20/16. His guardian is Anthony [name] with [facility] (RN left message regarding hospitalization). Patient uses a wheelchair and can help with transfers. Patient does not walk by himself.  He can return to the facility at discharge.    Employment status:  Retired Forensic scientist:  Medicare PT Recommendations:  Not assessed at this time Information / Referral to community resources:     Patient/Family's Response to care:  Message left for legal guardian.   Patient/Family's Understanding of and Emotional Response to Diagnosis, Current Treatment, and Prognosis:  RN left message for legal guardian.   Emotional Assessment Appearance:  Appears stated age Attitude/Demeanor/Rapport:    Affect (typically observed):  Unable to Assess Orientation:  Oriented to Self, Oriented to Place Alcohol / Substance use:  Not Applicable Psych  involvement (Current and /or in the community):  No (Comment)  Discharge Needs  Concerns to be addressed:  Other (Comment Required(return to facility ) Readmission within the last 30 days:  No Current discharge risk:  None Barriers to Discharge:  No Barriers Identified   Anthony Gully, LCSW 10/11/2017, 10:38 AM

## 2017-10-11 NOTE — Progress Notes (Signed)
Echocardiogram 2D Echocardiogram has been performed.  Anthony [name] 10/11/2017, 2:55 PM

## 2017-10-11 NOTE — Progress Notes (Signed)
Anthony [name], Education officer, museum and legal guardian contacted. After hours, so message left as this is not an emergency.   [phone] ext:[phone]

## 2017-10-11 NOTE — Progress Notes (Signed)
Pati Gallo, Education officer, museum and legal guardian contacted.  Message left as this is not an emergency.   [phone] ext:[phone]

## 2017-10-11 NOTE — Progress Notes (Signed)
PROGRESS NOTE    Anthony [name]  ZHG:[id number] DOB: [birth year] DOA: 10/10/2017 PCP: Rosita Fire, MD    Brief Narrative:  77 year old male with history of dementia, COPD who is a resident of a nursing facility, was brought to the hospital with syncopal episode and possible UTI.  He also reports that he has been coughing.  Chest x-ray shows pneumonia and urinalysis indicates possible UTI.  Admitted for intravenous antibiotics and IV fluids.   Assessment & Plan:   Principal Problem:   HCAP (healthcare-associated pneumonia) Active Problems:   ARF (acute renal failure) (HCC)   COPD (chronic obstructive pulmonary disease) (HCC)   CKD (chronic kidney disease) stage 3, GFR 30-59 ml/min (HCC)   Dementia   Syncope and collapse   Acute lower UTI   1. Healthcare associated pneumonia.  Initially placed on vancomycin and Azactam.  Clinically appears to be improving.  Will change antibiotics to levofloxacin. 2. Acute renal failure.  Etiology is unclear, but over the past month creatinine has been trending up.  Continue IV fluids.  Mildly improved overnight after receiving hydration.  Check renal ultrasound. 3. Syncope.  Suspect is related to hypovolemia.  Echocardiogram done does not show any acute findings.  He is not having any further symptoms. 4. Urinary tract infection.  Follow-up urine culture.  Continue levofloxacin. 5. COPD.  No evidence of shortness of breath or wheezing at this time.  Continue bronchodilators. 6. Chronic kidney disease stage III.  Creatinine has trended up from baseline in the past month.  Hold nephrotoxic agents.  Continue to monitor. 7. Dementia.  Continue on Namenda   DVT prophylaxis: Heparin Code Status: Full code Family Communication: Patient has a guardian at department of social services Disposition Plan: Return to nursing facility on discharge   Consultants:     Procedures:  Echo:- Left ventricle: The cavity size was normal. Wall thickness was  increased in a pattern of moderate LVH. Systolic function was   normal. The estimated ejection fraction was in the range of 55%   to 60%. Wall motion was normal; there were no regional wall   motion abnormalities. Doppler parameters are consistent with   abnormal left ventricular relaxation (grade 1 diastolic   dysfunction). - Aortic valve: Valve area (VTI): 3.44 cm^2. Valve area (Vmax):   3.98 cm^2. Valve area (Vmean): 3.58 cm^2. - Left atrium: The atrium was severely dilated.  - Right atrium: The atrium was moderately dilated.  Antimicrobials:   Levofloxacin 7/30 >   Subjective: Feeling better today.  No dizziness or lightheadedness.  Objective: Vitals:   10/11/17 0026 10/11/17 0540 10/11/17 1535 10/11/17 1935  BP: 129/77 (!) 125/58 (!) 117/97   Pulse: 76 71 69   Resp: 16 17 20    Temp: 98.1 F (36.7 C) 98.7 F (37.1 C) 98.3 F (36.8 C)   TempSrc: Oral Oral Oral   SpO2: 92% 93% 97% 92%  Weight:      Height:        Intake/Output Summary (Last 24 hours) at 10/11/2017 2015 Last data filed at 10/11/2017 1819 Gross per 24 hour  Intake 2140 ml  Output 2551 ml  Net -411 ml   Filed Weights   10/10/17 1232 10/11/17 0026  Weight: 84.4 kg (186 lb) 86.9 kg (191 lb 9.3 oz)    Examination:  General exam: Appears calm and comfortable  Respiratory system: Clear to auscultation. Respiratory effort normal. Cardiovascular system: S1 & S2 heard, RRR. No JVD, murmurs, rubs, gallops or clicks. No pedal edema. Gastrointestinal  system: Abdomen is nondistended, soft and nontender. No organomegaly or masses felt. Normal bowel sounds heard. Central nervous system: Alert and oriented. No focal neurological deficits. Extremities: Symmetric 5 x 5 power. Skin: No rashes, lesions or ulcers Psychiatry: Judgement and insight appear normal. Mood & affect appropriate.     Data Reviewed: I have personally reviewed following labs and imaging studies  CBC: Recent Labs  Lab 10/10/17 1416  10/11/17 0603  WBC 8.4 6.1  NEUTROABS 6.2 4.4  HGB 10.0* 9.3*  HCT 33.9* 30.9*  MCV 98.3 98.1  PLT 212 734   Basic Metabolic Panel: Recent Labs  Lab 10/10/17 1416 10/11/17 0603  NA 143 149*  K 4.2 4.4  CL 111 120*  CO2 23 23  GLUCOSE 89 88  BUN 49* 43*  CREATININE 3.14* 2.96*  CALCIUM 8.4* 8.1*   GFR: Estimated Creatinine Clearance: 24.8 mL/min (A) (by C-G formula based on SCr of 2.96 mg/dL (H)). Liver Function Tests: Recent Labs  Lab 10/10/17 1416  AST 13*  ALT 8  ALKPHOS 65  BILITOT 0.5  PROT 7.7  ALBUMIN 3.3*   No results for input(s): LIPASE, AMYLASE in the last 168 hours. No results for input(s): AMMONIA in the last 168 hours. Coagulation Profile: No results for input(s): INR, PROTIME in the last 168 hours. Cardiac Enzymes: Recent Labs  Lab 10/10/17 1416  TROPONINI <0.03   BNP (last 3 results) No results for input(s): PROBNP in the last 8760 hours. HbA1C: No results for input(s): HGBA1C in the last 72 hours. CBG: No results for input(s): GLUCAP in the last 168 hours. Lipid Profile: No results for input(s): CHOL, HDL, LDLCALC, TRIG, CHOLHDL, LDLDIRECT in the last 72 hours. Thyroid Function Tests: No results for input(s): TSH, T4TOTAL, FREET4, T3FREE, THYROIDAB in the last 72 hours. Anemia Panel: No results for input(s): VITAMINB12, FOLATE, FERRITIN, TIBC, IRON, RETICCTPCT in the last 72 hours. Sepsis Labs: Recent Labs  Lab 10/10/17 1416 10/10/17 1654  LATICACIDVEN 2.5* 0.8    Recent Results (from the past 240 hour(s))  Culture, blood (routine x 2) Call MD if unable to obtain prior to antibiotics being given     Status: None (Preliminary result)   Collection Time: 10/10/17  4:54 PM  Result Value Ref Range Status   Specimen Description LEFT ANTECUBITAL  Final   Special Requests   Final    BOTTLES DRAWN AEROBIC AND ANAEROBIC Blood Culture adequate volume   Culture   Final    NO GROWTH < 24 HOURS Performed at [facility], [address], [place], [place] [address]    Report Status PENDING  Incomplete  Culture, blood (routine x 2) Call MD if unable to obtain prior to antibiotics being given     Status: None (Preliminary result)   Collection Time: 10/10/17  4:59 PM  Result Value Ref Range Status   Specimen Description BLOOD RIGHT HAND  Final   Special Requests   Final    BOTTLES DRAWN AEROBIC AND ANAEROBIC Blood Culture adequate volume   Culture   Final    NO GROWTH < 24 HOURS Performed at [facility], [address] [place], [place] [address]    Report Status PENDING  Incomplete  MRSA PCR Screening     Status: None   Collection Time: 10/11/17 12:14 AM  Result Value Ref Range Status   MRSA by PCR NEGATIVE NEGATIVE Final    Comment:        The GeneXpert MRSA Assay (FDA approved for NASAL specimens  only), is one component of a comprehensive MRSA colonization surveillance program. It is not intended to diagnose MRSA infection nor to guide or monitor treatment for MRSA infections. Performed at [facility], [address], [place], [place] [address]          Radiology Studies: Dg Chest 2 View  Result Date: 10/10/2017 CLINICAL DATA:  Syncope. EXAM: CHEST - 2 VIEW COMPARISON:  Aug 05, 2017 FINDINGS: No pneumothorax. The heart, hila, and mediastinum are normal. The left lung is clear. Mild opacity in the right mid lung in the right lower lobe. No other abnormalities or changes. IMPRESSION: Mild opacity in the right mid lower lung could represent atelectasis or early infiltrate. Recommend short-term follow-up to ensure resolution. Electronically Signed   By: Dorise Bullion III M.D   On: 10/10/2017 14:14   Ct Head Wo Contrast  Result Date: 10/10/2017 CLINICAL DATA:  77 year old male status post syncope, struck back of head on wall. Mild confusion. EXAM: CT HEAD WITHOUT CONTRAST CT CERVICAL SPINE WITHOUT CONTRAST TECHNIQUE: Multidetector CT imaging of the head and cervical spine was performed following the standard  protocol without intravenous contrast. Multiplanar CT image reconstructions of the cervical spine were also generated. COMPARISON:  Head CT without contrast 08/05/2017. FINDINGS: CT HEAD FINDINGS Brain: Stable cerebral volume. No midline shift, ventriculomegaly, mass effect, evidence of mass lesion, intracranial hemorrhage or evidence of cortically based acute infarction. Stable gray-white matter differentiation throughout the brain. No cortical encephalomalacia identified. Vascular: Mild Calcified atherosclerosis at the skull base. Skull: Stable and intact. Sinuses/Orbits: Stable scattered sinus mucosal thickening or retention cysts. Generally well pneumatized sinuses overall. Tympanic cavities and mastoids remain clear. Other: No scalp hematoma or laceration identified. Stable and negative orbits soft tissues. CT CERVICAL SPINE FINDINGS Alignment: Straightening of cervical lordosis. Mild degenerative appearing anterolisthesis at C4-C5 and C7 on T1 with chronic facet degeneration at both levels. Skull base and vertebrae: Visualized skull base is intact. No atlanto-occipital dissociation. No cervical spine fracture identified. Soft tissues and spinal canal: No prevertebral fluid or swelling. No visible canal hematoma. Calcified cervical carotid atherosclerosis. Disc levels: Widespread cervical facet hypertrophy, moderate to severe on the right at C2-C3, on the left at C3-C4, on the left at C4-C5. Moderate to severe disc space loss with bulky endplate spurring at S9-P5. Subsequent mild to moderate spinal stenosis. Mild if any cervical spinal stenosis suspected elsewhere. Upper chest: The visible upper thoracic levels appear grossly intact. There is biapical lung scarring. Some paraseptal emphysema is visible on right. Negative noncontrast thoracic inlet. IMPRESSION: 1. No acute traumatic injury identified in the head or cervical spine. 2. Stable noncontrast CT appearance of the brain. 3. Advanced cervical spine  degeneration with mild to moderate spinal stenosis at C5-C6. Electronically Signed   By: Genevie Ann M.D.   On: 10/10/2017 15:43   Ct Cervical Spine Wo Contrast  Result Date: 10/10/2017 CLINICAL DATA:  77 year old male status post syncope, struck back of head on wall. Mild confusion. EXAM: CT HEAD WITHOUT CONTRAST CT CERVICAL SPINE WITHOUT CONTRAST TECHNIQUE: Multidetector CT imaging of the head and cervical spine was performed following the standard protocol without intravenous contrast. Multiplanar CT image reconstructions of the cervical spine were also generated. COMPARISON:  Head CT without contrast 08/05/2017. FINDINGS: CT HEAD FINDINGS Brain: Stable cerebral volume. No midline shift, ventriculomegaly, mass effect, evidence of mass lesion, intracranial hemorrhage or evidence of cortically based acute infarction. Stable gray-white matter differentiation throughout the brain. No cortical encephalomalacia identified. Vascular: Mild Calcified atherosclerosis at  the skull base. Skull: Stable and intact. Sinuses/Orbits: Stable scattered sinus mucosal thickening or retention cysts. Generally well pneumatized sinuses overall. Tympanic cavities and mastoids remain clear. Other: No scalp hematoma or laceration identified. Stable and negative orbits soft tissues. CT CERVICAL SPINE FINDINGS Alignment: Straightening of cervical lordosis. Mild degenerative appearing anterolisthesis at C4-C5 and C7 on T1 with chronic facet degeneration at both levels. Skull base and vertebrae: Visualized skull base is intact. No atlanto-occipital dissociation. No cervical spine fracture identified. Soft tissues and spinal canal: No prevertebral fluid or swelling. No visible canal hematoma. Calcified cervical carotid atherosclerosis. Disc levels: Widespread cervical facet hypertrophy, moderate to severe on the right at C2-C3, on the left at C3-C4, on the left at C4-C5. Moderate to severe disc space loss with bulky endplate spurring at P1-W2.  Subsequent mild to moderate spinal stenosis. Mild if any cervical spinal stenosis suspected elsewhere. Upper chest: The visible upper thoracic levels appear grossly intact. There is biapical lung scarring. Some paraseptal emphysema is visible on right. Negative noncontrast thoracic inlet. IMPRESSION: 1. No acute traumatic injury identified in the head or cervical spine. 2. Stable noncontrast CT appearance of the brain. 3. Advanced cervical spine degeneration with mild to moderate spinal stenosis at C5-C6. Electronically Signed   By: Genevie Ann M.D.   On: 10/10/2017 15:43        Scheduled Meds: . albuterol  2.5 mg Nebulization QID  . aspirin EC  81 mg Oral Daily  . baclofen  10 mg Oral BID  . buPROPion  150 mg Oral BID  . gabapentin  400 mg Oral TID  . ipratropium  0.5 mg Nebulization QID  . memantine  10 mg Oral BID  . mometasone-formoterol  2 puff Inhalation BID  . pantoprazole  40 mg Oral BID AC  . pramipexole  0.125 mg Oral QHS  . sertraline  50 mg Oral Daily  . tamsulosin  0.4 mg Oral Daily  . traZODone  200 mg Oral QHS   Continuous Infusions: . dextrose 100 mL/hr at 10/11/17 1358  . levofloxacin (LEVAQUIN) IV 750 mg (10/11/17 1819)     LOS: 1 day    Time spent: 16mins    Kathie Dike, MD [facility] Pager [phone]  If 7PM-7AM, please contact night-coverage [email or web address] Password [email or web address] 10/11/2017, 8:15 PM

## 2017-10-11 NOTE — Progress Notes (Signed)
Patient inspiratory flow checked.  Inspiratory flow adequate for effective medication administration. 

## 2017-10-11 NOTE — Progress Notes (Signed)
PHARMACY NOTE:  ANTIMICROBIAL RENAL DOSAGE ADJUSTMENT  Current antimicrobial regimen includes a mismatch between antimicrobial dosage and estimated renal function.  As per policy approved by the Pharmacy & Therapeutics and Medical Executive Committees, the antimicrobial dosage will be adjusted accordingly.  Current antimicrobial dosage:  Levofloxacin 750mg  IV q24h  Indication: HCAP  Renal Function:  Estimated Creatinine Clearance: 24.8 mL/min (A) (by C-G formula based on SCr of 2.96 mg/dL (H)). []      On intermittent HD, scheduled: []      On CRRT    Antimicrobial dosage has been changed to:  Levofloxacin 750mg  IV q48h  Additional comments:   Thank you for allowing pharmacy to be a part of this patient's care.  Vernie Ammons, [facility] 10/11/2017 5:47 PM

## 2017-10-11 NOTE — Progress Notes (Signed)
Talked to Pati Gallo, patient's legal guardian

## 2017-10-12 ENCOUNTER — Inpatient Hospital Stay (HOSPITAL_COMMUNITY): Payer: Medicare Other

## 2017-10-12 LAB — FOLATE: Folate: 12.3 ng/mL (ref >5.9)

## 2017-10-12 LAB — CBC
HEMATOCRIT: 29.7 % — AB (ref 39.0–52.0)
HEMOGLOBIN: 9.1 g/dL — AB (ref 13.0–17.0)
MCH: 29.9 pg (ref 26.0–34.0)
MCHC: 30.6 g/dL (ref 30.0–36.0)
MCV: 97.7 fL (ref 78.0–100.0)
Platelets: 177 10*3/uL (ref 150–400)
RBC: 3.04 MIL/uL — ABNORMAL LOW (ref 4.22–5.81)
RDW: 15.6 % — ABNORMAL HIGH (ref 11.5–15.5)
WBC: 5.7 10*3/uL (ref 4.0–10.5)

## 2017-10-12 LAB — SEDIMENTATION RATE: Sed Rate: 47 mm/hr — ABNORMAL HIGH (ref 0–16)

## 2017-10-12 LAB — VITAMIN B12: Vitamin B-12: 375 pg/mL (ref 180–914)

## 2017-10-12 LAB — RETICULOCYTES
RBC.: 3.03 MIL/uL — ABNORMAL LOW (ref 4.22–5.81)
Retic Count, Absolute: 33.3 10*3/uL (ref 19.0–186.0)
Retic Ct Pct: 1.1 % (ref 0.4–3.1)

## 2017-10-12 LAB — IRON AND TIBC
Iron: 50 ug/dL (ref 45–182)
Saturation Ratios: 28 % (ref 17.9–39.5)
TIBC: 181 ug/dL — ABNORMAL LOW (ref 250–450)
UIBC: 131 ug/dL

## 2017-10-12 LAB — BASIC METABOLIC PANEL
ANION GAP: 5 (ref 5–15)
BUN: 46 mg/dL — ABNORMAL HIGH (ref 8–23)
CALCIUM: 7.9 mg/dL — AB (ref 8.9–10.3)
CHLORIDE: 114 mmol/L — AB (ref 98–111)
CO2: 23 mmol/L (ref 22–32)
CREATININE: 3.11 mg/dL — AB (ref 0.61–1.24)
GFR calc non Af Amer: 18 mL/min — ABNORMAL LOW (ref >60)
GFR, EST AFRICAN AMERICAN: 20 mL/min — AB (ref >60)
Glucose, Bld: 106 mg/dL — ABNORMAL HIGH (ref 70–99)
Potassium: 4.1 mmol/L (ref 3.5–5.1)
SODIUM: 142 mmol/L (ref 135–145)

## 2017-10-12 LAB — SODIUM, URINE, RANDOM: Sodium, Ur: 41 mmol/L

## 2017-10-12 LAB — CREATININE, URINE, RANDOM: Creatinine, Urine: 31.65 mg/dL

## 2017-10-12 LAB — FERRITIN: Ferritin: 222 ng/mL (ref 24–336)

## 2017-10-12 MED ORDER — ACETAMINOPHEN 325 MG PO TABS
650.0000 mg | ORAL_TABLET | Freq: Four times a day (QID) | ORAL | Status: DC | PRN
  Administered 2017-10-12: 650 mg via ORAL
  Filled 2017-10-12: qty 2

## 2017-10-12 NOTE — Progress Notes (Signed)
PROGRESS NOTE    Anthony [name]  ZDG:[id number] DOB: Dec 07, 1940 DOA: 10/10/2017 PCP: Rosita Fire, MD    Brief Narrative:  77 year old male with history of dementia, COPD who is a resident of a nursing facility, was brought to the hospital with syncopal episode and possible UTI.  He also reports that he has been coughing.  Chest x-ray shows pneumonia and urinalysis indicates possible UTI.  Admitted for intravenous antibiotics and IV fluids.   Assessment & Plan:   Principal Problem:   HCAP (healthcare-associated pneumonia) Active Problems:   ARF (acute renal failure) (HCC)   COPD (chronic obstructive pulmonary disease) (HCC)   CKD (chronic kidney disease) stage 3, GFR 30-59 ml/min (HCC)   Dementia   Syncope and collapse   Acute lower UTI   1. Healthcare associated pneumonia.  Initially placed on vancomycin and Azactam.  Clinically appears to be improving.  Antibiotics have been changed to levofloxacin.  Continue to monitor.. 2. Acute renal failure.  Etiology is unclear, but over the past month creatinine has been trending up.  Continue IV fluids.  Mildly improved overnight after receiving hydration.  Renal ultrasound shows bilateral hydronephrosis with distended urinary bladder.  Will place Foley catheter.  Continue on Flomax.  Nephrology following 3. Syncope.  Suspect is related to hypovolemia.  Echocardiogram done does not show any acute findings.  He is not having any further symptoms. 4. Urinary tract infection.  Follow-up urine culture.  Continue levofloxacin. 5. COPD.  No evidence of shortness of breath or wheezing at this time.  Continue bronchodilators. 6. Chronic kidney disease stage III.  Creatinine has trended up from baseline in the past month.  Hold nephrotoxic agents.  Continue to monitor. 7. Dementia.  Continue on Namenda   DVT prophylaxis: Heparin Code Status: Full code Family Communication: Patient has a guardian at department of social services Disposition Plan:  Return to nursing facility on discharge   Consultants:   Nephrology  Procedures:  Echo:- Left ventricle: The cavity size was normal. Wall thickness was   increased in a pattern of moderate LVH. Systolic function was   normal. The estimated ejection fraction was in the range of 55%   to 60%. Wall motion was normal; there were no regional wall   motion abnormalities. Doppler parameters are consistent with   abnormal left ventricular relaxation (grade 1 diastolic   dysfunction). - Aortic valve: Valve area (VTI): 3.44 cm^2. Valve area (Vmax):   3.98 cm^2. Valve area (Vmean): 3.58 cm^2. - Left atrium: The atrium was severely dilated.  - Right atrium: The atrium was moderately dilated.  Antimicrobials:   Levofloxacin 7/30 >   Subjective: No new complaints.  No shortness of breath.  No chest pain.  No significant cough.  Objective: Vitals:   10/12/17 0711 10/12/17 0716 10/12/17 1355 10/12/17 1537  BP:   120/65   Pulse:   77   Resp:   20   Temp:   98.1 F (36.7 C)   TempSrc:   Oral   SpO2: 94% 94% 99% 94%  Weight:      Height:        Intake/Output Summary (Last 24 hours) at 10/12/2017 1853 Last data filed at 10/12/2017 1500 Gross per 24 hour  Intake 2240 ml  Output 1925 ml  Net 315 ml   Filed Weights   10/10/17 1232 10/11/17 0026  Weight: 84.4 kg (186 lb) 86.9 kg (191 lb 9.3 oz)    Examination:  General exam: Alert, awake, no distress Respiratory system:  Clear to auscultation. Respiratory effort normal. Cardiovascular system:RRR. No murmurs, rubs, gallops. Gastrointestinal system: Abdomen is nondistended, soft and nontender. No organomegaly or masses felt. Normal bowel sounds heard. Central nervous system: No focal neurological deficits. Extremities: 1+ edema bilaterally Skin: No rashes, lesions or ulcers Psychiatry: Pleasant, confused     Data Reviewed: I have personally reviewed following labs and imaging studies  CBC: Recent Labs  Lab 10/10/17 1416  10/11/17 0603 10/12/17 0505  WBC 8.4 6.1 5.7  NEUTROABS 6.2 4.4  --   HGB 10.0* 9.3* 9.1*  HCT 33.9* 30.9* 29.7*  MCV 98.3 98.1 97.7  PLT 212 189 409   Basic Metabolic Panel: Recent Labs  Lab 10/10/17 1416 10/11/17 0603 10/12/17 0505  NA 143 149* 142  K 4.2 4.4 4.1  CL 111 120* 114*  CO2 23 23 23   GLUCOSE 89 88 106*  BUN 49* 43* 46*  CREATININE 3.14* 2.96* 3.11*  CALCIUM 8.4* 8.1* 7.9*   GFR: Estimated Creatinine Clearance: 23.6 mL/min (A) (by C-G formula based on SCr of 3.11 mg/dL (H)). Liver Function Tests: Recent Labs  Lab 10/10/17 1416  AST 13*  ALT 8  ALKPHOS 65  BILITOT 0.5  PROT 7.7  ALBUMIN 3.3*   No results for input(s): LIPASE, AMYLASE in the last 168 hours. No results for input(s): AMMONIA in the last 168 hours. Coagulation Profile: No results for input(s): INR, PROTIME in the last 168 hours. Cardiac Enzymes: Recent Labs  Lab 10/10/17 1416  TROPONINI <0.03   BNP (last 3 results) No results for input(s): PROBNP in the last 8760 hours. HbA1C: No results for input(s): HGBA1C in the last 72 hours. CBG: No results for input(s): GLUCAP in the last 168 hours. Lipid Profile: No results for input(s): CHOL, HDL, LDLCALC, TRIG, CHOLHDL, LDLDIRECT in the last 72 hours. Thyroid Function Tests: No results for input(s): TSH, T4TOTAL, FREET4, T3FREE, THYROIDAB in the last 72 hours. Anemia Panel: Recent Labs    10/12/17 1052  VITAMINB12 375  FOLATE 12.3  FERRITIN 222  TIBC 181*  IRON 50  RETICCTPCT 1.1   Sepsis Labs: Recent Labs  Lab 10/10/17 1416 10/10/17 1654  LATICACIDVEN 2.5* 0.8    Recent Results (from the past 240 hour(s))  Urine culture     Status: Abnormal (Preliminary result)   Collection Time: 10/10/17  2:42 PM  Result Value Ref Range Status   Specimen Description   Final    URINE, CATHETERIZED Performed at [facility], [address] [place], [place] [address]    Special Requests   Final    NONE Performed at [facility], [address] [place], [place] [address]    Culture (A)  Final    >=100,000 COLONIES/mL ENTEROCOCCUS FAECALIS SUSCEPTIBILITIES TO FOLLOW Performed at [facility] [address], [place], [place] [address]    Report Status PENDING  Incomplete  Culture, blood (routine x 2) Call MD if unable to obtain prior to antibiotics being given     Status: None (Preliminary result)   Collection Time: 10/10/17  4:54 PM  Result Value Ref Range Status   Specimen Description LEFT ANTECUBITAL  Final   Special Requests   Final    BOTTLES DRAWN AEROBIC AND ANAEROBIC Blood Culture adequate volume   Culture   Final    NO GROWTH 2 DAYS Performed at [facility], [address] [place], [place] [address]    Report Status PENDING  Incomplete  Culture, blood (routine x 2) Call MD if unable  to obtain prior to antibiotics being given     Status: None (Preliminary result)   Collection Time: 10/10/17  4:59 PM  Result Value Ref Range Status   Specimen Description BLOOD RIGHT HAND  Final   Special Requests   Final    BOTTLES DRAWN AEROBIC AND ANAEROBIC Blood Culture adequate volume   Culture   Final    NO GROWTH 2 DAYS Performed at [facility], [address] [place], [place] [address]    Report Status PENDING  Incomplete  MRSA PCR Screening     Status: None   Collection Time: 10/11/17 12:14 AM  Result Value Ref Range Status   MRSA by PCR NEGATIVE NEGATIVE Final    Comment:        The GeneXpert MRSA Assay (FDA approved for NASAL specimens only), is one component of a comprehensive MRSA colonization surveillance program. It is not intended to diagnose MRSA infection nor to guide or monitor treatment for MRSA infections. Performed at [facility], [address], [place], [place] [address]          Radiology Studies: US Renal  Result Date: 10/12/2017 CLINICAL DATA:  Acute on chronic kidney disease. History of kidney stones. EXAM: RENAL / URINARY TRACT ULTRASOUND COMPLETE COMPARISON:   Abdominal and pelvic CT scan dated December 24, 2016 FINDINGS: Right Kidney: Length: 10 cm. The renal cortical echotexture remains slightly lower than that of the liver. There is moderate hydronephrosis. No discrete stones are observed. Left Kidney: Length: 9.6 cm. The renal cortical echotexture is similar to that on the right. There is moderate hydronephrosis. The proximal ureter is dilated as far as it can be followed. Bladder: The urinary bladder is moderately distended with a calculated volume of 277 cc. The patient has a condylar catheter on. IMPRESSION: Bilateral hydronephrosis slightly greater on the left than on the right. Mildly increased renal cortical echotexture consistent with chronic renal disease. Distended urinary bladder.  A Foley catheter may be useful. Electronically Signed   By: David  Martinique M.D.   On: 10/12/2017 15:19        Scheduled Meds: . aspirin EC  81 mg Oral Daily  . buPROPion  150 mg Oral BID  . gabapentin  400 mg Oral TID  . heparin injection (subcutaneous)  5,000 Units Subcutaneous Q8H  . ipratropium-albuterol  3 mL Nebulization TID  . memantine  10 mg Oral BID  . mometasone-formoterol  2 puff Inhalation BID  . pantoprazole  40 mg Oral BID AC  . pramipexole  0.125 mg Oral QHS  . sertraline  50 mg Oral Daily  . tamsulosin  0.4 mg Oral QHS  . traZODone  200 mg Oral QHS   Continuous Infusions: . dextrose 100 mL/hr at 10/12/17 1224  . levofloxacin (LEVAQUIN) IV Stopped (10/11/17 1949)     LOS: 2 days    Time spent: 51mins    Kathie Dike, MD [facility] Pager [phone]  If 7PM-7AM, please contact night-coverage [email or web address] Password [facility] 10/12/2017, 6:53 PM

## 2017-10-12 NOTE — Care Management Important Message (Signed)
Important Message  Patient Details  Name: Anthony [name] MRN: [id number] Date of Birth: 01-Sep-1940   Medicare Important Message Given:  Yes Pt. Unable to sign   [name], [place] 10/12/2017, 12:56 PM

## 2017-10-12 NOTE — Consult Note (Signed)
Anthony [name] MRN: [id number] DOB/AGE: December 08, 1940 77 y.o. Primary Care Physician:Fanta, Tesfaye, MD Admit date: 10/10/2017 Chief Complaint:  Chief Complaint  Patient presents with  . Loss of Consciousness   HPI: Pt is 77 year old male with past medical history of dementia, COPD who is a resident of a nursing facility, was sent  to the hospital with c/o syncopal episode . Upon evaluation in ER pt was found to have possible UTI and .   Chest x-ray showed  pneumonia . Pt was admitted for further care. Pt seen today on 3rd floor No c/o shortness of breath No c/o fever No c/o hematuria NO c/o OTC medication/ NSAIDS ( pt is nursing home resident , I reviewed the medication list) Pt was on IV vanco but not able to see any trough   Past Medical History:  Diagnosis Date  . Acute pyelonephritis 12/23/2014  . Asthma   . BPH (benign prostatic hyperplasia)   . Chronic back pain   . Chronic hip pain   . CKD (chronic kidney disease)   . COPD (chronic obstructive pulmonary disease) ([place])   . Dementia   . Depression   . GERD (gastroesophageal reflux disease)   . Kidney stone   . Neuropathy   . Pulmonary nodules 12/25/2014  . Restless leg syndrome         Family History  Family history unknown: Yes    Social History:  reports that he quit smoking about 4 months ago. His smoking use included cigarettes. He smoked 0.50 packs per day. He has never used smokeless tobacco. He reports that he does not drink alcohol or use drugs.   Allergies: reviewed   Medications Prior to Admission  Medication Sig Dispense Refill  . acetaminophen (TYLENOL) 500 MG tablet Take 500 mg by mouth 3 (three) times daily as needed for mild pain.     Marland Kitchen albuterol (PROVENTIL HFA;VENTOLIN HFA) 108 (90 BASE) MCG/ACT inhaler Inhale 2 puffs into the lungs every 4 (four) hours as needed for wheezing or shortness of breath.    Marland Kitchen albuterol (PROVENTIL) (2.5 MG/3ML) 0.083% nebulizer solution Take 3 mLs (2.5 mg total) by  nebulization every 4 (four) hours as needed for wheezing or shortness of breath. (Patient taking differently: Take 2.5 mg by nebulization 4 (four) times daily. ) 75 mL 12  . aspirin EC 81 MG tablet Take 81 mg by mouth daily.    . baclofen (LIORESAL) 10 MG tablet Take 10 mg by mouth 2 (two) times daily.    Marland Kitchen buPROPion (WELLBUTRIN SR) 150 MG 12 hr tablet Take 150 mg by mouth 2 (two) times daily.    . Calcium Citrate-Vitamin D (CALCIUM CITRATE+D3 PETITES) 200-250 MG-UNIT TABS Take 1 tablet by mouth daily.    . cholecalciferol (VITAMIN D) 1000 units tablet Take 2,000 Units by mouth daily.    . fluticasone (FLONASE) 50 MCG/ACT nasal spray Place 1 spray 2 (two) times daily into both nostrils.     . Fluticasone-Salmeterol (ADVAIR) 250-50 MCG/DOSE AEPB Inhale 1 puff 2 (two) times daily into the lungs.    . gabapentin (NEURONTIN) 400 MG capsule Take 400 mg by mouth 3 (three) times daily.    Marland Kitchen ipratropium (ATROVENT) 0.02 % nebulizer solution Take 2.5 mLs (0.5 mg total) by nebulization every 6 (six) hours as needed for wheezing or shortness of breath. (Patient taking differently: Take 0.5 mg by nebulization 4 (four) times daily. ) 75 mL 12  . memantine (NAMENDA) 10 MG tablet Take 10 mg by mouth 2 (  two) times daily.    . pantoprazole (PROTONIX) 40 MG tablet Take 1 tablet (40 mg total) by mouth 2 (two) times daily before a meal.    . pramipexole (MIRAPEX) 0.125 MG tablet Take 0.125 mg by mouth at bedtime.    . sennosides-docusate sodium (SENOKOT-S) 8.6-50 MG tablet Take 1 tablet by mouth daily as needed for constipation.    . sertraline (ZOLOFT) 50 MG tablet Take 50 mg by mouth daily.    . tamsulosin (FLOMAX) 0.4 MG CAPS capsule Take 0.4 mg by mouth daily.     . traMADol (ULTRAM) 50 MG tablet Take 1 tablet (50 mg total) by mouth every 6 (six) hours as needed for severe pain. (Patient taking differently: Take 50 mg by mouth 2 (two) times daily. ) 10 tablet 0  . traZODone (DESYREL) 100 MG tablet Take 200 mg by  mouth at bedtime.    . triamcinolone ointment (KENALOG) 0.5 % Apply 1 application topically 2 (two) times daily. Apply to affected areas of forearms twice daily.    . vitamin B-12 (CYANOCOBALAMIN) 1000 MCG tablet Take 1,000 mcg by mouth daily.    . Vitamin D, Ergocalciferol, (DRISDOL) 50000 units CAPS capsule Take 50,000 Units by mouth every Friday.     . cephALEXin (KEFLEX) 500 MG capsule Take 1 capsule (500 mg total) by mouth 2 (two) times daily. (Patient not taking: Reported on 10/10/2017) 10 capsule 0  . neomycin-bacitracin-polymyxin (NEOSPORIN) ointment Apply topically 3 (three) times daily. Apply to scalp wound. (Patient not taking: Reported on 09/09/2017) 15 g 0       IWL:NLGXQ from the symptoms mentioned above,there are no other symptoms referable to all systems reviewed.  Marland Kitchen aspirin EC  81 mg Oral Daily  . buPROPion  150 mg Oral BID  . gabapentin  400 mg Oral TID  . heparin injection (subcutaneous)  5,000 Units Subcutaneous Q8H  . ipratropium-albuterol  3 mL Nebulization TID  . memantine  10 mg Oral BID  . mometasone-formoterol  2 puff Inhalation BID  . pantoprazole  40 mg Oral BID AC  . pramipexole  0.125 mg Oral QHS  . sertraline  50 mg Oral Daily  . tamsulosin  0.4 mg Oral QHS  . traZODone  200 mg Oral QHS        Physical Exam: Vital signs in last 24 hours: Temp:  [98.2 F (36.8 C)-98.5 F (36.9 C)] 98.2 F (36.8 C) (07/31 0555) Pulse Rate:  [69-82] 74 (07/31 0555) Resp:  [20] 20 (07/30 2140) BP: (116-130)/(70-97) 116/70 (07/31 0555) SpO2:  [92 %-98 %] 94 % (07/31 0716) Weight change:  Last BM Date: 10/11/17  Intake/Output from previous day: 07/30 0701 - 07/31 0700 In: 2385 [P.O.:600; I.V.:1485; IV Piggyback:300] Out: 1194 [Urine:3375; Stool:1] No intake/output data recorded.   Physical Exam: General- pt is awake,alert, but oriented to person only Resp- No acute REsp distress, minimal Rhonchi CVS- S1S2 regular in rate and rhythm GIT- BS+, soft, NT,  ND EXT- NO LE Edema, Cyanosis CNS- CN 2-12 grossly intact. Moving all 4 extremities Psych- normal mood and affect    Lab Results: CBC Recent Labs    10/11/17 0603 10/12/17 0505  WBC 6.1 5.7  HGB 9.3* 9.1*  HCT 30.9* 29.7*  PLT 189 177    BMET Recent Labs    10/11/17 0603 10/12/17 0505  NA 149* 142  K 4.4 4.1  CL 120* 114*  CO2 23 23  GLUCOSE 88 106*  BUN 43* 46*  CREATININE 2.96* 3.11*  CALCIUM 8.1* 7.9*    Creat trend 2019  2.7--3.1 ( this admission)  April 1.4--1.8 2018  1.4--1.7 2016 1.3   MICRO Recent Results (from the past 240 hour(s))  Urine culture     Status: Abnormal (Preliminary result)   Collection Time: 10/10/17  2:42 PM  Result Value Ref Range Status   Specimen Description   Final    URINE, CATHETERIZED Performed at [facility], [address] [place], [place] [address]    Special Requests   Final    NONE Performed at [facility], [address], [place], [place] [address]    Culture (A)  Final    >=100,000 COLONIES/mL UNIDENTIFIED ORGANISM Performed at [facility], [place] [address] [place], [place] [address]    Report Status PENDING  Incomplete  Culture, blood (routine x 2) Call MD if unable to obtain prior to antibiotics being given     Status: None (Preliminary result)   Collection Time: 10/10/17  4:54 PM  Result Value Ref Range Status   Specimen Description LEFT ANTECUBITAL  Final   Special Requests   Final    BOTTLES DRAWN AEROBIC AND ANAEROBIC Blood Culture adequate volume   Culture   Final    NO GROWTH 2 DAYS Performed at [facility], [address], [place], [place] [address]    Report Status PENDING  Incomplete  Culture, blood (routine x 2) Call MD if unable to obtain prior to antibiotics being given     Status: None (Preliminary result)   Collection Time: 10/10/17  4:59 PM  Result Value Ref Range Status   Specimen Description BLOOD RIGHT HAND  Final   Special Requests   Final    BOTTLES DRAWN AEROBIC AND  ANAEROBIC Blood Culture adequate volume   Culture   Final    NO GROWTH 2 DAYS Performed at [facility], [address] [place], [place] [address]    Report Status PENDING  Incomplete  MRSA PCR Screening     Status: None   Collection Time: 10/11/17 12:14 AM  Result Value Ref Range Status   MRSA by PCR NEGATIVE NEGATIVE Final    Comment:        The GeneXpert MRSA Assay (FDA approved for NASAL specimens only), is one component of a comprehensive MRSA colonization surveillance program. It is not intended to diagnose MRSA infection nor to guide or monitor treatment for MRSA infections. Performed at [facility], [address], [place], [place] [address]       Lab Results  Component Value Date   CALCIUM 7.9 (L) 10/12/2017   CAION 1.16 12/24/2016      Impression: 1)Renal  AKI secondary to ATN/AIN/Vanco?( less likely as on vanco for less than 24 hrs)               ATN sec to Hypotension/Hypovolemia                    ER visit on 7/10-had ATN then creat was  2.7               AKI on CKD               CKD stage 3.               CKD since 2016               CKD secondary to HTN/ hx of Pyelo/Age asso decline  Progression of CKD marked with AKI                Proteinura will check.   2)HTN     3)Anemia HGb trending low   4)CKD Mineral-Bone Disorder PTH not avail . Secondary Hyperparathyroidism w/u pending. Vitamin 25-OH def hx- on replacement as outpt    5)ID-admitted with HCAP and UTI PMD following  6)Electrolytes Normokalemic NOrmonatremic   7)Acid base Co2 at goal     Plan:  Will ask for FENA work up Will ask for renal u/s Pt had had reticular  nodules in lungs so will ask for auto immune work for for pulmonary -renaol syndrome Agree with d/c vanco Will ask for spot level to check Will ask for post void residul Will ask for anemia work up      [employer] 10/12/2017, 10:13 AM

## 2017-10-12 NOTE — Progress Notes (Signed)
Patient noted to have clear, yellow urine output to external catheter drainage bag. NT states urine was present this am, unsure how much urine has drained today. Urine retention noted on renal US. Bladder scan showed >200 ml urine on 3 scans. Patient denies feeling pressure or urge to void. New external catheter and drainage bag applied this afternoon with no new urine output noted. Text paged Dr. Roderic Palau to notify. Donavan Foil, RN

## 2017-10-13 ENCOUNTER — Inpatient Hospital Stay (HOSPITAL_COMMUNITY): Payer: Medicare Other

## 2017-10-13 LAB — MPO/PR-3 (ANCA) ANTIBODIES
ANCA Proteinase 3: 7.5 U/mL — ABNORMAL HIGH (ref 0.0–3.5)
Myeloperoxidase Abs: <9 U/mL (ref 0.0–9.0)

## 2017-10-13 LAB — MICROALBUMIN / CREATININE URINE RATIO
Creatinine, Urine: 26.2 mg/dL
Microalb Creat Ratio: 23.3 mg/g creat (ref 0.0–30.0)
Microalb, Ur: 6.1 ug/mL — ABNORMAL HIGH

## 2017-10-13 LAB — ANTI-DNA ANTIBODY, DOUBLE-STRANDED: ds DNA Ab: 2 IU/mL (ref 0–9)

## 2017-10-13 LAB — ANCA TITERS
Atypical P-ANCA titer: <1:20 {titer}
C-ANCA: <1:20 {titer}
P-ANCA: <1:20 {titer}

## 2017-10-13 LAB — PROTEIN ELECTROPHORESIS, SERUM
A/G Ratio: 0.8 (ref 0.7–1.7)
Albumin ELP: 2.6 g/dL — ABNORMAL LOW (ref 2.9–4.4)
Alpha-1-Globulin: 0.3 g/dL (ref 0.0–0.4)
Alpha-2-Globulin: 0.7 g/dL (ref 0.4–1.0)
Beta Globulin: 0.8 g/dL (ref 0.7–1.3)
Gamma Globulin: 1.6 g/dL (ref 0.4–1.8)
Globulin, Total: 3.4 g/dL (ref 2.2–3.9)
Total Protein ELP: 6 g/dL (ref 6.0–8.5)

## 2017-10-13 LAB — BASIC METABOLIC PANEL
Anion gap: 6 (ref 5–15)
BUN: 45 mg/dL — AB (ref 8–23)
CHLORIDE: 113 mmol/L — AB (ref 98–111)
CO2: 22 mmol/L (ref 22–32)
CREATININE: 3.11 mg/dL — AB (ref 0.61–1.24)
Calcium: 8 mg/dL — ABNORMAL LOW (ref 8.9–10.3)
GFR calc Af Amer: 20 mL/min — ABNORMAL LOW (ref >60)
GFR calc non Af Amer: 18 mL/min — ABNORMAL LOW (ref >60)
Glucose, Bld: 112 mg/dL — ABNORMAL HIGH (ref 70–99)
POTASSIUM: 4.4 mmol/L (ref 3.5–5.1)
Sodium: 141 mmol/L (ref 135–145)

## 2017-10-13 LAB — COMPLEMENT, TOTAL: Compl, Total (CH50): >60 U/mL (ref 42–999999)

## 2017-10-13 LAB — C4 COMPLEMENT: Complement C4, Body Fluid: 27 mg/dL (ref 14–44)

## 2017-10-13 LAB — HEPATITIS PANEL, ACUTE
HCV Ab: <0.1 s/co ratio (ref 0.0–0.9)
Hep A IgM: NEGATIVE
Hep B C IgM: NEGATIVE
Hepatitis B Surface Ag: NEGATIVE

## 2017-10-13 LAB — GLOMERULAR BASEMENT MEMBRANE ANTIBODIES: GBM Ab: 5 units (ref 0–20)

## 2017-10-13 LAB — C3 COMPLEMENT: C3 Complement: 104 mg/dL (ref 82–167)

## 2017-10-13 MED ORDER — FOSFOMYCIN TROMETHAMINE 3 G PO PACK
3.0000 g | PACK | Freq: Once | ORAL | Status: AC
  Administered 2017-10-13: 3 g via ORAL
  Filled 2017-10-13: qty 3

## 2017-10-13 MED ORDER — FOSFOMYCIN TROMETHAMINE 3 G PO PACK
3.0000 g | PACK | Freq: Once | ORAL | Status: DC
  Filled 2017-10-13: qty 3

## 2017-10-13 NOTE — Progress Notes (Signed)
Subjective: Interval History: has no complaint of nausea or vomiting.  His appetite is good.  Patient denies any fever chills or sweating.  And no difficulty breathing..  Objective: Vital signs in last 24 hours: Temp:  [97.5 F (36.4 C)-98.1 F (36.7 C)] 97.7 F (36.5 C) (08/01 0603) Pulse Rate:  [77-82] 77 (08/01 0603) Resp:  [20] 20 (07/31 1355) BP: (120-138)/(61-65) 123/65 (08/01 0603) SpO2:  [91 %-99 %] 91 % (08/01 0718) Weight change:   Intake/Output from previous day: 07/31 0701 - 08/01 0700 In: 2240 [P.O.:240; I.V.:2000] Out: 8413 [Urine:3475] Intake/Output this shift: No intake/output data recorded.  General appearance: alert, cooperative and no distress Resp: clear to auscultation bilaterally Cardio: regular rate and rhythm Extremities: No edema  Lab Results: Recent Labs    10/11/17 0603 10/12/17 0505  WBC 6.1 5.7  HGB 9.3* 9.1*  HCT 30.9* 29.7*  PLT 189 177   BMET:  Recent Labs    10/12/17 0505 10/13/17 0432  NA 142 141  K 4.1 4.4  CL 114* 113*  CO2 23 22  GLUCOSE 106* 112*  BUN 46* 45*  CREATININE 3.11* 3.11*  CALCIUM 7.9* 8.0*   No results for input(s): PTH in the last 72 hours. Iron Studies:  Recent Labs    10/12/17 1052  IRON 50  TIBC 181*  FERRITIN 222    Studies/Results: US Renal  Result Date: 10/12/2017 CLINICAL DATA:  Acute on chronic kidney disease. History of kidney stones. EXAM: RENAL / URINARY TRACT ULTRASOUND COMPLETE COMPARISON:  Abdominal and pelvic CT scan dated December 24, 2016 FINDINGS: Right Kidney: Length: 10 cm. The renal cortical echotexture remains slightly lower than that of the liver. There is moderate hydronephrosis. No discrete stones are observed. Left Kidney: Length: 9.6 cm. The renal cortical echotexture is similar to that on the right. There is moderate hydronephrosis. The proximal ureter is dilated as far as it can be followed. Bladder: The urinary bladder is moderately distended with a calculated volume of 277  cc. The patient has a condylar catheter on. IMPRESSION: Bilateral hydronephrosis slightly greater on the left than on the right. Mildly increased renal cortical echotexture consistent with chronic renal disease. Distended urinary bladder.  A Foley catheter may be useful. Electronically Signed   By: David  Martinique M.D.   On: 10/12/2017 15:19    I have reviewed the patient's current medications.  Assessment/Plan: 1] acute kidney injury superimposed on chronic.  Etiology thought to be secondary to obstructive uropathy/AIN/vancomycin associated acute kidney injury.  Presently patient is nonoliguric.  His renal function seems to be stable.  Patient is  asymptomatic.   2] chronic renal failure: Stage III.  Etiology was felt to be secondary to hypertension and also CIN from pyelonephritis. 3] UTI: Patient is a febrile and white blood cell count is normal.  Urine culture growing enterococcus. 4] hypertension: His blood pressure is reasonably controlled 5] anemia: His hemoglobin is below target:.  Work-up is pending.  6] history of COPD 7] history of BPH: Patient with bilateral hydronephrosis  Presently he has Foley catheter. With 3400 cc 91f urine out put Plan:1] Increase IV fluid to 135 cc/h. 2] we will check renal panel and CBC in the morning.   LOS: 3 days   Donise Woodle S 10/13/2017,9:41 AM

## 2017-10-13 NOTE — Progress Notes (Signed)
PROGRESS NOTE    Anthony [name]  WEX:[id number] DOB: 02-15-1941 DOA: 10/10/2017 PCP: Rosita Fire, MD    Brief Narrative:  77 year old male with history of dementia, COPD who is a resident of a nursing facility, was brought to the hospital with syncopal episode and possible UTI.  He also reports that he has been coughing.  Chest x-ray shows pneumonia and urinalysis indicates possible UTI.  Admitted for intravenous antibiotics and IV fluids.   Assessment & Plan:   Principal Problem:   HCAP (healthcare-associated pneumonia) Active Problems:   ARF (acute renal failure) (HCC)   COPD (chronic obstructive pulmonary disease) (HCC)   CKD (chronic kidney disease) stage 3, GFR 30-59 ml/min (HCC)   Dementia   Syncope and collapse   Acute lower UTI   1. Healthcare associated pneumonia.  Initially placed on vancomycin and Azactam.  Clinically appears to be improving.  Antibiotics have been changed to levofloxacin.  This appears to be adequately treated.  He is not short of breath or febrile. 2. Acute renal failure.  Etiology is unclear, but over the past month creatinine has been trending up.  Continue IV fluids.  Nephrology following.  Renal ultrasound shows bilateral hydronephrosis with distended urinary bladder.  Foley catheter has been placed and he has good urine output.  Continue on Flomax.  Discussed with urology and recommendations are for CT renal stone study to rule out any underlying obstructing stones.  If he does not have any evidence of stones, he will likely need to continue Foley catheter for bladder outlet obstruction and follow-up with urology. 3. Syncope.  Suspect is related to hypovolemia.  Echocardiogram done does not show any acute findings.  He is not having any further symptoms. 4. Urinary tract infection.  Urine culture positive for enterococcus that is resistant to levofloxacin.  Patient has a penicillin allergy so cannot use ampicillin.  Due to his renal failure, vancomycin  would not be ideal.  After discussing with pharmacy, he will be given fosfomycin.. 5. COPD.  No evidence of shortness of breath or wheezing at this time.  Continue bronchodilators. 6. Chronic kidney disease stage III.  Creatinine has trended up from baseline in the past month.  Hold nephrotoxic agents.  Continue to monitor. 7. Dementia.  Continue on Namenda   DVT prophylaxis: Heparin Code Status: Full code Family Communication: Patient has a guardian at department of social services Disposition Plan: Return to nursing facility on discharge   Consultants:   Nephrology  Procedures:  Echo:- Left ventricle: The cavity size was normal. Wall thickness was   increased in a pattern of moderate LVH. Systolic function was   normal. The estimated ejection fraction was in the range of 55%   to 60%. Wall motion was normal; there were no regional wall   motion abnormalities. Doppler parameters are consistent with   abnormal left ventricular relaxation (grade 1 diastolic   dysfunction). - Aortic valve: Valve area (VTI): 3.44 cm^2. Valve area (Vmax):   3.98 cm^2. Valve area (Vmean): 3.58 cm^2. - Left atrium: The atrium was severely dilated.  - Right atrium: The atrium was moderately dilated.  Antimicrobials:   Levofloxacin 7/30 > 8/1  Fosfomycin 8/1 >   Subjective: Denies any shortness of breath.  Does not have any pain.  Feels well. Objective: Vitals:   10/13/17 0603 10/13/17 0718 10/13/17 1351 10/13/17 1504  BP: 123/65  132/74   Pulse: 77  77   Resp:   20   Temp: 97.7 F (36.5 C)  98 F (36.7 C)   TempSrc: Oral  Oral   SpO2: 97% 91% 97% 94%  Weight:      Height:        Intake/Output Summary (Last 24 hours) at 10/13/2017 1737 Last data filed at 10/13/2017 1100 Gross per 24 hour  Intake 1920 ml  Output 3475 ml  Net -1555 ml   Filed Weights   10/10/17 1232 10/11/17 0026  Weight: 84.4 kg (186 lb) 86.9 kg (191 lb 9.3 oz)    Examination:  General exam: Alert, awake, no  distress Respiratory system: Clear to auscultation. Respiratory effort normal. Cardiovascular system:RRR. No murmurs, rubs, gallops. Gastrointestinal system: Abdomen is nondistended, soft and nontender. No organomegaly or masses felt. Normal bowel sounds heard. Central nervous system: Alert and oriented. No focal neurological deficits. Extremities: 1+ edema Skin: No rashes, lesions or ulcers Psychiatry: Pleasantly confused  Data Reviewed: I have personally reviewed following labs and imaging studies  CBC: Recent Labs  Lab 10/10/17 1416 10/11/17 0603 10/12/17 0505  WBC 8.4 6.1 5.7  NEUTROABS 6.2 4.4  --   HGB 10.0* 9.3* 9.1*  HCT 33.9* 30.9* 29.7*  MCV 98.3 98.1 97.7  PLT 212 189 761   Basic Metabolic Panel: Recent Labs  Lab 10/10/17 1416 10/11/17 0603 10/12/17 0505 10/13/17 0432  NA 143 149* 142 141  K 4.2 4.4 4.1 4.4  CL 111 120* 114* 113*  CO2 23 23 23 22   GLUCOSE 89 88 106* 112*  BUN 49* 43* 46* 45*  CREATININE 3.14* 2.96* 3.11* 3.11*  CALCIUM 8.4* 8.1* 7.9* 8.0*   GFR: Estimated Creatinine Clearance: 23.6 mL/min (A) (by C-G formula based on SCr of 3.11 mg/dL (H)). Liver Function Tests: Recent Labs  Lab 10/10/17 1416  AST 13*  ALT 8  ALKPHOS 65  BILITOT 0.5  PROT 7.7  ALBUMIN 3.3*   No results for input(s): LIPASE, AMYLASE in the last 168 hours. No results for input(s): AMMONIA in the last 168 hours. Coagulation Profile: No results for input(s): INR, PROTIME in the last 168 hours. Cardiac Enzymes: Recent Labs  Lab 10/10/17 1416  TROPONINI <0.03   BNP (last 3 results) No results for input(s): PROBNP in the last 8760 hours. HbA1C: No results for input(s): HGBA1C in the last 72 hours. CBG: No results for input(s): GLUCAP in the last 168 hours. Lipid Profile: No results for input(s): CHOL, HDL, LDLCALC, TRIG, CHOLHDL, LDLDIRECT in the last 72 hours. Thyroid Function Tests: No results for input(s): TSH, T4TOTAL, FREET4, T3FREE, THYROIDAB in the  last 72 hours. Anemia Panel: Recent Labs    10/12/17 1052  VITAMINB12 375  FOLATE 12.3  FERRITIN 222  TIBC 181*  IRON 50  RETICCTPCT 1.1   Sepsis Labs: Recent Labs  Lab 10/10/17 1416 10/10/17 1654  LATICACIDVEN 2.5* 0.8    Recent Results (from the past 240 hour(s))  Urine culture     Status: Abnormal (Preliminary result)   Collection Time: 10/10/17  2:42 PM  Result Value Ref Range Status   Specimen Description   Final    URINE, CATHETERIZED Performed at [facility], [address], [place], [place] [address]    Special Requests   Final    NONE Performed at [facility], [address] [place], [place] [address]    Culture >=100,000 COLONIES/mL ENTEROCOCCUS FAECALIS (A)  Final   Report Status PENDING  Incomplete   Organism ID, Bacteria ENTEROCOCCUS FAECALIS (A)  Final      Susceptibility   Enterococcus faecalis - MIC*  AMPICILLIN <=2 SENSITIVE Sensitive     LEVOFLOXACIN >=8 RESISTANT Resistant     NITROFURANTOIN <=16 SENSITIVE Sensitive     VANCOMYCIN 1 SENSITIVE Sensitive     * >=100,000 COLONIES/mL ENTEROCOCCUS FAECALIS  Culture, blood (routine x 2) Call MD if unable to obtain prior to antibiotics being given     Status: None (Preliminary result)   Collection Time: 10/10/17  4:54 PM  Result Value Ref Range Status   Specimen Description LEFT ANTECUBITAL  Final   Special Requests   Final    BOTTLES DRAWN AEROBIC AND ANAEROBIC Blood Culture adequate volume   Culture   Final    NO GROWTH 3 DAYS Performed at [facility], [address], [place], [place] [address]    Report Status PENDING  Incomplete  Culture, blood (routine x 2) Call MD if unable to obtain prior to antibiotics being given     Status: None (Preliminary result)   Collection Time: 10/10/17  4:59 PM  Result Value Ref Range Status   Specimen Description BLOOD RIGHT HAND  Final   Special Requests   Final    BOTTLES DRAWN AEROBIC AND ANAEROBIC Blood Culture adequate volume   Culture   Final    NO  GROWTH 3 DAYS Performed at [facility], [address], [place], [place] [address]    Report Status PENDING  Incomplete  MRSA PCR Screening     Status: None   Collection Time: 10/11/17 12:14 AM  Result Value Ref Range Status   MRSA by PCR NEGATIVE NEGATIVE Final    Comment:        The GeneXpert MRSA Assay (FDA approved for NASAL specimens only), is one component of a comprehensive MRSA colonization surveillance program. It is not intended to diagnose MRSA infection nor to guide or monitor treatment for MRSA infections. Performed at [facility], [address] [place], [place] [address]          Radiology Studies: US Renal  Result Date: 10/12/2017 CLINICAL DATA:  Acute on chronic kidney disease. History of kidney stones. EXAM: RENAL / URINARY TRACT ULTRASOUND COMPLETE COMPARISON:  Abdominal and pelvic CT scan dated December 24, 2016 FINDINGS: Right Kidney: Length: 10 cm. The renal cortical echotexture remains slightly lower than that of the liver. There is moderate hydronephrosis. No discrete stones are observed. Left Kidney: Length: 9.6 cm. The renal cortical echotexture is similar to that on the right. There is moderate hydronephrosis. The proximal ureter is dilated as far as it can be followed. Bladder: The urinary bladder is moderately distended with a calculated volume of 277 cc. The patient has a condylar catheter on. IMPRESSION: Bilateral hydronephrosis slightly greater on the left than on the right. Mildly increased renal cortical echotexture consistent with chronic renal disease. Distended urinary bladder.  A Foley catheter may be useful. Electronically Signed   By: David  Martinique M.D.   On: 10/12/2017 15:19        Scheduled Meds: . aspirin EC  81 mg Oral Daily  . buPROPion  150 mg Oral BID  . [START ON 10/16/2017] fosfomycin  3 g Oral Once  . gabapentin  400 mg Oral TID  . heparin injection (subcutaneous)  5,000 Units Subcutaneous Q8H  . ipratropium-albuterol  3 mL  Nebulization TID  . memantine  10 mg Oral BID  . mometasone-formoterol  2 puff Inhalation BID  . pantoprazole  40 mg Oral BID AC  . pramipexole  0.125 mg Oral QHS  . sertraline  50 mg  Oral Daily  . tamsulosin  0.4 mg Oral QHS  . traZODone  200 mg Oral QHS   Continuous Infusions: . dextrose 135 mL/hr at 10/13/17 1014     LOS: 3 days    Time spent: 32mins    Kathie Dike, MD [facility] Pager [phone]  If 7PM-7AM, please contact night-coverage [email or web address] Password [facility] 10/13/2017, 5:37 PM

## 2017-10-14 LAB — RENAL FUNCTION PANEL
Albumin: 2.7 g/dL — ABNORMAL LOW (ref 3.5–5.0)
Anion gap: 7 (ref 5–15)
BUN: 41 mg/dL — AB (ref 8–23)
CO2: 21 mmol/L — ABNORMAL LOW (ref 22–32)
Calcium: 8 mg/dL — ABNORMAL LOW (ref 8.9–10.3)
Chloride: 111 mmol/L (ref 98–111)
Creatinine, Ser: 2.85 mg/dL — ABNORMAL HIGH (ref 0.61–1.24)
GFR calc Af Amer: 23 mL/min — ABNORMAL LOW (ref >60)
GFR, EST NON AFRICAN AMERICAN: 20 mL/min — AB (ref >60)
Glucose, Bld: 119 mg/dL — ABNORMAL HIGH (ref 70–99)
PHOSPHORUS: 3.9 mg/dL (ref 2.5–4.6)
Potassium: 4 mmol/L (ref 3.5–5.1)
Sodium: 139 mmol/L (ref 135–145)

## 2017-10-14 LAB — CBC
HEMATOCRIT: 29.8 % — AB (ref 39.0–52.0)
HEMOGLOBIN: 9.4 g/dL — AB (ref 13.0–17.0)
MCH: 29.8 pg (ref 26.0–34.0)
MCHC: 31.5 g/dL (ref 30.0–36.0)
MCV: 94.6 fL (ref 78.0–100.0)
Platelets: 191 10*3/uL (ref 150–400)
RBC: 3.15 MIL/uL — AB (ref 4.22–5.81)
RDW: 15.5 % (ref 11.5–15.5)
WBC: 5.3 10*3/uL (ref 4.0–10.5)

## 2017-10-14 LAB — URINE CULTURE

## 2017-10-14 LAB — ANTINUCLEAR ANTIBODIES, IFA: ANA Ab, IFA: NEGATIVE

## 2017-10-14 LAB — IMMUNOFIXATION ELECTROPHORESIS
IgA: 393 mg/dL (ref 61–437)
IgG (Immunoglobin G), Serum: 1598 mg/dL (ref 700–1600)
IgM (Immunoglobulin M), Srm: 84 mg/dL (ref 15–143)
Total Protein ELP: 6.2 g/dL (ref 6.0–8.5)

## 2017-10-14 MED ORDER — MILK AND MOLASSES ENEMA
1.0000 | Freq: Once | RECTAL | Status: AC
  Administered 2017-10-14: 250 mL via RECTAL

## 2017-10-14 MED ORDER — FOSFOMYCIN TROMETHAMINE 3 G PO PACK: 3.0000 g | PACK | Freq: Once | ORAL | 0 refills | Status: AC

## 2017-10-14 MED ORDER — MEMANTINE HCL 5 MG PO TABS: 10.0000 mg | ORAL_TABLET | Freq: Two times a day (BID) | ORAL | Status: DC

## 2017-10-14 MED ORDER — POLYETHYLENE GLYCOL 3350 17 G PO PACK: 17.0000 g | PACK | Freq: Every day | ORAL | 0 refills | Status: AC

## 2017-10-14 MED ORDER — MEMANTINE HCL 5 MG PO TABS: 5.0000 mg | ORAL_TABLET | Freq: Two times a day (BID) | ORAL | Status: DC

## 2017-10-14 NOTE — Evaluation (Signed)
Physical Therapy Evaluation Patient Details Name: Anthony [name] MRN: [id number] DOB: 07/22/1940 Today's Date: 10/14/2017   History of Present Illness  Anthony [name] is a 77 y.o. male with medical history significant of dementia, copd, sent in from snf for a syncopal episode and possible uti.  Pt says he has been coughing a lot.  Does not know of any n/v/d.  Denies any fevers or sob.  His history is unreliable due to his dementia.  Pt found to have pna on cxr.  And possible uti.  Paperwork from SNF is very limited and minimal and has no code status.    Clinical Impression  Patient very unsteady on feet, shaky movement with trembling of arms when attempting functional activity, able to transfer to BSC to have a bowel movement for transferring back to bedside, then to chair requiring assistance to prevent patient from falling backwards.  Patient stood with RW for up to 4-5 minutes while being cleaned after bowel movement and tolerated sitting up in chair after therapy - RN notified.  Plan:  Patient to be discharged back to ALF and discharged from physical therapy to care of nursing with recommendations below.    Follow Up Recommendations Home health PT;Supervision/Assistance - 24 hour;Supervision for mobility/OOB    Equipment Recommendations  None recommended by PT    Recommendations for Other Services       Precautions / Restrictions Precautions Precautions: Fall Restrictions Weight Bearing Restrictions: No      Mobility  Bed Mobility Overal bed mobility: Needs Assistance Bed Mobility: Supine to Sit     Supine to sit: Min assist        Transfers Overall transfer level: Needs assistance Equipment used: Rolling walker (2 wheeled);None Transfers: Sit to/from Stand;Stand Pivot Transfers Sit to Stand: Mod assist;Max assist Stand pivot transfers: Mod assist;Max assist       General transfer comment: very unsteady on feet, trembly  Ambulation/Gait Ambulation/Gait assistance: Max  assist Gait Distance (Feet): 2 Feet Assistive device: Rolling walker (2 wheeled) Gait Pattern/deviations: Decreased step length - right;Decreased step length - left;Decreased stride length Gait velocity: slow   General Gait Details: limited to 2-3 unsteady labored steps with frequent near loss of balance due to leaning backwards  Stairs            Wheelchair Mobility    Modified Rankin (Stroke Patients Only)       Balance Overall balance assessment: Needs assistance Sitting-balance support: Feet supported;No upper extremity supported Sitting balance-Leahy Scale: Good     Standing balance support: Bilateral upper extremity supported;During functional activity Standing balance-Leahy Scale: Poor Standing balance comment: using RW                             Pertinent Vitals/Pain Pain Assessment: No/denies pain    Home Living Family/patient expects to be discharged to:: Assisted living               Home Equipment: Wheelchair - manual      Prior Function Level of Independence: Needs assistance   Gait / Transfers Assistance Needed: 2 person assist transfers, uses wheelchair for mobility per patient  ADL's / Homemaking Assistance Needed: assisted by ALF staff        Hand Dominance        Extremity/Trunk Assessment   Upper Extremity Assessment Upper Extremity Assessment: Overall WFL for tasks assessed    Lower Extremity Assessment Lower Extremity Assessment: Generalized weakness      Cervical / Trunk Assessment Cervical / Trunk Assessment: Kyphotic  Communication   Communication: No difficulties  Cognition Arousal/Alertness: Awake/alert Behavior During Therapy: WFL for tasks assessed/performed Overall Cognitive Status: Within Functional Limits for tasks assessed                                        General Comments      Exercises     Assessment/Plan    PT Assessment All further PT needs can be met in the  next venue of care  PT Problem List Decreased strength;Decreased activity tolerance;Decreased balance;Decreased mobility       PT Treatment Interventions      PT Goals (Current goals can be found in the Care Plan section)  Acute Rehab PT Goals Patient Stated Goal: return home Time For Goal Achievement: 10/14/17 Potential to Achieve Goals: Good    Frequency     Barriers to discharge        Co-evaluation               AM-PAC PT "6 Clicks" Daily Activity  Outcome Measure Difficulty turning over in bed (including adjusting bedclothes, sheets and blankets)?: A Little Difficulty moving from lying on back to sitting on the side of the bed? : A Lot Difficulty sitting down on and standing up from a chair with arms (e.g., wheelchair, bedside commode, etc,.)?: A Lot Help needed moving to and from a bed to chair (including a wheelchair)?: A Lot Help needed walking in hospital room?: Total Help needed climbing 3-5 steps with a railing? : Total 6 Click Score: 11    End of Session   Activity Tolerance: Patient tolerated treatment well;Patient limited by fatigue Patient left: in chair;with call bell/phone within reach Nurse Communication: Mobility status;Other (comment)(RN informed that patient left up in chair) PT Visit Diagnosis: Unsteadiness on feet (R26.81);Other abnormalities of gait and mobility (R26.89);Muscle weakness (generalized) (M62.81)    Time: 1051-1134 PT Time Calculation (min) (ACUTE ONLY): 43 min   Charges:   PT Evaluation $PT Eval Moderate Complexity: 1 Mod PT Treatments $Therapeutic Activity: 38-52 mins        12:20 PM, 10/14/17  , MPT Physical Therapist with [facility] [phone] office [phone] mobile phone   

## 2017-10-14 NOTE — Clinical Social Work Note (Signed)
Facility notified of discharge and discharge clinicals sent. Message left for Netta Cedars advising of discharge.   LCSW signing off.   Nehemie Casserly, Clydene Pugh, LCSW

## 2017-10-14 NOTE — Progress Notes (Signed)
Subjective: Interval History: Patient offers no complaints.  Appetite is good and no difficulty breathing.  Objective: Vital signs in last 24 hours: Temp:  [98 F (36.7 C)-98.6 F (37 C)] 98 F (36.7 C) (08/02 0527) Pulse Rate:  [77-81] 78 (08/02 0527) Resp:  [18-20] 18 (08/01 1948) BP: (114-138)/(59-74) 114/59 (08/02 0527) SpO2:  [94 %-99 %] 95 % (08/02 0752) Weight change:   Intake/Output from previous day: 08/01 0701 - 08/02 0700 In: 720 [P.O.:720] Out: 2000 [Urine:2000] Intake/Output this shift: No intake/output data recorded.  General appearance: alert, cooperative and no distress Resp: clear to auscultation bilaterally Cardio: regular rate and rhythm Extremities: No edema  Lab Results: Recent Labs    10/12/17 0505 10/14/17 0420  WBC 5.7 5.3  HGB 9.1* 9.4*  HCT 29.7* 29.8*  PLT 177 191   BMET:  Recent Labs    10/13/17 0432 10/14/17 0420  NA 141 139  K 4.4 4.0  CL 113* 111  CO2 22 21*  GLUCOSE 112* 119*  BUN 45* 41*  CREATININE 3.11* 2.85*  CALCIUM 8.0* 8.0*   No results for input(s): PTH in the last 72 hours. Iron Studies:  Recent Labs    10/12/17 1052  IRON 50  TIBC 181*  FERRITIN 222    Studies/Results: US Renal  Result Date: 10/12/2017 CLINICAL DATA:  Acute on chronic kidney disease. History of kidney stones. EXAM: RENAL / URINARY TRACT ULTRASOUND COMPLETE COMPARISON:  Abdominal and pelvic CT scan dated December 24, 2016 FINDINGS: Right Kidney: Length: 10 cm. The renal cortical echotexture remains slightly lower than that of the liver. There is moderate hydronephrosis. No discrete stones are observed. Left Kidney: Length: 9.6 cm. The renal cortical echotexture is similar to that on the right. There is moderate hydronephrosis. The proximal ureter is dilated as far as it can be followed. Bladder: The urinary bladder is moderately distended with a calculated volume of 277 cc. The patient has a condylar catheter on. IMPRESSION: Bilateral  hydronephrosis slightly greater on the left than on the right. Mildly increased renal cortical echotexture consistent with chronic renal disease. Distended urinary bladder.  A Foley catheter may be useful. Electronically Signed   By: David  Martinique M.D.   On: 10/12/2017 15:19   Ct Renal Stone Study  Result Date: 10/13/2017 CLINICAL DATA:  Acute renal failure, concern for renal stone. EXAM: CT ABDOMEN AND PELVIS WITHOUT CONTRAST TECHNIQUE: Multidetector CT imaging of the abdomen and pelvis was performed following the standard protocol without IV contrast. COMPARISON:  Abdominal ultrasound 10/12/2017, CT 12/24/2016 FINDINGS: Lower chest: Normal heart size without pericardial effusion. Small pleural effusions left greater than right with adjacent atelectasis. Hepatobiliary: Given limitations of a noncontrast study, no acute solid organ pathology noted of the liver. No biliary dilatation. Normal gallbladder without stones. Pancreas: Inflammation, ductal dilatation or mass. Spleen: No splenomegaly or mass. Adrenals/Urinary Tract: Normal bilateral adrenal glands. Atrophic appearance of the kidneys. Moderate bilateral hydroureteronephrosis left greater than right without obstructive uropathy. The urinary bladder is decompressed by Foley catheter and somewhat thick-walled in appearance as a result. No definite intraluminal mass is identified. No ureteral calculi. Stomach/Bowel: Physiologic distention of the stomach with normal small bowel rotation. No bowel obstruction. Increased colonic stool retention throughout the colon consistent with constipation. Probable fecal impaction in the rectum with mild mural thickening suggestive of a stercoral proctitis. Vascular/Lymphatic: Infrarenal abdominal aortic aneurysm measuring 3 cm transverse with moderate atherosclerosis of the aorta and branch vessels. No adenopathy. Reproductive: Normal size prostate and seminal vesicles.  Other: Ventral hernia repair deep to the umbilicus.  Musculoskeletal: Grade 1 bordering grade 2 anterolisthesis of L5 on S1 with transitional vertebral anatomy. L5 spondylolysis. Lumbar spondylosis is noted as before. IMPRESSION: 1. Moderate bilateral hydroureteronephrosis without obstructing source identified. Ureters can be followed to the level of the urinary bladder which is decompressed by Foley catheter and somewhat thick-walled. Ureteral stenosis or potentially a mucosal abnormality of the ureters or bladder causing bilateral hydroureteronephrosis are some possible considerations. 2. Small bilateral pleural effusions with atelectasis, left greater than right. 3. Increased stool retention throughout the colon consistent constipation probable fecal impaction in the rectum. Mild mural thickening of the rectum raises concern for stercoral proctitis. 4. Stable infrarenal abdominal aortic aneurysm measuring approximately 3 cm transverse. 5. Lumbar spondylosis with spondylolysis of L5 and grade 1 bordering on grade 2 spondylolisthesis. Electronically Signed   By: Ashley Royalty M.D.   On: 10/13/2017 19:48    I have reviewed the patient's current medications.  Assessment/Plan: 1] acute kidney injury superimposed on chronic.  Etiology thought to be secondary to obstructive uropathy/AIN/vancomycin associated acute kidney injury.  His renal function is improving.  Patient is a symptomatic.  Patient 2] chronic renal failure: Stage III.  Etiology was felt to be secondary to hypertension and also CIN from pyelonephritis.  CT scan from yesterday still shows bilateral hydronephrosis even though his bladder is decompressed with Foley catheter placement.  There is no visible kidney stone.  Hence patient might have bilateral ureteral stenosis. 3] UTI: Patient is a febrile and white blood cell count is normal.  Urine culture growing enterococcus. 4] hypertension: His blood pressure is reasonably controlled 5] anemia: His hemoglobin is below target:.  Work-up is  pending.  6] history of COPD 7] history of BPH: Patient was Foley catheter and had 2 L of urine output. Plan:1] decrease IV fluid to 100 cc/h 2] we will check renal panel and CBC in the morning. 3] agree with neurology follow-up.  Since his renal function is improving patient could be discharged to be followed as an outpatient.   LOS: 4 days   Darcy Barbara S 10/14/2017,9:45 AM

## 2017-10-14 NOTE — NC FL2 (Deleted)
[place] MEDICAID FL2 LEVEL OF CARE SCREENING TOOL     IDENTIFICATION  Patient Name: Anthony [name] Birthdate: 1940-08-21 Sex: male Admission Date (Current Location): 10/10/2017  [facility] [place] and [place] Number:  [employer] and Address:  [place] [address], [place]      Provider Number: [id number]  Attending Physician Name and Address:  Kathie Dike, MD  Relative Name and Phone Number:       Current Level of Care: Hospital Recommended Level of Care: [place] Prior Approval Number:    Date Approved/Denied:   PASRR Number:    Discharge Plan: Other (Comment)(ALF)    Current Diagnoses: Patient Active Problem List   Diagnosis Date Noted  . Syncope and collapse 10/10/2017  . Acute lower UTI 10/10/2017  . AKI (acute kidney injury) ([place])   . Hematemesis 07/06/2017  . Normochromic normocytic anemia 06/19/2017  . CKD (chronic kidney disease) stage 3, GFR 30-59 ml/min (HCC) 06/19/2017  . Dementia 06/19/2017  . Decubitus ulcer 06/19/2017  . Open wnd of scalp   . S/p left hip fracture   . HCAP (healthcare-associated pneumonia) 10/31/2016  . CKD (chronic kidney disease) 10/31/2016  . COPD (chronic obstructive pulmonary disease) ([place]) 10/31/2016  . Hyponatremia 12/25/2014  . Hypokalemia 12/25/2014  . Normocytic anemia 12/25/2014  . Pulmonary nodules 12/25/2014  . Tobacco abuse 12/25/2014  . ARF (acute renal failure) ([place]) 12/24/2014  . Acute pyelonephritis 12/23/2014    Orientation RESPIRATION BLADDER Height & Weight     Self, Place  Normal Indwelling catheter Weight: 191 lb 9.3 oz (86.9 kg) Height:  6\' 4"  (193 cm)  BEHAVIORAL SYMPTOMS/MOOD NEUROLOGICAL BOWEL NUTRITION STATUS      Incontinent Diet(Heart Healthy (which facility states equates to their regular diet).)  AMBULATORY STATUS COMMUNICATION OF NEEDS Skin   Total Care(uses wheelchair ) Verbally Normal                       Personal Care Assistance  Level of Assistance  Bathing, Feeding, Dressing Bathing Assistance: Limited assistance Feeding assistance: Independent Dressing Assistance: Limited assistance     Functional Limitations Info  Sight, Hearing, Speech Sight Info: Adequate Hearing Info: Adequate Speech Info: Adequate    SPECIAL CARE FACTORS FREQUENCY                       Contractures Contractures Info: Not present    Additional Factors Info  Code Status, Allergies, Psychotropic Code Status Info: Full Code Allergies Info: Bee Venom, Penicillins, Strawberry Extract,  Psychotropic Info: Wellbutrin, Zoloft, Desyrel (Desyrel)         Current Medications (10/14/2017):  This is the current hospital active medication list Current Facility-Administered Medications  Medication Dose Route Frequency Provider Last Rate Last Dose  . acetaminophen (TYLENOL) tablet 650 mg  650 mg Oral Q6H PRN Lovey Newcomer T, NP   650 mg at 10/12/17 2256  . albuterol (PROVENTIL) (2.5 MG/3ML) 0.083% nebulizer solution 2.5 mg  2.5 mg Nebulization Q4H PRN Phillips Grout, MD      . aspirin EC tablet 81 mg  81 mg Oral Daily Derrill Kay A, MD   81 mg at 10/14/17 0917  . buPROPion (WELLBUTRIN SR) 12 hr tablet 150 mg  150 mg Oral BID Kathie Dike, MD   150 mg at 10/14/17 0916  . dextrose 5 % solution   Intravenous Continuous Fran Lowes, MD 135 mL/hr at 10/14/17 0928    . [  START ON 10/16/2017] fosfomycin (MONUROL) packet 3 g  3 g Oral Once Kathie Dike, MD      . gabapentin (NEURONTIN) capsule 400 mg  400 mg Oral TID Phillips Grout, MD   400 mg at 10/14/17 0917  . heparin injection 5,000 Units  5,000 Units Subcutaneous Q8H Kathie Dike, MD   5,000 Units at 10/14/17 0615  . ipratropium-albuterol (DUONEB) 0.5-2.5 (3) MG/3ML nebulizer solution 3 mL  3 mL Nebulization TID Kathie Dike, MD   3 mL at 10/14/17 0750  . memantine (NAMENDA) tablet 10 mg  10 mg Oral BID Kathie Dike, MD   10 mg at 10/14/17 0916  . mometasone-formoterol  (DULERA) 200-5 MCG/ACT inhaler 2 puff  2 puff Inhalation BID Kathie Dike, MD   2 puff at 10/14/17 0750  . pantoprazole (PROTONIX) EC tablet 40 mg  40 mg Oral BID AC Phillips Grout, MD   40 mg at 10/14/17 0759  . pramipexole (MIRAPEX) tablet 0.125 mg  0.125 mg Oral QHS Kathie Dike, MD   0.125 mg at 10/13/17 2315  . sertraline (ZOLOFT) tablet 50 mg  50 mg Oral Daily Kathie Dike, MD   50 mg at 10/14/17 0917  . tamsulosin (FLOMAX) capsule 0.4 mg  0.4 mg Oral QHS Kathie Dike, MD   0.4 mg at 10/13/17 2313  . traZODone (DESYREL) tablet 200 mg  200 mg Oral QHS Kathie Dike, MD   200 mg at 10/13/17 2313     Discharge Medications: Medication List    STOP taking these medications   cephALEXin 500 MG capsule Commonly known as:  KEFLEX   neomycin-bacitracin-polymyxin ointment Commonly known as:  NEOSPORIN     TAKE these medications   acetaminophen 500 MG tablet Commonly known as:  TYLENOL Take 500 mg by mouth 3 (three) times daily as needed for mild pain.   albuterol (2.5 MG/3ML) 0.083% nebulizer solution Commonly known as:  PROVENTIL Take 3 mLs (2.5 mg total) by nebulization every 4 (four) hours as needed for wheezing or shortness of breath. What changed:  when to take this   albuterol 108 (90 Base) MCG/ACT inhaler Commonly known as:  PROVENTIL HFA;VENTOLIN HFA Inhale 2 puffs into the lungs every 4 (four) hours as needed for wheezing or shortness of breath. What changed:  Another medication with the same name was changed. Make sure you understand how and when to take each.   aspirin EC 81 MG tablet Take 81 mg by mouth daily.   baclofen 10 MG tablet Commonly known as:  LIORESAL Take 10 mg by mouth 2 (two) times daily.   buPROPion 150 MG 12 hr tablet Commonly known as:  WELLBUTRIN SR Take 150 mg by mouth 2 (two) times daily.   CALCIUM CITRATE+D3 PETITES 200-250 MG-UNIT Tabs Generic drug:  Calcium Citrate-Vitamin D Take 1 tablet by mouth daily.    cholecalciferol 1000 units tablet Commonly known as:  VITAMIN D Take 2,000 Units by mouth daily.   fluticasone 50 MCG/ACT nasal spray Commonly known as:  FLONASE Place 1 spray 2 (two) times daily into both nostrils.   Fluticasone-Salmeterol 250-50 MCG/DOSE Aepb Commonly known as:  ADVAIR Inhale 1 puff 2 (two) times daily into the lungs.   fosfomycin 3 g Pack Commonly known as:  MONUROL Take 3 g by mouth once for 1 dose. On 10/16/17 Start taking on:  10/16/2017   gabapentin 400 MG capsule Commonly known as:  NEURONTIN Take 400 mg by mouth 3 (three) times daily.   ipratropium 0.02 %  nebulizer solution Commonly known as:  ATROVENT Take 2.5 mLs (0.5 mg total) by nebulization every 6 (six) hours as needed for wheezing or shortness of breath. What changed:  when to take this   memantine 5 MG tablet Commonly known as:  NAMENDA Take 1 tablet (5 mg total) by mouth 2 (two) times daily. What changed:    medication strength  how much to take   pantoprazole 40 MG tablet Commonly known as:  PROTONIX Take 1 tablet (40 mg total) by mouth 2 (two) times daily before a meal.   polyethylene glycol packet Commonly known as:  MIRALAX Take 17 g by mouth daily.   pramipexole 0.125 MG tablet Commonly known as:  MIRAPEX Take 0.125 mg by mouth at bedtime.   sennosides-docusate sodium 8.6-50 MG tablet Commonly known as:  SENOKOT-S Take 1 tablet by mouth daily as needed for constipation.   sertraline 50 MG tablet Commonly known as:  ZOLOFT Take 50 mg by mouth daily.   tamsulosin 0.4 MG Caps capsule Commonly known as:  FLOMAX Take 0.4 mg by mouth daily.   traMADol 50 MG tablet Commonly known as:  ULTRAM Take 1 tablet (50 mg total) by mouth every 6 (six) hours as needed for severe pain. What changed:  when to take this   traZODone 100 MG tablet Commonly known as:  DESYREL Take 200 mg by mouth at bedtime.   triamcinolone ointment 0.5 % Commonly known as:   KENALOG Apply 1 application topically 2 (two) times daily. Apply to affected areas of forearms twice daily.   vitamin B-12 1000 MCG tablet Commonly known as:  CYANOCOBALAMIN Take 1,000 mcg by mouth daily.   Vitamin D (Ergocalciferol) 50000 units Caps capsule Commonly known as:  DRISDOL Take 50,000 Units by mouth every Friday.         Relevant Imaging Results:  Relevant Lab Results:   Additional Information    Makeda Peeks, Clydene Pugh, LCSW

## 2017-10-14 NOTE — Discharge Summary (Signed)
Physician Discharge Summary  Anthony [name] SHF:[id number] DOB: 03/09/1941 DOA: 10/10/2017  PCP: Rosita Fire, MD  Admit date: 10/10/2017 Discharge date: 10/14/2017  Admitted From: ALF Disposition:  ALF  Recommendations for Outpatient Follow-up:  1. Follow up with PCP in 1-2 weeks 2. Please obtain BMP/CBC in one week 3. Follow up with nephrology in 4 weeks 4. Follow up with urology in 1 week. Will need repeat renal ultrasound on follow up  Home Health: Equipment/Devices:foley catheter  Discharge Condition:stable CODE STATUS: full code Diet recommendation: heart healthy  Brief/Interim Summary: 77 year old male with history of dementia, COPD who is a resident of a nursing facility, was brought to the hospital with syncopal episode and possible UTI.  He also reports that he has been coughing.  Chest x-ray shows pneumonia and urinalysis indicates possible UTI.  Admitted for intravenous antibiotics and IV fluids.    Discharge Diagnoses:  Principal Problem:   HCAP (healthcare-associated pneumonia) Active Problems:   ARF (acute renal failure) (HCC)   COPD (chronic obstructive pulmonary disease) (HCC)   CKD (chronic kidney disease) stage 3, GFR 30-59 ml/min (HCC)   Dementia   Syncope and collapse   Acute lower UTI  1. Healthcare associated pneumonia.  Initially placed on vancomycin and Azactam.  Clinically appears to be improving.  Antibiotics were changed to levofloxacin.  This appears to have been adequately treated.  He is not short of breath or febrile. 2. Acute renal failure.  Over the past month creatinine has been trending up.  Patient was treated with Iv fluids.  Nephrology following.  Renal ultrasound showed bilateral hydronephrosis with distended urinary bladder.  Foley catheter placed and he had good urine output. Continue on Flomax.  Patient underwent CT abdomen that did not show any underlying obstructing stones leading to hydronephrosis. Discussed with urology and  recommendations are to continue foley catheter on discharge and have close follow up in a week with urology with repeat renal ultrasound.  Patient likely has some element of chronic bladder outlet obstruction. Overall creatinine has been stable/mild improvement. Discussed with nephrology and felt ok to discharge with outpatient follow up 3. Syncope.  Suspect is related to hypovolemia.  Echocardiogram done does not show any acute findings.  He did not having any further symptoms. 4. Urinary tract infection.  Urine culture positive for enterococcus that is resistant to levofloxacin.  Patient has a penicillin allergy so cannot use ampicillin.  Due to his renal failure, vancomycin would not be ideal.  After discussing with pharmacy, he received fosfomycin.. 5. COPD.  No evidence of shortness of breath or wheezing at this time.  Continue bronchodilators. 6. Chronic kidney disease stage III.  Creatinine has trended up from baseline in the past month.  Hold nephrotoxic agents.  Continue to monitor. 7. Dementia.  Continue on Namenda     Discharge Instructions  Discharge Instructions    Diet - low sodium heart healthy   Complete by:  As directed    Increase activity slowly   Complete by:  As directed      Allergies as of 10/14/2017      Reactions   Bee Venom Anaphylaxis   Penicillins Hives      Strawberry Extract Rash      Medication List    STOP taking these medications   cephALEXin 500 MG capsule Commonly known as:  KEFLEX   neomycin-bacitracin-polymyxin ointment Commonly known as:  NEOSPORIN     TAKE these medications   acetaminophen 500 MG tablet Commonly known as:  TYLENOL Take 500 mg by mouth 3 (three) times daily as needed for mild pain.   albuterol (2.5 MG/3ML) 0.083% nebulizer solution Commonly known as:  PROVENTIL Take 3 mLs (2.5 mg total) by nebulization every 4 (four) hours as needed for wheezing or shortness of breath. What changed:  when to take this   albuterol 108  (90 Base) MCG/ACT inhaler Commonly known as:  PROVENTIL HFA;VENTOLIN HFA Inhale 2 puffs into the lungs every 4 (four) hours as needed for wheezing or shortness of breath. What changed:  Another medication with the same name was changed. Make sure you understand how and when to take each.   aspirin EC 81 MG tablet Take 81 mg by mouth daily.   baclofen 10 MG tablet Commonly known as:  LIORESAL Take 10 mg by mouth 2 (two) times daily.   buPROPion 150 MG 12 hr tablet Commonly known as:  WELLBUTRIN SR Take 150 mg by mouth 2 (two) times daily.   CALCIUM CITRATE+D3 PETITES 200-250 MG-UNIT Tabs Generic drug:  Calcium Citrate-Vitamin D Take 1 tablet by mouth daily.   cholecalciferol 1000 units tablet Commonly known as:  VITAMIN D Take 2,000 Units by mouth daily.   fluticasone 50 MCG/ACT nasal spray Commonly known as:  FLONASE Place 1 spray 2 (two) times daily into both nostrils.   Fluticasone-Salmeterol 250-50 MCG/DOSE Aepb Commonly known as:  ADVAIR Inhale 1 puff 2 (two) times daily into the lungs.   fosfomycin 3 g Pack Commonly known as:  MONUROL Take 3 g by mouth once for 1 dose. On 10/16/17 Start taking on:  10/16/2017   gabapentin 400 MG capsule Commonly known as:  NEURONTIN Take 400 mg by mouth 3 (three) times daily.   ipratropium 0.02 % nebulizer solution Commonly known as:  ATROVENT Take 2.5 mLs (0.5 mg total) by nebulization every 6 (six) hours as needed for wheezing or shortness of breath. What changed:  when to take this   memantine 5 MG tablet Commonly known as:  NAMENDA Take 1 tablet (5 mg total) by mouth 2 (two) times daily. What changed:    medication strength  how much to take   pantoprazole 40 MG tablet Commonly known as:  PROTONIX Take 1 tablet (40 mg total) by mouth 2 (two) times daily before a meal.   polyethylene glycol packet Commonly known as:  MIRALAX Take 17 g by mouth daily.   pramipexole 0.125 MG tablet Commonly known as:  MIRAPEX Take  0.125 mg by mouth at bedtime.   sennosides-docusate sodium 8.6-50 MG tablet Commonly known as:  SENOKOT-S Take 1 tablet by mouth daily as needed for constipation.   sertraline 50 MG tablet Commonly known as:  ZOLOFT Take 50 mg by mouth daily.   tamsulosin 0.4 MG Caps capsule Commonly known as:  FLOMAX Take 0.4 mg by mouth daily.   traMADol 50 MG tablet Commonly known as:  ULTRAM Take 1 tablet (50 mg total) by mouth every 6 (six) hours as needed for severe pain. What changed:  when to take this   traZODone 100 MG tablet Commonly known as:  DESYREL Take 200 mg by mouth at bedtime.   triamcinolone ointment 0.5 % Commonly known as:  KENALOG Apply 1 application topically 2 (two) times daily. Apply to affected areas of forearms twice daily.   vitamin B-12 1000 MCG tablet Commonly known as:  CYANOCOBALAMIN Take 1,000 mcg by mouth daily.   Vitamin D (Ergocalciferol) 50000 units Caps capsule Commonly known as:  DRISDOL Take 50,000  Units by mouth every Friday.      Follow-up Information    Fran Lowes, MD Follow up in 4 week(s).   Specialty:  Nephrology Contact information: [address] [place] [address] [phone]          Allergies  Allergen Reactions  . Bee Venom Anaphylaxis  . Penicillins Hives      . Strawberry Extract Rash    Consultations:  nephrology   Procedures/Studies: Dg Chest 2 View  Result Date: 10/10/2017 CLINICAL DATA:  Syncope. EXAM: CHEST - 2 VIEW COMPARISON:  Aug 05, 2017 FINDINGS: No pneumothorax. The heart, hila, and mediastinum are normal. The left lung is clear. Mild opacity in the right mid lung in the right lower lobe. No other abnormalities or changes. IMPRESSION: Mild opacity in the right mid lower lung could represent atelectasis or early infiltrate. Recommend short-term follow-up to ensure resolution. Electronically Signed   By: Dorise Bullion III M.D   On: 10/10/2017 14:14   Ct Head Wo Contrast  Result  Date: 10/10/2017 CLINICAL DATA:  77 year old male status post syncope, struck back of head on wall. Mild confusion. EXAM: CT HEAD WITHOUT CONTRAST CT CERVICAL SPINE WITHOUT CONTRAST TECHNIQUE: Multidetector CT imaging of the head and cervical spine was performed following the standard protocol without intravenous contrast. Multiplanar CT image reconstructions of the cervical spine were also generated. COMPARISON:  Head CT without contrast 08/05/2017. FINDINGS: CT HEAD FINDINGS Brain: Stable cerebral volume. No midline shift, ventriculomegaly, mass effect, evidence of mass lesion, intracranial hemorrhage or evidence of cortically based acute infarction. Stable gray-white matter differentiation throughout the brain. No cortical encephalomalacia identified. Vascular: Mild Calcified atherosclerosis at the skull base. Skull: Stable and intact. Sinuses/Orbits: Stable scattered sinus mucosal thickening or retention cysts. Generally well pneumatized sinuses overall. Tympanic cavities and mastoids remain clear. Other: No scalp hematoma or laceration identified. Stable and negative orbits soft tissues. CT CERVICAL SPINE FINDINGS Alignment: Straightening of cervical lordosis. Mild degenerative appearing anterolisthesis at C4-C5 and C7 on T1 with chronic facet degeneration at both levels. Skull base and vertebrae: Visualized skull base is intact. No atlanto-occipital dissociation. No cervical spine fracture identified. Soft tissues and spinal canal: No prevertebral fluid or swelling. No visible canal hematoma. Calcified cervical carotid atherosclerosis. Disc levels: Widespread cervical facet hypertrophy, moderate to severe on the right at C2-C3, on the left at C3-C4, on the left at C4-C5. Moderate to severe disc space loss with bulky endplate spurring at D3-U2. Subsequent mild to moderate spinal stenosis. Mild if any cervical spinal stenosis suspected elsewhere. Upper chest: The visible upper thoracic levels appear grossly  intact. There is biapical lung scarring. Some paraseptal emphysema is visible on right. Negative noncontrast thoracic inlet. IMPRESSION: 1. No acute traumatic injury identified in the head or cervical spine. 2. Stable noncontrast CT appearance of the brain. 3. Advanced cervical spine degeneration with mild to moderate spinal stenosis at C5-C6. Electronically Signed   By: Genevie Ann M.D.   On: 10/10/2017 15:43   Ct Cervical Spine Wo Contrast  Result Date: 10/10/2017 CLINICAL DATA:  77 year old male status post syncope, struck back of head on wall. Mild confusion. EXAM: CT HEAD WITHOUT CONTRAST CT CERVICAL SPINE WITHOUT CONTRAST TECHNIQUE: Multidetector CT imaging of the head and cervical spine was performed following the standard protocol without intravenous contrast. Multiplanar CT image reconstructions of the cervical spine were also generated. COMPARISON:  Head CT without contrast 08/05/2017. FINDINGS: CT HEAD FINDINGS Brain: Stable cerebral volume. No midline shift, ventriculomegaly, mass effect, evidence  of mass lesion, intracranial hemorrhage or evidence of cortically based acute infarction. Stable gray-white matter differentiation throughout the brain. No cortical encephalomalacia identified. Vascular: Mild Calcified atherosclerosis at the skull base. Skull: Stable and intact. Sinuses/Orbits: Stable scattered sinus mucosal thickening or retention cysts. Generally well pneumatized sinuses overall. Tympanic cavities and mastoids remain clear. Other: No scalp hematoma or laceration identified. Stable and negative orbits soft tissues. CT CERVICAL SPINE FINDINGS Alignment: Straightening of cervical lordosis. Mild degenerative appearing anterolisthesis at C4-C5 and C7 on T1 with chronic facet degeneration at both levels. Skull base and vertebrae: Visualized skull base is intact. No atlanto-occipital dissociation. No cervical spine fracture identified. Soft tissues and spinal canal: No prevertebral fluid or  swelling. No visible canal hematoma. Calcified cervical carotid atherosclerosis. Disc levels: Widespread cervical facet hypertrophy, moderate to severe on the right at C2-C3, on the left at C3-C4, on the left at C4-C5. Moderate to severe disc space loss with bulky endplate spurring at L9-F7. Subsequent mild to moderate spinal stenosis. Mild if any cervical spinal stenosis suspected elsewhere. Upper chest: The visible upper thoracic levels appear grossly intact. There is biapical lung scarring. Some paraseptal emphysema is visible on right. Negative noncontrast thoracic inlet. IMPRESSION: 1. No acute traumatic injury identified in the head or cervical spine. 2. Stable noncontrast CT appearance of the brain. 3. Advanced cervical spine degeneration with mild to moderate spinal stenosis at C5-C6. Electronically Signed   By: Genevie Ann M.D.   On: 10/10/2017 15:43   US Renal  Result Date: 10/12/2017 CLINICAL DATA:  Acute on chronic kidney disease. History of kidney stones. EXAM: RENAL / URINARY TRACT ULTRASOUND COMPLETE COMPARISON:  Abdominal and pelvic CT scan dated December 24, 2016 FINDINGS: Right Kidney: Length: 10 cm. The renal cortical echotexture remains slightly lower than that of the liver. There is moderate hydronephrosis. No discrete stones are observed. Left Kidney: Length: 9.6 cm. The renal cortical echotexture is similar to that on the right. There is moderate hydronephrosis. The proximal ureter is dilated as far as it can be followed. Bladder: The urinary bladder is moderately distended with a calculated volume of 277 cc. The patient has a condylar catheter on. IMPRESSION: Bilateral hydronephrosis slightly greater on the left than on the right. Mildly increased renal cortical echotexture consistent with chronic renal disease. Distended urinary bladder.  A Foley catheter may be useful. Electronically Signed   By: David  Martinique M.D.   On: 10/12/2017 15:19   Ct Renal Stone Study  Result Date:  10/13/2017 CLINICAL DATA:  Acute renal failure, concern for renal stone. EXAM: CT ABDOMEN AND PELVIS WITHOUT CONTRAST TECHNIQUE: Multidetector CT imaging of the abdomen and pelvis was performed following the standard protocol without IV contrast. COMPARISON:  Abdominal ultrasound 10/12/2017, CT 12/24/2016 FINDINGS: Lower chest: Normal heart size without pericardial effusion. Small pleural effusions left greater than right with adjacent atelectasis. Hepatobiliary: Given limitations of a noncontrast study, no acute solid organ pathology noted of the liver. No biliary dilatation. Normal gallbladder without stones. Pancreas: Inflammation, ductal dilatation or mass. Spleen: No splenomegaly or mass. Adrenals/Urinary Tract: Normal bilateral adrenal glands. Atrophic appearance of the kidneys. Moderate bilateral hydroureteronephrosis left greater than right without obstructive uropathy. The urinary bladder is decompressed by Foley catheter and somewhat thick-walled in appearance as a result. No definite intraluminal mass is identified. No ureteral calculi. Stomach/Bowel: Physiologic distention of the stomach with normal small bowel rotation. No bowel obstruction. Increased colonic stool retention throughout the colon consistent with constipation. Probable fecal impaction in the rectum  with mild mural thickening suggestive of a stercoral proctitis. Vascular/Lymphatic: Infrarenal abdominal aortic aneurysm measuring 3 cm transverse with moderate atherosclerosis of the aorta and branch vessels. No adenopathy. Reproductive: Normal size prostate and seminal vesicles. Other: Ventral hernia repair deep to the umbilicus. Musculoskeletal: Grade 1 bordering grade 2 anterolisthesis of L5 on S1 with transitional vertebral anatomy. L5 spondylolysis. Lumbar spondylosis is noted as before. IMPRESSION: 1. Moderate bilateral hydroureteronephrosis without obstructing source identified. Ureters can be followed to the level of the urinary bladder  which is decompressed by Foley catheter and somewhat thick-walled. Ureteral stenosis or potentially a mucosal abnormality of the ureters or bladder causing bilateral hydroureteronephrosis are some possible considerations. 2. Small bilateral pleural effusions with atelectasis, left greater than right. 3. Increased stool retention throughout the colon consistent constipation probable fecal impaction in the rectum. Mild mural thickening of the rectum raises concern for stercoral proctitis. 4. Stable infrarenal abdominal aortic aneurysm measuring approximately 3 cm transverse. 5. Lumbar spondylosis with spondylolysis of L5 and grade 1 bordering on grade 2 spondylolisthesis. Electronically Signed   By: Ashley Royalty M.D.   On: 10/13/2017 19:48   Dg Hip Unilat W Or Wo Pelvis 2-3 Views Left  Result Date: 09/27/2017 CLINICAL DATA:  Fall.  Left hip pain EXAM: DG HIP (WITH OR WITHOUT PELVIS) 2-3V LEFT COMPARISON:  09/17/2016 FINDINGS: Evidence of prior internal fixation in the right hip region. Mild degenerative changes in the hips bilaterally with spurring. No acute fracture, subluxation or dislocation. IMPRESSION: No acute bony abnormality. Electronically Signed   By: Rolm Baptise M.D.   On: 09/27/2017 09:00      Subjective: Patient denies any shortness of breath. Has had good response to enema  Discharge Exam: Vitals:   10/14/17 0527 10/14/17 0752  BP: (!) 114/59   Pulse: 78   Resp:    Temp: 98 F (36.7 C)   SpO2: 98% 95%   Vitals:   10/13/17 2006 10/13/17 2143 10/14/17 0527 10/14/17 0752  BP:  138/69 (!) 114/59   Pulse:  81 78   Resp:      Temp:  98.6 F (37 C) 98 F (36.7 C)   TempSrc:  Oral Oral   SpO2: 94% 99% 98% 95%  Weight:      Height:        General: Pt is alert, awake, not in acute distress Cardiovascular: RRR, S1/S2 +, no rubs, no gallops Respiratory: CTA bilaterally, no wheezing, no rhonchi Abdominal: Soft, NT, ND, bowel sounds + Extremities: no edema, no  cyanosis    The results of significant diagnostics from this hospitalization (including imaging, microbiology, ancillary and laboratory) are listed below for reference.     Microbiology: Recent Results (from the past 240 hour(s))  Urine culture     Status: Abnormal   Collection Time: 10/10/17  2:42 PM  Result Value Ref Range Status   Specimen Description   Final    URINE, CATHETERIZED Performed at [facility], [address] [place], [place] [address]    Special Requests   Final    NONE Performed at [facility], [address], [place], [place] [address]    Culture (A)  Final    >=100,000 COLONIES/mL ENTEROCOCCUS FAECALIS >=100,000 COLONIES/mL AEROCOCCUS URINAE    Report Status 10/14/2017 FINAL  Final   Organism ID, Bacteria ENTEROCOCCUS FAECALIS (A)  Final      Susceptibility   Enterococcus faecalis - MIC*    AMPICILLIN <=2 SENSITIVE Sensitive     LEVOFLOXACIN >=8 RESISTANT  Resistant     NITROFURANTOIN <=16 SENSITIVE Sensitive     VANCOMYCIN 1 SENSITIVE Sensitive     * >=100,000 COLONIES/mL ENTEROCOCCUS FAECALIS  Culture, blood (routine x 2) Call MD if unable to obtain prior to antibiotics being given     Status: None (Preliminary result)   Collection Time: 10/10/17  4:54 PM  Result Value Ref Range Status   Specimen Description LEFT ANTECUBITAL  Final   Special Requests   Final    BOTTLES DRAWN AEROBIC AND ANAEROBIC Blood Culture adequate volume   Culture   Final    NO GROWTH 4 DAYS Performed at [facility], [address], [place], [place] [address]    Report Status PENDING  Incomplete  Culture, blood (routine x 2) Call MD if unable to obtain prior to antibiotics being given     Status: None (Preliminary result)   Collection Time: 10/10/17  4:59 PM  Result Value Ref Range Status   Specimen Description BLOOD RIGHT HAND  Final   Special Requests   Final    BOTTLES DRAWN AEROBIC AND ANAEROBIC Blood Culture adequate volume   Culture   Final    NO GROWTH 4  DAYS Performed at [facility], [address], [place], [place] [address]    Report Status PENDING  Incomplete  MRSA PCR Screening     Status: None   Collection Time: 10/11/17 12:14 AM  Result Value Ref Range Status   MRSA by PCR NEGATIVE NEGATIVE Final    Comment:        The GeneXpert MRSA Assay (FDA approved for NASAL specimens only), is one component of a comprehensive MRSA colonization surveillance program. It is not intended to diagnose MRSA infection nor to guide or monitor treatment for MRSA infections. Performed at [facility], [address] [place], [place] [address]      Labs: BNP (last 3 results) Recent Labs    10/10/17 1416  BNP 67.3   Basic Metabolic Panel: Recent Labs  Lab 10/10/17 1416 10/11/17 0603 10/12/17 0505 10/13/17 0432 10/14/17 0420  NA 143 149* 142 141 139  K 4.2 4.4 4.1 4.4 4.0  CL 111 120* 114* 113* 111  CO2 23 23 23 22  21*  GLUCOSE 89 88 106* 112* 119*  BUN 49* 43* 46* 45* 41*  CREATININE 3.14* 2.96* 3.11* 3.11* 2.85*  CALCIUM 8.4* 8.1* 7.9* 8.0* 8.0*  PHOS  --   --   --   --  3.9   Liver Function Tests: Recent Labs  Lab 10/10/17 1416 10/14/17 0420  AST 13*  --   ALT 8  --   ALKPHOS 65  --   BILITOT 0.5  --   PROT 7.7  --   ALBUMIN 3.3* 2.7*   No results for input(s): LIPASE, AMYLASE in the last 168 hours. No results for input(s): AMMONIA in the last 168 hours. CBC: Recent Labs  Lab 10/10/17 1416 10/11/17 0603 10/12/17 0505 10/14/17 0420  WBC 8.4 6.1 5.7 5.3  NEUTROABS 6.2 4.4  --   --   HGB 10.0* 9.3* 9.1* 9.4*  HCT 33.9* 30.9* 29.7* 29.8*  MCV 98.3 98.1 97.7 94.6  PLT 212 189 177 191   Cardiac Enzymes: Recent Labs  Lab 10/10/17 1416  TROPONINI <0.03   BNP: Invalid input(s): POCBNP CBG: No results for input(s): GLUCAP in the last 168 hours. D-Dimer No results for input(s): DDIMER in the last 72 hours. Hgb A1c No results for input(s): HGBA1C in the last 72 hours.  Lipid Profile No results for  input(s): CHOL, HDL, LDLCALC, TRIG, CHOLHDL, LDLDIRECT in the last 72 hours. Thyroid function studies No results for input(s): TSH, T4TOTAL, T3FREE, THYROIDAB in the last 72 hours.  Invalid input(s): FREET3 Anemia work up Recent Labs    10/12/17 1052  VITAMINB12 375  FOLATE 12.3  FERRITIN 222  TIBC 181*  IRON 50  RETICCTPCT 1.1   Urinalysis    Component Value Date/Time   COLORURINE YELLOW 10/10/2017 1442   APPEARANCEUR HAZY (A) 10/10/2017 1442   LABSPEC 1.008 10/10/2017 1442   PHURINE 5.0 10/10/2017 1442   GLUCOSEU NEGATIVE 10/10/2017 1442   HGBUR SMALL (A) 10/10/2017 1442   BILIRUBINUR NEGATIVE 10/10/2017 1442   KETONESUR NEGATIVE 10/10/2017 1442   PROTEINUR NEGATIVE 10/10/2017 1442   UROBILINOGEN 1.0 12/23/2014 1853   NITRITE NEGATIVE 10/10/2017 1442   LEUKOCYTESUR LARGE (A) 10/10/2017 1442   Sepsis Labs Invalid input(s): PROCALCITONIN,  WBC,  LACTICIDVEN Microbiology Recent Results (from the past 240 hour(s))  Urine culture     Status: Abnormal   Collection Time: 10/10/17  2:42 PM  Result Value Ref Range Status   Specimen Description   Final    URINE, CATHETERIZED Performed at [facility], [address], [place], [place] [address]    Special Requests   Final    NONE Performed at [facility], [address] [place], [place] [address]    Culture (A)  Final    >=100,000 COLONIES/mL ENTEROCOCCUS FAECALIS >=100,000 COLONIES/mL AEROCOCCUS URINAE    Report Status 10/14/2017 FINAL  Final   Organism ID, Bacteria ENTEROCOCCUS FAECALIS (A)  Final      Susceptibility   Enterococcus faecalis - MIC*    AMPICILLIN <=2 SENSITIVE Sensitive     LEVOFLOXACIN >=8 RESISTANT Resistant     NITROFURANTOIN <=16 SENSITIVE Sensitive     VANCOMYCIN 1 SENSITIVE Sensitive     * >=100,000 COLONIES/mL ENTEROCOCCUS FAECALIS  Culture, blood (routine x 2) Call MD if unable to obtain prior to antibiotics being given     Status: None (Preliminary result)   Collection Time: 10/10/17  4:54  PM  Result Value Ref Range Status   Specimen Description LEFT ANTECUBITAL  Final   Special Requests   Final    BOTTLES DRAWN AEROBIC AND ANAEROBIC Blood Culture adequate volume   Culture   Final    NO GROWTH 4 DAYS Performed at [facility], [address], [place], [place] [address]    Report Status PENDING  Incomplete  Culture, blood (routine x 2) Call MD if unable to obtain prior to antibiotics being given     Status: None (Preliminary result)   Collection Time: 10/10/17  4:59 PM  Result Value Ref Range Status   Specimen Description BLOOD RIGHT HAND  Final   Special Requests   Final    BOTTLES DRAWN AEROBIC AND ANAEROBIC Blood Culture adequate volume   Culture   Final    NO GROWTH 4 DAYS Performed at [facility], [address] [place], [place] [address]    Report Status PENDING  Incomplete  MRSA PCR Screening     Status: None   Collection Time: 10/11/17 12:14 AM  Result Value Ref Range Status   MRSA by PCR NEGATIVE NEGATIVE Final    Comment:        The GeneXpert MRSA Assay (FDA approved for NASAL specimens only), is one component of a comprehensive MRSA colonization surveillance program. It is not intended to diagnose MRSA infection nor to guide or monitor  treatment for MRSA infections. Performed at [facility], [address], [place], [place] [address]      Time coordinating discharge: 34mins  SIGNED:   Kathie Dike, MD  [facility] 10/14/2017, 11:27 AM Pager   If 7PM-7AM, please contact night-coverage [email or web address] Password [email or web address]

## 2017-10-14 NOTE — NC FL2 (Signed)
[place] MEDICAID FL2 LEVEL OF CARE SCREENING TOOL     IDENTIFICATION  Patient Name: Anthony [name] Birthdate: December 13, 1940 Sex: male Admission Date (Current Location): 10/10/2017  [facility] and [place] Number:  [employer] and Address:  [place] [address] [place]      Provider Number: [id number]  Attending Physician Name and Address:  Kathie Dike, MD  Relative Name and Phone Number:       Current Level of Care: Hospital Recommended Level of Care: [place] Prior Approval Number:    Date Approved/Denied:   PASRR Number:    Discharge Plan: Other (Comment)(ALF)    Current Diagnoses: Patient Active Problem List   Diagnosis Date Noted  . Syncope and collapse 10/10/2017  . Acute lower UTI 10/10/2017  . AKI (acute kidney injury) ([place])   . Hematemesis 07/06/2017  . Normochromic normocytic anemia 06/19/2017  . CKD (chronic kidney disease) stage 3, GFR 30-59 ml/min (HCC) 06/19/2017  . Dementia 06/19/2017  . Decubitus ulcer 06/19/2017  . Open wnd of scalp   . S/p left hip fracture   . HCAP (healthcare-associated pneumonia) 10/31/2016  . CKD (chronic kidney disease) 10/31/2016  . COPD (chronic obstructive pulmonary disease) ([place]) 10/31/2016  . Hyponatremia 12/25/2014  . Hypokalemia 12/25/2014  . Normocytic anemia 12/25/2014  . Pulmonary nodules 12/25/2014  . Tobacco abuse 12/25/2014  . ARF (acute renal failure) ([place]) 12/24/2014  . Acute pyelonephritis 12/23/2014    Orientation RESPIRATION BLADDER Height & Weight     Self, Place  Normal Indwelling catheter Weight: 191 lb 9.3 oz (86.9 kg) Height:  6\' 4"  (193 cm)  BEHAVIORAL SYMPTOMS/MOOD NEUROLOGICAL BOWEL NUTRITION STATUS      Incontinent Diet(Heart Healthy (which facility states equates to their regular diet).)  AMBULATORY STATUS COMMUNICATION OF NEEDS Skin   Total Care(uses wheelchair ) Verbally Normal                       Personal Care Assistance  Level of Assistance  Bathing, Feeding, Dressing Bathing Assistance: Limited assistance Feeding assistance: Independent Dressing Assistance: Limited assistance     Functional Limitations Info  Sight, Hearing, Speech Sight Info: Adequate Hearing Info: Adequate Speech Info: Adequate    SPECIAL CARE FACTORS FREQUENCY  PT (By licensed PT)     PT Frequency: 3x/week              Contractures Contractures Info: Not present    Additional Factors Info  Code Status, Allergies, Psychotropic Code Status Info: Full Code Allergies Info: Bee Venom, Penicillins, Strawberry Extract,  Psychotropic Info: Wellbutrin, Zoloft, Desyrel (Desyrel)         Current Medications (10/14/2017):  This is the current hospital active medication list Current Facility-Administered Medications  Medication Dose Route Frequency Provider Last Rate Last Dose  . acetaminophen (TYLENOL) tablet 650 mg  650 mg Oral Q6H PRN Lovey Newcomer T, NP   650 mg at 10/12/17 2256  . albuterol (PROVENTIL) (2.5 MG/3ML) 0.083% nebulizer solution 2.5 mg  2.5 mg Nebulization Q4H PRN Phillips Grout, MD      . aspirin EC tablet 81 mg  81 mg Oral Daily Derrill Kay A, MD   81 mg at 10/14/17 0917  . buPROPion (WELLBUTRIN SR) 12 hr tablet 150 mg  150 mg Oral BID Kathie Dike, MD   150 mg at 10/14/17 0916  . dextrose 5 % solution   Intravenous Continuous Fran Lowes, MD 135 mL/hr at 10/14/17 0928    . [  START ON 10/16/2017] fosfomycin (MONUROL) packet 3 g  3 g Oral Once Kathie Dike, MD      . gabapentin (NEURONTIN) capsule 400 mg  400 mg Oral TID Phillips Grout, MD   400 mg at 10/14/17 0917  . heparin injection 5,000 Units  5,000 Units Subcutaneous Q8H Kathie Dike, MD   5,000 Units at 10/14/17 0615  . ipratropium-albuterol (DUONEB) 0.5-2.5 (3) MG/3ML nebulizer solution 3 mL  3 mL Nebulization TID Kathie Dike, MD   3 mL at 10/14/17 0750  . memantine (NAMENDA) tablet 10 mg  10 mg Oral BID Kathie Dike, MD   10 mg at  10/14/17 0916  . mometasone-formoterol (DULERA) 200-5 MCG/ACT inhaler 2 puff  2 puff Inhalation BID Kathie Dike, MD   2 puff at 10/14/17 0750  . pantoprazole (PROTONIX) EC tablet 40 mg  40 mg Oral BID AC Phillips Grout, MD   40 mg at 10/14/17 0759  . pramipexole (MIRAPEX) tablet 0.125 mg  0.125 mg Oral QHS Kathie Dike, MD   0.125 mg at 10/13/17 2315  . sertraline (ZOLOFT) tablet 50 mg  50 mg Oral Daily Kathie Dike, MD   50 mg at 10/14/17 0917  . tamsulosin (FLOMAX) capsule 0.4 mg  0.4 mg Oral QHS Kathie Dike, MD   0.4 mg at 10/13/17 2313  . traZODone (DESYREL) tablet 200 mg  200 mg Oral QHS Kathie Dike, MD   200 mg at 10/13/17 2313     Discharge Medications: Medication List    STOP taking these medications   cephALEXin 500 MG capsule Commonly known as:  KEFLEX   neomycin-bacitracin-polymyxin ointment Commonly known as:  NEOSPORIN     TAKE these medications   acetaminophen 500 MG tablet Commonly known as:  TYLENOL Take 500 mg by mouth 3 (three) times daily as needed for mild pain.   albuterol (2.5 MG/3ML) 0.083% nebulizer solution Commonly known as:  PROVENTIL Take 3 mLs (2.5 mg total) by nebulization every 4 (four) hours as needed for wheezing or shortness of breath. What changed:  when to take this   albuterol 108 (90 Base) MCG/ACT inhaler Commonly known as:  PROVENTIL HFA;VENTOLIN HFA Inhale 2 puffs into the lungs every 4 (four) hours as needed for wheezing or shortness of breath. What changed:  Another medication with the same name was changed. Make sure you understand how and when to take each.   aspirin EC 81 MG tablet Take 81 mg by mouth daily.   baclofen 10 MG tablet Commonly known as:  LIORESAL Take 10 mg by mouth 2 (two) times daily.   buPROPion 150 MG 12 hr tablet Commonly known as:  WELLBUTRIN SR Take 150 mg by mouth 2 (two) times daily.   CALCIUM CITRATE+D3 PETITES 200-250 MG-UNIT Tabs Generic drug:  Calcium Citrate-Vitamin  D Take 1 tablet by mouth daily.   cholecalciferol 1000 units tablet Commonly known as:  VITAMIN D Take 2,000 Units by mouth daily.   fluticasone 50 MCG/ACT nasal spray Commonly known as:  FLONASE Place 1 spray 2 (two) times daily into both nostrils.   Fluticasone-Salmeterol 250-50 MCG/DOSE Aepb Commonly known as:  ADVAIR Inhale 1 puff 2 (two) times daily into the lungs.   fosfomycin 3 g Pack Commonly known as:  MONUROL Take 3 g by mouth once for 1 dose. On 10/16/17 Start taking on:  10/16/2017   gabapentin 400 MG capsule Commonly known as:  NEURONTIN Take 400 mg by mouth 3 (three) times daily.   ipratropium 0.02 %  nebulizer solution Commonly known as:  ATROVENT Take 2.5 mLs (0.5 mg total) by nebulization every 6 (six) hours as needed for wheezing or shortness of breath. What changed:  when to take this   memantine 5 MG tablet Commonly known as:  NAMENDA Take 1 tablet (5 mg total) by mouth 2 (two) times daily. What changed:    medication strength  how much to take   pantoprazole 40 MG tablet Commonly known as:  PROTONIX Take 1 tablet (40 mg total) by mouth 2 (two) times daily before a meal.   polyethylene glycol packet Commonly known as:  MIRALAX Take 17 g by mouth daily.   pramipexole 0.125 MG tablet Commonly known as:  MIRAPEX Take 0.125 mg by mouth at bedtime.   sennosides-docusate sodium 8.6-50 MG tablet Commonly known as:  SENOKOT-S Take 1 tablet by mouth daily as needed for constipation.   sertraline 50 MG tablet Commonly known as:  ZOLOFT Take 50 mg by mouth daily.   tamsulosin 0.4 MG Caps capsule Commonly known as:  FLOMAX Take 0.4 mg by mouth daily.   traMADol 50 MG tablet Commonly known as:  ULTRAM Take 1 tablet (50 mg total) by mouth every 6 (six) hours as needed for severe pain. What changed:  when to take this   traZODone 100 MG tablet Commonly known as:  DESYREL Take 200 mg by mouth at bedtime.   triamcinolone ointment  0.5 % Commonly known as:  KENALOG Apply 1 application topically 2 (two) times daily. Apply to affected areas of forearms twice daily.   vitamin B-12 1000 MCG tablet Commonly known as:  CYANOCOBALAMIN Take 1,000 mcg by mouth daily.   Vitamin D (Ergocalciferol) 50000 units Caps capsule Commonly known as:  DRISDOL Take 50,000 Units by mouth every Friday.       Relevant Imaging Results:  Relevant Lab Results:   Additional Information    Jossalin Chervenak, Clydene Pugh, LCSW

## 2017-10-14 NOTE — Care Management Important Message (Signed)
Important Message  Patient Details  Name: Anthony [name] MRN: [id number] Date of Birth: 11-18-1940   Medicare Important Message Given:  Yes    [name], [name] 10/14/2017, 12:37 PM

## 2017-10-14 NOTE — NC FL2 (Deleted)
[place] MEDICAID FL2 LEVEL OF CARE SCREENING TOOL     IDENTIFICATION  Patient Name: Anthony [name] Birthdate: February 02, 1941 Sex: male Admission Date (Current Location): 10/10/2017  [facility] and [place] Number:  [employer] and Address:  [place] [address], [place]      Provider Number: [id number]  Attending Physician Name and Address:  Kathie Dike, MD  Relative Name and Phone Number:       Current Level of Care: Hospital Recommended Level of Care: [place] Prior Approval Number:    Date Approved/Denied:   PASRR Number:    Discharge Plan: Other (Comment)(ALF)    Current Diagnoses: Patient Active Problem List   Diagnosis Date Noted  . Syncope and collapse 10/10/2017  . Acute lower UTI 10/10/2017  . AKI (acute kidney injury) ([place])   . Hematemesis 07/06/2017  . Normochromic normocytic anemia 06/19/2017  . CKD (chronic kidney disease) stage 3, GFR 30-59 ml/min (HCC) 06/19/2017  . Dementia 06/19/2017  . Decubitus ulcer 06/19/2017  . Open wnd of scalp   . S/p left hip fracture   . HCAP (healthcare-associated pneumonia) 10/31/2016  . CKD (chronic kidney disease) 10/31/2016  . COPD (chronic obstructive pulmonary disease) ([place]) 10/31/2016  . Hyponatremia 12/25/2014  . Hypokalemia 12/25/2014  . Normocytic anemia 12/25/2014  . Pulmonary nodules 12/25/2014  . Tobacco abuse 12/25/2014  . ARF (acute renal failure) ([place]) 12/24/2014  . Acute pyelonephritis 12/23/2014    Orientation RESPIRATION BLADDER Height & Weight     Self, Place  Normal Indwelling catheter Weight: 191 lb 9.3 oz (86.9 kg) Height:  6\' 4"  (193 cm)  BEHAVIORAL SYMPTOMS/MOOD NEUROLOGICAL BOWEL NUTRITION STATUS      Incontinent Diet(Heart Healthy (which facility states equates to their regular diet).)  AMBULATORY STATUS COMMUNICATION OF NEEDS Skin   Total Care(uses wheelchair ) Verbally Normal                       Personal Care Assistance  Level of Assistance  Bathing, Feeding, Dressing Bathing Assistance: Limited assistance Feeding assistance: Independent Dressing Assistance: Limited assistance     Functional Limitations Info  Sight, Hearing, Speech Sight Info: Adequate Hearing Info: Adequate Speech Info: Adequate    SPECIAL CARE FACTORS FREQUENCY  PT (By licensed PT)     PT Frequency: 3x/week              Contractures Contractures Info: Not present    Additional Factors Info  Code Status, Allergies, Psychotropic Code Status Info: Full Code Allergies Info: Bee Venom, Penicillins, Strawberry Extract,  Psychotropic Info: Wellbutrin, Zoloft, Desyrel (Desyrel)         Current Medications (10/14/2017):  This is the current hospital active medication list Current Facility-Administered Medications  Medication Dose Route Frequency Provider Last Rate Last Dose  . acetaminophen (TYLENOL) tablet 650 mg  650 mg Oral Q6H PRN Lovey Newcomer T, NP   650 mg at 10/12/17 2256  . albuterol (PROVENTIL) (2.5 MG/3ML) 0.083% nebulizer solution 2.5 mg  2.5 mg Nebulization Q4H PRN Phillips Grout, MD      . aspirin EC tablet 81 mg  81 mg Oral Daily Derrill Kay A, MD   81 mg at 10/14/17 0917  . buPROPion (WELLBUTRIN SR) 12 hr tablet 150 mg  150 mg Oral BID Kathie Dike, MD   150 mg at 10/14/17 0916  . dextrose 5 % solution   Intravenous Continuous Fran Lowes, MD 135 mL/hr at 10/14/17 0928    . [  START ON 10/16/2017] fosfomycin (MONUROL) packet 3 g  3 g Oral Once Kathie Dike, MD      . gabapentin (NEURONTIN) capsule 400 mg  400 mg Oral TID Phillips Grout, MD   400 mg at 10/14/17 0917  . heparin injection 5,000 Units  5,000 Units Subcutaneous Q8H Kathie Dike, MD   5,000 Units at 10/14/17 0615  . ipratropium-albuterol (DUONEB) 0.5-2.5 (3) MG/3ML nebulizer solution 3 mL  3 mL Nebulization TID Kathie Dike, MD   3 mL at 10/14/17 0750  . memantine (NAMENDA) tablet 10 mg  10 mg Oral BID Kathie Dike, MD   10 mg at  10/14/17 0916  . mometasone-formoterol (DULERA) 200-5 MCG/ACT inhaler 2 puff  2 puff Inhalation BID Kathie Dike, MD   2 puff at 10/14/17 0750  . pantoprazole (PROTONIX) EC tablet 40 mg  40 mg Oral BID AC Phillips Grout, MD   40 mg at 10/14/17 0759  . pramipexole (MIRAPEX) tablet 0.125 mg  0.125 mg Oral QHS Kathie Dike, MD   0.125 mg at 10/13/17 2315  . sertraline (ZOLOFT) tablet 50 mg  50 mg Oral Daily Kathie Dike, MD   50 mg at 10/14/17 0917  . tamsulosin (FLOMAX) capsule 0.4 mg  0.4 mg Oral QHS Kathie Dike, MD   0.4 mg at 10/13/17 2313  . traZODone (DESYREL) tablet 200 mg  200 mg Oral QHS Kathie Dike, MD   200 mg at 10/13/17 2313     Discharge Medications: Medication List    STOP taking these medications   cephALEXin 500 MG capsule Commonly known as:  KEFLEX   neomycin-bacitracin-polymyxin ointment Commonly known as:  NEOSPORIN     TAKE these medications   acetaminophen 500 MG tablet Commonly known as:  TYLENOL Take 500 mg by mouth 3 (three) times daily as needed for mild pain.   albuterol (2.5 MG/3ML) 0.083% nebulizer solution Commonly known as:  PROVENTIL Take 3 mLs (2.5 mg total) by nebulization every 4 (four) hours as needed for wheezing or shortness of breath. What changed:  when to take this   albuterol 108 (90 Base) MCG/ACT inhaler Commonly known as:  PROVENTIL HFA;VENTOLIN HFA Inhale 2 puffs into the lungs every 4 (four) hours as needed for wheezing or shortness of breath. What changed:  Another medication with the same name was changed. Make sure you understand how and when to take each.   aspirin EC 81 MG tablet Take 81 mg by mouth daily.   baclofen 10 MG tablet Commonly known as:  LIORESAL Take 10 mg by mouth 2 (two) times daily.   buPROPion 150 MG 12 hr tablet Commonly known as:  WELLBUTRIN SR Take 150 mg by mouth 2 (two) times daily.   CALCIUM CITRATE+D3 PETITES 200-250 MG-UNIT Tabs Generic drug:  Calcium Citrate-Vitamin  D Take 1 tablet by mouth daily.   cholecalciferol 1000 units tablet Commonly known as:  VITAMIN D Take 2,000 Units by mouth daily.   fluticasone 50 MCG/ACT nasal spray Commonly known as:  FLONASE Place 1 spray 2 (two) times daily into both nostrils.   Fluticasone-Salmeterol 250-50 MCG/DOSE Aepb Commonly known as:  ADVAIR Inhale 1 puff 2 (two) times daily into the lungs.   fosfomycin 3 g Pack Commonly known as:  MONUROL Take 3 g by mouth once for 1 dose. On 10/16/17 Start taking on:  10/16/2017   gabapentin 400 MG capsule Commonly known as:  NEURONTIN Take 400 mg by mouth 3 (three) times daily.   ipratropium 0.02 %  nebulizer solution Commonly known as:  ATROVENT Take 2.5 mLs (0.5 mg total) by nebulization every 6 (six) hours as needed for wheezing or shortness of breath. What changed:  when to take this   memantine 5 MG tablet Commonly known as:  NAMENDA Take 1 tablet (5 mg total) by mouth 2 (two) times daily. What changed:    medication strength  how much to take   pantoprazole 40 MG tablet Commonly known as:  PROTONIX Take 1 tablet (40 mg total) by mouth 2 (two) times daily before a meal.   polyethylene glycol packet Commonly known as:  MIRALAX Take 17 g by mouth daily.   pramipexole 0.125 MG tablet Commonly known as:  MIRAPEX Take 0.125 mg by mouth at bedtime.   sennosides-docusate sodium 8.6-50 MG tablet Commonly known as:  SENOKOT-S Take 1 tablet by mouth daily as needed for constipation.   sertraline 50 MG tablet Commonly known as:  ZOLOFT Take 50 mg by mouth daily.   tamsulosin 0.4 MG Caps capsule Commonly known as:  FLOMAX Take 0.4 mg by mouth daily.   traMADol 50 MG tablet Commonly known as:  ULTRAM Take 1 tablet (50 mg total) by mouth every 6 (six) hours as needed for severe pain. What changed:  when to take this   traZODone 100 MG tablet Commonly known as:  DESYREL Take 200 mg by mouth at bedtime.   triamcinolone ointment  0.5 % Commonly known as:  KENALOG Apply 1 application topically 2 (two) times daily. Apply to affected areas of forearms twice daily.   vitamin B-12 1000 MCG tablet Commonly known as:  CYANOCOBALAMIN Take 1,000 mcg by mouth daily.   Vitamin D (Ergocalciferol) 50000 units Caps capsule Commonly known as:  DRISDOL Take 50,000 Units by mouth every Friday.        Relevant Imaging Results:  Relevant Lab Results:   Additional Information    Elsye Mccollister, Clydene Pugh, LCSW

## 2017-10-14 NOTE — Progress Notes (Signed)
IV removed, 2x2 gauze and paper tape applied to site, patient tolerated well.  Called report to staff at [facility], staff from [facility] to pick up patient at 1 pm.  Left voicemail with Betsey Amen, legal guardian regarding patient being discharged from [facility] and going back to [facility].

## 2017-10-15 LAB — CULTURE, BLOOD (ROUTINE X 2)
CULTURE: NO GROWTH
Culture: NO GROWTH
Special Requests: ADEQUATE
Special Requests: ADEQUATE

## 2017-10-17 NOTE — Progress Notes (Addendum)
Addendum:Patient's insurance did not cover Monurol.  Pharmacy called PCP office who ordered Bactrim, but this would not cover Enterococcus.  Spoke with Pharmacy & ordered Ampicillin for 5 days.  Patient however has an ampicillin allergy, so instead macrodantin for 5 days ordered.

## 2017-10-18 ENCOUNTER — Ambulatory Visit: Payer: Medicare Other | Admitting: Urology

## 2017-10-18 DIAGNOSIS — L821 Other seborrheic keratosis: Secondary | ICD-10-CM | POA: Diagnosis not present

## 2017-10-18 DIAGNOSIS — Z85828 Personal history of other malignant neoplasm of skin: Secondary | ICD-10-CM | POA: Diagnosis not present

## 2017-10-20 ENCOUNTER — Encounter (HOSPITAL_COMMUNITY): Payer: Self-pay | Admitting: Emergency Medicine

## 2017-10-20 ENCOUNTER — Other Ambulatory Visit: Payer: Self-pay

## 2017-10-20 ENCOUNTER — Emergency Department (HOSPITAL_COMMUNITY): Payer: Medicare Other

## 2017-10-20 ENCOUNTER — Emergency Department (HOSPITAL_COMMUNITY)
Admission: EM | Admit: 2017-10-20 | Discharge: 2017-10-20 | Disposition: A | Discharge status: Skilled Nursing Facility | Payer: Medicare Other | Attending: Emergency Medicine | Admitting: Emergency Medicine

## 2017-10-20 DIAGNOSIS — W19XXXA Unspecified fall, initial encounter: Secondary | ICD-10-CM

## 2017-10-20 DIAGNOSIS — S199XXA Unspecified injury of neck, initial encounter: Secondary | ICD-10-CM | POA: Diagnosis not present

## 2017-10-20 DIAGNOSIS — N183 Chronic kidney disease, stage 3 (moderate): Secondary | ICD-10-CM | POA: Insufficient documentation

## 2017-10-20 DIAGNOSIS — S0990XA Unspecified injury of head, initial encounter: Secondary | ICD-10-CM | POA: Diagnosis not present

## 2017-10-20 DIAGNOSIS — S4991XA Unspecified injury of right shoulder and upper arm, initial encounter: Secondary | ICD-10-CM | POA: Diagnosis not present

## 2017-10-20 DIAGNOSIS — Z7982 Long term (current) use of aspirin: Secondary | ICD-10-CM | POA: Insufficient documentation

## 2017-10-20 DIAGNOSIS — Z87891 Personal history of nicotine dependence: Secondary | ICD-10-CM | POA: Insufficient documentation

## 2017-10-20 DIAGNOSIS — Z79899 Other long term (current) drug therapy: Secondary | ICD-10-CM | POA: Insufficient documentation

## 2017-10-20 DIAGNOSIS — F0390 Unspecified dementia without behavioral disturbance: Secondary | ICD-10-CM | POA: Diagnosis not present

## 2017-10-20 DIAGNOSIS — M25511 Pain in right shoulder: Secondary | ICD-10-CM | POA: Insufficient documentation

## 2017-10-20 DIAGNOSIS — M542 Cervicalgia: Secondary | ICD-10-CM | POA: Diagnosis not present

## 2017-10-20 DIAGNOSIS — R51 Headache: Secondary | ICD-10-CM | POA: Diagnosis not present

## 2017-10-20 DIAGNOSIS — J449 Chronic obstructive pulmonary disease, unspecified: Secondary | ICD-10-CM | POA: Insufficient documentation

## 2017-10-20 NOTE — ED Triage Notes (Signed)
Pt in shower at [facility] and fell hitting right shoulder. Pt has history of dementia.

## 2017-10-20 NOTE — ED Provider Notes (Signed)
[facility] EMERGENCY DEPARTMENT Provider Note   CSN: [id number] Arrival date & time: 10/20/17  1111     History   Chief Complaint Chief Complaint  Patient presents with  . Fall    HPI Anthony [name] is a 77 y.o. male.  Level 5 caveat secondary to dementia.  Patient was sent in from nursing facility after falling in the shower and hitting his right shoulder.  Patient does not recall falling and denies any complaints.  He is unable to provide any other history.  On review of his notes he is not on any anticoagulation.  The history is provided by the patient and the EMS personnel. The history is limited by the condition of the patient.  Fall  This is a new problem. The current episode started 1 to 2 hours ago. Pertinent negatives include no chest pain, no abdominal pain, no headaches and no shortness of breath. Nothing aggravates the symptoms. Nothing relieves the symptoms. He has tried nothing for the symptoms. The treatment provided no relief.    Past Medical History:  Diagnosis Date  . Acute pyelonephritis 12/23/2014  . Asthma   . BPH (benign prostatic hyperplasia)   . Chronic back pain   . Chronic hip pain   . CKD (chronic kidney disease)   . COPD (chronic obstructive pulmonary disease) ([place])   . Dementia   . Depression   . GERD (gastroesophageal reflux disease)   . Kidney stone   . Neuropathy   . Pulmonary nodules 12/25/2014  . Restless leg syndrome     Patient Active Problem List   Diagnosis Date Noted  . Syncope and collapse 10/10/2017  . Acute lower UTI 10/10/2017  . AKI (acute kidney injury) ([place])   . Hematemesis 07/06/2017  . Normochromic normocytic anemia 06/19/2017  . CKD (chronic kidney disease) stage 3, GFR 30-59 ml/min (HCC) 06/19/2017  . Dementia 06/19/2017  . Decubitus ulcer 06/19/2017  . Open wnd of scalp   . S/p left hip fracture   . HCAP (healthcare-associated pneumonia) 10/31/2016  . CKD (chronic kidney disease) 10/31/2016  . COPD (chronic  obstructive pulmonary disease) ([place]) 10/31/2016  . Hyponatremia 12/25/2014  . Hypokalemia 12/25/2014  . Normocytic anemia 12/25/2014  . Pulmonary nodules 12/25/2014  . Tobacco abuse 12/25/2014  . ARF (acute renal failure) ([place]) 12/24/2014  . Acute pyelonephritis 12/23/2014    Past Surgical History:  Procedure Laterality Date  . ABDOMINAL SURGERY     heria  . ESOPHAGOGASTRODUODENOSCOPY N/A 07/06/2017   Procedure: ESOPHAGOGASTRODUODENOSCOPY (EGD);  Surgeon: Rogene Houston, MD;  Location: [facility];  Service: Endoscopy;  Laterality: N/A;  . EYE SURGERY          Home Medications    Prior to Admission medications   Medication Sig Start Date End Date Taking? Authorizing Provider  acetaminophen (TYLENOL) 500 MG tablet Take 500 mg by mouth 3 (three) times daily as needed for mild pain.     [provider]  albuterol (PROVENTIL HFA;VENTOLIN HFA) 108 (90 BASE) MCG/ACT inhaler Inhale 2 puffs into the lungs every 4 (four) hours as needed for wheezing or shortness of breath.    [provider]  albuterol (PROVENTIL) (2.5 MG/3ML) 0.083% nebulizer solution Take 3 mLs (2.5 mg total) by nebulization every 4 (four) hours as needed for wheezing or shortness of breath. Patient taking differently: Take 2.5 mg by nebulization 4 (four) times daily.  07/07/17   Johnson, Clanford L, MD  aspirin EC 81 MG tablet Take 81 mg by  mouth daily.    [provider]  baclofen (LIORESAL) 10 MG tablet Take 10 mg by mouth 2 (two) times daily.    [provider]  buPROPion (WELLBUTRIN SR) 150 MG 12 hr tablet Take 150 mg by mouth 2 (two) times daily.    [provider]  Calcium Citrate-Vitamin D (CALCIUM CITRATE+D3 PETITES) 200-250 MG-UNIT TABS Take 1 tablet by mouth daily.    [provider]  cholecalciferol (VITAMIN D) 1000 units tablet Take 2,000 Units by mouth daily.    [provider]  fluticasone (FLONASE) 50 MCG/ACT nasal spray Place 1 spray 2 (two)  times daily into both nostrils.     [provider]  Fluticasone-Salmeterol (ADVAIR) 250-50 MCG/DOSE AEPB Inhale 1 puff 2 (two) times daily into the lungs.    [provider]  gabapentin (NEURONTIN) 400 MG capsule Take 400 mg by mouth 3 (three) times daily.    [provider]  ipratropium (ATROVENT) 0.02 % nebulizer solution Take 2.5 mLs (0.5 mg total) by nebulization every 6 (six) hours as needed for wheezing or shortness of breath. Patient taking differently: Take 0.5 mg by nebulization 4 (four) times daily.  07/07/17   Johnson, Clanford L, MD  memantine (NAMENDA) 5 MG tablet Take 1 tablet (5 mg total) by mouth 2 (two) times daily. 10/14/17   Kathie Dike, MD  pantoprazole (PROTONIX) 40 MG tablet Take 1 tablet (40 mg total) by mouth 2 (two) times daily before a meal. 07/07/17   Johnson, Clanford L, MD  polyethylene glycol (MIRALAX) packet Take 17 g by mouth daily. 10/14/17   Kathie Dike, MD  pramipexole (MIRAPEX) 0.125 MG tablet Take 0.125 mg by mouth at bedtime.    [provider]  sennosides-docusate sodium (SENOKOT-S) 8.6-50 MG tablet Take 1 tablet by mouth daily as needed for constipation.    [provider]  sertraline (ZOLOFT) 50 MG tablet Take 50 mg by mouth daily.    [provider]  tamsulosin (FLOMAX) 0.4 MG CAPS capsule Take 0.4 mg by mouth daily.     [provider]  traMADol (ULTRAM) 50 MG tablet Take 1 tablet (50 mg total) by mouth every 6 (six) hours as needed for severe pain. Patient taking differently: Take 50 mg by mouth 2 (two) times daily.  07/07/17   Johnson, Clanford L, MD  traZODone (DESYREL) 100 MG tablet Take 200 mg by mouth at bedtime.    [provider]  triamcinolone ointment (KENALOG) 0.5 % Apply 1 application topically 2 (two) times daily. Apply to affected areas of forearms twice daily.    [provider]  vitamin B-12 (CYANOCOBALAMIN) 1000 MCG tablet Take 1,000 mcg by mouth daily.     [provider]  Vitamin D, Ergocalciferol, (DRISDOL) 50000 units CAPS capsule Take 50,000 Units by mouth every Friday.     [provider]    Family History Family History  Family history unknown: Yes    Social History Social History   Tobacco Use  . Smoking status: Former Smoker    Packs/day: 0.50    Types: Cigarettes    Last attempt to quit: 06/09/2017    Years since quitting: 0.3  . Smokeless tobacco: Never Used  Substance Use Topics  . Alcohol use: No  . Drug use: No     Allergies   Bee venom; Penicillins; and Strawberry extract   Review of Systems Review of Systems  Unable to perform ROS: Dementia  Respiratory: Negative for shortness of breath.  Cardiovascular: Negative for chest pain.  Gastrointestinal: Negative for abdominal pain.  Neurological: Negative for headaches.     Physical Exam Updated Vital Signs BP 131/62 (BP Location: Left Arm)   Pulse 64   Temp 97.6 F (36.4 C) (Oral)   Resp 18   Ht 6\' 4"  (1.93 m)   Wt 86 kg   SpO2 93%   BMI 23.08 kg/m   Physical Exam  Constitutional: He appears well-developed and well-nourished.  HENT:  Head: Normocephalic and atraumatic.  Eyes: Conjunctivae are normal.  Neck: Neck supple.  Cardiovascular: Normal rate, regular rhythm, normal heart sounds and intact distal pulses.  Pulmonary/Chest: Effort normal and breath sounds normal. He has no wheezes. He has no rales.  Abdominal: Soft. He exhibits no mass. There is no tenderness. There is no guarding.  Musculoskeletal: He exhibits no deformity.  Patient is range of motion of all extremities although has some pain with ranging his right shoulder.  He has normal landmarks.  There is tender palpation.  He is got various areas of ecchymoses over his arms that appear from multiple different times.  Neurological: He is alert. He is disoriented (To time and place). GCS eye subscore is 4. GCS verbal subscore is 5. GCS motor subscore is 6.  Skin: Skin  is warm and dry.  Psychiatric: He has a normal mood and affect.  Nursing note and vitals reviewed.    ED Treatments / Results  Labs (all labs ordered are listed, but only abnormal results are displayed) Labs Reviewed - No data to display  EKG None  Radiology Dg Shoulder Right  Result Date: 10/20/2017 CLINICAL DATA:  Fall in shower at nursing facility with right shoulder injury. Dementia EXAM: RIGHT SHOULDER - 2+ VIEW COMPARISON:  12/24/2016 FINDINGS: [place] and sclerotic lesion in the proximal right humerus characterized by MRI in 2018. No evidence of progression or degeneration. Osteopenia. No fracture or dislocation. Acromioclavicular degenerative spurring. IMPRESSION: 1. No acute finding. 2. 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   On: 10/20/2017 12:17   Ct Head Wo Contrast  Result Date: 10/20/2017 CLINICAL DATA:  Pain following fall EXAM: CT HEAD WITHOUT CONTRAST CT CERVICAL SPINE WITHOUT CONTRAST TECHNIQUE: Multidetector CT imaging of the head and cervical spine was performed following the standard protocol without intravenous contrast. Multiplanar CT image reconstructions of the cervical spine were also generated. COMPARISON:  CT head and CT cervical spine October 10, 2017 FINDINGS: CT HEAD FINDINGS Brain: Moderate diffuse atrophy is stable. There is no evident intracranial mass, hemorrhage, extra-axial fluid collection, or midline shift. There is slight small vessel disease in the centra semiovale bilaterally. Elsewhere gray-white compartments are normal. No acute infarct evident. Vascular: No hyperdense vessel. There is calcification in each cavernous carotid artery region. Skull: The bony calvarium appears intact. Sinuses/Orbits: There is a retention cyst in the mid left ethmoid air cell region. There is mucosal thickening in several ethmoid air cells. Orbits appear symmetric bilaterally with the exception of previous cataract  removal on the left. Other: Mastoid air cells are clear. There is debris in each external auditory canal. CT CERVICAL SPINE FINDINGS Alignment: There is 2 mm of anterolisthesis of C7 on T1, stable. No other spondylolisthesis is evident. Skull base and vertebrae: The skull base and craniocervical junction regions appear normal. There is mild pannus posterior to the odontoid, not causing appreciable impression on the craniocervical junction. No fracture is demonstrable. There are no blastic or lytic  bone lesions. Soft tissues and spinal canal: Prevertebral soft tissues and predental space regions are normal. There is no paraspinous lesion. No cord or canal hematoma evident. Disc levels: There is marked disc space narrowing at C5-6 and C6-7. there is multilevel facet arthropathy. There is impression on the exiting nerve root on the left at C3-4, on the left at C4-5, at C5-6 bilaterally, and C6-7 bilaterally due to bony hypertrophy. There is no frank disc extrusion. There is borderline stenosis at C5-6. Upper chest: There is scarring in the lung apices with several small bullae in the right apex. Other: There is calcification in each carotid artery. IMPRESSION: CT head: Atrophy with slight periventricular small vessel disease. No evident acute infarct. No mass or hemorrhage. There are foci of arterial vascular calcification. There are areas of ethmoid sinus disease. There is probable cerumen in each external auditory canal. CT cervical spine: No fracture. Slight spondylolisthesis at C7-T1 is stable and felt to be due to underlying spondylosis. There is multilevel arthropathic change with impression on exiting nerve roots at several levels as noted. There is carotid artery calcification bilaterally. Electronically Signed   By: Lowella Grip III M.D.   On: 10/20/2017 13:31   Ct Cervical Spine Wo Contrast  Result Date: 10/20/2017 CLINICAL DATA:  Pain following fall EXAM: CT HEAD WITHOUT CONTRAST CT CERVICAL SPINE  WITHOUT CONTRAST TECHNIQUE: Multidetector CT imaging of the head and cervical spine was performed following the standard protocol without intravenous contrast. Multiplanar CT image reconstructions of the cervical spine were also generated. COMPARISON:  CT head and CT cervical spine October 10, 2017 FINDINGS: CT HEAD FINDINGS Brain: Moderate diffuse atrophy is stable. There is no evident intracranial mass, hemorrhage, extra-axial fluid collection, or midline shift. There is slight small vessel disease in the centra semiovale bilaterally. Elsewhere gray-white compartments are normal. No acute infarct evident. Vascular: No hyperdense vessel. There is calcification in each cavernous carotid artery region. Skull: The bony calvarium appears intact. Sinuses/Orbits: There is a retention cyst in the mid left ethmoid air cell region. There is mucosal thickening in several ethmoid air cells. Orbits appear symmetric bilaterally with the exception of previous cataract removal on the left. Other: Mastoid air cells are clear. There is debris in each external auditory canal. CT CERVICAL SPINE FINDINGS Alignment: There is 2 mm of anterolisthesis of C7 on T1, stable. No other spondylolisthesis is evident. Skull base and vertebrae: The skull base and craniocervical junction regions appear normal. There is mild pannus posterior to the odontoid, not causing appreciable impression on the craniocervical junction. No fracture is demonstrable. There are no blastic or lytic bone lesions. Soft tissues and spinal canal: Prevertebral soft tissues and predental space regions are normal. There is no paraspinous lesion. No cord or canal hematoma evident. Disc levels: There is marked disc space narrowing at C5-6 and C6-7. there is multilevel facet arthropathy. There is impression on the exiting nerve root on the left at C3-4, on the left at C4-5, at C5-6 bilaterally, and C6-7 bilaterally due to bony hypertrophy. There is no frank disc extrusion. There  is borderline stenosis at C5-6. Upper chest: There is scarring in the lung apices with several small bullae in the right apex. Other: There is calcification in each carotid artery. IMPRESSION: CT head: Atrophy with slight periventricular small vessel disease. No evident acute infarct. No mass or hemorrhage. There are foci of arterial vascular calcification. There are areas of ethmoid sinus disease. There is probable cerumen in each external auditory canal. CT  cervical spine: No fracture. Slight spondylolisthesis at C7-T1 is stable and felt to be due to underlying spondylosis. There is multilevel arthropathic change with impression on exiting nerve roots at several levels as noted. There is carotid artery calcification bilaterally. Electronically Signed   By: Lowella Grip III M.D.   On: 10/20/2017 13:31    Procedures Procedures (including critical care time)  Medications Ordered in ED Medications - No data to display   Initial Impression / Assessment and Plan / ED Course  I have reviewed the triage vital signs and the nursing notes.  Pertinent labs & imaging results that were available during my care of the patient were reviewed by me and considered in my medical decision making (see chart for details).  Clinical Course as of Oct 21 956  Thu Oct 21, 2175  2220 77 year old male with dementia here after a fall at his facility.  He denies any complaints but on exam at least has some right shoulder pain when I manipulate it.  His x-rays do not show an obvious fracture and he had a CT and C-spine that did not also show any acute findings.  I think would be reasonable to have him return to facility and have them continue to watch for any worsening symptoms.   [MB]    Clinical Course User Index [MB] Hayden Rasmussen, MD     Final Clinical Impressions(s) / ED Diagnoses   Final diagnoses:  Fall, initial encounter  Acute pain of right shoulder    ED Discharge Orders    None         Hayden Rasmussen, MD 10/21/17 [phone]

## 2017-10-20 NOTE — Discharge Instructions (Signed)
You were evaluated in the emergency department for a fall.  Although you had no complaints he seemed to have some pain when we examined your right shoulder.  Your x-rays did not show an obvious fracture or dislocation and you also had a CAT scan of your head and cervical spine.  You are being returned to your facility and you recommend you be treated for pain as needed per your regular medications.  Please return if any problems.

## 2017-10-20 NOTE — ED Notes (Signed)
Patient transported to CT 

## 2017-10-21 DIAGNOSIS — G309 Alzheimer's disease, unspecified: Secondary | ICD-10-CM | POA: Diagnosis not present

## 2017-10-21 DIAGNOSIS — R2681 Unsteadiness on feet: Secondary | ICD-10-CM | POA: Diagnosis not present

## 2017-10-21 DIAGNOSIS — R296 Repeated falls: Secondary | ICD-10-CM | POA: Diagnosis not present

## 2017-10-23 ENCOUNTER — Encounter (HOSPITAL_COMMUNITY): Payer: Self-pay | Admitting: Emergency Medicine

## 2017-10-23 ENCOUNTER — Emergency Department (HOSPITAL_COMMUNITY): Payer: Medicare Other

## 2017-10-23 ENCOUNTER — Emergency Department (HOSPITAL_COMMUNITY)
Admission: EM | Admit: 2017-10-23 | Discharge: 2017-10-23 | Disposition: A | Discharge status: Home / Self Care | Payer: Medicare Other | Attending: Emergency Medicine | Admitting: Emergency Medicine

## 2017-10-23 ENCOUNTER — Other Ambulatory Visit: Payer: Self-pay

## 2017-10-23 DIAGNOSIS — Z87891 Personal history of nicotine dependence: Secondary | ICD-10-CM | POA: Diagnosis not present

## 2017-10-23 DIAGNOSIS — R21 Rash and other nonspecific skin eruption: Secondary | ICD-10-CM | POA: Diagnosis present

## 2017-10-23 DIAGNOSIS — L0390 Cellulitis, unspecified: Secondary | ICD-10-CM | POA: Diagnosis not present

## 2017-10-23 DIAGNOSIS — J449 Chronic obstructive pulmonary disease, unspecified: Secondary | ICD-10-CM | POA: Diagnosis not present

## 2017-10-23 DIAGNOSIS — F0390 Unspecified dementia without behavioral disturbance: Secondary | ICD-10-CM | POA: Diagnosis not present

## 2017-10-23 DIAGNOSIS — N368 Other specified disorders of urethra: Secondary | ICD-10-CM | POA: Diagnosis not present

## 2017-10-23 DIAGNOSIS — Z79899 Other long term (current) drug therapy: Secondary | ICD-10-CM | POA: Diagnosis not present

## 2017-10-23 DIAGNOSIS — N183 Chronic kidney disease, stage 3 (moderate): Secondary | ICD-10-CM | POA: Diagnosis not present

## 2017-10-23 DIAGNOSIS — B3749 Other urogenital candidiasis: Secondary | ICD-10-CM | POA: Diagnosis not present

## 2017-10-23 DIAGNOSIS — R58 Hemorrhage, not elsewhere classified: Secondary | ICD-10-CM | POA: Diagnosis not present

## 2017-10-23 DIAGNOSIS — S3739XA Other injury of urethra, initial encounter: Secondary | ICD-10-CM | POA: Diagnosis not present

## 2017-10-23 DIAGNOSIS — B379 Candidiasis, unspecified: Secondary | ICD-10-CM

## 2017-10-23 DIAGNOSIS — Z7982 Long term (current) use of aspirin: Secondary | ICD-10-CM | POA: Diagnosis not present

## 2017-10-23 DIAGNOSIS — T849XXA Unspecified complication of internal orthopedic prosthetic device, implant and graft, initial encounter: Secondary | ICD-10-CM | POA: Diagnosis not present

## 2017-10-23 DIAGNOSIS — N4822 Cellulitis of corpus cavernosum and penis: Secondary | ICD-10-CM | POA: Diagnosis not present

## 2017-10-23 DIAGNOSIS — M25552 Pain in left hip: Secondary | ICD-10-CM | POA: Diagnosis not present

## 2017-10-23 DIAGNOSIS — N369 Urethral disorder, unspecified: Secondary | ICD-10-CM

## 2017-10-23 DIAGNOSIS — T83498A Other mechanical complication of other prosthetic devices, implants and grafts of genital tract, initial encounter: Secondary | ICD-10-CM | POA: Diagnosis not present

## 2017-10-23 LAB — URINALYSIS, ROUTINE W REFLEX MICROSCOPIC
Bilirubin Urine: NEGATIVE
Glucose, UA: 50 mg/dL — AB
HGB URINE DIPSTICK: NEGATIVE
Ketones, ur: NEGATIVE mg/dL
Nitrite: NEGATIVE
PROTEIN: NEGATIVE mg/dL
Specific Gravity, Urine: 1.015 (ref 1.005–1.030)
pH: 5 (ref 5.0–8.0)

## 2017-10-23 LAB — CBC WITH DIFFERENTIAL/PLATELET
Basophils Absolute: 0 10*3/uL (ref 0.0–0.1)
Basophils Relative: 0 %
Eosinophils Absolute: 0.6 10*3/uL (ref 0.0–0.7)
Eosinophils Relative: 6 %
HEMATOCRIT: 34.2 % — AB (ref 39.0–52.0)
HEMOGLOBIN: 10.5 g/dL — AB (ref 13.0–17.0)
LYMPHS ABS: 0.8 10*3/uL (ref 0.7–4.0)
LYMPHS PCT: 8 %
MCH: 29.9 pg (ref 26.0–34.0)
MCHC: 30.7 g/dL (ref 30.0–36.0)
MCV: 97.4 fL (ref 78.0–100.0)
MONOS PCT: 10 %
Monocytes Absolute: 0.9 10*3/uL (ref 0.1–1.0)
NEUTROS ABS: 7.6 10*3/uL (ref 1.7–7.7)
NEUTROS PCT: 76 %
Platelets: 237 10*3/uL (ref 150–400)
RBC: 3.51 MIL/uL — AB (ref 4.22–5.81)
RDW: 16.4 % — ABNORMAL HIGH (ref 11.5–15.5)
WBC: 9.9 10*3/uL (ref 4.0–10.5)

## 2017-10-23 LAB — BASIC METABOLIC PANEL
Anion gap: 8 (ref 5–15)
BUN: 48 mg/dL — ABNORMAL HIGH (ref 8–23)
CHLORIDE: 111 mmol/L (ref 98–111)
CO2: 22 mmol/L (ref 22–32)
Calcium: 8.5 mg/dL — ABNORMAL LOW (ref 8.9–10.3)
Creatinine, Ser: 2.55 mg/dL — ABNORMAL HIGH (ref 0.61–1.24)
GFR calc Af Amer: 26 mL/min — ABNORMAL LOW (ref >60)
GFR calc non Af Amer: 22 mL/min — ABNORMAL LOW (ref >60)
GLUCOSE: 123 mg/dL — AB (ref 70–99)
POTASSIUM: 4.5 mmol/L (ref 3.5–5.1)
SODIUM: 141 mmol/L (ref 135–145)

## 2017-10-23 MED ORDER — DOXYCYCLINE HYCLATE 100 MG PO CAPS: 100.0000 mg | ORAL_CAPSULE | Freq: Two times a day (BID) | ORAL | 0 refills | Status: DC

## 2017-10-23 MED ORDER — LIDOCAINE HCL URETHRAL/MUCOSAL 2 % EX GEL: 1.0000 "application " | Freq: Once | CUTANEOUS | Status: DC

## 2017-10-23 MED ORDER — SODIUM CHLORIDE 0.9 % IV BOLUS
1000.0000 mL | Freq: Once | INTRAVENOUS | Status: AC
  Administered 2017-10-23: 1000 mL via INTRAVENOUS

## 2017-10-23 MED ORDER — FLUCONAZOLE 100 MG PO TABS: 100.0000 mg | ORAL_TABLET | Freq: Every day | ORAL | 0 refills | Status: DC

## 2017-10-23 NOTE — ED Provider Notes (Signed)
Anthony [name] Provider Note   CSN: [id number] Arrival date & time: 10/23/17  1506     History   Chief Complaint Chief Complaint  Patient presents with  . Groin Swelling    HPI Anthony [name] is a 77 y.o. male.  Level 5 caveat for dementia.  Patient brought to the emergency department for concerns about his penis.  Nursing home staff report his Foley catheter may have been "dislodged".  They are concerned about a possible disruption at the tip of his penis.  Patient is unable to give any other history other than his left hip hurts secondary to "a football injury"     Past Medical History:  Diagnosis Date  . Acute pyelonephritis 12/23/2014  . Asthma   . BPH (benign prostatic hyperplasia)   . Chronic back pain   . Chronic hip pain   . CKD (chronic kidney disease)   . COPD (chronic obstructive pulmonary disease) ([place])   . Dementia   . Depression   . GERD (gastroesophageal reflux disease)   . Kidney stone   . Neuropathy   . Pulmonary nodules 12/25/2014  . Restless leg syndrome     Patient Active Problem List   Diagnosis Date Noted  . Syncope and collapse 10/10/2017  . Acute lower UTI 10/10/2017  . AKI (acute kidney injury) ([place])   . Hematemesis 07/06/2017  . Normochromic normocytic anemia 06/19/2017  . CKD (chronic kidney disease) stage 3, GFR 30-59 ml/min (HCC) 06/19/2017  . Dementia 06/19/2017  . Decubitus ulcer 06/19/2017  . Open wnd of scalp   . S/p left hip fracture   . HCAP (healthcare-associated pneumonia) 10/31/2016  . CKD (chronic kidney disease) 10/31/2016  . COPD (chronic obstructive pulmonary disease) ([place]) 10/31/2016  . Hyponatremia 12/25/2014  . Hypokalemia 12/25/2014  . Normocytic anemia 12/25/2014  . Pulmonary nodules 12/25/2014  . Tobacco abuse 12/25/2014  . ARF (acute renal failure) ([place]) 12/24/2014  . Acute pyelonephritis 12/23/2014    Past Surgical History:  Procedure Laterality Date  . ABDOMINAL SURGERY     heria  .  ESOPHAGOGASTRODUODENOSCOPY N/A 07/06/2017   Procedure: ESOPHAGOGASTRODUODENOSCOPY (EGD);  Surgeon: Rogene Houston, MD;  [facility];  Service: Endoscopy;  Laterality: N/A;  . EYE SURGERY          Home Medications    Prior to Admission medications   Medication Sig Start Date End Date Taking? Authorizing Provider  acetaminophen (TYLENOL) 500 MG tablet Take 500 mg by mouth 3 (three) times daily as needed for mild pain.    Yes [provider]  albuterol (PROVENTIL HFA;VENTOLIN HFA) 108 (90 BASE) MCG/ACT inhaler Inhale 2 puffs into the lungs every 4 (four) hours as needed for wheezing or shortness of breath.   Yes [provider]  albuterol (PROVENTIL) (2.5 MG/3ML) 0.083% nebulizer solution Take 3 mLs (2.5 mg total) by nebulization every 4 (four) hours as needed for wheezing or shortness of breath. Patient taking differently: Take 2.5 mg by nebulization 4 (four) times daily.  07/07/17  Yes Johnson, Clanford L, MD  aspirin EC 81 MG tablet Take 81 mg by mouth daily.   Yes [provider]  baclofen (LIORESAL) 10 MG tablet Take 10 mg by mouth 2 (two) times daily.   Yes [provider]  buPROPion (WELLBUTRIN SR) 150 MG 12 hr tablet Take 150 mg by mouth 2 (two) times daily.   Yes [provider]  Calcium Citrate-Vitamin D (CALCIUM CITRATE+D3 PETITES) 200-250 MG-UNIT TABS Take 1  tablet by mouth daily.   Yes [provider]  cholecalciferol (VITAMIN D) 1000 units tablet Take 2,000 Units by mouth daily.   Yes [provider]  fluticasone (FLONASE) 50 MCG/ACT nasal spray Place 1 spray 2 (two) times daily into both nostrils.    Yes [provider]  Fluticasone-Salmeterol (ADVAIR) 250-50 MCG/DOSE AEPB Inhale 1 puff 2 (two) times daily into the lungs.   Yes [provider]  gabapentin (NEURONTIN) 400 MG capsule Take 400 mg by mouth 3 (three) times daily.   Yes [provider]  ipratropium (ATROVENT) 0.02 %  nebulizer solution Take 2.5 mLs (0.5 mg total) by nebulization every 6 (six) hours as needed for wheezing or shortness of breath. Patient taking differently: Take 0.5 mg by nebulization 4 (four) times daily.  07/07/17  Yes Johnson, Clanford L, MD  memantine (NAMENDA) 5 MG tablet Take 1 tablet (5 mg total) by mouth 2 (two) times daily. 10/14/17  Yes Kathie Dike, MD  nitrofurantoin (MACRODANTIN) 100 MG capsule Take 100 mg by mouth every 6 (six) hours. Starting 10/17/2017 x 5 days.   Yes [provider]  pantoprazole (PROTONIX) 40 MG tablet Take 1 tablet (40 mg total) by mouth 2 (two) times daily before a meal. 07/07/17  Yes Johnson, Clanford L, MD  polyethylene glycol (MIRALAX) packet Take 17 g by mouth daily. 10/14/17  Yes Kathie Dike, MD  pramipexole (MIRAPEX) 0.125 MG tablet Take 0.125 mg by mouth at bedtime.   Yes [provider]  sennosides-docusate sodium (SENOKOT-S) 8.6-50 MG tablet Take 1 tablet by mouth daily as needed for constipation.   Yes [provider]  sertraline (ZOLOFT) 50 MG tablet Take 50 mg by mouth daily.   Yes [provider]  sulfamethoxazole-trimethoprim (BACTRIM DS,SEPTRA DS) 800-160 MG tablet Take 1 tablet by mouth 2 (two) times daily. Starting on 10/16/2017 x 7 days.   Yes [provider]  tamsulosin (FLOMAX) 0.4 MG CAPS capsule Take 0.4 mg by mouth daily.    Yes [provider]  traMADol (ULTRAM) 50 MG tablet Take 1 tablet (50 mg total) by mouth every 6 (six) hours as needed for severe pain. Patient taking differently: Take 50 mg by mouth 2 (two) times daily.  07/07/17  Yes Johnson, Clanford L, MD  traZODone (DESYREL) 100 MG tablet Take 200 mg by mouth at bedtime.   Yes [provider]  triamcinolone ointment (KENALOG) 0.5 % Apply 1 application topically 2 (two) times daily. Apply to affected areas of forearms twice daily.   Yes [provider]  vitamin B-12 (CYANOCOBALAMIN) 1000 MCG tablet Take 1,000 mcg  by mouth daily.   Yes [provider]  Vitamin D, Ergocalciferol, (DRISDOL) 50000 units CAPS capsule Take 50,000 Units by mouth every Friday.    Yes [provider]  doxycycline (VIBRAMYCIN) 100 MG capsule Take 1 capsule (100 mg total) by mouth 2 (two) times daily. 10/23/17   Nat Christen, MD  fluconazole (DIFLUCAN) 100 MG tablet Take 1 tablet (100 mg total) by mouth daily. 10/23/17   Nat Christen, MD    Family History Family History  Family history unknown: Yes    Social History Social History   Tobacco Use  . Smoking status: Former Smoker    Packs/day: 0.50    Types: Cigarettes    Last attempt to quit: 06/09/2017    Years since quitting: 0.3  . Smokeless tobacco: Never Used  Substance Use Topics  . Alcohol use: No  . Drug use: No  Allergies   Bee venom; Penicillins; and Strawberry extract   Review of Systems Review of Systems  Unable to perform ROS: Dementia     Physical Exam Updated Vital Signs BP 126/63 (BP Location: Left Arm)   Pulse 74   Temp 97.8 F (36.6 C) (Oral)   Resp 16   Ht 6\' 3"  (1.905 m)   Wt 72.6 kg   SpO2 98%   BMI 20.00 kg/m   Physical Exam  Constitutional: He is oriented to person, place, and time.  Pleasant, demented  HENT:  Head: Normocephalic and atraumatic.  Eyes: Conjunctivae are normal.  Neck: Neck supple.  Cardiovascular: Normal rate and regular rhythm.  Pulmonary/Chest: Effort normal and breath sounds normal.  Abdominal: Soft. Bowel sounds are normal.  Genitourinary:  Genitourinary Comments: Genitourinary exam: Tip of urethra shows evidence of disruption into the lumen of the urethra.  Scrotum is erythematous but nontender to touch.  Musculoskeletal: Normal range of motion.  Neurological: He is alert and oriented to person, place, and time.  Skin: Skin is warm and dry.  Psychiatric: He has a normal mood and affect. His behavior is normal.  Nursing note and vitals reviewed.    ED Treatments / Results    Labs (all labs ordered are listed, but only abnormal results are displayed) Labs Reviewed  CBC WITH DIFFERENTIAL/PLATELET - Abnormal; Notable for the following components:      Result Value   RBC 3.51 (*)    Hemoglobin 10.5 (*)    HCT 34.2 (*)    RDW 16.4 (*)    All other components within normal limits  BASIC METABOLIC PANEL - Abnormal; Notable for the following components:   Glucose, Bld 123 (*)    BUN 48 (*)    Creatinine, Ser 2.55 (*)    Calcium 8.5 (*)    GFR calc non Af Amer 22 (*)    GFR calc Af Amer 26 (*)    All other components within normal limits  URINALYSIS, ROUTINE W REFLEX MICROSCOPIC - Abnormal; Notable for the following components:   Glucose, UA 50 (*)    Leukocytes, UA TRACE (*)    Bacteria, UA RARE (*)    All other components within normal limits  URINE CULTURE    EKG None  Radiology Dg Hip Unilat W Or Wo Pelvis 2-3 Views Left  Result Date: 10/23/2017 CLINICAL DATA:  Posterior lateral hip pain EXAM: DG HIP (WITH OR WITHOUT PELVIS) 2-3V LEFT COMPARISON:  None. FINDINGS: Generalized osteopenia. No hip fracture or dislocation. Right intramedullary nail with interlocking femoral neck screw partially visualized. Mild osteoarthritis of bilateral hips. Partially visualized is lumbar spine spondylosis. IMPRESSION: 1. No acute osseous injury of the left hip. Given the patient's age and osteopenia, if there is persistent clinical concern for an occult hip fracture, a MRI of the hip is recommended for increased sensitivity. 2. Mild osteoarthritis of bilateral hips. Electronically Signed   By: Kathreen Devoid   On: 10/23/2017 17:11    Procedures Procedures (including critical care time)  Medications Ordered in ED Medications  sodium chloride 0.9 % bolus 1,000 mL (0 mLs Intravenous Stopped 10/23/17 1715)     Initial Impression / Assessment and Plan / ED Course  I have reviewed the triage vital signs and the nursing notes.  Pertinent labs & imaging results that were  available during my care of the patient were reviewed by me and considered in my medical decision making (see chart for details).     Patient  has obvious distal urethral trauma with disruption of the inner lumen of the urethra.  His scrotal sac likely has a yeast infection.  Will Rx Diflucan and doxycycline 100 mg.  Recommend follow-up with urology.  Plain films of left hip negative  Final Clinical Impressions(s) / ED Diagnoses   Final diagnoses:  Urethral disruption  Yeast infection  Cellulitis, unspecified cellulitis site    ED Discharge Orders         Ordered    fluconazole (DIFLUCAN) 100 MG tablet  Daily     10/23/17 1850    doxycycline (VIBRAMYCIN) 100 MG capsule  2 times daily     10/23/17 1850           Nat Christen, MD 10/23/17 1858

## 2017-10-23 NOTE — Discharge Instructions (Signed)
Physical exam reveals a probable tear in his urethra.  Will need to see urology for this.  Phone number given.  Precription for antifungal (Diflucan) and new antibiotic for his scrotal erythema

## 2017-10-23 NOTE — ED Notes (Signed)
[facility] enroute to pick up

## 2017-10-23 NOTE — ED Notes (Addendum)
Report by Lonn Georgia, RN to [facility]

## 2017-10-23 NOTE — ED Triage Notes (Addendum)
Patient arrived via EMS. Patient from [facility]. Patient sent for injury with possible infection to penis. Per EMS [facility] staff states his foley cath may have become dislodged after fall on 8/8. Patient seen here in ED for fall. Small laceration to head of penis with foul odor noted.

## 2017-10-23 NOTE — ED Notes (Signed)
Pt is confused and unable to sign for himself

## 2017-10-25 DIAGNOSIS — F329 Major depressive disorder, single episode, unspecified: Secondary | ICD-10-CM | POA: Diagnosis not present

## 2017-10-25 DIAGNOSIS — R2689 Other abnormalities of gait and mobility: Secondary | ICD-10-CM | POA: Diagnosis not present

## 2017-10-25 DIAGNOSIS — R296 Repeated falls: Secondary | ICD-10-CM | POA: Diagnosis not present

## 2017-10-25 DIAGNOSIS — J449 Chronic obstructive pulmonary disease, unspecified: Secondary | ICD-10-CM | POA: Diagnosis not present

## 2017-10-25 DIAGNOSIS — F0280 Dementia in other diseases classified elsewhere without behavioral disturbance: Secondary | ICD-10-CM | POA: Diagnosis not present

## 2017-10-25 DIAGNOSIS — G309 Alzheimer's disease, unspecified: Secondary | ICD-10-CM | POA: Diagnosis not present

## 2017-10-25 LAB — URINE CULTURE: Culture: NO GROWTH

## 2017-10-29 ENCOUNTER — Encounter (HOSPITAL_COMMUNITY): Payer: Self-pay | Admitting: Emergency Medicine

## 2017-10-29 ENCOUNTER — Emergency Department (HOSPITAL_COMMUNITY)
Admission: EM | Admit: 2017-10-29 | Discharge: 2017-10-29 | Disposition: A | Discharge status: Skilled Nursing Facility | Payer: Medicare Other | Attending: Emergency Medicine | Admitting: Emergency Medicine

## 2017-10-29 ENCOUNTER — Emergency Department (HOSPITAL_COMMUNITY): Payer: Medicare Other

## 2017-10-29 DIAGNOSIS — R52 Pain, unspecified: Secondary | ICD-10-CM | POA: Diagnosis not present

## 2017-10-29 DIAGNOSIS — N183 Chronic kidney disease, stage 3 (moderate): Secondary | ICD-10-CM | POA: Diagnosis not present

## 2017-10-29 DIAGNOSIS — Z79899 Other long term (current) drug therapy: Secondary | ICD-10-CM | POA: Insufficient documentation

## 2017-10-29 DIAGNOSIS — J449 Chronic obstructive pulmonary disease, unspecified: Secondary | ICD-10-CM | POA: Insufficient documentation

## 2017-10-29 DIAGNOSIS — S299XXA Unspecified injury of thorax, initial encounter: Secondary | ICD-10-CM | POA: Diagnosis not present

## 2017-10-29 DIAGNOSIS — Z7982 Long term (current) use of aspirin: Secondary | ICD-10-CM | POA: Insufficient documentation

## 2017-10-29 DIAGNOSIS — Z87891 Personal history of nicotine dependence: Secondary | ICD-10-CM | POA: Diagnosis not present

## 2017-10-29 DIAGNOSIS — Y92129 Unspecified place in nursing home as the place of occurrence of the external cause: Secondary | ICD-10-CM | POA: Diagnosis not present

## 2017-10-29 DIAGNOSIS — W050XXA Fall from non-moving wheelchair, initial encounter: Secondary | ICD-10-CM | POA: Diagnosis not present

## 2017-10-29 DIAGNOSIS — Y939 Activity, unspecified: Secondary | ICD-10-CM | POA: Insufficient documentation

## 2017-10-29 DIAGNOSIS — S4991XA Unspecified injury of right shoulder and upper arm, initial encounter: Secondary | ICD-10-CM | POA: Diagnosis not present

## 2017-10-29 DIAGNOSIS — Y999 Unspecified external cause status: Secondary | ICD-10-CM | POA: Diagnosis not present

## 2017-10-29 DIAGNOSIS — R079 Chest pain, unspecified: Secondary | ICD-10-CM | POA: Diagnosis not present

## 2017-10-29 DIAGNOSIS — R531 Weakness: Secondary | ICD-10-CM | POA: Diagnosis not present

## 2017-10-29 DIAGNOSIS — S40011A Contusion of right shoulder, initial encounter: Secondary | ICD-10-CM

## 2017-10-29 DIAGNOSIS — F0390 Unspecified dementia without behavioral disturbance: Secondary | ICD-10-CM | POA: Diagnosis not present

## 2017-10-29 DIAGNOSIS — M25519 Pain in unspecified shoulder: Secondary | ICD-10-CM | POA: Diagnosis not present

## 2017-10-29 DIAGNOSIS — W19XXXA Unspecified fall, initial encounter: Secondary | ICD-10-CM

## 2017-10-29 NOTE — ED Triage Notes (Signed)
Pt brought in by ems for fall at [facility] due to wheelchair not being locked.  Pt c/o right shoulder pain as well as congested cough.

## 2017-10-29 NOTE — ED Provider Notes (Signed)
[facility] EMERGENCY DEPARTMENT Provider Note   CSN: [id number] Arrival date & time: 10/29/17  1331     History   Chief Complaint Chief Complaint  Patient presents with  . Fall    HPI Anthony [name] is a 77 y.o. male.  Pt presents to the ED today s/p fall.  Pt lives at [facility] and said his wheelchair was not locked and he fell out of it.  Pt c/o right shoulder pain.  He denies hitting his head.  He is not on blood thinners.     Past Medical History:  Diagnosis Date  . Acute pyelonephritis 12/23/2014  . Asthma   . BPH (benign prostatic hyperplasia)   . Chronic back pain   . Chronic hip pain   . CKD (chronic kidney disease)   . COPD (chronic obstructive pulmonary disease) ([place])   . Dementia   . Depression   . GERD (gastroesophageal reflux disease)   . Kidney stone   . Neuropathy   . Pulmonary nodules 12/25/2014  . Restless leg syndrome     Patient Active Problem List   Diagnosis Date Noted  . Syncope and collapse 10/10/2017  . Acute lower UTI 10/10/2017  . AKI (acute kidney injury) ([place])   . Hematemesis 07/06/2017  . Normochromic normocytic anemia 06/19/2017  . CKD (chronic kidney disease) stage 3, GFR 30-59 ml/min (HCC) 06/19/2017  . Dementia 06/19/2017  . Decubitus ulcer 06/19/2017  . Open wnd of scalp   . S/p left hip fracture   . HCAP (healthcare-associated pneumonia) 10/31/2016  . CKD (chronic kidney disease) 10/31/2016  . COPD (chronic obstructive pulmonary disease) ([place]) 10/31/2016  . Hyponatremia 12/25/2014  . Hypokalemia 12/25/2014  . Normocytic anemia 12/25/2014  . Pulmonary nodules 12/25/2014  . Tobacco abuse 12/25/2014  . ARF (acute renal failure) ([place]) 12/24/2014  . Acute pyelonephritis 12/23/2014    Past Surgical History:  Procedure Laterality Date  . ABDOMINAL SURGERY     heria  . ESOPHAGOGASTRODUODENOSCOPY N/A 07/06/2017   Procedure: ESOPHAGOGASTRODUODENOSCOPY (EGD);  Surgeon: Rogene Houston, MD;  [facility];   Service: Endoscopy;  Laterality: N/A;  . EYE SURGERY          Home Medications    Prior to Admission medications   Medication Sig Start Date End Date Taking? Authorizing Provider  acetaminophen (TYLENOL) 500 MG tablet Take 500 mg by mouth 3 (three) times daily as needed for mild pain.     [provider]  albuterol (PROVENTIL HFA;VENTOLIN HFA) 108 (90 BASE) MCG/ACT inhaler Inhale 2 puffs into the lungs every 4 (four) hours as needed for wheezing or shortness of breath.    [provider]  albuterol (PROVENTIL) (2.5 MG/3ML) 0.083% nebulizer solution Take 3 mLs (2.5 mg total) by nebulization every 4 (four) hours as needed for wheezing or shortness of breath. Patient taking differently: Take 2.5 mg by nebulization 4 (four) times daily.  07/07/17   Johnson, Clanford L, MD  aspirin EC 81 MG tablet Take 81 mg by mouth daily.    [provider]  baclofen (LIORESAL) 10 MG tablet Take 10 mg by mouth 2 (two) times daily.    [provider]  buPROPion (WELLBUTRIN SR) 150 MG 12 hr tablet Take 150 mg by mouth 2 (two) times daily.    [provider]  Calcium Citrate-Vitamin D (CALCIUM CITRATE+D3 PETITES) 200-250 MG-UNIT TABS Take 1 tablet by mouth daily.    [provider]  cholecalciferol (VITAMIN D) 1000 units tablet Take  2,000 Units by mouth daily.    [provider]  doxycycline (VIBRAMYCIN) 100 MG capsule Take 1 capsule (100 mg total) by mouth 2 (two) times daily. 10/23/17   Nat Christen, MD  fluconazole (DIFLUCAN) 100 MG tablet Take 1 tablet (100 mg total) by mouth daily. 10/23/17   Nat Christen, MD  fluticasone [facility]) 50 MCG/ACT nasal spray Place 1 spray 2 (two) times daily into both nostrils.     [provider]  Fluticasone-Salmeterol (ADVAIR) 250-50 MCG/DOSE AEPB Inhale 1 puff 2 (two) times daily into the lungs.    [provider]  gabapentin (NEURONTIN) 400 MG capsule Take 400 mg by mouth 3 (three) times daily.     [provider]  ipratropium (ATROVENT) 0.02 % nebulizer solution Take 2.5 mLs (0.5 mg total) by nebulization every 6 (six) hours as needed for wheezing or shortness of breath. Patient taking differently: Take 0.5 mg by nebulization 4 (four) times daily.  07/07/17   Johnson, Clanford L, MD  memantine (NAMENDA) 5 MG tablet Take 1 tablet (5 mg total) by mouth 2 (two) times daily. 10/14/17   Kathie Dike, MD  nitrofurantoin (MACRODANTIN) 100 MG capsule Take 100 mg by mouth every 6 (six) hours. Starting 10/17/2017 x 5 days.    [provider]  pantoprazole (PROTONIX) 40 MG tablet Take 1 tablet (40 mg total) by mouth 2 (two) times daily before a meal. 07/07/17   Johnson, Clanford L, MD  polyethylene glycol (MIRALAX) packet Take 17 g by mouth daily. 10/14/17   Kathie Dike, MD  pramipexole (MIRAPEX) 0.125 MG tablet Take 0.125 mg by mouth at bedtime.    [provider]  sennosides-docusate sodium (SENOKOT-S) 8.6-50 MG tablet Take 1 tablet by mouth daily as needed for constipation.    [provider]  sertraline (ZOLOFT) 50 MG tablet Take 50 mg by mouth daily.    [provider]  sulfamethoxazole-trimethoprim (BACTRIM DS,SEPTRA DS) 800-160 MG tablet Take 1 tablet by mouth 2 (two) times daily. Starting on 10/16/2017 x 7 days.    [provider]  tamsulosin (FLOMAX) 0.4 MG CAPS capsule Take 0.4 mg by mouth daily.     [provider]  traMADol (ULTRAM) 50 MG tablet Take 1 tablet (50 mg total) by mouth every 6 (six) hours as needed for severe pain. Patient taking differently: Take 50 mg by mouth 2 (two) times daily.  07/07/17   Johnson, Clanford L, MD  traZODone (DESYREL) 100 MG tablet Take 200 mg by mouth at bedtime.    [provider]  triamcinolone ointment (KENALOG) 0.5 % Apply 1 application topically 2 (two) times daily. Apply to affected areas of forearms twice daily.    [provider]  vitamin B-12 (CYANOCOBALAMIN) 1000 MCG  tablet Take 1,000 mcg by mouth daily.    [provider]  Vitamin D, Ergocalciferol, (DRISDOL) 50000 units CAPS capsule Take 50,000 Units by mouth every Friday.     [provider]    Family History Family History  Family history unknown: Yes    Social History Social History   Tobacco Use  . Smoking status: Former Smoker    Packs/day: 0.50    Types: Cigarettes    Last attempt to quit: 06/09/2017    Years since quitting: 0.3  . Smokeless tobacco: Never Used  Substance Use Topics  . Alcohol use: No  . Drug use: No     Allergies   Bee venom; Penicillins; and Strawberry extract   Review of Systems  Review of Systems  Musculoskeletal:       Right shoulder  All other systems reviewed and are negative.    Physical Exam Updated Vital Signs BP 134/72 (BP Location: Left Arm)   Pulse 82   Temp 97.9 F (36.6 C) (Oral)   Resp 18   Ht 6\' 3"  (1.905 m)   Wt 72 kg   SpO2 93%   BMI 19.84 kg/m   Physical Exam  Constitutional: He is oriented to person, place, and time. He appears well-developed and well-nourished.  HENT:  Head: Normocephalic and atraumatic.  Right Ear: External ear normal.  Left Ear: External ear normal.  Nose: Nose normal.  Mouth/Throat: Oropharynx is clear and moist.  Eyes: Pupils are equal, round, and reactive to light. Conjunctivae and EOM are normal.  Neck: Normal range of motion. Neck supple.  Cardiovascular: Normal rate, regular rhythm, normal heart sounds and intact distal pulses.  Pulmonary/Chest: Effort normal and breath sounds normal.  Cough on exam  Abdominal: Soft. Bowel sounds are normal.  Genitourinary:  Genitourinary Comments: Indwelling foley  Musculoskeletal: Normal range of motion.       Right shoulder: He exhibits tenderness.  Neurological: He is alert and oriented to person, place, and time.  Skin: Skin is warm. Capillary refill takes less than 2 seconds.  Psychiatric: He has a normal mood and affect. His behavior  is normal. Judgment and thought content normal.  Nursing note and vitals reviewed.    ED Treatments / Results  Labs (all labs ordered are listed, but only abnormal results are displayed) Labs Reviewed - No data to display  EKG None  Radiology Dg Chest 1 View  Result Date: 10/29/2017 CLINICAL DATA:  Pain following fall EXAM: CHEST  1 VIEW COMPARISON:  October 10, 2017 and June 19, 2017 FINDINGS: There are areas of mild scarring in each mid lower lung zone. There is no edema or consolidation. The heart size and pulmonary vascularity are normal. No adenopathy. There is aortic atherosclerosis. There is a stable mixed sclerotic and lytic lesion involving the proximal right humerus. IMPRESSION: Areas of scarring bilaterally. No edema or consolidation. Heart size normal. There is aortic atherosclerosis. Stable mixed sclerotic and lytic lesion in the proximal right humerus. Etiology uncertain. Aortic Atherosclerosis (ICD10-I70.0). Electronically Signed   By: Lowella Grip III M.D.   On: 10/29/2017 14:28   Dg Shoulder Right  Result Date: 10/29/2017 CLINICAL DATA:  Fall out of wheelchair. EXAM: RIGHT SHOULDER - 2+ VIEW COMPARISON:  Right shoulder x-rays dated October 20, 2017. FINDINGS: No acute fracture or dislocation. Moderate acromioclavicular osteoarthritis, similar to prior study. The glenohumeral joint space is relatively preserved. Unchanged lucent and sclerotic lesion in the proximal humerus. Osteopenia. Soft tissues are unremarkable. IMPRESSION: 1.  No acute osseous abnormality. 2. Unchanged chondroid lesion in the proximal right humerus. Electronically Signed   By: Titus Dubin M.D.   On: 10/29/2017 14:28    Procedures Procedures (including critical care time)  Medications Ordered in ED Medications - No data to display   Initial Impression / Assessment and Plan / ED Course  I have reviewed the triage vital signs and the nursing notes.  Pertinent labs & imaging results that were  available during my care of the patient were reviewed by me and considered in my medical decision making (see chart for details).     No fractures.  Nursing will notify guardian.  Stable for d/c.  Return if worse.  Final Clinical Impressions(s) / ED Diagnoses  Final diagnoses:  Fall, initial encounter  Contusion of right shoulder, initial encounter    ED Discharge Orders    None       Isla Pence, MD 10/29/17 1435

## 2017-10-31 DIAGNOSIS — R2689 Other abnormalities of gait and mobility: Secondary | ICD-10-CM | POA: Diagnosis not present

## 2017-10-31 DIAGNOSIS — R296 Repeated falls: Secondary | ICD-10-CM | POA: Diagnosis not present

## 2017-10-31 DIAGNOSIS — G309 Alzheimer's disease, unspecified: Secondary | ICD-10-CM | POA: Diagnosis not present

## 2017-10-31 DIAGNOSIS — F329 Major depressive disorder, single episode, unspecified: Secondary | ICD-10-CM | POA: Diagnosis not present

## 2017-10-31 DIAGNOSIS — J449 Chronic obstructive pulmonary disease, unspecified: Secondary | ICD-10-CM | POA: Diagnosis not present

## 2017-10-31 DIAGNOSIS — T8189XD Other complications of procedures, not elsewhere classified, subsequent encounter: Secondary | ICD-10-CM | POA: Diagnosis not present

## 2017-10-31 DIAGNOSIS — F0280 Dementia in other diseases classified elsewhere without behavioral disturbance: Secondary | ICD-10-CM | POA: Diagnosis not present

## 2017-11-01 DIAGNOSIS — R339 Retention of urine, unspecified: Secondary | ICD-10-CM | POA: Diagnosis not present

## 2017-11-01 DIAGNOSIS — N4822 Cellulitis of corpus cavernosum and penis: Secondary | ICD-10-CM | POA: Diagnosis not present

## 2017-11-02 DIAGNOSIS — R296 Repeated falls: Secondary | ICD-10-CM | POA: Diagnosis not present

## 2017-11-02 DIAGNOSIS — F329 Major depressive disorder, single episode, unspecified: Secondary | ICD-10-CM | POA: Diagnosis not present

## 2017-11-02 DIAGNOSIS — J449 Chronic obstructive pulmonary disease, unspecified: Secondary | ICD-10-CM | POA: Diagnosis not present

## 2017-11-02 DIAGNOSIS — F0280 Dementia in other diseases classified elsewhere without behavioral disturbance: Secondary | ICD-10-CM | POA: Diagnosis not present

## 2017-11-02 DIAGNOSIS — G309 Alzheimer's disease, unspecified: Secondary | ICD-10-CM | POA: Diagnosis not present

## 2017-11-02 DIAGNOSIS — R2689 Other abnormalities of gait and mobility: Secondary | ICD-10-CM | POA: Diagnosis not present

## 2017-11-06 ENCOUNTER — Encounter (HOSPITAL_COMMUNITY): Payer: Self-pay | Admitting: Emergency Medicine

## 2017-11-06 ENCOUNTER — Other Ambulatory Visit: Payer: Self-pay

## 2017-11-06 ENCOUNTER — Inpatient Hospital Stay (HOSPITAL_COMMUNITY)
Admission: EM | Admit: 2017-11-06 | Discharge: 2017-11-11 | DRG: 699 | Disposition: A | Discharge status: Skilled Nursing Facility | Payer: Medicare Other | Source: Skilled Nursing Facility | Attending: Internal Medicine | Admitting: Internal Medicine

## 2017-11-06 DIAGNOSIS — T83011A Breakdown (mechanical) of indwelling urethral catheter, initial encounter: Principal | ICD-10-CM | POA: Diagnosis present

## 2017-11-06 DIAGNOSIS — D631 Anemia in chronic kidney disease: Secondary | ICD-10-CM | POA: Diagnosis present

## 2017-11-06 DIAGNOSIS — Z91018 Allergy to other foods: Secondary | ICD-10-CM

## 2017-11-06 DIAGNOSIS — R0902 Hypoxemia: Secondary | ICD-10-CM | POA: Diagnosis present

## 2017-11-06 DIAGNOSIS — D649 Anemia, unspecified: Secondary | ICD-10-CM | POA: Diagnosis not present

## 2017-11-06 DIAGNOSIS — N4822 Cellulitis of corpus cavernosum and penis: Secondary | ICD-10-CM | POA: Diagnosis not present

## 2017-11-06 DIAGNOSIS — M75111 Incomplete rotator cuff tear or rupture of right shoulder, not specified as traumatic: Secondary | ICD-10-CM | POA: Diagnosis present

## 2017-11-06 DIAGNOSIS — R404 Transient alteration of awareness: Secondary | ICD-10-CM | POA: Diagnosis not present

## 2017-11-06 DIAGNOSIS — Y733 Surgical instruments, materials and gastroenterology and urology devices (including sutures) associated with adverse incidents: Secondary | ICD-10-CM | POA: Diagnosis present

## 2017-11-06 DIAGNOSIS — J9811 Atelectasis: Secondary | ICD-10-CM | POA: Diagnosis present

## 2017-11-06 DIAGNOSIS — J449 Chronic obstructive pulmonary disease, unspecified: Secondary | ICD-10-CM | POA: Diagnosis present

## 2017-11-06 DIAGNOSIS — G629 Polyneuropathy, unspecified: Secondary | ICD-10-CM | POA: Diagnosis present

## 2017-11-06 DIAGNOSIS — Z91030 Bee allergy status: Secondary | ICD-10-CM

## 2017-11-06 DIAGNOSIS — I714 Abdominal aortic aneurysm, without rupture: Secondary | ICD-10-CM | POA: Diagnosis present

## 2017-11-06 DIAGNOSIS — N289 Disorder of kidney and ureter, unspecified: Secondary | ICD-10-CM | POA: Diagnosis not present

## 2017-11-06 DIAGNOSIS — K5641 Fecal impaction: Secondary | ICD-10-CM | POA: Diagnosis present

## 2017-11-06 DIAGNOSIS — K219 Gastro-esophageal reflux disease without esophagitis: Secondary | ICD-10-CM | POA: Diagnosis present

## 2017-11-06 DIAGNOSIS — N183 Chronic kidney disease, stage 3 unspecified: Secondary | ICD-10-CM | POA: Diagnosis present

## 2017-11-06 DIAGNOSIS — Z87442 Personal history of urinary calculi: Secondary | ICD-10-CM

## 2017-11-06 DIAGNOSIS — N401 Enlarged prostate with lower urinary tract symptoms: Secondary | ICD-10-CM | POA: Diagnosis present

## 2017-11-06 DIAGNOSIS — F0390 Unspecified dementia without behavioral disturbance: Secondary | ICD-10-CM | POA: Diagnosis not present

## 2017-11-06 DIAGNOSIS — N138 Other obstructive and reflux uropathy: Secondary | ICD-10-CM | POA: Diagnosis present

## 2017-11-06 DIAGNOSIS — M47816 Spondylosis without myelopathy or radiculopathy, lumbar region: Secondary | ICD-10-CM | POA: Diagnosis present

## 2017-11-06 DIAGNOSIS — Z7951 Long term (current) use of inhaled steroids: Secondary | ICD-10-CM

## 2017-11-06 DIAGNOSIS — Z87891 Personal history of nicotine dependence: Secondary | ICD-10-CM

## 2017-11-06 DIAGNOSIS — N136 Pyonephrosis: Secondary | ICD-10-CM | POA: Diagnosis not present

## 2017-11-06 DIAGNOSIS — M4306 Spondylolysis, lumbar region: Secondary | ICD-10-CM | POA: Diagnosis present

## 2017-11-06 DIAGNOSIS — N1330 Unspecified hydronephrosis: Secondary | ICD-10-CM

## 2017-11-06 DIAGNOSIS — A419 Sepsis, unspecified organism: Secondary | ICD-10-CM | POA: Diagnosis not present

## 2017-11-06 DIAGNOSIS — G8929 Other chronic pain: Secondary | ICD-10-CM | POA: Diagnosis present

## 2017-11-06 DIAGNOSIS — G2581 Restless legs syndrome: Secondary | ICD-10-CM | POA: Diagnosis present

## 2017-11-06 DIAGNOSIS — Z7982 Long term (current) use of aspirin: Secondary | ICD-10-CM

## 2017-11-06 DIAGNOSIS — R609 Edema, unspecified: Secondary | ICD-10-CM | POA: Diagnosis not present

## 2017-11-06 DIAGNOSIS — L0390 Cellulitis, unspecified: Secondary | ICD-10-CM | POA: Diagnosis present

## 2017-11-06 DIAGNOSIS — M549 Dorsalgia, unspecified: Secondary | ICD-10-CM | POA: Diagnosis present

## 2017-11-06 DIAGNOSIS — R52 Pain, unspecified: Secondary | ICD-10-CM

## 2017-11-06 DIAGNOSIS — Z880 Allergy status to penicillin: Secondary | ICD-10-CM

## 2017-11-06 MED ORDER — SODIUM CHLORIDE 0.9 % IV SOLN
2.0000 g | Freq: Once | INTRAVENOUS | Status: AC
  Administered 2017-11-07: 2 g via INTRAVENOUS
  Filled 2017-11-06: qty 2

## 2017-11-06 MED ORDER — VANCOMYCIN HCL IN DEXTROSE 1-5 GM/200ML-% IV SOLN
1000.0000 mg | Freq: Once | INTRAVENOUS | Status: AC
  Administered 2017-11-07: 1000 mg via INTRAVENOUS
  Filled 2017-11-06: qty 200

## 2017-11-06 NOTE — ED Triage Notes (Signed)
Pt with 3 week history of foley placement with excoriated area around his scrotal area Pt genital area resembles diaper rash under pt diaper with yeast? Odor from diaper- area is reddened.  No note noted per foley care

## 2017-11-06 NOTE — ED Notes (Signed)
Pt is a resident of [facility]  Has a 3 week history of foley placement   Genital Area is reddened and smells of ? Yeast- with reddened area resembling diaper rash Diaper is in place-   Foley bag hanger is broken, urine on foley is thick with particulate

## 2017-11-06 NOTE — ED Provider Notes (Signed)
[facility] Provider Note   CSN: [id number] Arrival date & time:        History   Chief Complaint Chief Complaint  Patient presents with  . Excoriated genital area around foley x 3 weeks    HPI Anthony [name] is a 77 y.o. male.  The history is provided by the patient, the nursing home and the EMS personnel. The history is limited by the condition of the patient (Dementia).  He has history of dementia, COPD, chronic kidney disease and was sent from nursing home because of concern about excoriation around Foley catheter which was placed 3 weeks ago.  Patient is not complaining of any pain currently.  Past Medical History:  Diagnosis Date  . Acute pyelonephritis 12/23/2014  . Asthma   . BPH (benign prostatic hyperplasia)   . Chronic back pain   . Chronic hip pain   . CKD (chronic kidney disease)   . COPD (chronic obstructive pulmonary disease) ([place])   . Dementia   . Depression   . GERD (gastroesophageal reflux disease)   . Kidney stone   . Neuropathy   . Pulmonary nodules 12/25/2014  . Restless leg syndrome     Patient Active Problem List   Diagnosis Date Noted  . Syncope and collapse 10/10/2017  . Acute lower UTI 10/10/2017  . AKI (acute kidney injury) ([place])   . Hematemesis 07/06/2017  . Normochromic normocytic anemia 06/19/2017  . CKD (chronic kidney disease) stage 3, GFR 30-59 ml/min (HCC) 06/19/2017  . Dementia 06/19/2017  . Decubitus ulcer 06/19/2017  . Open wnd of scalp   . S/p left hip fracture   . HCAP (healthcare-associated pneumonia) 10/31/2016  . CKD (chronic kidney disease) 10/31/2016  . COPD (chronic obstructive pulmonary disease) ([place]) 10/31/2016  . Hyponatremia 12/25/2014  . Hypokalemia 12/25/2014  . Normocytic anemia 12/25/2014  . Pulmonary nodules 12/25/2014  . Tobacco abuse 12/25/2014  . ARF (acute renal failure) ([place]) 12/24/2014  . Acute pyelonephritis 12/23/2014    Past Surgical History:  Procedure Laterality Date    . ABDOMINAL SURGERY     heria  . ESOPHAGOGASTRODUODENOSCOPY N/A 07/06/2017   Procedure: ESOPHAGOGASTRODUODENOSCOPY (EGD);  Surgeon: Rogene Houston, MD;  Location[facility];  Service: Endoscopy;  Laterality: N/A;  . EYE SURGERY          Home Medications    Prior to Admission medications   Medication Sig Start Date End Date Taking? Authorizing Provider  acetaminophen (TYLENOL) 500 MG tablet Take 500 mg by mouth 3 (three) times daily as needed for mild pain.     [provider]  albuterol (PROVENTIL HFA;VENTOLIN HFA) 108 (90 BASE) MCG/ACT inhaler Inhale 2 puffs into the lungs every 4 (four) hours as needed for wheezing or shortness of breath.    [provider]  albuterol (PROVENTIL) (2.5 MG/3ML) 0.083% nebulizer solution Take 3 mLs (2.5 mg total) by nebulization every 4 (four) hours as needed for wheezing or shortness of breath. Patient taking differently: Take 2.5 mg by nebulization 4 (four) times daily.  07/07/17   Johnson, Clanford L, MD  aspirin EC 81 MG tablet Take 81 mg by mouth daily.    [provider]  baclofen (LIORESAL) 10 MG tablet Take 10 mg by mouth 2 (two) times daily.    [provider]  buPROPion (WELLBUTRIN SR) 150 MG 12 hr tablet Take 150 mg by mouth 2 (two) times daily.    [provider]  Calcium Citrate-Vitamin D (CALCIUM CITRATE+D3 PETITES)  200-250 MG-UNIT TABS Take 1 tablet by mouth daily.    [provider]  cholecalciferol (VITAMIN D) 1000 units tablet Take 2,000 Units by mouth daily.    [provider]  doxycycline (VIBRAMYCIN) 100 MG capsule Take 1 capsule (100 mg total) by mouth 2 (two) times daily. 10/23/17   Nat Christen, MD  fluconazole (DIFLUCAN) 100 MG tablet Take 1 tablet (100 mg total) by mouth daily. 10/23/17   Nat Christen, MD  fluticasone [facility]) 50 MCG/ACT nasal spray Place 1 spray 2 (two) times daily into both nostrils.     [provider]  Fluticasone-Salmeterol (ADVAIR)  250-50 MCG/DOSE AEPB Inhale 1 puff 2 (two) times daily into the lungs.    [provider]  gabapentin (NEURONTIN) 400 MG capsule Take 400 mg by mouth 3 (three) times daily.    [provider]  ipratropium (ATROVENT) 0.02 % nebulizer solution Take 2.5 mLs (0.5 mg total) by nebulization every 6 (six) hours as needed for wheezing or shortness of breath. Patient taking differently: Take 0.5 mg by nebulization 4 (four) times daily.  07/07/17   Johnson, Clanford L, MD  memantine (NAMENDA) 5 MG tablet Take 1 tablet (5 mg total) by mouth 2 (two) times daily. 10/14/17   Kathie Dike, MD  nitrofurantoin (MACRODANTIN) 100 MG capsule Take 100 mg by mouth every 6 (six) hours. Starting 10/17/2017 x 5 days.    [provider]  pantoprazole (PROTONIX) 40 MG tablet Take 1 tablet (40 mg total) by mouth 2 (two) times daily before a meal. 07/07/17   Johnson, Clanford L, MD  polyethylene glycol (MIRALAX) packet Take 17 g by mouth daily. 10/14/17   Kathie Dike, MD  pramipexole (MIRAPEX) 0.125 MG tablet Take 0.125 mg by mouth at bedtime.    [provider]  sennosides-docusate sodium (SENOKOT-S) 8.6-50 MG tablet Take 1 tablet by mouth daily as needed for constipation.    [provider]  sertraline (ZOLOFT) 50 MG tablet Take 50 mg by mouth daily.    [provider]  sulfamethoxazole-trimethoprim (BACTRIM DS,SEPTRA DS) 800-160 MG tablet Take 1 tablet by mouth 2 (two) times daily. Starting on 10/16/2017 x 7 days.    [provider]  tamsulosin (FLOMAX) 0.4 MG CAPS capsule Take 0.4 mg by mouth daily.     [provider]  traMADol (ULTRAM) 50 MG tablet Take 1 tablet (50 mg total) by mouth every 6 (six) hours as needed for severe pain. Patient taking differently: Take 50 mg by mouth 2 (two) times daily.  07/07/17   Johnson, Clanford L, MD  traZODone (DESYREL) 100 MG tablet Take 200 mg by mouth at bedtime.    [provider]  triamcinolone ointment  (KENALOG) 0.5 % Apply 1 application topically 2 (two) times daily. Apply to affected areas of forearms twice daily.    [provider]  vitamin B-12 (CYANOCOBALAMIN) 1000 MCG tablet Take 1,000 mcg by mouth daily.    [provider]  Vitamin D, Ergocalciferol, (DRISDOL) 50000 units CAPS capsule Take 50,000 Units by mouth every Friday.     [provider]    Family History Family History  Family history unknown: Yes    Social History Social History   Tobacco Use  . Smoking status: Former Smoker    Packs/day: 0.50    Types: Cigarettes    Last attempt to quit: 06/09/2017    Years since quitting: 0.4  . Smokeless tobacco: Never Used  Substance Use Topics  . Alcohol use: No  .  Drug use: No     Allergies   Bee venom; Penicillins; and Strawberry extract   Review of Systems Review of Systems  Unable to perform ROS: Dementia     Physical Exam Updated Vital Signs BP 121/75 (BP Location: Left Arm)   Pulse 72   Temp 98 F (36.7 C) (Oral)   Resp 18   Ht 6\' 4"  (1.93 m)   Wt 70.8 kg   SpO2 90%   BMI 18.99 kg/m   Physical Exam  Nursing note and vitals reviewed.  77 year old male, resting comfortably and in no acute distress. Vital signs are normal. Oxygen saturation is 90%, which is hypoxic. Head is normocephalic and atraumatic. PERRLA.  Gaze is disconjugate with right eye deviated laterally. Oropharynx is clear.  Mucous membranes are dry. Neck is nontender and supple without adenopathy or JVD. Back is nontender and there is no CVA tenderness. Lungs are clear without rales, wheezes, or rhonchi. Chest is nontender. Heart has regular rate and rhythm without murmur. Abdomen is soft, flat, nontender without masses or hepatosplenomegaly and peristalsis is normoactive. Genitalia: Uncircumcised penis, Foley catheter in place.  There is significant erythema and swelling of the prepuce with marked tenderness.  There is erythema of the scrotum without  significant tenderness.  Foley catheter appears to have eroded through the posterior aspect of the urethra versus underlying hypospadias.  Purulent discharge is present around the glans penis.  No tenderness or crepitus or fluctuance of the perineal area. Extremities have no cyanosis or edema, full range of motion is present. Skin is warm and dry without rash. Neurologic: Mental status is normal, cranial nerves are intact, there are no motor or sensory deficits.  ED Treatments / Results  Labs (all labs ordered are listed, but only abnormal results are displayed) Labs Reviewed  COMPREHENSIVE METABOLIC PANEL - Abnormal; Notable for the following components:      Result Value   Glucose, Bld 115 (*)    BUN 41 (*)    Creatinine, Ser 2.28 (*)    Calcium 8.5 (*)    Albumin 3.0 (*)    GFR calc non Af Amer 26 (*)    GFR calc Af Amer 30 (*)    All other components within normal limits  CBC WITH DIFFERENTIAL/PLATELET - Abnormal; Notable for the following components:   WBC 13.7 (*)    RBC 3.41 (*)    Hemoglobin 10.2 (*)    HCT 32.4 (*)    Neutro Abs 11.1 (*)    Monocytes Absolute 1.4 (*)    All other components within normal limits  URINALYSIS, ROUTINE W REFLEX MICROSCOPIC - Abnormal; Notable for the following components:   APPearance CLOUDY (*)    All other components within normal limits  CULTURE, BLOOD (ROUTINE X 2)  CULTURE, BLOOD (ROUTINE X 2)  AEROBIC CULTURE (SUPERFICIAL SPECIMEN)  I-STAT CG4 LACTIC ACID, ED    EKG EKG Interpretation  Date/Time:  Monday November 07 2017 00:13:21 EDT Ventricular Rate:  81 PR Interval:    QRS Duration: 128 QT Interval:  432 QTC Calculation: 502 R Axis:   -46 Text Interpretation:  duplicate, discard Confirmed by Delora Fuel ([id number]) on 11/07/2017 12:16:28 AM   Radiology Dg Chest Port 1 View  Result Date: 11/07/2017 CLINICAL DATA:  Sepsis EXAM: PORTABLE CHEST 1 VIEW COMPARISON:  10/29/2017 FINDINGS: Chronic changes/scarring in the lungs  bilaterally. Heart is normal size. No acute confluent airspace opacities, effusions or edema. No acute bony abnormality. IMPRESSION: Chronic changes.  No active disease. Electronically Signed   By: Rolm Baptise M.D.   On: 11/07/2017 00:34    Procedures Procedures  Medications Ordered in ED Medications  nystatin (MYCOSTATIN/NYSTOP) topical powder (has no administration in time range)  vancomycin (VANCOCIN) IVPB 1000 mg/200 mL premix (0 mg Intravenous Stopped 11/07/17 0149)  aztreonam (AZACTAM) 2 g in sodium chloride 0.9 % 100 mL IVPB (0 g Intravenous Stopped 11/07/17 0149)     Initial Impression / Assessment and Plan / ED Course  I have reviewed the triage vital signs and the nursing notes.  Pertinent labs & imaging results that were available during my care of the patient were reviewed by me and considered in my medical decision making (see chart for details).  Apparent cellulitis of the glans penis.  Possible erosion of the urethra from Foley catheter versus underlying hypospadias.  Old records are reviewed, and Foley catheter was placed during hospitalization July 29 for syncope and pneumonia.  I am unable to find any notes of genital exam at last hospital admission.  I am concerned of the degree of infection around the penis and feel he may be better served with a suprapubic catheter, if external drainage is needed.  He is started on sepsis protocol and started on antibiotics.  Lactic acid is come back normal.  WBC is mildly elevated with a slight left shift.  Renal function is improved over recent values.  Mild anemia is present which is unchanged from baseline.  Case is discussed with Dr. Darrick Meigs of [facility], who agrees to admit the patient.  Final Clinical Impressions(s) / ED Diagnoses   Final diagnoses:  Cellulitis of penis  Renal insufficiency  Normochromic normocytic anemia    ED Discharge Orders    None       Delora Fuel, MD 50/27/74 [phone]

## 2017-11-07 ENCOUNTER — Inpatient Hospital Stay (HOSPITAL_COMMUNITY): Payer: Medicare Other

## 2017-11-07 ENCOUNTER — Emergency Department (HOSPITAL_COMMUNITY): Payer: Medicare Other

## 2017-11-07 DIAGNOSIS — J9811 Atelectasis: Secondary | ICD-10-CM | POA: Diagnosis present

## 2017-11-07 DIAGNOSIS — F339 Major depressive disorder, recurrent, unspecified: Secondary | ICD-10-CM | POA: Diagnosis not present

## 2017-11-07 DIAGNOSIS — N401 Enlarged prostate with lower urinary tract symptoms: Secondary | ICD-10-CM | POA: Diagnosis present

## 2017-11-07 DIAGNOSIS — Y733 Surgical instruments, materials and gastroenterology and urology devices (including sutures) associated with adverse incidents: Secondary | ICD-10-CM | POA: Diagnosis present

## 2017-11-07 DIAGNOSIS — G629 Polyneuropathy, unspecified: Secondary | ICD-10-CM | POA: Diagnosis present

## 2017-11-07 DIAGNOSIS — G990 Autonomic neuropathy in diseases classified elsewhere: Secondary | ICD-10-CM | POA: Diagnosis not present

## 2017-11-07 DIAGNOSIS — M549 Dorsalgia, unspecified: Secondary | ICD-10-CM | POA: Diagnosis present

## 2017-11-07 DIAGNOSIS — L0390 Cellulitis, unspecified: Secondary | ICD-10-CM | POA: Diagnosis present

## 2017-11-07 DIAGNOSIS — F0390 Unspecified dementia without behavioral disturbance: Secondary | ICD-10-CM

## 2017-11-07 DIAGNOSIS — Z91018 Allergy to other foods: Secondary | ICD-10-CM | POA: Diagnosis not present

## 2017-11-07 DIAGNOSIS — Z91030 Bee allergy status: Secondary | ICD-10-CM | POA: Diagnosis not present

## 2017-11-07 DIAGNOSIS — N138 Other obstructive and reflux uropathy: Secondary | ICD-10-CM | POA: Diagnosis present

## 2017-11-07 DIAGNOSIS — Q548 Other hypospadias: Secondary | ICD-10-CM

## 2017-11-07 DIAGNOSIS — G2581 Restless legs syndrome: Secondary | ICD-10-CM | POA: Diagnosis present

## 2017-11-07 DIAGNOSIS — N4822 Cellulitis of corpus cavernosum and penis: Secondary | ICD-10-CM

## 2017-11-07 DIAGNOSIS — N3289 Other specified disorders of bladder: Secondary | ICD-10-CM | POA: Diagnosis not present

## 2017-11-07 DIAGNOSIS — M75111 Incomplete rotator cuff tear or rupture of right shoulder, not specified as traumatic: Secondary | ICD-10-CM | POA: Diagnosis present

## 2017-11-07 DIAGNOSIS — N1330 Unspecified hydronephrosis: Secondary | ICD-10-CM | POA: Diagnosis not present

## 2017-11-07 DIAGNOSIS — L03818 Cellulitis of other sites: Secondary | ICD-10-CM | POA: Diagnosis not present

## 2017-11-07 DIAGNOSIS — N183 Chronic kidney disease, stage 3 (moderate): Secondary | ICD-10-CM | POA: Diagnosis present

## 2017-11-07 DIAGNOSIS — N136 Pyonephrosis: Secondary | ICD-10-CM | POA: Diagnosis present

## 2017-11-07 DIAGNOSIS — Z87891 Personal history of nicotine dependence: Secondary | ICD-10-CM | POA: Diagnosis not present

## 2017-11-07 DIAGNOSIS — N320 Bladder-neck obstruction: Secondary | ICD-10-CM | POA: Diagnosis not present

## 2017-11-07 DIAGNOSIS — D631 Anemia in chronic kidney disease: Secondary | ICD-10-CM | POA: Diagnosis present

## 2017-11-07 DIAGNOSIS — M25511 Pain in right shoulder: Secondary | ICD-10-CM | POA: Diagnosis not present

## 2017-11-07 DIAGNOSIS — I700 Atherosclerosis of aorta: Secondary | ICD-10-CM | POA: Diagnosis not present

## 2017-11-07 DIAGNOSIS — Z7982 Long term (current) use of aspirin: Secondary | ICD-10-CM | POA: Diagnosis not present

## 2017-11-07 DIAGNOSIS — Z880 Allergy status to penicillin: Secondary | ICD-10-CM | POA: Diagnosis not present

## 2017-11-07 DIAGNOSIS — Z87442 Personal history of urinary calculi: Secondary | ICD-10-CM | POA: Diagnosis not present

## 2017-11-07 DIAGNOSIS — Z7951 Long term (current) use of inhaled steroids: Secondary | ICD-10-CM | POA: Diagnosis not present

## 2017-11-07 DIAGNOSIS — M6281 Muscle weakness (generalized): Secondary | ICD-10-CM | POA: Diagnosis not present

## 2017-11-07 DIAGNOSIS — R0902 Hypoxemia: Secondary | ICD-10-CM | POA: Diagnosis present

## 2017-11-07 DIAGNOSIS — J449 Chronic obstructive pulmonary disease, unspecified: Secondary | ICD-10-CM | POA: Diagnosis present

## 2017-11-07 DIAGNOSIS — K219 Gastro-esophageal reflux disease without esophagitis: Secondary | ICD-10-CM | POA: Diagnosis present

## 2017-11-07 DIAGNOSIS — G8929 Other chronic pain: Secondary | ICD-10-CM | POA: Diagnosis present

## 2017-11-07 DIAGNOSIS — R1312 Dysphagia, oropharyngeal phase: Secondary | ICD-10-CM | POA: Diagnosis not present

## 2017-11-07 DIAGNOSIS — M7521 Bicipital tendinitis, right shoulder: Secondary | ICD-10-CM | POA: Diagnosis not present

## 2017-11-07 DIAGNOSIS — R41841 Cognitive communication deficit: Secondary | ICD-10-CM | POA: Diagnosis not present

## 2017-11-07 DIAGNOSIS — T83011A Breakdown (mechanical) of indwelling urethral catheter, initial encounter: Secondary | ICD-10-CM | POA: Diagnosis present

## 2017-11-07 DIAGNOSIS — A419 Sepsis, unspecified organism: Secondary | ICD-10-CM | POA: Diagnosis not present

## 2017-11-07 LAB — CBC WITH DIFFERENTIAL/PLATELET
BASOS ABS: 0 10*3/uL (ref 0.0–0.1)
Basophils Relative: 0 %
Eosinophils Absolute: 0.3 10*3/uL (ref 0.0–0.7)
Eosinophils Relative: 2 %
HEMATOCRIT: 32.4 % — AB (ref 39.0–52.0)
HEMOGLOBIN: 10.2 g/dL — AB (ref 13.0–17.0)
LYMPHS PCT: 7 %
Lymphs Abs: 0.9 10*3/uL (ref 0.7–4.0)
MCH: 29.9 pg (ref 26.0–34.0)
MCHC: 31.5 g/dL (ref 30.0–36.0)
MCV: 95 fL (ref 78.0–100.0)
Monocytes Absolute: 1.4 10*3/uL — ABNORMAL HIGH (ref 0.1–1.0)
Monocytes Relative: 10 %
NEUTROS ABS: 11.1 10*3/uL — AB (ref 1.7–7.7)
NEUTROS PCT: 81 %
Platelets: 292 10*3/uL (ref 150–400)
RBC: 3.41 MIL/uL — ABNORMAL LOW (ref 4.22–5.81)
RDW: 15.3 % (ref 11.5–15.5)
WBC: 13.7 10*3/uL — AB (ref 4.0–10.5)

## 2017-11-07 LAB — URINALYSIS, ROUTINE W REFLEX MICROSCOPIC
Bilirubin Urine: NEGATIVE
GLUCOSE, UA: NEGATIVE mg/dL
HGB URINE DIPSTICK: NEGATIVE
KETONES UR: NEGATIVE mg/dL
Leukocytes, UA: NEGATIVE
Nitrite: NEGATIVE
PH: 5 (ref 5.0–8.0)
PROTEIN: NEGATIVE mg/dL
Specific Gravity, Urine: 1.016 (ref 1.005–1.030)

## 2017-11-07 LAB — COMPREHENSIVE METABOLIC PANEL
ALT: 12 U/L (ref 0–44)
ANION GAP: 8 (ref 5–15)
AST: 15 U/L (ref 15–41)
Albumin: 3 g/dL — ABNORMAL LOW (ref 3.5–5.0)
Alkaline Phosphatase: 75 U/L (ref 38–126)
BUN: 41 mg/dL — AB (ref 8–23)
CO2: 23 mmol/L (ref 22–32)
Calcium: 8.5 mg/dL — ABNORMAL LOW (ref 8.9–10.3)
Chloride: 106 mmol/L (ref 98–111)
Creatinine, Ser: 2.28 mg/dL — ABNORMAL HIGH (ref 0.61–1.24)
GFR, EST AFRICAN AMERICAN: 30 mL/min — AB (ref >60)
GFR, EST NON AFRICAN AMERICAN: 26 mL/min — AB (ref >60)
Glucose, Bld: 115 mg/dL — ABNORMAL HIGH (ref 70–99)
POTASSIUM: 4.2 mmol/L (ref 3.5–5.1)
Sodium: 137 mmol/L (ref 135–145)
Total Bilirubin: 0.4 mg/dL (ref 0.3–1.2)
Total Protein: 8 g/dL (ref 6.5–8.1)

## 2017-11-07 LAB — MRSA PCR SCREENING: MRSA by PCR: NEGATIVE

## 2017-11-07 LAB — I-STAT CG4 LACTIC ACID, ED: LACTIC ACID, VENOUS: 0.74 mmol/L (ref 0.5–1.9)

## 2017-11-07 MED ORDER — POLYETHYLENE GLYCOL 3350 17 G PO PACK
17.0000 g | PACK | Freq: Every day | ORAL | Status: DC
  Administered 2017-11-08 – 2017-11-11 (×4): 17 g via ORAL
  Filled 2017-11-07 (×5): qty 1

## 2017-11-07 MED ORDER — MOMETASONE FURO-FORMOTEROL FUM 200-5 MCG/ACT IN AERO
2.0000 | INHALATION_SPRAY | Freq: Two times a day (BID) | RESPIRATORY_TRACT | Status: DC
  Administered 2017-11-07 – 2017-11-11 (×9): 2 via RESPIRATORY_TRACT
  Filled 2017-11-07: qty 8.8

## 2017-11-07 MED ORDER — OXYBUTYNIN CHLORIDE ER 5 MG PO TB24
10.0000 mg | ORAL_TABLET | Freq: Every day | ORAL | Status: DC
  Administered 2017-11-07 – 2017-11-10 (×4): 10 mg via ORAL
  Filled 2017-11-07 (×5): qty 2

## 2017-11-07 MED ORDER — ONDANSETRON HCL 4 MG/2ML IJ SOLN: 4.0000 mg | Freq: Four times a day (QID) | INTRAMUSCULAR | Status: DC | PRN

## 2017-11-07 MED ORDER — TRAZODONE HCL 50 MG PO TABS
200.0000 mg | ORAL_TABLET | Freq: Every day | ORAL | Status: DC
  Administered 2017-11-07 – 2017-11-10 (×4): 200 mg via ORAL
  Filled 2017-11-07 (×5): qty 4

## 2017-11-07 MED ORDER — NYSTATIN 100000 UNIT/GM EX POWD
Freq: Two times a day (BID) | CUTANEOUS | Status: DC
  Administered 2017-11-07 – 2017-11-11 (×10): via TOPICAL
  Filled 2017-11-07 (×3): qty 15

## 2017-11-07 MED ORDER — SODIUM CHLORIDE 0.9 % IV SOLN
1.0000 g | Freq: Two times a day (BID) | INTRAVENOUS | Status: DC
  Administered 2017-11-07 – 2017-11-08 (×3): 1 g via INTRAVENOUS
  Filled 2017-11-07 (×6): qty 1

## 2017-11-07 MED ORDER — SODIUM CHLORIDE 0.9 % IV SOLN
INTRAVENOUS | Status: DC
  Administered 2017-11-07 – 2017-11-10 (×5): via INTRAVENOUS

## 2017-11-07 MED ORDER — TAMSULOSIN HCL 0.4 MG PO CAPS
0.4000 mg | ORAL_CAPSULE | Freq: Every day | ORAL | Status: DC
  Administered 2017-11-07 – 2017-11-11 (×5): 0.4 mg via ORAL
  Filled 2017-11-07 (×6): qty 1

## 2017-11-07 MED ORDER — SERTRALINE HCL 50 MG PO TABS
50.0000 mg | ORAL_TABLET | Freq: Every day | ORAL | Status: DC
  Administered 2017-11-07 – 2017-11-11 (×5): 50 mg via ORAL
  Filled 2017-11-07 (×5): qty 1

## 2017-11-07 MED ORDER — GABAPENTIN 400 MG PO CAPS: 400.0000 mg | ORAL_CAPSULE | Freq: Three times a day (TID) | ORAL | Status: DC

## 2017-11-07 MED ORDER — ALBUTEROL SULFATE (2.5 MG/3ML) 0.083% IN NEBU: 2.5000 mg | INHALATION_SOLUTION | RESPIRATORY_TRACT | Status: DC | PRN

## 2017-11-07 MED ORDER — BUPROPION HCL ER (SR) 150 MG PO TB12
150.0000 mg | ORAL_TABLET | Freq: Two times a day (BID) | ORAL | Status: DC
  Administered 2017-11-07 – 2017-11-11 (×9): 150 mg via ORAL
  Filled 2017-11-07 (×9): qty 1

## 2017-11-07 MED ORDER — MEMANTINE HCL 10 MG PO TABS
5.0000 mg | ORAL_TABLET | Freq: Two times a day (BID) | ORAL | Status: DC
  Administered 2017-11-07 – 2017-11-11 (×9): 5 mg via ORAL
  Filled 2017-11-07 (×9): qty 1

## 2017-11-07 MED ORDER — ASPIRIN EC 81 MG PO TBEC
81.0000 mg | DELAYED_RELEASE_TABLET | Freq: Every day | ORAL | Status: DC
  Administered 2017-11-07 – 2017-11-11 (×5): 81 mg via ORAL
  Filled 2017-11-07 (×5): qty 1

## 2017-11-07 MED ORDER — PRAMIPEXOLE DIHYDROCHLORIDE 0.25 MG PO TABS
0.1250 mg | ORAL_TABLET | Freq: Every day | ORAL | Status: DC
  Administered 2017-11-07 – 2017-11-10 (×4): 0.125 mg via ORAL
  Filled 2017-11-07: qty 0.5
  Filled 2017-11-07 (×2): qty 1
  Filled 2017-11-07: qty 0.5
  Filled 2017-11-07: qty 1
  Filled 2017-11-07: qty 0.5
  Filled 2017-11-07: qty 1

## 2017-11-07 MED ORDER — FLUCONAZOLE 100 MG PO TABS
100.0000 mg | ORAL_TABLET | Freq: Every day | ORAL | Status: DC
  Administered 2017-11-07 – 2017-11-08 (×2): 100 mg via ORAL
  Filled 2017-11-07 (×2): qty 1

## 2017-11-07 MED ORDER — ENOXAPARIN SODIUM 30 MG/0.3ML ~~LOC~~ SOLN
30.0000 mg | SUBCUTANEOUS | Status: DC
  Administered 2017-11-07 – 2017-11-09 (×3): 30 mg via SUBCUTANEOUS
  Filled 2017-11-07 (×3): qty 0.3

## 2017-11-07 MED ORDER — GABAPENTIN 400 MG PO CAPS
400.0000 mg | ORAL_CAPSULE | Freq: Two times a day (BID) | ORAL | Status: DC
  Administered 2017-11-07 – 2017-11-11 (×9): 400 mg via ORAL
  Filled 2017-11-07 (×9): qty 1

## 2017-11-07 MED ORDER — ENOXAPARIN SODIUM 40 MG/0.4ML ~~LOC~~ SOLN: 40.0000 mg | SUBCUTANEOUS | Status: DC

## 2017-11-07 MED ORDER — ONDANSETRON HCL 4 MG PO TABS: 4.0000 mg | ORAL_TABLET | Freq: Four times a day (QID) | ORAL | Status: DC | PRN

## 2017-11-07 MED ORDER — IPRATROPIUM BROMIDE 0.02 % IN SOLN: 0.5000 mg | Freq: Four times a day (QID) | RESPIRATORY_TRACT | Status: DC | PRN

## 2017-11-07 MED ORDER — PANTOPRAZOLE SODIUM 40 MG PO TBEC
40.0000 mg | DELAYED_RELEASE_TABLET | Freq: Two times a day (BID) | ORAL | Status: DC
  Administered 2017-11-07 – 2017-11-11 (×9): 40 mg via ORAL
  Filled 2017-11-07 (×9): qty 1

## 2017-11-07 MED ORDER — ACETAMINOPHEN 500 MG PO TABS
500.0000 mg | ORAL_TABLET | Freq: Three times a day (TID) | ORAL | Status: DC | PRN
  Administered 2017-11-08 – 2017-11-09 (×2): 500 mg via ORAL
  Filled 2017-11-07 (×2): qty 1

## 2017-11-07 MED ORDER — VANCOMYCIN HCL 500 MG IV SOLR
500.0000 mg | INTRAVENOUS | Status: DC
  Administered 2017-11-07: 500 mg via INTRAVENOUS
  Filled 2017-11-07 (×2): qty 500

## 2017-11-07 MED ORDER — ORAL CARE MOUTH RINSE
15.0000 mL | Freq: Two times a day (BID) | OROMUCOSAL | Status: DC
  Administered 2017-11-07 – 2017-11-11 (×4): 15 mL via OROMUCOSAL

## 2017-11-07 NOTE — H&P (Addendum)
[facility] H&P    Patient Demographics:    Anthony [name], is a 77 y.o. male  MRN: [id number]  DOB - 04-Oct-1940  Admit Date - 11/06/2017  Referring MD/NP/PA: Dr. Roxanne Mins  Outpatient Primary MD for the patient is Rosita Fire, MD  Patient coming from: Skilled nursing facility  Chief complaint-redness around perineum   HPI:    Gar [name]  is a 77 y.o. male, with history of CKD stage III, hydronephrosis status post  Foley catheter insertion 3 weeks ago, COPD, dementia was brought to the hospital from skilled nursing facility for redness and excoriation around genital area.  Patient had Foley catheter placement as above 3 weeks ago and with urine leaking around Foley catheter patient developed redness around the genital area. Patient is a poor historian given dementia He denies any pain. In the ED, lactic acid 0.74. WBC 13.7    Review of systems:    Review of systems unobtainable as patient has underlying dementia   With Past History of the following :    Past Medical History:  Diagnosis Date  . Acute pyelonephritis 12/23/2014  . Asthma   . BPH (benign prostatic hyperplasia)   . Chronic back pain   . Chronic hip pain   . CKD (chronic kidney disease)   . COPD (chronic obstructive pulmonary disease) ([place])   . Dementia   . Depression   . GERD (gastroesophageal reflux disease)   . Kidney stone   . Neuropathy   . Pulmonary nodules 12/25/2014  . Restless leg syndrome       Past Surgical History:  Procedure Laterality Date  . ABDOMINAL SURGERY     heria  . ESOPHAGOGASTRODUODENOSCOPY N/A 07/06/2017   Procedure: ESOPHAGOGASTRODUODENOSCOPY (EGD);  Surgeon: Rogene Houston, MD;  [facility];  Service: Endoscopy;  Laterality: N/A;  . EYE SURGERY        Social History:      Social History   Tobacco Use  . Smoking status: Former Smoker    Packs/day: 0.50    Types: Cigarettes    Last  attempt to quit: 06/09/2017    Years since quitting: 0.4  . Smokeless tobacco: Never Used  Substance Use Topics  . Alcohol use: No       Family History :     Family history not obtainable due to patient's underlying dementia   Home Medications:   Prior to Admission medications   Medication Sig Start Date End Date Taking? Authorizing Provider  acetaminophen (TYLENOL) 500 MG tablet Take 500 mg by mouth 3 (three) times daily as needed for mild pain.     [provider]  albuterol (PROVENTIL HFA;VENTOLIN HFA) 108 (90 BASE) MCG/ACT inhaler Inhale 2 puffs into the lungs every 4 (four) hours as needed for wheezing or shortness of breath.    [provider]  albuterol (PROVENTIL) (2.5 MG/3ML) 0.083% nebulizer solution Take 3 mLs (2.5 mg total) by nebulization every 4 (four) hours as needed for wheezing or shortness of breath. Patient taking differently: Take 2.5 mg by nebulization 4 (  four) times daily.  07/07/17   Johnson, Clanford L, MD  aspirin EC 81 MG tablet Take 81 mg by mouth daily.    [provider]  baclofen (LIORESAL) 10 MG tablet Take 10 mg by mouth 2 (two) times daily.    [provider]  buPROPion (WELLBUTRIN SR) 150 MG 12 hr tablet Take 150 mg by mouth 2 (two) times daily.    [provider]  Calcium Citrate-Vitamin D (CALCIUM CITRATE+D3 PETITES) 200-250 MG-UNIT TABS Take 1 tablet by mouth daily.    [provider]  cholecalciferol (VITAMIN D) 1000 units tablet Take 2,000 Units by mouth daily.    [provider]  doxycycline (VIBRAMYCIN) 100 MG capsule Take 1 capsule (100 mg total) by mouth 2 (two) times daily. 10/23/17   Nat Christen, MD  fluconazole (DIFLUCAN) 100 MG tablet Take 1 tablet (100 mg total) by mouth daily. 10/23/17   Nat Christen, MD  fluticasone [facility]) 50 MCG/ACT nasal spray Place 1 spray 2 (two) times daily into both nostrils.     [provider]  Fluticasone-Salmeterol (ADVAIR) 250-50 MCG/DOSE  AEPB Inhale 1 puff 2 (two) times daily into the lungs.    [provider]  gabapentin (NEURONTIN) 400 MG capsule Take 400 mg by mouth 3 (three) times daily.    [provider]  ipratropium (ATROVENT) 0.02 % nebulizer solution Take 2.5 mLs (0.5 mg total) by nebulization every 6 (six) hours as needed for wheezing or shortness of breath. Patient taking differently: Take 0.5 mg by nebulization 4 (four) times daily.  07/07/17   Johnson, Clanford L, MD  memantine (NAMENDA) 5 MG tablet Take 1 tablet (5 mg total) by mouth 2 (two) times daily. 10/14/17   Kathie Dike, MD  nitrofurantoin (MACRODANTIN) 100 MG capsule Take 100 mg by mouth every 6 (six) hours. Starting 10/17/2017 x 5 days.    [provider]  pantoprazole (PROTONIX) 40 MG tablet Take 1 tablet (40 mg total) by mouth 2 (two) times daily before a meal. 07/07/17   Johnson, Clanford L, MD  polyethylene glycol (MIRALAX) packet Take 17 g by mouth daily. 10/14/17   Kathie Dike, MD  pramipexole (MIRAPEX) 0.125 MG tablet Take 0.125 mg by mouth at bedtime.    [provider]  sennosides-docusate sodium (SENOKOT-S) 8.6-50 MG tablet Take 1 tablet by mouth daily as needed for constipation.    [provider]  sertraline (ZOLOFT) 50 MG tablet Take 50 mg by mouth daily.    [provider]  sulfamethoxazole-trimethoprim (BACTRIM DS,SEPTRA DS) 800-160 MG tablet Take 1 tablet by mouth 2 (two) times daily. Starting on 10/16/2017 x 7 days.    [provider]  tamsulosin (FLOMAX) 0.4 MG CAPS capsule Take 0.4 mg by mouth daily.     [provider]  traMADol (ULTRAM) 50 MG tablet Take 1 tablet (50 mg total) by mouth every 6 (six) hours as needed for severe pain. Patient taking differently: Take 50 mg by mouth 2 (two) times daily.  07/07/17   Johnson, Clanford L, MD  traZODone (DESYREL) 100 MG tablet Take 200 mg by mouth at bedtime.    [provider]  triamcinolone ointment (KENALOG) 0.5 %  Apply 1 application topically 2 (two) times daily. Apply to affected areas of forearms twice daily.    [provider]  vitamin B-12 (CYANOCOBALAMIN) 1000 MCG tablet Take 1,000 mcg by mouth daily.    [provider]  Vitamin D, Ergocalciferol, (DRISDOL) 50000 units CAPS capsule Take 50,000  Units by mouth every Friday.     [provider]     Allergies:   Penicillins   Physical Exam:   Vitals  Blood pressure 121/75, pulse 72, temperature 98 F (36.7 C), temperature source Oral, resp. rate 18, height 6\' 4"  (1.93 m), weight 70.8 kg, SpO2 90 %.  1.  General: Appears in no acute distress  2. Psychiatric:  Intact judgement and  insight, awake alert, oriented x 3.  3. Neurologic: No focal neurological deficits, all cranial nerves intact.Strength 5/5 all 4 extremities, sensation intact all 4 extremities, plantars down going.  4. Eyes :  anicteric sclerae, moist conjunctivae with no lid lag. PERRLA.  5. ENMT:  Oropharynx clear with moist mucous membranes and good dentition  6. Neck:  supple, no cervical lymphadenopathy appriciated, No thyromegaly  7. Respiratory : Normal respiratory effort, good air movement bilaterally,clear to  auscultation bilaterally  8. Cardiovascular : RRR, no gallops, rubs or murmurs, no leg edema  9. Gastrointestinal:  Positive bowel sounds, abdomen soft, non-tender to palpation,no hepatosplenomegaly, no rigidity or guarding       10. Skin:  Mild erythema noted around the penis, scrotum, perineum  Genitourinary Foley catheter placed, erythema and swelling of the penis, Foley catheter appears to have eroded through the posterior aspect of urethra, purulent discharge noted around the glans.    Data Review:    CBC Recent Labs  Lab 11/07/17 0010  WBC 13.7*  HGB 10.2*  HCT 32.4*  PLT 292  MCV 95.0  MCH 29.9  MCHC 31.5  RDW 15.3  LYMPHSABS 0.9  MONOABS 1.4*  EOSABS 0.3  BASOSABS 0.0    ------------------------------------------------------------------------------------------------------------------  Chemistries  Recent Labs  Lab 11/07/17 0010  NA 137  K 4.2  CL 106  CO2 23  GLUCOSE 115*  BUN 41*  CREATININE 2.28*  CALCIUM 8.5*  AST 15  ALT 12  ALKPHOS 75  BILITOT 0.4   ------------------------------------------------------------------------------------------------------------------  ------------------------------------------------------------------------------------------------------------------ GFR: Estimated Creatinine Clearance: 26.3 mL/min (A) (by C-G formula based on SCr of 2.28 mg/dL (H)). Liver Function Tests: Recent Labs  Lab 11/07/17 0010  AST 15  ALT 12  ALKPHOS 75  BILITOT 0.4  PROT 8.0  ALBUMIN 3.0*    --------------------------------------------------------------------------------------------------------------- Urine analysis:    Component Value Date/Time   COLORURINE YELLOW 11/07/2017 0225   APPEARANCEUR CLOUDY (A) 11/07/2017 0225   LABSPEC 1.016 11/07/2017 0225   PHURINE 5.0 11/07/2017 0225   GLUCOSEU NEGATIVE 11/07/2017 0225   HGBUR NEGATIVE 11/07/2017 0225   BILIRUBINUR NEGATIVE 11/07/2017 0225   KETONESUR NEGATIVE 11/07/2017 0225   PROTEINUR NEGATIVE 11/07/2017 0225   UROBILINOGEN 1.0 12/23/2014 1853   NITRITE NEGATIVE 11/07/2017 0225   LEUKOCYTESUR NEGATIVE 11/07/2017 0225      Imaging Results:    Dg Chest Port 1 View  Result Date: 11/07/2017 CLINICAL DATA:  Sepsis EXAM: PORTABLE CHEST 1 VIEW COMPARISON:  10/29/2017 FINDINGS: Chronic changes/scarring in the lungs bilaterally. Heart is normal size. No acute confluent airspace opacities, effusions or edema. No acute bony abnormality. IMPRESSION: Chronic changes.  No active disease. Electronically Signed   By: Rolm Baptise M.D.   On: 11/07/2017 00:34    My personal review of EKG: Rhythm sinus rhythm   Assessment & Plan:    Active Problems:   CKD (chronic kidney  disease) stage 3, GFR 30-59 ml/min (HCC)   Dementia   Cellulitis   1. Cellulitis-patient started on vancomycin, Azactam.  Follow blood cultures x2.  Also started on nystatin powder twice a  day 2. Foley catheter/rupture through posterior urethra/hypospadias-patient will need suprapubic catheter, will consult urology 3. Dementia-stable, continue Namenda 4. COPD-stable, continue albuterol, ipratropium as needed, Advair 5. CKD stage III-creatinine 2.28, at baseline.    DVT Prophylaxis-   Lovenox   AM Labs Ordered, also please review Full Orders  Family Communication: Admission, patients condition and plan of care including tests being ordered have been discussed with the patient who indicate understanding and agree with the plan and Code Status.  Code Status: Full code  Admission status: Inpatient  Time spent in minutes : 60 minutes   Oswald Hillock M.D on 11/07/2017 at 3:53 AM  Between 7am to 7pm - Pager - [phone]. After 7pm go to [email or web address] - password [facility]  [facility] - Office  [phone]

## 2017-11-07 NOTE — Consult Note (Signed)
Urology Consult   Physician requesting consult: Eleonore Chiquito, MD  Reason for consult: Traumatic hypospadias  History of Present Illness: Anthony [name] is a 77 y.o. male with dementia, COPD, CKD III, UTI and urinary retention who presented to the [facility] on 11/07/17 after a fall at his SNF.  The patient was seen on 10/13/17 after another fall from standing and was found to have bilateral hydronephrosis on CT secondary to bladder outlet obstruction, which prompted Foley placement at that time.  No f/u imaging has been obtained to assess his hydronephrosis.  Baseline creatinine appears to be around 2.5 (last creatinine was 2.2).  According to the nursing staff, his catheter has not been exchanged for 3 weeks.  Urology has been consulted to assess the patient for penoscrotal cellulitis, leakage around his catheter and for a possible supra-pubic tube due to traumatic hypospadias.   The patient has a history of dementia and has no family at bedside and is unable to clearly answer historical questions.   Past Medical History:  Diagnosis Date  . Acute pyelonephritis 12/23/2014  . Asthma   . BPH (benign prostatic hyperplasia)   . Chronic back pain   . Chronic hip pain   . CKD (chronic kidney disease)   . COPD (chronic obstructive pulmonary disease) ([place])   . Dementia   . Depression   . GERD (gastroesophageal reflux disease)   . Kidney stone   . Neuropathy   . Pulmonary nodules 12/25/2014  . Restless leg syndrome     Past Surgical History:  Procedure Laterality Date  . ABDOMINAL SURGERY     heria  . ESOPHAGOGASTRODUODENOSCOPY N/A 07/06/2017   Procedure: ESOPHAGOGASTRODUODENOSCOPY (EGD);  Surgeon: Rogene Houston, MD;  Location: [facility];  Service: Endoscopy;  Laterality: N/A;  . EYE SURGERY      Current Hospital Medications:  Home Meds:  No outpatient medications have been marked as taking for the 11/06/17 encounter [facility] Encounter).    Scheduled Meds: . aspirin EC  81 mg Oral Daily   . buPROPion  150 mg Oral BID  . enoxaparin (LOVENOX) injection  30 mg Subcutaneous Q24H  . fluconazole  100 mg Oral Daily  . gabapentin  400 mg Oral BID  . mouth rinse  15 mL Mouth Rinse BID  . memantine  5 mg Oral BID  . mometasone-formoterol  2 puff Inhalation BID  . nystatin   Topical BID  . pantoprazole  40 mg Oral BID AC  . polyethylene glycol  17 g Oral Daily  . pramipexole  0.125 mg Oral QHS  . sertraline  50 mg Oral Daily  . tamsulosin  0.4 mg Oral Daily  . traZODone  200 mg Oral QHS   Continuous Infusions: . sodium chloride 75 mL/hr at 11/07/17 0544  . aztreonam 1 g (11/07/17 2008)  . vancomycin Stopped (11/07/17 1913)   PRN Meds:.acetaminophen, albuterol, ipratropium, ondansetron **OR** ondansetron (ZOFRAN) IV  Allergies:  Penicillin, bee venom  Family History  Family history unknown: Yes    Social History:  reports that he quit smoking about 4 months ago. His smoking use included cigarettes. He smoked 0.50 packs per day. He has never used smokeless tobacco. He reports that he does not drink alcohol or use drugs.  ROS: Unable to obtain  Physical Exam:  Vital signs in last 24 hours: Temp:  [97.5 F (36.4 C)-98.1 F (36.7 C)] 98 F (36.7 C) (08/26 1923) Pulse Rate:  [72-77] 73 (08/26 1923) Resp:  [13-18] 16 (08/26  1608) BP: (100-122)/(48-84) 110/52 (08/26 1923) SpO2:  [86 %-100 %] 100 % (08/26 1923) Weight:  [70.8 kg] 70.8 kg (08/25 2314) Constitutional:  Oriented to place and person, but not time Cardiovascular: Regular rate and rhythm, No JVD Respiratory: Normal respiratory effort, bilateral exp wheezes in all fields GI: Abdomen is soft, nontender, nondistended, no abdominal masses GU:  The penile foreskin is edematous with uniform mild erythema extending down into the scrotum.  No peno-scrotal eschar, necrosis or crepitus.  The ventral, distal urethral exhibits a traumatic hypospadias extending ~ 2 cm proximally.  There is fibrinous and purulent debris  surrounding the ventral foreskin.  The genitalia are tender to palpation.  Both testicles are descended and exhibit no masses or nodules. Foley catheter in place and draining cloudy urine.  Lymphatic: No lymphadenopathy Neurologic: Grossly intact, no focal deficits Psychiatric: Normal mood and affect   Laboratory Data:  Recent Labs    11/07/17 0010  WBC 13.7*  HGB 10.2*  HCT 32.4*  PLT 292    Recent Labs    11/07/17 0010  NA 137  K 4.2  CL 106  GLUCOSE 115*  BUN 41*  CALCIUM 8.5*  CREATININE 2.28*     Results for orders placed or performed during the hospital encounter of 11/06/17 (from the past 24 hour(s))  Comprehensive metabolic panel     Status: Abnormal   Collection Time: 11/07/17 12:10 AM  Result Value Ref Range   Sodium 137 135 - 145 mmol/L   Potassium 4.2 3.5 - 5.1 mmol/L   Chloride 106 98 - 111 mmol/L   CO2 23 22 - 32 mmol/L   Glucose, Bld 115 (H) 70 - 99 mg/dL   BUN 41 (H) 8 - 23 mg/dL   Creatinine, Ser 2.28 (H) 0.61 - 1.24 mg/dL   Calcium 8.5 (L) 8.9 - 10.3 mg/dL   Total Protein 8.0 6.5 - 8.1 g/dL   Albumin 3.0 (L) 3.5 - 5.0 g/dL   AST 15 15 - 41 U/L   ALT 12 0 - 44 U/L   Alkaline Phosphatase 75 38 - 126 U/L   Total Bilirubin 0.4 0.3 - 1.2 mg/dL   GFR calc non Af Amer 26 (L) >60 mL/min   GFR calc Af Amer 30 (L) >60 mL/min   Anion gap 8 5 - 15  CBC WITH DIFFERENTIAL     Status: Abnormal   Collection Time: 11/07/17 12:10 AM  Result Value Ref Range   WBC 13.7 (H) 4.0 - 10.5 K/uL   RBC 3.41 (L) 4.22 - 5.81 MIL/uL   Hemoglobin 10.2 (L) 13.0 - 17.0 g/dL   HCT 32.4 (L) 39.0 - 52.0 %   MCV 95.0 78.0 - 100.0 fL   MCH 29.9 26.0 - 34.0 pg   MCHC 31.5 30.0 - 36.0 g/dL   RDW 15.3 11.5 - 15.5 %   Platelets 292 150 - 400 K/uL   Neutrophils Relative % 81 %   Neutro Abs 11.1 (H) 1.7 - 7.7 K/uL   Lymphocytes Relative 7 %   Lymphs Abs 0.9 0.7 - 4.0 K/uL   Monocytes Relative 10 %   Monocytes Absolute 1.4 (H) 0.1 - 1.0 K/uL   Eosinophils Relative 2 %    Eosinophils Absolute 0.3 0.0 - 0.7 K/uL   Basophils Relative 0 %   Basophils Absolute 0.0 0.0 - 0.1 K/uL  Blood Culture (routine x 2)     Status: None (Preliminary result)   Collection Time: 11/07/17 12:10 AM  Result Value Ref  Range   Specimen Description BLOOD RIGHT ARM    Special Requests      BOTTLES DRAWN AEROBIC AND ANAEROBIC Blood Culture adequate volume   Culture      NO GROWTH < 12 HOURS Performed at [facility], [address] [place], [place] [address]    Report Status PENDING   Blood Culture (routine x 2)     Status: None (Preliminary result)   Collection Time: 11/07/17 12:25 AM  Result Value Ref Range   Specimen Description LEFT ANTECUBITAL    Special Requests      BOTTLES DRAWN AEROBIC AND ANAEROBIC Blood Culture results may not be optimal due to an excessive volume of blood received in culture bottles   Culture      NO GROWTH < 12 HOURS Performed at [facility], [address], [place], [place] [address]    Report Status PENDING   I-Stat CG4 Lactic Acid, ED  (not at  [facility])     Status: None   Collection Time: 11/07/17 12:26 AM  Result Value Ref Range   Lactic Acid, Venous 0.74 0.5 - 1.9 mmol/L  Urinalysis, Routine w reflex microscopic     Status: Abnormal   Collection Time: 11/07/17  2:25 AM  Result Value Ref Range   Color, Urine YELLOW YELLOW   APPearance CLOUDY (A) CLEAR   Specific Gravity, Urine 1.016 1.005 - 1.030   pH 5.0 5.0 - 8.0   Glucose, UA NEGATIVE NEGATIVE mg/dL   Hgb urine dipstick NEGATIVE NEGATIVE   Bilirubin Urine NEGATIVE NEGATIVE   Ketones, ur NEGATIVE NEGATIVE mg/dL   Protein, ur NEGATIVE NEGATIVE mg/dL   Nitrite NEGATIVE NEGATIVE   Leukocytes, UA NEGATIVE NEGATIVE  Wound or Superficial Culture     Status: None (Preliminary result)   Collection Time: 11/07/17  2:25 AM  Result Value Ref Range   Specimen Description      PENIS Performed at [facility], [address], [place], [place] [address]    Special Requests PROCESS AS A WOUND  CULTURE PER MD    Gram Stain      RARE WBC PRESENT, PREDOMINANTLY PMN ABUNDANT GRAM POSITIVE COCCI FEW GRAM NEGATIVE RODS FEW GRAM POSITIVE RODS Performed at [facility], [place] [address], [place], [place] [address]    Culture PENDING    Report Status PENDING    Recent Results (from the past 240 hour(s))  Blood Culture (routine x 2)     Status: None (Preliminary result)   Collection Time: 11/07/17 12:10 AM  Result Value Ref Range Status   Specimen Description BLOOD RIGHT ARM  Final   Special Requests   Final    BOTTLES DRAWN AEROBIC AND ANAEROBIC Blood Culture adequate volume   Culture   Final    NO GROWTH < 12 HOURS Performed at [facility], [address], [place], [place] [address]    Report Status PENDING  Incomplete  Blood Culture (routine x 2)     Status: None (Preliminary result)   Collection Time: 11/07/17 12:25 AM  Result Value Ref Range Status   Specimen Description LEFT ANTECUBITAL  Final   Special Requests   Final    BOTTLES DRAWN AEROBIC AND ANAEROBIC Blood Culture results may not be optimal due to an excessive volume of blood received in culture bottles   Culture   Final    NO GROWTH < 12 HOURS Performed at [facility], [address], [place], [place] [address]    Report Status PENDING  Incomplete  Wound or Superficial Culture     Status: None (Preliminary result)   Collection Time: 11/07/17  2:25 AM  Result Value Ref Range Status   Specimen Description   Final    PENIS Performed at [facility], [address], [place], [place] [address]    Special Requests PROCESS AS A WOUND CULTURE PER MD  Final   Gram Stain   Final    RARE WBC PRESENT, PREDOMINANTLY PMN ABUNDANT GRAM POSITIVE COCCI FEW GRAM NEGATIVE RODS FEW GRAM POSITIVE RODS Performed at [facility], [place] [address] [place], [place] [address]    Culture PENDING  Incomplete   Report Status PENDING  Incomplete    Renal Function: Recent Labs    11/07/17 0010  CREATININE 2.28*    Estimated Creatinine Clearance: 26.3 mL/min (A) (by C-G formula based on SCr of 2.28 mg/dL (H)).  Radiologic Imaging:  CTSS IMPRESSION: 1. Moderate bilateral hydroureteronephrosis without obstructing source identified. Ureters can be followed to the level of the urinary bladder which is decompressed by Foley catheter and somewhat thick-walled. Ureteral stenosis or potentially a mucosal abnormality of the ureters or bladder causing bilateral hydroureteronephrosis are some possible considerations. 2. Small bilateral pleural effusions with atelectasis, left greater than right. 3. Increased stool retention throughout the colon consistent constipation probable fecal impaction in the rectum. Mild mural thickening of the rectum raises concern for stercoral proctitis. 4. Stable infrarenal abdominal aortic aneurysm measuring approximately 3 cm transverse. 5. Lumbar spondylosis with spondylolysis of L5 and grade 1 bordering on grade 2 spondylolisthesis.   Electronically Signed   By: Ashley Royalty M.D.   On: 10/13/2017  Impression/Recommendation 1.  Traumatic hypospadias/peno-scrotal cellulitis- given the patient's medical comorbidities and possible UTI, will defer supra-pubic catheter at this time.  I exchanged his urethral catheter and sent a urine specimen for culture, which may not be fruitful since he has been on antibiotics.  I cleaned the penis with warm/soapy water and cleared all purulent fluid and fibrinous debris.  He needs daily genital hygiene.  I instructed the nursing staff to clean the glans once per shift and to make sure that his catheter is not on any undue tension.  Currently on vancomycin, aztreonam and nystatin. I agree with converting to an oral agent such as bactrim in addition to the nystatin. Keep Foley in place. F/u as outpatient for possible voiding trial vs catheter exchange   2.  CKD III, Bilateral hydronephrosis- f/u RUS pending.  Creatinine at baseline.   3.   Possible UTI- urine culture and sensitivity pending.  Currently on broad spectrum abx.   4.  Bladder spasms with incontinence- Oxybutynin ordered.  Spasms were likely secondary to acute cystitis and/or catheter irritation   Ellison Hughs, MD [facility] 11/07/2017, 8:19 PM

## 2017-11-07 NOTE — Progress Notes (Signed)
Made Dr. Lovena Neighbours aware of urology consult. He states it will probably be later today when he is able to see him. Pt is kept NPO.   Chenee Munns Rica Mote, RN

## 2017-11-07 NOTE — Progress Notes (Signed)
Patient seen and examined, database reviewed.  No family members at bedside.  Patient was admitted early this morning from SNF due to cellulitis of penis and scrotum.  He had Foley catheter placed at the end of July.  Per staff at SNF has had leaking around Foley catheter.  Urology consultation has been requested, EDP thinks patient might benefit from suprapubic catheter, will rely on urology recommendations.  Will give another 24 hours of broad-spectrum antibiotic therapy as well as nystatin powder.  Will likely start transitioning antibiotics in a.m. Patient is hemodynamically stable, will transfer to medical floor.  Domingo Mend, MD [facility] Pager: [phone]

## 2017-11-07 NOTE — ED Notes (Signed)
Bedbath and linen change completed with NT. Pt tolerated well and tried to assist..  BM noted.

## 2017-11-07 NOTE — Progress Notes (Signed)
Pharmacy Antibiotic Note  Anthony [name] is a 77 y.o. male admitted on 11/06/2017 with cellulitis.  Pharmacy has been consulted for vancomycin and aztreonam  dosing.  Plan: Start aztreonam 1g IV q12h Loading dose: vancomycin 1g x1 dose Maintenance dose:  vancomycin 500mg  q24h Goal trough: 10-15   mcg/mL Pharmacy will continue to monitor renal function, vancomycin troughs as clinically indicated, cultures and patient progress.   Height: 6\' 4"  (193 cm) Weight: 156 lb (70.8 kg) IBW/kg (Calculated) : 86.8  Temp (24hrs), Avg:98 F (36.7 C), Min:98 F (36.7 C), Max:98.1 F (36.7 C)  Recent Labs  Lab 11/07/17 0010 11/07/17 0026  WBC 13.7*  --   CREATININE 2.28*  --   LATICACIDVEN  --  0.74    Estimated Creatinine Clearance: 26.3 mL/min (A) (by C-G formula based on SCr of 2.28 mg/dL (H)).     Antimicrobials this admission: 8/26 aztreonam>>   8/26 vancomycin >>    Microbiology results: 8/26 BCx2: NG x12h   MRSA PCR:   Thank you for allowing pharmacy to be a part of this patient's care.  Despina Pole, Pharm. D. Clinical Pharmacist 11/07/2017 9:14 AM

## 2017-11-08 ENCOUNTER — Inpatient Hospital Stay (HOSPITAL_COMMUNITY): Payer: Medicare Other

## 2017-11-08 ENCOUNTER — Encounter (HOSPITAL_COMMUNITY): Payer: Self-pay

## 2017-11-08 DIAGNOSIS — Q548 Other hypospadias: Secondary | ICD-10-CM

## 2017-11-08 DIAGNOSIS — N183 Chronic kidney disease, stage 3 (moderate): Secondary | ICD-10-CM

## 2017-11-08 DIAGNOSIS — N1330 Unspecified hydronephrosis: Secondary | ICD-10-CM

## 2017-11-08 DIAGNOSIS — N3289 Other specified disorders of bladder: Secondary | ICD-10-CM

## 2017-11-08 MED ORDER — SULFAMETHOXAZOLE-TRIMETHOPRIM 800-160 MG PO TABS
1.0000 | ORAL_TABLET | Freq: Two times a day (BID) | ORAL | Status: DC
  Administered 2017-11-08 – 2017-11-11 (×6): 1 via ORAL
  Filled 2017-11-08 (×6): qty 1

## 2017-11-08 MED ORDER — POVIDONE-IODINE 10 % EX SOLN
CUTANEOUS | Status: AC
  Filled 2017-11-08: qty 118

## 2017-11-08 NOTE — NC FL2 (Deleted)
[place] MEDICAID FL2 LEVEL OF CARE SCREENING TOOL     IDENTIFICATION  Patient Name: Anthony [name] Birthdate: 1941-01-24 Sex: male Admission Date (Current Location): 11/06/2017  [facility] and [place] Number:  [employer] and Address:  [place] [address], [place]      Provider Number: [id number]  Attending Physician Name and Address:  Isaac Bliss, [place]  Relative Name and Phone Number:       Current Level of Care: Hospital Recommended Level of Care: [place] Prior Approval Number:    Date Approved/Denied:   PASRR Number:    Discharge Plan: SNF    Current Diagnoses: Patient Active Problem List   Diagnosis Date Noted  . Cellulitis 11/07/2017  . Syncope and collapse 10/10/2017  . Acute lower UTI 10/10/2017  . AKI (acute kidney injury) ([place])   . Hematemesis 07/06/2017  . Normochromic normocytic anemia 06/19/2017  . CKD (chronic kidney disease) stage 3, GFR 30-59 ml/min (HCC) 06/19/2017  . Dementia 06/19/2017  . Decubitus ulcer 06/19/2017  . Open wnd of scalp   . S/p left hip fracture   . HCAP (healthcare-associated pneumonia) 10/31/2016  . CKD (chronic kidney disease) 10/31/2016  . COPD (chronic obstructive pulmonary disease) ([place]) 10/31/2016  . Hyponatremia 12/25/2014  . Hypokalemia 12/25/2014  . Normocytic anemia 12/25/2014  . Pulmonary nodules 12/25/2014  . Tobacco abuse 12/25/2014  . ARF (acute renal failure) ([place]) 12/24/2014  . Acute pyelonephritis 12/23/2014    Orientation RESPIRATION BLADDER Height & Weight     Self  O2(2L) Indwelling catheter Weight: 156 lb (70.8 kg) Height:  6\' 4"  (193 cm)  BEHAVIORAL SYMPTOMS/MOOD NEUROLOGICAL BOWEL NUTRITION STATUS      Incontinent Diet(Regular)  AMBULATORY STATUS COMMUNICATION OF NEEDS Skin     Verbally Other (Comment)(skin tear male genitalia )                       Personal Care Assistance Level of Assistance              Functional  Limitations Info  Sight, Hearing, Speech Sight Info: Adequate Hearing Info: Adequate Speech Info: Adequate    SPECIAL CARE FACTORS FREQUENCY  PT (By licensed PT)     PT Frequency: 5x/week               Contractures Contractures Info: Not present    Additional Factors Info  Code Status, Allergies, Psychotropic Code Status Info: full code  Allergies Info: Penicillins, Bee Venom, Strawberry extract  Psychotropic Info: Wellbutrin, Zoloft, Desyrel          Current Medications (11/08/2017):  This is the current hospital active medication list Current Facility-Administered Medications  Medication Dose Route Frequency Provider Last Rate Last Dose  . 0.9 %  sodium chloride infusion   Intravenous Continuous Isaac Bliss, Rayford Halsted, MD 75 mL/hr at 11/08/17 [phone]    . acetaminophen (TYLENOL) tablet 500 mg  500 mg Oral TID PRN Isaac Bliss, Rayford Halsted, MD   500 mg at 11/08/17 0858  . albuterol (PROVENTIL) (2.5 MG/3ML) 0.083% nebulizer solution 2.5 mg  2.5 mg Nebulization Q4H PRN Isaac Bliss, Rayford Halsted, MD      . aspirin EC tablet 81 mg  81 mg Oral Daily Isaac Bliss, Rayford Halsted, MD   81 mg at 11/08/17 0858  . aztreonam (AZACTAM) 1 g in sodium chloride 0.9 % 100 mL IVPB  1 g Intravenous Q12H Isaac Bliss, Rayford Halsted, MD 200 mL/hr at  11/08/17 0858 1 g at 11/08/17 0858  . buPROPion (WELLBUTRIN SR) 12 hr tablet 150 mg  150 mg Oral BID Isaac Bliss, Rayford Halsted, MD   150 mg at 11/08/17 0858  . enoxaparin (LOVENOX) injection 30 mg  30 mg Subcutaneous Q24H Isaac Bliss, Rayford Halsted, MD   30 mg at 11/08/17 0900  . fluconazole (DIFLUCAN) tablet 100 mg  100 mg Oral Daily Isaac Bliss, Rayford Halsted, MD   100 mg at 11/08/17 0859  . gabapentin (NEURONTIN) capsule 400 mg  400 mg Oral BID Isaac Bliss, Rayford Halsted, MD   400 mg at 11/08/17 0859  . ipratropium (ATROVENT) nebulizer solution 0.5 mg  0.5 mg Nebulization Q6H PRN Isaac Bliss, Rayford Halsted, MD      . MEDLINE mouth rinse  15 mL  Mouth Rinse BID Isaac Bliss, Rayford Halsted, MD   15 mL at 11/07/17 2105  . memantine (NAMENDA) tablet 5 mg  5 mg Oral BID Isaac Bliss, Rayford Halsted, MD   5 mg at 11/08/17 [phone]  . mometasone-formoterol (DULERA) 200-5 MCG/ACT inhaler 2 puff  2 puff Inhalation BID Isaac Bliss, Rayford Halsted, MD   2 puff at 11/08/17 [phone]  . nystatin (MYCOSTATIN/NYSTOP) topical powder   Topical BID Isaac Bliss, Rayford Halsted, MD      . ondansetron [facility]) tablet 4 mg  4 mg Oral Q6H PRN Isaac Bliss, Rayford Halsted, MD       Or  . ondansetron [facility]) injection 4 mg  4 mg Intravenous Q6H PRN Isaac Bliss, Rayford Halsted, MD      . oxybutynin (DITROPAN-XL) 24 hr tablet 10 mg  10 mg Oral QHS Ceasar Mons, MD   10 mg at 11/07/17 2114  . pantoprazole (PROTONIX) EC tablet 40 mg  40 mg Oral BID AC Isaac Bliss, Rayford Halsted, MD   40 mg at 11/08/17 0858  . polyethylene glycol (MIRALAX / GLYCOLAX) packet 17 g  17 g Oral Daily Isaac Bliss, Rayford Halsted, MD   17 g at 11/08/17 0900  . pramipexole (MIRAPEX) tablet 0.125 mg  0.125 mg Oral QHS Isaac Bliss, Rayford Halsted, MD   0.125 mg at 11/07/17 2100  . sertraline (ZOLOFT) tablet 50 mg  50 mg Oral Daily Isaac Bliss, Rayford Halsted, MD   50 mg at 11/08/17 0859  . tamsulosin (FLOMAX) capsule 0.4 mg  0.4 mg Oral Daily Isaac Bliss, Rayford Halsted, MD   0.4 mg at 11/08/17 0859  . traZODone (DESYREL) tablet 200 mg  200 mg Oral QHS Isaac Bliss, Rayford Halsted, MD   200 mg at 11/07/17 2100  . vancomycin (VANCOCIN) 500 mg in sodium chloride 0.9 % 100 mL IVPB  500 mg Intravenous Q24H Isaac Bliss, Rayford Halsted, MD   Stopped at 11/07/17 1913     Discharge Medications: Please see discharge summary for a list of discharge medications.  Relevant Imaging Results:  Relevant Lab Results:   Additional Information SSN [id number]  Merly Hinkson, Clydene Pugh, LCSW

## 2017-11-08 NOTE — [place] FL2 (Signed)
[place] MEDICAID FL2 LEVEL OF CARE SCREENING TOOL     IDENTIFICATION  Patient Name: Anthony [name] Birthdate: 05/11/1940 Sex: male Admission Date (Current Location): 11/06/2017  [facility] and [place] Number:  [employer] and Address:  [place] [address], [place]      Provider Number: [id number]  Attending Physician Name and Address:  Isaac Bliss, Olam Idler*  Relative Name and Phone Number:  Legal Hart Robinsons [phone], [facility] DSS     Current Level of Care: Hospital Recommended Level of Care: [place] Prior Approval Number:    Date Approved/Denied:   PASRR Number:    Discharge Plan: SNF    Current Diagnoses: Patient Active Problem List   Diagnosis Date Noted  . Cellulitis 11/07/2017  . Syncope and collapse 10/10/2017  . Acute lower UTI 10/10/2017  . AKI (acute kidney injury) ([place])   . Hematemesis 07/06/2017  . Normochromic normocytic anemia 06/19/2017  . CKD (chronic kidney disease) stage 3, GFR 30-59 ml/min (HCC) 06/19/2017  . Dementia 06/19/2017  . Decubitus ulcer 06/19/2017  . Open wnd of scalp   . S/p left hip fracture   . HCAP (healthcare-associated pneumonia) 10/31/2016  . CKD (chronic kidney disease) 10/31/2016  . COPD (chronic obstructive pulmonary disease) ([place]) 10/31/2016  . Hyponatremia 12/25/2014  . Hypokalemia 12/25/2014  . Normocytic anemia 12/25/2014  . Pulmonary nodules 12/25/2014  . Tobacco abuse 12/25/2014  . ARF (acute renal failure) ([place]) 12/24/2014  . Acute pyelonephritis 12/23/2014    Orientation RESPIRATION BLADDER Height & Weight     Self  O2(2L) Indwelling catheter Weight: 156 lb (70.8 kg) Height:  6\' 4"  (193 cm)  BEHAVIORAL SYMPTOMS/MOOD NEUROLOGICAL BOWEL NUTRITION STATUS      Incontinent Diet(Regular)  AMBULATORY STATUS COMMUNICATION OF NEEDS Skin     Verbally Other (Comment)(skin tear male genitalia )                        Personal Care Assistance Level of Assistance              Functional Limitations Info  Sight, Hearing, Speech Sight Info: Adequate Hearing Info: Adequate Speech Info: Adequate    SPECIAL CARE FACTORS FREQUENCY  PT (By licensed PT)     PT Frequency: 5x/week               Contractures Contractures Info: Not present    Additional Factors Info  Code Status, Allergies, Psychotropic Code Status Info: full code  Allergies Info: Penicillins, Bee Venom, Strawberry extract  Psychotropic Info: Wellbutrin, Zoloft, Desyrel          Current Medications (11/08/2017):  This is the current hospital active medication list Current Facility-Administered Medications  Medication Dose Route Frequency Provider Last Rate Last Dose  . 0.9 %  sodium chloride infusion   Intravenous Continuous Isaac Bliss, Rayford Halsted, MD 75 mL/hr at 11/08/17 [phone]    . acetaminophen (TYLENOL) tablet 500 mg  500 mg Oral TID PRN Isaac Bliss, Rayford Halsted, MD   500 mg at 11/08/17 0858  . albuterol (PROVENTIL) (2.5 MG/3ML) 0.083% nebulizer solution 2.5 mg  2.5 mg Nebulization Q4H PRN Isaac Bliss, Rayford Halsted, MD      . aspirin EC tablet 81 mg  81 mg Oral Daily Isaac Bliss, Rayford Halsted, MD   81 mg at 11/08/17 0858  . aztreonam (AZACTAM) 1 g in sodium chloride 0.9 % 100 mL IVPB  1 g Intravenous  Q12H Isaac Bliss, Rayford Halsted, MD 200 mL/hr at 11/08/17 0858 1 g at 11/08/17 0858  . buPROPion (WELLBUTRIN SR) 12 hr tablet 150 mg  150 mg Oral BID Isaac Bliss, Rayford Halsted, MD   150 mg at 11/08/17 0858  . enoxaparin (LOVENOX) injection 30 mg  30 mg Subcutaneous Q24H Isaac Bliss, Rayford Halsted, MD   30 mg at 11/08/17 0900  . fluconazole (DIFLUCAN) tablet 100 mg  100 mg Oral Daily Isaac Bliss, Rayford Halsted, MD   100 mg at 11/08/17 0859  . gabapentin (NEURONTIN) capsule 400 mg  400 mg Oral BID Isaac Bliss, Rayford Halsted, MD   400 mg at 11/08/17 0859  . ipratropium (ATROVENT) nebulizer solution 0.5 mg  0.5 mg  Nebulization Q6H PRN Isaac Bliss, Rayford Halsted, MD      . MEDLINE mouth rinse  15 mL Mouth Rinse BID Isaac Bliss, Rayford Halsted, MD   15 mL at 11/07/17 2105  . memantine (NAMENDA) tablet 5 mg  5 mg Oral BID Isaac Bliss, Rayford Halsted, MD   5 mg at 11/08/17 [phone]  . mometasone-formoterol (DULERA) 200-5 MCG/ACT inhaler 2 puff  2 puff Inhalation BID Isaac Bliss, Rayford Halsted, MD   2 puff at 11/08/17 [phone]  . nystatin (MYCOSTATIN/NYSTOP) topical powder   Topical BID Isaac Bliss, Rayford Halsted, MD      . ondansetron [facility]) tablet 4 mg  4 mg Oral Q6H PRN Isaac Bliss, Rayford Halsted, MD       Or  . ondansetron [facility]) injection 4 mg  4 mg Intravenous Q6H PRN Isaac Bliss, Rayford Halsted, MD      . oxybutynin (DITROPAN-XL) 24 hr tablet 10 mg  10 mg Oral QHS Ceasar Mons, MD   10 mg at 11/07/17 2114  . pantoprazole (PROTONIX) EC tablet 40 mg  40 mg Oral BID AC Isaac Bliss, Rayford Halsted, MD   40 mg at 11/08/17 0858  . polyethylene glycol (MIRALAX / GLYCOLAX) packet 17 g  17 g Oral Daily Isaac Bliss, Rayford Halsted, MD   17 g at 11/08/17 0900  . pramipexole (MIRAPEX) tablet 0.125 mg  0.125 mg Oral QHS Isaac Bliss, Rayford Halsted, MD   0.125 mg at 11/07/17 2100  . sertraline (ZOLOFT) tablet 50 mg  50 mg Oral Daily Isaac Bliss, Rayford Halsted, MD   50 mg at 11/08/17 0859  . tamsulosin (FLOMAX) capsule 0.4 mg  0.4 mg Oral Daily Isaac Bliss, Rayford Halsted, MD   0.4 mg at 11/08/17 0859  . traZODone (DESYREL) tablet 200 mg  200 mg Oral QHS Isaac Bliss, Rayford Halsted, MD   200 mg at 11/07/17 2100  . vancomycin (VANCOCIN) 500 mg in sodium chloride 0.9 % 100 mL IVPB  500 mg Intravenous Q24H Isaac Bliss, Rayford Halsted, MD   Stopped at 11/07/17 1913     Discharge Medications: Please see discharge summary for a list of discharge medications.  Relevant Imaging Results:  Relevant Lab Results:   Additional Information SSN [id number]  Jourden Gilson, Clydene Pugh, LCSW

## 2017-11-08 NOTE — Progress Notes (Signed)
PROGRESS NOTE    Anthony [name]  YVO:[id number] DOB: [birth year] DOA: 11/06/2017 PCP: Rosita Fire, MD     Brief Narrative:  77 year old man admitted from assisted living facility on 8/25 to surrounding his perineal area.  He has a history of stage III chronic kidney disease and hydronephrosis status post Foley catheter insertion 3 weeks ago.  He was brought in from his assisted living facility due to redness, excoriation and purulent drainage from the genital area.   Assessment & Plan:   Active Problems:   CKD (chronic kidney disease) stage 3, GFR 30-59 ml/min (HCC)   Dementia   Cellulitis   Traumatic hypospadias/penoscrotal cellulitis -Seen by urology on 8/26 who opined that he would be a poor surgical candidate for suprapubic catheter.  He has exchanged Foley catheter and has cleaned the penile area.  He recommends daily genital hygiene. -Plan to discontinue broad-spectrum antibiotic therapy including vancomycin, aztreonam as well as fluconazole. -Plan to continue topical nystatin and add Bactrim daily. -We will need follow-up with urology as an outpatient.  Right shoulder pain -Patient has a significant pain and decreased range of motion at the shoulder.  X-rays do not demonstrate a fracture, radiology is recommending an MRI of the shoulder area due to some abnormalities identified on x-ray. -Plan to obtain MRI today and will need to consider orthopedic surgery consultation pending results.  Stage III chronic kidney disease with history of bilateral hydronephrosis -Renal ultrasound does not show evidence for hydronephrosis and the bladder is completely decompressed.  Social issues -Per social work report, patient will be unable to return to ALF and we are pursuing higher level of [facility] at [facility].   DVT prophylaxis: Subcutaneous Lovenox Code Status: Full code Family Communication: Patient only Disposition Plan: SNF once medically ready in bed available  Consultants:    Urology  Procedures:   None  Antimicrobials:  Anti-infectives (From admission, onward)   Start     Dose/Rate Route Frequency Ordered Stop   11/07/17 1800  vancomycin (VANCOCIN) 500 mg in sodium chloride 0.9 % 100 mL IVPB  Status:  Discontinued     500 mg 100 mL/hr over 60 Minutes Intravenous Every 24 hours 11/07/17 0811 11/08/17 0946   11/07/17 1000  fluconazole (DIFLUCAN) tablet 100 mg  Status:  Discontinued     100 mg Oral Daily 11/07/17 0508 11/08/17 0947   11/07/17 0900  aztreonam (AZACTAM) 1 g in sodium chloride 0.9 % 100 mL IVPB  Status:  Discontinued     1 g 200 mL/hr over 30 Minutes Intravenous Every 12 hours 11/07/17 0811 11/08/17 0946   11/07/17 0000  vancomycin (VANCOCIN) IVPB 1000 mg/200 mL premix     1,000 mg 200 mL/hr over 60 Minutes Intravenous  Once 11/06/17 2358 11/07/17 0149   11/07/17 0000  aztreonam (AZACTAM) 2 g in sodium chloride 0.9 % 100 mL IVPB     2 g 200 mL/hr over 30 Minutes Intravenous  Once 11/06/17 2358 11/07/17 0149       Subjective: Lying in bed, complains of significant right arm pain with decreased range of motion.  Objective: Vitals:   11/07/17 2206 11/08/17 0635 11/08/17 0812 11/08/17 1536  BP: 109/69 109/63  113/60  Pulse: 82 77  77  Resp: 15 17    Temp: 97.6 F (36.4 C) 98.5 F (36.9 C)  97.6 F (36.4 C)  TempSrc: Oral Oral  Oral  SpO2: 98% 98% 93% 97%  Weight:      Height:  Intake/Output Summary (Last 24 hours) at 11/08/2017 1601 Last data filed at 11/08/2017 1300 Gross per 24 hour  Intake 1651.25 ml  Output 1650 ml  Net 1.25 ml   Filed Weights   11/06/17 2314  Weight: 70.8 kg    Examination:  General exam: Alert, awake, oriented x 3 Respiratory system: Clear to auscultation. Respiratory effort normal. Cardiovascular system:RRR. No murmurs, rubs, gallops. Gastrointestinal system: Abdomen is nondistended, soft and nontender. No organomegaly or masses felt. Normal bowel sounds heard. Central nervous system:  Alert and oriented. No focal neurological deficits. Extremities: No C/C/E, +pedal pulses Skin: No rashes, lesions or ulcers Psychiatry: Judgement and insight appear normal. Mood & affect appropriate.     Data Reviewed: I have personally reviewed following labs and imaging studies  CBC: Recent Labs  Lab 11/07/17 0010  WBC 13.7*  NEUTROABS 11.1*  HGB 10.2*  HCT 32.4*  MCV 95.0  PLT 010   Basic Metabolic Panel: Recent Labs  Lab 11/07/17 0010  NA 137  K 4.2  CL 106  CO2 23  GLUCOSE 115*  BUN 41*  CREATININE 2.28*  CALCIUM 8.5*   GFR: Estimated Creatinine Clearance: 26.3 mL/min (A) (by C-G formula based on SCr of 2.28 mg/dL (H)). Liver Function Tests: Recent Labs  Lab 11/07/17 0010  AST 15  ALT 12  ALKPHOS 75  BILITOT 0.4  PROT 8.0  ALBUMIN 3.0*   No results for input(s): LIPASE, AMYLASE in the last 168 hours. No results for input(s): AMMONIA in the last 168 hours. Coagulation Profile: No results for input(s): INR, PROTIME in the last 168 hours. Cardiac Enzymes: No results for input(s): CKTOTAL, CKMB, CKMBINDEX, TROPONINI in the last 168 hours. BNP (last 3 results) No results for input(s): PROBNP in the last 8760 hours. HbA1C: No results for input(s): HGBA1C in the last 72 hours. CBG: No results for input(s): GLUCAP in the last 168 hours. Lipid Profile: No results for input(s): CHOL, HDL, LDLCALC, TRIG, CHOLHDL, LDLDIRECT in the last 72 hours. Thyroid Function Tests: No results for input(s): TSH, T4TOTAL, FREET4, T3FREE, THYROIDAB in the last 72 hours. Anemia Panel: No results for input(s): VITAMINB12, FOLATE, FERRITIN, TIBC, IRON, RETICCTPCT in the last 72 hours. Urine analysis:    Component Value Date/Time   COLORURINE YELLOW 11/07/2017 0225   APPEARANCEUR CLOUDY (A) 11/07/2017 0225   LABSPEC 1.016 11/07/2017 0225   PHURINE 5.0 11/07/2017 0225   GLUCOSEU NEGATIVE 11/07/2017 0225   HGBUR NEGATIVE 11/07/2017 0225   BILIRUBINUR NEGATIVE 11/07/2017  0225   KETONESUR NEGATIVE 11/07/2017 0225   PROTEINUR NEGATIVE 11/07/2017 0225   UROBILINOGEN 1.0 12/23/2014 1853   NITRITE NEGATIVE 11/07/2017 0225   LEUKOCYTESUR NEGATIVE 11/07/2017 0225   Sepsis Labs: @LABRCNTIP (procalcitonin:4,lacticidven:4)  ) Recent Results (from the past 240 hour(s))  Blood Culture (routine x 2)     Status: None (Preliminary result)   Collection Time: 11/07/17 12:10 AM  Result Value Ref Range Status   Specimen Description BLOOD RIGHT ARM  Final   Special Requests   Final    BOTTLES DRAWN AEROBIC AND ANAEROBIC Blood Culture adequate volume   Culture   Final    NO GROWTH 1 DAY Performed at [facility], [address] [place], [place] [address]    Report Status PENDING  Incomplete  Blood Culture (routine x 2)     Status: None (Preliminary result)   Collection Time: 11/07/17 12:25 AM  Result Value Ref Range Status   Specimen Description LEFT ANTECUBITAL  Final   Special Requests  Final    BOTTLES DRAWN AEROBIC AND ANAEROBIC Blood Culture results may not be optimal due to an excessive volume of blood received in culture bottles   Culture   Final    NO GROWTH 1 DAY Performed at [facility], [address] [place], [place] [address]    Report Status PENDING  Incomplete  Wound or Superficial Culture     Status: None (Preliminary result)   Collection Time: 11/07/17  2:25 AM  Result Value Ref Range Status   Specimen Description   Final    PENIS Performed at [facility], [address], [place], [place] [address]    Special Requests PROCESS AS A WOUND CULTURE PER MD  Final   Gram Stain   Final    RARE WBC PRESENT, PREDOMINANTLY PMN ABUNDANT GRAM POSITIVE COCCI FEW GRAM NEGATIVE RODS FEW GRAM POSITIVE RODS    Culture   Final    CULTURE REINCUBATED FOR BETTER GROWTH Performed at [facility], [place] [address] [place], [place] [address]    Report Status PENDING  Incomplete  MRSA PCR Screening     Status: None   Collection Time: 11/07/17  5:08 AM   Result Value Ref Range Status   MRSA by PCR NEGATIVE NEGATIVE Final    Comment:        The GeneXpert MRSA Assay (FDA approved for NASAL specimens only), is one component of a comprehensive MRSA colonization surveillance program. It is not intended to diagnose MRSA infection nor to guide or monitor treatment for MRSA infections. Performed at [facility], [address] [place], [place] [address]          Radiology Studies: Dg Shoulder Right  Result Date: 11/07/2017 CLINICAL DATA:  Right shoulder pain EXAM: RIGHT SHOULDER - 2+ VIEW COMPARISON:  10/29/2017, 12/24/2016 FINDINGS: Negative for fracture.  Degenerative change in the [facility] joint. Mixed sclerotic and lytic lesion in the proximal humerus is unchanged from prior studies and compatible with chondroid lesion. No pathologic fracture. IMPRESSION: Mixed sclerotic and lytic lesion proximal right humerus unchanged from radiographs. This is compatible with a chondroid lesion. Given the severe pain, MRI without and with contrast is suggested to evaluate for aggressive features. Electronically Signed   By: Franchot Gallo M.D.   On: 11/07/2017 16:07   US Renal  Result Date: 11/08/2017 CLINICAL DATA:  Acute renal insufficiency, chronic kidney disease stage 3, urinary tract infection EXAM: RENAL / URINARY TRACT ULTRASOUND COMPLETE COMPARISON:  CT abdomen pelvis of 10/13/2017 FINDINGS: Right Kidney: Length: 9.0 cm. There is mild fullness the echogenicity of the renal parenchyma is within normal limits. Of the right pelvocaliceal system. Left Kidney: Length: 9.6 cm. No hydronephrosis is seen. A trace amount of fluid is noted at the tip of the left kidney. Bladder: The urinary bladder is completely decompressed and cannot be evaluated. IMPRESSION: 1. Mild fullness of the right pelvocaliceal system. 2. The renal parenchyma appears unremarkable bilaterally. 3. The urinary bladder is completely decompressed. Electronically Signed   By: Ivar Drape M.D.    On: 11/08/2017 13:06   Dg Chest Port 1 View  Result Date: 11/07/2017 CLINICAL DATA:  Sepsis EXAM: PORTABLE CHEST 1 VIEW COMPARISON:  10/29/2017 FINDINGS: Chronic changes/scarring in the lungs bilaterally. Heart is normal size. No acute confluent airspace opacities, effusions or edema. No acute bony abnormality. IMPRESSION: Chronic changes.  No active disease. Electronically Signed   By: Rolm Baptise M.D.   On: 11/07/2017 00:34  Scheduled Meds: . aspirin EC  81 mg Oral Daily  . buPROPion  150 mg Oral BID  . enoxaparin (LOVENOX) injection  30 mg Subcutaneous Q24H  . gabapentin  400 mg Oral BID  . mouth rinse  15 mL Mouth Rinse BID  . memantine  5 mg Oral BID  . mometasone-formoterol  2 puff Inhalation BID  . nystatin   Topical BID  . oxybutynin  10 mg Oral QHS  . pantoprazole  40 mg Oral BID AC  . polyethylene glycol  17 g Oral Daily  . pramipexole  0.125 mg Oral QHS  . sertraline  50 mg Oral Daily  . tamsulosin  0.4 mg Oral Daily  . traZODone  200 mg Oral QHS   Continuous Infusions: . sodium chloride 75 mL/hr at 11/08/17 0338     LOS: 1 day    Time spent: 25 minutes.     Lelon Frohlich, MD [facility] Pager [phone]  If 7PM-7AM, please contact night-coverage [email or web address] Password [email or web address] 11/08/2017, 4:01 PM

## 2017-11-08 NOTE — Progress Notes (Signed)
Subjective: Patient reports he's feeling OK  Objective: Vital signs in last 24 hours: Temp:  [97.6 F (36.4 C)-98.5 F (36.9 C)] 97.6 F (36.4 C) (08/27 1536) Pulse Rate:  [73-82] 77 (08/27 1536) Resp:  [15-17] 17 (08/27 0635) BP: (109-113)/(52-69) 113/60 (08/27 1536) SpO2:  [93 %-100 %] 97 % (08/27 1536)  Intake/Output from previous day: 08/26 0701 - 08/27 0700 In: 2022.5 [I.V.:1642.5; IV Piggyback:380] Out: 1150 [Urine:1150] Intake/Output this shift: Total I/O In: 100 [IV Piggyback:100] Out: 500 [Urine:500]  Physical Exam:  Constitutional: Vital signs reviewed. WD WN in NAD   Eyes: PERRL, No scleral icterus.   Cardiovascular: RRR Pulmonary/Chest: Normal effort Genitourinary: Penis with some residual erythema. No crepitus. Minimal tenderness. Extremities: No cyanosis or edema   Lab Results: Recent Labs    11/07/17 0010  HGB 10.2*  HCT 32.4*   BMET Recent Labs    11/07/17 0010  NA 137  K 4.2  CL 106  CO2 23  GLUCOSE 115*  BUN 41*  CREATININE 2.28*  CALCIUM 8.5*   No results for input(s): LABPT, INR in the last 72 hours. No results for input(s): LABURIN in the last 72 hours. Results for orders placed or performed during the hospital encounter of 11/06/17  Blood Culture (routine x 2)     Status: None (Preliminary result)   Collection Time: 11/07/17 12:10 AM  Result Value Ref Range Status   Specimen Description BLOOD RIGHT ARM  Final   Special Requests   Final    BOTTLES DRAWN AEROBIC AND ANAEROBIC Blood Culture adequate volume   Culture   Final    NO GROWTH 1 DAY Performed at [facility], [address], [place], [place] [address]    Report Status PENDING  Incomplete  Blood Culture (routine x 2)     Status: None (Preliminary result)   Collection Time: 11/07/17 12:25 AM  Result Value Ref Range Status   Specimen Description LEFT ANTECUBITAL  Final   Special Requests   Final    BOTTLES DRAWN AEROBIC AND ANAEROBIC Blood Culture results may not be  optimal due to an excessive volume of blood received in culture bottles   Culture   Final    NO GROWTH 1 DAY Performed at [facility], [address], [place], [place] [address]    Report Status PENDING  Incomplete  Wound or Superficial Culture     Status: None (Preliminary result)   Collection Time: 11/07/17  2:25 AM  Result Value Ref Range Status   Specimen Description   Final    PENIS Performed at [facility], [address], [place], [place] [address]    Special Requests PROCESS AS A WOUND CULTURE PER MD  Final   Gram Stain   Final    RARE WBC PRESENT, PREDOMINANTLY PMN ABUNDANT GRAM POSITIVE COCCI FEW GRAM NEGATIVE RODS FEW GRAM POSITIVE RODS    Culture   Final    CULTURE REINCUBATED FOR BETTER GROWTH Performed at [facility], [place] [address], [place], [place] [address]    Report Status PENDING  Incomplete  MRSA PCR Screening     Status: None   Collection Time: 11/07/17  5:08 AM  Result Value Ref Range Status   MRSA by PCR NEGATIVE NEGATIVE Final    Comment:        The GeneXpert MRSA Assay (FDA approved for NASAL specimens only), is one component of a comprehensive MRSA colonization surveillance program. It is not intended to diagnose MRSA infection nor to  guide or monitor treatment for MRSA infections. Performed at [facility], [address], [place],  [address]     Studies/Results: Dg Shoulder Right  Result Date: 11/07/2017 CLINICAL DATA:  Right shoulder pain EXAM: RIGHT SHOULDER - 2+ VIEW COMPARISON:  10/29/2017, 12/24/2016 FINDINGS: Negative for fracture.  Degenerative change in the [facility] joint. Mixed sclerotic and lytic lesion in the proximal humerus is unchanged from prior studies and compatible with chondroid lesion. No pathologic fracture. IMPRESSION: Mixed sclerotic and lytic lesion proximal right humerus unchanged from radiographs. This is compatible with a chondroid lesion. Given the severe pain, MRI without and with contrast is suggested to  evaluate for aggressive features. Electronically Signed   By: Franchot Gallo M.D.   On: 11/07/2017 16:07   US Renal  Result Date: 11/08/2017 CLINICAL DATA:  Acute renal insufficiency, chronic kidney disease stage 3, urinary tract infection EXAM: RENAL / URINARY TRACT ULTRASOUND COMPLETE COMPARISON:  CT abdomen pelvis of 10/13/2017 FINDINGS: Right Kidney: Length: 9.0 cm. There is mild fullness the echogenicity of the renal parenchyma is within normal limits. Of the right pelvocaliceal system. Left Kidney: Length: 9.6 cm. No hydronephrosis is seen. A trace amount of fluid is noted at the tip of the left kidney. Bladder: The urinary bladder is completely decompressed and cannot be evaluated. IMPRESSION: 1. Mild fullness of the right pelvocaliceal system. 2. The renal parenchyma appears unremarkable bilaterally. 3. The urinary bladder is completely decompressed. Electronically Signed   By: Ivar Drape M.D.   On: 11/08/2017 13:06   Dg Chest Port 1 View  Result Date: 11/07/2017 CLINICAL DATA:  Sepsis EXAM: PORTABLE CHEST 1 VIEW COMPARISON:  10/29/2017 FINDINGS: Chronic changes/scarring in the lungs bilaterally. Heart is normal size. No acute confluent airspace opacities, effusions or edema. No acute bony abnormality. IMPRESSION: Chronic changes.  No active disease. Electronically Signed   By: Rolm Baptise M.D.   On: 11/07/2017 00:34    Assessment/Plan:   Traumatic hypospadius with cellulitis. Doubt it will get worse. At this point continue local care. Would not rec sp tube at this point, as his penis is short and I don't think it will progress.   Will continue to follow.   LOS: 1 day   Jorja Loa 11/08/2017, 5:18 PM

## 2017-11-09 ENCOUNTER — Ambulatory Visit: Payer: Medicare Other | Admitting: Urology

## 2017-11-09 LAB — BASIC METABOLIC PANEL
Anion gap: 6 (ref 5–15)
BUN: 34 mg/dL — AB (ref 8–23)
CALCIUM: 7.9 mg/dL — AB (ref 8.9–10.3)
CO2: 21 mmol/L — ABNORMAL LOW (ref 22–32)
CREATININE: 1.72 mg/dL — AB (ref 0.61–1.24)
Chloride: 111 mmol/L (ref 98–111)
GFR, EST AFRICAN AMERICAN: 42 mL/min — AB (ref >60)
GFR, EST NON AFRICAN AMERICAN: 36 mL/min — AB (ref >60)
Glucose, Bld: 93 mg/dL (ref 70–99)
Potassium: 4.4 mmol/L (ref 3.5–5.1)
SODIUM: 138 mmol/L (ref 135–145)

## 2017-11-09 LAB — CBC
HCT: 27 % — ABNORMAL LOW (ref 39.0–52.0)
Hemoglobin: 8.3 g/dL — ABNORMAL LOW (ref 13.0–17.0)
MCH: 29.2 pg (ref 26.0–34.0)
MCHC: 30.7 g/dL (ref 30.0–36.0)
MCV: 95.1 fL (ref 78.0–100.0)
PLATELETS: 242 10*3/uL (ref 150–400)
RBC: 2.84 MIL/uL — AB (ref 4.22–5.81)
RDW: 15.4 % (ref 11.5–15.5)
WBC: 6 10*3/uL (ref 4.0–10.5)

## 2017-11-09 MED ORDER — OXYCODONE HCL 5 MG PO TABS
5.0000 mg | ORAL_TABLET | ORAL | Status: DC | PRN
  Administered 2017-11-09 – 2017-11-10 (×3): 5 mg via ORAL
  Filled 2017-11-09 (×3): qty 1

## 2017-11-09 MED ORDER — ENOXAPARIN SODIUM 40 MG/0.4ML ~~LOC~~ SOLN
40.0000 mg | SUBCUTANEOUS | Status: DC
  Administered 2017-11-10 – 2017-11-11 (×2): 40 mg via SUBCUTANEOUS
  Filled 2017-11-09 (×2): qty 0.4

## 2017-11-09 NOTE — Progress Notes (Signed)
PROGRESS NOTE    Anthony [name]  WUJ:[id number] DOB: 05/29/1940 DOA: 11/06/2017 PCP: Rosita Fire, MD   Brief Narrative:   77 year old man admitted from assisted living facility on 8/25 to surrounding his perineal area.  He has a history of stage III chronic kidney disease and hydronephrosis status post Foley catheter insertion 3 weeks ago.  He was brought in from his assisted living facility due to redness, excoriation and purulent drainage from the genital area.  He continues to have pain to his right shoulder for which she has had an MRI demonstrating partial-thickness rotator cuff tears as well as tendinopathy.  Assessment & Plan:   Active Problems:   CKD (chronic kidney disease) stage 3, GFR 30-59 ml/min (HCC)   Dementia   Cellulitis   1. Traumatic hypospadias with penoscrotal cellulitis-improving.  Continue ongoing treatment with topical nystatin and Bactrim per urology recommendations for another 7 days.  Appreciate urology recommendations. 2. Right shoulder pain secondary to rotator cuff partial tear and tendinopathy.  PT evaluation and placement in sling.  Addition of oxycodone as needed for breakthrough pain control.  Will need outpatient orthopedic referral. 3. Stage III CKD with history of bilateral hydronephrosis.  Renal ultrasound with no evidence of hydronephrosis and bladder is decompressed. 4. Chronic anemia.  Stable.  Repeat CBC in a.m. 5. Social issues.  Pursuing higher level of care to SNF.   DVT prophylaxis:Lovenox Code Status: Full Family Communication: Patient only Disposition Plan: To SNF once bed is available   Consultants:   Urology  Procedures:   None  Antimicrobials:   Bactrim and Nystatin 8/27->  Vancomycin,aztreonam 8/25-8/27   Subjective: Patient seen and evaluated today with no new acute complaints or concerns. No acute concerns or events noted overnight. He continues to have pain to his R shoulder.  Objective: Vitals:   11/08/17 1930  11/08/17 2141 11/09/17 0649 11/09/17 0736  BP:  120/60 (!) 112/53   Pulse:  82 76   Resp:  20 20   Temp:  98 F (36.7 C) 98.2 F (36.8 C)   TempSrc:  Oral Oral   SpO2: 96% 96% 93% 97%  Weight:      Height:        Intake/Output Summary (Last 24 hours) at 11/09/2017 1410 Last data filed at 11/09/2017 0700 Gross per 24 hour  Intake 720 ml  Output 1150 ml  Net -430 ml   Filed Weights   11/06/17 2314  Weight: 70.8 kg    Examination:  General exam: Appears calm and comfortable  Respiratory system: Clear to auscultation. Respiratory effort normal. Cardiovascular system: S1 & S2 heard, RRR. No JVD, murmurs, rubs, gallops or clicks. No pedal edema. Gastrointestinal system: Abdomen is nondistended, soft and nontender. No organomegaly or masses felt. Normal bowel sounds heard. Central nervous system: Alert and oriented. No focal neurological deficits. Extremities: Symmetric 5 x 5 power. Skin: No rashes, lesions or ulcers Psychiatry: Judgement and insight appear normal. Mood & affect appropriate.     Data Reviewed: I have personally reviewed following labs and imaging studies  CBC: Recent Labs  Lab 11/07/17 0010 11/09/17 0419  WBC 13.7* 6.0  NEUTROABS 11.1*  --   HGB 10.2* 8.3*  HCT 32.4* 27.0*  MCV 95.0 95.1  PLT 292 956   Basic Metabolic Panel: Recent Labs  Lab 11/07/17 0010 11/09/17 0419  NA 137 138  K 4.2 4.4  CL 106 111  CO2 23 21*  GLUCOSE 115* 93  BUN 41* 34*  CREATININE 2.28* 1.72*  CALCIUM 8.5* 7.9*   GFR: Estimated Creatinine Clearance: 34.9 mL/min (A) (by C-G formula based on SCr of 1.72 mg/dL (H)). Liver Function Tests: Recent Labs  Lab 11/07/17 0010  AST 15  ALT 12  ALKPHOS 75  BILITOT 0.4  PROT 8.0  ALBUMIN 3.0*   No results for input(s): LIPASE, AMYLASE in the last 168 hours. No results for input(s): AMMONIA in the last 168 hours. Coagulation Profile: No results for input(s): INR, PROTIME in the last 168 hours. Cardiac Enzymes: No  results for input(s): CKTOTAL, CKMB, CKMBINDEX, TROPONINI in the last 168 hours. BNP (last 3 results) No results for input(s): PROBNP in the last 8760 hours. HbA1C: No results for input(s): HGBA1C in the last 72 hours. CBG: No results for input(s): GLUCAP in the last 168 hours. Lipid Profile: No results for input(s): CHOL, HDL, LDLCALC, TRIG, CHOLHDL, LDLDIRECT in the last 72 hours. Thyroid Function Tests: No results for input(s): TSH, T4TOTAL, FREET4, T3FREE, THYROIDAB in the last 72 hours. Anemia Panel: No results for input(s): VITAMINB12, FOLATE, FERRITIN, TIBC, IRON, RETICCTPCT in the last 72 hours. Sepsis Labs: Recent Labs  Lab 11/07/17 0026  LATICACIDVEN 0.74    Recent Results (from the past 240 hour(s))  Blood Culture (routine x 2)     Status: None (Preliminary result)   Collection Time: 11/07/17 12:10 AM  Result Value Ref Range Status   Specimen Description BLOOD RIGHT ARM  Final   Special Requests   Final    BOTTLES DRAWN AEROBIC AND ANAEROBIC Blood Culture adequate volume   Culture   Final    NO GROWTH 2 DAYS Performed at [facility], [address] [place], [place] [address]    Report Status PENDING  Incomplete  Blood Culture (routine x 2)     Status: None (Preliminary result)   Collection Time: 11/07/17 12:25 AM  Result Value Ref Range Status   Specimen Description LEFT ANTECUBITAL  Final   Special Requests   Final    BOTTLES DRAWN AEROBIC AND ANAEROBIC Blood Culture results may not be optimal due to an excessive volume of blood received in culture bottles   Culture   Final    NO GROWTH 2 DAYS Performed at [facility], [address] [place], [place] [address]    Report Status PENDING  Incomplete  Wound or Superficial Culture     Status: None (Preliminary result)   Collection Time: 11/07/17  2:25 AM  Result Value Ref Range Status   Specimen Description   Final    PENIS Performed at [facility], [address], [place], [place] [address]    Special  Requests PROCESS AS A WOUND CULTURE PER MD  Final   Gram Stain   Final    RARE WBC PRESENT, PREDOMINANTLY PMN ABUNDANT GRAM POSITIVE COCCI FEW GRAM NEGATIVE RODS FEW GRAM POSITIVE RODS    Culture   Final    FEW NORMAL SKIN FLORA Performed at [facility], [place] [address], [place], [place] [address]    Report Status PENDING  Incomplete  MRSA PCR Screening     Status: None   Collection Time: 11/07/17  5:08 AM  Result Value Ref Range Status   MRSA by PCR NEGATIVE NEGATIVE Final    Comment:        The GeneXpert MRSA Assay (FDA approved for NASAL specimens only), is one component of a comprehensive MRSA colonization surveillance program. It is not intended to diagnose MRSA infection nor to guide or monitor treatment for  MRSA infections. Performed at [facility], [address], [place], [place] [address]          Radiology Studies: Dg Shoulder Right  Result Date: 11/07/2017 CLINICAL DATA:  Right shoulder pain EXAM: RIGHT SHOULDER - 2+ VIEW COMPARISON:  10/29/2017, 12/24/2016 FINDINGS: Negative for fracture.  Degenerative change in the [facility] joint. Mixed sclerotic and lytic lesion in the proximal humerus is unchanged from prior studies and compatible with chondroid lesion. No pathologic fracture. IMPRESSION: Mixed sclerotic and lytic lesion proximal right humerus unchanged from radiographs. This is compatible with a chondroid lesion. Given the severe pain, MRI without and with contrast is suggested to evaluate for aggressive features. Electronically Signed   By: Franchot Gallo M.D.   On: 11/07/2017 16:07   US Renal  Result Date: 11/08/2017 CLINICAL DATA:  Acute renal insufficiency, chronic kidney disease stage 3, urinary tract infection EXAM: RENAL / URINARY TRACT ULTRASOUND COMPLETE COMPARISON:  CT abdomen pelvis of 10/13/2017 FINDINGS: Right Kidney: Length: 9.0 cm. There is mild fullness the echogenicity of the renal parenchyma is within normal limits. Of the right  pelvocaliceal system. Left Kidney: Length: 9.6 cm. No hydronephrosis is seen. A trace amount of fluid is noted at the tip of the left kidney. Bladder: The urinary bladder is completely decompressed and cannot be evaluated. IMPRESSION: 1. Mild fullness of the right pelvocaliceal system. 2. The renal parenchyma appears unremarkable bilaterally. 3. The urinary bladder is completely decompressed. Electronically Signed   By: Ivar Drape M.D.   On: 11/08/2017 13:06   Mr Shoulder Right Wo Contrast  Result Date: 11/08/2017 CLINICAL DATA:  Right shoulder pain and decreased range of motion. History of proximal humerus chondroid lesion. EXAM: MRI OF THE RIGHT SHOULDER WITHOUT CONTRAST TECHNIQUE: Multiplanar, multisequence MR imaging of the shoulder was performed. No intravenous contrast was administered. COMPARISON:  MRI right humerus dated March 04, 2017. FINDINGS: Rotator cuff: High-grade partial-thickness articular surface tear of the supraspinatus tendon. There may be a small full-thickness perforation of the distal tendon at the footprint. The infraspinatus, subscapularis, and teres minor tendons are intact. Muscles: Mild diffuse rotator cuff muscle atrophy. No muscle edema. Biceps long head:  Intact and normally positioned. Acromioclavicular Joint: Mild arthropathy of the acromioclavicular joint. Type I acromion. Small subacromial/subdeltoid bursal fluid. Glenohumeral Joint: Diffuse cartilage thinning without focal defect. Small joint effusion with synovitis. Labrum:  Degenerative tearing of the superior labrum. Bones: Grossly unchanged heterogeneous lesion in the proximal right humeral diaphysis with areas of T2 hyperintensity as well as T1 and T2 hypointensity. No surrounding marrow edema. No cortical disruption. No acute fracture or dislocation. Other: None. IMPRESSION: 1. High-grade partial-thickness articular surface tear of the supraspinatus tendon with probable small full-thickness perforation of the distal  tendon at the footprint. 2. Mild diffuse rotator cuff muscle atrophy. 3. Degenerative tearing of the superior labrum. 4. 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 5. Mild acromioclavicular and glenohumeral osteoarthritis. Electronically Signed   By: Titus Dubin M.D.   On: 11/08/2017 19:12        Scheduled Meds: . aspirin EC  81 mg Oral Daily  . buPROPion  150 mg Oral BID  . [START ON 11/10/2017] enoxaparin (LOVENOX) injection  40 mg Subcutaneous Q24H  . gabapentin  400 mg Oral BID  . mouth rinse  15 mL Mouth Rinse BID  . memantine  5 mg Oral BID  . mometasone-formoterol  2 puff Inhalation BID  . nystatin   Topical BID  .  oxybutynin  10 mg Oral QHS  . pantoprazole  40 mg Oral BID AC  . polyethylene glycol  17 g Oral Daily  . pramipexole  0.125 mg Oral QHS  . sertraline  50 mg Oral Daily  . sulfamethoxazole-trimethoprim  1 tablet Oral Q12H  . tamsulosin  0.4 mg Oral Daily  . traZODone  200 mg Oral QHS   Continuous Infusions: . sodium chloride 75 mL/hr at 11/08/17 2133     LOS: 2 days    Time spent: 30 minutes    Symone Cornman Darleen Crocker, DO [facility] Pager [phone]  If 7PM-7AM, please contact night-coverage [email or web address] Password [email or web address] 11/09/2017, 2:10 PM

## 2017-11-09 NOTE — Care Management Important Message (Signed)
Important Message  Patient Details  Name: Anthony [name] MRN: [id number] Date of Birth: 1940/10/01   Medicare Important Message Given:  Yes Pt unable to sign due to health condition.   Holli Humbles Badertscher 11/09/2017, 1:44 PM

## 2017-11-09 NOTE — Progress Notes (Signed)
Applied sling to right arm

## 2017-11-09 NOTE — Progress Notes (Signed)
Subjective: No new complaints. Patient has mild penile pain. No fevers overnight  Objective: Vital signs in last 24 hours: Temp:  [97.6 F (36.4 C)-98.2 F (36.8 C)] 98.2 F (36.8 C) (08/28 0649) Pulse Rate:  [76-82] 76 (08/28 0649) Resp:  [20] 20 (08/28 0649) BP: (112-120)/(53-60) 112/53 (08/28 0649) SpO2:  [93 %-97 %] 97 % (08/28 0736)  Intake/Output from previous day: 08/27 0701 - 08/28 0700 In: 820 [P.O.:720; IV Piggyback:100] Out: 1650 [Urine:1650] Intake/Output this shift: No intake/output data recorded.  Physical Exam:  Constitutional: Vital signs reviewed. WD WN in NAD   Eyes: PERRL, No scleral icterus.   Cardiovascular: RRR Pulmonary/Chest: Normal effort Genitourinary: Penis with mild erythema. No crepitus. Mild tenderness. Extremities: No cyanosis or edema   Lab Results: Recent Labs    11/07/17 0010 11/09/17 0419  HGB 10.2* 8.3*  HCT 32.4* 27.0*   BMET Recent Labs    11/07/17 0010 11/09/17 0419  NA 137 138  K 4.2 4.4  CL 106 111  CO2 23 21*  GLUCOSE 115* 93  BUN 41* 34*  CREATININE 2.28* 1.72*  CALCIUM 8.5* 7.9*   No results for input(s): LABPT, INR in the last 72 hours. No results for input(s): LABURIN in the last 72 hours. Results for orders placed or performed during the hospital encounter of 11/06/17  Blood Culture (routine x 2)     Status: None (Preliminary result)   Collection Time: 11/07/17 12:10 AM  Result Value Ref Range Status   Specimen Description BLOOD RIGHT ARM  Final   Special Requests   Final    BOTTLES DRAWN AEROBIC AND ANAEROBIC Blood Culture adequate volume   Culture   Final    NO GROWTH 2 DAYS Performed at [facility], [address], [place], [place] [address]    Report Status PENDING  Incomplete  Blood Culture (routine x 2)     Status: None (Preliminary result)   Collection Time: 11/07/17 12:25 AM  Result Value Ref Range Status   Specimen Description LEFT ANTECUBITAL  Final   Special Requests   Final    BOTTLES  DRAWN AEROBIC AND ANAEROBIC Blood Culture results may not be optimal due to an excessive volume of blood received in culture bottles   Culture   Final    NO GROWTH 2 DAYS Performed at [facility], [address], [place], [place] [address]    Report Status PENDING  Incomplete  Wound or Superficial Culture     Status: None (Preliminary result)   Collection Time: 11/07/17  2:25 AM  Result Value Ref Range Status   Specimen Description   Final    PENIS Performed at [facility], [address] [place], [place] [address]    Special Requests PROCESS AS A WOUND CULTURE PER MD  Final   Gram Stain   Final    RARE WBC PRESENT, PREDOMINANTLY PMN ABUNDANT GRAM POSITIVE COCCI FEW GRAM NEGATIVE RODS FEW GRAM POSITIVE RODS    Culture   Final    FEW NORMAL SKIN FLORA Performed at [facility], [place] [address], [place], [place] [address]    Report Status PENDING  Incomplete  MRSA PCR Screening     Status: None   Collection Time: 11/07/17  5:08 AM  Result Value Ref Range Status   MRSA by PCR NEGATIVE NEGATIVE Final    Comment:        The GeneXpert MRSA Assay (FDA approved for NASAL specimens only), is one component of a comprehensive MRSA  colonization surveillance program. It is not intended to diagnose MRSA infection nor to guide or monitor treatment for MRSA infections. Performed at [facility], [address] [place], [place] [address]     Studies/Results: Dg Shoulder Right  Result Date: 11/07/2017 CLINICAL DATA:  Right shoulder pain EXAM: RIGHT SHOULDER - 2+ VIEW COMPARISON:  10/29/2017, 12/24/2016 FINDINGS: Negative for fracture.  Degenerative change in the [facility] joint. Mixed sclerotic and lytic lesion in the proximal humerus is unchanged from prior studies and compatible with chondroid lesion. No pathologic fracture. IMPRESSION: Mixed sclerotic and lytic lesion proximal right humerus unchanged from radiographs. This is compatible with a chondroid lesion. Given the severe pain,  MRI without and with contrast is suggested to evaluate for aggressive features. Electronically Signed   By: Franchot Gallo M.D.   On: 11/07/2017 16:07   US Renal  Result Date: 11/08/2017 CLINICAL DATA:  Acute renal insufficiency, chronic kidney disease stage 3, urinary tract infection EXAM: RENAL / URINARY TRACT ULTRASOUND COMPLETE COMPARISON:  CT abdomen pelvis of 10/13/2017 FINDINGS: Right Kidney: Length: 9.0 cm. There is mild fullness the echogenicity of the renal parenchyma is within normal limits. Of the right pelvocaliceal system. Left Kidney: Length: 9.6 cm. No hydronephrosis is seen. A trace amount of fluid is noted at the tip of the left kidney. Bladder: The urinary bladder is completely decompressed and cannot be evaluated. IMPRESSION: 1. Mild fullness of the right pelvocaliceal system. 2. The renal parenchyma appears unremarkable bilaterally. 3. The urinary bladder is completely decompressed. Electronically Signed   By: Ivar Drape M.D.   On: 11/08/2017 13:06   Mr Shoulder Right Wo Contrast  Result Date: 11/08/2017 CLINICAL DATA:  Right shoulder pain and decreased range of motion. History of proximal humerus chondroid lesion. EXAM: MRI OF THE RIGHT SHOULDER WITHOUT CONTRAST TECHNIQUE: Multiplanar, multisequence MR imaging of the shoulder was performed. No intravenous contrast was administered. COMPARISON:  MRI right humerus dated March 04, 2017. FINDINGS: Rotator cuff: High-grade partial-thickness articular surface tear of the supraspinatus tendon. There may be a small full-thickness perforation of the distal tendon at the footprint. The infraspinatus, subscapularis, and teres minor tendons are intact. Muscles: Mild diffuse rotator cuff muscle atrophy. No muscle edema. Biceps long head:  Intact and normally positioned. Acromioclavicular Joint: Mild arthropathy of the acromioclavicular joint. Type I acromion. Small subacromial/subdeltoid bursal fluid. Glenohumeral Joint: Diffuse cartilage  thinning without focal defect. Small joint effusion with synovitis. Labrum:  Degenerative tearing of the superior labrum. Bones: Grossly unchanged heterogeneous lesion in the proximal right humeral diaphysis with areas of T2 hyperintensity as well as T1 and T2 hypointensity. No surrounding marrow edema. No cortical disruption. No acute fracture or dislocation. Other: None. IMPRESSION: 1. High-grade partial-thickness articular surface tear of the supraspinatus tendon with probable small full-thickness perforation of the distal tendon at the footprint. 2. Mild diffuse rotator cuff muscle atrophy. 3. Degenerative tearing of the superior labrum. 4. 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 5. Mild acromioclavicular and glenohumeral osteoarthritis. Electronically Signed   By: Titus Dubin M.D.   On: 11/08/2017 19:12    Assessment/Plan:   Traumatic hypospadius with cellulitis: Please continue antibiotics for 7 days. The erythema has almost resolved and the patient will not need any further urologic intervention   LOS: 2 days   Nicolette Bang 11/09/2017, 1:56 PM

## 2017-11-10 DIAGNOSIS — N183 Chronic kidney disease, stage 3 (moderate): Secondary | ICD-10-CM

## 2017-11-10 DIAGNOSIS — L03818 Cellulitis of other sites: Secondary | ICD-10-CM

## 2017-11-10 LAB — AEROBIC CULTURE  (SUPERFICIAL SPECIMEN): CULTURE: NORMAL

## 2017-11-10 LAB — AEROBIC CULTURE W GRAM STAIN (SUPERFICIAL SPECIMEN)

## 2017-11-10 NOTE — Progress Notes (Signed)
PROGRESS NOTE    Anthony [name]  HCW:[id number] DOB: [birth year] DOA: 11/06/2017 PCP: Rosita Fire, MD   Brief Narrative:   77 year old man admitted from assisted living facility on 8/25 to surrounding his perineal area. He has a history of stage III chronic kidney disease and hydronephrosis status post Foley catheter insertion 3 weeks ago. He was brought in from his assisted living facility due to redness, excoriation and purulent drainage from the genital area.  He continues to have pain to his right shoulder for which she has had an MRI demonstrating partial-thickness rotator cuff tears as well as tendinopathy.  Assessment & Plan:   Active Problems:   CKD (chronic kidney disease) stage 3, GFR 30-59 ml/min (HCC)   Dementia   Cellulitis   1. Traumatic hypospadias with penoscrotal cellulitis-improving.  Continue ongoing treatment with topical nystatin and Bactrim per urology recommendations for another 6 days.  Appreciate urology recommendations. 2. Right shoulder pain secondary to rotator cuff partial tear and tendinopathy.  PT evaluation and placement in sling.  Addition of oxycodone as needed for breakthrough pain control.  Will need outpatient orthopedic referral. 3. Stage III CKD with history of bilateral hydronephrosis.  Renal ultrasound with no evidence of hydronephrosis and bladder is decompressed. 4. Chronic anemia.  Stable.  Repeat CBC in a.m. 5. Social issues.  Pursuing higher level of care to SNF.   DVT prophylaxis:Lovenox Code Status: Full Family Communication: Patient only Disposition Plan: To SNF once bed is available   Consultants:   Urology  Procedures:   None  Antimicrobials:   Bactrim and Nystatin 8/27->  Vancomycin,aztreonam 8/25-8/27  Subjective: Patient seen and evaluated today with no new acute complaints or concerns. No acute concerns or events noted overnight. His shoulder continues to be painful with movement.  Objective: Vitals:     11/10/17 0551 11/10/17 0700 11/10/17 0732 11/10/17 1406  BP: (!) 122/55   (!) 110/50  Pulse: 79   78  Resp: 16   20  Temp: 98.1 F (36.7 C)   98 F (36.7 C)  TempSrc: Oral   Oral  SpO2: 99%  95% 97%  Weight:  81.8 kg    Height:        Intake/Output Summary (Last 24 hours) at 11/10/2017 1438 Last data filed at 11/10/2017 0940 Gross per 24 hour  Intake 1260 ml  Output 2900 ml  Net -1640 ml   Filed Weights   11/06/17 2314 11/10/17 0700  Weight: 70.8 kg 81.8 kg    Examination:  General exam: Appears calm and comfortable  Respiratory system: Clear to auscultation. Respiratory effort normal. Cardiovascular system: S1 & S2 heard, RRR. No JVD, murmurs, rubs, gallops or clicks. No pedal edema. Gastrointestinal system: Abdomen is nondistended, soft and nontender. No organomegaly or masses felt. Normal bowel sounds heard. Central nervous system: Alert and oriented. No focal neurological deficits. Extremities: Symmetric 5 x 5 power. Skin: No rashes, lesions or ulcers Psychiatry: Judgement and insight appear normal. Mood & affect appropriate.  Foley with clear, yellow, UO noted.    Data Reviewed: I have personally reviewed following labs and imaging studies  CBC: Recent Labs  Lab 11/07/17 0010 11/09/17 0419  WBC 13.7* 6.0  NEUTROABS 11.1*  --   HGB 10.2* 8.3*  HCT 32.4* 27.0*  MCV 95.0 95.1  PLT 292 176   Basic Metabolic Panel: Recent Labs  Lab 11/07/17 0010 11/09/17 0419  NA 137 138  K 4.2 4.4  CL 106 111  CO2 23 21*  GLUCOSE 115*  93  BUN 41* 34*  CREATININE 2.28* 1.72*  CALCIUM 8.5* 7.9*   GFR: Estimated Creatinine Clearance: 40.3 mL/min (A) (by C-G formula based on SCr of 1.72 mg/dL (H)). Liver Function Tests: Recent Labs  Lab 11/07/17 0010  AST 15  ALT 12  ALKPHOS 75  BILITOT 0.4  PROT 8.0  ALBUMIN 3.0*   No results for input(s): LIPASE, AMYLASE in the last 168 hours. No results for input(s): AMMONIA in the last 168 hours. Coagulation  Profile: No results for input(s): INR, PROTIME in the last 168 hours. Cardiac Enzymes: No results for input(s): CKTOTAL, CKMB, CKMBINDEX, TROPONINI in the last 168 hours. BNP (last 3 results) No results for input(s): PROBNP in the last 8760 hours. HbA1C: No results for input(s): HGBA1C in the last 72 hours. CBG: No results for input(s): GLUCAP in the last 168 hours. Lipid Profile: No results for input(s): CHOL, HDL, LDLCALC, TRIG, CHOLHDL, LDLDIRECT in the last 72 hours. Thyroid Function Tests: No results for input(s): TSH, T4TOTAL, FREET4, T3FREE, THYROIDAB in the last 72 hours. Anemia Panel: No results for input(s): VITAMINB12, FOLATE, FERRITIN, TIBC, IRON, RETICCTPCT in the last 72 hours. Sepsis Labs: Recent Labs  Lab 11/07/17 0026  LATICACIDVEN 0.74    Recent Results (from the past 240 hour(s))  Blood Culture (routine x 2)     Status: None (Preliminary result)   Collection Time: 11/07/17 12:10 AM  Result Value Ref Range Status   Specimen Description BLOOD RIGHT ARM  Final   Special Requests   Final    BOTTLES DRAWN AEROBIC AND ANAEROBIC Blood Culture adequate volume   Culture   Final    NO GROWTH 3 DAYS Performed at [facility], [address], [place], [place] [address]    Report Status PENDING  Incomplete  Blood Culture (routine x 2)     Status: None (Preliminary result)   Collection Time: 11/07/17 12:25 AM  Result Value Ref Range Status   Specimen Description LEFT ANTECUBITAL  Final   Special Requests   Final    BOTTLES DRAWN AEROBIC AND ANAEROBIC Blood Culture results may not be optimal due to an excessive volume of blood received in culture bottles   Culture   Final    NO GROWTH 3 DAYS Performed at [facility], [address], [place], [place] [address]    Report Status PENDING  Incomplete  Wound or Superficial Culture     Status: None   Collection Time: 11/07/17  2:25 AM  Result Value Ref Range Status   Specimen Description   Final    PENIS Performed at  [facility], [address] [place], [place] [address]    Special Requests PROCESS AS A WOUND CULTURE PER MD  Final   Gram Stain   Final    RARE WBC PRESENT, PREDOMINANTLY PMN ABUNDANT GRAM POSITIVE COCCI FEW GRAM NEGATIVE RODS FEW GRAM POSITIVE RODS    Culture   Final    FEW NORMAL SKIN FLORA Performed at [facility], [place] [address], [place], [place] [address]    Report Status 11/10/2017 FINAL  Final  MRSA PCR Screening     Status: None   Collection Time: 11/07/17  5:08 AM  Result Value Ref Range Status   MRSA by PCR NEGATIVE NEGATIVE Final    Comment:        The GeneXpert MRSA Assay (FDA approved for NASAL specimens only), is one component of a comprehensive MRSA colonization surveillance program. It is not intended to diagnose  MRSA infection nor to guide or monitor treatment for MRSA infections. Performed at [facility], [address], [place], [place] [address]          Radiology Studies: Mr Shoulder Right Wo Contrast  Result Date: 11/08/2017 CLINICAL DATA:  Right shoulder pain and decreased range of motion. History of proximal humerus chondroid lesion. EXAM: MRI OF THE RIGHT SHOULDER WITHOUT CONTRAST TECHNIQUE: Multiplanar, multisequence MR imaging of the shoulder was performed. No intravenous contrast was administered. COMPARISON:  MRI right humerus dated March 04, 2017. FINDINGS: Rotator cuff: High-grade partial-thickness articular surface tear of the supraspinatus tendon. There may be a small full-thickness perforation of the distal tendon at the footprint. The infraspinatus, subscapularis, and teres minor tendons are intact. Muscles: Mild diffuse rotator cuff muscle atrophy. No muscle edema. Biceps long head:  Intact and normally positioned. Acromioclavicular Joint: Mild arthropathy of the acromioclavicular joint. Type I acromion. Small subacromial/subdeltoid bursal fluid. Glenohumeral Joint: Diffuse cartilage thinning without focal defect. Small joint  effusion with synovitis. Labrum:  Degenerative tearing of the superior labrum. Bones: Grossly unchanged heterogeneous lesion in the proximal right humeral diaphysis with areas of T2 hyperintensity as well as T1 and T2 hypointensity. No surrounding marrow edema. No cortical disruption. No acute fracture or dislocation. Other: None. IMPRESSION: 1. High-grade partial-thickness articular surface tear of the supraspinatus tendon with probable small full-thickness perforation of the distal tendon at the footprint. 2. Mild diffuse rotator cuff muscle atrophy. 3. Degenerative tearing of the superior labrum. 4. 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 5. Mild acromioclavicular and glenohumeral osteoarthritis. Electronically Signed   By: Titus Dubin M.D.   On: 11/08/2017 19:12        Scheduled Meds: . aspirin EC  81 mg Oral Daily  . buPROPion  150 mg Oral BID  . enoxaparin (LOVENOX) injection  40 mg Subcutaneous Q24H  . gabapentin  400 mg Oral BID  . mouth rinse  15 mL Mouth Rinse BID  . memantine  5 mg Oral BID  . mometasone-formoterol  2 puff Inhalation BID  . nystatin   Topical BID  . oxybutynin  10 mg Oral QHS  . pantoprazole  40 mg Oral BID AC  . polyethylene glycol  17 g Oral Daily  . pramipexole  0.125 mg Oral QHS  . sertraline  50 mg Oral Daily  . sulfamethoxazole-trimethoprim  1 tablet Oral Q12H  . tamsulosin  0.4 mg Oral Daily  . traZODone  200 mg Oral QHS   Continuous Infusions:   LOS: 3 days    Time spent: 30 minutes    Tenea Sens Darleen Crocker, DO [facility] Pager [phone]  If 7PM-7AM, please contact night-coverage [email or web address] Password [facility] 11/10/2017, 2:38 PM

## 2017-11-10 NOTE — Plan of Care (Signed)
  Problem: Acute Rehab PT Goals(only PT should resolve) Goal: Pt Will Go Supine/Side To Sit Outcome: Progressing Flowsheets (Taken 11/10/2017 1202) Pt will go Supine/Side to Sit: with moderate assist Goal: Patient Will Transfer Sit To/From Stand Outcome: Progressing Flowsheets (Taken 11/10/2017 1202) Patient will transfer sit to/from stand: with moderate assist Goal: Pt Will Transfer Bed To Chair/Chair To Bed Outcome: Progressing Flowsheets (Taken 11/10/2017 1202) Pt will Transfer Bed to Chair/Chair to Bed: with mod assist Goal: Pt Will Ambulate Outcome: Progressing Flowsheets (Taken 11/10/2017 1202) Pt will Ambulate: 15 feet; with moderate assist Note:  Hemi-walker   12:03 PM, 11/10/17 Lonell Grandchild, MPT Physical Therapist with [facility] [phone] office [phone] mobile phone

## 2017-11-10 NOTE — Clinical Social Work Note (Signed)
CSW consult received as pt admitted from [facility] and is in need of SNF rehab per PT. LCSW, Ambrose Pancoast, completed assessment and made SNF referrals. Pt had level II PASRR completed and he has his assigned number. Plan is for dc to [facility] later today or tomorrow.   This patient was hospitalized here at the end of last month and the full assessment from that admission is copied below.    Clinical Social Work Assessment  Patient Details  Name: Anthony [name] MRN: [id number] Date of Birth: 04/30/1940  Date of referral:  10/11/17               Reason for consult:  Other (Comment Required)(From [facility])                      Permission sought to share information with:    Permission granted to share information::                Name::                   Agency::  [name] at [employer]             Relationship::                Contact Information:     Housing/Transportation Living arrangements for the past 2 months:  [place], [place], [place] of Information:  Facility Patient Interpreter Needed:  None Criminal Activity/Legal Involvement Pertinent to Current Situation/Hospitalization:  No - Comment as needed Significant Relationships:  Other(Comment)(DSS Guardian) Lives with:  Facility Resident Do you feel safe going back to the place where you live?  Yes Need for family participation in patient care:  No (Coment)  Care giving concerns:  None identified. Facility resident.    Social Worker assessment / plan:  Patient has been a resident at [facility] since 10/20/16. His guardian is Netta Cedars with [facility] (RN left message regarding hospitalization). Patient uses a wheelchair and can help with transfers. Patient does not walk by himself.  He can return to the facility at discharge.    Employment status:  Retired Forensic scientist:  Medicare PT Recommendations:  Not assessed at this time Information / Referral to community  resources:     Patient/Family's Response to care:  Message left for legal guardian.   Patient/Family's Understanding of and Emotional Response to Diagnosis, Current Treatment, and Prognosis:  RN left message for legal guardian.   Emotional Assessment Appearance:  Appears stated age Attitude/Demeanor/Rapport:    Affect (typically observed):  Unable to Assess Orientation:  Oriented to Self, Oriented to Place Alcohol / Substance use:  Not Applicable Psych involvement (Current and /or in the community):  No (Comment)  Discharge Needs  Concerns to be addressed:  Other (Comment Required(return to facility ) Readmission within the last 30 days:  No Current discharge risk:  None Barriers to Discharge:  No Barriers Identified   Ihor Gully, LCSW 10/11/2017, 10:38 AM         Electronically signed by Ihor Gully, LCSW at 10/11/2017 10:41 AM     ED to Hosp-Admission (Discharged) on 10/10/2017       Detailed Report

## 2017-11-10 NOTE — Evaluation (Signed)
Physical Therapy Evaluation Patient Details Name: Anthony [name] MRN: [id number] DOB: 07-06-1940 Today's Date: 11/10/2017   History of Present Illness  Anthony [name]  is a 77 y.o. male, with history of CKD stage III, hydronephrosis status post  Foley catheter insertion 3 weeks ago, COPD, dementia was brought to the hospital from skilled nursing facility for redness and excoriation around genital area.  Patient had Foley catheter placement as above 3 weeks ago and with urine leaking around Foley catheter patient developed redness around the genital area.    Clinical Impression  Patient had difficulty sitting up at bedside, scooting forward due to weakness and severe right shoulder pain with movement, unable to come to complete stand using RW requiring stand pivot transfer to sit up in chair.  Patient will benefit from continued physical therapy in hospital and recommended venue below to increase strength, balance, endurance for safe ADLs and gait.    Follow Up Recommendations SNF;Supervision/Assistance - 24 hour    Equipment Recommendations  None recommended by PT    Recommendations for Other Services       Precautions / Restrictions Precautions Precautions: Fall Restrictions Weight Bearing Restrictions: No      Mobility  Bed Mobility Overal bed mobility: Needs Assistance Bed Mobility: Supine to Sit;Sit to Supine     Supine to sit: Mod assist;Max assist Sit to supine: Mod assist;Max assist   General bed mobility comments: slow labored movement, Max assist to scoot to bedside  Transfers Overall transfer level: Needs assistance Equipment used: 1 person hand held assist Transfers: Sit to/from Stand;Stand Pivot Transfers Sit to Stand: Max assist Stand pivot transfers: Max assist       General transfer comment: patient unable to stand/transfer using RW, required stand pivot to transfer to chair  Ambulation/Gait                Stairs            Wheelchair  Mobility    Modified Rankin (Stroke Patients Only)       Balance Overall balance assessment: Needs assistance Sitting-balance support: Feet supported;No upper extremity supported Sitting balance-Leahy Scale: Good     Standing balance support: Bilateral upper extremity supported;During functional activity Standing balance-Leahy Scale: Poor Standing balance comment: with RLE                             Pertinent Vitals/Pain Pain Assessment: Faces Faces Pain Scale: Hurts whole lot Pain Location: right shoulder with movement Pain Descriptors / Indicators: Sharp;Sore;Grimacing;Guarding Pain Intervention(s): Limited activity within patient's tolerance;Monitored during session    Home Living Family/patient expects to be discharged to:: Assisted living               Home Equipment: Wheelchair - manual      Prior Function Level of Independence: Needs assistance   Gait / Transfers Assistance Needed: assisted transfers and short distanced ambulation with RW per patient  ADL's / Homemaking Assistance Needed: assisted by ALF staff        Hand Dominance        Extremity/Trunk Assessment   Upper Extremity Assessment Upper Extremity Assessment: Generalized weakness;RUE deficits/detail;LUE deficits/detail RUE Deficits / Details: grossly 2+/5 due to right shoulder pain LUE Deficits / Details: grossly 4/5    Lower Extremity Assessment Lower Extremity Assessment: Generalized weakness    Cervical / Trunk Assessment Cervical / Trunk Assessment: Normal  Communication   Communication: No difficulties  Cognition Arousal/Alertness: Awake/alert Behavior  During Therapy: WFL for tasks assessed/performed Overall Cognitive Status: History of cognitive impairments - at baseline                                        General Comments      Exercises     Assessment/Plan    PT Assessment Patient needs continued PT services  PT Problem List  Decreased strength;Decreased activity tolerance;Decreased balance;Decreased mobility;Decreased range of motion;Pain(decreased shoulder ROM 50% due to pain)       PT Treatment Interventions Gait training;Functional mobility training;Therapeutic activities;Therapeutic exercise;Patient/family education;Wheelchair mobility training    PT Goals (Current goals can be found in the Care Plan section)  Acute Rehab PT Goals Patient Stated Goal: return home Time For Goal Achievement: 11/24/17 Potential to Achieve Goals: Good    Frequency Min 3X/week   Barriers to discharge        Co-evaluation               AM-PAC PT "6 Clicks" Daily Activity  Outcome Measure Difficulty turning over in bed (including adjusting bedclothes, sheets and blankets)?: A Lot Difficulty moving from lying on back to sitting on the side of the bed? : A Lot Difficulty sitting down on and standing up from a chair with arms (e.g., wheelchair, bedside commode, etc,.)?: A Lot Help needed moving to and from a bed to chair (including a wheelchair)?: A Lot Help needed walking in hospital room?: Total Help needed climbing 3-5 steps with a railing? : Total 6 Click Score: 10    End of Session   Activity Tolerance: Patient limited by fatigue;Patient limited by pain Patient left: in chair;with call bell/phone within reach;with chair alarm set Nurse Communication: Mobility status PT Visit Diagnosis: Unsteadiness on feet (R26.81);Other abnormalities of gait and mobility (R26.89);Muscle weakness (generalized) (M62.81)    Time: 6301-6010 PT Time Calculation (min) (ACUTE ONLY): 33 min   Charges:   PT Evaluation $PT Eval Moderate Complexity: 1 Mod PT Treatments $Therapeutic Activity: 23-37 mins        12:01 PM, 11/10/17 Lonell Grandchild, MPT Physical Therapist with [facility] [phone] office [phone] mobile phone

## 2017-11-10 NOTE — Clinical Social Work Note (Signed)
LCSW provided status update to [employer], guardian, [facility] DSS.     Jorje Vanatta, Clydene Pugh, LCSW

## 2017-11-11 DIAGNOSIS — R1312 Dysphagia, oropharyngeal phase: Secondary | ICD-10-CM | POA: Diagnosis not present

## 2017-11-11 DIAGNOSIS — M25522 Pain in left elbow: Secondary | ICD-10-CM | POA: Diagnosis not present

## 2017-11-11 DIAGNOSIS — N1330 Unspecified hydronephrosis: Secondary | ICD-10-CM | POA: Diagnosis not present

## 2017-11-11 DIAGNOSIS — R41841 Cognitive communication deficit: Secondary | ICD-10-CM | POA: Diagnosis not present

## 2017-11-11 DIAGNOSIS — I700 Atherosclerosis of aorta: Secondary | ICD-10-CM | POA: Diagnosis not present

## 2017-11-11 DIAGNOSIS — J449 Chronic obstructive pulmonary disease, unspecified: Secondary | ICD-10-CM | POA: Diagnosis not present

## 2017-11-11 DIAGNOSIS — G990 Autonomic neuropathy in diseases classified elsewhere: Secondary | ICD-10-CM | POA: Diagnosis not present

## 2017-11-11 DIAGNOSIS — N4822 Cellulitis of corpus cavernosum and penis: Secondary | ICD-10-CM | POA: Diagnosis not present

## 2017-11-11 DIAGNOSIS — N401 Enlarged prostate with lower urinary tract symptoms: Secondary | ICD-10-CM | POA: Diagnosis not present

## 2017-11-11 DIAGNOSIS — N183 Chronic kidney disease, stage 3 (moderate): Secondary | ICD-10-CM | POA: Diagnosis not present

## 2017-11-11 DIAGNOSIS — R338 Other retention of urine: Secondary | ICD-10-CM | POA: Diagnosis not present

## 2017-11-11 DIAGNOSIS — L03818 Cellulitis of other sites: Secondary | ICD-10-CM | POA: Diagnosis not present

## 2017-11-11 DIAGNOSIS — N320 Bladder-neck obstruction: Secondary | ICD-10-CM | POA: Diagnosis not present

## 2017-11-11 DIAGNOSIS — M19022 Primary osteoarthritis, left elbow: Secondary | ICD-10-CM | POA: Diagnosis not present

## 2017-11-11 DIAGNOSIS — F0390 Unspecified dementia without behavioral disturbance: Secondary | ICD-10-CM | POA: Diagnosis not present

## 2017-11-11 DIAGNOSIS — F339 Major depressive disorder, recurrent, unspecified: Secondary | ICD-10-CM | POA: Diagnosis not present

## 2017-11-11 DIAGNOSIS — F4312 Post-traumatic stress disorder, chronic: Secondary | ICD-10-CM | POA: Diagnosis not present

## 2017-11-11 DIAGNOSIS — F331 Major depressive disorder, recurrent, moderate: Secondary | ICD-10-CM | POA: Diagnosis not present

## 2017-11-11 DIAGNOSIS — M7521 Bicipital tendinitis, right shoulder: Secondary | ICD-10-CM | POA: Diagnosis not present

## 2017-11-11 DIAGNOSIS — G2581 Restless legs syndrome: Secondary | ICD-10-CM | POA: Diagnosis not present

## 2017-11-11 DIAGNOSIS — M6281 Muscle weakness (generalized): Secondary | ICD-10-CM | POA: Diagnosis not present

## 2017-11-11 DIAGNOSIS — F17211 Nicotine dependence, cigarettes, in remission: Secondary | ICD-10-CM | POA: Diagnosis not present

## 2017-11-11 DIAGNOSIS — K219 Gastro-esophageal reflux disease without esophagitis: Secondary | ICD-10-CM | POA: Diagnosis not present

## 2017-11-11 LAB — CBC
HEMATOCRIT: 28.2 % — AB (ref 39.0–52.0)
HEMOGLOBIN: 8.6 g/dL — AB (ref 13.0–17.0)
MCH: 29 pg (ref 26.0–34.0)
MCHC: 30.5 g/dL (ref 30.0–36.0)
MCV: 94.9 fL (ref 78.0–100.0)
Platelets: 232 10*3/uL (ref 150–400)
RBC: 2.97 MIL/uL — ABNORMAL LOW (ref 4.22–5.81)
RDW: 15.2 % (ref 11.5–15.5)
WBC: 6 10*3/uL (ref 4.0–10.5)

## 2017-11-11 LAB — BASIC METABOLIC PANEL
Anion gap: 7 (ref 5–15)
BUN: 22 mg/dL (ref 8–23)
CHLORIDE: 111 mmol/L (ref 98–111)
CO2: 21 mmol/L — AB (ref 22–32)
CREATININE: 1.67 mg/dL — AB (ref 0.61–1.24)
Calcium: 8.2 mg/dL — ABNORMAL LOW (ref 8.9–10.3)
GFR calc Af Amer: 43 mL/min — ABNORMAL LOW (ref >60)
GFR calc non Af Amer: 37 mL/min — ABNORMAL LOW (ref >60)
Glucose, Bld: 75 mg/dL (ref 70–99)
Potassium: 3.8 mmol/L (ref 3.5–5.1)
Sodium: 139 mmol/L (ref 135–145)

## 2017-11-11 MED ORDER — OXYCODONE HCL 5 MG PO TABS: 5.0000 mg | ORAL_TABLET | ORAL | 0 refills | Status: AC | PRN

## 2017-11-11 MED ORDER — OXYBUTYNIN CHLORIDE ER 10 MG PO TB24: 10.0000 mg | ORAL_TABLET | Freq: Every day | ORAL | 0 refills | Status: AC

## 2017-11-11 MED ORDER — NYSTATIN 100000 UNIT/GM EX POWD: Freq: Two times a day (BID) | CUTANEOUS | 0 refills | Status: DC

## 2017-11-11 MED ORDER — SULFAMETHOXAZOLE-TRIMETHOPRIM 800-160 MG PO TABS: 1.0000 | ORAL_TABLET | Freq: Two times a day (BID) | ORAL | 0 refills | Status: AC

## 2017-11-11 NOTE — Progress Notes (Signed)
IV removed, 2x2 gauze and paper tape applied to site, patient tolerated well.  Report called to Raeanne Gathers at [facility] in [place], [place] and all questions answered.  Patient awaiting transportation to [facility].

## 2017-11-11 NOTE — Clinical Social Work Placement (Addendum)
   CLINICAL SOCIAL WORK PLACEMENT  NOTE  Date:  11/11/2017  Patient Details  Name: Anthony [name] MRN: [id number] Date of Birth: 08-24-1940  Clinical Social Work is seeking post-discharge placement for this patient at the [place] level of care (*CSW will initial, date and re-position this form in  chart as items are completed):  Yes   Patient/family provided with [facility] Work Department's list of facilities offering this level of care within the geographic area requested by the patient (or if unable, by the patient's family).  Yes   Patient/family informed of their freedom to choose among providers that offer the needed level of care, that participate in Medicare, Medicaid or managed care program needed by the patient, have an available bed and are willing to accept the patient.  Yes   Patient/family informed of [facility]'s ownership interest in [facility] and [facility], as well as of the fact that they are under no obligation to receive care at these facilities.  PASRR submitted to EDS on 11/09/17     PASRR number received on 11/09/17     Existing PASRR number confirmed on       FL2 transmitted to all facilities in geographic area requested by pt/family on 11/09/17     FL2 transmitted to all facilities within larger geographic area on       Patient informed that his/her managed care company has contracts with or will negotiate with certain facilities, including the following:        Yes   Patient/family informed of bed offers received.  Patient chooses bed at [facility]     Physician recommends and patient chooses bed at      Patient to be transferred to [facility] on 11/11/17.  Patient to be transferred to facility by [facility]     Patient family notified on 11/11/17 of transfer.  Name of family member notified:  Rip Harbour spell, legal guardian. Message left     PHYSICIAN       Additional Comment:    Discharge clinicals sent.  LCSW signing off.   _______________________________________________ Ambrose Pancoast D, LCSW 11/11/2017, 11:00 AM

## 2017-11-11 NOTE — Discharge Summary (Signed)
Physician Discharge Summary  Anthony [name] DJM:[id number] DOB: 22-Jun-1940 DOA: 11/06/2017  PCP: Rosita Fire, MD  Admit date: 11/06/2017  Discharge date: 11/11/2017  Admitted From:Home  Disposition:  SNF  Recommendations for Outpatient Follow-up:  1. Follow up with PCP in 1-2 weeks 2. Follow-up with urology in 2 weeks  Home Health:N/A  Equipment/Devices:Foley  Discharge Condition:Stable  CODE STATUS: Full  Diet recommendation: Heart Healthy  Brief/Interim Summary:  77 year old man admitted from assisted living facility on 8/25 to surrounding his perineal area. He has a history of stage III chronic kidney disease and hydronephrosis status post Foley catheter insertion 3 weeks ago. He was brought in from his assisted living facility due to redness, excoriation and purulent drainage from the genital area.He was seen by urology and was maintained on IV antibiotics initially and transition to nystatin and Bactrim.  He is to continue Bactrim for another 4 days to complete the course of treatment and follow-up with urology in the next 2 weeks.  He will have his Foley catheter maintained in the meantime.  He continues to have pain to his right shoulder for which she has had an MRI demonstrating partial-thickness rotator cuff tears as well as tendinopathy.  He has had good pain control with OxyContin and Tylenol as needed.  He is to continue physical therapy at this time and follow-up with orthopedics in the outpatient setting for further follow-up.  Discharge Diagnoses:  Active Problems:   CKD (chronic kidney disease) stage 3, GFR 30-59 ml/min (HCC)   Dementia   Cellulitis    Discharge Instructions  Discharge Instructions    Diet - low sodium heart healthy   Complete by:  As directed    Increase activity slowly   Complete by:  As directed        Contact information for follow-up providers    Winter, Conception Oms, MD In 2 weeks.   Specialty:  Urology Contact  information: [address] [place] [address] [phone]        orthopedics. Schedule an appointment as soon as possible for a visit in 2 week(s).   Why:  R shoulder rotator cuff partial tear           Contact information for after-discharge care    [facility] .   Service:  Skilled Nursing Contact information: [address] [place] [phone]                  Consultations:  Urology   Procedures/Studies: Dg Chest 1 View  Result Date: 10/29/2017 CLINICAL DATA:  Pain following fall EXAM: CHEST  1 VIEW COMPARISON:  October 10, 2017 and June 19, 2017 FINDINGS: There are areas of mild scarring in each mid lower lung zone. There is no edema or consolidation. The heart size and pulmonary vascularity are normal. No adenopathy. There is aortic atherosclerosis. There is a stable mixed sclerotic and lytic lesion involving the proximal right humerus. IMPRESSION: Areas of scarring bilaterally. No edema or consolidation. Heart size normal. There is aortic atherosclerosis. Stable mixed sclerotic and lytic lesion in the proximal right humerus. Etiology uncertain. Aortic Atherosclerosis (ICD10-I70.0). Electronically Signed   By: Lowella Grip III M.D.   On: 10/29/2017 14:28   Dg Shoulder Right  Result Date: 11/07/2017 CLINICAL DATA:  Right shoulder pain EXAM: RIGHT SHOULDER - 2+ VIEW COMPARISON:  10/29/2017, 12/24/2016 FINDINGS: Negative for fracture.  Degenerative change in the [facility] joint. Mixed sclerotic  and lytic lesion in the proximal humerus is unchanged from prior studies and compatible with chondroid lesion. No pathologic fracture. IMPRESSION: Mixed sclerotic and lytic lesion proximal right humerus unchanged from radiographs. This is compatible with a chondroid lesion. Given the severe pain, MRI without and with contrast is suggested to evaluate for aggressive features. Electronically Signed   By:  Franchot Gallo M.D.   On: 11/07/2017 16:07   Dg Shoulder Right  Result Date: 10/29/2017 CLINICAL DATA:  Fall out of wheelchair. EXAM: RIGHT SHOULDER - 2+ VIEW COMPARISON:  Right shoulder x-rays dated October 20, 2017. FINDINGS: No acute fracture or dislocation. Moderate acromioclavicular osteoarthritis, similar to prior study. The glenohumeral joint space is relatively preserved. Unchanged lucent and sclerotic lesion in the proximal humerus. Osteopenia. Soft tissues are unremarkable. IMPRESSION: 1.  No acute osseous abnormality. 2. Unchanged chondroid lesion in the proximal right humerus. Electronically Signed   By: Titus Dubin M.D.   On: 10/29/2017 14:28   Dg Shoulder Right  Result Date: 10/20/2017 CLINICAL DATA:  Fall in shower at nursing facility with right shoulder injury. Dementia EXAM: RIGHT SHOULDER - 2+ VIEW COMPARISON:  12/24/2016 FINDINGS: Lucent and sclerotic lesion in the proximal right humerus characterized by MRI in 2018. No evidence of progression or degeneration. Osteopenia. No fracture or dislocation. Acromioclavicular degenerative spurring. IMPRESSION: 1. No acute finding. 2. 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   On: 10/20/2017 12:17   Ct Head Wo Contrast  Result Date: 10/20/2017 CLINICAL DATA:  Pain following fall EXAM: CT HEAD WITHOUT CONTRAST CT CERVICAL SPINE WITHOUT CONTRAST TECHNIQUE: Multidetector CT imaging of the head and cervical spine was performed following the standard protocol without intravenous contrast. Multiplanar CT image reconstructions of the cervical spine were also generated. COMPARISON:  CT head and CT cervical spine October 10, 2017 FINDINGS: CT HEAD FINDINGS Brain: Moderate diffuse atrophy is stable. There is no evident intracranial mass, hemorrhage, extra-axial fluid collection, or midline shift. There is slight small vessel disease in the centra semiovale bilaterally. Elsewhere gray-white  compartments are normal. No acute infarct evident. Vascular: No hyperdense vessel. There is calcification in each cavernous carotid artery region. Skull: The bony calvarium appears intact. Sinuses/Orbits: There is a retention cyst in the mid left ethmoid air cell region. There is mucosal thickening in several ethmoid air cells. Orbits appear symmetric bilaterally with the exception of previous cataract removal on the left. Other: Mastoid air cells are clear. There is debris in each external auditory canal. CT CERVICAL SPINE FINDINGS Alignment: There is 2 mm of anterolisthesis of C7 on T1, stable. No other spondylolisthesis is evident. Skull base and vertebrae: The skull base and craniocervical junction regions appear normal. There is mild pannus posterior to the odontoid, not causing appreciable impression on the craniocervical junction. No fracture is demonstrable. There are no blastic or lytic bone lesions. Soft tissues and spinal canal: Prevertebral soft tissues and predental space regions are normal. There is no paraspinous lesion. No cord or canal hematoma evident. Disc levels: There is marked disc space narrowing at C5-6 and C6-7. there is multilevel facet arthropathy. There is impression on the exiting nerve root on the left at C3-4, on the left at C4-5, at C5-6 bilaterally, and C6-7 bilaterally due to bony hypertrophy. There is no frank disc extrusion. There is borderline stenosis at C5-6. Upper chest: There is scarring in the lung apices with several small bullae in the right apex. Other: There is calcification  in each carotid artery. IMPRESSION: CT head: Atrophy with slight periventricular small vessel disease. No evident acute infarct. No mass or hemorrhage. There are foci of arterial vascular calcification. There are areas of ethmoid sinus disease. There is probable cerumen in each external auditory canal. CT cervical spine: No fracture. Slight spondylolisthesis at C7-T1 is stable and felt to be due to  underlying spondylosis. There is multilevel arthropathic change with impression on exiting nerve roots at several levels as noted. There is carotid artery calcification bilaterally. Electronically Signed   By: Lowella Grip III M.D.   On: 10/20/2017 13:31   Ct Cervical Spine Wo Contrast  Result Date: 10/20/2017 CLINICAL DATA:  Pain following fall EXAM: CT HEAD WITHOUT CONTRAST CT CERVICAL SPINE WITHOUT CONTRAST TECHNIQUE: Multidetector CT imaging of the head and cervical spine was performed following the standard protocol without intravenous contrast. Multiplanar CT image reconstructions of the cervical spine were also generated. COMPARISON:  CT head and CT cervical spine October 10, 2017 FINDINGS: CT HEAD FINDINGS Brain: Moderate diffuse atrophy is stable. There is no evident intracranial mass, hemorrhage, extra-axial fluid collection, or midline shift. There is slight small vessel disease in the centra semiovale bilaterally. Elsewhere gray-white compartments are normal. No acute infarct evident. Vascular: No hyperdense vessel. There is calcification in each cavernous carotid artery region. Skull: The bony calvarium appears intact. Sinuses/Orbits: There is a retention cyst in the mid left ethmoid air cell region. There is mucosal thickening in several ethmoid air cells. Orbits appear symmetric bilaterally with the exception of previous cataract removal on the left. Other: Mastoid air cells are clear. There is debris in each external auditory canal. CT CERVICAL SPINE FINDINGS Alignment: There is 2 mm of anterolisthesis of C7 on T1, stable. No other spondylolisthesis is evident. Skull base and vertebrae: The skull base and craniocervical junction regions appear normal. There is mild pannus posterior to the odontoid, not causing appreciable impression on the craniocervical junction. No fracture is demonstrable. There are no blastic or lytic bone lesions. Soft tissues and spinal canal: Prevertebral soft tissues and  predental space regions are normal. There is no paraspinous lesion. No cord or canal hematoma evident. Disc levels: There is marked disc space narrowing at C5-6 and C6-7. there is multilevel facet arthropathy. There is impression on the exiting nerve root on the left at C3-4, on the left at C4-5, at C5-6 bilaterally, and C6-7 bilaterally due to bony hypertrophy. There is no frank disc extrusion. There is borderline stenosis at C5-6. Upper chest: There is scarring in the lung apices with several small bullae in the right apex. Other: There is calcification in each carotid artery. IMPRESSION: CT head: Atrophy with slight periventricular small vessel disease. No evident acute infarct. No mass or hemorrhage. There are foci of arterial vascular calcification. There are areas of ethmoid sinus disease. There is probable cerumen in each external auditory canal. CT cervical spine: No fracture. Slight spondylolisthesis at C7-T1 is stable and felt to be due to underlying spondylosis. There is multilevel arthropathic change with impression on exiting nerve roots at several levels as noted. There is carotid artery calcification bilaterally. Electronically Signed   By: Lowella Grip III M.D.   On: 10/20/2017 13:31   US Renal  Result Date: 11/08/2017 CLINICAL DATA:  Acute renal insufficiency, chronic kidney disease stage 3, urinary tract infection EXAM: RENAL / URINARY TRACT ULTRASOUND COMPLETE COMPARISON:  CT abdomen pelvis of 10/13/2017 FINDINGS: Right Kidney: Length: 9.0 cm. There is mild fullness the echogenicity of the  renal parenchyma is within normal limits. Of the right pelvocaliceal system. Left Kidney: Length: 9.6 cm. No hydronephrosis is seen. A trace amount of fluid is noted at the tip of the left kidney. Bladder: The urinary bladder is completely decompressed and cannot be evaluated. IMPRESSION: 1. Mild fullness of the right pelvocaliceal system. 2. The renal parenchyma appears unremarkable bilaterally. 3. The  urinary bladder is completely decompressed. Electronically Signed   By: Ivar Drape M.D.   On: 11/08/2017 13:06   US Renal  Result Date: 10/12/2017 CLINICAL DATA:  Acute on chronic kidney disease. History of kidney stones. EXAM: RENAL / URINARY TRACT ULTRASOUND COMPLETE COMPARISON:  Abdominal and pelvic CT scan dated December 24, 2016 FINDINGS: Right Kidney: Length: 10 cm. The renal cortical echotexture remains slightly lower than that of the liver. There is moderate hydronephrosis. No discrete stones are observed. Left Kidney: Length: 9.6 cm. The renal cortical echotexture is similar to that on the right. There is moderate hydronephrosis. The proximal ureter is dilated as far as it can be followed. Bladder: The urinary bladder is moderately distended with a calculated volume of 277 cc. The patient has a condylar catheter on. IMPRESSION: Bilateral hydronephrosis slightly greater on the left than on the right. Mildly increased renal cortical echotexture consistent with chronic renal disease. Distended urinary bladder.  A Foley catheter may be useful. Electronically Signed   By: David  Martinique M.D.   On: 10/12/2017 15:19   Mr Shoulder Right Wo Contrast  Result Date: 11/08/2017 CLINICAL DATA:  Right shoulder pain and decreased range of motion. History of proximal humerus chondroid lesion. EXAM: MRI OF THE RIGHT SHOULDER WITHOUT CONTRAST TECHNIQUE: Multiplanar, multisequence MR imaging of the shoulder was performed. No intravenous contrast was administered. COMPARISON:  MRI right humerus dated March 04, 2017. FINDINGS: Rotator cuff: High-grade partial-thickness articular surface tear of the supraspinatus tendon. There may be a small full-thickness perforation of the distal tendon at the footprint. The infraspinatus, subscapularis, and teres minor tendons are intact. Muscles: Mild diffuse rotator cuff muscle atrophy. No muscle edema. Biceps long head:  Intact and normally positioned. Acromioclavicular Joint:  Mild arthropathy of the acromioclavicular joint. Type I acromion. Small subacromial/subdeltoid bursal fluid. Glenohumeral Joint: Diffuse cartilage thinning without focal defect. Small joint effusion with synovitis. Labrum:  Degenerative tearing of the superior labrum. Bones: Grossly unchanged heterogeneous lesion in the proximal right humeral diaphysis with areas of T2 hyperintensity as well as T1 and T2 hypointensity. No surrounding marrow edema. No cortical disruption. No acute fracture or dislocation. Other: None. IMPRESSION: 1. High-grade partial-thickness articular surface tear of the supraspinatus tendon with probable small full-thickness perforation of the distal tendon at the footprint. 2. Mild diffuse rotator cuff muscle atrophy. 3. Degenerative tearing of the superior labrum. 4. 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 5. Mild acromioclavicular and glenohumeral osteoarthritis. Electronically Signed   By: Titus Dubin M.D.   On: 11/08/2017 19:12   Dg Chest Port 1 View  Result Date: 11/07/2017 CLINICAL DATA:  Sepsis EXAM: PORTABLE CHEST 1 VIEW COMPARISON:  10/29/2017 FINDINGS: Chronic changes/scarring in the lungs bilaterally. Heart is normal size. No acute confluent airspace opacities, effusions or edema. No acute bony abnormality. IMPRESSION: Chronic changes.  No active disease. Electronically Signed   By: Rolm Baptise M.D.   On: 11/07/2017 00:34   Ct Renal Stone Study  Result Date: 10/13/2017 CLINICAL DATA:  Acute renal failure, concern for renal stone. EXAM: CT ABDOMEN AND PELVIS WITHOUT CONTRAST TECHNIQUE:  Multidetector CT imaging of the abdomen and pelvis was performed following the standard protocol without IV contrast. COMPARISON:  Abdominal ultrasound 10/12/2017, CT 12/24/2016 FINDINGS: Lower chest: Normal heart size without pericardial effusion. Small pleural effusions left greater than right with adjacent atelectasis.  Hepatobiliary: Given limitations of a noncontrast study, no acute solid organ pathology noted of the liver. No biliary dilatation. Normal gallbladder without stones. Pancreas: Inflammation, ductal dilatation or mass. Spleen: No splenomegaly or mass. Adrenals/Urinary Tract: Normal bilateral adrenal glands. Atrophic appearance of the kidneys. Moderate bilateral hydroureteronephrosis left greater than right without obstructive uropathy. The urinary bladder is decompressed by Foley catheter and somewhat thick-walled in appearance as a result. No definite intraluminal mass is identified. No ureteral calculi. Stomach/Bowel: Physiologic distention of the stomach with normal small bowel rotation. No bowel obstruction. Increased colonic stool retention throughout the colon consistent with constipation. Probable fecal impaction in the rectum with mild mural thickening suggestive of a stercoral proctitis. Vascular/Lymphatic: Infrarenal abdominal aortic aneurysm measuring 3 cm transverse with moderate atherosclerosis of the aorta and branch vessels. No adenopathy. Reproductive: Normal size prostate and seminal vesicles. Other: Ventral hernia repair deep to the umbilicus. Musculoskeletal: Grade 1 bordering grade 2 anterolisthesis of L5 on S1 with transitional vertebral anatomy. L5 spondylolysis. Lumbar spondylosis is noted as before. IMPRESSION: 1. Moderate bilateral hydroureteronephrosis without obstructing source identified. Ureters can be followed to the level of the urinary bladder which is decompressed by Foley catheter and somewhat thick-walled. Ureteral stenosis or potentially a mucosal abnormality of the ureters or bladder causing bilateral hydroureteronephrosis are some possible considerations. 2. Small bilateral pleural effusions with atelectasis, left greater than right. 3. Increased stool retention throughout the colon consistent constipation probable fecal impaction in the rectum. Mild mural thickening of the rectum  raises concern for stercoral proctitis. 4. Stable infrarenal abdominal aortic aneurysm measuring approximately 3 cm transverse. 5. Lumbar spondylosis with spondylolysis of L5 and grade 1 bordering on grade 2 spondylolisthesis. Electronically Signed   By: Ashley Royalty M.D.   On: 10/13/2017 19:48   Dg Hip Unilat W Or Wo Pelvis 2-3 Views Left  Result Date: 10/23/2017 CLINICAL DATA:  Posterior lateral hip pain EXAM: DG HIP (WITH OR WITHOUT PELVIS) 2-3V LEFT COMPARISON:  None. FINDINGS: Generalized osteopenia. No hip fracture or dislocation. Right intramedullary nail with interlocking femoral neck screw partially visualized. Mild osteoarthritis of bilateral hips. Partially visualized is lumbar spine spondylosis. IMPRESSION: 1. No acute osseous injury of the left hip. Given the patient's age and osteopenia, if there is persistent clinical concern for an occult hip fracture, a MRI of the hip is recommended for increased sensitivity. 2. Mild osteoarthritis of bilateral hips. Electronically Signed   By: Kathreen Devoid   On: 10/23/2017 17:11      Subjective:   Discharge Exam: Vitals:   11/11/17 0715 11/11/17 0752  BP: 130/63   Pulse: 79   Resp: 18   Temp: 98.1 F (36.7 C)   SpO2: 96% 95%   Vitals:   11/10/17 2016 11/10/17 2231 11/11/17 0715 11/11/17 0752  BP:  122/62 130/63   Pulse:  69 79   Resp:  20 18   Temp:  98.1 F (36.7 C) 98.1 F (36.7 C)   TempSrc:  Oral Oral   SpO2: 92% 94% 96% 95%  Weight:      Height:        General: Pt is alert, awake, not in acute distress Cardiovascular: RRR, S1/S2 +, no rubs, no gallops Respiratory: CTA bilaterally, no wheezing, no  rhonchi Abdominal: Soft, NT, ND, bowel sounds + Extremities: no edema, no cyanosis    The results of significant diagnostics from this hospitalization (including imaging, microbiology, ancillary and laboratory) are listed below for reference.     Microbiology: Recent Results (from the past 240 hour(s))  Blood Culture  (routine x 2)     Status: None (Preliminary result)   Collection Time: 11/07/17 12:10 AM  Result Value Ref Range Status   Specimen Description BLOOD RIGHT ARM  Final   Special Requests   Final    BOTTLES DRAWN AEROBIC AND ANAEROBIC Blood Culture adequate volume   Culture   Final    NO GROWTH 4 DAYS Performed at [facility], [address], [place], [place] [address]    Report Status PENDING  Incomplete  Blood Culture (routine x 2)     Status: None (Preliminary result)   Collection Time: 11/07/17 12:25 AM  Result Value Ref Range Status   Specimen Description LEFT ANTECUBITAL  Final   Special Requests   Final    BOTTLES DRAWN AEROBIC AND ANAEROBIC Blood Culture results may not be optimal due to an excessive volume of blood received in culture bottles   Culture   Final    NO GROWTH 4 DAYS Performed at [facility], [address] [place], [place] [address]    Report Status PENDING  Incomplete  Wound or Superficial Culture     Status: None   Collection Time: 11/07/17  2:25 AM  Result Value Ref Range Status   Specimen Description   Final    PENIS Performed at [facility], [address], [place], [place] [address]    Special Requests PROCESS AS A WOUND CULTURE PER MD  Final   Gram Stain   Final    RARE WBC PRESENT, PREDOMINANTLY PMN ABUNDANT GRAM POSITIVE COCCI FEW GRAM NEGATIVE RODS FEW GRAM POSITIVE RODS    Culture   Final    FEW NORMAL SKIN FLORA Performed at [facility], [place] [address], [place], [place] [address]    Report Status 11/10/2017 FINAL  Final  MRSA PCR Screening     Status: None   Collection Time: 11/07/17  5:08 AM  Result Value Ref Range Status   MRSA by PCR NEGATIVE NEGATIVE Final    Comment:        The GeneXpert MRSA Assay (FDA approved for NASAL specimens only), is one component of a comprehensive MRSA colonization surveillance program. It is not intended to diagnose MRSA infection nor to guide or monitor treatment for MRSA  infections. Performed at [facility], [address] [place],  [address]      Labs: BNP (last 3 results) Recent Labs    10/10/17 1416  BNP 69.6   Basic Metabolic Panel: Recent Labs  Lab 11/07/17 0010 11/09/17 0419 11/11/17 0506  NA 137 138 139  K 4.2 4.4 3.8  CL 106 111 111  CO2 23 21* 21*  GLUCOSE 115* 93 75  BUN 41* 34* 22  CREATININE 2.28* 1.72* 1.67*  CALCIUM 8.5* 7.9* 8.2*   Liver Function Tests: Recent Labs  Lab 11/07/17 0010  AST 15  ALT 12  ALKPHOS 75  BILITOT 0.4  PROT 8.0  ALBUMIN 3.0*   No results for input(s): LIPASE, AMYLASE in the last 168 hours. No results for input(s): AMMONIA in the last 168 hours. CBC: Recent Labs  Lab 11/07/17 0010 11/09/17 0419 11/11/17 0506  WBC 13.7* 6.0 6.0  NEUTROABS 11.1*  --   --  HGB 10.2* 8.3* 8.6*  HCT 32.4* 27.0* 28.2*  MCV 95.0 95.1 94.9  PLT 292 242 232   Cardiac Enzymes: No results for input(s): CKTOTAL, CKMB, CKMBINDEX, TROPONINI in the last 168 hours. BNP: Invalid input(s): POCBNP CBG: No results for input(s): GLUCAP in the last 168 hours. D-Dimer No results for input(s): DDIMER in the last 72 hours. Hgb A1c No results for input(s): HGBA1C in the last 72 hours. Lipid Profile No results for input(s): CHOL, HDL, LDLCALC, TRIG, CHOLHDL, LDLDIRECT in the last 72 hours. Thyroid function studies No results for input(s): TSH, T4TOTAL, T3FREE, THYROIDAB in the last 72 hours.  Invalid input(s): FREET3 Anemia work up No results for input(s): VITAMINB12, FOLATE, FERRITIN, TIBC, IRON, RETICCTPCT in the last 72 hours. Urinalysis    Component Value Date/Time   COLORURINE YELLOW 11/07/2017 0225   APPEARANCEUR CLOUDY (A) 11/07/2017 0225   LABSPEC 1.016 11/07/2017 0225   PHURINE 5.0 11/07/2017 0225   GLUCOSEU NEGATIVE 11/07/2017 0225   HGBUR NEGATIVE 11/07/2017 0225   BILIRUBINUR NEGATIVE 11/07/2017 0225   KETONESUR NEGATIVE 11/07/2017 0225   PROTEINUR NEGATIVE 11/07/2017 0225   UROBILINOGEN  1.0 12/23/2014 1853   NITRITE NEGATIVE 11/07/2017 0225   LEUKOCYTESUR NEGATIVE 11/07/2017 0225   Sepsis Labs Invalid input(s): PROCALCITONIN,  WBC,  LACTICIDVEN Microbiology Recent Results (from the past 240 hour(s))  Blood Culture (routine x 2)     Status: None (Preliminary result)   Collection Time: 11/07/17 12:10 AM  Result Value Ref Range Status   Specimen Description BLOOD RIGHT ARM  Final   Special Requests   Final    BOTTLES DRAWN AEROBIC AND ANAEROBIC Blood Culture adequate volume   Culture   Final    NO GROWTH 4 DAYS Performed at [facility], [address], [place], [place] [address]    Report Status PENDING  Incomplete  Blood Culture (routine x 2)     Status: None (Preliminary result)   Collection Time: 11/07/17 12:25 AM  Result Value Ref Range Status   Specimen Description LEFT ANTECUBITAL  Final   Special Requests   Final    BOTTLES DRAWN AEROBIC AND ANAEROBIC Blood Culture results may not be optimal due to an excessive volume of blood received in culture bottles   Culture   Final    NO GROWTH 4 DAYS Performed at [facility], [address] [place], [place] [address]    Report Status PENDING  Incomplete  Wound or Superficial Culture     Status: None   Collection Time: 11/07/17  2:25 AM  Result Value Ref Range Status   Specimen Description   Final    PENIS Performed at [facility], [address], [place], [place] [address]    Special Requests PROCESS AS A WOUND CULTURE PER MD  Final   Gram Stain   Final    RARE WBC PRESENT, PREDOMINANTLY PMN ABUNDANT GRAM POSITIVE COCCI FEW GRAM NEGATIVE RODS FEW GRAM POSITIVE RODS    Culture   Final    FEW NORMAL SKIN FLORA Performed at [facility], [place] [address] [place],  [address]    Report Status 11/10/2017 FINAL  Final  MRSA PCR Screening     Status: None   Collection Time: 11/07/17  5:08 AM  Result Value Ref Range Status   MRSA by PCR NEGATIVE NEGATIVE Final    Comment:        The GeneXpert  MRSA Assay (FDA approved for NASAL specimens only), is one component of a comprehensive  MRSA colonization surveillance program. It is not intended to diagnose MRSA infection nor to guide or monitor treatment for MRSA infections. Performed at [facility], [address] [place], [place] [address]      Time coordinating discharge: 35 minutes  SIGNED:   Rodena Goldmann, DO [facility] 11/11/2017, 10:21 AM Pager [phone]  If 7PM-7AM, please contact night-coverage [email or web address] Password [email or web address]

## 2017-11-11 NOTE — Care Management Important Message (Addendum)
Important Message  Patient Details  Name: Anthony [name] MRN: [id number] Date of Birth: 08/10/1940   Medicare Important Message Given:  Yes Pt. Unable to sign due to medial condition.   Holli Humbles Kantor 11/11/2017, 11:09 AM

## 2017-11-12 LAB — CULTURE, BLOOD (ROUTINE X 2)
CULTURE: NO GROWTH
CULTURE: NO GROWTH
Special Requests: ADEQUATE

## 2017-11-16 DIAGNOSIS — I700 Atherosclerosis of aorta: Secondary | ICD-10-CM | POA: Diagnosis not present

## 2017-11-16 DIAGNOSIS — N4822 Cellulitis of corpus cavernosum and penis: Secondary | ICD-10-CM | POA: Diagnosis not present

## 2017-11-16 DIAGNOSIS — N1330 Unspecified hydronephrosis: Secondary | ICD-10-CM | POA: Diagnosis not present

## 2017-11-16 DIAGNOSIS — N183 Chronic kidney disease, stage 3 (moderate): Secondary | ICD-10-CM | POA: Diagnosis not present

## 2017-11-18 DIAGNOSIS — N183 Chronic kidney disease, stage 3 (moderate): Secondary | ICD-10-CM | POA: Diagnosis not present

## 2017-11-18 DIAGNOSIS — J449 Chronic obstructive pulmonary disease, unspecified: Secondary | ICD-10-CM | POA: Diagnosis not present

## 2017-11-18 DIAGNOSIS — N4822 Cellulitis of corpus cavernosum and penis: Secondary | ICD-10-CM | POA: Diagnosis not present

## 2017-11-18 DIAGNOSIS — F0390 Unspecified dementia without behavioral disturbance: Secondary | ICD-10-CM | POA: Diagnosis not present

## 2017-11-23 DIAGNOSIS — N1330 Unspecified hydronephrosis: Secondary | ICD-10-CM | POA: Diagnosis not present

## 2017-11-23 DIAGNOSIS — N4822 Cellulitis of corpus cavernosum and penis: Secondary | ICD-10-CM | POA: Diagnosis not present

## 2017-11-28 DIAGNOSIS — N4822 Cellulitis of corpus cavernosum and penis: Secondary | ICD-10-CM | POA: Diagnosis not present

## 2017-11-28 DIAGNOSIS — N1330 Unspecified hydronephrosis: Secondary | ICD-10-CM | POA: Diagnosis not present

## 2017-11-30 ENCOUNTER — Ambulatory Visit (INDEPENDENT_AMBULATORY_CARE_PROVIDER_SITE_OTHER): Payer: Medicare Other | Admitting: Urology

## 2017-11-30 DIAGNOSIS — N401 Enlarged prostate with lower urinary tract symptoms: Secondary | ICD-10-CM | POA: Diagnosis not present

## 2017-11-30 DIAGNOSIS — M25522 Pain in left elbow: Secondary | ICD-10-CM | POA: Diagnosis not present

## 2017-12-07 ENCOUNTER — Other Ambulatory Visit: Payer: Self-pay | Admitting: Urology

## 2017-12-07 ENCOUNTER — Ambulatory Visit (INDEPENDENT_AMBULATORY_CARE_PROVIDER_SITE_OTHER): Payer: Medicare Other | Admitting: Urology

## 2017-12-07 DIAGNOSIS — N401 Enlarged prostate with lower urinary tract symptoms: Secondary | ICD-10-CM

## 2017-12-07 DIAGNOSIS — N138 Other obstructive and reflux uropathy: Secondary | ICD-10-CM

## 2017-12-07 DIAGNOSIS — N4822 Cellulitis of corpus cavernosum and penis: Secondary | ICD-10-CM | POA: Diagnosis not present

## 2017-12-07 DIAGNOSIS — N1330 Unspecified hydronephrosis: Secondary | ICD-10-CM | POA: Diagnosis not present

## 2017-12-07 DIAGNOSIS — R338 Other retention of urine: Secondary | ICD-10-CM | POA: Diagnosis not present

## 2017-12-14 ENCOUNTER — Other Ambulatory Visit: Payer: Self-pay | Admitting: Radiology

## 2017-12-14 DIAGNOSIS — M2042 Other hammer toe(s) (acquired), left foot: Secondary | ICD-10-CM | POA: Diagnosis not present

## 2017-12-14 DIAGNOSIS — N1330 Unspecified hydronephrosis: Secondary | ICD-10-CM | POA: Diagnosis not present

## 2017-12-14 DIAGNOSIS — B351 Tinea unguium: Secondary | ICD-10-CM | POA: Diagnosis not present

## 2017-12-14 DIAGNOSIS — N183 Chronic kidney disease, stage 3 (moderate): Secondary | ICD-10-CM | POA: Diagnosis not present

## 2017-12-14 DIAGNOSIS — M7521 Bicipital tendinitis, right shoulder: Secondary | ICD-10-CM | POA: Diagnosis not present

## 2017-12-14 DIAGNOSIS — I700 Atherosclerosis of aorta: Secondary | ICD-10-CM | POA: Diagnosis not present

## 2017-12-14 DIAGNOSIS — M79675 Pain in left toe(s): Secondary | ICD-10-CM | POA: Diagnosis not present

## 2017-12-15 ENCOUNTER — Other Ambulatory Visit: Payer: Self-pay

## 2017-12-15 ENCOUNTER — Encounter (HOSPITAL_COMMUNITY): Payer: Self-pay

## 2017-12-15 ENCOUNTER — Ambulatory Visit (HOSPITAL_COMMUNITY)
Admission: RE | Admit: 2017-12-15 | Discharge: 2017-12-15 | Disposition: A | Discharge status: Home / Self Care | Payer: Medicare Other | Source: Ambulatory Visit | Attending: Urology | Admitting: Urology

## 2017-12-15 ENCOUNTER — Other Ambulatory Visit: Payer: Self-pay | Admitting: Interventional Radiology

## 2017-12-15 DIAGNOSIS — G8929 Other chronic pain: Secondary | ICD-10-CM | POA: Insufficient documentation

## 2017-12-15 DIAGNOSIS — R918 Other nonspecific abnormal finding of lung field: Secondary | ICD-10-CM | POA: Insufficient documentation

## 2017-12-15 DIAGNOSIS — N138 Other obstructive and reflux uropathy: Secondary | ICD-10-CM

## 2017-12-15 DIAGNOSIS — K219 Gastro-esophageal reflux disease without esophagitis: Secondary | ICD-10-CM | POA: Insufficient documentation

## 2017-12-15 DIAGNOSIS — M25559 Pain in unspecified hip: Secondary | ICD-10-CM | POA: Diagnosis not present

## 2017-12-15 DIAGNOSIS — F329 Major depressive disorder, single episode, unspecified: Secondary | ICD-10-CM | POA: Insufficient documentation

## 2017-12-15 DIAGNOSIS — N400 Enlarged prostate without lower urinary tract symptoms: Secondary | ICD-10-CM | POA: Diagnosis not present

## 2017-12-15 DIAGNOSIS — Z87891 Personal history of nicotine dependence: Secondary | ICD-10-CM | POA: Insufficient documentation

## 2017-12-15 DIAGNOSIS — Z79899 Other long term (current) drug therapy: Secondary | ICD-10-CM | POA: Diagnosis not present

## 2017-12-15 DIAGNOSIS — Z7982 Long term (current) use of aspirin: Secondary | ICD-10-CM | POA: Insufficient documentation

## 2017-12-15 DIAGNOSIS — N401 Enlarged prostate with lower urinary tract symptoms: Secondary | ICD-10-CM | POA: Insufficient documentation

## 2017-12-15 DIAGNOSIS — Z7951 Long term (current) use of inhaled steroids: Secondary | ICD-10-CM | POA: Diagnosis not present

## 2017-12-15 DIAGNOSIS — G629 Polyneuropathy, unspecified: Secondary | ICD-10-CM | POA: Insufficient documentation

## 2017-12-15 DIAGNOSIS — F0390 Unspecified dementia without behavioral disturbance: Secondary | ICD-10-CM | POA: Diagnosis not present

## 2017-12-15 DIAGNOSIS — J449 Chronic obstructive pulmonary disease, unspecified: Secondary | ICD-10-CM | POA: Insufficient documentation

## 2017-12-15 DIAGNOSIS — G2581 Restless legs syndrome: Secondary | ICD-10-CM | POA: Diagnosis not present

## 2017-12-15 DIAGNOSIS — N189 Chronic kidney disease, unspecified: Secondary | ICD-10-CM | POA: Insufficient documentation

## 2017-12-15 DIAGNOSIS — M549 Dorsalgia, unspecified: Secondary | ICD-10-CM | POA: Insufficient documentation

## 2017-12-15 DIAGNOSIS — Z9359 Other cystostomy status: Secondary | ICD-10-CM

## 2017-12-15 DIAGNOSIS — R338 Other retention of urine: Secondary | ICD-10-CM | POA: Diagnosis not present

## 2017-12-15 DIAGNOSIS — Z87442 Personal history of urinary calculi: Secondary | ICD-10-CM | POA: Insufficient documentation

## 2017-12-15 LAB — BASIC METABOLIC PANEL
Anion gap: 10 (ref 5–15)
BUN: 38 mg/dL — AB (ref 8–23)
CHLORIDE: 103 mmol/L (ref 98–111)
CO2: 24 mmol/L (ref 22–32)
CREATININE: 2.02 mg/dL — AB (ref 0.61–1.24)
Calcium: 8.4 mg/dL — ABNORMAL LOW (ref 8.9–10.3)
GFR calc Af Amer: 34 mL/min — ABNORMAL LOW (ref >60)
GFR calc non Af Amer: 30 mL/min — ABNORMAL LOW (ref >60)
GLUCOSE: 103 mg/dL — AB (ref 70–99)
Potassium: 4.3 mmol/L (ref 3.5–5.1)
Sodium: 137 mmol/L (ref 135–145)

## 2017-12-15 LAB — CBC WITH DIFFERENTIAL/PLATELET
Basophils Absolute: 0 10*3/uL (ref 0.0–0.1)
Basophils Relative: 0 %
EOS ABS: 0.2 10*3/uL (ref 0.0–0.7)
EOS PCT: 2 %
HCT: 35.7 % — ABNORMAL LOW (ref 39.0–52.0)
HEMOGLOBIN: 10.8 g/dL — AB (ref 13.0–17.0)
LYMPHS ABS: 0.4 10*3/uL — AB (ref 0.7–4.0)
Lymphocytes Relative: 6 %
MCH: 28.3 pg (ref 26.0–34.0)
MCHC: 30.3 g/dL (ref 30.0–36.0)
MCV: 93.7 fL (ref 78.0–100.0)
MONO ABS: 0.8 10*3/uL (ref 0.1–1.0)
MONOS PCT: 11 %
Neutro Abs: 6.3 10*3/uL (ref 1.7–7.7)
Neutrophils Relative %: 81 %
PLATELETS: 284 10*3/uL (ref 150–400)
RBC: 3.81 MIL/uL — ABNORMAL LOW (ref 4.22–5.81)
RDW: 15.5 % (ref 11.5–15.5)
WBC: 7.8 10*3/uL (ref 4.0–10.5)

## 2017-12-15 LAB — PROTIME-INR
INR: 1.04
Prothrombin Time: 13.5 seconds (ref 11.4–15.2)

## 2017-12-15 MED ORDER — LIDOCAINE HCL (PF) 1 % IJ SOLN
INTRAMUSCULAR | Status: AC | PRN
  Administered 2017-12-15: 10 mL

## 2017-12-15 MED ORDER — SODIUM CHLORIDE 0.9 % IV SOLN
INTRAVENOUS | Status: AC
  Filled 2017-12-15: qty 1000

## 2017-12-15 MED ORDER — FENTANYL CITRATE (PF) 100 MCG/2ML IJ SOLN
INTRAMUSCULAR | Status: AC | PRN
  Administered 2017-12-15: 25 ug via INTRAVENOUS

## 2017-12-15 MED ORDER — MIDAZOLAM HCL 2 MG/2ML IJ SOLN
INTRAMUSCULAR | Status: AC | PRN
  Administered 2017-12-15: 0.5 mg via INTRAVENOUS

## 2017-12-15 MED ORDER — MIDAZOLAM HCL 2 MG/2ML IJ SOLN
INTRAMUSCULAR | Status: AC
  Filled 2017-12-15: qty 4

## 2017-12-15 MED ORDER — CIPROFLOXACIN IN D5W 400 MG/200ML IV SOLN
400.0000 mg | INTRAVENOUS | Status: AC
  Administered 2017-12-15: 400 mg via INTRAVENOUS
  Filled 2017-12-15: qty 200

## 2017-12-15 MED ORDER — SODIUM CHLORIDE 0.9 % IV SOLN
INTRAVENOUS | Status: DC
  Administered 2017-12-15: 10:00:00 via INTRAVENOUS

## 2017-12-15 MED ORDER — FENTANYL CITRATE (PF) 100 MCG/2ML IJ SOLN
INTRAMUSCULAR | Status: AC
  Filled 2017-12-15: qty 2

## 2017-12-15 NOTE — Procedures (Signed)
Interventional Radiology Procedure Note  Procedure: CT guided placement of suprapubic catheter.  91G  Complications: None  Estimated Blood Loss: None  Recommendations: - Routine foley care.  - Return to IR in 6 weeks for drain upsize.   Signed,  Criselda Peaches, MD

## 2017-12-15 NOTE — Discharge Instructions (Signed)
Suprapubic Catheter Home Guide °A suprapubic catheter is a rubber tube used to drain urine from the bladder into a collection bag. The catheter is inserted into the bladder through a small opening in the in the lower abdomen, near the center of the body, above the pubic bone (suprapubic area). There is a tiny balloon filled with germ-free (sterile) water on the end of the catheter that is in the bladder. The balloon helps to keep the catheter in place. °Your suprapubic catheter may need to be replaced every 4-6 weeks, or as often as recommended by your health care provider. The collection bag must be emptied every day and cleaned every 2-3 days. The collection bag can be put beside your bed at night and attached to your leg during the day. You may have a large collection bag to use at night and a smaller one to use during the day. °What are the risks? °· Urine flow can become blocked. This can happen if the catheter is not working correctly, or if you have a blood clot in your bladder or in the catheter. °· Tissue near the catheter may can become irritated and bleed. °· Bacteria may get into your bladder and cause a urinary tract infection. °How do I change the catheter? °Supplies needed °· Two pairs of sterile gloves. °· Catheter. °· Two syringes. °· Sterile water. °· Sterile cleaning solution. °· Lubricant. °· Collection bags. °Changing the catheter °To replace your catheter, take the following steps: °1. Drink plenty of fluids during the hours before you plan to change the catheter. °2. Wash your hands with soap and water. If soap and water are not available, use hand sanitizer. °3. Lie on your back and put on sterile gloves. °4. Clean the skin around the catheter opening using the sterile cleaning solution. °5. Remove the water from the balloon using a syringe. °6. Slowly remove the catheter. °? Do not pull on the catheter if it seems stuck. °? Call your health care provider immediately if you have difficulty  removing the catheter. °7. Take off the used gloves, and put on a new pair. °8. Put lubricant on the end of the new catheter that will go into your bladder. °9. Gently slide the catheter through the opening in your abdomen and into your bladder. °10. Wait for some urine to start flowing through the catheter. When urine starts to flow through the catheter, use a new syringe to fill the balloon with sterile water. °11. Attach the collection bag to the end of the catheter. Make sure the connection is tight. °12. Remove the gloves and wash your hands with soap and water. ° °How do I care for my skin around the catheter? °Use a clean washcloth and soapy water to clean the skin around your catheter every day. Pat the area dry with a clean towel. °· Do not pull on the catheter. °· Do not use ointment or lotion on this area unless told by your health care provider. °· Check your skin around the catheter every day for signs of infection. Check for: °? Redness, swelling, or pain. °? Fluid or blood. °? Warmth. °? Pus or a bad smell. ° °How do I clean and empty the collection bag? °Clean the collection bag every 2-3 days, or as often as told by your health care provider. To do this, take the following steps: °· Wash your hands with soap and water. If soap and water are not available, use hand sanitizer. °· Disconnect the bag   from the catheter and immediately attach a new bag to the catheter. °· Empty the used bag completely. °· Clean the used bag using one of the following methods: °? Rinse the used bag with warm water and soap. °? Fill the bag with water and add 1 tsp of vinegar. Let it sit for about 30 minutes, then empty the bag. °· Let the bag dry completely, and put it in a clean plastic bag before storing it. ° °Empty the large collection bag every 8 hours. Empty the small collection bag when it is about ? full. To empty your large or small collection bag, take the following steps: °· Always keep the bag below the level  of the catheter. This keeps urine from flowing backwards into the catheter. °· Hold the bag over the toilet or another container. Turn the valve (spigot) at the bottom of the bag to empty the urine. °? Do not touch the opening of the spigot. °? Do not let the opening touch the toilet or container. °· Close the spigot tightly when the bag is empty. ° °What are some general tips? °· Always wash your hands before and after caring for your catheter and collection bag. Use a mild, fragrance-free soap. If soap and water are not available, use hand sanitizer. °· Clean the catheter with soap and water as often as told by your health care provider. °· Always make sure there are no twists or curls (kinks) in the catheter tube. °· Always make sure there are no leaks in the catheter or collection bag. °· Drink enough fluid to keep your urine clear or pale yellow. °· Do not take baths, swim, or use a hot tub. °When should I seek medical care? °Seek medical care if: °· You leak urine. °· You have redness, swelling, or pain around your catheter opening. °· You have fluid or blood coming from your catheter opening. °· Your catheter opening feels warm to the touch. °· You have pus or a bad smell coming from your catheter opening. °· You have a fever or chills. °· Your urine flow slows down. °· Your urine becomes cloudy or smelly. ° °When should I seek immediate medical care? °Seek immediate medical care if your catheter comes out, or if you have: °· Nausea. °· Back pain. °· Difficulty changing your catheter. °· Blood in your urine. °· No urine flow for 1 hour. ° °This information is not intended to replace advice given to you by your health care provider. Make sure you discuss any questions you have with your health care provider. °Document Released: 11/17/2010 Document Revised: 10/29/2015 Document Reviewed: 11/12/2014 °Elsevier Interactive Patient Education © 2018 [employer] °Moderate Conscious Sedation, Adult, Care After °These  instructions provide you with information about caring for yourself after your procedure. Your health care provider may also give you more specific instructions. Your treatment has been planned according to current medical practices, but problems sometimes occur. Call your health care provider if you have any problems or questions after your procedure. °What can I expect after the procedure? °After your procedure, it is common: °· To feel sleepy for several hours. °· To feel clumsy and have poor balance for several hours. °· To have poor judgment for several hours. °· To vomit if you eat too soon. ° °Follow these instructions at home: °For at least 24 hours after the procedure: ° °· Do not: °? Participate in activities where you could fall or become injured. °? Drive. °? Use heavy   machinery. °? Drink alcohol. °? Take sleeping pills or medicines that cause drowsiness. °? Make important decisions or sign legal documents. °? Take care of children on your own. °· Rest. °Eating and drinking °· Follow the diet recommended by your health care provider. °· If you vomit: °? Drink water, juice, or soup when you can drink without vomiting. °? Make sure you have little or no nausea before eating solid foods. °General instructions °· Have a responsible adult stay with you until you are awake and alert. °· Take over-the-counter and prescription medicines only as told by your health care provider. °· If you smoke, do not smoke without supervision. °· Keep all follow-up visits as told by your health care provider. This is important. °Contact a health care provider if: °· You keep feeling nauseous or you keep vomiting. °· You feel light-headed. °· You develop a rash. °· You have a fever. °Get help right away if: °· You have trouble breathing. °This information is not intended to replace advice given to you by your health care provider. Make sure you discuss any questions you have with your health care provider. °Document Released:  12/20/2012 Document Revised: 08/04/2015 Document Reviewed: 06/21/2015 °Elsevier Interactive Patient Education © 2018 Elsevier Inc. ° °

## 2017-12-15 NOTE — H&P (Signed)
Chief Complaint: Patient was seen in consultation today for BPH with obstruction.  Referring Physician(s): McKenzie,Patrick L  Supervising Physician: Jacqulynn Cadet  Patient Status: [facility] - Out-pt  History of Present Illness: Anthony [name] is a 77 y.o. male with a past medical history of pulmonary nodules, COPD, asthma, GERD, CKD, nephrolithiasis, acute pyelonephritis, BPH, restless leg syndrome, chronic hip and back pain, dementia, and depression. He has a history of BPH with obstruction, and has began to experience lower urinary tract symptoms. His BPH is managed by his urologist, Dr. Alyson Ingles.  IR requested by Dr. Alyson Ingles for possible image-guided percutaneous suprapubic catheter placement. Patient awake and alert sitting in bed. Accompanied by home health nurse. Complains of foot pain. No sign of injury, erythema, edema to feet. Home health nurse states they cut his toenails yesterday, and that may be the cause of pain. Denies fever, chills, chest pain, dyspnea, abdominal pain, or headache.   Past Medical History:  Diagnosis Date  . Acute pyelonephritis 12/23/2014  . Asthma   . BPH (benign prostatic hyperplasia)   . Chronic back pain   . Chronic hip pain   . CKD (chronic kidney disease)   . COPD (chronic obstructive pulmonary disease) ([place])   . Dementia   . Depression   . GERD (gastroesophageal reflux disease)   . Kidney stone   . Neuropathy   . Pulmonary nodules 12/25/2014  . Restless leg syndrome     Past Surgical History:  Procedure Laterality Date  . ABDOMINAL SURGERY     heria  . ESOPHAGOGASTRODUODENOSCOPY N/A 07/06/2017   Procedure: ESOPHAGOGASTRODUODENOSCOPY (EGD);  Surgeon: Rogene Houston, MD;  Location: [facility];  Service: Endoscopy;  Laterality: N/A;  . EYE SURGERY      Allergies: Bee venom; Penicillins; and Strawberry extract  Medications: Prior to Admission medications   Medication Sig Start Date End Date Taking? Authorizing Provider    acetaminophen (TYLENOL) 500 MG tablet Take 500 mg by mouth 3 (three) times daily as needed for mild pain.     [provider]  albuterol (PROVENTIL HFA;VENTOLIN HFA) 108 (90 BASE) MCG/ACT inhaler Inhale 2 puffs into the lungs every 4 (four) hours as needed for wheezing or shortness of breath.    [provider]  aspirin EC 81 MG tablet Take 81 mg by mouth daily.    [provider]  baclofen (LIORESAL) 10 MG tablet Take 10 mg by mouth 2 (two) times daily.    [provider]  buPROPion (WELLBUTRIN SR) 150 MG 12 hr tablet Take 150 mg by mouth 2 (two) times daily.    [provider]  Calcium Citrate-Vitamin D (CALCIUM CITRATE+D3 PETITES) 200-250 MG-UNIT TABS Take 1 tablet by mouth daily.    [provider]  fluticasone (FLONASE) 50 MCG/ACT nasal spray Place 1 spray 2 (two) times daily into both nostrils.     [provider]  Fluticasone-Salmeterol (ADVAIR) 250-50 MCG/DOSE AEPB Inhale 1 puff 2 (two) times daily into the lungs.    [provider]  gabapentin (NEURONTIN) 400 MG capsule Take 400 mg by mouth 3 (three) times daily.    [provider]  ipratropium (ATROVENT) 0.02 % nebulizer solution Take 2.5 mLs (0.5 mg total) by nebulization every 6 (six) hours as needed for wheezing or shortness of breath. Patient taking differently: Take 0.5 mg by nebulization 4 (four) times daily.  07/07/17   Johnson, Clanford L, MD  memantine (NAMENDA) 5 MG tablet Take 1 tablet (5 mg total) by mouth  2 (two) times daily. 10/14/17   Kathie Dike, MD  nitrofurantoin (MACRODANTIN) 100 MG capsule Take 100 mg by mouth every 6 (six) hours. Starting 10/17/2017 x 5 days.    [provider]  nystatin (MYCOSTATIN/NYSTOP) powder Apply topically 2 (two) times daily. 11/11/17   Manuella Ghazi, Pratik D, DO  pantoprazole (PROTONIX) 40 MG tablet Take 1 tablet (40 mg total) by mouth 2 (two) times daily before a meal. 07/07/17   Johnson, Clanford L, MD   polyethylene glycol (MIRALAX) packet Take 17 g by mouth daily. 10/14/17   Kathie Dike, MD  pramipexole (MIRAPEX) 0.125 MG tablet Take 0.125 mg by mouth at bedtime.    [provider]  sennosides-docusate sodium (SENOKOT-S) 8.6-50 MG tablet Take 1 tablet by mouth daily as needed for constipation.    [provider]  sertraline (ZOLOFT) 50 MG tablet Take 50 mg by mouth daily.    [provider]  tamsulosin (FLOMAX) 0.4 MG CAPS capsule Take 0.4 mg by mouth daily.     [provider]  traMADol (ULTRAM) 50 MG tablet Take 1 tablet (50 mg total) by mouth every 6 (six) hours as needed for severe pain. Patient taking differently: Take 50 mg by mouth 2 (two) times daily.  07/07/17   Johnson, Clanford L, MD  traZODone (DESYREL) 100 MG tablet Take 200 mg by mouth at bedtime.    [provider]  triamcinolone ointment (KENALOG) 0.5 % Apply 1 application topically 2 (two) times daily. Apply to affected areas of forearms twice daily.    [provider]  vitamin B-12 (CYANOCOBALAMIN) 1000 MCG tablet Take 1,000 mcg by mouth daily.    [provider]  Vitamin D, Ergocalciferol, (DRISDOL) 50000 units CAPS capsule Take 50,000 Units by mouth every Friday.     [provider]     Family History  Family history unknown: Yes    Social History   Socioeconomic History  . Marital status: Widowed    Spouse name: Not on file  . Number of children: Not on file  . Years of education: Not on file  . Highest education level: Not on file  Occupational History  . Not on file  Social Needs  . Financial resource strain: Not on file  . Food insecurity:    Worry: Not on file    Inability: Not on file  . Transportation needs:    Medical: Not on file    Non-medical: Not on file  Tobacco Use  . Smoking status: Former Smoker    Packs/day: 0.50    Types: Cigarettes    Last attempt to quit: 06/09/2017    Years since quitting: 0.5  . Smokeless  tobacco: Never Used  Substance and Sexual Activity  . Alcohol use: No  . Drug use: No  . Sexual activity: Not Currently  Lifestyle  . Physical activity:    Days per week: Not on file    Minutes per session: Not on file  . Stress: Not on file  Relationships  . Social connections:    Talks on phone: Not on file    Gets together: Not on file    Attends religious service: Not on file    Active member of club or organization: Not on file    Attends meetings of clubs or organizations: Not on file    Relationship status: Not on file  Other Topics Concern  . Not on file  Social History Narrative  . Not on file  Review of Systems: A 12 point ROS discussed and pertinent positives are indicated in the HPI above.  All other systems are negative.  Review of Systems  Constitutional: Negative for chills and fever.  Respiratory: Negative for shortness of breath and wheezing.   Cardiovascular: Negative for chest pain and palpitations.  Gastrointestinal: Negative for abdominal pain.  Neurological: Negative for headaches.  Psychiatric/Behavioral: Negative for behavioral problems and confusion.    Vital Signs: BP 124/72 (BP Location: Right Arm)   Pulse 81   Temp 98.1 F (36.7 C) (Oral)   Resp 18   SpO2 92%   Physical Exam  Constitutional: He is oriented to person, place, and time. He appears well-developed and well-nourished. No distress.  Cardiovascular: Normal rate, regular rhythm and normal heart sounds.  No murmur heard. Pulmonary/Chest: Effort normal and breath sounds normal. No respiratory distress. He has no wheezes.  Neurological: He is alert and oriented to person, place, and time.  Skin: Skin is warm and dry.  Psychiatric: He has a normal mood and affect. His behavior is normal. Judgment and thought content normal.  Nursing note and vitals reviewed.    MD Evaluation Airway: WNL Heart: WNL Abdomen: WNL Chest/ Lungs: WNL ASA  Classification: 3 Mallampati/Airway  Score: One   Imaging: No results found.  Labs:  CBC: Recent Labs    10/23/17 1600 11/07/17 0010 11/09/17 0419 11/11/17 0506  WBC 9.9 13.7* 6.0 6.0  HGB 10.5* 10.2* 8.3* 8.6*  HCT 34.2* 32.4* 27.0* 28.2*  PLT 237 292 242 232    COAGS: Recent Labs    07/06/17 0049  INR 1.06  APTT 63*    BMP: Recent Labs    10/23/17 1600 11/07/17 0010 11/09/17 0419 11/11/17 0506  NA 141 137 138 139  K 4.5 4.2 4.4 3.8  CL 111 106 111 111  CO2 22 23 21* 21*  GLUCOSE 123* 115* 93 75  BUN 48* 41* 34* 22  CALCIUM 8.5* 8.5* 7.9* 8.2*  CREATININE 2.55* 2.28* 1.72* 1.67*  GFRNONAA 22* 26* 36* 37*  GFRAA 26* 30* 42* 43*    LIVER FUNCTION TESTS: Recent Labs    06/19/17 0021 07/06/17 0049 10/10/17 1416 10/14/17 0420 11/07/17 0010  BILITOT 0.7 0.6 0.5  --  0.4  AST 16 13* 13*  --  15  ALT 10* 10* 8  --  12  ALKPHOS 58 91 65  --  75  PROT 6.6 6.8 7.7  --  8.0  ALBUMIN 3.2* 3.2* 3.3* 2.7* 3.0*    TUMOR MARKERS: No results for input(s): AFPTM, CEA, CA199, CHROMGRNA in the last 8760 hours.  Assessment and Plan:  BPH with obstruction. Plan for image-guided percutaneous suprapubic catheter placement today with Dr. Laurence Ferrari. Patient is NPO. Denies fever. He does not take blood thinners. INR pending.  Risks and benefits discussed with the patient including bleeding, infection, damage to adjacent structures, bowel perforation/fistula connection, and sepsis. All of the patient's questions were answered, patient is agreeable to proceed. Consent signed and in chart.   Thank you for this interesting consult.  I greatly enjoyed meeting Anthony [name] and look forward to participating in their care.  A copy of this report was sent to the requesting provider on this date.  Electronically Signed: Earley Abide, PA-C 12/15/2017, 9:33 AM   I spent a total of 15 Minutes in face to face in clinical consultation, greater than 50% of which was counseling/coordinating care for BPH with  obstruction.

## 2017-12-20 DIAGNOSIS — M6281 Muscle weakness (generalized): Secondary | ICD-10-CM | POA: Diagnosis not present

## 2017-12-20 DIAGNOSIS — M7521 Bicipital tendinitis, right shoulder: Secondary | ICD-10-CM | POA: Diagnosis not present

## 2017-12-20 DIAGNOSIS — R41841 Cognitive communication deficit: Secondary | ICD-10-CM | POA: Diagnosis not present

## 2017-12-20 DIAGNOSIS — R1312 Dysphagia, oropharyngeal phase: Secondary | ICD-10-CM | POA: Diagnosis not present

## 2017-12-21 DIAGNOSIS — M7521 Bicipital tendinitis, right shoulder: Secondary | ICD-10-CM | POA: Diagnosis not present

## 2017-12-21 DIAGNOSIS — M6281 Muscle weakness (generalized): Secondary | ICD-10-CM | POA: Diagnosis not present

## 2017-12-21 DIAGNOSIS — R41841 Cognitive communication deficit: Secondary | ICD-10-CM | POA: Diagnosis not present

## 2017-12-21 DIAGNOSIS — R1312 Dysphagia, oropharyngeal phase: Secondary | ICD-10-CM | POA: Diagnosis not present

## 2017-12-22 DIAGNOSIS — M6281 Muscle weakness (generalized): Secondary | ICD-10-CM | POA: Diagnosis not present

## 2017-12-22 DIAGNOSIS — M7521 Bicipital tendinitis, right shoulder: Secondary | ICD-10-CM | POA: Diagnosis not present

## 2017-12-22 DIAGNOSIS — R41841 Cognitive communication deficit: Secondary | ICD-10-CM | POA: Diagnosis not present

## 2017-12-22 DIAGNOSIS — R1312 Dysphagia, oropharyngeal phase: Secondary | ICD-10-CM | POA: Diagnosis not present

## 2017-12-23 DIAGNOSIS — Z23 Encounter for immunization: Secondary | ICD-10-CM | POA: Diagnosis not present

## 2017-12-23 DIAGNOSIS — M6281 Muscle weakness (generalized): Secondary | ICD-10-CM | POA: Diagnosis not present

## 2017-12-23 DIAGNOSIS — M7521 Bicipital tendinitis, right shoulder: Secondary | ICD-10-CM | POA: Diagnosis not present

## 2017-12-23 DIAGNOSIS — R1312 Dysphagia, oropharyngeal phase: Secondary | ICD-10-CM | POA: Diagnosis not present

## 2017-12-23 DIAGNOSIS — R41841 Cognitive communication deficit: Secondary | ICD-10-CM | POA: Diagnosis not present

## 2017-12-26 DIAGNOSIS — R41841 Cognitive communication deficit: Secondary | ICD-10-CM | POA: Diagnosis not present

## 2017-12-26 DIAGNOSIS — R1312 Dysphagia, oropharyngeal phase: Secondary | ICD-10-CM | POA: Diagnosis not present

## 2017-12-26 DIAGNOSIS — M6281 Muscle weakness (generalized): Secondary | ICD-10-CM | POA: Diagnosis not present

## 2017-12-26 DIAGNOSIS — J029 Acute pharyngitis, unspecified: Secondary | ICD-10-CM | POA: Diagnosis not present

## 2017-12-26 DIAGNOSIS — M7521 Bicipital tendinitis, right shoulder: Secondary | ICD-10-CM | POA: Diagnosis not present

## 2017-12-27 DIAGNOSIS — M7521 Bicipital tendinitis, right shoulder: Secondary | ICD-10-CM | POA: Diagnosis not present

## 2017-12-27 DIAGNOSIS — M6281 Muscle weakness (generalized): Secondary | ICD-10-CM | POA: Diagnosis not present

## 2017-12-27 DIAGNOSIS — R41841 Cognitive communication deficit: Secondary | ICD-10-CM | POA: Diagnosis not present

## 2017-12-27 DIAGNOSIS — R1312 Dysphagia, oropharyngeal phase: Secondary | ICD-10-CM | POA: Diagnosis not present

## 2017-12-28 DIAGNOSIS — M7521 Bicipital tendinitis, right shoulder: Secondary | ICD-10-CM | POA: Diagnosis not present

## 2017-12-28 DIAGNOSIS — R1312 Dysphagia, oropharyngeal phase: Secondary | ICD-10-CM | POA: Diagnosis not present

## 2017-12-28 DIAGNOSIS — R41841 Cognitive communication deficit: Secondary | ICD-10-CM | POA: Diagnosis not present

## 2017-12-28 DIAGNOSIS — M6281 Muscle weakness (generalized): Secondary | ICD-10-CM | POA: Diagnosis not present

## 2017-12-29 DIAGNOSIS — M6281 Muscle weakness (generalized): Secondary | ICD-10-CM | POA: Diagnosis not present

## 2017-12-29 DIAGNOSIS — M7521 Bicipital tendinitis, right shoulder: Secondary | ICD-10-CM | POA: Diagnosis not present

## 2017-12-29 DIAGNOSIS — R41841 Cognitive communication deficit: Secondary | ICD-10-CM | POA: Diagnosis not present

## 2017-12-29 DIAGNOSIS — R1312 Dysphagia, oropharyngeal phase: Secondary | ICD-10-CM | POA: Diagnosis not present

## 2017-12-30 DIAGNOSIS — R41841 Cognitive communication deficit: Secondary | ICD-10-CM | POA: Diagnosis not present

## 2017-12-30 DIAGNOSIS — M7521 Bicipital tendinitis, right shoulder: Secondary | ICD-10-CM | POA: Diagnosis not present

## 2017-12-30 DIAGNOSIS — M6281 Muscle weakness (generalized): Secondary | ICD-10-CM | POA: Diagnosis not present

## 2017-12-30 DIAGNOSIS — R1312 Dysphagia, oropharyngeal phase: Secondary | ICD-10-CM | POA: Diagnosis not present

## 2018-01-01 DIAGNOSIS — M6281 Muscle weakness (generalized): Secondary | ICD-10-CM | POA: Diagnosis not present

## 2018-01-01 DIAGNOSIS — R1312 Dysphagia, oropharyngeal phase: Secondary | ICD-10-CM | POA: Diagnosis not present

## 2018-01-01 DIAGNOSIS — R41841 Cognitive communication deficit: Secondary | ICD-10-CM | POA: Diagnosis not present

## 2018-01-01 DIAGNOSIS — M7521 Bicipital tendinitis, right shoulder: Secondary | ICD-10-CM | POA: Diagnosis not present

## 2018-01-02 DIAGNOSIS — M7521 Bicipital tendinitis, right shoulder: Secondary | ICD-10-CM | POA: Diagnosis not present

## 2018-01-02 DIAGNOSIS — M6281 Muscle weakness (generalized): Secondary | ICD-10-CM | POA: Diagnosis not present

## 2018-01-02 DIAGNOSIS — R41841 Cognitive communication deficit: Secondary | ICD-10-CM | POA: Diagnosis not present

## 2018-01-02 DIAGNOSIS — F4312 Post-traumatic stress disorder, chronic: Secondary | ICD-10-CM | POA: Diagnosis not present

## 2018-01-02 DIAGNOSIS — F331 Major depressive disorder, recurrent, moderate: Secondary | ICD-10-CM | POA: Diagnosis not present

## 2018-01-02 DIAGNOSIS — F0390 Unspecified dementia without behavioral disturbance: Secondary | ICD-10-CM | POA: Diagnosis not present

## 2018-01-02 DIAGNOSIS — R1312 Dysphagia, oropharyngeal phase: Secondary | ICD-10-CM | POA: Diagnosis not present

## 2018-01-02 DIAGNOSIS — N183 Chronic kidney disease, stage 3 (moderate): Secondary | ICD-10-CM | POA: Diagnosis not present

## 2018-01-03 DIAGNOSIS — R41841 Cognitive communication deficit: Secondary | ICD-10-CM | POA: Diagnosis not present

## 2018-01-03 DIAGNOSIS — M7521 Bicipital tendinitis, right shoulder: Secondary | ICD-10-CM | POA: Diagnosis not present

## 2018-01-03 DIAGNOSIS — R1312 Dysphagia, oropharyngeal phase: Secondary | ICD-10-CM | POA: Diagnosis not present

## 2018-01-03 DIAGNOSIS — M6281 Muscle weakness (generalized): Secondary | ICD-10-CM | POA: Diagnosis not present

## 2018-01-04 DIAGNOSIS — M6281 Muscle weakness (generalized): Secondary | ICD-10-CM | POA: Diagnosis not present

## 2018-01-04 DIAGNOSIS — R1312 Dysphagia, oropharyngeal phase: Secondary | ICD-10-CM | POA: Diagnosis not present

## 2018-01-04 DIAGNOSIS — R41841 Cognitive communication deficit: Secondary | ICD-10-CM | POA: Diagnosis not present

## 2018-01-04 DIAGNOSIS — M7521 Bicipital tendinitis, right shoulder: Secondary | ICD-10-CM | POA: Diagnosis not present

## 2018-01-05 DIAGNOSIS — R41841 Cognitive communication deficit: Secondary | ICD-10-CM | POA: Diagnosis not present

## 2018-01-05 DIAGNOSIS — M7521 Bicipital tendinitis, right shoulder: Secondary | ICD-10-CM | POA: Diagnosis not present

## 2018-01-05 DIAGNOSIS — R1312 Dysphagia, oropharyngeal phase: Secondary | ICD-10-CM | POA: Diagnosis not present

## 2018-01-05 DIAGNOSIS — M6281 Muscle weakness (generalized): Secondary | ICD-10-CM | POA: Diagnosis not present

## 2018-01-06 DIAGNOSIS — M6281 Muscle weakness (generalized): Secondary | ICD-10-CM | POA: Diagnosis not present

## 2018-01-06 DIAGNOSIS — R1312 Dysphagia, oropharyngeal phase: Secondary | ICD-10-CM | POA: Diagnosis not present

## 2018-01-06 DIAGNOSIS — M7521 Bicipital tendinitis, right shoulder: Secondary | ICD-10-CM | POA: Diagnosis not present

## 2018-01-06 DIAGNOSIS — R41841 Cognitive communication deficit: Secondary | ICD-10-CM | POA: Diagnosis not present

## 2018-01-09 DIAGNOSIS — M6281 Muscle weakness (generalized): Secondary | ICD-10-CM | POA: Diagnosis not present

## 2018-01-09 DIAGNOSIS — L0291 Cutaneous abscess, unspecified: Secondary | ICD-10-CM | POA: Diagnosis not present

## 2018-01-09 DIAGNOSIS — M7521 Bicipital tendinitis, right shoulder: Secondary | ICD-10-CM | POA: Diagnosis not present

## 2018-01-09 DIAGNOSIS — R1312 Dysphagia, oropharyngeal phase: Secondary | ICD-10-CM | POA: Diagnosis not present

## 2018-01-09 DIAGNOSIS — R41841 Cognitive communication deficit: Secondary | ICD-10-CM | POA: Diagnosis not present

## 2018-01-09 DIAGNOSIS — R0902 Hypoxemia: Secondary | ICD-10-CM | POA: Diagnosis not present

## 2018-01-09 DIAGNOSIS — R0989 Other specified symptoms and signs involving the circulatory and respiratory systems: Secondary | ICD-10-CM | POA: Diagnosis not present

## 2018-01-10 DIAGNOSIS — M7521 Bicipital tendinitis, right shoulder: Secondary | ICD-10-CM | POA: Diagnosis not present

## 2018-01-10 DIAGNOSIS — R1312 Dysphagia, oropharyngeal phase: Secondary | ICD-10-CM | POA: Diagnosis not present

## 2018-01-10 DIAGNOSIS — R41841 Cognitive communication deficit: Secondary | ICD-10-CM | POA: Diagnosis not present

## 2018-01-10 DIAGNOSIS — M6281 Muscle weakness (generalized): Secondary | ICD-10-CM | POA: Diagnosis not present

## 2018-01-11 DIAGNOSIS — M7521 Bicipital tendinitis, right shoulder: Secondary | ICD-10-CM | POA: Diagnosis not present

## 2018-01-11 DIAGNOSIS — L0291 Cutaneous abscess, unspecified: Secondary | ICD-10-CM | POA: Diagnosis not present

## 2018-01-11 DIAGNOSIS — N183 Chronic kidney disease, stage 3 (moderate): Secondary | ICD-10-CM | POA: Diagnosis not present

## 2018-01-11 DIAGNOSIS — R41841 Cognitive communication deficit: Secondary | ICD-10-CM | POA: Diagnosis not present

## 2018-01-11 DIAGNOSIS — J449 Chronic obstructive pulmonary disease, unspecified: Secondary | ICD-10-CM | POA: Diagnosis not present

## 2018-01-11 DIAGNOSIS — M6281 Muscle weakness (generalized): Secondary | ICD-10-CM | POA: Diagnosis not present

## 2018-01-11 DIAGNOSIS — R1312 Dysphagia, oropharyngeal phase: Secondary | ICD-10-CM | POA: Diagnosis not present

## 2018-01-11 DIAGNOSIS — F0390 Unspecified dementia without behavioral disturbance: Secondary | ICD-10-CM | POA: Diagnosis not present

## 2018-01-12 DIAGNOSIS — R41841 Cognitive communication deficit: Secondary | ICD-10-CM | POA: Diagnosis not present

## 2018-01-12 DIAGNOSIS — R1312 Dysphagia, oropharyngeal phase: Secondary | ICD-10-CM | POA: Diagnosis not present

## 2018-01-12 DIAGNOSIS — M6281 Muscle weakness (generalized): Secondary | ICD-10-CM | POA: Diagnosis not present

## 2018-01-12 DIAGNOSIS — M7521 Bicipital tendinitis, right shoulder: Secondary | ICD-10-CM | POA: Diagnosis not present

## 2018-01-13 DIAGNOSIS — R1312 Dysphagia, oropharyngeal phase: Secondary | ICD-10-CM | POA: Diagnosis not present

## 2018-01-13 DIAGNOSIS — M6281 Muscle weakness (generalized): Secondary | ICD-10-CM | POA: Diagnosis not present

## 2018-01-13 DIAGNOSIS — R41841 Cognitive communication deficit: Secondary | ICD-10-CM | POA: Diagnosis not present

## 2018-01-13 DIAGNOSIS — M7521 Bicipital tendinitis, right shoulder: Secondary | ICD-10-CM | POA: Diagnosis not present

## 2018-01-14 DIAGNOSIS — M7521 Bicipital tendinitis, right shoulder: Secondary | ICD-10-CM | POA: Diagnosis not present

## 2018-01-14 DIAGNOSIS — M6281 Muscle weakness (generalized): Secondary | ICD-10-CM | POA: Diagnosis not present

## 2018-01-14 DIAGNOSIS — R1312 Dysphagia, oropharyngeal phase: Secondary | ICD-10-CM | POA: Diagnosis not present

## 2018-01-14 DIAGNOSIS — R41841 Cognitive communication deficit: Secondary | ICD-10-CM | POA: Diagnosis not present

## 2018-01-16 DIAGNOSIS — R1312 Dysphagia, oropharyngeal phase: Secondary | ICD-10-CM | POA: Diagnosis not present

## 2018-01-16 DIAGNOSIS — R41841 Cognitive communication deficit: Secondary | ICD-10-CM | POA: Diagnosis not present

## 2018-01-16 DIAGNOSIS — M7521 Bicipital tendinitis, right shoulder: Secondary | ICD-10-CM | POA: Diagnosis not present

## 2018-01-16 DIAGNOSIS — R0902 Hypoxemia: Secondary | ICD-10-CM | POA: Diagnosis not present

## 2018-01-16 DIAGNOSIS — M6281 Muscle weakness (generalized): Secondary | ICD-10-CM | POA: Diagnosis not present

## 2018-01-16 DIAGNOSIS — J984 Other disorders of lung: Secondary | ICD-10-CM | POA: Diagnosis not present

## 2018-01-17 DIAGNOSIS — M6281 Muscle weakness (generalized): Secondary | ICD-10-CM | POA: Diagnosis not present

## 2018-01-17 DIAGNOSIS — M7521 Bicipital tendinitis, right shoulder: Secondary | ICD-10-CM | POA: Diagnosis not present

## 2018-01-17 DIAGNOSIS — R1312 Dysphagia, oropharyngeal phase: Secondary | ICD-10-CM | POA: Diagnosis not present

## 2018-01-17 DIAGNOSIS — R41841 Cognitive communication deficit: Secondary | ICD-10-CM | POA: Diagnosis not present

## 2018-01-18 ENCOUNTER — Ambulatory Visit (INDEPENDENT_AMBULATORY_CARE_PROVIDER_SITE_OTHER): Payer: Medicare Other | Admitting: Urology

## 2018-01-18 DIAGNOSIS — M6281 Muscle weakness (generalized): Secondary | ICD-10-CM | POA: Diagnosis not present

## 2018-01-18 DIAGNOSIS — R1312 Dysphagia, oropharyngeal phase: Secondary | ICD-10-CM | POA: Diagnosis not present

## 2018-01-18 DIAGNOSIS — N401 Enlarged prostate with lower urinary tract symptoms: Secondary | ICD-10-CM | POA: Diagnosis not present

## 2018-01-18 DIAGNOSIS — R41841 Cognitive communication deficit: Secondary | ICD-10-CM | POA: Diagnosis not present

## 2018-01-18 DIAGNOSIS — M7521 Bicipital tendinitis, right shoulder: Secondary | ICD-10-CM | POA: Diagnosis not present

## 2018-01-19 DIAGNOSIS — R1312 Dysphagia, oropharyngeal phase: Secondary | ICD-10-CM | POA: Diagnosis not present

## 2018-01-19 DIAGNOSIS — R41841 Cognitive communication deficit: Secondary | ICD-10-CM | POA: Diagnosis not present

## 2018-01-19 DIAGNOSIS — M6281 Muscle weakness (generalized): Secondary | ICD-10-CM | POA: Diagnosis not present

## 2018-01-19 DIAGNOSIS — M7521 Bicipital tendinitis, right shoulder: Secondary | ICD-10-CM | POA: Diagnosis not present

## 2018-01-20 DIAGNOSIS — R1312 Dysphagia, oropharyngeal phase: Secondary | ICD-10-CM | POA: Diagnosis not present

## 2018-01-20 DIAGNOSIS — M7521 Bicipital tendinitis, right shoulder: Secondary | ICD-10-CM | POA: Diagnosis not present

## 2018-01-20 DIAGNOSIS — M6281 Muscle weakness (generalized): Secondary | ICD-10-CM | POA: Diagnosis not present

## 2018-01-20 DIAGNOSIS — R41841 Cognitive communication deficit: Secondary | ICD-10-CM | POA: Diagnosis not present

## 2018-01-23 DIAGNOSIS — M7521 Bicipital tendinitis, right shoulder: Secondary | ICD-10-CM | POA: Diagnosis not present

## 2018-01-23 DIAGNOSIS — R1312 Dysphagia, oropharyngeal phase: Secondary | ICD-10-CM | POA: Diagnosis not present

## 2018-01-23 DIAGNOSIS — M6281 Muscle weakness (generalized): Secondary | ICD-10-CM | POA: Diagnosis not present

## 2018-01-23 DIAGNOSIS — R41841 Cognitive communication deficit: Secondary | ICD-10-CM | POA: Diagnosis not present

## 2018-01-24 ENCOUNTER — Other Ambulatory Visit: Payer: Self-pay | Admitting: Radiology

## 2018-01-24 DIAGNOSIS — R1312 Dysphagia, oropharyngeal phase: Secondary | ICD-10-CM | POA: Diagnosis not present

## 2018-01-24 DIAGNOSIS — R41841 Cognitive communication deficit: Secondary | ICD-10-CM | POA: Diagnosis not present

## 2018-01-24 DIAGNOSIS — M6281 Muscle weakness (generalized): Secondary | ICD-10-CM | POA: Diagnosis not present

## 2018-01-24 DIAGNOSIS — M7521 Bicipital tendinitis, right shoulder: Secondary | ICD-10-CM | POA: Diagnosis not present

## 2018-01-25 DIAGNOSIS — M7521 Bicipital tendinitis, right shoulder: Secondary | ICD-10-CM | POA: Diagnosis not present

## 2018-01-25 DIAGNOSIS — R41841 Cognitive communication deficit: Secondary | ICD-10-CM | POA: Diagnosis not present

## 2018-01-25 DIAGNOSIS — M6281 Muscle weakness (generalized): Secondary | ICD-10-CM | POA: Diagnosis not present

## 2018-01-25 DIAGNOSIS — R1312 Dysphagia, oropharyngeal phase: Secondary | ICD-10-CM | POA: Diagnosis not present

## 2018-01-26 ENCOUNTER — Encounter (HOSPITAL_COMMUNITY): Payer: Self-pay

## 2018-01-26 ENCOUNTER — Ambulatory Visit (HOSPITAL_COMMUNITY)
Admission: RE | Admit: 2018-01-26 | Discharge: 2018-01-26 | Disposition: A | Discharge status: Home / Self Care | Payer: Medicare Other | Source: Ambulatory Visit | Attending: Interventional Radiology | Admitting: Interventional Radiology

## 2018-01-26 DIAGNOSIS — F0390 Unspecified dementia without behavioral disturbance: Secondary | ICD-10-CM | POA: Diagnosis not present

## 2018-01-26 DIAGNOSIS — N138 Other obstructive and reflux uropathy: Secondary | ICD-10-CM | POA: Insufficient documentation

## 2018-01-26 DIAGNOSIS — R338 Other retention of urine: Secondary | ICD-10-CM | POA: Diagnosis not present

## 2018-01-26 DIAGNOSIS — Z7982 Long term (current) use of aspirin: Secondary | ICD-10-CM | POA: Diagnosis not present

## 2018-01-26 DIAGNOSIS — Z880 Allergy status to penicillin: Secondary | ICD-10-CM | POA: Diagnosis not present

## 2018-01-26 DIAGNOSIS — N401 Enlarged prostate with lower urinary tract symptoms: Secondary | ICD-10-CM | POA: Diagnosis not present

## 2018-01-26 DIAGNOSIS — G629 Polyneuropathy, unspecified: Secondary | ICD-10-CM | POA: Diagnosis not present

## 2018-01-26 DIAGNOSIS — M7521 Bicipital tendinitis, right shoulder: Secondary | ICD-10-CM | POA: Diagnosis not present

## 2018-01-26 DIAGNOSIS — N320 Bladder-neck obstruction: Secondary | ICD-10-CM | POA: Insufficient documentation

## 2018-01-26 DIAGNOSIS — K219 Gastro-esophageal reflux disease without esophagitis: Secondary | ICD-10-CM | POA: Diagnosis not present

## 2018-01-26 DIAGNOSIS — J449 Chronic obstructive pulmonary disease, unspecified: Secondary | ICD-10-CM | POA: Diagnosis not present

## 2018-01-26 DIAGNOSIS — G8929 Other chronic pain: Secondary | ICD-10-CM | POA: Insufficient documentation

## 2018-01-26 DIAGNOSIS — N189 Chronic kidney disease, unspecified: Secondary | ICD-10-CM | POA: Diagnosis not present

## 2018-01-26 DIAGNOSIS — R41841 Cognitive communication deficit: Secondary | ICD-10-CM | POA: Diagnosis not present

## 2018-01-26 DIAGNOSIS — G2581 Restless legs syndrome: Secondary | ICD-10-CM | POA: Insufficient documentation

## 2018-01-26 DIAGNOSIS — R1312 Dysphagia, oropharyngeal phase: Secondary | ICD-10-CM | POA: Diagnosis not present

## 2018-01-26 DIAGNOSIS — F329 Major depressive disorder, single episode, unspecified: Secondary | ICD-10-CM | POA: Insufficient documentation

## 2018-01-26 DIAGNOSIS — M6281 Muscle weakness (generalized): Secondary | ICD-10-CM | POA: Diagnosis not present

## 2018-01-26 HISTORY — PX: IR CATHETER TUBE CHANGE: IMG717

## 2018-01-26 LAB — CBC WITH DIFFERENTIAL/PLATELET
ABS IMMATURE GRANULOCYTES: 0.13 10*3/uL — AB (ref 0.00–0.07)
BASOS PCT: 0 %
Basophils Absolute: 0 10*3/uL (ref 0.0–0.1)
Eosinophils Absolute: 1.2 10*3/uL — ABNORMAL HIGH (ref 0.0–0.5)
Eosinophils Relative: 10 %
HCT: 40.7 % (ref 39.0–52.0)
Hemoglobin: 12 g/dL — ABNORMAL LOW (ref 13.0–17.0)
Immature Granulocytes: 1 %
Lymphocytes Relative: 5 %
Lymphs Abs: 0.6 10*3/uL — ABNORMAL LOW (ref 0.7–4.0)
MCH: 27.8 pg (ref 26.0–34.0)
MCHC: 29.5 g/dL — ABNORMAL LOW (ref 30.0–36.0)
MCV: 94.4 fL (ref 80.0–100.0)
MONOS PCT: 8 %
Monocytes Absolute: 1 10*3/uL (ref 0.1–1.0)
NEUTROS ABS: 8.8 10*3/uL — AB (ref 1.7–7.7)
NEUTROS PCT: 76 %
NRBC: 0 % (ref 0.0–0.2)
PLATELETS: 225 10*3/uL (ref 150–400)
RBC: 4.31 MIL/uL (ref 4.22–5.81)
RDW: 15.9 % — ABNORMAL HIGH (ref 11.5–15.5)
WBC: 11.8 10*3/uL — ABNORMAL HIGH (ref 4.0–10.5)

## 2018-01-26 LAB — BASIC METABOLIC PANEL
ANION GAP: 8 (ref 5–15)
BUN: 25 mg/dL — ABNORMAL HIGH (ref 8–23)
CO2: 28 mmol/L (ref 22–32)
Calcium: 9.2 mg/dL (ref 8.9–10.3)
Chloride: 103 mmol/L (ref 98–111)
Creatinine, Ser: 1.67 mg/dL — ABNORMAL HIGH (ref 0.61–1.24)
GFR calc Af Amer: 43 mL/min — ABNORMAL LOW (ref >60)
GFR, EST NON AFRICAN AMERICAN: 37 mL/min — AB (ref >60)
GLUCOSE: 102 mg/dL — AB (ref 70–99)
POTASSIUM: 5.1 mmol/L (ref 3.5–5.1)
Sodium: 139 mmol/L (ref 135–145)

## 2018-01-26 LAB — PROTIME-INR
INR: 1
PROTHROMBIN TIME: 13.1 s (ref 11.4–15.2)

## 2018-01-26 MED ORDER — IOPAMIDOL (ISOVUE-300) INJECTION 61%
50.0000 mL | Freq: Once | INTRAVENOUS | Status: AC | PRN
  Administered 2018-01-26: 15 mL

## 2018-01-26 MED ORDER — SODIUM CHLORIDE 0.9 % IV SOLN
INTRAVENOUS | Status: DC
  Administered 2018-01-26: 15:00:00 via INTRAVENOUS

## 2018-01-26 MED ORDER — LIDOCAINE VISCOUS HCL 2 % MT SOLN
OROMUCOSAL | Status: AC
  Administered 2018-01-26: 15 mL via OROMUCOSAL
  Filled 2018-01-26: qty 15

## 2018-01-26 MED ORDER — CEFAZOLIN SODIUM-DEXTROSE 2-4 GM/100ML-% IV SOLN: 2.0000 g | Freq: Once | INTRAVENOUS | Status: DC

## 2018-01-26 MED ORDER — FENTANYL CITRATE (PF) 100 MCG/2ML IJ SOLN
INTRAMUSCULAR | Status: AC
  Filled 2018-01-26: qty 2

## 2018-01-26 MED ORDER — MIDAZOLAM HCL 2 MG/2ML IJ SOLN
INTRAMUSCULAR | Status: AC | PRN
  Administered 2018-01-26 (×2): .25 mg via INTRAVENOUS

## 2018-01-26 MED ORDER — VANCOMYCIN HCL IN DEXTROSE 1-5 GM/200ML-% IV SOLN
INTRAVENOUS | Status: AC
  Administered 2018-01-26: 1000 mg via INTRAVENOUS
  Filled 2018-01-26: qty 200

## 2018-01-26 MED ORDER — FENTANYL CITRATE (PF) 100 MCG/2ML IJ SOLN
INTRAMUSCULAR | Status: AC | PRN
  Administered 2018-01-26 (×3): 12.5 ug via INTRAVENOUS

## 2018-01-26 MED ORDER — LIDOCAINE HCL 1 % IJ SOLN
INTRAMUSCULAR | Status: AC
  Filled 2018-01-26: qty 20

## 2018-01-26 MED ORDER — CEFAZOLIN SODIUM-DEXTROSE 2-4 GM/100ML-% IV SOLN
INTRAVENOUS | Status: AC
  Filled 2018-01-26: qty 100

## 2018-01-26 MED ORDER — VANCOMYCIN HCL IN DEXTROSE 1-5 GM/200ML-% IV SOLN
1000.0000 mg | Freq: Once | INTRAVENOUS | Status: AC
  Administered 2018-01-26: 1000 mg via INTRAVENOUS

## 2018-01-26 MED ORDER — MIDAZOLAM HCL 2 MG/2ML IJ SOLN
INTRAMUSCULAR | Status: AC
  Filled 2018-01-26: qty 2

## 2018-01-26 MED ORDER — IOPAMIDOL (ISOVUE-300) INJECTION 61%
INTRAVENOUS | Status: AC
  Administered 2018-01-26: 15 mL
  Filled 2018-01-26: qty 50

## 2018-01-26 MED ORDER — LIDOCAINE VISCOUS HCL 2 % MT SOLN
15.0000 mL | Freq: Once | OROMUCOSAL | Status: AC
  Administered 2018-01-26: 15 mL via OROMUCOSAL

## 2018-01-26 NOTE — H&P (Addendum)
Referring Physician(s): McKenzie,P  Supervising Physician: Sandi Mariscal  Patient Status:  [facility]  Chief Complaint:  Chronic bladder outlet obstruction and urinary retention, benign prostatic hypertrophy  Subjective: Patient familiar to IR service from prior suprapubic catheter placement on 12/15/2017.  He has a history of chronic bladder outlet obstruction and urinary retention secondary to BPH.  He is not a surgical candidate.  He presents again today for suprapubic cath exchange and upsizing.  He currently denies fever, headache, chest pain, worsening dyspnea, abdominal pain, back pain, nausea, vomiting or bleeding.  He does have occasional cough and some mild left hip pain.  Past Medical History:  Diagnosis Date  . Acute pyelonephritis 12/23/2014  . Asthma   . BPH (benign prostatic hyperplasia)   . Chronic back pain   . Chronic hip pain   . CKD (chronic kidney disease)   . COPD (chronic obstructive pulmonary disease) ([place])   . Dementia ([place])   . Depression   . GERD (gastroesophageal reflux disease)   . Kidney stone   . Neuropathy   . Pulmonary nodules 12/25/2014  . Restless leg syndrome    Past Surgical History:  Procedure Laterality Date  . ABDOMINAL SURGERY     heria  . ESOPHAGOGASTRODUODENOSCOPY N/A 07/06/2017   Procedure: ESOPHAGOGASTRODUODENOSCOPY (EGD);  Surgeon: Rogene Houston, MD;  Location: [facility];  Service: Endoscopy;  Laterality: N/A;  . EYE SURGERY        Allergies: Bee venom; Penicillins; and Strawberry extract  Medications: Prior to Admission medications   Medication Sig Start Date End Date Taking? Authorizing Provider  aspirin EC 81 MG tablet Take 81 mg by mouth daily.   Yes [provider]  acetaminophen (TYLENOL) 500 MG tablet Take 500 mg by mouth 3 (three) times daily as needed for mild pain.     [provider]  albuterol (PROVENTIL HFA;VENTOLIN HFA) 108 (90 BASE) MCG/ACT inhaler Inhale 2 puffs into the lungs every 4  (four) hours as needed for wheezing or shortness of breath.    [provider]  baclofen (LIORESAL) 10 MG tablet Take 10 mg by mouth 2 (two) times daily.    [provider]  buPROPion (WELLBUTRIN SR) 150 MG 12 hr tablet Take 150 mg by mouth 2 (two) times daily.    [provider]  Calcium Citrate-Vitamin D (CALCIUM CITRATE+D3 PETITES) 200-250 MG-UNIT TABS Take 1 tablet by mouth daily.    [provider]  fluticasone (FLONASE) 50 MCG/ACT nasal spray Place 1 spray 2 (two) times daily into both nostrils.     [provider]  Fluticasone-Salmeterol (ADVAIR) 250-50 MCG/DOSE AEPB Inhale 1 puff 2 (two) times daily into the lungs.    [provider]  gabapentin (NEURONTIN) 400 MG capsule Take 400 mg by mouth 3 (three) times daily.    [provider]  ipratropium (ATROVENT) 0.02 % nebulizer solution Take 2.5 mLs (0.5 mg total) by nebulization every 6 (six) hours as needed for wheezing or shortness of breath. Patient taking differently: Take 0.5 mg by nebulization 4 (four) times daily.  07/07/17   Johnson, Clanford L, MD  memantine (NAMENDA) 5 MG tablet Take 1 tablet (5 mg total) by mouth 2 (two) times daily. 10/14/17   Kathie Dike, MD  nitrofurantoin (MACRODANTIN) 100 MG capsule Take 100 mg by mouth every 6 (six) hours. Starting 10/17/2017 x 5 days.    [provider]  nystatin (MYCOSTATIN/NYSTOP) powder Apply topically 2 (two) times daily. 11/11/17   Manuella Ghazi,  Pratik D, DO  pantoprazole (PROTONIX) 40 MG tablet Take 1 tablet (40 mg total) by mouth 2 (two) times daily before a meal. 07/07/17   Johnson, Clanford L, MD  polyethylene glycol (MIRALAX) packet Take 17 g by mouth daily. 10/14/17   Kathie Dike, MD  pramipexole (MIRAPEX) 0.125 MG tablet Take 0.125 mg by mouth at bedtime.    [provider]  sennosides-docusate sodium (SENOKOT-S) 8.6-50 MG tablet Take 1 tablet by mouth daily as needed for constipation.    [provider]   sertraline (ZOLOFT) 50 MG tablet Take 50 mg by mouth daily.    [provider]  tamsulosin (FLOMAX) 0.4 MG CAPS capsule Take 0.4 mg by mouth daily.     [provider]  traMADol (ULTRAM) 50 MG tablet Take 1 tablet (50 mg total) by mouth every 6 (six) hours as needed for severe pain. Patient taking differently: Take 50 mg by mouth 2 (two) times daily.  07/07/17   Johnson, Clanford L, MD  traZODone (DESYREL) 100 MG tablet Take 200 mg by mouth at bedtime.    [provider]  triamcinolone ointment (KENALOG) 0.5 % Apply 1 application topically 2 (two) times daily. Apply to affected areas of forearms twice daily.    [provider]  vitamin B-12 (CYANOCOBALAMIN) 1000 MCG tablet Take 1,000 mcg by mouth daily.    [provider]  Vitamin D, Ergocalciferol, (DRISDOL) 50000 units CAPS capsule Take 50,000 Units by mouth every Friday.     [provider]     Vital Signs:pending    Physical Exam awake, answering questions appropriately.  Chest clear to auscultation bilaterally.  Heart with normal rate, occasional ectopy.  Abdomen soft, positive bowel sounds, nontender.  Suprapubic catheter in place with small amount of drainage on gauze.  Slightly cloudy yellow urine in Foley. No LE edema.   Imaging: No results found.  Labs:  CBC: Recent Labs    11/09/17 0419 11/11/17 0506 12/15/17 0940 01/26/18 1200  WBC 6.0 6.0 7.8 11.8*  HGB 8.3* 8.6* 10.8* 12.0*  HCT 27.0* 28.2* 35.7* 40.7  PLT 242 232 284 225    COAGS: Recent Labs    07/06/17 0049 12/15/17 0940 01/26/18 1200  INR 1.06 1.04 1.00  APTT 63*  --   --     BMP: Recent Labs    11/09/17 0419 11/11/17 0506 12/15/17 0940 01/26/18 1200  NA 138 139 137 139  K 4.4 3.8 4.3 5.1  CL 111 111 103 103  CO2 21* 21* 24 28  GLUCOSE 93 75 103* 102*  BUN 34* 22 38* 25*  CALCIUM 7.9* 8.2* 8.4* 9.2  CREATININE 1.72* 1.67* 2.02* 1.67*  GFRNONAA 36* 37* 30* 37*  GFRAA 42* 43* 34* 43*     LIVER FUNCTION TESTS: Recent Labs    06/19/17 0021 07/06/17 0049 10/10/17 1416 10/14/17 0420 11/07/17 0010  BILITOT 0.7 0.6 0.5  --  0.4  AST 16 13* 13*  --  15  ALT 10* 10* 8  --  12  ALKPHOS 58 91 65  --  75  PROT 6.6 6.8 7.7  --  8.0  ALBUMIN 3.2* 3.2* 3.3* 2.7* 3.0*    Assessment and Plan: Pt with history of chronic bladder outlet obstruction and urinary retention secondary to BPH.  He is not a surgical candidate.  Status post suprapubic catheter placement on 12/15/2017.  He presents again today for suprapubic cath exchange and upsizing.  Details/risks of procedure, including but not limited to,  internal bleeding, infection, injury to adjacent structures discussed with patient with his understanding and consent.   Electronically Signed: D. Rowe Robert, PA-C 01/26/2018, 1:18 PM   I spent a total of 20 minutes at the the patient's bedside AND on the patient's hospital floor or unit, greater than 50% of which was counseling/coordinating care for suprapubic catheter exchange and upsizing

## 2018-01-26 NOTE — Procedures (Signed)
Pre procedural Dx: Urinary retention Post procedural Dx: Same  Technically successful fluoro guided exchange, upsizing and conversion of existing 14 Fr suprapubic catheter to an 18 Fr balloon retention tube with tip terminating within the urinary bladder.   EBL: Trace  Complications: None immediate  Ronny Bacon, MD Pager #: [phone]

## 2018-01-26 NOTE — Discharge Instructions (Signed)
Suprapubic Catheter Replacement, Care After Refer to this sheet in the next few weeks. These instructions provide you with information about caring for yourself after your procedure. Your health care provider may also give you more specific instructions. Your treatment has been planned according to current medical practices, but problems sometimes occur. Call your health care provider if you have any problems or questions after your procedure. What can I expect after the procedure? After your procedure, it is possible to have some discomfort around the opening in your abdomen. Follow these instructions at home: Caring for your skin around the catheter Use a clean washcloth and soapy water to clean the skin around your catheter every day. Pat the area dry with a clean towel.  Do not pull on the catheter.  Do not use ointment or lotion on this area unless told by your health care provider.  Check your skin around the catheter every day for signs of infection. Check for: ? Redness, swelling, or pain. ? Fluid or blood. ? Warmth. ? Pus or a bad smell.  Caring for the catheter tube  Clean the catheter tube with soap and water as often as told by your health care provider.  Always make sure there are no twists or curls (kinks) in the catheter tube. Emptying the collection bag Empty the large collection bag every 8 hours. Empty the small collection bag when it is about ? full. To empty your large or small collection bag, take the following steps:  Always keep the bag below the level of the catheter. This keeps urine from flowing backwards into the catheter.  Hold the bag over the toilet or another container. Turn the valve (spigot) at the bottom of the bag to empty the urine. ? Do not touch the opening of the spigot. ? Do not let the opening touch the toilet or container.  Close the spigot tightly when the bag is empty.  Cleaning the collection bag  Clean the collection bag every 2-3  days, or as often as told by your health care provider. To do this, take the following steps:  Wash your hands with soap and water. If soap and water are not available, use hand sanitizer.  Disconnect the bag from the catheter and immediately attach a new bag to the catheter.  Empty the used bag completely.  Clean the used bag using one of the following methods: ? Rinse the bag with warm water and soap. ? Fill the bag with water and add 1 tsp of vinegar. Let it sit for about 30 minutes, then empty the bag.  Let the bag dry completely, and put it in a clean plastic bag before storing it.  General instructions  Always wash your hands before and after caring for your catheter and collection bag. Use a mild, fragrance-free soap. If soap and water are not available, use hand sanitizer.  Always make sure there are no leaks in the catheter or collection bag.  Drink enough fluid to keep your urine clear or pale yellow.  If you were prescribed an antibiotic medicine, take it as told by your health care provider. Do not stop taking the antibiotic even if you start to feel better.  Do not take baths, swim, or use a hot tub.  Keep all follow-up appointments as told by your health care provider. This is important. Contact a health care provider if:  You leak urine.  You have redness, swelling, or pain around your catheter opening.  You have  fluid or blood coming from your catheter opening.  Your catheter opening feels warm to the touch.  You have pus or a bad smell coming from your catheter opening.  You have a fever or chills.  Your urine flow slows down.  Your urine becomes cloudy or smelly. Get help right away if:  Your catheter comes out.  You feel nauseous.  You have back pain.  You have difficulty changing your catheter.  You have blood in your urine.  You have no urine flow for 1 hour. This information is not intended to replace advice given to you by your health  care provider. Make sure you discuss any questions you have with your health care provider. Document Released: 11/17/2010 Document Revised: 10/29/2015 Document Reviewed: 11/12/2014 Elsevier Interactive Patient Education  2018 [place] not let urinary drainage bag drag on the floor.

## 2018-01-27 DIAGNOSIS — R1312 Dysphagia, oropharyngeal phase: Secondary | ICD-10-CM | POA: Diagnosis not present

## 2018-01-27 DIAGNOSIS — M7521 Bicipital tendinitis, right shoulder: Secondary | ICD-10-CM | POA: Diagnosis not present

## 2018-01-27 DIAGNOSIS — M6281 Muscle weakness (generalized): Secondary | ICD-10-CM | POA: Diagnosis not present

## 2018-01-27 DIAGNOSIS — R41841 Cognitive communication deficit: Secondary | ICD-10-CM | POA: Diagnosis not present

## 2018-01-28 DIAGNOSIS — R41841 Cognitive communication deficit: Secondary | ICD-10-CM | POA: Diagnosis not present

## 2018-01-28 DIAGNOSIS — M7521 Bicipital tendinitis, right shoulder: Secondary | ICD-10-CM | POA: Diagnosis not present

## 2018-01-28 DIAGNOSIS — M6281 Muscle weakness (generalized): Secondary | ICD-10-CM | POA: Diagnosis not present

## 2018-01-28 DIAGNOSIS — R1312 Dysphagia, oropharyngeal phase: Secondary | ICD-10-CM | POA: Diagnosis not present

## 2018-01-30 DIAGNOSIS — R1312 Dysphagia, oropharyngeal phase: Secondary | ICD-10-CM | POA: Diagnosis not present

## 2018-01-30 DIAGNOSIS — R41841 Cognitive communication deficit: Secondary | ICD-10-CM | POA: Diagnosis not present

## 2018-01-30 DIAGNOSIS — M7521 Bicipital tendinitis, right shoulder: Secondary | ICD-10-CM | POA: Diagnosis not present

## 2018-01-30 DIAGNOSIS — R0902 Hypoxemia: Secondary | ICD-10-CM | POA: Diagnosis not present

## 2018-01-30 DIAGNOSIS — M6281 Muscle weakness (generalized): Secondary | ICD-10-CM | POA: Diagnosis not present

## 2018-01-31 DIAGNOSIS — M7521 Bicipital tendinitis, right shoulder: Secondary | ICD-10-CM | POA: Diagnosis not present

## 2018-01-31 DIAGNOSIS — M6281 Muscle weakness (generalized): Secondary | ICD-10-CM | POA: Diagnosis not present

## 2018-01-31 DIAGNOSIS — R1312 Dysphagia, oropharyngeal phase: Secondary | ICD-10-CM | POA: Diagnosis not present

## 2018-01-31 DIAGNOSIS — R41841 Cognitive communication deficit: Secondary | ICD-10-CM | POA: Diagnosis not present

## 2018-02-01 DIAGNOSIS — N183 Chronic kidney disease, stage 3 (moderate): Secondary | ICD-10-CM | POA: Diagnosis not present

## 2018-02-01 DIAGNOSIS — L0291 Cutaneous abscess, unspecified: Secondary | ICD-10-CM | POA: Diagnosis not present

## 2018-02-01 DIAGNOSIS — R41841 Cognitive communication deficit: Secondary | ICD-10-CM | POA: Diagnosis not present

## 2018-02-01 DIAGNOSIS — M6281 Muscle weakness (generalized): Secondary | ICD-10-CM | POA: Diagnosis not present

## 2018-02-01 DIAGNOSIS — R1312 Dysphagia, oropharyngeal phase: Secondary | ICD-10-CM | POA: Diagnosis not present

## 2018-02-01 DIAGNOSIS — I700 Atherosclerosis of aorta: Secondary | ICD-10-CM | POA: Diagnosis not present

## 2018-02-01 DIAGNOSIS — M7521 Bicipital tendinitis, right shoulder: Secondary | ICD-10-CM | POA: Diagnosis not present

## 2018-02-01 DIAGNOSIS — J449 Chronic obstructive pulmonary disease, unspecified: Secondary | ICD-10-CM | POA: Diagnosis not present

## 2018-02-02 DIAGNOSIS — M7521 Bicipital tendinitis, right shoulder: Secondary | ICD-10-CM | POA: Diagnosis not present

## 2018-02-02 DIAGNOSIS — R41841 Cognitive communication deficit: Secondary | ICD-10-CM | POA: Diagnosis not present

## 2018-02-02 DIAGNOSIS — M6281 Muscle weakness (generalized): Secondary | ICD-10-CM | POA: Diagnosis not present

## 2018-02-02 DIAGNOSIS — R1312 Dysphagia, oropharyngeal phase: Secondary | ICD-10-CM | POA: Diagnosis not present

## 2018-02-03 DIAGNOSIS — R1312 Dysphagia, oropharyngeal phase: Secondary | ICD-10-CM | POA: Diagnosis not present

## 2018-02-03 DIAGNOSIS — M7521 Bicipital tendinitis, right shoulder: Secondary | ICD-10-CM | POA: Diagnosis not present

## 2018-02-03 DIAGNOSIS — R41841 Cognitive communication deficit: Secondary | ICD-10-CM | POA: Diagnosis not present

## 2018-02-03 DIAGNOSIS — M6281 Muscle weakness (generalized): Secondary | ICD-10-CM | POA: Diagnosis not present

## 2018-02-07 DIAGNOSIS — G9389 Other specified disorders of brain: Secondary | ICD-10-CM | POA: Diagnosis not present

## 2018-02-07 DIAGNOSIS — F17200 Nicotine dependence, unspecified, uncomplicated: Secondary | ICD-10-CM | POA: Diagnosis present

## 2018-02-07 DIAGNOSIS — Z7982 Long term (current) use of aspirin: Secondary | ICD-10-CM | POA: Diagnosis not present

## 2018-02-07 DIAGNOSIS — R6521 Severe sepsis with septic shock: Secondary | ICD-10-CM | POA: Diagnosis present

## 2018-02-07 DIAGNOSIS — B964 Proteus (mirabilis) (morganii) as the cause of diseases classified elsewhere: Secondary | ICD-10-CM | POA: Diagnosis not present

## 2018-02-07 DIAGNOSIS — R918 Other nonspecific abnormal finding of lung field: Secondary | ICD-10-CM | POA: Diagnosis not present

## 2018-02-07 DIAGNOSIS — Z880 Allergy status to penicillin: Secondary | ICD-10-CM | POA: Diagnosis not present

## 2018-02-07 DIAGNOSIS — N179 Acute kidney failure, unspecified: Secondary | ICD-10-CM | POA: Diagnosis not present

## 2018-02-07 DIAGNOSIS — F0390 Unspecified dementia without behavioral disturbance: Secondary | ICD-10-CM | POA: Diagnosis not present

## 2018-02-07 DIAGNOSIS — R0902 Hypoxemia: Secondary | ICD-10-CM | POA: Diagnosis not present

## 2018-02-07 DIAGNOSIS — N10 Acute tubulo-interstitial nephritis: Secondary | ICD-10-CM | POA: Diagnosis not present

## 2018-02-07 DIAGNOSIS — R404 Transient alteration of awareness: Secondary | ICD-10-CM | POA: Diagnosis not present

## 2018-02-07 DIAGNOSIS — K219 Gastro-esophageal reflux disease without esophagitis: Secondary | ICD-10-CM | POA: Diagnosis present

## 2018-02-07 DIAGNOSIS — J189 Pneumonia, unspecified organism: Secondary | ICD-10-CM | POA: Diagnosis present

## 2018-02-07 DIAGNOSIS — J45909 Unspecified asthma, uncomplicated: Secondary | ICD-10-CM | POA: Diagnosis not present

## 2018-02-07 DIAGNOSIS — R578 Other shock: Secondary | ICD-10-CM | POA: Diagnosis not present

## 2018-02-07 DIAGNOSIS — R509 Fever, unspecified: Secondary | ICD-10-CM | POA: Diagnosis not present

## 2018-02-07 DIAGNOSIS — A419 Sepsis, unspecified organism: Secondary | ICD-10-CM | POA: Diagnosis not present

## 2018-02-07 DIAGNOSIS — I959 Hypotension, unspecified: Secondary | ICD-10-CM | POA: Diagnosis not present

## 2018-02-07 DIAGNOSIS — Z79899 Other long term (current) drug therapy: Secondary | ICD-10-CM | POA: Diagnosis not present

## 2018-02-07 DIAGNOSIS — N390 Urinary tract infection, site not specified: Secondary | ICD-10-CM | POA: Diagnosis not present

## 2018-02-07 DIAGNOSIS — Z91030 Bee allergy status: Secondary | ICD-10-CM | POA: Diagnosis not present

## 2018-02-07 DIAGNOSIS — Z9842 Cataract extraction status, left eye: Secondary | ICD-10-CM | POA: Diagnosis not present

## 2018-02-07 DIAGNOSIS — Z91018 Allergy to other foods: Secondary | ICD-10-CM | POA: Diagnosis not present

## 2018-02-07 DIAGNOSIS — T83511A Infection and inflammatory reaction due to indwelling urethral catheter, initial encounter: Secondary | ICD-10-CM | POA: Diagnosis present

## 2018-02-17 DIAGNOSIS — D519 Vitamin B12 deficiency anemia, unspecified: Secondary | ICD-10-CM | POA: Diagnosis not present

## 2018-02-17 DIAGNOSIS — I10 Essential (primary) hypertension: Secondary | ICD-10-CM | POA: Diagnosis not present

## 2018-02-17 DIAGNOSIS — D649 Anemia, unspecified: Secondary | ICD-10-CM | POA: Diagnosis not present

## 2018-02-17 DIAGNOSIS — E559 Vitamin D deficiency, unspecified: Secondary | ICD-10-CM | POA: Diagnosis not present

## 2018-02-22 DIAGNOSIS — F4312 Post-traumatic stress disorder, chronic: Secondary | ICD-10-CM | POA: Diagnosis not present

## 2018-02-22 DIAGNOSIS — N183 Chronic kidney disease, stage 3 (moderate): Secondary | ICD-10-CM | POA: Diagnosis not present

## 2018-02-22 DIAGNOSIS — C4442 Squamous cell carcinoma of skin of scalp and neck: Secondary | ICD-10-CM | POA: Diagnosis not present

## 2018-02-22 DIAGNOSIS — G2581 Restless legs syndrome: Secondary | ICD-10-CM | POA: Diagnosis not present

## 2018-02-22 DIAGNOSIS — C44212 Basal cell carcinoma of skin of right ear and external auricular canal: Secondary | ICD-10-CM | POA: Diagnosis not present

## 2018-02-22 DIAGNOSIS — I700 Atherosclerosis of aorta: Secondary | ICD-10-CM | POA: Diagnosis not present

## 2018-02-22 DIAGNOSIS — J449 Chronic obstructive pulmonary disease, unspecified: Secondary | ICD-10-CM | POA: Diagnosis not present

## 2018-02-22 DIAGNOSIS — F331 Major depressive disorder, recurrent, moderate: Secondary | ICD-10-CM | POA: Diagnosis not present

## 2018-02-22 DIAGNOSIS — C4402 Squamous cell carcinoma of skin of lip: Secondary | ICD-10-CM | POA: Diagnosis not present

## 2018-02-22 DIAGNOSIS — F0390 Unspecified dementia without behavioral disturbance: Secondary | ICD-10-CM | POA: Diagnosis not present

## 2018-02-22 DIAGNOSIS — F17211 Nicotine dependence, cigarettes, in remission: Secondary | ICD-10-CM | POA: Diagnosis not present

## 2018-02-27 DIAGNOSIS — Z7951 Long term (current) use of inhaled steroids: Secondary | ICD-10-CM | POA: Diagnosis not present

## 2018-02-27 DIAGNOSIS — H2511 Age-related nuclear cataract, right eye: Secondary | ICD-10-CM | POA: Diagnosis not present

## 2018-02-27 DIAGNOSIS — Z961 Presence of intraocular lens: Secondary | ICD-10-CM | POA: Diagnosis not present

## 2018-03-01 ENCOUNTER — Ambulatory Visit (INDEPENDENT_AMBULATORY_CARE_PROVIDER_SITE_OTHER): Payer: Medicare Other | Admitting: Urology

## 2018-03-01 DIAGNOSIS — M6281 Muscle weakness (generalized): Secondary | ICD-10-CM | POA: Diagnosis not present

## 2018-03-01 DIAGNOSIS — N401 Enlarged prostate with lower urinary tract symptoms: Secondary | ICD-10-CM

## 2018-03-01 DIAGNOSIS — C4402 Squamous cell carcinoma of skin of lip: Secondary | ICD-10-CM | POA: Diagnosis not present

## 2018-03-01 DIAGNOSIS — N183 Chronic kidney disease, stage 3 (moderate): Secondary | ICD-10-CM | POA: Diagnosis not present

## 2018-03-01 DIAGNOSIS — F0390 Unspecified dementia without behavioral disturbance: Secondary | ICD-10-CM | POA: Diagnosis not present

## 2018-03-01 DIAGNOSIS — R1312 Dysphagia, oropharyngeal phase: Secondary | ICD-10-CM | POA: Diagnosis not present

## 2018-03-01 DIAGNOSIS — N12 Tubulo-interstitial nephritis, not specified as acute or chronic: Secondary | ICD-10-CM | POA: Diagnosis not present

## 2018-03-01 DIAGNOSIS — R41841 Cognitive communication deficit: Secondary | ICD-10-CM | POA: Diagnosis not present

## 2018-03-01 DIAGNOSIS — M7521 Bicipital tendinitis, right shoulder: Secondary | ICD-10-CM | POA: Diagnosis not present

## 2018-03-02 DIAGNOSIS — R1312 Dysphagia, oropharyngeal phase: Secondary | ICD-10-CM | POA: Diagnosis not present

## 2018-03-02 DIAGNOSIS — C4442 Squamous cell carcinoma of skin of scalp and neck: Secondary | ICD-10-CM | POA: Diagnosis not present

## 2018-03-02 DIAGNOSIS — R41841 Cognitive communication deficit: Secondary | ICD-10-CM | POA: Diagnosis not present

## 2018-03-02 DIAGNOSIS — M6281 Muscle weakness (generalized): Secondary | ICD-10-CM | POA: Diagnosis not present

## 2018-03-02 DIAGNOSIS — M7521 Bicipital tendinitis, right shoulder: Secondary | ICD-10-CM | POA: Diagnosis not present

## 2018-03-02 DIAGNOSIS — C4402 Squamous cell carcinoma of skin of lip: Secondary | ICD-10-CM | POA: Diagnosis not present

## 2018-03-03 DIAGNOSIS — R41841 Cognitive communication deficit: Secondary | ICD-10-CM | POA: Diagnosis not present

## 2018-03-03 DIAGNOSIS — M6281 Muscle weakness (generalized): Secondary | ICD-10-CM | POA: Diagnosis not present

## 2018-03-03 DIAGNOSIS — R1312 Dysphagia, oropharyngeal phase: Secondary | ICD-10-CM | POA: Diagnosis not present

## 2018-03-03 DIAGNOSIS — M7521 Bicipital tendinitis, right shoulder: Secondary | ICD-10-CM | POA: Diagnosis not present

## 2018-03-05 DIAGNOSIS — M6281 Muscle weakness (generalized): Secondary | ICD-10-CM | POA: Diagnosis not present

## 2018-03-05 DIAGNOSIS — R1312 Dysphagia, oropharyngeal phase: Secondary | ICD-10-CM | POA: Diagnosis not present

## 2018-03-05 DIAGNOSIS — M7521 Bicipital tendinitis, right shoulder: Secondary | ICD-10-CM | POA: Diagnosis not present

## 2018-03-05 DIAGNOSIS — R41841 Cognitive communication deficit: Secondary | ICD-10-CM | POA: Diagnosis not present

## 2018-03-07 DIAGNOSIS — M6281 Muscle weakness (generalized): Secondary | ICD-10-CM | POA: Diagnosis not present

## 2018-03-07 DIAGNOSIS — R1312 Dysphagia, oropharyngeal phase: Secondary | ICD-10-CM | POA: Diagnosis not present

## 2018-03-07 DIAGNOSIS — M7521 Bicipital tendinitis, right shoulder: Secondary | ICD-10-CM | POA: Diagnosis not present

## 2018-03-07 DIAGNOSIS — R41841 Cognitive communication deficit: Secondary | ICD-10-CM | POA: Diagnosis not present

## 2018-03-08 DIAGNOSIS — M7521 Bicipital tendinitis, right shoulder: Secondary | ICD-10-CM | POA: Diagnosis not present

## 2018-03-08 DIAGNOSIS — R1312 Dysphagia, oropharyngeal phase: Secondary | ICD-10-CM | POA: Diagnosis not present

## 2018-03-08 DIAGNOSIS — M6281 Muscle weakness (generalized): Secondary | ICD-10-CM | POA: Diagnosis not present

## 2018-03-08 DIAGNOSIS — R41841 Cognitive communication deficit: Secondary | ICD-10-CM | POA: Diagnosis not present

## 2018-03-09 DIAGNOSIS — M7521 Bicipital tendinitis, right shoulder: Secondary | ICD-10-CM | POA: Diagnosis not present

## 2018-03-09 DIAGNOSIS — M6281 Muscle weakness (generalized): Secondary | ICD-10-CM | POA: Diagnosis not present

## 2018-03-09 DIAGNOSIS — R41841 Cognitive communication deficit: Secondary | ICD-10-CM | POA: Diagnosis not present

## 2018-03-09 DIAGNOSIS — R1312 Dysphagia, oropharyngeal phase: Secondary | ICD-10-CM | POA: Diagnosis not present

## 2018-03-11 DIAGNOSIS — R41841 Cognitive communication deficit: Secondary | ICD-10-CM | POA: Diagnosis not present

## 2018-03-11 DIAGNOSIS — M6281 Muscle weakness (generalized): Secondary | ICD-10-CM | POA: Diagnosis not present

## 2018-03-11 DIAGNOSIS — R1312 Dysphagia, oropharyngeal phase: Secondary | ICD-10-CM | POA: Diagnosis not present

## 2018-03-11 DIAGNOSIS — M7521 Bicipital tendinitis, right shoulder: Secondary | ICD-10-CM | POA: Diagnosis not present

## 2018-03-13 DIAGNOSIS — M7521 Bicipital tendinitis, right shoulder: Secondary | ICD-10-CM | POA: Diagnosis not present

## 2018-03-13 DIAGNOSIS — R1312 Dysphagia, oropharyngeal phase: Secondary | ICD-10-CM | POA: Diagnosis not present

## 2018-03-13 DIAGNOSIS — M6281 Muscle weakness (generalized): Secondary | ICD-10-CM | POA: Diagnosis not present

## 2018-03-13 DIAGNOSIS — R41841 Cognitive communication deficit: Secondary | ICD-10-CM | POA: Diagnosis not present

## 2018-03-14 DIAGNOSIS — R41841 Cognitive communication deficit: Secondary | ICD-10-CM | POA: Diagnosis not present

## 2018-03-14 DIAGNOSIS — M6281 Muscle weakness (generalized): Secondary | ICD-10-CM | POA: Diagnosis not present

## 2018-03-14 DIAGNOSIS — R1312 Dysphagia, oropharyngeal phase: Secondary | ICD-10-CM | POA: Diagnosis not present

## 2018-03-14 DIAGNOSIS — M7521 Bicipital tendinitis, right shoulder: Secondary | ICD-10-CM | POA: Diagnosis not present

## 2018-03-15 DIAGNOSIS — M7521 Bicipital tendinitis, right shoulder: Secondary | ICD-10-CM | POA: Diagnosis not present

## 2018-03-15 DIAGNOSIS — M6281 Muscle weakness (generalized): Secondary | ICD-10-CM | POA: Diagnosis not present

## 2018-03-15 DIAGNOSIS — R1312 Dysphagia, oropharyngeal phase: Secondary | ICD-10-CM | POA: Diagnosis not present

## 2018-03-15 DIAGNOSIS — R41841 Cognitive communication deficit: Secondary | ICD-10-CM | POA: Diagnosis not present

## 2018-03-16 DIAGNOSIS — R1312 Dysphagia, oropharyngeal phase: Secondary | ICD-10-CM | POA: Diagnosis not present

## 2018-03-16 DIAGNOSIS — R41841 Cognitive communication deficit: Secondary | ICD-10-CM | POA: Diagnosis not present

## 2018-03-16 DIAGNOSIS — M7521 Bicipital tendinitis, right shoulder: Secondary | ICD-10-CM | POA: Diagnosis not present

## 2018-03-16 DIAGNOSIS — M6281 Muscle weakness (generalized): Secondary | ICD-10-CM | POA: Diagnosis not present

## 2018-03-18 DIAGNOSIS — M6281 Muscle weakness (generalized): Secondary | ICD-10-CM | POA: Diagnosis not present

## 2018-03-18 DIAGNOSIS — R41841 Cognitive communication deficit: Secondary | ICD-10-CM | POA: Diagnosis not present

## 2018-03-18 DIAGNOSIS — M7521 Bicipital tendinitis, right shoulder: Secondary | ICD-10-CM | POA: Diagnosis not present

## 2018-03-18 DIAGNOSIS — R1312 Dysphagia, oropharyngeal phase: Secondary | ICD-10-CM | POA: Diagnosis not present

## 2018-03-20 DIAGNOSIS — R41841 Cognitive communication deficit: Secondary | ICD-10-CM | POA: Diagnosis not present

## 2018-03-20 DIAGNOSIS — M7521 Bicipital tendinitis, right shoulder: Secondary | ICD-10-CM | POA: Diagnosis not present

## 2018-03-20 DIAGNOSIS — R1312 Dysphagia, oropharyngeal phase: Secondary | ICD-10-CM | POA: Diagnosis not present

## 2018-03-20 DIAGNOSIS — M6281 Muscle weakness (generalized): Secondary | ICD-10-CM | POA: Diagnosis not present

## 2018-03-21 ENCOUNTER — Ambulatory Visit (INDEPENDENT_AMBULATORY_CARE_PROVIDER_SITE_OTHER): Payer: Medicare Other | Admitting: Plastic Surgery

## 2018-03-21 ENCOUNTER — Encounter: Payer: Self-pay | Admitting: Plastic Surgery

## 2018-03-21 VITALS — BP 114/76 | HR 88 | Temp 97.8°F

## 2018-03-21 DIAGNOSIS — C4440 Unspecified malignant neoplasm of skin of scalp and neck: Secondary | ICD-10-CM | POA: Diagnosis not present

## 2018-03-21 DIAGNOSIS — Z720 Tobacco use: Secondary | ICD-10-CM

## 2018-03-21 DIAGNOSIS — S0100XA Unspecified open wound of scalp, initial encounter: Secondary | ICD-10-CM

## 2018-03-21 DIAGNOSIS — C001 Malignant neoplasm of external lower lip: Secondary | ICD-10-CM | POA: Diagnosis not present

## 2018-03-21 DIAGNOSIS — F0390 Unspecified dementia without behavioral disturbance: Secondary | ICD-10-CM

## 2018-03-21 DIAGNOSIS — C4402 Squamous cell carcinoma of skin of lip: Secondary | ICD-10-CM | POA: Diagnosis not present

## 2018-03-21 NOTE — Progress Notes (Signed)
Patient ID: Anthony [name], male    DOB: 04-07-1940, 78 y.o.   MRN: [id number]   Chief Complaint  Patient presents with  . Skin Problem    Mr. Oyen is a 78 year old white male here for evaluation of skin lesions.  He lives in a nursing facility.  He is in a wheelchair today.  He has a history of dementia and depression.  He has a strong history of tobacco use via cigarettes.  He is now a ward of the state and will have to go in front of the board for decisions of his medical care.  She is aware that he has a large lesion on his lower lip.  He complains that it is inhibiting him from eating well.  It is 90% of the lower lip, raised, ulcerated, red and tender.  According to the records he was diagnosed with squamous cell carcinoma by way of a biopsy.  This has been excised in the past and recurred.  He also has a 4-1/2 cm lesion on the top of his head.  This is nontender at this point in time.  It is flesh-colored and raised.  It is been excised in the past as well.  In the past few years he has had several ER episodes from falling at the nursing home, pain in joints and upper GI bleed.  He has an appointment with Dr. Redmond Baseman for evaluation as well.   Review of Systems  Constitutional: Positive for activity change and appetite change.  HENT: Positive for drooling.   Eyes: Negative.   Respiratory: Positive for cough. Negative for chest tightness and stridor.   Endocrine: Negative.   Musculoskeletal: Negative.   Skin: Positive for color change and wound.  Neurological: Negative for tremors and facial asymmetry.  Psychiatric/Behavioral: Positive for agitation. The patient is nervous/anxious.     Past Medical History:  Diagnosis Date  . Acute pyelonephritis 12/23/2014  . Asthma   . BPH (benign prostatic hyperplasia)   . Chronic back pain   . Chronic hip pain   . CKD (chronic kidney disease)   . COPD (chronic obstructive pulmonary disease) ([employer])   . Dementia ([place])   . Depression   . GERD  (gastroesophageal reflux disease)   . Kidney stone   . Neuropathy   . Pulmonary nodules 12/25/2014  . Restless leg syndrome     Past Surgical History:  Procedure Laterality Date  . ABDOMINAL SURGERY     heria  . ESOPHAGOGASTRODUODENOSCOPY N/A 07/06/2017   Procedure: ESOPHAGOGASTRODUODENOSCOPY (EGD);  Surgeon: Rogene Houston, MD;  Location: [facility];  Service: Endoscopy;  Laterality: N/A;  . EYE SURGERY    . IR CATHETER TUBE CHANGE  01/26/2018      Current Outpatient Medications:  .  acetaminophen (TYLENOL) 500 MG tablet, Take 500 mg by mouth 3 (three) times daily as needed for mild pain. , Disp: , Rfl:  .  albuterol (PROVENTIL HFA;VENTOLIN HFA) 108 (90 BASE) MCG/ACT inhaler, Inhale 2 puffs into the lungs every 4 (four) hours as needed for wheezing or shortness of breath., Disp: , Rfl:  .  aspirin EC 81 MG tablet, Take 81 mg by mouth daily., Disp: , Rfl:  .  baclofen (LIORESAL) 10 MG tablet, Take 10 mg by mouth 2 (two) times daily., Disp: , Rfl:  .  buPROPion (WELLBUTRIN SR) 150 MG 12 hr tablet, Take 150 mg by mouth 2 (two) times daily., Disp: , Rfl:  .  Calcium Citrate-Vitamin D (  CALCIUM CITRATE+D3 PETITES) 200-250 MG-UNIT TABS, Take 1 tablet by mouth daily., Disp: , Rfl:  .  famotidine (PEPCID) 40 MG tablet, Take 40 mg by mouth daily., Disp: , Rfl:  .  finasteride (PROSCAR) 5 MG tablet, Take 5 mg by mouth daily., Disp: , Rfl:  .  fluticasone (FLONASE) 50 MCG/ACT nasal spray, Place 1 spray 2 (two) times daily into both nostrils. , Disp: , Rfl:  .  Fluticasone-Salmeterol (ADVAIR) 250-50 MCG/DOSE AEPB, Inhale 1 puff 2 (two) times daily into the lungs., Disp: , Rfl:  .  gabapentin (NEURONTIN) 400 MG capsule, Take 400 mg by mouth 3 (three) times daily., Disp: , Rfl:  .  ipratropium (ATROVENT) 0.02 % nebulizer solution, Take 2.5 mLs (0.5 mg total) by nebulization every 6 (six) hours as needed for wheezing or shortness of breath. (Patient taking differently: Take 0.5 mg by nebulization 4  (four) times daily. ), Disp: 75 mL, Rfl: 12 .  memantine (NAMENDA) 5 MG tablet, Take 1 tablet (5 mg total) by mouth 2 (two) times daily., Disp: , Rfl:  .  nitrofurantoin (MACRODANTIN) 100 MG capsule, Take 100 mg by mouth every 6 (six) hours. Starting 10/17/2017 x 5 days., Disp: , Rfl:  .  nystatin (MYCOSTATIN/NYSTOP) powder, Apply topically 2 (two) times daily., Disp: 15 g, Rfl: 0 .  oxybutynin (DITROPAN) 5 MG tablet, Take 5 mg by mouth 2 (two) times daily., Disp: , Rfl:  .  oxycodone (OXY-IR) 5 MG capsule, Take 5 mg by mouth 2 (two) times daily as needed., Disp: , Rfl:  .  pantoprazole (PROTONIX) 40 MG tablet, Take 1 tablet (40 mg total) by mouth 2 (two) times daily before a meal., Disp: , Rfl:  .  polyethylene glycol (MIRALAX) packet, Take 17 g by mouth daily., Disp: 14 each, Rfl: 0 .  pramipexole (MIRAPEX) 0.125 MG tablet, Take 0.125 mg by mouth at bedtime., Disp: , Rfl:  .  prazosin (MINIPRESS) 1 MG capsule, Take 1 mg by mouth at bedtime., Disp: , Rfl:  .  sennosides-docusate sodium (SENOKOT-S) 8.6-50 MG tablet, Take 1 tablet by mouth daily as needed for constipation., Disp: , Rfl:  .  sertraline (ZOLOFT) 50 MG tablet, Take 50 mg by mouth daily., Disp: , Rfl:  .  tamsulosin (FLOMAX) 0.4 MG CAPS capsule, Take 0.4 mg by mouth daily. , Disp: , Rfl:  .  traMADol (ULTRAM) 50 MG tablet, Take 1 tablet (50 mg total) by mouth every 6 (six) hours as needed for severe pain. (Patient taking differently: Take 50 mg by mouth 2 (two) times daily. ), Disp: 10 tablet, Rfl: 0 .  traZODone (DESYREL) 100 MG tablet, Take 150 mg by mouth at bedtime. , Disp: , Rfl:  .  triamcinolone ointment (KENALOG) 0.5 %, Apply 1 application topically 2 (two) times daily. Apply to affected areas of forearms twice daily., Disp: , Rfl:  .  vitamin B-12 (CYANOCOBALAMIN) 1000 MCG tablet, Take 1,000 mcg by mouth daily., Disp: , Rfl:  .  Vitamin D, Ergocalciferol, (DRISDOL) 50000 units CAPS capsule, Take 50,000 Units by mouth every Friday.  , Disp: , Rfl:    Objective:   Vitals:   03/21/18 1014  BP: 114/76  Pulse: 88  Temp: 97.8 F (36.6 C)  SpO2: 100%    Physical Exam Vitals signs and nursing note reviewed.  Constitutional:      Appearance: Normal appearance.  HENT:     Head: Normocephalic.      Right Ear: External ear normal.  Left Ear: External ear normal.     Mouth/Throat:   Eyes:     Extraocular Movements: Extraocular movements intact.  Cardiovascular:     Rate and Rhythm: Normal rate.  Pulmonary:     Effort: Pulmonary effort is normal. No respiratory distress.  Skin:    General: Skin is warm.     Findings: Lesion present.  Neurological:     General: No focal deficit present.     Mental Status: He is alert.  Psychiatric:        Mood and Affect: Mood normal.     Assessment & Plan:  Dementia without behavioral disturbance, unspecified dementia type (HCC)  Tobacco abuse  Open wound of scalp, unspecified open wound type, initial encounter  Skin cancer of lip We had a long discussion about the pros and cons of treatment.  At this point in time the focus seems to be quality of life and his ability to eat independently.  I recommend focusing on the lip for now as this is what is affecting his enjoyment and quality of life and his ability to feed himself and eat without difficulty.  I would like to talk to them after they are seen by Dr. Redmond Baseman and collaborate with the plan.  The social worker is aware of this and will let us know once he has been seen.Loel Lofty Dillingham, DO

## 2018-03-22 DIAGNOSIS — M7521 Bicipital tendinitis, right shoulder: Secondary | ICD-10-CM | POA: Diagnosis not present

## 2018-03-22 DIAGNOSIS — M6281 Muscle weakness (generalized): Secondary | ICD-10-CM | POA: Diagnosis not present

## 2018-03-22 DIAGNOSIS — I10 Essential (primary) hypertension: Secondary | ICD-10-CM | POA: Diagnosis not present

## 2018-03-22 DIAGNOSIS — R41841 Cognitive communication deficit: Secondary | ICD-10-CM | POA: Diagnosis not present

## 2018-03-22 DIAGNOSIS — R1312 Dysphagia, oropharyngeal phase: Secondary | ICD-10-CM | POA: Diagnosis not present

## 2018-03-23 DIAGNOSIS — R41841 Cognitive communication deficit: Secondary | ICD-10-CM | POA: Diagnosis not present

## 2018-03-23 DIAGNOSIS — M6281 Muscle weakness (generalized): Secondary | ICD-10-CM | POA: Diagnosis not present

## 2018-03-23 DIAGNOSIS — G9389 Other specified disorders of brain: Secondary | ICD-10-CM | POA: Diagnosis not present

## 2018-03-23 DIAGNOSIS — M7521 Bicipital tendinitis, right shoulder: Secondary | ICD-10-CM | POA: Diagnosis not present

## 2018-03-23 DIAGNOSIS — R1312 Dysphagia, oropharyngeal phase: Secondary | ICD-10-CM | POA: Diagnosis not present

## 2018-03-23 DIAGNOSIS — C4400 Unspecified malignant neoplasm of skin of lip: Secondary | ICD-10-CM | POA: Diagnosis not present

## 2018-03-23 DIAGNOSIS — C004 Malignant neoplasm of lower lip, inner aspect: Secondary | ICD-10-CM | POA: Diagnosis not present

## 2018-03-23 DIAGNOSIS — C4440 Unspecified malignant neoplasm of skin of scalp and neck: Secondary | ICD-10-CM | POA: Diagnosis not present

## 2018-03-24 DIAGNOSIS — M6281 Muscle weakness (generalized): Secondary | ICD-10-CM | POA: Diagnosis not present

## 2018-03-24 DIAGNOSIS — M7521 Bicipital tendinitis, right shoulder: Secondary | ICD-10-CM | POA: Diagnosis not present

## 2018-03-24 DIAGNOSIS — R1312 Dysphagia, oropharyngeal phase: Secondary | ICD-10-CM | POA: Diagnosis not present

## 2018-03-24 DIAGNOSIS — R41841 Cognitive communication deficit: Secondary | ICD-10-CM | POA: Diagnosis not present

## 2018-03-27 DIAGNOSIS — N183 Chronic kidney disease, stage 3 (moderate): Secondary | ICD-10-CM | POA: Diagnosis not present

## 2018-03-27 DIAGNOSIS — M7521 Bicipital tendinitis, right shoulder: Secondary | ICD-10-CM | POA: Diagnosis not present

## 2018-03-27 DIAGNOSIS — M6281 Muscle weakness (generalized): Secondary | ICD-10-CM | POA: Diagnosis not present

## 2018-03-27 DIAGNOSIS — R1312 Dysphagia, oropharyngeal phase: Secondary | ICD-10-CM | POA: Diagnosis not present

## 2018-03-27 DIAGNOSIS — J449 Chronic obstructive pulmonary disease, unspecified: Secondary | ICD-10-CM | POA: Diagnosis not present

## 2018-03-27 DIAGNOSIS — G2581 Restless legs syndrome: Secondary | ICD-10-CM | POA: Diagnosis not present

## 2018-03-27 DIAGNOSIS — C4402 Squamous cell carcinoma of skin of lip: Secondary | ICD-10-CM | POA: Diagnosis not present

## 2018-03-27 DIAGNOSIS — R41841 Cognitive communication deficit: Secondary | ICD-10-CM | POA: Diagnosis not present

## 2018-03-28 DIAGNOSIS — M7521 Bicipital tendinitis, right shoulder: Secondary | ICD-10-CM | POA: Diagnosis not present

## 2018-03-28 DIAGNOSIS — M6281 Muscle weakness (generalized): Secondary | ICD-10-CM | POA: Diagnosis not present

## 2018-03-28 DIAGNOSIS — R1312 Dysphagia, oropharyngeal phase: Secondary | ICD-10-CM | POA: Diagnosis not present

## 2018-03-28 DIAGNOSIS — R41841 Cognitive communication deficit: Secondary | ICD-10-CM | POA: Diagnosis not present

## 2018-03-29 DIAGNOSIS — R41841 Cognitive communication deficit: Secondary | ICD-10-CM | POA: Diagnosis not present

## 2018-03-29 DIAGNOSIS — M7521 Bicipital tendinitis, right shoulder: Secondary | ICD-10-CM | POA: Diagnosis not present

## 2018-03-29 DIAGNOSIS — R1312 Dysphagia, oropharyngeal phase: Secondary | ICD-10-CM | POA: Diagnosis not present

## 2018-03-29 DIAGNOSIS — M6281 Muscle weakness (generalized): Secondary | ICD-10-CM | POA: Diagnosis not present

## 2018-03-30 ENCOUNTER — Encounter: Payer: Self-pay | Admitting: Plastic Surgery

## 2018-03-30 ENCOUNTER — Ambulatory Visit (INDEPENDENT_AMBULATORY_CARE_PROVIDER_SITE_OTHER): Payer: Medicare Other | Admitting: Plastic Surgery

## 2018-03-30 VITALS — BP 104/69 | HR 77 | Temp 98.0°F

## 2018-03-30 DIAGNOSIS — S0100XA Unspecified open wound of scalp, initial encounter: Secondary | ICD-10-CM | POA: Diagnosis not present

## 2018-03-30 DIAGNOSIS — M6281 Muscle weakness (generalized): Secondary | ICD-10-CM | POA: Diagnosis not present

## 2018-03-30 DIAGNOSIS — C4402 Squamous cell carcinoma of skin of lip: Secondary | ICD-10-CM

## 2018-03-30 DIAGNOSIS — C4440 Unspecified malignant neoplasm of skin of scalp and neck: Secondary | ICD-10-CM | POA: Diagnosis not present

## 2018-03-30 DIAGNOSIS — M7521 Bicipital tendinitis, right shoulder: Secondary | ICD-10-CM | POA: Diagnosis not present

## 2018-03-30 DIAGNOSIS — F0390 Unspecified dementia without behavioral disturbance: Secondary | ICD-10-CM | POA: Diagnosis not present

## 2018-03-30 DIAGNOSIS — C001 Malignant neoplasm of external lower lip: Secondary | ICD-10-CM | POA: Diagnosis not present

## 2018-03-30 DIAGNOSIS — R41841 Cognitive communication deficit: Secondary | ICD-10-CM | POA: Diagnosis not present

## 2018-03-30 DIAGNOSIS — R1312 Dysphagia, oropharyngeal phase: Secondary | ICD-10-CM | POA: Diagnosis not present

## 2018-03-30 DIAGNOSIS — C770 Secondary and unspecified malignant neoplasm of lymph nodes of head, face and neck: Secondary | ICD-10-CM | POA: Diagnosis not present

## 2018-03-31 ENCOUNTER — Other Ambulatory Visit: Payer: Self-pay | Admitting: Otolaryngology

## 2018-03-31 DIAGNOSIS — M6281 Muscle weakness (generalized): Secondary | ICD-10-CM | POA: Diagnosis not present

## 2018-03-31 DIAGNOSIS — R1312 Dysphagia, oropharyngeal phase: Secondary | ICD-10-CM | POA: Diagnosis not present

## 2018-03-31 DIAGNOSIS — R41841 Cognitive communication deficit: Secondary | ICD-10-CM | POA: Diagnosis not present

## 2018-03-31 DIAGNOSIS — M7521 Bicipital tendinitis, right shoulder: Secondary | ICD-10-CM | POA: Diagnosis not present

## 2018-04-07 DIAGNOSIS — D649 Anemia, unspecified: Secondary | ICD-10-CM | POA: Diagnosis not present

## 2018-04-07 DIAGNOSIS — I10 Essential (primary) hypertension: Secondary | ICD-10-CM | POA: Diagnosis not present

## 2018-04-09 NOTE — Progress Notes (Signed)
   Subjective:    Patient ID: Anthony [name], male    DOB: [birth year], 78 y.o.   MRN: [id number]  The patient is a 78 year old male here with his representative assigned by the state.  He is living in a nursing facility and is again in the wheelchair today.  He has a history of dementia and depression although is pleasant and requests excision of the lesion on his lip.  Is already seen ENT.  The area is similar to last time and involves the majority of his lower lip.   Review of Systems  Constitutional: Negative.  Negative for appetite change.  HENT: Positive for mouth sores. Negative for postnasal drip.   Eyes: Negative.   Respiratory: Negative.   Cardiovascular: Negative.   Gastrointestinal: Negative.   Endocrine: Negative.   Genitourinary: Negative.   Musculoskeletal: Negative.   Psychiatric/Behavioral: Positive for confusion.       Objective:   Physical Exam Vitals signs and nursing note reviewed.  HENT:     Head: Normocephalic.     Nose: Nose normal.     Mouth/Throat:   Neck:     Musculoskeletal: No neck rigidity or muscular tenderness.  Cardiovascular:     Rate and Rhythm: Normal rate.  Pulmonary:     Effort: Pulmonary effort is normal.  Neurological:     Mental Status: He is alert.       Assessment & Plan:  Squamous cell carcinoma of lip  Dementia without behavioral disturbance, unspecified dementia type (HCC)  Open wound of scalp, unspecified open wound type, initial encounter  Plan for coordinated case with ENT.  Reconstruction will be limited.  The goal will be oral competence for voluntary independent eating.  This was expressed to the representative and the patient.

## 2018-04-10 NOTE — Pre-Procedure Instructions (Signed)
Hartman Minahan  04/10/2018      [place] [place] [facility] [place] [address] Phone: [phone] Fax: [phone]    Your procedure is scheduled on January 29th.  Report to [facility] Admitting at 8:15 A.M.  Call this number if you have problems the morning of surgery:  [phone]   Remember:  Do not eat or drink after midnight.     Take these medicines the morning of surgery with A SIP OF WATER   Tylenol - if needed  Albuterol Inhaler - if needed  Famotidine (Pepcid)  Flonase - if needed  Gabapentin (neurontin)  Memantine (Namenda)  Oxycodone - if needed  Pantoprazole (Protonix) - if needed  Miralax - if needed  Sertraline (Zoloft)   Follow your surgeon's instructions on when to stop Asprin.  If no instructions were given by your surgeon then you will need to call the office to get those instructions.    7 days prior to surgery STOP taking any Aspirin (unless otherwise instructed by your surgeon), Aleve, Naproxen, Ibuprofen, Motrin, Advil, Goody's, BC's, all herbal medications, fish oil, and all vitamins.     Do not wear jewelry.  Do not wear lotions, powders, colognes, or deodorant.  Men may shave face and neck.  Do not bring valuables to the hospital.  [facility] is not responsible for any belongings or valuables.   [facility]- Preparing For Surgery  Before surgery, you can play an important role. Because skin is not sterile, your skin needs to be as free of germs as possible. You can reduce the number of germs on your skin by washing with CHG (chlorahexidine gluconate) Soap before surgery.  CHG is an antiseptic cleaner which kills germs and bonds with the skin to continue killing germs even after washing.    Oral Hygiene is also important to reduce your risk of infection.  Remember - BRUSH YOUR TEETH THE MORNING OF SURGERY WITH YOUR REGULAR TOOTHPASTE  Please do not use if you have an allergy to CHG or  antibacterial soaps. If your skin becomes reddened/irritated stop using the CHG.  Do not shave (including legs and underarms) for at least 48 hours prior to first CHG shower. It is OK to shave your face.  Please follow these instructions carefully.   1. Shower the NIGHT BEFORE SURGERY and the MORNING OF SURGERY with CHG.   2. If you chose to wash your hair, wash your hair first as usual with your normal shampoo.  3. After you shampoo, rinse your hair and body thoroughly to remove the shampoo.  4. Use CHG as you would any other liquid soap. You can apply CHG directly to the skin and wash gently with a scrungie or a clean washcloth.   5. Apply the CHG Soap to your body ONLY FROM THE NECK DOWN.  Do not use on open wounds or open sores. Avoid contact with your eyes, ears, mouth and genitals (private parts). Wash Face and genitals (private parts)  with your normal soap.  6. Wash thoroughly, paying special attention to the area where your surgery will be performed.  7. Thoroughly rinse your body with warm water from the neck down.  8. DO NOT shower/wash with your normal soap after using and rinsing off the CHG Soap.  9. Pat yourself dry with a CLEAN TOWEL.  10. Wear CLEAN PAJAMAS to bed the night before surgery, wear comfortable clothes the morning of surgery  11. Place CLEAN SHEETS on your bed the night of your first shower and DO NOT SLEEP WITH PETS.   Day of Surgery:  Do not apply any deodorants/lotions.  Please wear clean clothes to the hospital/surgery center.   Remember to brush your teeth WITH YOUR REGULAR TOOTHPASTE.   Contacts, dentures or bridgework may not be worn into surgery.  Leave your suitcase in the car.  After surgery it may be brought to your room.  For patients admitted to the hospital, discharge time will be determined by your treatment team.  Patients discharged the day of surgery will not be allowed to drive home.    Please read over the following fact  sheets that you were given. Coughing and Deep Breathing and Surgical Site Infection Prevention

## 2018-04-11 ENCOUNTER — Encounter (HOSPITAL_COMMUNITY): Payer: Self-pay

## 2018-04-11 ENCOUNTER — Other Ambulatory Visit: Payer: Self-pay

## 2018-04-11 ENCOUNTER — Encounter (HOSPITAL_COMMUNITY)
Admission: RE | Admit: 2018-04-11 | Discharge: 2018-04-11 | Disposition: A | Discharge status: Home / Self Care | Payer: Medicare Other | Source: Ambulatory Visit | Attending: Otolaryngology | Admitting: Otolaryngology

## 2018-04-11 ENCOUNTER — Encounter (HOSPITAL_COMMUNITY): Payer: Self-pay | Admitting: Certified Registered Nurse Anesthetist

## 2018-04-11 DIAGNOSIS — Z01812 Encounter for preprocedural laboratory examination: Secondary | ICD-10-CM

## 2018-04-11 HISTORY — DX: Dysphagia, unspecified: R13.10

## 2018-04-11 HISTORY — DX: Unspecified osteoarthritis, unspecified site: M19.90

## 2018-04-11 HISTORY — DX: Personal history of urinary calculi: Z87.442

## 2018-04-11 HISTORY — DX: Headache, unspecified: R51.9

## 2018-04-11 HISTORY — DX: Malignant (primary) neoplasm, unspecified: C80.1

## 2018-04-11 HISTORY — DX: Post-traumatic stress disorder, unspecified: F43.10

## 2018-04-11 HISTORY — DX: Headache: R51

## 2018-04-11 HISTORY — DX: Essential (primary) hypertension: I10

## 2018-04-11 HISTORY — DX: Pneumonia, unspecified organism: J18.9

## 2018-04-11 LAB — BASIC METABOLIC PANEL
Anion gap: 10 (ref 5–15)
BUN: 28 mg/dL — ABNORMAL HIGH (ref 8–23)
CO2: 22 mmol/L (ref 22–32)
Calcium: 9.2 mg/dL (ref 8.9–10.3)
Chloride: 105 mmol/L (ref 98–111)
Creatinine, Ser: 1.41 mg/dL — ABNORMAL HIGH (ref 0.61–1.24)
GFR calc Af Amer: 55 mL/min — ABNORMAL LOW (ref >60)
GFR, EST NON AFRICAN AMERICAN: 47 mL/min — AB (ref >60)
Glucose, Bld: 103 mg/dL — ABNORMAL HIGH (ref 70–99)
POTASSIUM: 4.2 mmol/L (ref 3.5–5.1)
Sodium: 137 mmol/L (ref 135–145)

## 2018-04-11 LAB — CBC
HCT: 40.4 % (ref 39.0–52.0)
HEMOGLOBIN: 12.3 g/dL — AB (ref 13.0–17.0)
MCH: 27.5 pg (ref 26.0–34.0)
MCHC: 30.4 g/dL (ref 30.0–36.0)
MCV: 90.4 fL (ref 80.0–100.0)
Platelets: 258 10*3/uL (ref 150–400)
RBC: 4.47 MIL/uL (ref 4.22–5.81)
RDW: 16.9 % — ABNORMAL HIGH (ref 11.5–15.5)
WBC: 10.2 10*3/uL (ref 4.0–10.5)
nRBC: 0 % (ref 0.0–0.2)

## 2018-04-11 NOTE — Progress Notes (Addendum)
Anthony [name] is a ward of the state.Guardianship papers are in the chart and are also scanned into Epic.  Consents forms faxed to [place] and [employer] to be signed and and witnessed.  Mark ASAP as pt. Surgery is scheduled for tomorrow.  Social worker came with pt. To pre-admit appointment and was able to sign consent forms.

## 2018-04-11 NOTE — Anesthesia Preprocedure Evaluation (Addendum)
Anesthesia Evaluation  Patient identified by MRN, date of birth, ID band Patient awake    Reviewed: Allergy & Precautions, NPO status , Patient's Chart, lab work & pertinent test results  History of Anesthesia Complications Negative for: history of anesthetic complications  Airway Mallampati: II  TM Distance: >3 FB Neck ROM: Full   Comment: Large lip cancer but does not obstruct airway Dental  (+) Edentulous Upper, Edentulous Lower   Pulmonary asthma , COPD, former smoker,    Pulmonary exam normal        Cardiovascular hypertension, Normal cardiovascular exam     Neuro/Psych PSYCHIATRIC DISORDERS Anxiety Depression Dementia negative neurological ROS     GI/Hepatic Neg liver ROS, GERD  ,  Endo/Other  negative endocrine ROS  Renal/GU Renal InsufficiencyRenal disease Bladder dysfunction (BOO with suprapubic catheter)      Musculoskeletal negative musculoskeletal ROS (+)   Abdominal   Peds  Hematology  (+) anemia ,   Anesthesia Other Findings   Reproductive/Obstetrics                           Anesthesia Physical Anesthesia Plan  ASA: III  Anesthesia Plan: General   Post-op Pain Management:    Induction: Intravenous  PONV Risk Score and Plan: 2 and Ondansetron, Dexamethasone and Treatment may vary due to age or medical condition  Airway Management Planned: Oral ETT and Tracheostomy  Additional Equipment: Arterial line  Intra-op Plan:   Post-operative Plan: Post-operative intubation/ventilation  Informed Consent: I have reviewed the patients History and Physical, chart, labs and discussed the procedure including the risks, benefits and alternatives for the proposed anesthesia with the patient or authorized representative who has indicated his/her understanding and acceptance.     Consent reviewed with POA (Pt is ward of the state. His guardian was present for consent.)  Plan  Discussed with: CRNA, Anesthesiologist and Surgeon  Anesthesia Plan Comments: (Per Dr. Redmond Baseman, patient will definitely have tracheostomy.)       Anesthesia Quick Evaluation

## 2018-04-11 NOTE — Progress Notes (Addendum)
PCP - Rosita Fire MD Cardiologist -  none Chest x-ray -10-29-2017  1 view  EKG - 11-07-2017 Stress Test -denies  ECHO - 10-11-2017 Cardiac Cath - denies  Sleep Study - denies CPAP - n/a  Fasting Blood Sugar - n/a Checks Blood Sugar _n/a____ times a day  Blood Thinner Instructions:n/a Aspirin Instructions:n/a  Anesthesia review:   Patient denies shortness of breath, fever, cough and chest pain at PAT appointment   Patient verbalized understanding of instructions that were given to them at the PAT appointment. Patient was also instructed that they will need to review over the PAT instructions again at home before surgery.  Anesthesia PA-C called to see pt. Due to size of lip and possible intubation problems.  Karoline [name] PA_C to review EKG.

## 2018-04-12 ENCOUNTER — Inpatient Hospital Stay (HOSPITAL_COMMUNITY): Payer: Medicare Other | Admitting: Physician Assistant

## 2018-04-12 ENCOUNTER — Encounter (HOSPITAL_COMMUNITY)
Admission: RE | Disposition: A | Discharge status: Skilled Nursing Facility | Payer: Self-pay | Source: Skilled Nursing Facility | Attending: Otolaryngology

## 2018-04-12 ENCOUNTER — Inpatient Hospital Stay (HOSPITAL_COMMUNITY): Payer: Medicare Other

## 2018-04-12 ENCOUNTER — Other Ambulatory Visit: Payer: Self-pay

## 2018-04-12 ENCOUNTER — Encounter (HOSPITAL_COMMUNITY): Payer: Self-pay

## 2018-04-12 ENCOUNTER — Inpatient Hospital Stay (HOSPITAL_COMMUNITY)
Admission: RE | Admit: 2018-04-12 | Discharge: 2018-04-29 | DRG: 011 | Disposition: A | Discharge status: Skilled Nursing Facility | Payer: Medicare Other | Source: Skilled Nursing Facility | Attending: Otolaryngology | Admitting: Otolaryngology

## 2018-04-12 ENCOUNTER — Inpatient Hospital Stay (HOSPITAL_COMMUNITY): Payer: Medicare Other | Admitting: Certified Registered Nurse Anesthetist

## 2018-04-12 DIAGNOSIS — Z7982 Long term (current) use of aspirin: Secondary | ICD-10-CM | POA: Diagnosis not present

## 2018-04-12 DIAGNOSIS — M7521 Bicipital tendinitis, right shoulder: Secondary | ICD-10-CM | POA: Diagnosis not present

## 2018-04-12 DIAGNOSIS — R1312 Dysphagia, oropharyngeal phase: Secondary | ICD-10-CM | POA: Diagnosis not present

## 2018-04-12 DIAGNOSIS — R633 Feeding difficulties: Secondary | ICD-10-CM | POA: Diagnosis not present

## 2018-04-12 DIAGNOSIS — N390 Urinary tract infection, site not specified: Secondary | ICD-10-CM | POA: Diagnosis present

## 2018-04-12 DIAGNOSIS — K449 Diaphragmatic hernia without obstruction or gangrene: Secondary | ICD-10-CM | POA: Diagnosis present

## 2018-04-12 DIAGNOSIS — Z681 Body mass index (BMI) 19 or less, adult: Secondary | ICD-10-CM

## 2018-04-12 DIAGNOSIS — T8132XA Disruption of internal operation (surgical) wound, not elsewhere classified, initial encounter: Secondary | ICD-10-CM | POA: Diagnosis not present

## 2018-04-12 DIAGNOSIS — C770 Secondary and unspecified malignant neoplasm of lymph nodes of head, face and neck: Secondary | ICD-10-CM | POA: Diagnosis not present

## 2018-04-12 DIAGNOSIS — N401 Enlarged prostate with lower urinary tract symptoms: Secondary | ICD-10-CM | POA: Diagnosis present

## 2018-04-12 DIAGNOSIS — M6281 Muscle weakness (generalized): Secondary | ICD-10-CM | POA: Diagnosis not present

## 2018-04-12 DIAGNOSIS — F329 Major depressive disorder, single episode, unspecified: Secondary | ICD-10-CM | POA: Diagnosis present

## 2018-04-12 DIAGNOSIS — Z930 Tracheostomy status: Secondary | ICD-10-CM

## 2018-04-12 DIAGNOSIS — Z87891 Personal history of nicotine dependence: Secondary | ICD-10-CM

## 2018-04-12 DIAGNOSIS — I959 Hypotension, unspecified: Secondary | ICD-10-CM | POA: Diagnosis not present

## 2018-04-12 DIAGNOSIS — G2581 Restless legs syndrome: Secondary | ICD-10-CM | POA: Diagnosis not present

## 2018-04-12 DIAGNOSIS — E43 Unspecified severe protein-calorie malnutrition: Secondary | ICD-10-CM | POA: Diagnosis not present

## 2018-04-12 DIAGNOSIS — C001 Malignant neoplasm of external lower lip: Secondary | ICD-10-CM | POA: Diagnosis present

## 2018-04-12 DIAGNOSIS — F0390 Unspecified dementia without behavioral disturbance: Secondary | ICD-10-CM | POA: Diagnosis present

## 2018-04-12 DIAGNOSIS — D631 Anemia in chronic kidney disease: Secondary | ICD-10-CM | POA: Diagnosis not present

## 2018-04-12 DIAGNOSIS — C009 Malignant neoplasm of lip, unspecified: Secondary | ICD-10-CM | POA: Diagnosis not present

## 2018-04-12 DIAGNOSIS — Y832 Surgical operation with anastomosis, bypass or graft as the cause of abnormal reaction of the patient, or of later complication, without mention of misadventure at the time of the procedure: Secondary | ICD-10-CM | POA: Diagnosis not present

## 2018-04-12 DIAGNOSIS — E46 Unspecified protein-calorie malnutrition: Secondary | ICD-10-CM | POA: Diagnosis not present

## 2018-04-12 DIAGNOSIS — Z931 Gastrostomy status: Secondary | ICD-10-CM | POA: Diagnosis not present

## 2018-04-12 DIAGNOSIS — Z431 Encounter for attention to gastrostomy: Secondary | ICD-10-CM | POA: Diagnosis not present

## 2018-04-12 DIAGNOSIS — M79675 Pain in left toe(s): Secondary | ICD-10-CM | POA: Diagnosis not present

## 2018-04-12 DIAGNOSIS — C004 Malignant neoplasm of lower lip, inner aspect: Secondary | ICD-10-CM | POA: Diagnosis not present

## 2018-04-12 DIAGNOSIS — R911 Solitary pulmonary nodule: Secondary | ICD-10-CM | POA: Diagnosis present

## 2018-04-12 DIAGNOSIS — Z452 Encounter for adjustment and management of vascular access device: Secondary | ICD-10-CM | POA: Diagnosis not present

## 2018-04-12 DIAGNOSIS — G990 Autonomic neuropathy in diseases classified elsewhere: Secondary | ICD-10-CM | POA: Diagnosis not present

## 2018-04-12 DIAGNOSIS — J45998 Other asthma: Secondary | ICD-10-CM | POA: Diagnosis not present

## 2018-04-12 DIAGNOSIS — R1311 Dysphagia, oral phase: Secondary | ICD-10-CM | POA: Diagnosis not present

## 2018-04-12 DIAGNOSIS — N139 Obstructive and reflux uropathy, unspecified: Secondary | ICD-10-CM | POA: Diagnosis not present

## 2018-04-12 DIAGNOSIS — J449 Chronic obstructive pulmonary disease, unspecified: Secondary | ICD-10-CM | POA: Diagnosis present

## 2018-04-12 DIAGNOSIS — C7989 Secondary malignant neoplasm of other specified sites: Secondary | ICD-10-CM | POA: Diagnosis not present

## 2018-04-12 DIAGNOSIS — Z781 Physical restraint status: Secondary | ICD-10-CM

## 2018-04-12 DIAGNOSIS — I472 Ventricular tachycardia: Secondary | ICD-10-CM | POA: Diagnosis not present

## 2018-04-12 DIAGNOSIS — C4440 Unspecified malignant neoplasm of skin of scalp and neck: Secondary | ICD-10-CM | POA: Diagnosis not present

## 2018-04-12 DIAGNOSIS — Z880 Allergy status to penicillin: Secondary | ICD-10-CM

## 2018-04-12 DIAGNOSIS — G9340 Encephalopathy, unspecified: Secondary | ICD-10-CM | POA: Diagnosis not present

## 2018-04-12 DIAGNOSIS — G8929 Other chronic pain: Secondary | ICD-10-CM | POA: Diagnosis present

## 2018-04-12 DIAGNOSIS — R41841 Cognitive communication deficit: Secondary | ICD-10-CM | POA: Diagnosis not present

## 2018-04-12 DIAGNOSIS — F339 Major depressive disorder, recurrent, unspecified: Secondary | ICD-10-CM | POA: Diagnosis not present

## 2018-04-12 DIAGNOSIS — M549 Dorsalgia, unspecified: Secondary | ICD-10-CM | POA: Diagnosis present

## 2018-04-12 DIAGNOSIS — I129 Hypertensive chronic kidney disease with stage 1 through stage 4 chronic kidney disease, or unspecified chronic kidney disease: Secondary | ICD-10-CM | POA: Diagnosis not present

## 2018-04-12 DIAGNOSIS — C470 Malignant neoplasm of peripheral nerves of head, face and neck: Secondary | ICD-10-CM | POA: Diagnosis not present

## 2018-04-12 DIAGNOSIS — F0150 Vascular dementia without behavioral disturbance: Secondary | ICD-10-CM | POA: Diagnosis not present

## 2018-04-12 DIAGNOSIS — R1314 Dysphagia, pharyngoesophageal phase: Secondary | ICD-10-CM | POA: Diagnosis not present

## 2018-04-12 DIAGNOSIS — N183 Chronic kidney disease, stage 3 (moderate): Secondary | ICD-10-CM | POA: Diagnosis present

## 2018-04-12 DIAGNOSIS — G9341 Metabolic encephalopathy: Secondary | ICD-10-CM | POA: Diagnosis not present

## 2018-04-12 DIAGNOSIS — Z48817 Encounter for surgical aftercare following surgery on the skin and subcutaneous tissue: Secondary | ICD-10-CM | POA: Diagnosis not present

## 2018-04-12 DIAGNOSIS — L578 Other skin changes due to chronic exposure to nonionizing radiation: Secondary | ICD-10-CM | POA: Diagnosis not present

## 2018-04-12 DIAGNOSIS — F4312 Post-traumatic stress disorder, chronic: Secondary | ICD-10-CM | POA: Diagnosis not present

## 2018-04-12 DIAGNOSIS — T8131XA Disruption of external operation (surgical) wound, not elsewhere classified, initial encounter: Secondary | ICD-10-CM | POA: Diagnosis not present

## 2018-04-12 DIAGNOSIS — Z430 Encounter for attention to tracheostomy: Secondary | ICD-10-CM | POA: Diagnosis not present

## 2018-04-12 DIAGNOSIS — M2550 Pain in unspecified joint: Secondary | ICD-10-CM | POA: Diagnosis not present

## 2018-04-12 DIAGNOSIS — J45909 Unspecified asthma, uncomplicated: Secondary | ICD-10-CM | POA: Diagnosis not present

## 2018-04-12 DIAGNOSIS — N320 Bladder-neck obstruction: Secondary | ICD-10-CM | POA: Diagnosis present

## 2018-04-12 DIAGNOSIS — B351 Tinea unguium: Secondary | ICD-10-CM | POA: Diagnosis not present

## 2018-04-12 DIAGNOSIS — I10 Essential (primary) hypertension: Secondary | ICD-10-CM | POA: Diagnosis not present

## 2018-04-12 DIAGNOSIS — F419 Anxiety disorder, unspecified: Secondary | ICD-10-CM | POA: Diagnosis not present

## 2018-04-12 DIAGNOSIS — K219 Gastro-esophageal reflux disease without esophagitis: Secondary | ICD-10-CM | POA: Diagnosis present

## 2018-04-12 DIAGNOSIS — J690 Pneumonitis due to inhalation of food and vomit: Secondary | ICD-10-CM | POA: Diagnosis not present

## 2018-04-12 DIAGNOSIS — Z7401 Bed confinement status: Secondary | ICD-10-CM | POA: Diagnosis not present

## 2018-04-12 DIAGNOSIS — J439 Emphysema, unspecified: Secondary | ICD-10-CM | POA: Diagnosis not present

## 2018-04-12 DIAGNOSIS — R278 Other lack of coordination: Secondary | ICD-10-CM | POA: Diagnosis not present

## 2018-04-12 DIAGNOSIS — C4402 Squamous cell carcinoma of skin of lip: Secondary | ICD-10-CM | POA: Diagnosis not present

## 2018-04-12 DIAGNOSIS — C4442 Squamous cell carcinoma of skin of scalp and neck: Secondary | ICD-10-CM | POA: Diagnosis not present

## 2018-04-12 DIAGNOSIS — R498 Other voice and resonance disorders: Secondary | ICD-10-CM | POA: Diagnosis not present

## 2018-04-12 DIAGNOSIS — Z4682 Encounter for fitting and adjustment of non-vascular catheter: Secondary | ICD-10-CM | POA: Diagnosis not present

## 2018-04-12 DIAGNOSIS — D649 Anemia, unspecified: Secondary | ICD-10-CM | POA: Diagnosis not present

## 2018-04-12 HISTORY — PX: LESION EXCISION WITH COMPLEX REPAIR: SHX6700

## 2018-04-12 HISTORY — PX: TRACHEOSTOMY TUBE PLACEMENT: SHX814

## 2018-04-12 HISTORY — PX: RADICAL NECK DISSECTION: SHX2284

## 2018-04-12 LAB — POCT I-STAT 7, (LYTES, BLD GAS, ICA,H+H)
Acid-base deficit: 9 mmol/L — ABNORMAL HIGH (ref 0.0–2.0)
Acid-base deficit: 2 mmol/L (ref 0.0–2.0)
Bicarbonate: 16.8 mmol/L — ABNORMAL LOW (ref 20.0–28.0)
Bicarbonate: 24.3 mmol/L (ref 20.0–28.0)
Calcium, Ion: 1.05 mmol/L — ABNORMAL LOW (ref 1.15–1.40)
Calcium, Ion: 1.29 mmol/L (ref 1.15–1.40)
HCT: 25 % — ABNORMAL LOW (ref 39.0–52.0)
HEMATOCRIT: 32 % — AB (ref 39.0–52.0)
Hemoglobin: 8.5 g/dL — ABNORMAL LOW (ref 13.0–17.0)
Hemoglobin: 10.9 g/dL — ABNORMAL LOW (ref 13.0–17.0)
O2 Saturation: 100 %
O2 Saturation: 100 %
POTASSIUM: 3.7 mmol/L (ref 3.5–5.1)
Potassium: 2.6 mmol/L — CL (ref 3.5–5.1)
Sodium: 143 mmol/L (ref 135–145)
Sodium: 140 mmol/L (ref 135–145)
TCO2: 18 mmol/L — ABNORMAL LOW (ref 22–32)
TCO2: 26 mmol/L (ref 22–32)
pCO2 arterial: 36.1 mmHg (ref 32.0–48.0)
pCO2 arterial: 46.7 mmHg (ref 32.0–48.0)
pH, Arterial: 7.276 — ABNORMAL LOW (ref 7.350–7.450)
pH, Arterial: 7.324 — ABNORMAL LOW (ref 7.350–7.450)
pO2, Arterial: 192 mmHg — ABNORMAL HIGH (ref 83.0–108.0)
pO2, Arterial: 351 mmHg — ABNORMAL HIGH (ref 83.0–108.0)

## 2018-04-12 LAB — POCT ACTIVATED CLOTTING TIME: Activated Clotting Time: 0 seconds

## 2018-04-12 SURGERY — LESION EXCISION WITH COMPLEX REPAIR
Anesthesia: General | Site: Neck

## 2018-04-12 MED ORDER — WHITE PETROLATUM EX OINT
TOPICAL_OINTMENT | Freq: Two times a day (BID) | CUTANEOUS | Status: DC
  Administered 2018-04-12: 1 via TOPICAL
  Administered 2018-04-13 – 2018-04-18 (×11): 0.2 via TOPICAL
  Administered 2018-04-18: 22:00:00 via TOPICAL
  Administered 2018-04-19: 0.2 via TOPICAL
  Administered 2018-04-19: 1 via TOPICAL
  Administered 2018-04-20: 0.2 via TOPICAL
  Administered 2018-04-20 – 2018-04-21 (×3): via TOPICAL
  Administered 2018-04-22: 0.2 via TOPICAL
  Administered 2018-04-22: 10:00:00 via TOPICAL
  Administered 2018-04-23: 0.2 via TOPICAL
  Administered 2018-04-23 – 2018-04-24 (×2): via TOPICAL
  Administered 2018-04-25 (×2): 0.2 via TOPICAL
  Administered 2018-04-25: 1 via TOPICAL
  Administered 2018-04-26: 22:00:00 via TOPICAL
  Administered 2018-04-26: 0.2 via TOPICAL
  Administered 2018-04-27 – 2018-04-28 (×4): via TOPICAL
  Administered 2018-04-29: 0.2 via TOPICAL
  Filled 2018-04-12 (×11): qty 28.35

## 2018-04-12 MED ORDER — FENTANYL CITRATE (PF) 100 MCG/2ML IJ SOLN
INTRAMUSCULAR | Status: DC | PRN
  Administered 2018-04-12 (×2): 50 ug via INTRAVENOUS
  Administered 2018-04-12 (×2): 100 ug via INTRAVENOUS
  Administered 2018-04-12: 25 ug via INTRAVENOUS
  Administered 2018-04-12 (×3): 50 ug via INTRAVENOUS
  Administered 2018-04-12: 25 ug via INTRAVENOUS

## 2018-04-12 MED ORDER — OXYCODONE HCL 5 MG PO TABS: 5.0000 mg | ORAL_TABLET | Freq: Once | ORAL | Status: DC | PRN

## 2018-04-12 MED ORDER — SUGAMMADEX SODIUM 200 MG/2ML IV SOLN
INTRAVENOUS | Status: DC | PRN
  Administered 2018-04-12: 150 mg via INTRAVENOUS

## 2018-04-12 MED ORDER — BACITRACIN ZINC 500 UNIT/GM EX OINT
TOPICAL_OINTMENT | CUTANEOUS | Status: AC
  Filled 2018-04-12: qty 28.35

## 2018-04-12 MED ORDER — LIDOCAINE 2% (20 MG/ML) 5 ML SYRINGE
INTRAMUSCULAR | Status: AC
  Filled 2018-04-12: qty 5

## 2018-04-12 MED ORDER — MIDAZOLAM HCL 5 MG/5ML IJ SOLN
INTRAMUSCULAR | Status: DC | PRN
  Administered 2018-04-12: 2 mg via INTRAVENOUS

## 2018-04-12 MED ORDER — CHLORHEXIDINE GLUCONATE CLOTH 2 % EX PADS: 6.0000 | MEDICATED_PAD | Freq: Once | CUTANEOUS | Status: DC

## 2018-04-12 MED ORDER — DEXAMETHASONE SODIUM PHOSPHATE 10 MG/ML IJ SOLN
INTRAMUSCULAR | Status: AC
  Filled 2018-04-12: qty 1

## 2018-04-12 MED ORDER — LIDOCAINE HCL (CARDIAC) PF 100 MG/5ML IV SOSY
PREFILLED_SYRINGE | INTRAVENOUS | Status: DC | PRN
  Administered 2018-04-12: 100 mg via INTRAVENOUS

## 2018-04-12 MED ORDER — LIDOCAINE-EPINEPHRINE 1 %-1:100000 IJ SOLN
INTRAMUSCULAR | Status: DC | PRN
  Administered 2018-04-12: 20 mL
  Administered 2018-04-12: 10 mL

## 2018-04-12 MED ORDER — LACTATED RINGERS IV SOLN
INTRAVENOUS | Status: DC
  Administered 2018-04-12 (×4): via INTRAVENOUS

## 2018-04-12 MED ORDER — MIDAZOLAM HCL 2 MG/2ML IJ SOLN
INTRAMUSCULAR | Status: AC
  Filled 2018-04-12: qty 2

## 2018-04-12 MED ORDER — BACITRACIN ZINC 500 UNIT/GM EX OINT
TOPICAL_OINTMENT | CUTANEOUS | Status: DC | PRN
  Administered 2018-04-12: 1 via TOPICAL

## 2018-04-12 MED ORDER — ONDANSETRON HCL 4 MG/2ML IJ SOLN
4.0000 mg | INTRAMUSCULAR | Status: DC | PRN
  Administered 2018-04-21: 4 mg via INTRAVENOUS

## 2018-04-12 MED ORDER — LIDOCAINE-EPINEPHRINE 1 %-1:100000 IJ SOLN
INTRAMUSCULAR | Status: AC
  Filled 2018-04-12: qty 1

## 2018-04-12 MED ORDER — ONDANSETRON HCL 4 MG/2ML IJ SOLN: 4.0000 mg | Freq: Once | INTRAMUSCULAR | Status: DC | PRN

## 2018-04-12 MED ORDER — DEXAMETHASONE SODIUM PHOSPHATE 10 MG/ML IJ SOLN
INTRAMUSCULAR | Status: DC | PRN
  Administered 2018-04-12: 10 mg via INTRAVENOUS

## 2018-04-12 MED ORDER — PROPOFOL 10 MG/ML IV BOLUS
INTRAVENOUS | Status: DC | PRN
  Administered 2018-04-12: 120 mg via INTRAVENOUS

## 2018-04-12 MED ORDER — FENTANYL CITRATE (PF) 250 MCG/5ML IJ SOLN
INTRAMUSCULAR | Status: AC
  Filled 2018-04-12: qty 10

## 2018-04-12 MED ORDER — CIPROFLOXACIN IN D5W 400 MG/200ML IV SOLN
INTRAVENOUS | Status: AC
  Filled 2018-04-12: qty 200

## 2018-04-12 MED ORDER — MORPHINE SULFATE (PF) 2 MG/ML IV SOLN
2.0000 mg | INTRAVENOUS | Status: DC | PRN
  Administered 2018-04-13 – 2018-04-19 (×19): 2 mg via INTRAVENOUS
  Administered 2018-04-20 – 2018-04-21 (×4): 4 mg via INTRAVENOUS
  Administered 2018-04-21: 2 mg via INTRAVENOUS
  Administered 2018-04-21 – 2018-04-22 (×4): 4 mg via INTRAVENOUS
  Administered 2018-04-23 (×4): 2 mg via INTRAVENOUS
  Administered 2018-04-24: 4 mg via INTRAVENOUS
  Administered 2018-04-24: 2 mg via INTRAVENOUS
  Administered 2018-04-24: 4 mg via INTRAVENOUS
  Administered 2018-04-25 – 2018-04-28 (×9): 2 mg via INTRAVENOUS
  Administered 2018-04-28: 4 mg via INTRAVENOUS
  Administered 2018-04-29: 2 mg via INTRAVENOUS
  Filled 2018-04-12 (×5): qty 1
  Filled 2018-04-12: qty 2
  Filled 2018-04-12 (×2): qty 1
  Filled 2018-04-12 (×2): qty 2
  Filled 2018-04-12: qty 1
  Filled 2018-04-12: qty 2
  Filled 2018-04-12: qty 1
  Filled 2018-04-12: qty 2
  Filled 2018-04-12: qty 1
  Filled 2018-04-12 (×2): qty 2
  Filled 2018-04-12 (×3): qty 1
  Filled 2018-04-12 (×3): qty 2
  Filled 2018-04-12 (×4): qty 1
  Filled 2018-04-12: qty 2
  Filled 2018-04-12 (×9): qty 1
  Filled 2018-04-12: qty 2
  Filled 2018-04-12 (×3): qty 1
  Filled 2018-04-12: qty 2
  Filled 2018-04-12 (×2): qty 1
  Filled 2018-04-12: qty 2
  Filled 2018-04-12 (×4): qty 1

## 2018-04-12 MED ORDER — ONDANSETRON HCL 4 MG/2ML IJ SOLN
INTRAMUSCULAR | Status: AC
  Filled 2018-04-12: qty 2

## 2018-04-12 MED ORDER — PROPOFOL 10 MG/ML IV BOLUS
INTRAVENOUS | Status: AC
  Filled 2018-04-12: qty 20

## 2018-04-12 MED ORDER — ONDANSETRON HCL 4 MG/2ML IJ SOLN
INTRAMUSCULAR | Status: DC | PRN
  Administered 2018-04-12: 4 mg via INTRAVENOUS

## 2018-04-12 MED ORDER — ROCURONIUM BROMIDE 50 MG/5ML IV SOSY
PREFILLED_SYRINGE | INTRAVENOUS | Status: AC
  Filled 2018-04-12: qty 5

## 2018-04-12 MED ORDER — OXYCODONE HCL 5 MG/5ML PO SOLN: 5.0000 mg | Freq: Once | ORAL | Status: DC | PRN

## 2018-04-12 MED ORDER — FENTANYL CITRATE (PF) 100 MCG/2ML IJ SOLN
25.0000 ug | INTRAMUSCULAR | Status: DC | PRN
  Administered 2018-04-12: 25 ug via INTRAVENOUS

## 2018-04-12 MED ORDER — SUGAMMADEX SODIUM 500 MG/5ML IV SOLN
INTRAVENOUS | Status: AC
  Filled 2018-04-12: qty 10

## 2018-04-12 MED ORDER — ROCURONIUM BROMIDE 100 MG/10ML IV SOLN
INTRAVENOUS | Status: DC | PRN
  Administered 2018-04-12 (×2): 50 mg via INTRAVENOUS

## 2018-04-12 MED ORDER — KCL IN DEXTROSE-NACL 20-5-0.45 MEQ/L-%-% IV SOLN
INTRAVENOUS | Status: DC
  Administered 2018-04-12 – 2018-04-21 (×16): via INTRAVENOUS
  Filled 2018-04-12 (×17): qty 1000

## 2018-04-12 MED ORDER — CIPROFLOXACIN IN D5W 400 MG/200ML IV SOLN
400.0000 mg | INTRAVENOUS | Status: AC
  Administered 2018-04-12: 400 mg via INTRAVENOUS

## 2018-04-12 MED ORDER — SODIUM CHLORIDE 0.9 % IV SOLN
INTRAVENOUS | Status: DC | PRN
  Administered 2018-04-12: 40 ug/min via INTRAVENOUS

## 2018-04-12 MED ORDER — LACTATED RINGERS IV SOLN
INTRAVENOUS | Status: DC | PRN
  Administered 2018-04-12 (×2): via INTRAVENOUS

## 2018-04-12 MED ORDER — FENTANYL CITRATE (PF) 100 MCG/2ML IJ SOLN
INTRAMUSCULAR | Status: AC
  Filled 2018-04-12: qty 2

## 2018-04-12 SURGICAL SUPPLY — 73 items
BLADE SURG 15 STRL LF DISP TIS (BLADE) IMPLANT
BLADE SURG 15 STRL SS (BLADE) ×2
CANISTER SUCT 3000ML PPV (MISCELLANEOUS) ×5 IMPLANT
CATH ROBINSON RED A/P 10FR (CATHETERS) ×2 IMPLANT
CLEANER TIP ELECTROSURG 2X2 (MISCELLANEOUS) ×5 IMPLANT
CORDS BIPOLAR (ELECTRODE) ×5 IMPLANT
COVER SURGICAL LIGHT HANDLE (MISCELLANEOUS) ×7 IMPLANT
COVER WAND RF STERILE (DRAPES) ×5 IMPLANT
CRADLE DONUT ADULT HEAD (MISCELLANEOUS) IMPLANT
DECANTER SPIKE VIAL GLASS SM (MISCELLANEOUS) ×5 IMPLANT
DRAIN SNY 10 ROU (WOUND CARE) IMPLANT
DRAIN WOUND SNY 15 RND (WOUND CARE) IMPLANT
DRAPE HALF SHEET 40X57 (DRAPES) ×2 IMPLANT
DRAPE ORTHO SPLIT 77X108 STRL (DRAPES) ×4
DRAPE SURG ORHT 6 SPLT 77X108 (DRAPES) IMPLANT
ELECT CAUTERY BLADE 6.4 (BLADE) ×2 IMPLANT
ELECT COATED BLADE 2.86 ST (ELECTRODE) ×5 IMPLANT
ELECT NDL BLADE 2-5/6 (NEEDLE) IMPLANT
ELECT NEEDLE BLADE 2-5/6 (NEEDLE) ×5 IMPLANT
ELECT REM PT RETURN 9FT ADLT (ELECTROSURGICAL) ×5
ELECTRODE REM PT RTRN 9FT ADLT (ELECTROSURGICAL) ×3 IMPLANT
EVACUATOR SILICONE 100CC (DRAIN) ×7 IMPLANT
FORCEPS BIPOLAR SPETZLER 8 1.0 (NEUROSURGERY SUPPLIES) ×5 IMPLANT
GAUZE 4X4 16PLY RFD (DISPOSABLE) ×9 IMPLANT
GAUZE PETROLATUM 1 X8 (GAUZE/BANDAGES/DRESSINGS) IMPLANT
GLOVE BIO SURGEON STRL SZ7.5 (GLOVE) ×7 IMPLANT
GOWN STRL REUS W/ TWL LRG LVL3 (GOWN DISPOSABLE) ×6 IMPLANT
GOWN STRL REUS W/TWL LRG LVL3 (GOWN DISPOSABLE) ×10
HOLDER TRACH TUBE VELCRO 19.5 (MISCELLANEOUS) ×5 IMPLANT
KIT BASIN OR (CUSTOM PROCEDURE TRAY) ×5 IMPLANT
KIT TURNOVER KIT B (KITS) ×5 IMPLANT
LOCATOR NERVE 3 VOLT (DISPOSABLE) ×2 IMPLANT
NDL HYPO 25GX1X1/2 BEV (NEEDLE) ×3 IMPLANT
NEEDLE HYPO 25GX1X1/2 BEV (NEEDLE) ×10 IMPLANT
NS IRRIG 1000ML POUR BTL (IV SOLUTION) ×5 IMPLANT
PACK EENT II TURBAN DRAPE (CUSTOM PROCEDURE TRAY) ×5 IMPLANT
PAD ARMBOARD 7.5X6 YLW CONV (MISCELLANEOUS) ×10 IMPLANT
PENCIL BUTTON HOLSTER BLD 10FT (ELECTRODE) ×7 IMPLANT
SPECIMEN JAR MEDIUM (MISCELLANEOUS) ×5 IMPLANT
SPONGE DRAIN TRACH 4X4 STRL 2S (GAUZE/BANDAGES/DRESSINGS) ×5 IMPLANT
SPONGE INTESTINAL PEANUT (DISPOSABLE) ×2 IMPLANT
SPONGE LAP 18X18 X RAY DECT (DISPOSABLE) ×3 IMPLANT
STAPLER VISISTAT 35W (STAPLE) ×7 IMPLANT
SUT ETHILON 2 0 FS 18 (SUTURE) ×4 IMPLANT
SUT ETHILON 3 0 PS 1 (SUTURE) ×3 IMPLANT
SUT MNCRL AB 3-0 PS2 18 (SUTURE) ×2 IMPLANT
SUT MON AB 5-0 PS2 18 (SUTURE) ×10 IMPLANT
SUT SILK 0 FSL (SUTURE) ×3 IMPLANT
SUT SILK 2 0 REEL (SUTURE) ×3 IMPLANT
SUT SILK 2 0 SH CR/8 (SUTURE) ×9 IMPLANT
SUT SILK 3 0 REEL (SUTURE) ×9 IMPLANT
SUT SILK 4 0 REEL (SUTURE) ×5 IMPLANT
SUT VIC AB 2-0 FS1 27 (SUTURE) IMPLANT
SUT VIC AB 3-0 PS2 18 (SUTURE) ×4 IMPLANT
SUT VIC AB 3-0 SH 27 (SUTURE) ×2
SUT VIC AB 3-0 SH 27X BRD (SUTURE) ×3 IMPLANT
SUT VIC AB 4-0 PS2 27 (SUTURE) ×10 IMPLANT
SUT VIC AB 5-0 PS2 18 (SUTURE) ×6 IMPLANT
SUT VICRYL 3 0 (SUTURE) IMPLANT
SUT VICRYL 4-0 PS2 18IN ABS (SUTURE) ×8 IMPLANT
SYR 20ML ECCENTRIC (SYRINGE) ×5 IMPLANT
SYR BULB 3OZ (MISCELLANEOUS) ×5 IMPLANT
SYR CONTROL 10ML LL (SYRINGE) ×7 IMPLANT
TOWEL OR 17X24 6PK STRL BLUE (TOWEL DISPOSABLE) ×5 IMPLANT
TRAY ENT MC OR (CUSTOM PROCEDURE TRAY) ×5 IMPLANT
TRAY FOLEY MTR SLVR 14FR STAT (SET/KITS/TRAYS/PACK) IMPLANT
TUBE CONNECTING 12'X1/4 (SUCTIONS) ×1
TUBE CONNECTING 12X1/4 (SUCTIONS) ×4 IMPLANT
TUBE FEEDING 10FR FLEXIFLO (MISCELLANEOUS) IMPLANT
TUBE SALEM SUMP 10F W/ARV (TUBING) ×2 IMPLANT
TUBE TRACH SHILEY  6 DIST  CUF (TUBING) ×2 IMPLANT
UNDERPAD 30X30 (UNDERPADS AND DIAPERS) ×5 IMPLANT
WATER STERILE IRR 1000ML POUR (IV SOLUTION) ×5 IMPLANT

## 2018-04-12 NOTE — Brief Op Note (Signed)
04/12/2018  3:50 PM  PATIENT:  Anthony [name]  78 y.o. male  PRE-OPERATIVE DIAGNOSIS:  cancer of external lower lip  POST-OPERATIVE DIAGNOSIS:  cancer of external lower lip  PROCEDURE:  Procedure(s): TOTAL LOWER LIP RESECTION (N/A) TRACHEOSTOMY (N/A) LOWER LIP RECONSTRUCTION WITH TISSUE REARRANGMENT OR REGIONAL FLAP (N/A) Bilateral Radical Neck Dissection (Bilateral)  SURGEON:  Surgeon(s) and Role: Panel 1:    Melida Quitter, MD - Primary Panel 2:    * Dillingham, Loel Lofty, DO - Primary  PHYSICIAN ASSISTANT:   ASSISTANTS: none   ANESTHESIA:   general  EBL: 200 cc  BLOOD ADMINISTERED:none  DRAINS: (15 Fr) Jackson-Pratt drain(s) with closed bulb suction in the bilateral neck   LOCAL MEDICATIONS USED:  LIDOCAINE   SPECIMEN:  Source of Specimen:  Lower lip, left and right neck contents zones 1, 2a, and 3  DISPOSITION OF SPECIMEN:  PATHOLOGY  COUNTS:  YES  TOURNIQUET:  * No tourniquets in log *  DICTATION: .Other Dictation: Dictation Number [id number]  PLAN OF CARE: Admit to inpatient   PATIENT DISPOSITION:  PACU - hemodynamically stable.   Delay start of Pharmacological VTE agent (>24hrs) due to surgical blood loss or risk of bleeding: yes

## 2018-04-12 NOTE — Anesthesia Procedure Notes (Signed)
Arterial Line Insertion Start/End1/29/2020 11:10 AM, 04/12/2018 11:40 AM Performed by: Lidia Collum, MD, anesthesiologist  Patient location: OR. Preanesthetic checklist: patient identified, IV checked, risks and benefits discussed, surgical consent, monitors and equipment checked, pre-op evaluation and timeout performed Patient sedated Left, radial was placed Catheter size: 20 G Hand hygiene performed  and maximum sterile barriers used   Attempts: 4 Procedure performed using ultrasound guided technique. Ultrasound Notes:anatomy identified, needle tip was noted to be adjacent to the nerve/plexus identified and no ultrasound evidence of intravascular and/or intraneural injection Following insertion, dressing applied and Biopatch. Post procedure assessment: normal  Patient tolerated the procedure well with no immediate complications.

## 2018-04-12 NOTE — Op Note (Signed)
NAME: KAYDIN, KARBOWSKI MEDICAL RECORD JO:[id number] ACCOUNT [id number] DATE OF BIRTH:[birth year] FACILITY: [facility] LOCATION: [facility] PHYSICIAN:Shanisha Lech Guido Sander, MD  OPERATIVE REPORT  DATE OF PROCEDURE:  04/12/2018  PREOPERATIVE DIAGNOSIS:  Lower lip cancer with cervical metastases.  POSTOPERATIVE DIAGNOSIS:  Lower lip cancer with cervical metastases.  PROCEDURE: 1.  Total lower lip resection. 2.  Bilateral selective neck dissections zones 1, 2A, and 3.  Tracheostomy.  SURGEON:  Melida Quitter, MD  ANESTHESIA:  General endotracheal anesthesia.  COMPLICATIONS:  None.  INDICATIONS:  The patient is a 78 year old male who has had a sizable tumor grow on the lower lip over the last several months that is biopsy-proven squamous cell carcinoma.  CT imaging demonstrates enlarged lymph nodes in both upper necks, and he  presents to the operating room for surgical management.  FINDINGS:  The exophytic mass involves just about the entire lower lip down to the mental crease towards the right side and nearly down to the gingival buccal sulcus internally.  The tumor was removed, and margins were all negative except for a mental  nerve margin on the right side.  Additional mental foramen tissue was sent as a new margin and was negative.  In the necks, there were scattered small enlarged lymph nodes on both sides with the largest on the right in zone 1 adjacent to the mandible and  adherent to the musculature.  There was also a smaller but enlarged node in the left zone 2.  DESCRIPTION OF PROCEDURE:  The patient was identified in the holding room, informed consent having been obtained including discussion of risks, benefits and alternatives.  The patient was brought to the operative suite and put the operative table in  supine position.  Anesthesia was induced, and the patient was intubated by the anesthesia team without difficulty.  The patient was given intravenous antibiotics during the case.  The eyes  were lubricated, and the neck was prepped and draped in sterile  fashion.  The tracheostomy incision was marked with a marking pen and injected with 1% lidocaine with 1:100,000 epinephrine.  A skin incision was made with a 15-blade scalpel, and subcutaneous tissues were divided using Bovie electrocautery down to the  raphae of the strap muscles, which was also divided.  The thyroid isthmus was divided in the midline, exposing the anterior tracheal wall.  A horizontal incision was made between rings 2 and 3 of the trachea and extended laterally with scissors.  Stay  sutures of 2-0 silk were placed around the ring above the ring below the trach site.  The endotracheal tube was backed out to above the trach site, and a #6 cuffed Shiley trach tube was placed and cuff inflated.  Inner cannula and anesthesia circuit were  attached.  The patient was successfully ventilated.  The stay sutures were tied with 1 knot below and 2 knots above the trach site.  The trach flange was sutured into the skin using 2-0 silk suture in 4 quadrants.  At this point, drapes were removed.   The patient was cleaned off.  The patient was repositioned for the following portions of the case.  A marking pen was used to mark the incision around the lip lesion with about a 1 cm margin.  The apron incision for the neck was also marked, and both  sites were injected with 1% lidocaine with 1:100,000 epinephrine.  The face and neck were prepped and draped in sterile fashion.  The skin was incised around the lip lesion using  a 15-blade scalpel.  This was also then extended onto the mucosa of the lip  and into the undersurface of the lip around the lesion with about a 1 cm margin.  The subcutaneous tissues and musculature were then divided using Bovie electrocautery including ligature of the labial nerve on the left side.  Further dissection was then  performed until the lesion was removed including following a firm extension, which represented  the mental nerve back toward the mental foramen.  The lesion was oriented with suture and passed to nursing for pathology.  Margins were then obtained from  the mucosa, skin, and the mental nerve.  Later in the case, results returned with negative margins in all locations except the mental nerve.  At that time, additional mental foramen tissue was then isolated with a 15-blade scalpel and fully removed out  of the mental foramen and passed to nursing as a new margin that came back negative.  Bleeding was controlled with bipolar electrocautery.  At this point, Dr. Marla Roe began to work on the lower lip.  Her reconstruction is covered in her operative note  separately.  The apron incision in the neck was made with a 15-blade scalpel starting on the left side, and the subcutaneous tissue and platysma muscle were divided with Bovie electrocautery.  A subplatysmal flap was elevated superiorly toward the  margin of the mandible using a 15-blade scalpel and bipolar cautery.  An inferior subplatysmal flap was also elevated.  Stay sutures were placed to hold the flaps.  At this point, the sternocleidomastoid muscle was skeletonized, and this allowed  identification of the spinal accessory nerve superiorly, which was the superior margin of the dissection.  Inferiorly, the omohyoid muscle was identified and served as the inferior extent of the dissection.  Soft tissues were then elevated off of the  deep cervical fascia and cervical plexus and elevated then over the great vessels, which were skeletonized.  The digastric muscle was dissected anteriorly.  Soft tissues were fully dissected off the deep structures up to the zone 1 region where the  submandibular gland was then dissected over the superior margin.  This allowed exposure of the mylohyoid muscle anteriorly, which was retracted anteriorly.  The vascular nerve connections to the submandibular gland were then divided and ligated.  The  gland was then moved  inferiorly along with the neck contents, also removing fatty tissue from the submental region until the entire specimen was removed.  The specimen was marked for orientation and passed to nursing for pathology.  The same procedure  was then carried out on the right side without exception including elevation of subplatysmal flaps, skeletonization of the sternocleidomastoid muscle, dissection of the tissues off the deep cervical fascia, and skeletonization of the great vessels,  identification and skeletonization of the spinal accessory nerve and omohyoid muscles and dissection of the tissues out of zone 1 including connections with the submandibular gland.  The difference on the right side involved dissecting the mass from the  mandibular margin which required a Bovie electrocautery dissection through the musculature, allowing the mass to be removed from the deep soft tissues and kept attached to the neck contents.  These contents were also marked for orientation and passed to  nursing for pathology.  At this point, Dr. Marla Roe had finished her portion of the case.  The necks were copiously irrigated with saline and bleeding controlled with bipolar electrocautery.  A 15-French round drain was placed in each side of the neck  through a separate stab incision.  These were secured with 2-0 nylon suture and standard drain stitch on both sides.  The flaps were released and laid down and closed in the platysmal layer using 3-0 Vicryl suture in a simple running fashion and staples  in the skin.  After this, the drapes were removed and the patient was cleaned off.  The drains were placed to bulb suction and taped to each shoulder.  A Velcro trach tie and trach dressing were added.  Of note, at the beginning of the case before  proceeding with a tracheostomy, a small nasogastric tube was placed through the right nasal passage, and placement positioning was confirmed by auscultating over the stomach with air  insufflated through the tube.  This was placed after first placing a  vomer tie, and the vomer tie was then tied to the nasogastric tube.  With the case finished, the patient was returned to anesthesia for wakeup and was moved to recovery room in stable condition.  LN/NUANCE  D:04/12/2018 T:04/12/2018 JOB:[id number]

## 2018-04-12 NOTE — Anesthesia Procedure Notes (Signed)
Procedure Name: Intubation Date/Time: 04/12/2018 10:45 AM Performed by: Laverne Hursey T, CRNA Pre-anesthesia Checklist: Patient identified, Emergency Drugs available, Suction available and Patient being monitored Patient Re-evaluated:Patient Re-evaluated prior to induction Oxygen Delivery Method: Circle system utilized Preoxygenation: Pre-oxygenation with 100% oxygen Induction Type: IV induction Ventilation: Mask ventilation without difficulty Laryngoscope Size: Miller and 3 Grade View: Grade I Tube type: Oral Tube size: 7.5 mm Number of attempts: 1 Airway Equipment and Method: Patient positioned with wedge pillow and Stylet Placement Confirmation: ETT inserted through vocal cords under direct vision,  positive ETCO2 and breath sounds checked- equal and bilateral Secured at: 22 cm Tube secured with: Tape Dental Injury: Teeth and Oropharynx as per pre-operative assessment

## 2018-04-12 NOTE — H&P (Signed)
Anthony [name] is an 78 y.o. male.   Chief Complaint: Lower lip cancer HPI: 78 year old male with a few months of enlarging mass on the lower lip that is affecting ability to eat.  Biopsy demonstrated squamous cell cancer.  Imaging demonstrated enlarged nodes in both superior necks.  He also has a sizeable tumor on the right vertex of the scalp that had been removed twice previously.  He presents today for management of the lower lip cancer.  Past Medical History:  Diagnosis Date  . Acute pyelonephritis 12/23/2014  . Arthritis   . Asthma   . BPH (benign prostatic hyperplasia)   . Cancer ([place])    Lip cancer,has a place on the top of head  . Chronic back pain   . Chronic hip pain   . CKD (chronic kidney disease)   . COPD (chronic obstructive pulmonary disease) ([place])   . Dementia ([place])   . Depression   . Dysphagia   . GERD (gastroesophageal reflux disease)   . Headache   . History of kidney stones   . Hypertension    no longer on medication  . Kidney stone   . Neuropathy   . Pneumonia    november 2019  bilateral  . PTSD (post-traumatic stress disorder)   . Pulmonary nodules 12/25/2014  . Restless leg syndrome     Past Surgical History:  Procedure Laterality Date  . ABDOMINAL SURGERY     heria  . ESOPHAGOGASTRODUODENOSCOPY N/A 07/06/2017   Procedure: ESOPHAGOGASTRODUODENOSCOPY (EGD);  Surgeon: Rogene Houston, MD;  Location: [facility];  Service: Endoscopy;  Laterality: N/A;  . EYE SURGERY    . FRACTURE SURGERY  2008   hip fracture with nailing  . HERNIA REPAIR    . IR CATHETER TUBE CHANGE  01/26/2018  . supubic catheter      Family History  Family history unknown: Yes   Social History:  reports that he quit smoking about 10 months ago. His smoking use included cigarettes. He smoked 0.50 packs per day. He has never used smokeless tobacco. He reports that he does not drink alcohol or use drugs.  Allergies: Penicillin   Medications Prior to Admission  Medication  Sig Dispense Refill  . acetaminophen (TYLENOL) 500 MG tablet Take 500 mg by mouth every 6 (six) hours as needed (pain).     Marland Kitchen aspirin EC 81 MG tablet Take 81 mg by mouth daily. (0900)    . buPROPion (WELLBUTRIN) 100 MG tablet Take 100 mg by mouth daily. (0900)    . famotidine (PEPCID) 10 MG tablet Take 40 mg by mouth daily. (0900)    . fluticasone (FLONASE) 50 MCG/ACT nasal spray Place 1 spray into both nostrils 2 (two) times daily. (0900 & 2100)    . gabapentin (NEURONTIN) 100 MG capsule Take 200 mg by mouth 3 (three) times daily. (0900, 1700, & 2100)    . Lidocaine 2 % GEL Apply 1 application topically See admin instructions. Apply to lips/mouth topically before meals    . memantine (NAMENDA) 5 MG tablet Take 1 tablet (5 mg total) by mouth 2 (two) times daily. (Patient taking differently: Take 5 mg by mouth daily. (0900))    . Multiple Vitamin (MULTIVITAMIN WITH MINERALS) TABS tablet Take 1 tablet by mouth daily. (0900)    . NUTRITIONAL SUPPLEMENT LIQD Take 120 mLs by mouth 2 (two) times daily.    Marland Kitchen oxyCODONE (OXY IR/ROXICODONE) 5 MG immediate release tablet Take 5 mg by mouth every  6 (six) hours as needed (pain).    [place] (OYSTER CALCIUM) 500 MG TABS tablet Take 500 mg of elemental calcium by mouth daily. (0900)    . polyethylene glycol (MIRALAX) packet Take 17 g by mouth daily. (Patient taking differently: Take 17 g by mouth 2 (two) times daily. ) 14 each 0  . pramipexole (MIRAPEX) 0.125 MG tablet Take 0.125 mg by mouth at bedtime. (2100)    . sennosides-docusate sodium (SENOKOT-S) 8.6-50 MG tablet Take 1 tablet by mouth daily as needed for constipation.    . sertraline (ZOLOFT) 50 MG tablet Take 50 mg by mouth daily. (0900)    . traMADol (ULTRAM) 50 MG tablet Take 1 tablet (50 mg total) by mouth every 6 (six) hours as needed for severe pain. (Patient taking differently: Take 50 mg by mouth 2 (two) times daily. ) 10 tablet 0  . vitamin C (ASCORBIC ACID) 500 MG tablet Take 500 mg by  mouth 2 (two) times daily.    . Vitamin D, Ergocalciferol, (DRISDOL) 50000 units CAPS capsule Take 50,000 Units by mouth every Friday.     Marland Kitchen albuterol (PROVENTIL HFA;VENTOLIN HFA) 108 (90 BASE) MCG/ACT inhaler Inhale 2 puffs into the lungs every 6 (six) hours as needed for wheezing or shortness of breath.     Marland Kitchen ipratropium (ATROVENT) 0.02 % nebulizer solution Take 2.5 mLs (0.5 mg total) by nebulization every 6 (six) hours as needed for wheezing or shortness of breath. (Patient not taking: Reported on 04/04/2018) 75 mL 12  . nystatin (MYCOSTATIN/NYSTOP) powder Apply topically 2 (two) times daily. (Patient not taking: Reported on 04/04/2018) 15 g 0  . pantoprazole (PROTONIX) 40 MG tablet Take 1 tablet (40 mg total) by mouth 2 (two) times daily before a meal. (Patient not taking: Reported on 04/04/2018)      Results for orders placed or performed during the hospital encounter of 04/11/18 (from the past 48 hour(s))  Basic metabolic panel     Status: Abnormal   Collection Time: 04/11/18 10:58 AM  Result Value Ref Range   Sodium 137 135 - 145 mmol/L   Potassium 4.2 3.5 - 5.1 mmol/L   Chloride 105 98 - 111 mmol/L   CO2 22 22 - 32 mmol/L   Glucose, Bld 103 (H) 70 - 99 mg/dL   BUN 28 (H) 8 - 23 mg/dL   Creatinine, Ser 1.41 (H) 0.61 - 1.24 mg/dL   Calcium 9.2 8.9 - 10.3 mg/dL   GFR calc non Af Amer 47 (L) >60 mL/min   GFR calc Af Amer 55 (L) >60 mL/min   Anion gap 10 5 - 15    Comment: Performed at [facility], [address], [place], [place] [address]  CBC     Status: Abnormal   Collection Time: 04/11/18 10:58 AM  Result Value Ref Range   WBC 10.2 4.0 - 10.5 K/uL   RBC 4.47 4.22 - 5.81 MIL/uL   Hemoglobin 12.3 (L) 13.0 - 17.0 g/dL   HCT 40.4 39.0 - 52.0 %   MCV 90.4 80.0 - 100.0 fL   MCH 27.5 26.0 - 34.0 pg   MCHC 30.4 30.0 - 36.0 g/dL   RDW 16.9 (H) 11.5 - 15.5 %   Platelets 258 150 - 400 K/uL   nRBC 0.0 0.0 - 0.2 %    Comment: Performed at [facility], [place] [address]  [place],  [address]   No results found.  Review of Systems  Neurological: Positive  for headaches.  All other systems reviewed and are negative.   Blood pressure (!) 120/56, pulse 72, temperature (!) 97.5 F (36.4 C), temperature source Oral, resp. rate 18, height 6\' 2"  (1.88 m), weight 68.9 kg, SpO2 100 %. Physical Exam  Constitutional: He is oriented to person, place, and time. He appears well-developed and well-nourished. No distress.  HENT:  Head: Atraumatic.  Right Ear: External ear normal.  Left Ear: External ear normal.  Nose: Nose normal.  Mouth/Throat: Oropharynx is clear and moist.  4-5 cm prominent skin tumor on right vertex scalp.  5 cm prominent lower lip tumor involving most of lower lip down to mental crease.  Edentulous.  Eyes: Pupils are equal, round, and reactive to light. Conjunctivae and EOM are normal.  Neck: Normal range of motion.  Bilateral upper neck enlarged nodes.  Cardiovascular: Normal rate.  Respiratory: Effort normal.  Musculoskeletal: Normal range of motion.  Neurological: He is alert and oriented to person, place, and time. No cranial nerve deficit.  Skin: Skin is warm and dry.  Psychiatric: He has a normal mood and affect. His behavior is normal. Judgment and thought content normal.     Assessment/Plan Lower lip cancer  To OR for lower lip excision, bilateral neck dissections, tracheostomy and reconstruction by Dr. Marla Roe.  Will place a nasogastric feeding tube for post-op feeding.  Melida Quitter, MD 04/12/2018, 10:10 AM

## 2018-04-12 NOTE — Transfer of Care (Signed)
Immediate Anesthesia Transfer of Care Note  Patient: Anthony [name]  Procedure(s) Performed: TOTAL LOWER LIP RESECTION (N/A Mouth) TRACHEOSTOMY (N/A Neck) Bilateral Radical Neck Dissection (Bilateral Neck) LOWER LIP RECONSTRUCTION WITH TISSUE REARRANGMENT OR REGIONAL FLAP (N/A Mouth)  Patient Location: PACU  Anesthesia Type:General  Level of Consciousness: awake and alert   Airway & Oxygen Therapy: Patient Spontanous Breathing and Patient connected to tracheostomy mask oxygen  Post-op Assessment: Report given to RN, Post -op Vital signs reviewed and stable and Patient moving all extremities  Post vital signs: Reviewed and stable  Last Vitals:  Vitals Value Taken Time  BP 128/58 04/12/2018  4:19 PM  Temp    Pulse 68 04/12/2018  4:30 PM  Resp 19 04/12/2018  4:31 PM  SpO2 100 % 04/12/2018  4:30 PM  Vitals shown include unvalidated device data.  Last Pain:  Vitals:   04/12/18 0904  TempSrc:   PainSc: 0-No pain         Complications: No apparent anesthesia complications

## 2018-04-12 NOTE — Progress Notes (Signed)
   ENT Progress Note: s/p Procedure(s): TOTAL LOWER LIP RESECTION TRACHEOSTOMY LOWER LIP RECONSTRUCTION WITH TISSUE REARRANGMENT OR REGIONAL FLAP Bilateral Radical Neck Dissection   Subjective: Stable  Objective: Vital signs in last 24 hours: Temp:  [97.3 F (36.3 C)-97.5 F (36.4 C)] 97.3 F (36.3 C) (01/29 1619) Pulse Rate:  [63-73] 64 (01/29 1705) Resp:  [12-22] 12 (01/29 1705) BP: (116-128)/(56-60) 116/59 (01/29 1705) SpO2:  [98 %-100 %] 98 % (01/29 1705) Arterial Line BP: (137-151)/(52-77) 137/52 (01/29 1705) FiO2 (%):  [28 %] 28 % (01/29 1619) Weight:  [68.9 kg] 68.9 kg (01/29 0918) Weight change:     Intake/Output from previous day: No intake/output data recorded. Intake/Output this shift: Total I/O In: 3000 [I.V.:3000] Out: 400 [Urine:200; Blood:200]  Labs: Recent Labs    04/11/18 1058 04/12/18 1227 04/12/18 1234  WBC 10.2  --   --   HGB 12.3* 8.5* 10.9*  HCT 40.4 25.0* 32.0*  PLT 258  --   --    Recent Labs    04/11/18 1058 04/12/18 1227 04/12/18 1234  NA 137 143 140  K 4.2 2.6* 3.7  CL 105  --   --   CO2 22  --   --   GLUCOSE 103*  --   --   BUN 28*  --   --   CALCIUM 9.2  --   --     Studies/Results: No results found.   PHYSICAL EXAM: Inc intact -  No swelling JP's intact   Assessment/Plan: Stable in PACU Pt for ICU monitoring o/n    Jerrell Belfast 04/12/2018, 5:13 PM

## 2018-04-12 NOTE — Op Note (Signed)
DATE OF OPERATION: 04/12/2018  LOCATION: [facility] In[facility]  PREOPERATIVE DIAGNOSIS: Lower lip defect from excision of squamous cell carcinoma  POSTOPERATIVE DIAGNOSIS: Same  PROCEDURE: Reconstruction of lower lip defect with a fugimori's gate flap (rotational 1.5 x 4 cm) on the right and partial Karapandzic (advancement 2 x 4 cm) on the left  SURGEON: Camillia Marcy Sanger Krista Godsil, DO  ASSISTANT: Carmen Mayo, PA  EBL: 5 cc  CONDITION: Stable  COMPLICATIONS: None  INDICATION: The patient, Anthony [name], is a 78 y.o. male born on Sep 28, 1940, is here for treatment of a lower lip defect after resection of squamous cell carcinoma.    PROCEDURE DETAILS:  The patient was seen prior to surgery and marked.  The IV antibiotics were given. The patient was taken to the operating room and given a general anesthetic. A standard time out was performed and all information was confirmed by those in the room. SCDs were placed.   The patient was prepped and draped by the ENT team.  The resection was performed by Dr. Redmond Baseman and the specimen was sent to pathology.   There was 1 cm of remaining tissue on the left and 5 mm on the right.  It was a full thickness defect that included the skin, muscle and oral buccal mucosa.  The needle tip bovie was used to make an incision along the nasolabial fold to intersect the oral defect on the right.  A 1.5 cm flap was created full thickness with mucosa included.  The inferior horizontal margin was no incised.  The flap was brought down to the lower lip and sutured in place.  The 4-0 Vicryl was used to secure the mucosa in place.  Interrupted sutures were used.  The muscle was secured at the midline with the 4-0 Vicryl.  The skin was then closed with the 5-0 Monocryl with interrupted sutured and running suture combination.    The left side had ~ 1 cm of orbicularis muscle.  The dermis and deep layer were released with the bovie in order to advance the muscle toward  the midline.  This also allowed for mucosa advancement with a buccal mucosa incision.  The #10 blade was used to make an incision at the inferior aspect lateral aspect like a karapandzic flap.  The muscle was secured to the midline with the 4-0 Vicryl at the mucosal level.  The mucosa was also secured with the 4-0 Vicryl with interrupted sutures.  The skin was closed deep with the 4-0 Vicryl followed by the 5-0 Monocryl on the skin.    The patient was left in the care of the ENT service as the neck dissection was continue.   The advanced practice practitioner (APP) assisted throughout the case.  The APP was essential in retraction and counter traction when needed to make the case progress smoothly.  This retraction and assistance made it possible to see the tissue plans for the procedure.  The assistance was needed for blood control, tissue re-approximation and assisted with closure of the incision site.

## 2018-04-13 ENCOUNTER — Encounter (HOSPITAL_COMMUNITY): Payer: Self-pay | Admitting: Otolaryngology

## 2018-04-13 ENCOUNTER — Inpatient Hospital Stay (HOSPITAL_COMMUNITY): Payer: Medicare Other

## 2018-04-13 DIAGNOSIS — E43 Unspecified severe protein-calorie malnutrition: Secondary | ICD-10-CM

## 2018-04-13 LAB — MRSA PCR SCREENING: MRSA by PCR: POSITIVE — AB

## 2018-04-13 LAB — MAGNESIUM: MAGNESIUM: 1.8 mg/dL (ref 1.7–2.4)

## 2018-04-13 LAB — GLUCOSE, CAPILLARY
GLUCOSE-CAPILLARY: 48 mg/dL — AB (ref 70–99)
Glucose-Capillary: 97 mg/dL (ref 70–99)

## 2018-04-13 LAB — PHOSPHORUS: PHOSPHORUS: 2 mg/dL — AB (ref 2.5–4.6)

## 2018-04-13 MED ORDER — ORAL CARE MOUTH RINSE
15.0000 mL | Freq: Two times a day (BID) | OROMUCOSAL | Status: DC
  Administered 2018-04-13 – 2018-04-28 (×27): 15 mL via OROMUCOSAL

## 2018-04-13 MED ORDER — HYDROCODONE-ACETAMINOPHEN 7.5-325 MG/15ML PO SOLN
15.0000 mL | ORAL | Status: DC | PRN
  Administered 2018-04-16: 15 mL
  Filled 2018-04-13: qty 15

## 2018-04-13 MED ORDER — VITAL AF 1.2 CAL PO LIQD
1000.0000 mL | ORAL | Status: DC
  Administered 2018-04-13 – 2018-04-17 (×4): 1000 mL
  Filled 2018-04-13 (×10): qty 1000

## 2018-04-13 MED ORDER — CHLORHEXIDINE GLUCONATE 0.12 % MT SOLN
15.0000 mL | Freq: Two times a day (BID) | OROMUCOSAL | Status: DC
  Administered 2018-04-13 – 2018-04-29 (×30): 15 mL via OROMUCOSAL
  Filled 2018-04-13 (×25): qty 15

## 2018-04-13 MED ORDER — DEXTROSE 50 % IV SOLN
INTRAVENOUS | Status: AC
  Administered 2018-04-13: 25 mL
  Filled 2018-04-13: qty 50

## 2018-04-13 MED ORDER — VITAL HIGH PROTEIN PO LIQD: 1000.0000 mL | ORAL | Status: DC

## 2018-04-13 MED ORDER — PANTOPRAZOLE SODIUM 40 MG IV SOLR
40.0000 mg | Freq: Two times a day (BID) | INTRAVENOUS | Status: DC
  Administered 2018-04-13 – 2018-04-26 (×27): 40 mg via INTRAVENOUS
  Filled 2018-04-13 (×32): qty 40

## 2018-04-13 MED ORDER — BACITRACIN ZINC 500 UNIT/GM EX OINT
TOPICAL_OINTMENT | Freq: Two times a day (BID) | CUTANEOUS | Status: DC
  Administered 2018-04-14 (×2): via TOPICAL
  Administered 2018-04-14: 1 via TOPICAL
  Administered 2018-04-15 – 2018-04-18 (×7): via TOPICAL
  Administered 2018-04-18 – 2018-04-19 (×2): 1 via TOPICAL
  Administered 2018-04-19 – 2018-04-21 (×4): via TOPICAL
  Administered 2018-04-21: 1 via TOPICAL
  Administered 2018-04-22 – 2018-04-23 (×3): via TOPICAL
  Administered 2018-04-23: 1 via TOPICAL
  Administered 2018-04-24 – 2018-04-26 (×5): via TOPICAL
  Filled 2018-04-13 (×2): qty 28.4

## 2018-04-13 NOTE — Progress Notes (Signed)
RN called Dr. Wilburn Cornelia about patients mouth bleeding. VS stable and patient not complaining of pain.  Dr. Wilburn Cornelia stated It is not a cause for concern at this time, however if it gets worse throughout the night call back for further instructions.  Will continue to monitor bleeding closely.  Charlsie Quest

## 2018-04-13 NOTE — Progress Notes (Addendum)
   Subjective:    Patient ID: Anthony [name], male    DOB: 01-Apr-1940, 78 y.o.   MRN: [id number]  HPI Did well overnight.  Some pain.  Review of Systems     Objective:   Physical Exam AF VSS.  Drains 245 cc Alert, NAD Lower lip incisions intact.  Neck incision intact, no fluid collection, drains functioning. Trach site stable with some bloody secretions.  Deflated cuff.     Assessment & Plan:  POD 1 s/p lip resection with reconstruction, trach, and bilateral neck dissections  Doing well.  Will transfer to step-down.  Deflated trach cuff.  Advanced NG tube.  Will obtain nutrition consult to initiate tube feeds.  Will start some NG medications.

## 2018-04-13 NOTE — Progress Notes (Signed)
Initial Nutrition Assessment  DOCUMENTATION CODES:   Severe malnutrition in context of chronic illness  INTERVENTION:   Tube Feeding:  Vital AF 1.2 at 75 ml/hr Begin at 20 ml/hr; titrate by 10 mL q 8 hours until goal rate of 75 ml/hr Goal rate Provides 126 g of protien, 2160 kcals and 1458 mL of free water Meets 100% estimated calorie and protein needs   Monitor magnesium, potassium, and phosphorus daily for at least 5 occruences, MD to replete as needed, as pt is at risk for refeeding syndrome given malnutrition and prolonged poor po intake    NUTRITION DIAGNOSIS:   Severe Malnutrition related to chronic illness(lip cancer) as evidenced by energy intake < or equal to 75% for > or equal to 1 month, severe fat depletion, severe muscle depletion.  GOAL:   Patient will meet greater than or equal to 90% of their needs  MONITOR:   TF tolerance, Diet advancement, Weight trends, Labs  REASON FOR ASSESSMENT:   Consult Enteral/tube feeding initiation and management  ASSESSMENT:   78 yo male with cancer of external lower lip that has been affecting ability to eat admitted for surgery.  Biopsy confirms squamous cell cancer with imaging confirming enlarged nodes bilaterally. Pt also with sizeable tumor on the right vertex of scalp that has been removed twice previously. PMH includes COPD, CKD, dementia, dysphagia  1/29: Total lower lip resection, bilateral selective neck dissections and trach placement; plastics reconstructed lower lip defect. NG tube placed  Pt is ward of the state and resides at [facility] Pt on trach collar; ability to communicate some but difficult to understand at times Pt indicates inability to eat much of anything over the past several months. Pt eating mostly liquids; pt was trying to drink 2 Ensure/Boost per day but not much of anything significant due to difficulty eating with enlarging lip mass  Pt reports height of 6'3" Current wt 68.9 kg; it appears  patient has lost weight per weight encounters but unable to confirm at this time  NG tube with tip in stomach, side port in esophagus with advancement recommended. Discussed with RN, RN to notify MD of tube placement  JP drains bilateral neck JP L: 77 mL JP R: 68 mL   Pt indicates he has been wheelchair bound of 6 years or so. This could explain severity of LE wasting but does not explain wasting in other areas.   Labs: reviewed Meds: D5-1/2 NS with KCl at 125 ml/hr  NUTRITION - FOCUSED PHYSICAL EXAM:    Most Recent Value  Orbital Region  Moderate depletion  Upper Arm Region  Severe depletion  Thoracic and Lumbar Region  Severe depletion  Buccal Region  Unable to assess  Temple Region  Moderate depletion  Clavicle Bone Region  Severe depletion  Clavicle and Acromion Bone Region  Severe depletion  Scapular Bone Region  Severe depletion  Dorsal Hand  Severe depletion  Patellar Region  Severe depletion  Anterior Thigh Region  Severe depletion  Posterior Calf Region  Severe depletion  Hair  Unable to assess [whole head bandaged]  Eyes  Reviewed  Mouth  Unable to assess  Skin  Reviewed  Nails  Reviewed       Diet Order:   Diet Order            Diet NPO time specified  Diet effective now              EDUCATION NEEDS:   Not appropriate for education  at this time  Skin:  Skin Assessment: Skin Integrity Issues: Skin Integrity Issues:: Incisions, Other (Comment) Incisions: neck, ching, mouth, ankle Other: mass on head (black and draining pus per RN)  Last BM:  no documented BM  Height:   Ht Readings from Last 1 Encounters:  04/13/18 6\' 3"  (1.905 m)    Weight:   Wt Readings from Last 1 Encounters:  04/12/18 68.9 kg    Ideal Body Weight:  89.1 kg  BMI:  Body mass index is 19 kg/m.  Estimated Nutritional Needs:   Kcal:  1950-2250 kcals   Protein:  105-140 g   Fluid:  >/= 2 L   Kerman Passey MS, RD, LDN, CNSC [phone] Pager  [phone]  Weekend/On-Call Pager

## 2018-04-13 NOTE — H&P (View-Only) (Signed)
Subjective: Pleasant 78 year old male pt is day one s/p lower lip  Resection and bilateral selective neck dissection and tracheostomy. Dr. Marla Roe was present in the OR to reconstruct the lower lip defect with A Fugimori gate flap and partial Karapandzic on the right.    Pt is in bed resting comfortably.  Marland Kitchen  He denies discomfort.  The nurse said he has been giving the pt Morphine.  NG still in place.  Pt is supposed to be going to step down unit today. He had a consult with nutrition today.   Objective: Vital signs in last 24 hours: Temp:  [97.5 F (36.4 C)-99 F (37.2 C)] 99 F (37.2 C) (01/30 1543) Pulse Rate:  [63-92] 88 (01/30 1600) Resp:  [11-23] 17 (01/30 1600) BP: (96-129)/(36-80) 110/60 (01/30 1600) SpO2:  [89 %-100 %] 100 % (01/30 1600) Arterial Line BP: (104-162)/(34-77) 104/36 (01/30 1600) FiO2 (%):  [28 %] 28 % (01/30 1600) Weight change:  Last BM Date: (unknown, PTA)  Intake/Output from previous day: 01/29 0701 - 01/30 0700 In: 4470 [I.V.:4320] Out: 1695 [Urine:1150; Emesis/NG output:100; Drains:245; Blood:200] Intake/Output this shift: Total I/O In: 1121.8 [I.V.:1121.8] Out: 350 [Urine:350]  Pt is alert Incision are clean and dry. NG in place Pt on room air Pt can squeeze my hand on command  Pt moved both lower extremities on command   Lab Results: Recent Labs    04/11/18 1058 04/12/18 1227 04/12/18 1234  WBC 10.2  --   --   HGB 12.3* 8.5* 10.9*  HCT 40.4 25.0* 32.0*  PLT 258  --   --    BMET Recent Labs    04/11/18 1058 04/12/18 1227 04/12/18 1234  NA 137 143 140  K 4.2 2.6* 3.7  CL 105  --   --   CO2 22  --   --   GLUCOSE 103*  --   --   BUN 28*  --   --   CREATININE 1.41*  --   --   CALCIUM 9.2  --   --     Studies/Results: Dg Abd 1 View  Result Date: 04/13/2018 CLINICAL DATA:  Check gastric catheter placement EXAM: ABDOMEN - 1 VIEW COMPARISON:  04/12/2018 FINDINGS: Gastric catheter has been advanced and now lies within the stomach.  Proximal side port lies within the distal esophagus. This could be advanced several cm. Scattered large and small bowel gas is noted. Degenerative changes of lumbar spine are seen. IMPRESSION: Gastric catheter tip is noted within the stomach. Proximal side port lies in the distal esophagus. The tube could be advanced several cm as necessary. Electronically Signed   By: Inez Catalina M.D.   On: 04/13/2018 10:58   Dg Abd 1 View  Result Date: 04/12/2018 CLINICAL DATA:  78 year old male with a history of gastric tube placement. EXAM: ABDOMEN - 1 VIEW COMPARISON:  None. FINDINGS: Limited plain film abdomen of the lower chest at the epigastric region demonstrates the gastric tube terminating in the distal esophagus above the GE junction. Gas within small bowel.  Unremarkable appearance of the lower chest. IMPRESSION: Gastric tube terminates in the distal esophagus above the GE junction. Electronically Signed   By: Corrie Mckusick D.O.   On: 04/12/2018 17:30    Medications: I have reviewed the patient's current medications.  Assessment/Plan: Pt will be moving to step down unit We will continue to monitor this pt  LOS: 1 day    Melida Gimenez, [facility] Surgery 04/13/2018

## 2018-04-13 NOTE — Social Work (Signed)
Pt from [facility], pt is a ward of the state. His legal guardian is Betsey Amen at [phone] ext [phone]. Contact at [facility] is Inez Catalina [phone].  CSW continuing to follow for support with disposition when medically appropriate.  Westley Hummer, MSW, [place] Work [phone]

## 2018-04-13 NOTE — Anesthesia Postprocedure Evaluation (Signed)
Anesthesia Post Note  Patient: JAKWON GAYTON  Procedure(s) Performed: TOTAL LOWER LIP RESECTION (N/A Mouth) TRACHEOSTOMY (N/A Neck) Bilateral Radical Neck Dissection (Bilateral Neck) LOWER LIP RECONSTRUCTION WITH TISSUE REARRANGMENT OR REGIONAL FLAP (N/A Mouth)     Patient location during evaluation: PACU Anesthesia Type: General Level of consciousness: awake and alert Pain management: pain level controlled Vital Signs Assessment: post-procedure vital signs reviewed and stable Respiratory status: spontaneous breathing, nonlabored ventilation, respiratory function stable and patient connected to tracheostomy mask oxygen Cardiovascular status: blood pressure returned to baseline and stable Postop Assessment: no apparent nausea or vomiting Anesthetic complications: no    Last Vitals:  Vitals:   04/13/18 1600 04/13/18 1936  BP: 110/60 (!) 114/51  Pulse: 88 88  Resp: 17 18  Temp:  37.6 C  SpO2: 100% 100%    Last Pain:  Vitals:   04/13/18 1936  TempSrc: Axillary  PainSc:    Pain Goal:                   Lidia Collum

## 2018-04-13 NOTE — Progress Notes (Signed)
Subjective: Pleasant 78 year old male pt is day one s/p lower lip  Resection and bilateral selective neck dissection and tracheostomy. Dr. Marla Roe was present in the OR to reconstruct the lower lip defect with A Fugimori gate flap and partial Karapandzic on the right.    Pt is in bed resting comfortably.  Marland Kitchen  He denies discomfort.  The nurse said he has been giving the pt Morphine.  NG still in place.  Pt is supposed to be going to step down unit today. He had a consult with nutrition today.   Objective: Vital signs in last 24 hours: Temp:  [97.5 F (36.4 C)-99 F (37.2 C)] 99 F (37.2 C) (01/30 1543) Pulse Rate:  [63-92] 88 (01/30 1600) Resp:  [11-23] 17 (01/30 1600) BP: (96-129)/(36-80) 110/60 (01/30 1600) SpO2:  [89 %-100 %] 100 % (01/30 1600) Arterial Line BP: (104-162)/(34-77) 104/36 (01/30 1600) FiO2 (%):  [28 %] 28 % (01/30 1600) Weight change:  Last BM Date: (unknown, PTA)  Intake/Output from previous day: 01/29 0701 - 01/30 0700 In: 4470 [I.V.:4320] Out: 1695 [Urine:1150; Emesis/NG output:100; Drains:245; Blood:200] Intake/Output this shift: Total I/O In: 1121.8 [I.V.:1121.8] Out: 350 [Urine:350]  Pt is alert Incision are clean and dry. NG in place Pt on room air Pt can squeeze my hand on command  Pt moved both lower extremities on command   Lab Results: Recent Labs    04/11/18 1058 04/12/18 1227 04/12/18 1234  WBC 10.2  --   --   HGB 12.3* 8.5* 10.9*  HCT 40.4 25.0* 32.0*  PLT 258  --   --    BMET Recent Labs    04/11/18 1058 04/12/18 1227 04/12/18 1234  NA 137 143 140  K 4.2 2.6* 3.7  CL 105  --   --   CO2 22  --   --   GLUCOSE 103*  --   --   BUN 28*  --   --   CREATININE 1.41*  --   --   CALCIUM 9.2  --   --     Studies/Results: Dg Abd 1 View  Result Date: 04/13/2018 CLINICAL DATA:  Check gastric catheter placement EXAM: ABDOMEN - 1 VIEW COMPARISON:  04/12/2018 FINDINGS: Gastric catheter has been advanced and now lies within the stomach.  Proximal side port lies within the distal esophagus. This could be advanced several cm. Scattered large and small bowel gas is noted. Degenerative changes of lumbar spine are seen. IMPRESSION: Gastric catheter tip is noted within the stomach. Proximal side port lies in the distal esophagus. The tube could be advanced several cm as necessary. Electronically Signed   By: Inez Catalina M.D.   On: 04/13/2018 10:58   Dg Abd 1 View  Result Date: 04/12/2018 CLINICAL DATA:  78 year old male with a history of gastric tube placement. EXAM: ABDOMEN - 1 VIEW COMPARISON:  None. FINDINGS: Limited plain film abdomen of the lower chest at the epigastric region demonstrates the gastric tube terminating in the distal esophagus above the GE junction. Gas within small bowel.  Unremarkable appearance of the lower chest. IMPRESSION: Gastric tube terminates in the distal esophagus above the GE junction. Electronically Signed   By: Corrie Mckusick D.O.   On: 04/12/2018 17:30    Medications: I have reviewed the patient's current medications.  Assessment/Plan: Pt will be moving to step down unit We will continue to monitor this pt  LOS: 1 day    Melida Gimenez, [facility] Plastic Surgery 04/13/2018

## 2018-04-14 LAB — GLUCOSE, CAPILLARY
GLUCOSE-CAPILLARY: 102 mg/dL — AB (ref 70–99)
Glucose-Capillary: 128 mg/dL — ABNORMAL HIGH (ref 70–99)
Glucose-Capillary: 139 mg/dL — ABNORMAL HIGH (ref 70–99)
Glucose-Capillary: 128 mg/dL — ABNORMAL HIGH (ref 70–99)
Glucose-Capillary: 124 mg/dL — ABNORMAL HIGH (ref 70–99)
Glucose-Capillary: 109 mg/dL — ABNORMAL HIGH (ref 70–99)

## 2018-04-14 LAB — MAGNESIUM
Magnesium: 1.8 mg/dL (ref 1.7–2.4)
Magnesium: 1.9 mg/dL (ref 1.7–2.4)

## 2018-04-14 LAB — PHOSPHORUS
Phosphorus: 2.3 mg/dL — ABNORMAL LOW (ref 2.5–4.6)
Phosphorus: 2.7 mg/dL (ref 2.5–4.6)

## 2018-04-14 MED ORDER — CHLORHEXIDINE GLUCONATE CLOTH 2 % EX PADS
6.0000 | MEDICATED_PAD | Freq: Every day | CUTANEOUS | Status: AC
  Administered 2018-04-14 – 2018-04-18 (×5): 6 via TOPICAL

## 2018-04-14 MED ORDER — MUPIROCIN 2 % EX OINT
1.0000 "application " | TOPICAL_OINTMENT | Freq: Two times a day (BID) | CUTANEOUS | Status: AC
  Administered 2018-04-14 – 2018-04-18 (×10): 1 via NASAL
  Filled 2018-04-14 (×3): qty 22

## 2018-04-14 NOTE — Progress Notes (Signed)
Trach care performed with no complications.  Stitches remain in place.  Will cont to monitor.

## 2018-04-14 NOTE — Progress Notes (Addendum)
Hypoglycemic Event  CBG: 47  Treatment: 60ml  Dextrose IV  Symptoms: Restless  Follow-up CBG: YBTV:3917 CBG Result:97  Possible Reasons for Event: Poor intake. Just started TF at 1652  Comments/MD notified:Rodes,MD    Lester Kinsman

## 2018-04-14 NOTE — Progress Notes (Signed)
Suprapubic catheter bag was leaking so new bag was placed.

## 2018-04-14 NOTE — Progress Notes (Signed)
   Subjective:    Patient ID: Anthony [name], male    DOB: 1940/12/09, 78 y.o.   MRN: [id number]  HPI  Now in progressive care.  No complaints.  Resting comfortably.  Review of Systems     Objective:   Physical Exam Tm 100.6 VSS Drains 40 cc right, 45 cc left Alert, NAD Lower lip reconstruction intact, some bloody crusting Neck incision clean and intact, no fluid collection, drains functioning Trach site stable with cuff deflated    Assessment & Plan:  Lip cancer s/p resection, reconstruction, trach, neck dissections POD 2  Doing well.  Tube feeds have been started and advancing.  Drains functioning.  Continue progressive care setting.  Will consult PT/OT.

## 2018-04-14 NOTE — Progress Notes (Signed)
Subjective: Pleasant 78 year old male pt is day 2 s/p lower lip resection and bilateral selective dissection and tracheostomy.  The pt communicates by hand motions.  He indicates pain on his head from the mass and neck pain.  He indicates a low level of pain in his face.  The pt indicates he would like to move around in his wheel chair.  NG still in place.  Nutrition started.  I am pleased he would like to be more active.   Objective: Vital signs in last 24 hours: Temp:  [97.7 F (36.5 C)-100.6 F (38.1 C)] 100.2 F (37.9 C) (01/31 1205) Pulse Rate:  [77-105] 77 (01/31 1205) Resp:  [15-20] 16 (01/31 1205) BP: (105-117)/(36-62) 106/53 (01/31 1205) SpO2:  [95 %-100 %] 100 % (01/31 1205) Arterial Line BP: (104)/(36) 104/36 (01/30 1600) FiO2 (%):  [28 %] 28 % (01/31 1135) Weight:  [72.6 kg] 72.6 kg (01/31 0235) Weight change: 3.653 kg Last BM Date: (UTA)  Intake/Output from previous day: 01/30 0701 - 01/31 0700 In: 3129.3 [I.V.:2829.3; NG/GT:300] Out: 1790 [Urine:1700; Drains:90] Intake/Output this shift: Total I/O In: 155 [I.V.:125; NG/GT:30] Out: -   Pt is alert On room air abd is soft movement and sensation present in upper and lower extremities Trach and NG still in place Pt in bed Incisions on face are healing well No erythema, discharge or sign of infection  Lab Results: Recent Labs    04/12/18 1227 04/12/18 1234  HGB 8.5* 10.9*  HCT 25.0* 32.0*   BMET Recent Labs    04/12/18 1227 04/12/18 1234  NA 143 140  K 2.6* 3.7    Studies/Results: Dg Abd 1 View  Result Date: 04/13/2018 CLINICAL DATA:  Check gastric catheter placement EXAM: ABDOMEN - 1 VIEW COMPARISON:  04/12/2018 FINDINGS: Gastric catheter has been advanced and now lies within the stomach. Proximal side port lies within the distal esophagus. This could be advanced several cm. Scattered large and small bowel gas is noted. Degenerative changes of lumbar spine are seen. IMPRESSION: Gastric catheter tip is  noted within the stomach. Proximal side port lies in the distal esophagus. The tube could be advanced several cm as necessary. Electronically Signed   By: Inez Catalina M.D.   On: 04/13/2018 10:58   Dg Abd 1 View  Result Date: 04/12/2018 CLINICAL DATA:  78 year old male with a history of gastric tube placement. EXAM: ABDOMEN - 1 VIEW COMPARISON:  None. FINDINGS: Limited plain film abdomen of the lower chest at the epigastric region demonstrates the gastric tube terminating in the distal esophagus above the GE junction. Gas within small bowel.  Unremarkable appearance of the lower chest. IMPRESSION: Gastric tube terminates in the distal esophagus above the GE junction. Electronically Signed   By: Corrie Mckusick D.O.   On: 04/12/2018 17:30    Medications: I have reviewed the patient's current medications.  Assessment/Plan: Dr. Redmond Baseman has consulted PT and OT We will continue to monitor pt  LOS: 2 days    Melida Gimenez, [facility] Plastic Surgery 04/14/2018

## 2018-04-15 LAB — GLUCOSE, CAPILLARY
GLUCOSE-CAPILLARY: 111 mg/dL — AB (ref 70–99)
Glucose-Capillary: 107 mg/dL — ABNORMAL HIGH (ref 70–99)
Glucose-Capillary: 127 mg/dL — ABNORMAL HIGH (ref 70–99)
Glucose-Capillary: 174 mg/dL — ABNORMAL HIGH (ref 70–99)
Glucose-Capillary: 178 mg/dL — ABNORMAL HIGH (ref 70–99)
Glucose-Capillary: 97 mg/dL (ref 70–99)

## 2018-04-15 NOTE — Progress Notes (Signed)
   Subjective:    Patient ID: Anthony [name], male    DOB: 01-06-1941, 78 y.o.   MRN: [id number]  HPI Doing well.  Surgical pain.  Tolerating tube feeds.  Review of Systems     Objective:   Physical Exam Tm 100.6 VSS Drains left 10 cc, right 1865 (inaccurate measure) Alert, NAD NG tube in place Lip incisions intact Neck incision intact, no fluid collection, drains functioning, removed left drain Trach site stable with yellow secretions    Assessment & Plan:  Lip cancer s/p lip resection, reconstruction, tracheostomy, bilateral neck dissections  Progressing well.  Removed left drain.  Will likely remove right drain tomorrow.  Tolerating tube feeds.  Remains NPO to allow lip reconstruction to heal.  Plan to change trach tube Monday.  Do not need SLP consult yet.  Hoping patient may progress to an oral diet when Plastics feels this is OK, may avoid G-tube.  He came from [facility].  Will consult social work to begin discharge planning with trach tube, possible G-tube.

## 2018-04-15 NOTE — Progress Notes (Signed)
OT Cancellation Note  Patient Details Name: Anthony [name] TREAT MRN: [id number] DOB: [birth year]   Cancelled Treatment:    Reason Eval/Treat Not Completed: Other (comment)(nsg requested OT return at later time to let pt sleep/rest. OT will return at later time to evaluate pt when appropriate.   Dorinda Hill OTR/L Acute Rehabilitation Services Office: [place] 04/15/2018, 1:15 PM

## 2018-04-15 NOTE — Progress Notes (Signed)
PT Cancellation Note  Patient Details Name: Anthony [name] MRN: [id number] DOB: 1940/06/21   Cancelled Treatment:    Reason Eval/Treat Not Completed: Other (comment).  Pt was not feeling up to getting up to side of bed yet, and will try again as time and pt allow.   Ramond Dial 04/15/2018, 10:54 AM   Mee Hives, PT MS Acute Rehab Dept. Number: [place]

## 2018-04-16 LAB — GLUCOSE, CAPILLARY
GLUCOSE-CAPILLARY: 128 mg/dL — AB (ref 70–99)
Glucose-Capillary: 116 mg/dL — ABNORMAL HIGH (ref 70–99)
Glucose-Capillary: 126 mg/dL — ABNORMAL HIGH (ref 70–99)
Glucose-Capillary: 126 mg/dL — ABNORMAL HIGH (ref 70–99)
Glucose-Capillary: 136 mg/dL — ABNORMAL HIGH (ref 70–99)
Glucose-Capillary: 144 mg/dL — ABNORMAL HIGH (ref 70–99)

## 2018-04-16 NOTE — Evaluation (Signed)
Physical Therapy Evaluation Patient Details Name: Anthony [name] MRN: [id number] DOB: 02/15/1941 Today's Date: 04/16/2018   History of Present Illness  Admitted for management of squamous cell carcinoma of lip; now s/p total lower lip resection, tracheostomy, Reconstruction of lower lip defect, bilateral radical neck dissection due to enlarged nodes in both superior necks;  has a pertinent past medical history of Acute pyelonephritis (12/23/2014), Arthritis, Asthma, BPH (benign prostatic hyperplasia), Cancer ([place]), Chronic back pain, Chronic hip pain, CKD (chronic kidney disease), COPD (chronic obstructive pulmonary disease) ([place]), Dementia ([place]), Depression, Dysphagia, GERD (gastroesophageal reflux disease), Headache, History of kidney stones, Hypertension, Kidney stone, Neuropathy, Pneumonia, PTSD (post-traumatic stress disorder), Pulmonary nodules (12/25/2014), and Restless leg syndrome.  Clinical Impression   Patient is s/p above surgery resulting in functional limitations due to the deficits listed below (see PT Problem List). Not entirely clear of PLOF, but noted in chart review that at previous admission, he was able to walk short distances with RW; Presents with decr functional mobility, decr activity tolerance, weakness;  Patient will benefit from skilled PT to increase their independence and safety with mobility to allow discharge to the venue listed below.       Follow Up Recommendations SNF    Equipment Recommendations  Other (comment)(tbd at SNF)    Recommendations for Other Services       Precautions / Restrictions Precautions Precautions: Fall;Other (comment) Precaution Comments: Trach, NG tube Restrictions Weight Bearing Restrictions: No      Mobility  Bed Mobility Overal bed mobility: Needs Assistance Bed Mobility: Sidelying to Sit   Sidelying to sit: +2 for physical assistance;Mod assist   Sit to supine: Mod assist;HOB elevated   General bed mobility comments:  Cues and min assist to move feet off of the bed; Mod assist to elevate trun to sitting  Transfers Overall transfer level: Needs assistance Equipment used: 2 person hand held assist Transfers: Sit to/from Stand;Stand Pivot Transfers Sit to Stand: +2 physical assistance;Max assist Stand pivot transfers: +2 physical assistance;Max assist      Lateral/Scoot Transfers: Max assist;+2 physical assistance General transfer comment: REquires +2 assist, and bil knee block for standing and pivoting to recliner; Opted to stand for RN to change sacral dressing, and check for cleanliness/hygiene; very fatigued  Ambulation/Gait                Stairs            Wheelchair Mobility    Modified Rankin (Stroke Patients Only)       Balance Overall balance assessment: Needs assistance   Sitting balance-Leahy Scale: Fair       Standing balance-Leahy Scale: Zero                               Pertinent Vitals/Pain Pain Assessment: Faces Faces Pain Scale: Hurts even more Pain Location: face and neck Pain Descriptors / Indicators: Grimacing Pain Intervention(s): Monitored during session    Home Living Family/patient expects to be discharged to:: Skilled nursing facility                      Prior Function Level of Independence: Needs assistance   Gait / Transfers Assistance Needed: assisted transfers and short distanced ambulation with RW per patient  ADL's / Homemaking Assistance Needed: assisted by ALF staff  Comments: Above PLOF gleaned from chart review; Pt unable to give info about PLOF today     Hand  Dominance        Extremity/Trunk Assessment   Upper Extremity Assessment Upper Extremity Assessment: Generalized weakness    Lower Extremity Assessment Lower Extremity Assessment: Generalized weakness       Communication   Communication: Tracheostomy  Cognition Arousal/Alertness: Awake/alert Behavior During Therapy: WFL for tasks  assessed/performed;Flat affect Overall Cognitive Status: Difficult to assess                                        General Comments General comments (skin integrity, edema, etc.): Session conducted on supplemental O2; VSS throughout    Exercises     Assessment/Plan    PT Assessment Patient needs continued PT services  PT Problem List Decreased strength;Decreased range of motion;Decreased activity tolerance;Decreased balance;Decreased mobility;Decreased coordination;Decreased knowledge of use of DME;Decreased safety awareness;Decreased knowledge of precautions;Cardiopulmonary status limiting activity;Pain       PT Treatment Interventions DME instruction;Gait training;Functional mobility training;Therapeutic activities;Therapeutic exercise;Balance training;Neuromuscular re-education;Cognitive remediation;Patient/family education;Wheelchair mobility training    PT Goals (Current goals can be found in the Care Plan section)  Acute Rehab PT Goals Patient Stated Goal: Did not state PT Goal Formulation: Patient unable to participate in goal setting Time For Goal Achievement: 04/30/18 Potential to Achieve Goals: Fair    Frequency Min 2X/week   Barriers to discharge        Co-evaluation               AM-PAC PT "6 Clicks" Mobility  Outcome Measure Help needed turning from your back to your side while in a flat bed without using bedrails?: A Lot Help needed moving from lying on your back to sitting on the side of a flat bed without using bedrails?: A Lot Help needed moving to and from a bed to a chair (including a wheelchair)?: A Lot Help needed standing up from a chair using your arms (e.g., wheelchair or bedside chair)?: A Lot Help needed to walk in hospital room?: Total Help needed climbing 3-5 steps with a railing? : Total 6 Click Score: 10    End of Session Equipment Utilized During Treatment: Oxygen Activity Tolerance: Patient limited by  fatigue;Patient limited by pain Patient left: in chair;with call bell/phone within reach;with nursing/sitter in room Nurse Communication: Mobility status;Other (comment)(options for back to bed) PT Visit Diagnosis: Muscle weakness (generalized) (M62.81);Other abnormalities of gait and mobility (R26.89)    Time: 4580-9983 PT Time Calculation (min) (ACUTE ONLY): 33 min   Charges:   PT Evaluation $PT Eval Moderate Complexity: 1 Mod PT Treatments $Self Care/Home Management: [place], PT  Acute Rehabilitation Services Pager [phone] Office [phone]   Colletta [place] 04/16/2018, 2:01 PM

## 2018-04-16 NOTE — Evaluation (Signed)
Occupational Therapy Evaluation Patient Details Name: Anthony [name] MRN: [id number] DOB: Oct 01, 1940 Today's Date: 04/16/2018    History of Present Illness Admitted for management of squamous cell carcinoma of lip; now s/p total lower lip resection, tracheostomy, Reconstruction of lower lip defect, bilateral radical neck dissection due to enlarged nodes in both superior necks;  has a pertinent past medical history of Acute pyelonephritis (12/23/2014), Arthritis, Asthma, BPH (benign prostatic hyperplasia), Cancer ([place]), Chronic back pain, Chronic hip pain, CKD (chronic kidney disease), COPD (chronic obstructive pulmonary disease) ([place]), Dementia ([place]), Depression, Dysphagia, GERD (gastroesophageal reflux disease), Headache, History of kidney stones, Hypertension, Kidney stone, Neuropathy, Pneumonia, PTSD (post-traumatic stress disorder), Pulmonary nodules (12/25/2014), and Restless leg syndrome.   Clinical Impression   Pt admitted with the above diagnoses and presents with below problem list. Pt will benefit from continued acute OT to address the below listed deficits and maximize independence with basic ADLs prior to d/c back to SNF. PTA pt was living at [facility] and needing some assistance with ADLs per chart review. Pt is currently max A with UB ADLs, bed level assist for LB ADLs, lateral scoot from drop arm recliner to EOB with +2 max A.      Follow Up Recommendations  SNF    Equipment Recommendations  Other (comment)(defer to next venue)    Recommendations for Other Services       Precautions / Restrictions Precautions Precautions: Fall;Other (comment) Precaution Comments: Trach, NG tube Restrictions Weight Bearing Restrictions: No      Mobility Bed Mobility Overal bed mobility: Needs Assistance Bed Mobility: Sit to Supine   Sidelying to sit: +2 for physical assistance;Mod assist   Sit to supine: Mod assist;HOB elevated   General bed mobility comments: Assist to advance BLE up and  onto bed.  Transfers Overall transfer level: Needs assistance Equipment used: 2 person hand held assist Transfers: Lateral/Scoot Transfers Sit to Stand: +2 physical assistance;Max assist Stand pivot transfers: +2 physical assistance;Max assist      Lateral/Scoot Transfers: Max assist;+2 physical assistance General transfer comment: from drop-arm recliner to EOB using bed pad underneath pt for support.     Balance                                           ADL either performed or assessed with clinical judgement   ADL Overall ADL's : Needs assistance/impaired Eating/Feeding: NPO   Grooming: Sitting;Maximal assistance   Upper Body Bathing: Maximal assistance   Lower Body Bathing: Maximal assistance;Sit to/from stand   Upper Body Dressing : Maximal assistance;Sitting   Lower Body Dressing: Maximal assistance;Sit to/from stand   Toilet Transfer: Maximal assistance;+2 for physical assistance;Requires drop arm Toilet Transfer Details (indicate cue type and reason): lateral scoot using bed pad and +2 assist           General ADL Comments: Pt completed lateral scoot with +2 max A to go from drop arm recliner to EOB.      Vision         Perception     Praxis      Pertinent Vitals/Pain Pain Assessment: Faces Faces Pain Scale: Hurts even more Pain Location: face and neck Pain Descriptors / Indicators: Grimacing Pain Intervention(s): Monitored during session;Repositioned     Hand Dominance     Extremity/Trunk Assessment Upper Extremity Assessment Upper Extremity Assessment: Generalized weakness   Lower Extremity Assessment Lower Extremity  Assessment: Generalized weakness       Communication Communication Communication: Tracheostomy   Cognition Arousal/Alertness: Awake/alert Behavior During Therapy: WFL for tasks assessed/performed;Flat affect Overall Cognitive Status: Difficult to assess                                      General Comments       Exercises     Shoulder Instructions      Home Living Family/patient expects to be discharged to:: Skilled nursing facility                                        Prior Functioning/Environment Level of Independence: Needs assistance  Gait / Transfers Assistance Needed: assisted transfers and short distanced ambulation with RW per patient ADL's / Homemaking Assistance Needed: assisted by ALF staff   Comments: Above PLOF gleaned from chart review; Pt unable to give info about PLOF today        OT Problem List: Decreased strength;Decreased activity tolerance;Impaired balance (sitting and/or standing);Decreased knowledge of use of DME or AE;Decreased knowledge of precautions;Pain      OT Treatment/Interventions: Self-care/ADL training;Energy conservation;DME and/or AE instruction;Therapeutic activities;Patient/family education;Balance training    OT Goals(Current goals can be found in the care plan section) Acute Rehab OT Goals OT Goal Formulation: With patient Time For Goal Achievement: 04/30/18 Potential to Achieve Goals: Good ADL Goals Pt Will Perform Upper Body Bathing: with min assist;sitting Pt Will Perform Lower Body Bathing: with mod assist;sit to/from stand Pt Will Perform Upper Body Dressing: with min assist;sitting Pt Will Perform Lower Body Dressing: with mod assist;sit to/from stand Pt Will Transfer to Toilet: ambulating;with min assist Pt Will Perform Toileting - Clothing Manipulation and hygiene: with min assist;sit to/from stand  OT Frequency: Min 2X/week   Barriers to D/C:            Co-evaluation              AM-PAC OT "6 Clicks" Daily Activity     Outcome Measure Help from another person eating meals?: Total Help from another person taking care of personal grooming?: A Lot Help from another person toileting, which includes using toliet, bedpan, or urinal?: A Lot Help from another person bathing  (including washing, rinsing, drying)?: A Lot Help from another person to put on and taking off regular upper body clothing?: A Lot Help from another person to put on and taking off regular lower body clothing?: A Lot 6 Click Score: 11   End of Session Nurse Communication: Other (comment)(nurse assisting with transfer)  Activity Tolerance: Patient limited by fatigue;Patient limited by pain Patient left: in bed;with call bell/phone within reach;with bed alarm set;with SCD's reapplied  OT Visit Diagnosis: Unsteadiness on feet (R26.81);Muscle weakness (generalized) (M62.81);Pain                Time: 2774-1287 OT Time Calculation (min): 28 min Charges:  OT General Charges $OT Visit: 1 Visit OT Evaluation $OT Eval Moderate Complexity: [place], OT Acute Rehabilitation Services Pager: [phone] Office: [phone]   Hortencia Pilar 04/16/2018, 1:21 PM

## 2018-04-16 NOTE — Progress Notes (Signed)
   Subjective:    Patient ID: Anthony [name], male    DOB: 08-17-1940, 78 y.o.   MRN: [id number]  HPI Continues to do well.  Tolerating tube feeds.  Did not participate with PT/OT yesterday.  Review of Systems     Objective:   Physical Exam Tm 100.8 VSS Drain 15 cc Alert, NAD Lower lip reconstruction intact Neck incision clean and intact, no fluid collection, removed drain Trach site stable with cuff deflated, tan secretions     Assessment & Plan:  Lip cancer s/p lip resection, reconstruction, tracheostomy, and bilateral neck dissections  Progressing as well as expected.  Doing well with tube feeds.  Will continue these until plastics feels it is OK to try oral diet.  Still possible that a G-tube will be needed.  Continue PT/OT efforts.  Removed second drain.  Plan trach change tomorrow.

## 2018-04-17 ENCOUNTER — Inpatient Hospital Stay (HOSPITAL_COMMUNITY): Payer: Medicare Other

## 2018-04-17 LAB — GLUCOSE, CAPILLARY
GLUCOSE-CAPILLARY: 120 mg/dL — AB (ref 70–99)
GLUCOSE-CAPILLARY: 127 mg/dL — AB (ref 70–99)
GLUCOSE-CAPILLARY: 133 mg/dL — AB (ref 70–99)
Glucose-Capillary: 133 mg/dL — ABNORMAL HIGH (ref 70–99)
Glucose-Capillary: 135 mg/dL — ABNORMAL HIGH (ref 70–99)
Glucose-Capillary: 136 mg/dL — ABNORMAL HIGH (ref 70–99)

## 2018-04-17 LAB — URINALYSIS, ROUTINE W REFLEX MICROSCOPIC
Bilirubin Urine: NEGATIVE
Glucose, UA: NEGATIVE mg/dL
Ketones, ur: NEGATIVE mg/dL
Nitrite: POSITIVE — AB
Protein, ur: 30 mg/dL — AB
Specific Gravity, Urine: 1.014 (ref 1.005–1.030)
pH: 7 (ref 5.0–8.0)

## 2018-04-17 MED ORDER — SULFAMETHOXAZOLE-TRIMETHOPRIM 200-40 MG/5ML PO SUSP
20.0000 mL | Freq: Two times a day (BID) | ORAL | Status: DC
  Administered 2018-04-17: 20 mL
  Filled 2018-04-17 (×3): qty 20

## 2018-04-17 NOTE — Progress Notes (Signed)
On call [phone].

## 2018-04-17 NOTE — Progress Notes (Signed)
   Subjective:    Patient ID: Anthony [name], male    DOB: 13-Aug-1940, 78 y.o.   MRN: [id number]  HPI Continues to do well.  No specific complaints.  Review of Systems     Objective:   Physical Exam Tm 101.7 VSS Alert, NAD Lip reconstruction with some breakdown centrally Neck incision clean and intact Trach site stable, changed tube to #6 cuffless without difficulty, sutures removed  Chest x-ray with no evidence of pneumonia    Assessment & Plan:  Lip cancer s/p resection, reconstruction, tracheostomy, bilateral neck dissections  Continues to progress.  Trach tube changed to cuffless tube.  Will consult SLP fo Passy-Muir valve.  Advanced feeding tube again and resecured.  Continues to tolerate tube feeds.  Waiting for input from plastics as to when an oral diet may be considered.  If no plan for an oral diet in next week or less, would favor proceeding with PEG placement.

## 2018-04-17 NOTE — Progress Notes (Signed)
Subjective: Pleasant 78 year old male pt is day 5 s/p lower lip resection and b/l selective neck  dissection and tracheostomy.  Pt indicates by holding up the "ok" sign with his fingers.  He denies pain.  Pt is requiring assistance when he worked with PT/OT yesterday.  Tube feeds going well.   Objective: Vital signs in last 24 hours: Temp:  [99 F (37.2 C)-101.7 F (38.7 C)] 99 F (37.2 C) (02/03 0700) Pulse Rate:  [75-86] 79 (02/03 0902) Resp:  [16-23] 21 (02/03 0902) BP: (97-103)/(47-56) 103/47 (02/03 0700) SpO2:  [94 %-100 %] 100 % (02/03 0902) FiO2 (%):  [28 %] 28 % (02/03 0902) Weight:  [72.6 kg] 72.6 kg (02/03 0500) Weight change:  Last BM Date: (UTA)  Intake/Output from previous day: 02/02 0701 - 02/03 0700 In: 961.6 [I.V.:252.3; NG/GT:709.3] Out: 1595 [Urine:1195; Drains:400] Intake/Output this shift: No intake/output data recorded.  Pt is alert Trach and G tube in place Pt able to move both UE and lower extremities Pt on room air abd soft  Lab Results: No results for input(s): WBC, HGB, HCT, PLT in the last 72 hours. BMET No results for input(s): NA, K, CL, CO2, GLUCOSE, BUN, CREATININE, CALCIUM in the last 72 hours.  Studies/Results: Dg Chest 2 View  Result Date: 04/17/2018 CLINICAL DATA:  Fever. EXAM: CHEST - 2 VIEW COMPARISON:  Abdominal x-ray dated April 13, 2018. FINDINGS: Tracheostomy tube in good position with the tip at the thoracic inlet. Interval retraction of the enteric tube with the tip in the lower esophagus. The heart size and mediastinal contours are within normal limits. Normal pulmonary vascularity. Mildly coarsened interstitial markings are likely smoking-related. The lungs are mildly hyperinflated with emphysematous changes. 11 mm nodular density in the peripheral right mid lung. No focal consolidation, pleural effusion, or pneumothorax. No acute osseous abnormality. IMPRESSION: 1.  No active cardiopulmonary disease. 2. Interval retraction of the  enteric tube with the tip now in the lower esophagus. Recommend advancement. 3. 11 mm nodular density in the peripheral right mid lung. Recommend non-emergent chest CT for further evaluation. 4. COPD. Electronically Signed   By: Titus Dubin M.D.   On: 04/17/2018 10:54    Medications: I have reviewed the patient's current medications.  Assessment/Plan: We will continue to monitor pt Will discuss progress with Dr. Marla Roe  LOS: 5 days    Melida Gimenez, [facility] 04/17/2018

## 2018-04-17 NOTE — Progress Notes (Signed)
NG tube removed. No distress noted. Cath intact.

## 2018-04-17 NOTE — Progress Notes (Signed)
Dr.on call ok to take NG out. Team to evaluate tomorrow.

## 2018-04-17 NOTE — Progress Notes (Signed)
While coughing, pt's NG tube became dislodged. Pt repositioned, tube feeds held and RN notified.

## 2018-04-18 ENCOUNTER — Inpatient Hospital Stay (HOSPITAL_COMMUNITY): Payer: Medicare Other

## 2018-04-18 DIAGNOSIS — E43 Unspecified severe protein-calorie malnutrition: Secondary | ICD-10-CM

## 2018-04-18 DIAGNOSIS — C001 Malignant neoplasm of external lower lip: Secondary | ICD-10-CM

## 2018-04-18 LAB — GLUCOSE, CAPILLARY
GLUCOSE-CAPILLARY: 100 mg/dL — AB (ref 70–99)
GLUCOSE-CAPILLARY: 129 mg/dL — AB (ref 70–99)
Glucose-Capillary: 101 mg/dL — ABNORMAL HIGH (ref 70–99)
Glucose-Capillary: 109 mg/dL — ABNORMAL HIGH (ref 70–99)
Glucose-Capillary: 115 mg/dL — ABNORMAL HIGH (ref 70–99)
Glucose-Capillary: 117 mg/dL — ABNORMAL HIGH (ref 70–99)
Glucose-Capillary: 99 mg/dL (ref 70–99)

## 2018-04-18 LAB — CBC
HCT: 31.3 % — ABNORMAL LOW (ref 39.0–52.0)
Hemoglobin: 9.8 g/dL — ABNORMAL LOW (ref 13.0–17.0)
MCH: 28.1 pg (ref 26.0–34.0)
MCHC: 31.3 g/dL (ref 30.0–36.0)
MCV: 89.7 fL (ref 80.0–100.0)
NRBC: 0 % (ref 0.0–0.2)
Platelets: 310 10*3/uL (ref 150–400)
RBC: 3.49 MIL/uL — ABNORMAL LOW (ref 4.22–5.81)
RDW: 15.9 % — ABNORMAL HIGH (ref 11.5–15.5)
WBC: 10.9 10*3/uL — ABNORMAL HIGH (ref 4.0–10.5)

## 2018-04-18 LAB — MAGNESIUM: Magnesium: 2.2 mg/dL (ref 1.7–2.4)

## 2018-04-18 LAB — PHOSPHORUS: Phosphorus: 3.6 mg/dL (ref 2.5–4.6)

## 2018-04-18 MED ORDER — SULFAMETHOXAZOLE-TRIMETHOPRIM 400-80 MG/5ML IV SOLN
10.0000 mg/kg/d | Freq: Two times a day (BID) | INTRAVENOUS | Status: DC
  Administered 2018-04-18 – 2018-04-20 (×4): 363.04 mg via INTRAVENOUS
  Filled 2018-04-18 (×4): qty 22.69

## 2018-04-18 MED ORDER — VITAL AF 1.2 CAL PO LIQD
1500.0000 mL | ORAL | Status: DC
  Filled 2018-04-18: qty 1500

## 2018-04-18 NOTE — Consult Note (Signed)
Patient Demographics  Anthony [name], is a 78 y.o. male   MRN: [id number]   DOB - 07-30-1940  Admit Date - 04/12/2018    Outpatient Primary MD for the patient is Rosita Fire, MD  Consult requested in the Hospital by Melida Quitter, MD, On 04/18/2018    Reason for consult  - medical Management - Non sustained V tach   With History of -  Past Medical History:  Diagnosis Date  . Acute pyelonephritis 12/23/2014  . Arthritis   . Asthma   . BPH (benign prostatic hyperplasia)   . Cancer ([place])    Lip cancer,has a place on the top of head  . Chronic back pain   . Chronic hip pain   . CKD (chronic kidney disease)   . COPD (chronic obstructive pulmonary disease) ([place])   . Dementia ([place])   . Depression   . Dysphagia   . GERD (gastroesophageal reflux disease)   . Headache   . History of kidney stones   . Hypertension    no longer on medication  . Kidney stone   . Neuropathy   . Pneumonia    november 2019  bilateral  . PTSD (post-traumatic stress disorder)   . Pulmonary nodules 12/25/2014  . Restless leg syndrome       Past Surgical History:  Procedure Laterality Date  . ABDOMINAL SURGERY     heria  . ESOPHAGOGASTRODUODENOSCOPY N/A 07/06/2017   Procedure: ESOPHAGOGASTRODUODENOSCOPY (EGD);  Surgeon: Rogene Houston, MD;  [facility];  Service: Endoscopy;  Laterality: N/A;  . EYE SURGERY    . FRACTURE SURGERY  2008   hip fracture with nailing  . HERNIA REPAIR    . IR CATHETER TUBE CHANGE  01/26/2018  . LESION EXCISION WITH COMPLEX REPAIR N/A 04/12/2018   Procedure: TOTAL LOWER LIP RESECTION;  Surgeon: Melida Quitter, MD;  Location: [place];  Service: ENT;  Laterality: N/A;  . RADICAL NECK DISSECTION Bilateral 04/12/2018   Procedure: Bilateral Radical Neck Dissection;  Surgeon: Melida Quitter, MD;  Location: [place];  Service: ENT;  Laterality: Bilateral;  . supubic catheter    .  TRACHEOSTOMY TUBE PLACEMENT N/A 04/12/2018   Procedure: TRACHEOSTOMY;  Surgeon: Melida Quitter, MD;  Location: [facility];  Service: ENT;  Laterality: N/A;    in for   No chief complaint on file.    HPI  Anthony [name]  is a 78 y.o. male, with past medical history of CKD stage III, hydronephrosis status post suprapubic Foley catheter, COPD, dementia, who was admitted under ENT service for new diagnosis of lower lip squamous cell carcinoma status post total lower lip resection, bilateral neck dissection, and tracheostomy by ENT/plastic surgery, will patient recently diagnosed with a scalp malignancy, OG significant for squamous cell carcinoma, with some lymph node involvement, patient was on NG tube feed, unfortunately his NGT was dislodged overnight, surgery consulted regarding PEG tube, patient was noted to have basal nonsustained V. tach earlier today, most recent echo last year with  a preserved EF, and had low-grade temperature, had positive urine analysis, darted on Bactrim, we were consulted for medical management.    Review of Systems    Patient extremely poor historian, review of system will be unreliable, especially the patient is currently nonverbal and has tracheostomy and recent surgery.    Social History Social History   Tobacco Use  . Smoking status: Former Smoker    Packs/day: 0.50    Types: Cigarettes    Last attempt to quit: 06/09/2017    Years since quitting: 0.8  . Smokeless tobacco: Never Used  Substance Use Topics  . Alcohol use: No    Family History Family History  Family history unknown: Yes  could not obtain any family history from the patient given he is non-verbal, and demented   Prior to Admission medications   Medication Sig Start Date End Date Taking? Authorizing Provider  acetaminophen (TYLENOL) 500 MG tablet Take 500 mg by mouth every 6 (six) hours as needed (pain).    Yes [provider]  aspirin EC 81 MG tablet Take 81 mg by mouth daily. (0900)    Yes [provider]  buPROPion (WELLBUTRIN) 100 MG tablet Take 100 mg by mouth daily. (0900)   Yes [provider]  famotidine (PEPCID) 10 MG tablet Take 40 mg by mouth daily. (0900)   Yes [provider]  fluticasone (FLONASE) 50 MCG/ACT nasal spray Place 1 spray into both nostrils 2 (two) times daily. (0900 & 2100)   Yes [provider]  gabapentin (NEURONTIN) 100 MG capsule Take 200 mg by mouth 3 (three) times daily. (0900, 1700, & 2100)   Yes [provider]  Lidocaine 2 % GEL Apply 1 application topically See admin instructions. Apply to lips/mouth topically before meals   Yes [provider]  memantine (NAMENDA) 5 MG tablet Take 1 tablet (5 mg total) by mouth 2 (two) times daily. Patient taking differently: Take 5 mg by mouth daily. (0900) 10/14/17  Yes Kathie Dike, MD  Multiple Vitamin (MULTIVITAMIN WITH MINERALS) TABS tablet Take 1 tablet by mouth daily. (0900)   Yes [provider]  NUTRITIONAL SUPPLEMENT LIQD Take 120 mLs by mouth 2 (two) times daily.   Yes [provider]  oxyCODONE (OXY IR/ROXICODONE) 5 MG immediate release tablet Take 5 mg by mouth every 6 (six) hours as needed (pain).   Yes [provider]  [place] (OYSTER CALCIUM) 500 MG TABS tablet Take 500 mg of elemental calcium by mouth daily. (0900)   Yes [provider]  polyethylene glycol (MIRALAX) packet Take 17 g by mouth daily. Patient taking differently: Take 17 g by mouth 2 (two) times daily.  10/14/17  Yes Kathie Dike, MD  pramipexole (MIRAPEX) 0.125 MG tablet Take 0.125 mg by mouth at bedtime. (2100)   Yes [provider]  sennosides-docusate sodium (SENOKOT-S) 8.6-50 MG tablet Take 1 tablet by mouth daily as needed for constipation.   Yes [provider]  sertraline (ZOLOFT) 50 MG tablet Take 50 mg by mouth daily. (0900)   Yes [provider]  traMADol (ULTRAM) 50 MG tablet Take 1 tablet (50 mg  total) by mouth every 6 (six) hours as needed for severe pain. Patient taking differently: Take 50 mg by mouth 2 (two) times daily.  07/07/17  Yes Johnson, Clanford L, MD  vitamin C (ASCORBIC ACID) 500 MG tablet Take 500 mg by mouth 2 (two) times daily.   Yes [provider]  Vitamin D, Ergocalciferol, (  DRISDOL) 50000 units CAPS capsule Take 50,000 Units by mouth every Friday.    Yes [provider]  albuterol (PROVENTIL HFA;VENTOLIN HFA) 108 (90 BASE) MCG/ACT inhaler Inhale 2 puffs into the lungs every 6 (six) hours as needed for wheezing or shortness of breath.     [provider]  ipratropium (ATROVENT) 0.02 % nebulizer solution Take 2.5 mLs (0.5 mg total) by nebulization every 6 (six) hours as needed for wheezing or shortness of breath. Patient not taking: Reported on 04/04/2018 07/07/17   Murlean Iba, MD  nystatin (MYCOSTATIN/NYSTOP) powder Apply topically 2 (two) times daily. Patient not taking: Reported on 04/04/2018 11/11/17   Heath Lark D, DO  pantoprazole (PROTONIX) 40 MG tablet Take 1 tablet (40 mg total) by mouth 2 (two) times daily before a meal. Patient not taking: Reported on 04/04/2018 07/07/17   Murlean Iba, MD    Anti-infectives (From admission, onward)   Start     Dose/Rate Route Frequency Ordered Stop   04/18/18 2200  sulfamethoxazole-trimethoprim (BACTRIM) 363.04 mg in dextrose 5 % 500 mL IVPB     10 mg/kg/day  72.6 kg 348.5 mL/hr over 90 Minutes Intravenous Every 12 hours 04/18/18 1711     04/17/18 2200  sulfamethoxazole-trimethoprim (BACTRIM,SEPTRA) 200-40 MG/5ML suspension 20 mL  Status:  Discontinued     20 mL Per Tube Every 12 hours 04/17/18 1844 04/18/18 1711   04/12/18 1000  ciprofloxacin (CIPRO) IVPB 400 mg     400 mg 200 mL/hr over 60 Minutes Intravenous On call to O.R. 04/12/18 0844 04/12/18 1039      Scheduled Meds: . bacitracin   Topical BID  . chlorhexidine  15 mL Mouth Rinse BID  . mouth rinse  15 mL Mouth Rinse  [id number]  . mupirocin ointment  1 application Nasal BID  . pantoprazole (PROTONIX) IV  40 mg Intravenous Q12H  . white petrolatum   Topical BID   Continuous Infusions: . dextrose 5 % and 0.45 % NaCl with KCl 20 mEq/L 25 mL/hr at 04/18/18 1019  . sulfamethoxazole-trimethoprim     PRN Meds:.morphine injection, ondansetron (ZOFRAN) IV  Allergies -Bee venom, penicillin, strawberry extract  Physical Exam  Vitals  Blood pressure (!) 94/46, pulse 85, temperature 98.2 F (36.8 C), temperature source Axillary, resp. rate 17, height 6\' 3"  (1.905 m), weight 72.6 kg, SpO2 98 %.   1. General frail, elderly thin appearing, ill-appearing male, laying in bed in no apparent distress  2.  following simple commands, answering yes and no questions  3.  Moving all extremities without focal deficits   4. Ears and Eyes appear Normal, Conjunctivae clear, PERRLA.   5.  Patient with tracheostomy, with thick yellow secretions, has surgical staples in the inferior neck area, this post lower lip surgery, appears to be totally resected, mild amount of drainage could be noted ,  6. Symmetrical Chest wall movement, air entry bilaterally, no wheezing  7. RRR, No Gallops, Rubs or Murmurs, No Parasternal Heave.  8. Positive Bowel Sounds, Abdomen Soft, No tenderness, No organomegaly appriciated,No rebound -guarding or rigidity.  9.  No Cyanosis, Normal Skin Turgor, No Skin Rash or Bruise.  10.  Significant muscle wasting joints appear normal , no effusions, Normal ROM.     Data Review  CBC Recent Labs  Lab 04/12/18 1227 04/12/18 1234  HGB 8.5* 10.9*  HCT 25.0* 32.0*   ------------------------------------------------------------------------------------------------------------------  Chemistries  Recent Labs  Lab 04/12/18 1227 04/12/18 1234 04/13/18 1856 04/14/18 0234 04/14/18 1657  NA  143 140  --   --   --   K 2.6* 3.7  --   --   --   MG  --   --  1.8 1.8 1.9    ------------------------------------------------------------------------------------------------------------------ estimated creatinine clearance is 43.6 mL/min (A) (by C-G formula based on SCr of 1.41 mg/dL (H)). ------------------------------------------------------------------------------------------------------------------ No results for input(s): TSH, T4TOTAL, T3FREE, THYROIDAB in the last 72 hours.  Invalid input(s): FREET3   Coagulation profile No results for input(s): INR, PROTIME in the last 168 hours. ------------------------------------------------------------------------------------------------------------------- No results for input(s): DDIMER in the last 72 hours. -------------------------------------------------------------------------------------------------------------------  Cardiac Enzymes No results for input(s): CKMB, TROPONINI, MYOGLOBIN in the last 168 hours.  Invalid input(s): CK ------------------------------------------------------------------------------------------------------------------ Invalid input(s): POCBNP   ---------------------------------------------------------------------------------------------------------------  Urinalysis    Component Value Date/Time   COLORURINE YELLOW 04/17/2018 1327   APPEARANCEUR HAZY (A) 04/17/2018 1327   LABSPEC 1.014 04/17/2018 1327   PHURINE 7.0 04/17/2018 1327   GLUCOSEU NEGATIVE 04/17/2018 1327   HGBUR MODERATE (A) 04/17/2018 1327   BILIRUBINUR NEGATIVE 04/17/2018 1327   KETONESUR NEGATIVE 04/17/2018 1327   PROTEINUR 30 (A) 04/17/2018 1327   UROBILINOGEN 1.0 12/23/2014 1853   NITRITE POSITIVE (A) 04/17/2018 1327   LEUKOCYTESUR MODERATE (A) 04/17/2018 1327     Imaging results:   Dg Chest 2 View  Result Date: 04/17/2018 CLINICAL DATA:  Fever. EXAM: CHEST - 2 VIEW COMPARISON:  Abdominal x-ray dated April 13, 2018. FINDINGS: Tracheostomy tube in good position with the tip at the thoracic inlet.  Interval retraction of the enteric tube with the tip in the lower esophagus. The heart size and mediastinal contours are within normal limits. Normal pulmonary vascularity. Mildly coarsened interstitial markings are likely smoking-related. The lungs are mildly hyperinflated with emphysematous changes. 11 mm nodular density in the peripheral right mid lung. No focal consolidation, pleural effusion, or pneumothorax. No acute osseous abnormality. IMPRESSION: 1.  No active cardiopulmonary disease. 2. Interval retraction of the enteric tube with the tip now in the lower esophagus. Recommend advancement. 3. 11 mm nodular density in the peripheral right mid lung. Recommend non-emergent chest CT for further evaluation. 4. COPD. Electronically Signed   By: Titus Dubin M.D.   On: 04/17/2018 10:54    Repeat  EKG this admission is pending    Assessment & Plan  Active Problems:   Tracheostomy in place [facility])   Cancer of lower lip   Protein-calorie malnutrition, severe   Non sustained V. Tach -Patient had 12 beats of nonsustained V. tach earlier today on telemetry no recurrence, asymptomatic,(he had another readings mentioning A. fib on telemetry, but they do appear to be sinus arrhythmias, and not A. Fib) -Patient had recent 2D echo in 2019, with preserved EF and no regional wall motion abnormalities -I will check electrolytes including potassium and magnesium and replete if needed -Like to start a low-dose beta-blocker, but blood pressure on the lower side, so I will continue with telemetry monitor for now only  Squamous cell carcinoma of lower lip -Management per primary ENT team, status post lower lip resection, tracheostomy, with bilateral radical neck dissection, with lower lip reconstruction with tissue rearrangement with regional flap done by ENT and plastic surgery. -Management per primary team -Would recommend DVT prophylaxis subcu heparin or Lovenox once cleared by primary team  Scalp  Malignancy -With large growth in the scalp area, it is malignant as discussed with ENT, for surgery later this week.  Lung nodule -Noted on chest x-ray, will obtain CT chest for further evaluation  Low-grade fever -Temperature 99.8, will obtain CBC to check leukocyte count, he has positive urine analysis, but this would be expected with suprapubic Foley catheter, I have instructed staff to change his suprapubic Foley, will follow on urine cultures. -Patient with thick secretion from trach, will send tracheal aspirate for culture, as may be having tracheitis causing his fever. -Meanwhile continue with Bactrim(currently IV, as no source for oral intake)  Protein calorie malnutrition, severe -Continues to be on tube feed, unfortunately it was dislodged overnight, currently he is n.p.o., on IV fluids, general surgery was consulted for PEG tube placement.  CKD stage III -We will repeat BMP to ensure stable renal function, avoid nephrotoxic medications     DVT Prophylaxis SCDs (will start chemical DVT prophylaxis once cleared by ENT)  AM Labs Ordered, also please review Full Orders   Thank you for the consult, we will follow the patient with you in the Hospital.   Phillips Climes M.D on 04/18/2018 at 5:37 PM  Between 7am to 7pm - Pager - [phone]  After 7pm go to [email or web address] - password [email or web address]   Thank you for the consult, we will follow the patient with you in the [facility]   Office  [phone]

## 2018-04-18 NOTE — Progress Notes (Signed)
   Subjective:    Patient ID: Anthony [name], male    DOB: January 18, 1941, 78 y.o.   MRN: [id number]  HPI NG tube came out last night.  Started on Bactrim last night for abnormal U/A.  Urine culture ordered.  Otherwise, no complaints.  Review of Systems     Objective:   Physical Exam Tm 99.4  VSS Alert, NAD Some breakdown of central lower lip reconstruction Neck incision clean and intact, no fluid collection Trach site stable with cuffless trach in place    Assessment & Plan:  Lip cancer s/p resection, reconstruction, tracheostomy, and neck dissections  Plastics input on lip reconstruction.  With NG out and lower lip wound with some breakdown, we will likely move forward with PEG placement.

## 2018-04-18 NOTE — Progress Notes (Signed)
Physical Therapy Treatment Patient Details Name: Anthony [name] MRN: [id number] DOB: Apr 25, 1940 Today's Date: 04/18/2018    History of Present Illness Admitted for management of squamous cell carcinoma of lip; now s/p total lower lip resection, tracheostomy, Reconstruction of lower lip defect, bilateral radical neck dissection due to enlarged nodes in both superior necks;  has a pertinent past medical history of Acute pyelonephritis (12/23/2014), Arthritis, Asthma, BPH (benign prostatic hyperplasia), Cancer ([place]), Chronic back pain, Chronic hip pain, CKD (chronic kidney disease), COPD (chronic obstructive pulmonary disease) ([place]), Dementia ([place]), Depression, Dysphagia, GERD (gastroesophageal reflux disease), Headache, History of kidney stones, Hypertension, Kidney stone, Neuropathy, Pneumonia, PTSD (post-traumatic stress disorder), Pulmonary nodules (12/25/2014), and Restless leg syndrome.    PT Comments    Pt was seen for mobility with Passy Muir valve in place, able to have more conversation with PT and OT regarding the work of getting up to stand and sidestepping to the chair.  He is motivated and looking forward to eating and getting home.  Follow acutely as planned, continue to work on standing control, which currently is hindered by stiffness of chronic back pain as much as LE strength.   Follow Up Recommendations  SNF     Equipment Recommendations  None recommended by PT(determine based on progress in SNF)    Recommendations for Other Services       Precautions / Restrictions Precautions Precautions: Fall;Other (comment)(trach) Restrictions Weight Bearing Restrictions: No    Mobility  Bed Mobility Overal bed mobility: Needs Assistance Bed Mobility: Sidelying to Sit   Sidelying to sit: Mod assist;HOB elevated       General bed mobility comments: vc to scoot to EOB and get feet flat on the ground;modA to progress trunk to upright position  Transfers Overall transfer level:  Needs assistance Equipment used: Rolling walker (2 wheeled);2 person hand held assist Transfers: Sit to/from [facility] Sit to Stand: Mod assist;+2 physical assistance;+2 safety/equipment Stand pivot transfers: Mod assist;+2 physical assistance;+2 safety/equipment       General transfer comment: requires +2 modA for powerup and stability;   Ambulation/Gait Ambulation/Gait assistance: Mod assist;+2 physical assistance;+2 safety/equipment Gait Distance (Feet): 4 Feet Assistive device: Rolling walker (2 wheeled);2 person hand held assist Gait Pattern/deviations: Step-to pattern;Decreased stride length;Wide base of support;Trunk flexed Gait velocity: reduced Gait velocity interpretation: <1.8 ft/sec, indicate of risk for recurrent falls General Gait Details: pt is taking very halting short steps to maneuver on the walker   Stairs             Wheelchair Mobility    Modified Rankin (Stroke Patients Only)       Balance Overall balance assessment: Needs assistance Sitting-balance support: Feet supported Sitting balance-Leahy Scale: Fair Sitting balance - Comments: adjusted socks while sitting EOB with minguardA for stability   Standing balance support: Bilateral upper extremity supported Standing balance-Leahy Scale: Zero Standing balance comment: pt requires +2modA for physical assistance and heavy reliance on BUE on RW                            Cognition Arousal/Alertness: Awake/alert Behavior During Therapy: [facility] for tasks assessed/performed Overall Cognitive Status: No family/caregiver present to determine baseline cognitive functioning                                 General Comments: pt engaged conversationally, with comedic input;pt appeared motivated to participate during  session       Exercises      General Comments General comments (skin integrity, edema, etc.): O2 sats were controlled and 98% or better,        Pertinent Vitals/Pain Pain Assessment: Faces Faces Pain Scale: Hurts even more Pain Location: back Pain Descriptors / Indicators: Grimacing Pain Intervention(s): Monitored during session;Repositioned    Home Living                      Prior Function            PT Goals (current goals can now be found in the care plan section) Acute Rehab PT Goals Patient Stated Goal: to eat a cheeseburger Progress towards PT goals: Progressing toward goals    Frequency    Min 2X/week      PT Plan Current plan remains appropriate    Co-evaluation   Reason for Co-Treatment: For patient/therapist safety;To address functional/ADL transfers PT goals addressed during session: Mobility/safety with mobility;Balance;Proper use of DME OT goals addressed during session: ADL's and self-care      AM-PAC PT "6 Clicks" Mobility   Outcome Measure  Help needed turning from your back to your side while in a flat bed without using bedrails?: A Little Help needed moving from lying on your back to sitting on the side of a flat bed without using bedrails?: A Lot Help needed moving to and from a bed to a chair (including a wheelchair)?: A Lot Help needed standing up from a chair using your arms (e.g., wheelchair or bedside chair)?: A Lot Help needed to walk in hospital room?: A Lot Help needed climbing 3-5 steps with a railing? : Total 6 Click Score: 12    End of Session Equipment Utilized During Treatment: Oxygen Activity Tolerance: Patient limited by fatigue Patient left: in chair;with call bell/phone within reach Nurse Communication: Mobility status PT Visit Diagnosis: Muscle weakness (generalized) (M62.81);Other abnormalities of gait and mobility (R26.89)     Time: 1610-9604 PT Time Calculation (min) (ACUTE ONLY): 22 min  Charges:  $Neuromuscular Re-education: 8-22 mins                    Ramond Dial 04/18/2018, 12:28 PM  Mee Hives, PT MS Acute Rehab Dept. Number: [place] [facility] [place]

## 2018-04-18 NOTE — Progress Notes (Signed)
Occupational Therapy Treatment Patient Details Name: Anthony [name] MRN: [id number] DOB: 23-Aug-1940 Today's Date: 04/18/2018    History of present illness Admitted for management of squamous cell carcinoma of lip; now s/p total lower lip resection, tracheostomy, Reconstruction of lower lip defect, bilateral radical neck dissection due to enlarged nodes in both superior necks;  has a pertinent past medical history of Acute pyelonephritis (12/23/2014), Arthritis, Asthma, BPH (benign prostatic hyperplasia), Cancer ([place]), Chronic back pain, Chronic hip pain, CKD (chronic kidney disease), COPD (chronic obstructive pulmonary disease) ([place]), Dementia ([place]), Depression, Dysphagia, GERD (gastroesophageal reflux disease), Headache, History of kidney stones, Hypertension, Kidney stone, Neuropathy, Pneumonia, PTSD (post-traumatic stress disorder), Pulmonary nodules (12/25/2014), and Restless leg syndrome.   OT comments  Pt progressing toward established OT goals. Pt currently requires modA +2 for functional mobility with ADL. Pt required modA for LB dressing. Pt was motivated to participate during session and appeared to be in good spirits, engaging in conversation and joking appropriately. Pt will continue to benefit from skilled OT services to maximize safety and independence with ADL and functional mobility to allow d/c to venue listed below. Will continue to follow acutely.    Follow Up Recommendations  SNF    Equipment Recommendations  Other (comment)(defer to next venue)    Recommendations for Other Services      Precautions / Restrictions Precautions Precautions: Fall;Other (comment)(trach)       Mobility Bed Mobility Overal bed mobility: Needs Assistance Bed Mobility: Sidelying to Sit   Sidelying to sit: Mod assist;HOB elevated       General bed mobility comments: vc to scoot to EOB and get feet flat on the ground;modA to progress trunk to upright position  Transfers Overall transfer  level: Needs assistance Equipment used: Rolling walker (2 wheeled);2 person hand held assist Transfers: Sit to/from [facility] Sit to Stand: Mod assist;+2 physical assistance;+2 safety/equipment Stand pivot transfers: Mod assist;+2 physical assistance;+2 safety/equipment       General transfer comment: requires +2 modA for powerup and stability;     Balance Overall balance assessment: Needs assistance Sitting-balance support: Feet supported Sitting balance-Leahy Scale: Fair Sitting balance - Comments: adjusted socks while sitting EOB with minguardA for stability   Standing balance support: Bilateral upper extremity supported Standing balance-Leahy Scale: Zero Standing balance comment: pt requires +60modA for physical assistance and heavy reliance on BUE on RW                           ADL either performed or assessed with clinical judgement   ADL Overall ADL's : Needs assistance/impaired Eating/Feeding: NPO   Grooming: Set up;Sitting Grooming Details (indicate cue type and reason): pt used suction after setupA              Lower Body Dressing: Moderate assistance;Sit to/from stand Lower Body Dressing Details (indicate cue type and reason): pt able to adjust socks while sitting EOB Toilet Transfer: Maximal assistance;+2 for physical assistance;+2 for safety/equipment;Stand-pivot;Cueing for safety;RW Toilet Transfer Details (indicate cue type and reason): vc for safe hand placement and to maintain upright posture while standing and during transfer;simulated toilet transfer from EOB to recliner         Functional mobility during ADLs: Moderate assistance;+2 for physical assistance;+2 for safety/equipment;Rolling walker General ADL Comments: pt quickly fatigues with transferring from EOB to Experiment  Arousal/Alertness: Awake/alert Behavior During Therapy: WFL for tasks  assessed/performed Overall Cognitive Status: No family/caregiver present to determine baseline cognitive functioning                                 General Comments: pt engaged conversationally, with comedic input;pt appeared motivated to participate during session         Exercises     Shoulder Instructions       General Comments VSS throughout session;pt used PMSV under supervision    Pertinent Vitals/ Pain       Pain Assessment: Faces Faces Pain Scale: Hurts even more Pain Location: back Pain Descriptors / Indicators: Grimacing Pain Intervention(s): Monitored during session  Home Living                                          Prior Functioning/Environment              Frequency  Min 2X/week        Progress Toward Goals  OT Goals(current goals can now be found in the care plan section)  Progress towards OT goals: Progressing toward goals  Acute Rehab OT Goals Patient Stated Goal: to eat a cheeseburger OT Goal Formulation: With patient Time For Goal Achievement: 04/30/18 Potential to Achieve Goals: Good ADL Goals Pt Will Perform Upper Body Bathing: with min assist;sitting Pt Will Perform Lower Body Bathing: with mod assist;sit to/from stand Pt Will Perform Upper Body Dressing: with min assist;sitting Pt Will Perform Lower Body Dressing: with mod assist;sit to/from stand Pt Will Transfer to Toilet: ambulating;with min assist Pt Will Perform Toileting - Clothing Manipulation and hygiene: with min assist;sit to/from stand  Plan Discharge plan remains appropriate    Co-evaluation    PT/OT/SLP Co-Evaluation/Treatment: Yes Reason for Co-Treatment: For patient/therapist safety;To address functional/ADL transfers PT goals addressed during session: Mobility/safety with mobility;Proper use of DME OT goals addressed during session: ADL's and self-care      AM-PAC OT "6 Clicks" Daily Activity     Outcome Measure   Help from  another person eating meals?: Total Help from another person taking care of personal grooming?: A Lot Help from another person toileting, which includes using toliet, bedpan, or urinal?: A Lot Help from another person bathing (including washing, rinsing, drying)?: A Lot Help from another person to put on and taking off regular upper body clothing?: A Lot Help from another person to put on and taking off regular lower body clothing?: A Lot 6 Click Score: 11    End of Session Equipment Utilized During Treatment: Rolling walker;Oxygen  OT Visit Diagnosis: Unsteadiness on feet (R26.81);Muscle weakness (generalized) (M62.81);Pain Pain - part of body: (back)   Activity Tolerance Patient limited by fatigue   Patient Left in chair;with call bell/phone within reach;with chair alarm set   Nurse Communication Mobility status        Time: 1638-4536 OT Time Calculation (min): 21 min  Charges: OT General Charges $OT Visit: 1 Visit OT Treatments $Self Care/Home Management : 8-22 mins  Dorinda Hill OTR/L [place] Office: [place] 04/18/2018, 11:54 AM

## 2018-04-18 NOTE — Consult Note (Signed)
[facility] Surgery Consult/Admission Note  Anthony [name] [birth year]  [id number].    Requesting MD: Dr. Redmond Baseman Chief Complaint/Reason for Consult: PEG  HPI:   Pt is a 78 yo male with a hx of SSC of his lower lip, s/p total lower lip resection, b/l neck dissections and tracheostomy on 01/29. We have asked to see for PEG for nutrition. Pt states no hx of abdominal surgeries. He states pain of his lower jaw/lip area. He is on trach collar and he did not have his passy-muir valve in at the time of my exam. Pt is currently not on blood thinners.   ROS:  Review of Systems  Constitutional: Negative for chills, diaphoresis and fever.  HENT: Negative for sore throat.        + for lower lip/jaw pain and swelling  Respiratory: Negative for cough and shortness of breath.   Cardiovascular: Negative for chest pain.  Gastrointestinal: Negative for abdominal pain, blood in stool, constipation, diarrhea, nausea and vomiting.  Genitourinary: Negative for dysuria.  Skin: Negative for rash.  Neurological: Negative for dizziness and loss of consciousness.  All other systems reviewed and are negative.    Family History  Family history unknown: Yes    Past Medical History:  Diagnosis Date  . Acute pyelonephritis 12/23/2014  . Arthritis   . Asthma   . BPH (benign prostatic hyperplasia)   . Cancer ([place])    Lip cancer,has a place on the top of head  . Chronic back pain   . Chronic hip pain   . CKD (chronic kidney disease)   . COPD (chronic obstructive pulmonary disease) ([place])   . Dementia ([place])   . Depression   . Dysphagia   . GERD (gastroesophageal reflux disease)   . Headache   . History of kidney stones   . Hypertension    no longer on medication  . Kidney stone   . Neuropathy   . Pneumonia    november 2019  bilateral  . PTSD (post-traumatic stress disorder)   . Pulmonary nodules 12/25/2014  . Restless leg syndrome     Past Surgical History:  Procedure Laterality Date  .  ABDOMINAL SURGERY     heria  . ESOPHAGOGASTRODUODENOSCOPY N/A 07/06/2017   Procedure: ESOPHAGOGASTRODUODENOSCOPY (EGD);  Surgeon: Rogene Houston, MD;  [facility];  Service: Endoscopy;  Laterality: N/A;  . EYE SURGERY    . FRACTURE SURGERY  2008   hip fracture with nailing  . HERNIA REPAIR    . IR CATHETER TUBE CHANGE  01/26/2018  . LESION EXCISION WITH COMPLEX REPAIR N/A 04/12/2018   Procedure: TOTAL LOWER LIP RESECTION;  Surgeon: Melida Quitter, MD;  Location: [place];  Service: ENT;  Laterality: N/A;  . RADICAL NECK DISSECTION Bilateral 04/12/2018   Procedure: Bilateral Radical Neck Dissection;  Surgeon: Melida Quitter, MD;  Location: [place];  Service: ENT;  Laterality: Bilateral;  . supubic catheter    . TRACHEOSTOMY TUBE PLACEMENT N/A 04/12/2018   Procedure: TRACHEOSTOMY;  Surgeon: Melida Quitter, MD;  Location: [place];  Service: ENT;  Laterality: N/A;    Social History:  reports that he quit smoking about 10 months ago. His smoking use included cigarettes. He smoked 0.50 packs per day. He has never used smokeless tobacco. He reports that he does not drink alcohol or use drugs.   Medications Prior to Admission  Medication Sig Dispense Refill  . acetaminophen (TYLENOL) 500 MG tablet Take 500 mg by mouth every 6 (six) hours  as needed (pain).     Marland Kitchen aspirin EC 81 MG tablet Take 81 mg by mouth daily. (0900)    . buPROPion (WELLBUTRIN) 100 MG tablet Take 100 mg by mouth daily. (0900)    . famotidine (PEPCID) 10 MG tablet Take 40 mg by mouth daily. (0900)    . fluticasone (FLONASE) 50 MCG/ACT nasal spray Place 1 spray into both nostrils 2 (two) times daily. (0900 & 2100)    . gabapentin (NEURONTIN) 100 MG capsule Take 200 mg by mouth 3 (three) times daily. (0900, 1700, & 2100)    . Lidocaine 2 % GEL Apply 1 application topically See admin instructions. Apply to lips/mouth topically before meals    . memantine (NAMENDA) 5 MG tablet Take 1 tablet (5 mg total) by mouth 2 (two) times  daily. (Patient taking differently: Take 5 mg by mouth daily. (0900))    . Multiple Vitamin (MULTIVITAMIN WITH MINERALS) TABS tablet Take 1 tablet by mouth daily. (0900)    . NUTRITIONAL SUPPLEMENT LIQD Take 120 mLs by mouth 2 (two) times daily.    Marland Kitchen oxyCODONE (OXY IR/ROXICODONE) 5 MG immediate release tablet Take 5 mg by mouth every 6 (six) hours as needed (pain).    [place] (OYSTER CALCIUM) 500 MG TABS tablet Take 500 mg of elemental calcium by mouth daily. (0900)    . polyethylene glycol (MIRALAX) packet Take 17 g by mouth daily. (Patient taking differently: Take 17 g by mouth 2 (two) times daily. ) 14 each 0  . pramipexole (MIRAPEX) 0.125 MG tablet Take 0.125 mg by mouth at bedtime. (2100)    . sennosides-docusate sodium (SENOKOT-S) 8.6-50 MG tablet Take 1 tablet by mouth daily as needed for constipation.    . sertraline (ZOLOFT) 50 MG tablet Take 50 mg by mouth daily. (0900)    . traMADol (ULTRAM) 50 MG tablet Take 1 tablet (50 mg total) by mouth every 6 (six) hours as needed for severe pain. (Patient taking differently: Take 50 mg by mouth 2 (two) times daily. ) 10 tablet 0  . vitamin C (ASCORBIC ACID) 500 MG tablet Take 500 mg by mouth 2 (two) times daily.    . Vitamin D, Ergocalciferol, (DRISDOL) 50000 units CAPS capsule Take 50,000 Units by mouth every Friday.     Marland Kitchen albuterol (PROVENTIL HFA;VENTOLIN HFA) 108 (90 BASE) MCG/ACT inhaler Inhale 2 puffs into the lungs every 6 (six) hours as needed for wheezing or shortness of breath.     Marland Kitchen ipratropium (ATROVENT) 0.02 % nebulizer solution Take 2.5 mLs (0.5 mg total) by nebulization every 6 (six) hours as needed for wheezing or shortness of breath. (Patient not taking: Reported on 04/04/2018) 75 mL 12  . nystatin (MYCOSTATIN/NYSTOP) powder Apply topically 2 (two) times daily. (Patient not taking: Reported on 04/04/2018) 15 g 0  . pantoprazole (PROTONIX) 40 MG tablet Take 1 tablet (40 mg total) by mouth 2 (two) times daily before a meal.  (Patient not taking: Reported on 04/04/2018)      Blood pressure 104/64, pulse 87, temperature 98 F (36.7 C), temperature source Axillary, resp. rate 20, height 6\' 3"  (1.905 m), weight 72.6 kg, SpO2 100 %.  Physical Exam Vitals signs and nursing note reviewed.  Constitutional:      General: He is not in acute distress.    Appearance: Normal appearance. He is underweight. He is ill-appearing. He is not diaphoretic.  HENT:     Head: Normocephalic.     Nose: Nose normal.  Mouth/Throat:     Mouth: Mucous membranes are moist.     Comments: Edema to lower jaw with staples intact, incisions to lower lip, pt unable to open mouth more than roughly 2.5 cm Eyes:     General: No scleral icterus.       Right eye: No discharge.        Left eye: No discharge.     Conjunctiva/sclera: Conjunctivae normal.     Comments: Pupils are equal and round  Neck:     Comments: Trach in place and on trach collar Cardiovascular:     Rate and Rhythm: Normal rate and regular rhythm.     Pulses:          Radial pulses are 2+ on the right side and 2+ on the left side.     Heart sounds: S1 normal and S2 normal.  Pulmonary:     Effort: Pulmonary effort is normal. No respiratory distress.     Breath sounds: Normal breath sounds. No wheezing, rhonchi or rales.  Abdominal:     General: Bowel sounds are normal. There is no distension.     Palpations: Abdomen is soft. Abdomen is not rigid.     Tenderness: There is no abdominal tenderness. There is no guarding.  Musculoskeletal: Normal range of motion.        General: No tenderness or deformity.  Skin:    General: Skin is warm and dry.     Findings: No rash.  Neurological:     Mental Status: He is alert and oriented to person, place, and time.  Psychiatric:        Mood and Affect: Mood normal.        Behavior: Behavior normal.     Results for orders placed or performed during the hospital encounter of 04/12/18 (from the past 48 hour(s))  Glucose,  capillary     Status: Abnormal   Collection Time: 04/16/18  5:09 PM  Result Value Ref Range   Glucose-Capillary 144 (H) 70 - 99 mg/dL  Glucose, capillary     Status: Abnormal   Collection Time: 04/16/18  7:34 PM  Result Value Ref Range   Glucose-Capillary 126 (H) 70 - 99 mg/dL  Glucose, capillary     Status: Abnormal   Collection Time: 04/17/18 12:25 AM  Result Value Ref Range   Glucose-Capillary 120 (H) 70 - 99 mg/dL  Glucose, capillary     Status: Abnormal   Collection Time: 04/17/18  3:49 AM  Result Value Ref Range   Glucose-Capillary 133 (H) 70 - 99 mg/dL  Glucose, capillary     Status: Abnormal   Collection Time: 04/17/18  7:29 AM  Result Value Ref Range   Glucose-Capillary 135 (H) 70 - 99 mg/dL  Glucose, capillary     Status: Abnormal   Collection Time: 04/17/18 12:31 PM  Result Value Ref Range   Glucose-Capillary 136 (H) 70 - 99 mg/dL  Urinalysis, Routine w reflex microscopic     Status: Abnormal   Collection Time: 04/17/18  1:27 PM  Result Value Ref Range   Color, Urine YELLOW YELLOW   APPearance HAZY (A) CLEAR   Specific Gravity, Urine 1.014 1.005 - 1.030   pH 7.0 5.0 - 8.0   Glucose, UA NEGATIVE NEGATIVE mg/dL   Hgb urine dipstick MODERATE (A) NEGATIVE   Bilirubin Urine NEGATIVE NEGATIVE   Ketones, ur NEGATIVE NEGATIVE mg/dL   Protein, ur 30 (A) NEGATIVE mg/dL   Nitrite POSITIVE (A) NEGATIVE   Leukocytes,  UA MODERATE (A) NEGATIVE   RBC / HPF 0-5 0 - 5 RBC/hpf   WBC, UA 11-20 0 - 5 WBC/hpf   Bacteria, UA FEW (A) NONE SEEN   Squamous Epithelial / LPF 0-5 0 - 5    Comment: Performed at [facility], [place] [address], [place], [place] [address]  Glucose, capillary     Status: Abnormal   Collection Time: 04/17/18  5:04 PM  Result Value Ref Range   Glucose-Capillary 133 (H) 70 - 99 mg/dL  Glucose, capillary     Status: Abnormal   Collection Time: 04/17/18  7:23 PM  Result Value Ref Range   Glucose-Capillary 127 (H) 70 - 99 mg/dL  Glucose, capillary      Status: Abnormal   Collection Time: 04/17/18 11:58 PM  Result Value Ref Range   Glucose-Capillary 101 (H) 70 - 99 mg/dL  Glucose, capillary     Status: Abnormal   Collection Time: 04/18/18  3:46 AM  Result Value Ref Range   Glucose-Capillary 117 (H) 70 - 99 mg/dL  Glucose, capillary     Status: Abnormal   Collection Time: 04/18/18  8:23 AM  Result Value Ref Range   Glucose-Capillary 115 (H) 70 - 99 mg/dL  Glucose, capillary     Status: Abnormal   Collection Time: 04/18/18 11:57 AM  Result Value Ref Range   Glucose-Capillary 109 (H) 70 - 99 mg/dL   Dg Chest 2 View  Result Date: 04/17/2018 CLINICAL DATA:  Fever. EXAM: CHEST - 2 VIEW COMPARISON:  Abdominal x-ray dated April 13, 2018. FINDINGS: Tracheostomy tube in good position with the tip at the thoracic inlet. Interval retraction of the enteric tube with the tip in the lower esophagus. The heart size and mediastinal contours are within normal limits. Normal pulmonary vascularity. Mildly coarsened interstitial markings are likely smoking-related. The lungs are mildly hyperinflated with emphysematous changes. 11 mm nodular density in the peripheral right mid lung. No focal consolidation, pleural effusion, or pneumothorax. No acute osseous abnormality. IMPRESSION: 1.  No active cardiopulmonary disease. 2. Interval retraction of the enteric tube with the tip now in the lower esophagus. Recommend advancement. 3. 11 mm nodular density in the peripheral right mid lung. Recommend non-emergent chest CT for further evaluation. 4. COPD. Electronically Signed   By: Titus Dubin M.D.   On: 04/17/2018 10:54      Assessment/Plan Active Problems:   Tracheostomy in place [facility])   Cancer of lower lip   Protein-calorie malnutrition, severe   PEG - surgery team available for PEG placement this week, will discuss timing between all specialities involved  Date and time TBD. Thank you for the consult.    Kalman Drape, [facility]  Surgery 04/18/2018, 2:29 PM Pager: [phone] Consults: [phone] Mon-Fri 7:00 am-4:30 pm Sat-Sun 7:00 am-11:30 am

## 2018-04-18 NOTE — Evaluation (Signed)
Passy-Muir Speaking Valve - Evaluation Patient Details  Name: Anthony [name] MRN: [id number] Date of Birth: 21-Aug-1940  Today's Date: 04/18/2018 Time: 1050-1115 SLP Time Calculation (min) (ACUTE ONLY): 25 min  Past Medical History:  Past Medical History:  Diagnosis Date  . Acute pyelonephritis 12/23/2014  . Arthritis   . Asthma   . BPH (benign prostatic hyperplasia)   . Cancer ([place])    Lip cancer,has a place on the top of head  . Chronic back pain   . Chronic hip pain   . CKD (chronic kidney disease)   . COPD (chronic obstructive pulmonary disease) ([place])   . Dementia ([place])   . Depression   . Dysphagia   . GERD (gastroesophageal reflux disease)   . Headache   . History of kidney stones   . Hypertension    no longer on medication  . Kidney stone   . Neuropathy   . Pneumonia    november 2019  bilateral  . PTSD (post-traumatic stress disorder)   . Pulmonary nodules 12/25/2014  . Restless leg syndrome    Past Surgical History:  Past Surgical History:  Procedure Laterality Date  . ABDOMINAL SURGERY     heria  . ESOPHAGOGASTRODUODENOSCOPY N/A 07/06/2017   Procedure: ESOPHAGOGASTRODUODENOSCOPY (EGD);  Surgeon: Rogene Houston, MD;  [facility];  Service: Endoscopy;  Laterality: N/A;  . EYE SURGERY    . FRACTURE SURGERY  2008   hip fracture with nailing  . HERNIA REPAIR    . IR CATHETER TUBE CHANGE  01/26/2018  . LESION EXCISION WITH COMPLEX REPAIR N/A 04/12/2018   Procedure: TOTAL LOWER LIP RESECTION;  Surgeon: Melida Quitter, MD;  Location: [place];  Service: ENT;  Laterality: N/A;  . RADICAL NECK DISSECTION Bilateral 04/12/2018   Procedure: Bilateral Radical Neck Dissection;  Surgeon: Melida Quitter, MD;  Location: [place];  Service: ENT;  Laterality: Bilateral;  . supubic catheter    . TRACHEOSTOMY TUBE PLACEMENT N/A 04/12/2018   Procedure: TRACHEOSTOMY;  Surgeon: Melida Quitter, MD;  [facility]: [facility];  Service: ENT;  Laterality: N/A;   HPI:   The patient is a  78 year old male who has had a sizable tumor grow on the lower lip over the last several months that is biopsy-proven squamous cell carcinoma.  CT imaging demonstrates enlarged lymph nodes in both upper necks. Pt underwent Total lower lip resection, bilateral selective neck dissections and tracheostomy on 1/29. Plastic surgery reconstructed lower lip after resection. Pt to continue alternative method of nutrition until cleared by plastic.    Assessment / Plan / Recommendation Clinical Impression  Pt demonstrates excellent tolerance of PMSV with immediate redirection of air to upper airway. Pt wore PMSV for over 20 minutes during mobility activity with PT/OT with no change in vital signs or evidence of airtrapping. Mod to max cues needed through session to encourage pt to communicate verbally rather than using gestures. Vocal quality hoarse, slightly wet. Pt able to articulate relatively well despite labial impairment. Typically with repetition pt intelligible at word level. Will f/u for advancement to independent PMSV use, but for today, full supervision to monitor for ongoing tolerance given thick secretions.  SLP Visit Diagnosis: Dysarthria and anarthria (R47.1);Dysphagia, unspecified (R13.10)    SLP Assessment       Follow Up Recommendations  Inpatient Rehab    Frequency and Duration min 2x/week  2 weeks    PMSV Trial PMSV was placed for: 20 minutes Able to redirect subglottic air through upper airway: Yes  Able to Attain Phonation: Yes Voice Quality: Hoarse;Wet Able to Expectorate Secretions: Yes Level of Secretion Expectoration with PMSV: Oral Breath Support for Phonation: Mildly decreased Intelligibility: Intelligibility reduced Word: 50-74% accurate Phrase: 50-74% accurate Sentence: Not tested Conversation: Not tested Respirations During Trial: 19 SpO2 During Trial: (100)   Tracheostomy Tube  Additional Tracheostomy Tube Assessment Trach Collar Period: all waking  hours Secretion Description: thick white/yello Level of Secretion Expectoration: Tracheal;Oral    Vent Dependency  Vent Dependent: No FiO2 (%): 28 %    Cuff Deflation Trial  GO Herbie Baltimore, MA CCC-SLP  Acute Rehabilitation Services Pager [phone] Office [phone]  Tolerated Cuff Deflation: (cuffless)        Nyna Chilton, Katherene Ponto 04/18/2018, 11:37 AM

## 2018-04-18 NOTE — Care Management Important Message (Signed)
Important Message  Patient Details  Name: Anthony [name] MRN: [id number] Date of Birth: 01-10-1941   Medicare Important Message Given:  Yes    Zenon Mayo, RN 04/18/2018, 4:45 PM

## 2018-04-19 DIAGNOSIS — C009 Malignant neoplasm of lip, unspecified: Secondary | ICD-10-CM

## 2018-04-19 LAB — BASIC METABOLIC PANEL
Anion gap: 10 (ref 5–15)
BUN: 32 mg/dL — ABNORMAL HIGH (ref 8–23)
CO2: 22 mmol/L (ref 22–32)
Calcium: 8.6 mg/dL — ABNORMAL LOW (ref 8.9–10.3)
Chloride: 106 mmol/L (ref 98–111)
Creatinine, Ser: 1.35 mg/dL — ABNORMAL HIGH (ref 0.61–1.24)
GFR calc Af Amer: 57 mL/min — ABNORMAL LOW (ref >60)
GFR calc non Af Amer: 50 mL/min — ABNORMAL LOW (ref >60)
Glucose, Bld: 119 mg/dL — ABNORMAL HIGH (ref 70–99)
Potassium: 3.5 mmol/L (ref 3.5–5.1)
SODIUM: 138 mmol/L (ref 135–145)

## 2018-04-19 LAB — GLUCOSE, CAPILLARY
GLUCOSE-CAPILLARY: 93 mg/dL (ref 70–99)
Glucose-Capillary: 103 mg/dL — ABNORMAL HIGH (ref 70–99)
Glucose-Capillary: 110 mg/dL — ABNORMAL HIGH (ref 70–99)
Glucose-Capillary: 119 mg/dL — ABNORMAL HIGH (ref 70–99)
Glucose-Capillary: 124 mg/dL — ABNORMAL HIGH (ref 70–99)
Glucose-Capillary: 84 mg/dL (ref 70–99)

## 2018-04-19 MED ORDER — POTASSIUM CHLORIDE 10 MEQ/100ML IV SOLN
10.0000 meq | INTRAVENOUS | Status: AC
  Administered 2018-04-19 (×3): 10 meq via INTRAVENOUS

## 2018-04-19 MED ORDER — CHLORHEXIDINE GLUCONATE CLOTH 2 % EX PADS
6.0000 | MEDICATED_PAD | Freq: Once | CUTANEOUS | Status: AC
  Administered 2018-04-20: 6 via TOPICAL

## 2018-04-19 MED ORDER — CHLORHEXIDINE GLUCONATE CLOTH 2 % EX PADS
6.0000 | MEDICATED_PAD | Freq: Once | CUTANEOUS | Status: AC
  Administered 2018-04-19: 6 via TOPICAL

## 2018-04-19 MED ORDER — CIPROFLOXACIN IN D5W 400 MG/200ML IV SOLN
400.0000 mg | INTRAVENOUS | Status: AC
  Administered 2018-04-20: 400 mg via INTRAVENOUS
  Filled 2018-04-19: qty 200

## 2018-04-19 MED ORDER — POTASSIUM CHLORIDE 10 MEQ/100ML IV SOLN
10.0000 meq | INTRAVENOUS | Status: AC
  Filled 2018-04-19 (×2): qty 100

## 2018-04-19 MED ORDER — SODIUM CHLORIDE 0.9 % IV BOLUS
250.0000 mL | Freq: Once | INTRAVENOUS | Status: AC
  Administered 2018-04-19: 250 mL via INTRAVENOUS

## 2018-04-19 NOTE — Progress Notes (Signed)
[facility] Consultation Progress Note  Patient: Anthony [name] ZOX:[id number]   PCP: Rosita Fire, MD DOB: 09-Sep-1940   DOA: 04/12/2018   DOS: 04/19/2018   Date of Service: the patient was seen and examined on 04/19/2018 Primary service: Melida Quitter, MD   Brief hospital course: Pt. with PMH of CKD stage III, hydronephrosis status post suprapubic Foley catheter, COPD, dementia, who was admitted under ENT service for new diagnosis of lower lip squamous cell carcinoma status post total lower lip resection, bilateral neck dissection, and tracheostomy by ENT/plastic surgery, will patient recently diagnosed with a scalp malignancy, OG significant for squamous cell carcinoma, with some lymph node involvement, patient was on NG tube feed, unfortunately his NGT was dislodged overnight, surgery consulted regarding PEG tube, patient was noted to have NSVT, had low-grade temperature, had positive urine analysis, started on Bactrim, we were consulted for medical management.  Subjective: Sleepy and drowsy, unable to provide any history.  No acute events overnight.  Assessment and Plan: NSVT. -Patient had 12 beats of nonsustained V. tach earlier to 04/18/2018 -On telemetry no recurrence, asymptomatic, -Patient had recent 2D echo in 2019, with preserved EF and no regional wall motion abnormalities -Magnesium normal, potassium low normal, will replace. -Continue to monitor on telemetry. -Soft blood pressure holding the use of rate controlling medication.  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 -Management per primary team Tracheostomy was dislodged, notified primary team.  Currently replaced. To resume DVT prophylaxis once patient undergoes surgery  tomorrow.  Scalp Malignancy -With large growth in the scalp area, it is malignant as discussed with ENT, for surgery later this week.  Lung nodule 6 mm left lower lobe CT of the chest was performed, repeat CT scan of the chest recommended in 6 to 12 months interval.  Probably between July 2020 14 February 2019.  Low-grade fever -Temperature 99.8, will obtain CBC to check leukocyte count, he has positive urine analysis, but this would be expected with suprapubic Foley catheter, I have instructed staff to change his suprapubic Foley, will follow on urine cultures. -Patient with thick secretion from trach, will send tracheal aspirate for culture, as may be having tracheitis causing his fever. -Meanwhile continue with Bactrim(currently IV, as no source for oral intake)  Protein calorie malnutrition, severe -Continues to be on tube feed, unfortunately it was dislodged overnight, currently he is n.p.o., on IV fluids, general surgery was consulted for PEG tube placement.  CKD stage III -Function is actually better.  Will monitor.  Diet: npo DVT Prophylaxis: mechanical compression device  Family Communication: no family was present at bedside, at the time of interview.    Disposition: We will continue to follow the patient.   Recommendation on discharge: To be determined   Other Consultants: General surgery, plastic surgery  Procedures:  TOTAL LOWER LIP RESECTION (N/A) TRACHEOSTOMY (N/A) LOWER LIP RECONSTRUCTION WITH TISSUE REARRANGMENT OR REGIONAL FLAP (N/A) Bilateral Radical Neck Dissection (Bilateral)  Antibiotics: Anti-infectives (From admission, onward)   Start     Dose/Rate Route Frequency Ordered Stop   04/18/18 2200  sulfamethoxazole-trimethoprim (BACTRIM) 363.04 mg of trimethoprim in dextrose 5 % 500 mL IVPB     10 mg/kg/day of trimethoprim  72.6 kg 348.5 mL/hr over 90 Minutes Intravenous Every 12 hours 04/18/18 1711     04/17/18 2200  sulfamethoxazole-trimethoprim  (  BACTRIM,SEPTRA) 200-40 MG/5ML suspension 20 mL  Status:  Discontinued     20 mL Per Tube Every 12 hours 04/17/18 1844 04/18/18 1711   04/12/18 1000  ciprofloxacin (CIPRO) IVPB 400 mg     400 mg 200 mL/hr over 60 Minutes Intravenous On call to O.R. 04/12/18 0844 04/12/18 1039      Objective: Physical Exam: Vitals:   04/19/18 1135 04/19/18 1144 04/19/18 1527 04/19/18 1642  BP:  103/69 (!) 93/45 103/62  Pulse: 61 62 68 71  Resp: 19 12 (!) 22 18  Temp:  97.8 F (36.6 C)  (!) 97.5 F (36.4 C)  TempSrc:  Axillary  Axillary  SpO2: 100% 100% 96% 100%  Weight:      Height:        Intake/Output Summary (Last 24 hours) at 04/19/2018 1806 Last data filed at 04/19/2018 1200 Gross per 24 hour  Intake 1348.23 ml  Output 850 ml  Net 498.23 ml   Filed Weights   04/15/18 0413 04/17/18 0500 04/19/18 0337  Weight: 72.7 kg 72.6 kg 72.4 kg   General: Drowsy and lethargic, not responding to verbal stimuli. Eyes: PERRL, Conjunctiva normal ENT: Oral Mucosa clear moist. Neck: difficult to assess  JVD, no Abnormal Mass Or lumps Cardiovascular: S1 and S2 Present, aortic systolic  Murmur, Peripheral Pulses Present Respiratory: increased  respiratory effort, Bilateral Air entry equal and Decreased, no use of accessory muscle, bilateral  Crackles, no wheezes Abdomen: Bowel Sound present, Soft and no tenderness, no hernia Skin: no redness, no Rash, no induration Extremities: no Pedal edema, no calf tenderness Neurologic: Grossly no focal neuro deficit.  Data Reviewed: CBC: Recent Labs  Lab 04/18/18 1924  WBC 10.9*  HGB 9.8*  HCT 31.3*  MCV 89.7  PLT 166   Basic Metabolic Panel: Recent Labs  Lab 04/13/18 1856 04/14/18 0234 04/14/18 1657 04/18/18 1924 04/19/18 0229  NA  --   --   --   --  138  K  --   --   --   --  3.5  CL  --   --   --   --  106  CO2  --   --   --   --  22  GLUCOSE  --   --   --   --  119*  BUN  --   --   --   --  32*  CREATININE  --   --   --   --  1.35*  CALCIUM   --   --   --   --  8.6*  MG 1.8 1.8 1.9 2.2  --   PHOS 2.0* 2.3* 2.7 3.6  --    Liver Function Tests: No results for input(s): AST, ALT, ALKPHOS, BILITOT, PROT, ALBUMIN in the last 168 hours. No results for input(s): LIPASE, AMYLASE in the last 168 hours. No results for input(s): AMMONIA in the last 168 hours. Cardiac Enzymes: No results for input(s): CKTOTAL, CKMB, CKMBINDEX, TROPONINI in the last 168 hours. BNP (last 3 results) Recent Labs    10/10/17 1416  BNP 22.0   CBG: Recent Labs  Lab 04/18/18 2247 04/19/18 0313 04/19/18 0729 04/19/18 1142 04/19/18 1640  GLUCAP 129* 103* 119* 124* 84   Recent Results (from the past 240 hour(s))  MRSA PCR Screening     Status: Abnormal   Collection Time: 04/13/18  5:00 AM  Result Value Ref Range Status   MRSA by PCR POSITIVE (A) NEGATIVE Final  Comment:        The GeneXpert MRSA Assay (FDA approved for NASAL specimens only), is one component of a comprehensive MRSA colonization surveillance program. It is not intended to diagnose MRSA infection nor to guide or monitor treatment for MRSA infections. CRITICAL RESULT CALLED TO, READ BACK BY AND VERIFIED WITH: Rica Mote RN, AT [phone] 04/13/2018 BY Rush Landmark Performed at [facility], [place] [address], [place], [place] [address]     Studies: Ct Chest Wo Contrast  Addendum Date: 04/18/2018   ADDENDUM REPORT: 04/18/2018 21:48 ADDENDUM: Also noted is a proximal right humerus calcified enchondroma or bone infarct. Electronically Signed   By: Claudie Revering M.D.   On: 04/18/2018 21:48   Result Date: 04/18/2018 CLINICAL DATA:  11 mm peripheral right mid lung nodule on chest radiographs obtained yesterday. Further evaluation with CT was recommended. Admitted with fever. EXAM: CT CHEST WITHOUT CONTRAST TECHNIQUE: Multidetector CT imaging of the chest was performed following the standard protocol without IV contrast. COMPARISON:  Chest radiographs obtained yesterday. FINDINGS:  Cardiovascular: Atheromatous calcifications, including the coronary arteries and aorta. Borderline enlarged ascending thoracic aorta and proximal arch with a maximum diameter of 4.0 cm. Normal sized heart. Small pericardial effusion with a maximum thickness of 1.3 cm. Mediastinum/Nodes: Tracheostomy tube in satisfactory position. Small thyroid gland. No enlarged lymph nodes. No visible esophageal abnormality. Lungs/Pleura: Mild patchy and ground-glass interstitial opacity in both lower lung zones, most pronounced in the lower lobes. The recently suspected nodule in the lateral right mid lung zone is actually a portion of a confluent, linear area of atelectasis or scarring. There is a 6 mm subpleural nodule in the left lower lobe on image number 119 series 5. Mild biapical pleural and parenchymal scarring. No pleural fluid. Mild bilateral bullous changes with centrilobular and paraseptal distribution. Upper Abdomen: Atheromatous arterial calcifications. Otherwise, unremarkable upper abdomen. Musculoskeletal: Thoracic and lower cervical spine degenerative changes. Mild levoconvex thoracic scoliosis. IMPRESSION: 1. Mild patchy and ground-glass interstitial opacity in both lower lung zones, most pronounced in the lower lobes. This could represent mild interstitial pulmonary edema, infection, inflammation or aspiration pneumonitis. 2. 6 mm subpleural nodule in the left lower lobe. Non-contrast chest CT at 6-12 months is recommended. If the nodule is stable at time of repeat CT, then future CT at 18-24 months (from today's scan) is considered optional for low-risk patients, but is recommended for high-risk patients. This recommendation follows the consensus statement: Guidelines for Management of Incidental Pulmonary Nodules Detected on CT Images: From the Fleischner Society 2017; Radiology 2017; 284:228-243. 3. Borderline aneurysmal dilatation of the ascending thoracic aorta and proximal arch, measuring 4.0 cm in maximum  diameter. Recommend annual imaging followup by CTA or MRA. This recommendation follows 2010 ACCF/AHA/AATS/ACR/ASA/SCA/SCAI/SIR/STS/SVM Guidelines for the Diagnosis and Management of Patients with Thoracic Aortic Disease. 2010; 121: E423-N361. 4. The recently suspected right mid lung nodule was an area of confluent linear atelectasis or scarring viewed on end. 5. Small pericardial effusion. 6. Mild changes of COPD with centrilobular and paraseptal emphysema. 7. Calcific coronary artery and aortic atherosclerosis. Aortic Atherosclerosis (ICD10-I70.0) and Emphysema (ICD10-J43.9). Electronically Signed: By: Claudie Revering M.D. On: 04/18/2018 21:35     Scheduled Meds: . bacitracin   Topical BID  . chlorhexidine  15 mL Mouth Rinse BID  . mouth rinse  15 mL Mouth Rinse q12n4p  . pantoprazole (PROTONIX) IV  40 mg Intravenous Q12H  . white petrolatum   Topical BID   Continuous Infusions: . dextrose 5 %  and 0.45 % NaCl with KCl 20 mEq/L 125 mL/hr at 04/19/18 0000  . sulfamethoxazole-trimethoprim 363.04 mg of trimethoprim (04/19/18 1109)   PRN Meds: morphine injection, ondansetron (ZOFRAN) IV  Time spent: 35 minutes  Author: Berle Mull, MD [facility] 04/19/2018 6:06 PM  If 7PM-7AM, please contact night-coverage at [email or web address]

## 2018-04-19 NOTE — Progress Notes (Signed)
Spoke with RN Trish and requested that once the patient was back in the bed, could she re put in the consult for an IV. She said yes.

## 2018-04-19 NOTE — Progress Notes (Signed)
Occupational Therapy Treatment Patient Details Name: Anthony [name] MRN: [id number] DOB: 18-Apr-1940 Today's Date: 04/19/2018    History of present illness 78 yo male admitted for management of squamous cell carcinoma of lip; now s/p total lower lip resection, tracheostomy, Reconstruction of lower lip defect, bilateral radical neck dissection due to enlarged nodes in both superior necks;  has a pertinent past medical history of Acute pyelonephritis (12/23/2014), Arthritis, Asthma, BPH (benign prostatic hyperplasia), Cancer ([place]), Chronic back pain, Chronic hip pain, CKD (chronic kidney disease), COPD (chronic obstructive pulmonary disease) ([place]), Dementia ([place]), Depression, Dysphagia, GERD (gastroesophageal reflux disease), Headache, History of kidney stones, Hypertension, Kidney stone, Neuropathy, Pneumonia, PTSD (post-traumatic stress disorder), Pulmonary nodules (12/25/2014), and Restless leg syndrome.   OT comments  Pt progressing towards established OT goals. Pt performing bed mobility with Min A and noting bowel incontience. Pt requiring Max A +2 for toilet hygiene. Pt requiring Min A +2 for sit<>stand with RW, but then Max A +2 for safe pivot due to pt attempting to sit prematurely despite verbal cues. Continue to recommend dc to SNF and will continue follow acutely as admitted.    Follow Up Recommendations  SNF    Equipment Recommendations  Other (comment)(defer to next venue)    Recommendations for Other Services      Precautions / Restrictions Precautions Precautions: Fall;Other (comment)(trach) Precaution Comments: Trach, NG tube Restrictions Weight Bearing Restrictions: No       Mobility Bed Mobility Overal bed mobility: Needs Assistance Bed Mobility: Sidelying to Sit   Sidelying to sit: HOB elevated;Min assist       General bed mobility comments: Min A to elevate trunk  Transfers Overall transfer level: Needs assistance Equipment used: Rolling walker (2 wheeled);2  person hand held assist Transfers: Sit to/from [facility] Sit to Stand: +2 physical assistance;+2 safety/equipment;Min assist Stand pivot transfers: Mod assist;+2 physical assistance;+2 safety/equipment;Max assist       General transfer comment: Min A +2 for power up into standing. Pt requiring Mod A for pivot and then Max for safe descent with pt attempt to sit prematurely    Balance Overall balance assessment: Needs assistance Sitting-balance support: Feet supported Sitting balance-Leahy Scale: Fair Sitting balance - Comments: adjusted socks while sitting EOB with minguardA for stability   Standing balance support: Bilateral upper extremity supported;During functional activity Standing balance-Leahy Scale: Poor Standing balance comment: Reliant on UE support                           ADL either performed or assessed with clinical judgement   ADL Overall ADL's : Needs assistance/impaired                 Upper Body Dressing : Minimal assistance;Sitting Upper Body Dressing Details (indicate cue type and reason): Changed into new gown with Min A Lower Body Dressing: Minimal assistance;Sit to/from stand Lower Body Dressing Details (indicate cue type and reason): Able to don socks by bringing ankles to knees. Min A for sit<>Stand Toilet Transfer: +2 for physical assistance;Stand-pivot;Cueing for safety;RW;Minimal assistance;Maximal assistance;+2 for safety/equipment Toilet Transfer Details (indicate cue type and reason): Pt requiring Min A +2 for sit<>stand and powering up into standing. Max A for safe descent with pt sitting prematurely Toileting- Clothing Manipulation and Hygiene: Maximal assistance;+2 for safety/equipment;Sit to/from stand Toileting - Clothing Manipulation Details (indicate cue type and reason): Max A +2 for safety in standing and then for peri care after bowel incontience in bed  Functional mobility during ADLs: +2 for physical  assistance;+2 for safety/equipment;Rolling walker;Minimal assistance;Maximal assistance(stand pivot) General ADL Comments: pt quickly fatigues with transferring from EOB to recliner     Vision       Perception     Praxis      Cognition Arousal/Alertness: Awake/alert Behavior During Therapy: WFL for tasks assessed/performed Overall Cognitive Status: No family/caregiver present to determine baseline cognitive functioning                                 General Comments: pt engaged conversationally, with comedic input;pt appeared motivated to participate during session         Exercises     Shoulder Instructions       General Comments VSS throughout    Pertinent Vitals/ Pain       Pain Assessment: Faces Faces Pain Scale: Hurts even more Pain Location: back Pain Descriptors / Indicators: Grimacing Pain Intervention(s): Monitored during session;Limited activity within patient's tolerance;Repositioned  Home Living                                          Prior Functioning/Environment              Frequency  Min 2X/week        Progress Toward Goals  OT Goals(current goals can now be found in the care plan section)  Progress towards OT goals: Progressing toward goals  Acute Rehab OT Goals Patient Stated Goal: to eat a cheeseburger OT Goal Formulation: With patient Time For Goal Achievement: 04/30/18 Potential to Achieve Goals: Good ADL Goals Pt Will Perform Upper Body Bathing: with min assist;sitting Pt Will Perform Lower Body Bathing: with mod assist;sit to/from stand Pt Will Perform Upper Body Dressing: with min assist;sitting Pt Will Perform Lower Body Dressing: with mod assist;sit to/from stand Pt Will Transfer to Toilet: ambulating;with min assist Pt Will Perform Toileting - Clothing Manipulation and hygiene: with min assist;sit to/from stand  Plan Discharge plan remains appropriate    Co-evaluation                  AM-PAC OT "6 Clicks" Daily Activity     Outcome Measure   Help from another person eating meals?: Total Help from another person taking care of personal grooming?: A Lot Help from another person toileting, which includes using toliet, bedpan, or urinal?: A Lot Help from another person bathing (including washing, rinsing, drying)?: A Lot Help from another person to put on and taking off regular upper body clothing?: A Little Help from another person to put on and taking off regular lower body clothing?: A Little 6 Click Score: 13    End of Session Equipment Utilized During Treatment: Rolling walker;Oxygen  OT Visit Diagnosis: Unsteadiness on feet (R26.81);Muscle weakness (generalized) (M62.81);Pain Pain - part of body: (back)   Activity Tolerance Patient tolerated treatment well;Patient limited by fatigue   Patient Left in chair;with call bell/phone within reach;with chair alarm set   Nurse Communication Mobility status        Time: 1405-1430 OT Time Calculation (min): 25 min  Charges: OT General Charges $OT Visit: 1 Visit OT Treatments $Self Care/Home Management : 23-37 mins  [place], OTR/L Acute Rehab Pager: [phone] Office: [place] 04/19/2018, 5:24 PM

## 2018-04-19 NOTE — Progress Notes (Signed)
Patient pulled out trach.  #6 shiley cuffless trach replaced on first attempt.  Trach passed easily, no bleeding.  Placement confirmed by BBS, catheter passes easily and pt has tan thick secretions.  Sats 100% throughout.  RNs at bedside.

## 2018-04-19 NOTE — Progress Notes (Signed)
   Subjective:    Patient ID: Anthony [name], male    DOB: [birth year], 78 y.o.   MRN: [id number]  HPI Trach tube came out earlier today.  A #4 cuffless tube was able to be replaced by respiratory therapy.  He is dong well aside from facial pain.  He is hungry.  Review of Systems     Objective:   Physical Exam AF VSS Alert, NAD Scalp tumor stable Lower lip reconstruction with breakdown centrally Neck incision clean and intact, no fluid collection Trach tube easily changed to #6 cuffless Shiley, site stable     Assessment & Plan:  Lip cancer s/p resection, reconstruction, tracheostomy, and neck dissections Malnutrition Scalp cancer  His trach tube was replaced.  He is doing fairly well overall.  He is scheduled tomorrow for G-tube placement, scalp resection with reconstruction, and lower lip reconstruction revision.  This was discussed with the patient and Anthony [name], his case worker at his facility.  She will call back with consent.  Appreciate assistance from Hospitalist service.  Will start chemical DVT prophylaxis after surgery.

## 2018-04-19 NOTE — Trach Care Team (Signed)
Dr. Redmond Baseman replaced #4 Shiley cuffless trach with a #6 cuffless trach. Patient coughing up thick blood-tinged sputum. Suctioned X 2 passes as patient was not able to cough secretions out of tracheostomy tube cannula. Tolerated suctioning well. Strong cough. Subsequent secretions clear and stringy. Foul smell from mouth and tracheostomy.

## 2018-04-19 NOTE — Progress Notes (Signed)
Anthony [name], [employer] and gave consent for the three procedures that Dr. Redmond Baseman discussed with her. See Dr. Redmond Baseman note

## 2018-04-19 NOTE — Progress Notes (Signed)
RT called to pt's room stat as pt had pulled his trach out. This RT unable to place new #6 CFS trach as resistance was met. A second RT was able to place a smaller trach #4 CFS with pressure. Small amount of blood was coughed up. Positive color change noted on etco2. Bilateral Rhonchi BS throughout. Pt suctioned through trach with 75F catheter and small amount of tan/blood tinged sputum suctioned out. VS within normal limits. No distress noted. ENT MD made aware he will come in to place #6 back in. RT will continue to monitor

## 2018-04-19 NOTE — Progress Notes (Signed)
Dr. Posey Pronto informed RN of pt trach sitting on side of bed. Pt lying in bed no acute distress noted. RR 16 sating 97%. RT called and at bedside. Dr. Redmond Baseman notified and stated will be by to assess. Will continue to monitor

## 2018-04-20 ENCOUNTER — Encounter (HOSPITAL_COMMUNITY)
Admission: RE | Disposition: A | Discharge status: Skilled Nursing Facility | Payer: Self-pay | Source: Skilled Nursing Facility | Attending: Otolaryngology

## 2018-04-20 ENCOUNTER — Inpatient Hospital Stay (HOSPITAL_COMMUNITY): Payer: Medicare Other | Admitting: Certified Registered Nurse Anesthetist

## 2018-04-20 ENCOUNTER — Encounter (HOSPITAL_COMMUNITY): Payer: Self-pay | Admitting: Certified Registered Nurse Anesthetist

## 2018-04-20 DIAGNOSIS — T8131XA Disruption of external operation (surgical) wound, not elsewhere classified, initial encounter: Secondary | ICD-10-CM

## 2018-04-20 DIAGNOSIS — C4440 Unspecified malignant neoplasm of skin of scalp and neck: Secondary | ICD-10-CM

## 2018-04-20 HISTORY — PX: SCALP LACERATION REPAIR: SHX6089

## 2018-04-20 HISTORY — PX: LAPAROSCOPIC GASTROSTOMY: SHX5896

## 2018-04-20 HISTORY — PX: NASAL FLAP ROTATION: SHX5366

## 2018-04-20 LAB — COMPREHENSIVE METABOLIC PANEL
ALBUMIN: 2.3 g/dL — AB (ref 3.5–5.0)
ALT: 16 U/L (ref 0–44)
AST: 18 U/L (ref 15–41)
Alkaline Phosphatase: 57 U/L (ref 38–126)
Anion gap: 8 (ref 5–15)
BUN: 23 mg/dL (ref 8–23)
CO2: 20 mmol/L — ABNORMAL LOW (ref 22–32)
Calcium: 8.3 mg/dL — ABNORMAL LOW (ref 8.9–10.3)
Chloride: 107 mmol/L (ref 98–111)
Creatinine, Ser: 1.42 mg/dL — ABNORMAL HIGH (ref 0.61–1.24)
GFR calc Af Amer: 54 mL/min — ABNORMAL LOW (ref >60)
GFR calc non Af Amer: 47 mL/min — ABNORMAL LOW (ref >60)
Glucose, Bld: 118 mg/dL — ABNORMAL HIGH (ref 70–99)
Potassium: 4.6 mmol/L (ref 3.5–5.1)
Sodium: 135 mmol/L (ref 135–145)
Total Bilirubin: 0.4 mg/dL (ref 0.3–1.2)
Total Protein: 6.6 g/dL (ref 6.5–8.1)

## 2018-04-20 LAB — CBC WITH DIFFERENTIAL/PLATELET
ABS IMMATURE GRANULOCYTES: 0.12 10*3/uL — AB (ref 0.00–0.07)
Basophils Absolute: 0 10*3/uL (ref 0.0–0.1)
Basophils Relative: 1 %
Eosinophils Absolute: 0.2 10*3/uL (ref 0.0–0.5)
Eosinophils Relative: 3 %
HCT: 31.2 % — ABNORMAL LOW (ref 39.0–52.0)
Hemoglobin: 9.7 g/dL — ABNORMAL LOW (ref 13.0–17.0)
IMMATURE GRANULOCYTES: 2 %
Lymphocytes Relative: 7 %
Lymphs Abs: 0.5 10*3/uL — ABNORMAL LOW (ref 0.7–4.0)
MCH: 27.2 pg (ref 26.0–34.0)
MCHC: 31.1 g/dL (ref 30.0–36.0)
MCV: 87.6 fL (ref 80.0–100.0)
Monocytes Absolute: 1 10*3/uL (ref 0.1–1.0)
Monocytes Relative: 14 %
NEUTROS PCT: 73 %
Neutro Abs: 5.1 10*3/uL (ref 1.7–7.7)
Platelets: 287 10*3/uL (ref 150–400)
RBC: 3.56 MIL/uL — ABNORMAL LOW (ref 4.22–5.81)
RDW: 15.5 % (ref 11.5–15.5)
WBC: 6.9 10*3/uL (ref 4.0–10.5)
nRBC: 0 % (ref 0.0–0.2)

## 2018-04-20 LAB — GLUCOSE, CAPILLARY
Glucose-Capillary: 118 mg/dL — ABNORMAL HIGH (ref 70–99)
Glucose-Capillary: 130 mg/dL — ABNORMAL HIGH (ref 70–99)
Glucose-Capillary: 113 mg/dL — ABNORMAL HIGH (ref 70–99)
Glucose-Capillary: 138 mg/dL — ABNORMAL HIGH (ref 70–99)

## 2018-04-20 LAB — MAGNESIUM: Magnesium: 2.1 mg/dL (ref 1.7–2.4)

## 2018-04-20 SURGERY — REPAIR, LACERATION, SCALP
Anesthesia: General | Site: Scalp

## 2018-04-20 MED ORDER — SODIUM CHLORIDE 0.9 % IV SOLN
INTRAVENOUS | Status: DC | PRN
  Administered 2018-04-20: 20 ug/min via INTRAVENOUS

## 2018-04-20 MED ORDER — 0.9 % SODIUM CHLORIDE (POUR BTL) OPTIME
TOPICAL | Status: DC | PRN
  Administered 2018-04-20 (×2): 1000 mL

## 2018-04-20 MED ORDER — OXYCODONE HCL 5 MG/5ML PO SOLN: 5.0000 mg | Freq: Once | ORAL | Status: DC | PRN

## 2018-04-20 MED ORDER — FENTANYL CITRATE (PF) 250 MCG/5ML IJ SOLN
INTRAMUSCULAR | Status: AC
  Filled 2018-04-20: qty 5

## 2018-04-20 MED ORDER — OXYCODONE HCL 5 MG PO TABS: 5.0000 mg | ORAL_TABLET | Freq: Once | ORAL | Status: DC | PRN

## 2018-04-20 MED ORDER — BUPIVACAINE-EPINEPHRINE 0.5% -1:200000 IJ SOLN
INTRAMUSCULAR | Status: AC
  Filled 2018-04-20: qty 1

## 2018-04-20 MED ORDER — ROCURONIUM BROMIDE 50 MG/5ML IV SOSY
PREFILLED_SYRINGE | INTRAVENOUS | Status: AC
  Filled 2018-04-20: qty 5

## 2018-04-20 MED ORDER — PROPOFOL 10 MG/ML IV BOLUS
INTRAVENOUS | Status: DC | PRN
  Administered 2018-04-20: 30 mg via INTRAVENOUS
  Administered 2018-04-20: 10 mg via INTRAVENOUS
  Administered 2018-04-20: 20 mg via INTRAVENOUS

## 2018-04-20 MED ORDER — ONDANSETRON HCL 4 MG/2ML IJ SOLN: 4.0000 mg | Freq: Once | INTRAMUSCULAR | Status: DC | PRN

## 2018-04-20 MED ORDER — BACITRACIN ZINC 500 UNIT/GM EX OINT
TOPICAL_OINTMENT | CUTANEOUS | Status: AC
  Filled 2018-04-20: qty 28.35

## 2018-04-20 MED ORDER — BUPIVACAINE-EPINEPHRINE (PF) 0.5% -1:200000 IJ SOLN
INTRAMUSCULAR | Status: DC | PRN
  Administered 2018-04-20: 14 mL

## 2018-04-20 MED ORDER — FENTANYL CITRATE (PF) 100 MCG/2ML IJ SOLN
INTRAMUSCULAR | Status: AC
  Filled 2018-04-20: qty 2

## 2018-04-20 MED ORDER — BUPIVACAINE HCL (PF) 0.25 % IJ SOLN
INTRAMUSCULAR | Status: AC
  Filled 2018-04-20: qty 30

## 2018-04-20 MED ORDER — BUPIVACAINE HCL 0.25 % IJ SOLN
INTRAMUSCULAR | Status: DC | PRN
  Administered 2018-04-20: 30 mL

## 2018-04-20 MED ORDER — PROPOFOL 10 MG/ML IV BOLUS
INTRAVENOUS | Status: AC
  Filled 2018-04-20: qty 20

## 2018-04-20 MED ORDER — DEXAMETHASONE SODIUM PHOSPHATE 10 MG/ML IJ SOLN
INTRAMUSCULAR | Status: AC
  Filled 2018-04-20: qty 1

## 2018-04-20 MED ORDER — DEXAMETHASONE SODIUM PHOSPHATE 10 MG/ML IJ SOLN
INTRAMUSCULAR | Status: DC | PRN
  Administered 2018-04-20: 10 mg via INTRAVENOUS

## 2018-04-20 MED ORDER — LACTATED RINGERS IV SOLN
INTRAVENOUS | Status: DC
  Administered 2018-04-20 (×2): via INTRAVENOUS

## 2018-04-20 MED ORDER — LIDOCAINE-EPINEPHRINE 1 %-1:100000 IJ SOLN
INTRAMUSCULAR | Status: AC
  Filled 2018-04-20: qty 1

## 2018-04-20 MED ORDER — FENTANYL CITRATE (PF) 100 MCG/2ML IJ SOLN
25.0000 ug | INTRAMUSCULAR | Status: DC | PRN
  Administered 2018-04-20: 25 ug via INTRAVENOUS

## 2018-04-20 MED ORDER — FENTANYL CITRATE (PF) 100 MCG/2ML IJ SOLN
INTRAMUSCULAR | Status: DC | PRN
  Administered 2018-04-20 (×7): 25 ug via INTRAVENOUS

## 2018-04-20 MED ORDER — LIDOCAINE 2% (20 MG/ML) 5 ML SYRINGE
INTRAMUSCULAR | Status: AC
  Filled 2018-04-20: qty 5

## 2018-04-20 MED ORDER — ONDANSETRON HCL 4 MG/2ML IJ SOLN
INTRAMUSCULAR | Status: AC
  Filled 2018-04-20: qty 2

## 2018-04-20 MED ORDER — BUPIVACAINE HCL (PF) 0.5 % IJ SOLN
INTRAMUSCULAR | Status: AC
  Filled 2018-04-20: qty 30

## 2018-04-20 MED ORDER — SULFAMETHOXAZOLE-TRIMETHOPRIM 400-80 MG/5ML IV SOLN
160.0000 mg | Freq: Two times a day (BID) | INTRAVENOUS | Status: DC
  Administered 2018-04-20 – 2018-04-25 (×10): 160 mg via INTRAVENOUS
  Filled 2018-04-20 (×12): qty 10

## 2018-04-20 MED ORDER — SODIUM CHLORIDE 0.9 % IV BOLUS
500.0000 mL | Freq: Once | INTRAVENOUS | Status: AC
  Administered 2018-04-20: 500 mL via INTRAVENOUS

## 2018-04-20 MED ORDER — LIDOCAINE-EPINEPHRINE 0.5 %-1:200000 IJ SOLN
INTRAMUSCULAR | Status: DC | PRN
  Administered 2018-04-20: 5 mL

## 2018-04-20 SURGICAL SUPPLY — 75 items
APPLICATOR COTTON TIP 6IN STRL (MISCELLANEOUS) ×6 IMPLANT
BAG URINE DRAINAGE (UROLOGICAL SUPPLIES) IMPLANT
BLADE 10 SAFETY STRL DISP (BLADE) ×3 IMPLANT
BLADE CLIPPER SURG (BLADE) IMPLANT
BLADE SURG 15 STRL LF DISP TIS (BLADE) ×3 IMPLANT
BLADE SURG 15 STRL SS (BLADE) ×4
CANISTER SUCT 3000ML PPV (MISCELLANEOUS) IMPLANT
CANISTER SUCTION 1200CC (MISCELLANEOUS) IMPLANT
CHLORAPREP W/TINT 26ML (MISCELLANEOUS) ×5 IMPLANT
COTTONBALL LRG STERILE PKG (GAUZE/BANDAGES/DRESSINGS) ×3 IMPLANT
COVER SURGICAL LIGHT HANDLE (MISCELLANEOUS) ×5 IMPLANT
COVER WAND RF STERILE (DRAPES) ×6 IMPLANT
DECANTER SPIKE VIAL GLASS SM (MISCELLANEOUS) ×10 IMPLANT
DERMABOND ADVANCED (GAUZE/BANDAGES/DRESSINGS)
DERMABOND ADVANCED .7 DNX12 (GAUZE/BANDAGES/DRESSINGS) ×3 IMPLANT
DRESSING HYDROCOLLOID 4X4 XTH (GAUZE/BANDAGES/DRESSINGS) ×2 IMPLANT
DRSG CUTIMED SORBACT 7X9 (GAUZE/BANDAGES/DRESSINGS) ×2 IMPLANT
ELECT NDL TIP 2.8 STRL (NEEDLE) ×3 IMPLANT
ELECT NEEDLE TIP 2.8 STRL (NEEDLE) ×5 IMPLANT
ELECT REM PT RETURN 9FT ADLT (ELECTROSURGICAL) ×5
ELECTRODE REM PT RTRN 9FT ADLT (ELECTROSURGICAL) ×6 IMPLANT
GAUZE 4X4 16PLY RFD (DISPOSABLE) IMPLANT
GAUZE XEROFORM 5X9 LF (GAUZE/BANDAGES/DRESSINGS) ×3 IMPLANT
GLOVE BIO SURGEON STRL SZ 6.5 (GLOVE) ×4 IMPLANT
GLOVE BIO SURGEONS STRL SZ 6.5 (GLOVE) ×1
GLOVE BIOGEL PI IND STRL 7.0 (GLOVE) ×3 IMPLANT
GLOVE BIOGEL PI INDICATOR 7.0 (GLOVE) ×2
GLOVE SURG SS PI 7.0 STRL IVOR (GLOVE) ×5 IMPLANT
GOWN STRL REUS W/ TWL LRG LVL3 (GOWN DISPOSABLE) ×15 IMPLANT
GOWN STRL REUS W/TWL LRG LVL3 (GOWN DISPOSABLE) ×14
KIT BASIN OR (CUSTOM PROCEDURE TRAY) ×10 IMPLANT
KIT TURNOVER KIT B (KITS) ×5 IMPLANT
MATRIX WOUND 3-LAYER 7X10 (Tissue) ×1 IMPLANT
MICROMATRIX 500MG (Tissue) ×15 IMPLANT
NDL HYPO 25GX1X1/2 BEV (NEEDLE) ×3 IMPLANT
NDL PRECISIONGLIDE 27X1.5 (NEEDLE) ×3 IMPLANT
NEEDLE 22X1 1/2 (OR ONLY) (NEEDLE) ×3 IMPLANT
NEEDLE HYPO 25GX1X1/2 BEV (NEEDLE) ×10 IMPLANT
NEEDLE PRECISIONGLIDE 27X1.5 (NEEDLE) IMPLANT
NS IRRIG 1000ML POUR BTL (IV SOLUTION) ×10 IMPLANT
PACK EENT II TURBAN DRAPE (CUSTOM PROCEDURE TRAY) ×5 IMPLANT
PAD ARMBOARD 7.5X6 YLW CONV (MISCELLANEOUS) ×10 IMPLANT
PAD EYE OVAL STERILE LF (GAUZE/BANDAGES/DRESSINGS) IMPLANT
PENCIL BUTTON HOLSTER BLD 10FT (ELECTRODE) ×5 IMPLANT
PLUG CATH AND CAP STER (CATHETERS) IMPLANT
SCISSORS LAP 5X35 DISP (ENDOMECHANICALS) IMPLANT
SET IRRIG TUBING LAPAROSCOPIC (IRRIGATION / IRRIGATOR) ×2 IMPLANT
SET TUBE SMOKE EVAC HIGH FLOW (TUBING) ×5 IMPLANT
SLEEVE ENDOPATH XCEL 5M (ENDOMECHANICALS) ×9 IMPLANT
SOLUTION PARTIC MCRMTRX 500MG (Tissue) IMPLANT
SUCTION FRAZIER HANDLE 10FR (MISCELLANEOUS)
SUCTION FRAZIER TIP 10 FR DISP (SUCTIONS) ×2 IMPLANT
SUCTION TUBE FRAZIER 10FR DISP (MISCELLANEOUS) IMPLANT
SUT ETHILON 2 0 FS 18 (SUTURE) IMPLANT
SUT ETHILON 4 0 PS 2 18 (SUTURE) IMPLANT
SUT ETHILON 5 0 P 3 18 (SUTURE)
SUT ETHILON 5 0 PS 2 18 (SUTURE) IMPLANT
SUT MNCRL AB 4-0 PS2 18 (SUTURE) ×7 IMPLANT
SUT MON AB 5-0 P3 18 (SUTURE) ×4 IMPLANT
SUT NYLON ETHILON 5-0 P-3 1X18 (SUTURE) IMPLANT
SUT VIC AB 4-0 P-3 18X BRD (SUTURE) IMPLANT
SUT VIC AB 4-0 P3 18 (SUTURE) ×4
SUT VIC AB 4-0 PS2 18 (SUTURE) ×8 IMPLANT
SUT VIC AB 5-0 PS2 18 (SUTURE) ×6 IMPLANT
SYR BULB 3OZ (MISCELLANEOUS) ×5 IMPLANT
SYR CONTROL 10ML LL (SYRINGE) ×5 IMPLANT
TOWEL OR 17X24 6PK STRL BLUE (TOWEL DISPOSABLE) ×15 IMPLANT
TOWEL OR 17X26 10 PK STRL BLUE (TOWEL DISPOSABLE) ×5 IMPLANT
TRAY LAPAROSCOPIC MC (CUSTOM PROCEDURE TRAY) ×5 IMPLANT
TROCAR XCEL BLUNT TIP 100MML (ENDOMECHANICALS) IMPLANT
TROCAR XCEL NON-BLD 5MMX100MML (ENDOMECHANICALS) ×5 IMPLANT
TUBE CONNECTING 20'X1/4 (TUBING)
TUBE CONNECTING 20X1/4 (TUBING) IMPLANT
WATER STERILE IRR 1000ML POUR (IV SOLUTION) ×5 IMPLANT
WOUND MATRIX 3-LAYER 7X10 (Tissue) ×1 IMPLANT

## 2018-04-20 NOTE — Progress Notes (Signed)
PT refused suctioning

## 2018-04-20 NOTE — Op Note (Signed)
DATE OF OPERATION: 04/20/2018  LOCATION: [facility]  PREOPERATIVE DIAGNOSIS: lower lip incision dehisense, scalp skin cancer   POSTOPERATIVE DIAGNOSIS: Same  PROCEDURE:  1. Re-advancment of lower lip flap 9 cm total 2. Preparation / Repair of scalp defect 6 x 9 cm with placement of Acell (7 x 10 sheet and 1.5 gm powder).  SURGEON: Khoury Siemon Sanger Madalene Mickler, DO  ASSISTANT: Carmen Mayo, PA  EBL: 10 cc  CONDITION: Stable  COMPLICATIONS: None  INDICATION: The patient, Anthony [name], is a 78 y.o. male born on Dec 20, 1940, is here for treatment of his lower lip defect from excision of skin cancer and repair of scalp wound after excision of skin cancer.   PROCEDURE DETAILS:  The patient was seen prior to surgery and marked.  The IV antibiotics were given. The patient was taken to the operating room and given a general anesthetic. A standard time out was performed and all information was confirmed by those in the room. SCDs were placed.   His scalp was prepped and draped.    Scalp: Dr. Redmond Baseman performed the resection of the scalp skin cancer.  The defect was 6 x 9 cm.  There was some periosteum and some bone exposed.  It was clean and no sign of infection.  All of the acell powder and the 6 x 9 cm sheet of acell was applied to the defect and secured with the 5-0 Vicryl.  The sorbact was applied and secured with the Vicryl.  KY gel and gauze was applied.    Lip:  Once General surgery was completed with the attempt of the peg tube the patient was rendered to the plastic surgery team.  Local with epinepherine was placed for intraoperative hemostasis and postoperative pain control.  There was separation of the flaps on each side where they met in the midline.  There was some fibrous tissue as well.  The #15 blade was used to excise the edge of all margins of the flaps (9 cm total).  The muscle on the left was freed and advanced to the midline.  The right side of the fugimori flap was  freed and re-advanced to meet the left muscle. The muscle was secured with the 4-0 Vicryl.  The buccal mucosa was secured to the gingiva and flap mucosa on each side. This was done with a combination of the 5 and 4-0 Vicryl.  The skin was then brought together and secured with the 4-0 and 5-0 Monocryl.  Vertical mattress and horizontal mattress sutures were used.  Bactroban was applied.   The patient was allowed to wake up and taken to recovery room in stable condition at the end of the case. The advanced practice practitioner (APP) assisted throughout the case.  The APP was essential in retraction and counter traction when needed to make the case progress smoothly.  This retraction and assistance made it possible to see the tissue plans for the procedure.  The assistance was needed for blood control, tissue re-approximation and assisted with closure of the incision site.

## 2018-04-20 NOTE — Op Note (Signed)
NAME: ASBURY, HAIR MEDICAL RECORD VQ:[id number] ACCOUNT [id number] DATE OF BIRTH:04/12/1940 FACILITY: [facility] LOCATION: [facility] PHYSICIAN:Aerie Donica Guido Sander, MD  OPERATIVE REPORT  DATE OF PROCEDURE:  04/20/2018  PREOPERATIVE DIAGNOSIS:  Scalp cancer.  POSTOPERATIVE DIAGNOSIS:  Scalp cancer.  PROCEDURE:  Excision of right vertex scalp, 7 cm.  SURGEON:  Melida Quitter, MD  ANESTHESIA:  General endotracheal anesthesia.  COMPLICATIONS:  None.  INDICATIONS:  The patient is a 78 year old male with a large skin cancer on his right vertex scalp.  Last week, he underwent resection of a lower lip skin cancer that was quite large and required reconstruction.  He presents back to the operating room  today for some revision of the lower lip by Dr. Marla Roe, as well as a gastrostomy tube placement by Dr. Kieth Brightly.  We agreed to proceed with removal of the scalp lesion as well.  FINDINGS:  The right vertex scalp had a sizable skin cancer with a 7 cm excision required.  Margins were sent from the periphery of the remaining skin and from the deep periosteum under the tumor.  DESCRIPTION OF PROCEDURE:  The patient was identified in the holding room, informed consent having been obtained including discussion of risks, benefits and alternatives.  The patient was brought to the operative suite and placed on the operative table  in supine position.  Anesthesia was induced, and the patient was maintained via endotracheal anesthesia via his tracheostomy tube.  The head was positioned, turned a little bit to the left.  The incision around the scalp mass was marked with a marking  pen and injected with 1% lidocaine with 1:100,000 epinephrine.  The scalp was prepped and draped in sterile fashion.  An incision was made around the mass using a 15-blade scalpel and extended through subcutaneous tissue, through the galea using Bovie  electrocautery.  The lesion was then dissected from the deep periosteum, and a  plane was able to be maintained under the mass.  It was fully excised and marked with orienting suture.  Bleeding was controlled with the periphery using Bovie electrocautery.   The margins were then collected in a clock orientation using a 15-blade scalpel and sent separately for frozen section.  Soft tissue in the center of the resection at the periosteum was then removed using a 15-blade scalpel and a Soil scientist.  These  were all sent for frozen section.  After bleeding was controlled, the case was then taken over by Dr. Marla Roe to begin reconstruction.  Dr. Kieth Brightly also was doing his part of the surgery at this point as well.  For further details of their surgery,  please see their operative notes.  LN/NUANCE  D:04/20/2018 T:04/20/2018 JOB:[id number]

## 2018-04-20 NOTE — Anesthesia Preprocedure Evaluation (Signed)
Anesthesia Evaluation  Patient identified by MRN, date of birth, ID band Patient awake    Reviewed: Allergy & Precautions, NPO status , Patient's Chart, lab work & pertinent test results  History of Anesthesia Complications Negative for: history of anesthetic complications  Airway Mallampati: Trach  TM Distance: >3 FB Neck ROM: Full   Comment: Large lip cancer but does not obstruct airway Dental  (+) Edentulous Upper, Edentulous Lower   Pulmonary asthma , COPD, former smoker,    Pulmonary exam normal        Cardiovascular hypertension, Normal cardiovascular exam     Neuro/Psych PSYCHIATRIC DISORDERS Anxiety Depression Dementia negative neurological ROS     GI/Hepatic Neg liver ROS, GERD  ,  Endo/Other  negative endocrine ROS  Renal/GU Renal InsufficiencyRenal disease Bladder dysfunction (BOO with suprapubic catheter)      Musculoskeletal negative musculoskeletal ROS (+)   Abdominal   Peds  Hematology  (+) anemia ,   Anesthesia Other Findings Patient had large lip cancer s/p resection with neck dissections and tracheostomy. He now returns for resection of a scalp lesion as well as PEG placement and revision of the lip lesion. He is breathing spontaneously on trach collar.   Reproductive/Obstetrics                             Anesthesia Physical  Anesthesia Plan  ASA: III  Anesthesia Plan: General   Post-op Pain Management:    Induction: Intravenous and Inhalational  PONV Risk Score and Plan: 2 and Ondansetron, Dexamethasone and Treatment may vary due to age or medical condition  Airway Management Planned: Tracheostomy  Additional Equipment: None  Intra-op Plan:   Post-operative Plan: Post-operative intubation/ventilation  Informed Consent: I have reviewed the patients History and Physical, chart, labs and discussed the procedure including the risks, benefits and alternatives  for the proposed anesthesia with the patient or authorized representative who has indicated his/her understanding and acceptance.       Plan Discussed with:   Anesthesia Plan Comments:         Anesthesia Quick Evaluation

## 2018-04-20 NOTE — Progress Notes (Signed)
[facility] Consultation Progress Note  Patient: Anthony [name] KGY:[id number]   PCP: Rosita Fire, MD DOB: 1940/06/15   DOA: 04/12/2018   DOS: 04/20/2018   Date of Service: the patient was seen and examined on 04/20/2018 Primary service: Melida Quitter, MD   Brief hospital course: Pt. with PMH of CKD stage III, hydronephrosis status post suprapubic Foley catheter, COPD, dementia, who was admitted under ENT service for new diagnosis of lower lip squamous cell carcinoma status post total lower lip resection, bilateral neck dissection, and tracheostomy by ENT/plastic surgery, will patient recently diagnosed with a scalp malignancy, OG significant for squamous cell carcinoma, with some lymph node involvement, patient was on NG tube feed, unfortunately his NGT was dislodged overnight, surgery consulted regarding PEG tube, patient was noted to have NSVT, had low-grade temperature, had positive urine analysis, started on Bactrim, we were consulted for medical management.  Subjective: awake and some times agitated. No acute complains. In a Joking mood today.   Assessment and Plan: NSVT. -Patient had 12 beats of nonsustained V. tach earlier to 04/18/2018 -On telemetry no recurrence, asymptomatic, -Patient had recent 2D echo in 2019, with preserved EF and no regional wall motion abnormalities -Magnesium normal, potassium normal -Continue to monitor on telemetry. -Soft blood pressure holding the use of rate controlling medication.  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 -Management per primary team To resume DVT prophylaxis once patient undergoes surgery today  Scalp Malignancy -With large growth in the scalp area, it is malignant as discussed with ENT, for  surgery today.   Lung nodule 6 mm left lower lobe CT of the chest was performed, repeat CT scan of the chest recommended in 6 to 12 months interval.  Probably between July 2020-December 2020.  Low-grade fever ?UTI -pt has low grade fever, he has positive urine analysis, but this would be expected with suprapubic Foley catheter. -I have instructed staff to change his suprapubic Foley. Hopefully can be done in OR. -CT chest negative for any acute infection -Meanwhile started on Bactrim by primary (currently IV, as no source for oral intake), will continue for 5 total days. Transition to by tube once G tube in place.   Protein calorie malnutrition, severe -was on tube feed, unfortunately it was dislodged overnight, currently he is n.p.o., on IV fluids, general surgery was consulted for PEG tube placement.  CKD stage III -Function is actually better.  Will monitor.  Hypotension Will give small bolus.   Diet: npo DVT Prophylaxis: mechanical compression device  Family Communication: no family was present at bedside, at the time of interview.    Disposition: We will continue to follow the patient.   Recommendation on discharge: To be determined   Other Consultants: General surgery, plastic surgery  Procedures:  TOTAL LOWER LIP RESECTION (N/A) TRACHEOSTOMY (N/A) LOWER LIP RECONSTRUCTION WITH TISSUE REARRANGMENT OR REGIONAL FLAP (N/A) Bilateral Radical Neck Dissection (Bilateral)  Antibiotics: Anti-infectives (From admission, onward)   Start     Dose/Rate Route Frequency Ordered Stop   04/20/18 2200  [MAR Hold]  sulfamethoxazole-trimethoprim (BACTRIM) 160 mg in dextrose 5 % 250 mL IVPB     (MAR Hold since Thu 04/20/2018 at 1326. Reason: Transfer to a Procedural area.)   160 mg 260 mL/hr over 60 Minutes Intravenous Every 12 hours 04/20/18 1217  04/20/18 1000  ciprofloxacin (CIPRO) IVPB 400 mg     400 mg 200 mL/hr over 60 Minutes Intravenous To Surgery 04/19/18 1948  04/21/18 1000   04/18/18 2200  sulfamethoxazole-trimethoprim (BACTRIM) 363.04 mg of trimethoprim in dextrose 5 % 500 mL IVPB  Status:  Discontinued     10 mg/kg/day of trimethoprim  72.6 kg 348.5 mL/hr over 90 Minutes Intravenous Every 12 hours 04/18/18 1711 04/20/18 1217   04/17/18 2200  sulfamethoxazole-trimethoprim (BACTRIM,SEPTRA) 200-40 MG/5ML suspension 20 mL  Status:  Discontinued     20 mL Per Tube Every 12 hours 04/17/18 1844 04/18/18 1711   04/12/18 1000  ciprofloxacin (CIPRO) IVPB 400 mg     400 mg 200 mL/hr over 60 Minutes Intravenous On call to O.R. 04/12/18 0844 04/12/18 1039      Objective: Physical Exam: Vitals:   04/20/18 0756 04/20/18 0800 04/20/18 1000 04/20/18 1200  BP: (!) 91/48 (!) 91/48 (!) 111/52 (!) 98/46  Pulse: 72 66 65 66  Resp: (!) 21 13 17 16   Temp: 97.7 F (36.5 C)   98 F (36.7 C)  TempSrc: Axillary   Axillary  SpO2: 100% 99% 100% 98%  Weight:      Height:        Intake/Output Summary (Last 24 hours) at 04/20/2018 1332 Last data filed at 04/20/2018 1230 Gross per 24 hour  Intake 1752.49 ml  Output 2600 ml  Net -847.51 ml   Filed Weights   04/15/18 0413 04/17/18 0500 04/19/18 0337  Weight: 72.7 kg 72.6 kg 72.4 kg   General: alert awake and in no distress. Eyes: PERRL, Conjunctiva normal ENT: Oral Mucosa clear moist. Neck: difficult to assess  JVD, no Abnormal Mass Or lumps Cardiovascular: S1 and S2 Present, aortic systolic  Murmur, Peripheral Pulses Present Respiratory: normal respiratory effort, Bilateral Air entry equal and Decreased, no use of accessory muscle, bilateral  Crackles, no wheezes Abdomen: Bowel Sound present, Soft and no tenderness, no hernia Skin: no redness, no Rash, no induration Extremities: no Pedal edema, no calf tenderness Neurologic: Grossly no focal neuro deficit.  Data Reviewed: CBC: Recent Labs  Lab 04/18/18 1924 04/20/18 0301  WBC 10.9* 6.9  NEUTROABS  --  5.1  HGB 9.8* 9.7*  HCT 31.3* 31.2*  MCV 89.7  87.6  PLT 310 161   Basic Metabolic Panel: Recent Labs  Lab 04/13/18 1856 04/14/18 0234 04/14/18 1657 04/18/18 1924 04/19/18 0229 04/20/18 0301  NA  --   --   --   --  138 135  K  --   --   --   --  3.5 4.6  CL  --   --   --   --  106 107  CO2  --   --   --   --  22 20*  GLUCOSE  --   --   --   --  119* 118*  BUN  --   --   --   --  32* 23  CREATININE  --   --   --   --  1.35* 1.42*  CALCIUM  --   --   --   --  8.6* 8.3*  MG 1.8 1.8 1.9 2.2  --  2.1  PHOS 2.0* 2.3* 2.7 3.6  --   --    Liver Function Tests: Recent Labs  Lab 04/20/18 0301  AST 18  ALT 16  ALKPHOS 57  BILITOT 0.4  PROT 6.6  ALBUMIN 2.3*   No results for  input(s): LIPASE, AMYLASE in the last 168 hours. No results for input(s): AMMONIA in the last 168 hours. Cardiac Enzymes: No results for input(s): CKTOTAL, CKMB, CKMBINDEX, TROPONINI in the last 168 hours. BNP (last 3 results) Recent Labs    10/10/17 1416  BNP 22.0   CBG: Recent Labs  Lab 04/19/18 1640 04/19/18 1942 04/19/18 2337 04/20/18 0754 04/20/18 1215  GLUCAP 84 93 110* 118* 130*   Recent Results (from the past 240 hour(s))  MRSA PCR Screening     Status: Abnormal   Collection Time: 04/13/18  5:00 AM  Result Value Ref Range Status   MRSA by PCR POSITIVE (A) NEGATIVE Final    Comment:        The GeneXpert MRSA Assay (FDA approved for NASAL specimens only), is one component of a comprehensive MRSA colonization surveillance program. It is not intended to diagnose MRSA infection nor to guide or monitor treatment for MRSA infections. CRITICAL RESULT CALLED TO, READ BACK BY AND VERIFIED WITH: Rica Mote RN, AT [phone] 04/13/2018 BY Rush Landmark Performed at [facility], [place] [address] [place], [place] [address]     Studies: No results found.   Scheduled Meds: . [MAR Hold] bacitracin   Topical BID  . [MAR Hold] chlorhexidine  15 mL Mouth Rinse BID  . [MAR Hold] mouth rinse  15 mL Mouth Rinse q12n4p  . [MAR Hold]  pantoprazole (PROTONIX) IV  40 mg Intravenous Q12H  . [MAR Hold] white petrolatum   Topical BID   Continuous Infusions: . ciprofloxacin    . dextrose 5 % and 0.45 % NaCl with KCl 20 mEq/L 125 mL/hr at 04/20/18 1121  . [MAR Hold] sodium chloride    . [MAR Hold] sulfamethoxazole-trimethoprim     PRN Meds: 0.9 % irrigation (POUR BTL), lidocaine-EPINEPHrine, [MAR Hold]  morphine injection, [MAR Hold] ondansetron (ZOFRAN) IV  Time spent: 35 minutes  Author: Berle Mull, MD [facility] 04/20/2018 1:32 PM  If 7PM-7AM, please contact night-coverage at [email or web address]

## 2018-04-20 NOTE — Progress Notes (Signed)
PHARMACY NOTE:  ANTIMICROBIAL RENAL DOSAGE ADJUSTMENT  Current antimicrobial regimen includes a mismatch between antimicrobial dosage and estimated renal function.  As per policy approved by the Pharmacy & Therapeutics and Medical Executive Committees, the antimicrobial dosage will be adjusted accordingly.  Current antimicrobial dosage:  Bactrim 10mg /kg/day IV q12h  Indication: UTI  Renal Function:  Estimated Creatinine Clearance: 43.2 mL/min (A) (by C-G formula based on SCr of 1.42 mg/dL (H)). []      On intermittent HD, scheduled: []      On CRRT    Antimicrobial dosage has been changed to:  Bactrim 160mg  IV q12h  Additional comments:   Esmeralda Malay A. Levada Dy, PharmD, [place] Pager: [phone] Please utilize Amion for appropriate phone number to reach the unit pharmacist ([place])   04/20/2018 12:15 PM

## 2018-04-20 NOTE — Anesthesia Postprocedure Evaluation (Signed)
Anesthesia Post Note  Patient: Anthony [name]  Procedure(s) Performed: WIDE SCALP EXCISION SKIN CANCER X 7 CM (N/A Scalp) ATTEMPTED ENDOSCOPIC GASTROSTOMY (N/A Abdomen) REVISION LOWER LIP FLAP AND SCALP FLAP (N/A Head)     Patient location during evaluation: PACU Anesthesia Type: General Level of consciousness: awake Pain management: pain level controlled Vital Signs Assessment: post-procedure vital signs reviewed and stable Respiratory status: spontaneous breathing, nonlabored ventilation, respiratory function stable and patient connected to T-piece oxygen Cardiovascular status: blood pressure returned to baseline and stable Postop Assessment: no apparent nausea or vomiting Anesthetic complications: no    Last Vitals:  Vitals:   04/20/18 1833 04/20/18 1911  BP: (!) 95/49 (!) 99/58  Pulse: 66 70  Resp: 16 13  Temp: (!) 36.4 C (!) 36.4 C  SpO2: 100% 100%    Last Pain:  Vitals:   04/20/18 1911  TempSrc: Axillary  PainSc:                  Anthony [name]

## 2018-04-20 NOTE — Interval H&P Note (Signed)
History and Physical Interval Note:  04/20/2018 1:19 PM  Anthony [name]  has presented today for surgery, with the diagnosis of SKIN CANCER ON THE SCALP  The various methods of treatment have been discussed with the patient and family. After consideration of risks, benefits and other options for treatment, the patient has consented to  Procedure(s): WIDE SCALP EXCISION SKIN CANCER (N/A) LAPAROSCOPIC GASTROSTOMY (N/A) REVISION LOWER LIP FLAP AND SCALP FLAP (N/A) as a surgical intervention .  The patient's history has been reviewed, patient examined, no change in status, stable for surgery.  I have reviewed the patient's chart and labs.  Questions were answered to the patient's satisfaction.     Loel Lofty Dillingham

## 2018-04-20 NOTE — Transfer of Care (Signed)
Immediate Anesthesia Transfer of Care Note  Patient: Anthony [name]  Procedure(s) Performed: WIDE SCALP EXCISION SKIN CANCER X 7 CM (N/A Scalp) ATTEMPTED ENDOSCOPIC GASTROSTOMY (N/A Abdomen) REVISION LOWER LIP FLAP AND SCALP FLAP (N/A Head)  Patient Location: PACU  Anesthesia Type:General  Level of Consciousness: drowsy and patient cooperative  Airway & Oxygen Therapy: Patient Spontanous Breathing and Patient connected to tracheostomy mask oxygen  Post-op Assessment: Report given to RN and Post -op Vital signs reviewed and stable  Post vital signs: Reviewed and stable  Last Vitals:  Vitals Value Taken Time  BP 93/46 04/20/2018  5:07 PM  Temp    Pulse 63 04/20/2018  5:09 PM  Resp 23 04/20/2018  5:09 PM  SpO2 97 % 04/20/2018  5:09 PM  Vitals shown include unvalidated device data.  Last Pain:  Vitals:   04/20/18 1200  TempSrc: Axillary  PainSc:       Patients Stated Pain Goal: 3 (88/71/95 9747)  Complications: No apparent anesthesia complications

## 2018-04-20 NOTE — Op Note (Signed)
Preoperative diagnosis: malnutrition  Postoperative diagnosis: same   Procedure: upper endoscopy, aborted P.E.G insertion  Surgeon: Gurney Maxin, M.D.  Asst: Dimitri Ped  Anesthesia: general  Indications for procedure: Anthony [name] is a 79 y.o. year old male with squamous cell cancer of the lip. He underwent resection and has been having trouble eating, therefore decision was made to place a feeding tube via endoscopy.  Description of procedure: The patient was brought into the operative suite. Anesthesia was administered with General endotracheal anesthesia. WHO checklist was applied. The patient was then placed in supine position. The area was prepped and draped in the usual sterile fashion.  Initially, an endoscope was inserted however the cart given did not have insufflation available. The scope was then removed and a new cart obtained. The endoscope was then easily introduced into the mouth and advanced into the esophagus. He had a moderate sized hiatal hernia. The stomach was distended. Transillumination was attempted as well as localization with abdominal palpation. Neither of these methods were able to confirm location for safe access. Therefore, the procedure was terminated and the endoscope was withdrawn and plan for laparoscopic gastrostomy tube insertion was made in the near future.  Findings: moderate hiatal hernia  Specimen: none  Implant: none   Blood loss: minimal  Local anesthesia: none  Complications: unable to complete gastrostomy tube insertion  Gurney Maxin, M.D. General, Bariatric, & Minimally Invasive Surgery [facility]

## 2018-04-20 NOTE — Progress Notes (Signed)
Pre Procedure note for inpatients:   Anthony [name] has been scheduled for PEG insertion today. The various methods of treatment have been discussed with the patient. After consideration of the risks, benefits and treatment options the patient has consented to the planned procedure.   The patient has been seen and labs reviewed. There are no changes in the patient's condition to prevent proceeding with the planned procedure today.  Recent labs:  Lab Results  Component Value Date   WBC 6.9 04/20/2018   HGB 9.7 (L) 04/20/2018   HCT 31.2 (L) 04/20/2018   PLT 287 04/20/2018   GLUCOSE 118 (H) 04/20/2018   ALT 16 04/20/2018   AST 18 04/20/2018   NA 135 04/20/2018   K 4.6 04/20/2018   CL 107 04/20/2018   CREATININE 1.42 (H) 04/20/2018   BUN 23 04/20/2018   CO2 20 (L) 04/20/2018   INR 1.00 01/26/2018   MICROALBUR 6.1 (H) 10/12/2017    [name], MD 04/20/2018 11:30 AM

## 2018-04-20 NOTE — Progress Notes (Addendum)
Nutrition Follow Up  DOCUMENTATION CODES:   Severe malnutrition in context of chronic illness  INTERVENTION:    Once G-tube ready to be used:  Initiate Osmolite 1.2 formula at 25 ml/hr and increase by 10 ml every 12 hours to goal rate of 75 ml/hr  Free water flushes at 200 ml QID   Total provides 2160 kcals, 100 gm protein, 1476 ml free water daily  Monitor magnesium, potassium, and phosphorus daily for at least 3 days, MD to replete as needed, as pt is at risk for refeeding syndrome given malnutrition  NUTRITION DIAGNOSIS:   Severe Malnutrition related to chronic illness(lip cancer) as evidenced by energy intake < or equal to 75% for > or equal to 1 month, severe fat depletion, severe muscle depletion, ongoing  GOAL:   Patient will meet greater than or equal to 90% of their needs, progressing   MONITOR:   TF tolerance, Labs, Skin, Weight trends, I & O's  ASSESSMENT:   78 yo male with cancer of external lower lip that has been affecting ability to eat admitted for surgery.  Biopsy confirms squamous cell cancer with imaging confirming enlarged nodes bilaterally. Pt also with sizeable tumor on the right vertex of scalp that has been removed twice previously. PMH includes COPD, CKD, dementia, dysphagia  1/29 total lower lip resection, bilateral selective neck dissections and trach placement; plastics reconstructed lower lip defect, NG tube placed  Pt transferred out of [facility] 1/30. He is a ward of the state and resides at [facility]. He has been wheelchair bound for about 6 years or so.  Pt's NGT became dislodged 2/3; TF was stopped. Currently in OR for G-tube, lip & scalp revisions. Speech Path consulted; following for PMSV.  Labs & medications reviewed.  CBG's 110-118-130. Weight stable.  Diet Order:   Diet Order            Diet NPO time specified  Diet effective now             EDUCATION NEEDS:   Not appropriate for education at this time  Skin:  Skin  Assessment: Skin Integrity Issues: Skin Integrity Issues:: Incisions, Other (Comment) Incisions: neck, ching, mouth, ankle Other: mass on head (black and draining pus per RN)  Last BM:  2/5  Height:   Ht Readings from Last 1 Encounters:  04/20/18 6\' 3"  (1.905 m)   Weight:   Wt Readings from Last 1 Encounters:  04/20/18 72.4 kg   BMI:  Body mass index is 19.95 kg/m.  Estimated Nutritional Needs:   Kcal:  2000-2200  Protein:  95-110 gm  Fluid:  2.0-2.2 L  Arthur Holms, RD, LDN Pager #: [phone] After-Hours Pager #: [phone]

## 2018-04-20 NOTE — Progress Notes (Signed)
SLP Cancellation Note  Patient Details Name: Anthony [name] MRN: [id number] DOB: 1941/02/18   Cancelled treatment:       Reason Eval/Treat Not Completed: Patient at procedure or test/unavailable   Haddon Fyfe, Katherene Ponto 04/20/2018, 2:11 PM

## 2018-04-20 NOTE — Brief Op Note (Signed)
04/12/2018 - 04/20/2018  3:16 PM  PATIENT:  Anthony [name]  78 y.o. male  PRE-OPERATIVE DIAGNOSIS:  SKIN CANCER ON THE SCALP, LIP, MALNUTRITION  POST-OPERATIVE DIAGNOSIS:  SKIN CANCER ON THE SCALP, LIP, MALNUTRITION  PROCEDURE:  Procedure(s): WIDE SCALP EXCISION SKIN CANCER X 7CM (N/A) LAPAROSCOPIC GASTROSTOMY (N/A) REVISION LOWER LIP FLAP AND SCALP FLAP (N/A)  SURGEON:  Surgeon(s) and Role: Panel 1:    Melida Quitter, MD - Primary Panel 2:    * Kinsinger, Arta Bruce, MD - Primary Panel 3:    * Dillingham, Loel Lofty, DO - Primary  PHYSICIAN ASSISTANT:   ASSISTANTS: none   ANESTHESIA:   general  EBL:  Per anesthesia record  BLOOD ADMINISTERED:none  DRAINS: none   LOCAL MEDICATIONS USED:  LIDOCAINE   SPECIMEN:  Source of Specimen:  Right vortex scalp with margins  DISPOSITION OF SPECIMEN:  PATHOLOGY  COUNTS:  YES  TOURNIQUET:  * No tourniquets in log *  DICTATION: .Other Dictation: Dictation Number [id number]  PLAN OF CARE: Return to hospital room  PATIENT DISPOSITION:  PACU - hemodynamically stable.   Delay start of Pharmacological VTE agent (>24hrs) due to surgical blood loss or risk of bleeding: no

## 2018-04-21 ENCOUNTER — Inpatient Hospital Stay (HOSPITAL_COMMUNITY): Payer: Medicare Other | Admitting: Anesthesiology

## 2018-04-21 ENCOUNTER — Encounter (HOSPITAL_COMMUNITY)
Admission: RE | Disposition: A | Discharge status: Skilled Nursing Facility | Payer: Self-pay | Source: Skilled Nursing Facility | Attending: Otolaryngology

## 2018-04-21 ENCOUNTER — Encounter (HOSPITAL_COMMUNITY): Payer: Self-pay | Admitting: Otolaryngology

## 2018-04-21 DIAGNOSIS — Z930 Tracheostomy status: Secondary | ICD-10-CM

## 2018-04-21 HISTORY — PX: LAPAROSCOPIC GASTROSTOMY: SHX5896

## 2018-04-21 LAB — BASIC METABOLIC PANEL
Anion gap: 9 (ref 5–15)
BUN: 15 mg/dL (ref 8–23)
CO2: 20 mmol/L — ABNORMAL LOW (ref 22–32)
Calcium: 8.2 mg/dL — ABNORMAL LOW (ref 8.9–10.3)
Chloride: 108 mmol/L (ref 98–111)
Creatinine, Ser: 1.46 mg/dL — ABNORMAL HIGH (ref 0.61–1.24)
GFR calc Af Amer: 52 mL/min — ABNORMAL LOW (ref >60)
GFR calc non Af Amer: 45 mL/min — ABNORMAL LOW (ref >60)
Glucose, Bld: 129 mg/dL — ABNORMAL HIGH (ref 70–99)
Potassium: 4.8 mmol/L (ref 3.5–5.1)
Sodium: 137 mmol/L (ref 135–145)

## 2018-04-21 LAB — CBC
HCT: 26.7 % — ABNORMAL LOW (ref 39.0–52.0)
Hemoglobin: 8.4 g/dL — ABNORMAL LOW (ref 13.0–17.0)
MCH: 27.9 pg (ref 26.0–34.0)
MCHC: 31.5 g/dL (ref 30.0–36.0)
MCV: 88.7 fL (ref 80.0–100.0)
Platelets: 286 10*3/uL (ref 150–400)
RBC: 3.01 MIL/uL — ABNORMAL LOW (ref 4.22–5.81)
RDW: 15.5 % (ref 11.5–15.5)
WBC: 8.9 10*3/uL (ref 4.0–10.5)
nRBC: 0 % (ref 0.0–0.2)

## 2018-04-21 LAB — GLUCOSE, CAPILLARY
Glucose-Capillary: 166 mg/dL — ABNORMAL HIGH (ref 70–99)
Glucose-Capillary: 126 mg/dL — ABNORMAL HIGH (ref 70–99)
Glucose-Capillary: 108 mg/dL — ABNORMAL HIGH (ref 70–99)
Glucose-Capillary: 91 mg/dL (ref 70–99)
Glucose-Capillary: 82 mg/dL (ref 70–99)
Glucose-Capillary: 87 mg/dL (ref 70–99)

## 2018-04-21 SURGERY — CREATION, GASTROSTOMY, LAPAROSCOPIC
Anesthesia: General | Site: Abdomen

## 2018-04-21 MED ORDER — 0.9 % SODIUM CHLORIDE (POUR BTL) OPTIME
TOPICAL | Status: DC | PRN
  Administered 2018-04-21: 1000 mL

## 2018-04-21 MED ORDER — LIDOCAINE 2% (20 MG/ML) 5 ML SYRINGE
INTRAMUSCULAR | Status: AC
  Filled 2018-04-21: qty 5

## 2018-04-21 MED ORDER — SODIUM CHLORIDE 0.9 % IV SOLN
INTRAVENOUS | Status: DC | PRN
  Administered 2018-04-21: 50 ug/min via INTRAVENOUS

## 2018-04-21 MED ORDER — FENTANYL CITRATE (PF) 100 MCG/2ML IJ SOLN
25.0000 ug | INTRAMUSCULAR | Status: DC | PRN
  Administered 2018-04-21 (×2): 50 ug via INTRAVENOUS

## 2018-04-21 MED ORDER — PROMETHAZINE HCL 25 MG/ML IJ SOLN: 6.2500 mg | INTRAMUSCULAR | Status: DC | PRN

## 2018-04-21 MED ORDER — ONDANSETRON HCL 4 MG/2ML IJ SOLN
INTRAMUSCULAR | Status: AC
  Filled 2018-04-21: qty 2

## 2018-04-21 MED ORDER — JEVITY 1.2 CAL PO LIQD
1000.0000 mL | ORAL | Status: DC
  Filled 2018-04-21: qty 1000

## 2018-04-21 MED ORDER — STERILE WATER FOR IRRIGATION IR SOLN
Status: DC | PRN
  Administered 2018-04-21: 1000 mL

## 2018-04-21 MED ORDER — BUPIVACAINE-EPINEPHRINE 0.25% -1:200000 IJ SOLN
INTRAMUSCULAR | Status: DC | PRN
  Administered 2018-04-21: 10 mL

## 2018-04-21 MED ORDER — FENTANYL CITRATE (PF) 100 MCG/2ML IJ SOLN: 25.0000 ug | INTRAMUSCULAR | Status: DC | PRN

## 2018-04-21 MED ORDER — LACTATED RINGERS IV SOLN
INTRAVENOUS | Status: DC
  Administered 2018-04-21 – 2018-04-22 (×2): via INTRAVENOUS

## 2018-04-21 MED ORDER — SUGAMMADEX SODIUM 200 MG/2ML IV SOLN
INTRAVENOUS | Status: DC | PRN
  Administered 2018-04-21: 200 mg via INTRAVENOUS

## 2018-04-21 MED ORDER — ROCURONIUM BROMIDE 50 MG/5ML IV SOSY
PREFILLED_SYRINGE | INTRAVENOUS | Status: AC
  Filled 2018-04-21: qty 10

## 2018-04-21 MED ORDER — OSMOLITE 1.2 CAL PO LIQD
1000.0000 mL | ORAL | Status: DC
  Administered 2018-04-21: 1000 mL
  Filled 2018-04-21: qty 1000

## 2018-04-21 MED ORDER — FENTANYL CITRATE (PF) 250 MCG/5ML IJ SOLN
INTRAMUSCULAR | Status: AC
  Filled 2018-04-21: qty 5

## 2018-04-21 MED ORDER — FENTANYL CITRATE (PF) 100 MCG/2ML IJ SOLN
INTRAMUSCULAR | Status: AC
  Filled 2018-04-21: qty 2

## 2018-04-21 MED ORDER — PROPOFOL 10 MG/ML IV BOLUS
INTRAVENOUS | Status: AC
  Filled 2018-04-21: qty 20

## 2018-04-21 MED ORDER — FENTANYL CITRATE (PF) 250 MCG/5ML IJ SOLN
INTRAMUSCULAR | Status: DC | PRN
  Administered 2018-04-21 (×2): 50 ug via INTRAVENOUS

## 2018-04-21 MED ORDER — ROCURONIUM BROMIDE 100 MG/10ML IV SOLN
INTRAVENOUS | Status: DC | PRN
  Administered 2018-04-21: 50 mg via INTRAVENOUS

## 2018-04-21 MED ORDER — DEXAMETHASONE SODIUM PHOSPHATE 10 MG/ML IJ SOLN
INTRAMUSCULAR | Status: AC
  Filled 2018-04-21: qty 1

## 2018-04-21 SURGICAL SUPPLY — 32 items
BAG URINE DRAINAGE (UROLOGICAL SUPPLIES) ×2 IMPLANT
BLADE CLIPPER SURG (BLADE) ×2 IMPLANT
CHLORAPREP W/TINT 26ML (MISCELLANEOUS) ×3 IMPLANT
COVER SURGICAL LIGHT HANDLE (MISCELLANEOUS) ×3 IMPLANT
COVER WAND RF STERILE (DRAPES) ×3 IMPLANT
DECANTER SPIKE VIAL GLASS SM (MISCELLANEOUS) ×3 IMPLANT
DERMABOND ADVANCED (GAUZE/BANDAGES/DRESSINGS) ×2
DERMABOND ADVANCED .7 DNX12 (GAUZE/BANDAGES/DRESSINGS) ×1 IMPLANT
ELECT REM PT RETURN 9FT ADLT (ELECTROSURGICAL) ×3
ELECTRODE REM PT RTRN 9FT ADLT (ELECTROSURGICAL) ×1 IMPLANT
GLOVE BIOGEL PI IND STRL 7.0 (GLOVE) ×1 IMPLANT
GLOVE BIOGEL PI INDICATOR 7.0 (GLOVE) ×2
GLOVE SURG SS PI 7.0 STRL IVOR (GLOVE) ×3 IMPLANT
GOWN STRL REUS W/ TWL LRG LVL3 (GOWN DISPOSABLE) ×3 IMPLANT
GOWN STRL REUS W/TWL LRG LVL3 (GOWN DISPOSABLE) ×6
KIT BASIN OR (CUSTOM PROCEDURE TRAY) ×3 IMPLANT
KIT TURNOVER KIT B (KITS) ×3 IMPLANT
NEEDLE 22X1 1/2 (OR ONLY) (NEEDLE) ×3 IMPLANT
NS IRRIG 1000ML POUR BTL (IV SOLUTION) ×3 IMPLANT
PAD ARMBOARD 7.5X6 YLW CONV (MISCELLANEOUS) ×6 IMPLANT
SCISSORS LAP 5X35 DISP (ENDOMECHANICALS) ×2 IMPLANT
SET ANCHOR SUT GIAS 100 (SET/KITS/TRAYS/PACK) ×4 IMPLANT
SET GASTRO INIT PLACEMENT MED (SET/KITS/TRAYS/PACK) ×2 IMPLANT
SET SUT ANCHOR GASTROINTEST (SET/KITS/TRAYS/PACK) ×4 IMPLANT
SET TUBE SMOKE EVAC HIGH FLOW (TUBING) ×3 IMPLANT
SLEEVE ENDOPATH XCEL 5M (ENDOMECHANICALS) ×3 IMPLANT
SUT ETHILON 2 0 FS 18 (SUTURE) ×2 IMPLANT
SUT MNCRL AB 4-0 PS2 18 (SUTURE) ×3 IMPLANT
TRAY LAPAROSCOPIC MC (CUSTOM PROCEDURE TRAY) ×3 IMPLANT
TROCAR XCEL NON-BLD 5MMX100MML (ENDOMECHANICALS) ×3 IMPLANT
TUBE GASTROSTOMY ENTUIT 18FR (TUBING) ×2 IMPLANT
WATER STERILE IRR 1000ML POUR (IV SOLUTION) ×3 IMPLANT

## 2018-04-21 NOTE — Social Work (Signed)
Received a call stating no contact for pt guardian was valid.  Reached out to [facility] with [facility] to obtain new contact information.  Westley Hummer, MSW, [place] [employer] [phone]

## 2018-04-21 NOTE — Progress Notes (Signed)
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  Also during the same procedure we placed Acell on scalp wound and did re advancement of the pts lower lip flap. Pt denies pain today.  He says he is tired. He is curled up in his bed.  He is able to communicate verbally but is sometimes difficult to understand.  Objective: Vital signs in last 24 hours: Temp:  [97.3 F (36.3 C)-98.9 F (37.2 C)] 98.9 F (37.2 C) (02/07 0824) Pulse Rate:  [64-84] 64 (02/07 1105) Resp:  [12-21] 14 (02/07 1105) BP: (90-100)/(43-70) 94/60 (02/07 0824) SpO2:  [93 %-100 %] 100 % (02/07 1105) FiO2 (%):  [28 %-29 %] 28 % (02/07 1105) Weight:  [72.4 kg] 72.4 kg (02/06 1337) Weight change:  Last BM Date: 04/20/18  Intake/Output from previous day: 02/06 0701 - 02/07 0700 In: 3320.1 [I.V.:2526.2; IV Piggyback:794] Out: 4827 [Urine:3175; Blood:50] Intake/Output this shift: Total I/O In: 0  Out: 500 [Urine:500]  The pt is alert Trach in place Pt is laying on his right side curled up in his bed Head wound is still covered by bandages No breakdown noted of the lip sutures ROM present in all 4 extremities  Lab Results: Recent Labs    04/20/18 0301 04/21/18 0311  WBC 6.9 8.9  HGB 9.7* 8.4*  HCT 31.2* 26.7*  PLT 287 286   BMET Recent Labs    04/20/18 0301 04/21/18 0311  NA 135 137  K 4.6 4.8  CL 107 108  CO2 20* 20*  GLUCOSE 118* 129*  BUN 23 15  CREATININE 1.42* 1.46*  CALCIUM 8.3* 8.2*    Studies/Results: No results found.  Medications: I have reviewed the patient's current medications.  Assessment/Plan: We will continue to monitor patient  LOS: 9 days    Melida Gimenez, [facility] 04/21/2018

## 2018-04-21 NOTE — Progress Notes (Signed)
Called to pt's bedside by RN.  Pt pulled out his trach again.  Trach reinserted with no complications.  Pt placed back on ATC and trach care performed at this time.  No resp distress noted at this time but pt is complaining of pain.  RNs at bedside and are aware.  Pt currently has mitt on Rt hand.  Will cont to monitor.

## 2018-04-21 NOTE — Op Note (Signed)
Preoperative diagnosis: malnutrition  Postoperative diagnosis: same   Procedure: laparoscopic gastrostomy tube insertion  Surgeon: Gurney Maxin, M.D.  Asst: none  Anesthesia: general  Indications for procedure: Anthony [name] is a 78 y.o. year old male with symptoms of squamous cell cancer status post resection with difficulty swallowing. Decision was made for feeding tube.  Description of procedure: The patient was brought into the operative suite. Anesthesia was administered with General endotracheal anesthesia. WHO checklist was applied. The patient was then placed in supine position. The area was prepped and draped in the usual sterile fashion.  Next, a small incision was made to the right subcostal area and 5 mm trocar was used to gain entrance to the peritoneal cavity by optical technique.  Pneumoperitoneum was applied with high flow low pressure.  Laparoscope was reinserted to confirm placement.  Looking at the abdomen the stomach was visualized in the left upper quadrant.  1 additional 5 mm trocar was placed in the right lower quadrant.  Site for skin was chosen and small incision was made.  Next T bars were inserted into the inferior portion of the body of the stomach in 3 areas and brought up partially to the skin. There was difficulty with one of the T bars and I attempted to remove it but it got stuck in the subcutaneous area and was left in that position.  Next a 14-gauge catheter was introduced via the skin incision into the stomach and a J-wire was advanced without resistance.  The area was then dilated up to a 20 [place] size and a 74 [place] G-tube was introduced via peel-away sheath.  The balloon of the G-tube was filled with 10 mL of saline.  G-tube was snugged up to the skin which was 2-1/2 at the skin.  T bars were fastened down.  Pneumoperitoneum was removed.  All trochars were removed.  All counts were correct.  All skin incisions were closed with 4-0 Monocryl subcuticular  stitch.  Suprapubic catheter was exchanged with a 18 French Foley catheter without difficulty and the balloon was reinflated with 10 mL of saline.  Patient then awoke from anesthesia was brought to PACU in stable condition.  Findings: Patent G-tube  Specimen: None  Implant: 34 French G-tube to the left upper quadrant, 18 French Foley to the suprapubic area  Blood loss: <30 mL  Local anesthesia: 10 ml marcaine   Complications: none  Gurney Maxin, M.D. General, Bariatric, & Minimally Invasive Surgery [facility]

## 2018-04-21 NOTE — Anesthesia Postprocedure Evaluation (Addendum)
Anesthesia Post Note  Patient: Anthony [name]  Procedure(s) Performed: LAPAROSCOPIC GASTROSTOMY WITH G-TUBE PLACEMENT (N/A Abdomen)     Patient location during evaluation: PACU Anesthesia Type: General Level of consciousness: sedated Pain management: pain level controlled Vital Signs Assessment: post-procedure vital signs reviewed and stable Respiratory status: spontaneous breathing and respiratory function stable Cardiovascular status: stable Postop Assessment: no apparent nausea or vomiting Anesthetic complications: no    Last Vitals:  Vitals:   04/21/18 1918 04/21/18 1925  BP: (!) 93/49   Pulse: 63   Resp: 16   Temp: 36.8 C   SpO2: 100% 100%    Last Pain:  Vitals:   04/21/18 1918  TempSrc: Oral  PainSc:                  [name] DANIEL

## 2018-04-21 NOTE — Progress Notes (Signed)
PROGRESS NOTE  Anthony [name] WHQ:[id number] DOB: Apr 18, 1940 DOA: 04/12/2018 PCP: Rosita Fire, MD  Brief History   Pt. with PMH of CKD stage III, hydronephrosis status post suprapubic Foley catheter, COPD, dementia, who was admitted under ENT service for new diagnosis of lower lip squamous cell carcinoma status post total lower lip resection, bilateral neck dissection, and tracheostomy by ENT/plastic surgery,will patient recently diagnosed with a scalp malignancy,OG significant for squamous cell carcinoma, with some lymph node involvement, patient was on NG tube feed, unfortunately his NGT was dislodged overnight, surgery consulted regarding PEG tube, patient was noted to have NSVT, had low-grade temperature, had positive urine analysis, started on Bactrim, we were consulted for medical management.   A & P   NSVT: No recurrence of 2/4/20202 12 beat of NSVT on telemetry. Patient was asymptomatic. Echo performed in 09/2017 demonstrated a preserved EF and no regional wall motion abnormalities. Will monitor electrolytes and replace as necessary. Continue telemetry. Rate controlling meds held due to low blood pressures. Monitor.  Squamous cell carcinoma of lower lip: S/P tracheostomy. Management per primary ENT team, status post lower lip resection, tracheostomy, with bilateral radical neck dissection, with lower lip reconstruction with tissue rearrangement with regional flap done by ENT and plastic surgery.Management per primary team.  Scalp Malignancy: With large growth in the scalp area, it is malignant as discussed with ENT, Surgery on 04/20/2018.   Lung nodule 6 mm left lower lobe: CT of the chest was performed, repeat CT scan of the chest recommended in 6 to 12 months interval.  Probably between July 2020-December 2020.  Low-grade fever: Resolved following replacement of suprapubic foley. Bactrim started by primary service. CT chest reveals no source of infection.  Protein calorie  malnutrition, severe: PEG tube placed by general surgery today. Continue tube feeds per their recommendation. Patient is NPO.  CKD stage III: Function is actually better.  Will monitor.  Hypotension: Will give small bolus. Blood pressures are a little better.  Diet: npo, tube feeds once PEG in. DVT Prophylaxis: mechanical compression device Family Communication: Betsey Amen is legal guardian. [phone] CODE STATUS: Full Code Disposition: tbd   Armoni Kludt, DO [facility] Direct contact: see [email or web address]  7PM-7AM contact night coverage as above 04/21/2018, 5:53 PM  LOS: 9 days    Interval History/Subjective  The patient is sleeping soundly and is not awakened. No acute distress.  Objective   Vitals:  Vitals:   04/21/18 1548 04/21/18 1721  BP:  98/77  Pulse: 68 62  Resp: 15 14  Temp:  97.8 F (36.6 C)  SpO2: 99% 100%    Exam:  Constitutional:  . Pt is sleeping soundly and is not awakened. No acute distress. Respiratory:  . No wheezes, rales, or rhonchi.  . No increased work of breathing. . No tactile fremitus. Cardiovascular:  . Regular rate and rhythm. No murmurs, ectopy, or gallups.  . No LE extremity edema   . Normal pedal pulses Abdomen:  . Abdomen is soft, non-tender, non-distended. . No hernias, organomegaly, or masses are appreciated. . Normoactive bowel sounds.  Musculoskeletal:  . Digits/nails BUE: no clubbing, cyanosis, petechiae, infection . exam of joints, bones, muscles of at least one of following: head/neck, RUE, LUE, RLE, LLE   o strength and tone normal, no atrophy, no abnormal movements o No tenderness, masses o Normal ROM, no contractures  Skin:  . No rashes, lesions, ulcers . palpation of skin: no induration or nodules Neurologic:  Unable to evaluate due to the  patient's inability to cooperate with exam. Psychiatric:  Unable to evaluate due to the patient's inability to cooperate with exam.  I have seen and examined  this patient myself. I have spent 35 minutes in his evaluation and care.  I have personally reviewed the following:   Today's Data  . Laboratory data and vitals.   Scheduled Meds: . bacitracin   Topical BID  . chlorhexidine  15 mL Mouth Rinse BID  . fentaNYL      . mouth rinse  15 mL Mouth Rinse q12n4p  . pantoprazole (PROTONIX) IV  40 mg Intravenous Q12H  . white petrolatum   Topical BID   Continuous Infusions: . dextrose 5 % and 0.45 % NaCl with KCl 20 mEq/L 125 mL/hr at 04/21/18 0659  . feeding supplement (OSMOLITE 1.2 CAL)    . lactated ringers Stopped (04/20/18 1701)  . lactated ringers 50 mL/hr at 04/21/18 1309  . sulfamethoxazole-trimethoprim 160 mg (04/21/18 0931)    Active Problems:   Tracheostomy in place [facility])   Cancer of lower lip   Protein-calorie malnutrition, severe   LOS: 9 days

## 2018-04-21 NOTE — Transfer of Care (Signed)
Immediate Anesthesia Transfer of Care Note  Patient: Anthony [name]  Procedure(s) Performed: LAPAROSCOPIC GASTROSTOMY WITH G-TUBE PLACEMENT (N/A Abdomen)  Patient Location: PACU  Anesthesia Type:General  Level of Consciousness: awake, alert  and oriented  Airway & Oxygen Therapy: Patient Spontanous Breathing and Patient connected to tracheostomy mask oxygen  Post-op Assessment: Report given to RN, Post -op Vital signs reviewed and stable and Patient moving all extremities X 4  Post vital signs: Reviewed and stable  Last Vitals:  Vitals Value Taken Time  BP 124/60 04/21/2018  2:39 PM  Temp 36.6 C 04/21/2018  2:39 PM  Pulse    Resp 20 04/21/2018  2:42 PM  SpO2    Vitals shown include unvalidated device data.  Last Pain:  Vitals:   04/21/18 1439  TempSrc:   PainSc: 0-No pain      Patients Stated Pain Goal: 3 (70/17/79 3903)  Complications: No apparent anesthesia complications

## 2018-04-21 NOTE — Progress Notes (Signed)
   Subjective:    Patient ID: Anthony [name], male    DOB: February 14, 1941, 78 y.o.   MRN: [id number]  HPI Doing well.  Less scalp pain.  Review of Systems     Objective:   Physical Exam AF VSS Alert, NAD Lower lip reconstruction looks good, scalp reconstruction in place Neck incision clean and intact Trach site stable with #6 cuffless Shiley     Assessment & Plan:  Lower lip and scalp cancers s/p resections, reconstructions, bilateral neck dissections, and tracheostomy  General surgery plans to place G-tube today.  Will resume tube feeding per their recommendations.  Doing well otherwise.  Likely plan discharge sometime next week.

## 2018-04-21 NOTE — Progress Notes (Signed)
OT Cancellation Note  Patient Details Name: Anthony [name] MRN: [id number] DOB: 06-28-1940   Cancelled Treatment:    Reason Eval/Treat Not Completed: Patient at procedure or test/ unavailable. Pt in OR for G tube placement. Will check back next appropriate/available time  Britt Bottom 04/21/2018, 2:17 PM

## 2018-04-21 NOTE — Anesthesia Procedure Notes (Signed)
Date/Time: 04/21/2018 1:28 PM Performed by: Mariea Clonts, CRNA Pre-anesthesia Checklist: Patient identified, Emergency Drugs available, Suction available, Patient being monitored and Timeout performed Patient Re-evaluated:Patient Re-evaluated prior to induction Oxygen Delivery Method: Circle system utilized Preoxygenation: Pre-oxygenation with 100% oxygen Induction Type: Combination inhalational/ intravenous induction Airway Equipment and Method: Tracheostomy Placement Confirmation: positive ETCO2 and breath sounds checked- equal and bilateral

## 2018-04-21 NOTE — Anesthesia Preprocedure Evaluation (Addendum)
Anesthesia Evaluation  Patient identified by MRN, date of birth, ID band Patient awake    Reviewed: Allergy & Precautions, NPO status , Patient's Chart, lab work & pertinent test results  History of Anesthesia Complications Negative for: history of anesthetic complications  Airway Mallampati: Trach      Comment: Surgeon requires muscle relaxant, will replace cuffless trach with ett during procedure. Dental   Pulmonary asthma , COPD, former smoker,    Pulmonary exam normal        Cardiovascular hypertension, Normal cardiovascular exam     Neuro/Psych PSYCHIATRIC DISORDERS Anxiety Depression Dementia negative neurological ROS     GI/Hepatic Neg liver ROS, GERD  ,  Endo/Other  negative endocrine ROS  Renal/GU Renal InsufficiencyRenal disease Bladder dysfunction (BOO with suprapubic catheter)      Musculoskeletal negative musculoskeletal ROS (+)   Abdominal   Peds  Hematology  (+) anemia ,   Anesthesia Other Findings   Reproductive/Obstetrics                           Anesthesia Physical  Anesthesia Plan  ASA: III  Anesthesia Plan: General   Post-op Pain Management:    Induction: Intravenous and Inhalational  PONV Risk Score and Plan: 2 and Ondansetron, Dexamethasone and Treatment may vary due to age or medical condition  Airway Management Planned: Tracheostomy  Additional Equipment: None  Intra-op Plan:   Post-operative Plan: Post-operative intubation/ventilation  Informed Consent: I have reviewed the patients History and Physical, chart, labs and discussed the procedure including the risks, benefits and alternatives for the proposed anesthesia with the patient or authorized representative who has indicated his/her understanding and acceptance.       Plan Discussed with: CRNA, Anesthesiologist and Surgeon  Anesthesia Plan Comments:       Anesthesia Quick Evaluation

## 2018-04-21 NOTE — Progress Notes (Signed)
Pt has removed Trach Mask.  States that he can't breath with it on.  RT reiterated the importance of wearing ATC to previous hardening of secretions.  Readjusted ATC to provide humidity.  Will cont to monitor.

## 2018-04-21 NOTE — Progress Notes (Signed)
PT Cancellation Note  Patient Details Name: Anthony [name] MRN: [id number] DOB: May 10, 1940   Cancelled Treatment:    Reason Eval/Treat Not Completed: Patient at procedure or test/unavailable   Reinaldo Berber, PT, DPT Acute Rehabilitation Services Pager: [phone] Office: [phone]   Reinaldo Berber 04/21/2018, 1:03 PM

## 2018-04-21 NOTE — Progress Notes (Signed)
Speech pathology cancellation note:  Speech tx canceled - pt in OR for Gtube placement. Will continue efforts.   Anthony [name] L. [name], [place] Office number [phone] Pager [phone]

## 2018-04-21 NOTE — Progress Notes (Signed)
Betsey Amen, Guardianship Social Work Rep gave consent for Laparoscopic gastrostomy tube placement that's scheduled for today.

## 2018-04-22 DIAGNOSIS — G9341 Metabolic encephalopathy: Secondary | ICD-10-CM

## 2018-04-22 DIAGNOSIS — I472 Ventricular tachycardia: Secondary | ICD-10-CM

## 2018-04-22 DIAGNOSIS — N183 Chronic kidney disease, stage 3 (moderate): Secondary | ICD-10-CM

## 2018-04-22 DIAGNOSIS — N390 Urinary tract infection, site not specified: Secondary | ICD-10-CM

## 2018-04-22 LAB — CBC WITH DIFFERENTIAL/PLATELET
Abs Immature Granulocytes: 0.11 10*3/uL — ABNORMAL HIGH (ref 0.00–0.07)
BASOS ABS: 0 10*3/uL (ref 0.0–0.1)
Basophils Relative: 1 %
Eosinophils Absolute: 0.1 10*3/uL (ref 0.0–0.5)
Eosinophils Relative: 2 %
HCT: 27.5 % — ABNORMAL LOW (ref 39.0–52.0)
Hemoglobin: 8.6 g/dL — ABNORMAL LOW (ref 13.0–17.0)
Immature Granulocytes: 2 %
LYMPHS ABS: 0.6 10*3/uL — AB (ref 0.7–4.0)
Lymphocytes Relative: 9 %
MCH: 28 pg (ref 26.0–34.0)
MCHC: 31.3 g/dL (ref 30.0–36.0)
MCV: 89.6 fL (ref 80.0–100.0)
Monocytes Absolute: 0.9 10*3/uL (ref 0.1–1.0)
Monocytes Relative: 13 %
NRBC: 0 % (ref 0.0–0.2)
Neutro Abs: 5.1 10*3/uL (ref 1.7–7.7)
Neutrophils Relative %: 73 %
Platelets: 265 10*3/uL (ref 150–400)
RBC: 3.07 MIL/uL — ABNORMAL LOW (ref 4.22–5.81)
RDW: 15.7 % — ABNORMAL HIGH (ref 11.5–15.5)
WBC: 6.9 10*3/uL (ref 4.0–10.5)

## 2018-04-22 LAB — BASIC METABOLIC PANEL
Anion gap: 5 (ref 5–15)
BUN: 15 mg/dL (ref 8–23)
CO2: 24 mmol/L (ref 22–32)
Calcium: 7.9 mg/dL — ABNORMAL LOW (ref 8.9–10.3)
Chloride: 109 mmol/L (ref 98–111)
Creatinine, Ser: 1.52 mg/dL — ABNORMAL HIGH (ref 0.61–1.24)
GFR, EST AFRICAN AMERICAN: 50 mL/min — AB (ref >60)
GFR, EST NON AFRICAN AMERICAN: 43 mL/min — AB (ref >60)
Glucose, Bld: 148 mg/dL — ABNORMAL HIGH (ref 70–99)
Potassium: 4.4 mmol/L (ref 3.5–5.1)
Sodium: 138 mmol/L (ref 135–145)

## 2018-04-22 LAB — GLUCOSE, CAPILLARY
Glucose-Capillary: 115 mg/dL — ABNORMAL HIGH (ref 70–99)
Glucose-Capillary: 121 mg/dL — ABNORMAL HIGH (ref 70–99)
Glucose-Capillary: 90 mg/dL (ref 70–99)
Glucose-Capillary: 152 mg/dL — ABNORMAL HIGH (ref 70–99)
Glucose-Capillary: 109 mg/dL — ABNORMAL HIGH (ref 70–99)
Glucose-Capillary: 104 mg/dL — ABNORMAL HIGH (ref 70–99)

## 2018-04-22 LAB — PHOSPHORUS: Phosphorus: 3.6 mg/dL (ref 2.5–4.6)

## 2018-04-22 LAB — MAGNESIUM: Magnesium: 2.1 mg/dL (ref 1.7–2.4)

## 2018-04-22 MED ORDER — FREE WATER
200.0000 mL | Freq: Three times a day (TID) | Status: DC
  Administered 2018-04-22 – 2018-04-29 (×22): 200 mL

## 2018-04-22 MED ORDER — OXYCODONE HCL 5 MG/5ML PO SOLN
5.0000 mg | Freq: Four times a day (QID) | ORAL | Status: DC | PRN
  Administered 2018-04-22 – 2018-04-29 (×7): 5 mg
  Filled 2018-04-22 (×8): qty 5

## 2018-04-22 MED ORDER — OSMOLITE 1.5 CAL PO LIQD
360.0000 mL | Freq: Four times a day (QID) | ORAL | Status: DC
  Administered 2018-04-22 – 2018-04-24 (×9): 360 mL
  Filled 2018-04-22 (×12): qty 474

## 2018-04-22 MED ORDER — OSMOLITE 1.2 CAL PO LIQD
1000.0000 mL | ORAL | Status: DC
  Administered 2018-04-22: 1000 mL
  Filled 2018-04-22: qty 1000

## 2018-04-22 NOTE — Progress Notes (Signed)
[place] performed with no complications

## 2018-04-22 NOTE — Progress Notes (Signed)
Called to pt's bedside by RN.  Pt pulled out his trach AGAIN.  Trach reinserted and resecured by RT.  Dressing changed and pt placed back on Trach Collar.  RN has contacted physician for an order for restraints.  Pt left resting comfortably with no resp distress noted.  Will cont to monitor.

## 2018-04-22 NOTE — Progress Notes (Signed)
Progress Note: General Surgery Service   Assessment/Plan: Active Problems:   Tracheostomy in place [facility])   Cancer of lower lip   Protein-calorie malnutrition, severe  s/p Procedure(s): LAPAROSCOPIC GASTROSTOMY WITH G-TUBE PLACEMENT 04/21/2018 -Advance tube feeds to goal (22ml/h), can also switch to bolus feeds if preferable (360 x 4 times a day) -2 t bars in place plus balloon -f/u with me in 6 weeks   LOS: 10 days  Chief Complaint/Subjective: No issues overnight  Objective: Vital signs in last 24 hours: Temp:  [97.8 F (36.6 C)-98.4 F (36.9 C)] 97.9 F (36.6 C) (02/08 0809) Pulse Rate:  [61-69] 63 (02/08 0809) Resp:  [6-19] 16 (02/08 0809) BP: (90-124)/(46-77) 103/47 (02/08 0809) SpO2:  [98 %-100 %] 100 % (02/08 0809) FiO2 (%):  [28 %] 28 % (02/08 0809) Weight:  [69.3 kg] 69.3 kg (02/08 0314) Last BM Date: 04/20/18  Intake/Output from previous day: 02/07 0701 - 02/08 0700 In: 970 [I.V.:720; IV Piggyback:250] Out: 8119 [Urine:1700; Blood:25] Intake/Output this shift: No intake/output data recorded.  Lungs: nonlabored  Cardiovascular: RRR  Abd: soft, NT, ND, incision c/d/i, g tube in place, 1 t bar seems to have fallen off  Extremities: no edema  Neuro: somnolent but arousable  Lab Results: CBC  Recent Labs    04/21/18 0311 04/22/18 0251  WBC 8.9 6.9  HGB 8.4* 8.6*  HCT 26.7* 27.5*  PLT 286 265   BMET Recent Labs    04/21/18 0311 04/22/18 0251  NA 137 138  K 4.8 4.4  CL 108 109  CO2 20* 24  GLUCOSE 129* 148*  BUN 15 15  CREATININE 1.46* 1.52*  CALCIUM 8.2* 7.9*   PT/INR No results for input(s): LABPROT, INR in the last 72 hours. ABG No results for input(s): PHART, HCO3 in the last 72 hours.  Invalid input(s): PCO2, PO2  Studies/Results:  Anti-infectives: Anti-infectives (From admission, onward)   Start     Dose/Rate Route Frequency Ordered Stop   04/20/18 2200  sulfamethoxazole-trimethoprim (BACTRIM) 160 mg in dextrose 5 % 250 mL  IVPB     160 mg 260 mL/hr over 60 Minutes Intravenous Every 12 hours 04/20/18 1217     04/20/18 1000  ciprofloxacin (CIPRO) IVPB 400 mg     400 mg 200 mL/hr over 60 Minutes Intravenous To Surgery 04/19/18 1948 04/20/18 1512   04/18/18 2200  sulfamethoxazole-trimethoprim (BACTRIM) 363.04 mg of trimethoprim in dextrose 5 % 500 mL IVPB  Status:  Discontinued     10 mg/kg/day of trimethoprim  72.6 kg 348.5 mL/hr over 90 Minutes Intravenous Every 12 hours 04/18/18 1711 04/20/18 1217   04/17/18 2200  sulfamethoxazole-trimethoprim (BACTRIM,SEPTRA) 200-40 MG/5ML suspension 20 mL  Status:  Discontinued     20 mL Per Tube Every 12 hours 04/17/18 1844 04/18/18 1711   04/12/18 1000  ciprofloxacin (CIPRO) IVPB 400 mg     400 mg 200 mL/hr over 60 Minutes Intravenous On call to O.R. 04/12/18 0844 04/12/18 1039      Medications: Scheduled Meds: . bacitracin   Topical BID  . chlorhexidine  15 mL Mouth Rinse BID  . mouth rinse  15 mL Mouth Rinse q12n4p  . pantoprazole (PROTONIX) IV  40 mg Intravenous Q12H  . white petrolatum   Topical BID   Continuous Infusions: . dextrose 5 % and 0.45 % NaCl with KCl 20 mEq/L 125 mL/hr at 04/22/18 0447  . feeding supplement (OSMOLITE 1.2 CAL) 20 mL/hr at 04/22/18 0600  . lactated ringers Stopped (04/20/18 1701)  .  lactated ringers 50 mL/hr at 04/21/18 1309  . sulfamethoxazole-trimethoprim 160 mg (04/22/18 0215)   PRN Meds:.morphine injection, ondansetron (ZOFRAN) IV  [name], MD [facility]

## 2018-04-22 NOTE — Progress Notes (Signed)
PROGRESS NOTE  DANIE DIEHL DUK:[id number] DOB: 1940/06/12 DOA: 04/12/2018 PCP: Rosita Fire, MD  Brief History   Pt. with PMH of CKD stage III, hydronephrosis status post suprapubic Foley catheter, COPD, dementia, who was admitted under ENT service for new diagnosis of lower lip squamous cell carcinoma status post total lower lip resection, bilateral neck dissection, and tracheostomy by ENT/plastic surgery,will patient recently diagnosed with a scalp malignancy,OG significant for squamous cell carcinoma, with some lymph node involvement, patient was on NG tube feed, unfortunately his NGT was dislodged overnight, surgery consulted regarding PEG tube, patient was noted to have NSVT, had low-grade temperature, had positive urine analysis, started on Bactrim, we were consulted for medical management.   A & P   NSVT: No recurrence of 2/4/20202 12 beat of NSVT on telemetry. Patient was asymptomatic. Echo performed in 09/2017 demonstrated a preserved EF and no regional wall motion abnormalities. Will monitor electrolytes and replace as necessary. Continue telemetry. Rate controlling meds held due to low blood pressures. Monitor.  Squamous cell carcinoma of lower lip: S/P tracheostomy. Management per primary ENT team, status post lower lip resection, tracheostomy, with bilateral radical neck dissection, with lower lip reconstruction with tissue rearrangement with regional flap done by ENT and plastic surgery.Management per primary team.  Scalp Malignancy: With large growth in the scalp area, it is malignant as discussed with ENT, Surgery on 04/20/2018.   Lung nodule 6 mm left lower lobe: CT of the chest was performed, repeat CT scan of the chest recommended in 6 to 12 months interval.  Probably between July 2020-December 2020.  Low-grade fever: Resolved following replacement of suprapubic foley. Bactrim started by primary service. CT chest reveals no source of infection.  Protein calorie  malnutrition, severe: PEG tube placed by general surgery today. Continue tube feeds per their recommendation. Patient is NPO.  CKD stage III: Function is actually better.  Will monitor.  Hypotension: Will give small bolus. Blood pressures are a little better.  Diet: npo, tube feeds once PEG in. DVT Prophylaxis: mechanical compression device Family Communication: Betsey Amen is legal guardian. [id number] CODE STATUS: Full Code Disposition: tbd   Matsue Strom, DO [facility] Direct contact: see [email or web address]  7PM-7AM contact night coverage as above 04/22/2018, 3:04 PM  LOS: 10 days    Interval History/Subjective  The patient is sleeping soundly and is not awakened. No acute distress.  Objective   Vitals:  Vitals:   04/22/18 1153 04/22/18 1500  BP: (!) 101/44   Pulse: 74 79  Resp: 13   Temp: 98.3 F (36.8 C)   SpO2: 100%     Exam:  Constitutional:  . Pt is confused. He is asking what he did wrong that we are keeping him here. Otherwise the patient appears comfortably.  Respiratory:  . No wheezes, rales, or rhonchi.  . No increased work of breathing. . No tactile fremitus. Cardiovascular:  . Regular rate and rhythm. No murmurs, ectopy, or gallups.  . No LE extremity edema   . Normal pedal pulses Abdomen:  . Abdomen is soft, non-tender, non-distended. . No hernias, organomegaly, or masses are appreciated. . Normoactive bowel sounds.  Musculoskeletal:  . Digits/nails BUE: no clubbing, cyanosis, petechiae, infection . exam of joints, bones, muscles of at least one of following: head/neck, RUE, LUE, RLE, LLE   o strength and tone normal, no atrophy, no abnormal movements o No tenderness, masses o Normal ROM, no contractures  Skin:  . No rashes, lesions, ulcers . palpation of skin:  no induration or nodules Neurologic:  Unable to evaluate due to the patient's inability to cooperate with exam. Psychiatric:  Unable to evaluate due to the patient's  inability to cooperate with exam.  I have seen and examined this patient myself. I have spent 35 minutes in his evaluation and care.  I have personally reviewed the following:   Today's Data  . Laboratory data and vitals.   Scheduled Meds: . bacitracin   Topical BID  . chlorhexidine  15 mL Mouth Rinse BID  . feeding supplement (OSMOLITE 1.5 CAL)  360 mL Per Tube QID  . free water  200 mL Per Tube Q8H  . mouth rinse  15 mL Mouth Rinse q12n4p  . pantoprazole (PROTONIX) IV  40 mg Intravenous Q12H  . white petrolatum   Topical BID   Continuous Infusions: . dextrose 5 % and 0.45 % NaCl with KCl 20 mEq/L 10 mL/hr at 04/22/18 1015  . lactated ringers Stopped (04/20/18 1701)  . lactated ringers 50 mL/hr at 04/21/18 1309  . sulfamethoxazole-trimethoprim 160 mg (04/22/18 1014)    Active Problems:   Tracheostomy in place [facility])   Cancer of lower lip   Protein-calorie malnutrition, severe   LOS: 10 days

## 2018-04-22 NOTE — Progress Notes (Signed)
Physical Therapy Treatment Patient Details Name: Anthony [name] MRN: [id number] DOB: 03/25/1940 Today's Date: 04/22/2018    History of Present Illness 78 yo male admitted for management of squamous cell carcinoma of lip; now s/p total lower lip resection, tracheostomy, Reconstruction of lower lip defect, bilateral radical neck dissection due to enlarged nodes in both superior necks;  has a pertinent past medical history of Acute pyelonephritis (12/23/2014), Arthritis, Asthma, BPH (benign prostatic hyperplasia), Cancer ([place]), Chronic back pain, Chronic hip pain, CKD (chronic kidney disease), COPD (chronic obstructive pulmonary disease) ([place]), Dementia ([place]), Depression, Dysphagia, GERD (gastroesophageal reflux disease), Headache, History of kidney stones, Hypertension, Kidney stone, Neuropathy, Pneumonia, PTSD (post-traumatic stress disorder), Pulmonary nodules (12/25/2014), and Restless leg syndrome.    PT Comments    Session focused on bed level therex and sitting EOB, sitting balance. Patient with decreased assistance needed today and states this is first time up in quite a while. Refused standing but agreed to attempt next PT session.     Follow Up Recommendations  SNF     Equipment Recommendations  None recommended by PT    Recommendations for Other Services       Precautions / Restrictions Precautions Precautions: Fall;Other (comment) Precaution Comments: Trach, NG tube Restrictions Weight Bearing Restrictions: No    Mobility  Bed Mobility Overal bed mobility: Needs Assistance Bed Mobility: Sidelying to Sit   Sidelying to sit: HOB elevated;Min guard   Sit to supine: Min assist;Min guard   General bed mobility comments: min guard to come to sitting,   Transfers                 General transfer comment: pt declining standing up today request we do that next time  Ambulation/Gait                 Stairs             Wheelchair Mobility     Modified Rankin (Stroke Patients Only)       Balance Overall balance assessment: Needs assistance Sitting-balance support: Feet supported Sitting balance-Leahy Scale: Fair       Standing balance-Leahy Scale: Poor                              Cognition Arousal/Alertness: Awake/alert Behavior During Therapy: WFL for tasks assessed/performed Overall Cognitive Status: No family/caregiver present to determine baseline cognitive functioning                                 General Comments: pt engaged conversationally, with comedic input;pt appeared motivated to participate during session       Exercises      General Comments        Pertinent Vitals/Pain Pain Assessment: No/denies pain    Home Living                      Prior Function            PT Goals (current goals can now be found in the care plan section) Acute Rehab PT Goals Patient Stated Goal: to move PT Goal Formulation: Patient unable to participate in goal setting Time For Goal Achievement: 04/30/18 Potential to Achieve Goals: Fair Progress towards PT goals: Progressing toward goals    Frequency    Min 2X/week      PT Plan Current plan remains appropriate  Co-evaluation              AM-PAC PT "6 Clicks" Mobility   Outcome Measure  Help needed turning from your back to your side while in a flat bed without using bedrails?: A Little Help needed moving from lying on your back to sitting on the side of a flat bed without using bedrails?: A Lot Help needed moving to and from a bed to a chair (including a wheelchair)?: A Lot Help needed standing up from a chair using your arms (e.g., wheelchair or bedside chair)?: A Lot Help needed to walk in hospital room?: A Lot Help needed climbing 3-5 steps with a railing? : Total 6 Click Score: 12    End of Session Equipment Utilized During Treatment: Oxygen Activity Tolerance: Patient limited by  fatigue Patient left: in bed;with bed alarm set;with restraints reapplied Nurse Communication: Mobility status PT Visit Diagnosis: Muscle weakness (generalized) (M62.81);Other abnormalities of gait and mobility (R26.89)     Time: 1400-1420 PT Time Calculation (min) (ACUTE ONLY): 20 min  Charges:  $Therapeutic Activity: 8-22 mins                     Reinaldo Berber, PT, DPT Acute Rehabilitation Services Pager: [phone] Office: [phone]     Reinaldo Berber 04/22/2018, 2:41 PM

## 2018-04-22 NOTE — Progress Notes (Signed)
Nutrition Follow-up  DOCUMENTATION CODES:  Severe malnutrition in context of chronic illness  INTERVENTION:  To liberalize from pump, change to bolus feeds:   360 cc (approximately 1.5 cans) of Osmolite 1.5 QID. Flush w/ 60 cc before/after each bolus  TF provides 2160 kcals, 90g Pro, 1577 cc free water.   +Add additional 200 cc free water flush q8 to meet remainder of fluid needs  NUTRITION DIAGNOSIS:  Severe Malnutrition related to chronic illness(lip cancer) as evidenced by energy intake < or equal to 75% for > or equal to 1 month, severe fat depletion, severe muscle depletion.  Ongoing, resolving with adequate nutritiom  GOAL:  Patient will meet greater than or equal to 90% of their needs   Met w/ TF  MONITOR:  TF tolerance, Labs, Skin, Weight trends, I & O's  REASON FOR ASSESSMENT:  Consult Enteral/tube feeding initiation and management  ASSESSMENT:  78 yo male with cancer of external lower lip that has been affecting ability to eat admitted for surgery.  Biopsy confirms squamous cell cancer with imaging confirming enlarged nodes bilaterally. Pt also with sizeable tumor on the right vertex of scalp that has been removed twice previously. PMH includes COPD, CKD, dementia, dysphagia  RD consulted to restart TF s/p Gastrostomy placement 2/8. On RD arrival, TF infusing-Osmolite 1.2 @ 20cc/hr- RN reports just increasing to 60 cc about an hour ago.   When asked how pt is feeling, he replies "Like shit". However, he attributes this to having generalized pain all over his body. He denies any issues related to his PEG or tube feedings.    Refeeding labs all WDL.   Will change continuous feeds to bolus in anticipation of D/C  Labs: K: 4.6, Phos:3.6, mag:2.1, BUN/Creat 15/1.52 (stable). Bg: 90-150, H/H:8.6/27.5 Meds: PPI, ivf, Osmolite 1.2, PRN morphine  Recent Labs  Lab 04/18/18 1924  04/20/18 0301 04/21/18 0311 04/22/18 0251  NA  --    < > 135 137 138  K  --    < > 4.6  4.8 4.4  CL  --    < > 107 108 109  CO2  --    < > 20* 20* 24  BUN  --    < > '23 15 15  ' CREATININE  --    < > 1.42* 1.46* 1.52*  CALCIUM  --    < > 8.3* 8.2* 7.9*  MG 2.2  --  2.1  --  2.1  PHOS 3.6  --   --   --  3.6  GLUCOSE  --    < > 118* 129* 148*   < > = values in this interval not displayed.    Diet Order:   Diet Order            Diet NPO time specified  Diet effective now             EDUCATION NEEDS:  Not appropriate for education at this time  Skin:   Surgical incisions to neck, lip, head and abdomen  Last BM:  2/6  Height:  Ht Readings from Last 1 Encounters:  04/20/18 '6\' 3"'  (1.905 m)   Weight:  Wt Readings from Last 1 Encounters:  04/22/18 69 kg   Wt Readings from Last 10 Encounters:  04/22/18 69 kg  11/10/17 81.8 kg  10/29/17 72 kg  10/23/17 72.6 kg  10/20/17 86 kg  10/11/17 86.9 kg  09/27/17 83.9 kg  09/21/17 83.9 kg  09/09/17 83.9 kg  08/05/17 83.9  kg   Ideal Body Weight:  89.1 kg  BMI:  Body mass index is 19.01 kg/m.  Estimated Nutritional Needs:  Kcal:  2050-2300 (30-33 kcal/kg bw) Protein:  90-105g Pro (1.3-1.5g/kg bw) Fluid:  1.7- 2.1 L fluid (25-30 ml/kg bw)  Burtis Junes RD, LDN, CNSC Clinical Nutrition Available Tues-Sat via Pager: [phone] 04/22/2018 1:27 PM

## 2018-04-23 LAB — GLUCOSE, CAPILLARY
GLUCOSE-CAPILLARY: 117 mg/dL — AB (ref 70–99)
Glucose-Capillary: 122 mg/dL — ABNORMAL HIGH (ref 70–99)
Glucose-Capillary: 140 mg/dL — ABNORMAL HIGH (ref 70–99)
Glucose-Capillary: 80 mg/dL (ref 70–99)
Glucose-Capillary: 84 mg/dL (ref 70–99)
Glucose-Capillary: 90 mg/dL (ref 70–99)

## 2018-04-23 NOTE — Progress Notes (Signed)
PROGRESS NOTE  MERRELL BORSUK GLO:[id number] DOB: [birth year] DOA: 04/12/2018 PCP: Rosita Fire, MD  Brief History   Pt. with PMH of CKD stage III, hydronephrosis status post suprapubic Foley catheter, COPD, dementia, who was admitted under ENT service for new diagnosis of lower lip squamous cell carcinoma status post total lower lip resection, bilateral neck dissection, and tracheostomy by ENT/plastic surgery,will patient recently diagnosed with a scalp malignancy,OG significant for squamous cell carcinoma, with some lymph node involvement, patient was on NG tube feed, unfortunately his NGT was dislodged overnight, surgery consulted regarding PEG tube, patient was noted to have NSVT, had low-grade temperature, had positive urine analysis, started on Bactrim, we were consulted for medical management.   A & P   NSVT: No recurrence of 2/4/20202 12 beat of NSVT on telemetry. Patient was asymptomatic. Echo performed in 09/2017 demonstrated a preserved EF and no regional wall motion abnormalities. Will monitor electrolytes and replace as necessary. Continue telemetry. Rate controlling meds held due to low blood pressures. Monitor.  Squamous cell carcinoma of lower lip: S/P tracheostomy. Management per primary ENT team, status post lower lip resection, tracheostomy, with bilateral radical neck dissection, with lower lip reconstruction with tissue rearrangement with regional flap done by ENT and plastic surgery.Management per primary team.  Scalp Malignancy: With large growth in the scalp area, it is malignant as discussed with ENT, Surgery on 04/20/2018.   Encephalopathy: The patient remains agitated and has pulled out his trach overnight. I have ordered soft restraints for the patient's safety.  Lung nodule 6 mm left lower lobe: CT of the chest was performed, repeat CT scan of the chest recommended in 6 to 12 months interval.  Probably between July 2020-December 2020.  Low-grade fever: Resolved  following replacement of suprapubic foley. Bactrim started by primary service. CT chest reveals no source of infection.  Protein calorie malnutrition, severe: PEG tube placed by general surgery today. Continue tube feeds per their recommendation. Patient is NPO.  CKD stage III: Function is actually better.  Will monitor.  Hypotension: Will give small bolus. Blood pressures are a little better.  Diet: npo, tube feeds once PEG in. DVT Prophylaxis: mechanical compression device Family Communication: Betsey Amen is legal guardian. [id number] CODE STATUS: Full Code Disposition: tbd   Sheryl Saintil, DO [facility] Direct contact: see [email or web address]  7PM-7AM contact night coverage as above 04/23/2018, 1:05 PM  LOS: 11 days    Interval History/Subjective  The patient is awake, but somnolent. No acute distress.  Objective   Vitals:  Vitals:   04/23/18 1152 04/23/18 1256  BP:  (!) 101/45  Pulse: 77 67  Resp: (!) 23 15  Temp:    SpO2: 100% 96%    Exam:  Constitutional:  Pt is awake, but somnolent. He is a little agitated and is confused. No acute distress.  Respiratory:  . No wheezes, rales, or rhonchi.  . No increased work of breathing. . No tactile fremitus. Cardiovascular:  . Regular rate and rhythm. No murmurs, ectopy, or gallups.  . No LE extremity edema   . Normal pedal pulses Abdomen:  . Abdomen is soft, non-tender, non-distended. . No hernias, organomegaly, or masses are appreciated. . Normoactive bowel sounds.  Musculoskeletal:  . Digits/nails BUE: no clubbing, cyanosis, petechiae, infection . exam of joints, bones, muscles of at least one of following: head/neck, RUE, LUE, RLE, LLE   o strength and tone normal, no atrophy, no abnormal movements o No tenderness, masses o Normal ROM, no contractures  Skin:  . No rashes, lesions, ulcers . palpation of skin: no induration or nodules Neurologic:  Unable to evaluate due to the patient's inability  to cooperate with exam. Psychiatric:  Unable to evaluate due to the patient's inability to cooperate with exam.  I have seen and examined this patient myself. I have spent 30 minutes in his evaluation and care.  I have personally reviewed the following:   Today's Data  . Laboratory data and vitals.   Scheduled Meds: . bacitracin   Topical BID  . chlorhexidine  15 mL Mouth Rinse BID  . feeding supplement (OSMOLITE 1.5 CAL)  360 mL Per Tube QID  . free water  200 mL Per Tube Q8H  . mouth rinse  15 mL Mouth Rinse q12n4p  . pantoprazole (PROTONIX) IV  40 mg Intravenous Q12H  . white petrolatum   Topical BID   Continuous Infusions: . lactated ringers 50 mL/hr at 04/23/18 0659  . sulfamethoxazole-trimethoprim 160 mg (04/23/18 0948)    Active Problems:   Tracheostomy in place [facility])   Cancer of lower lip   Protein-calorie malnutrition, severe   LOS: 11 days

## 2018-04-23 NOTE — Progress Notes (Signed)
2 Days Post-Op   Subjective/Chief Complaint: He is sore in PEG area   Objective: Vital signs in last 24 hours: Temp:  [97.6 F (36.4 C)-99.8 F (37.7 C)] 97.6 F (36.4 C) (02/09 0848) Pulse Rate:  [61-88] 64 (02/09 0848) Resp:  [13-19] 15 (02/09 0848) BP: (101-117)/(40-81) 103/40 (02/09 0848) SpO2:  [97 %-100 %] 98 % (02/09 0848) FiO2 (%):  [28 %] 28 % (02/09 0834) Weight:  [69 kg] 69 kg (02/08 1323) Last BM Date: 04/23/18  Intake/Output from previous day: 02/08 0701 - 02/09 0700 In: 2937.2 [I.V.:671.4; NG/GT:1680; IV Piggyback:520.8] Out: 3100 [Urine:3100] Intake/Output this shift: No intake/output data recorded.  lip in pulling free with flap with wound dehiscence dressing intact on scalp.   Lab Results:  Recent Labs    04/21/18 0311 04/22/18 0251  WBC 8.9 6.9  HGB 8.4* 8.6*  HCT 26.7* 27.5*  PLT 286 265   BMET Recent Labs    04/21/18 0311 04/22/18 0251  NA 137 138  K 4.8 4.4  CL 108 109  CO2 20* 24  GLUCOSE 129* 148*  BUN 15 15  CREATININE 1.46* 1.52*  CALCIUM 8.2* 7.9*   PT/INR No results for input(s): LABPROT, INR in the last 72 hours. ABG No results for input(s): PHART, HCO3 in the last 72 hours.  Invalid input(s): PCO2, PO2  Studies/Results: No results found.  Anti-infectives: Anti-infectives (From admission, onward)   Start     Dose/Rate Route Frequency Ordered Stop   04/20/18 2200  sulfamethoxazole-trimethoprim (BACTRIM) 160 mg in dextrose 5 % 250 mL IVPB     160 mg 260 mL/hr over 60 Minutes Intravenous Every 12 hours 04/20/18 1217     04/20/18 1000  ciprofloxacin (CIPRO) IVPB 400 mg     400 mg 200 mL/hr over 60 Minutes Intravenous To Surgery 04/19/18 1948 04/20/18 1512   04/18/18 2200  sulfamethoxazole-trimethoprim (BACTRIM) 363.04 mg of trimethoprim in dextrose 5 % 500 mL IVPB  Status:  Discontinued     10 mg/kg/day of trimethoprim  72.6 kg 348.5 mL/hr over 90 Minutes Intravenous Every 12 hours 04/18/18 1711 04/20/18 1217   04/17/18 2200  sulfamethoxazole-trimethoprim (BACTRIM,SEPTRA) 200-40 MG/5ML suspension 20 mL  Status:  Discontinued     20 mL Per Tube Every 12 hours 04/17/18 1844 04/18/18 1711   04/12/18 1000  ciprofloxacin (CIPRO) IVPB 400 mg     400 mg 200 mL/hr over 60 Minutes Intravenous On call to O.R. 04/12/18 0844 04/12/18 1039      Assessment/Plan: s/p Procedure(s): LAPAROSCOPIC GASTROSTOMY WITH G-TUBE PLACEMENT (N/A) continue care for patient per medicine. wounds per Dr Marla Roe  LOS: 11 days    Melissa Montane 04/23/2018

## 2018-04-24 LAB — GLUCOSE, CAPILLARY
GLUCOSE-CAPILLARY: 120 mg/dL — AB (ref 70–99)
Glucose-Capillary: 144 mg/dL — ABNORMAL HIGH (ref 70–99)
Glucose-Capillary: 92 mg/dL (ref 70–99)
Glucose-Capillary: 165 mg/dL — ABNORMAL HIGH (ref 70–99)
Glucose-Capillary: 157 mg/dL — ABNORMAL HIGH (ref 70–99)
Glucose-Capillary: 90 mg/dL (ref 70–99)

## 2018-04-24 MED ORDER — INSULIN ASPART 100 UNIT/ML ~~LOC~~ SOLN: 0.0000 [IU] | Freq: Every day | SUBCUTANEOUS | Status: DC

## 2018-04-24 MED ORDER — INSULIN ASPART 100 UNIT/ML ~~LOC~~ SOLN
0.0000 [IU] | Freq: Three times a day (TID) | SUBCUTANEOUS | Status: DC
  Administered 2018-04-24 – 2018-04-25 (×2): 3 [IU] via SUBCUTANEOUS
  Administered 2018-04-25 – 2018-04-29 (×6): 2 [IU] via SUBCUTANEOUS

## 2018-04-24 MED ORDER — OSMOLITE 1.2 CAL PO LIQD
1000.0000 mL | ORAL | Status: DC
  Administered 2018-04-24 – 2018-04-29 (×6): 1000 mL
  Filled 2018-04-24 (×10): qty 1000

## 2018-04-24 NOTE — Progress Notes (Signed)
  Speech Language Pathology Treatment: Nada Boozer Speaking valve  Patient Details Name: Anthony [name] MRN: [id number] DOB: [birth year] Today's Date: 04/24/2018 Time: 0920-0943 SLP Time Calculation (min) (ACUTE ONLY): 23 min  Assessment / Plan / Recommendation Clinical Impression  Pt demonstrates excellent tolerance of PMSV though staff has not been using it often given that pt can phonate around his cuffless trach without PMSV in place. Intelligibility is much improved with valve however and left it in plan view for staff to use more often. Pt about 60% intelligible at word/phrase level, needs revisions and repetition for bilabial phonemes. Introduced word substitutions, over articulation, increased volume as beneficial strategies with demonstration. Pt return demonstrated over articulation and increased volume with min verbal cues at word level. Will continue efforts though will not advance PMSV use given that pt is typically sleeping when alone and PMSV use while sleeping is not recommended. He will also have to remain restrained given that he has pulled out his trach several times. Will follow for needs 1 x a week as pt is relatively stable and will need f/u after d/c   HPI HPI:  The patient is a 78 year old male who has had a sizable tumor grow on the lower lip over the last several months that is biopsy-proven squamous cell carcinoma.  CT imaging demonstrates enlarged lymph nodes in both upper necks. Pt underwent Total lower lip resection, bilateral selective neck dissections and tracheostomy on 1/29. Plastic surgery reconstructed lower lip after resection. Pt to continue alternative method of nutrition until cleared by plastic.       SLP Plan  Continue with current plan of care       Recommendations                   Follow up Recommendations: Inpatient Rehab SLP Visit Diagnosis: Dysarthria and anarthria (R47.1);Dysphagia, unspecified (R13.10) Plan: Continue with current plan of  care       GO               Anthony Baltimore, MA [place] Pager [phone] Office [phone]  Lynann Beaver 04/24/2018, 9:50 AM

## 2018-04-24 NOTE — Progress Notes (Signed)
PROGRESS NOTE  Anthony [name] HQI:[id number] DOB: November 03, 1940 DOA: 04/12/2018 PCP: Rosita Fire, MD  Brief History   Pt. with PMH of CKD stage III, hydronephrosis status post suprapubic Foley catheter, COPD, dementia, who was admitted under ENT service for new diagnosis of lower lip squamous cell carcinoma status post total lower lip resection, bilateral neck dissection, and tracheostomy by ENT/plastic surgery,will patient recently diagnosed with a scalp malignancy,OG significant for squamous cell carcinoma, with some lymph node involvement, patient was on NG tube feed, unfortunately his NGT was dislodged overnight, surgery consulted regarding PEG tube, patient was noted to have NSVT, had low-grade temperature, had positive urine analysis, started on Bactrim, we were consulted for medical management.   A & P   NSVT: No recurrence of 2/4/20202 12 beat of NSVT on telemetry. Patient was asymptomatic. Echo performed in 09/2017 demonstrated a preserved EF and no regional wall motion abnormalities. Will monitor electrolytes and replace as necessary. Continue telemetry. Rate controlling meds held due to low blood pressures. Monitor.  Squamous cell carcinoma of lower lip: S/P tracheostomy. Management per primary ENT team, status post lower lip resection, tracheostomy, with bilateral radical neck dissection, with lower lip reconstruction with tissue rearrangement with regional flap done by ENT and plastic surgery.Management per primary team.  Scalp Malignancy: With large growth in the scalp area, it is malignant as discussed with ENT, Surgery on 04/20/2018.   Encephalopathy: The patient seems to be in much better frame of mind today. Improved.  Lung nodule 6 mm left lower lobe: CT of the chest was performed, repeat CT scan of the chest recommended in 6 to 12 months interval.  Probably between July 2020-December 2020.  Low-grade fever: Resolved following replacement of suprapubic foley. Bactrim started  by primary service. CT chest reveals no source of infection.  Protein calorie malnutrition, severe: PEG tube placed by general surgery today. Continue tube feeds per their recommendation. Patient is NPO. His tube feeds were made bolus over the weekend, but the patient isn't able to tolerate them. He has been returned to continuous feeds.  CKD stage III: Function is actually better.  Will monitor.  Hypotension: Will give small bolus. Blood pressures are a little better.  Diet: npo, continuous tube feeds. DVT Prophylaxis: mechanical compression device Family Communication: Betsey Amen is legal guardian. [phone] CODE STATUS: Full Code Disposition: tbd   Nyasha Rahilly, DO [facility] Direct contact: see [email or web address]  7PM-7AM contact night coverage as above 04/24/2018, 5:07 PM  LOS: 12 days    Interval History/Subjective  The patient is awake, and alert. No new complaints.  Objective   Vitals:  Vitals:   04/24/18 1139 04/24/18 1147  BP: (!) 91/55   Pulse: 72 77  Resp: 14 15  Temp: 98.5 F (36.9 C)   SpO2: 99% 100%    Exam:  Constitutional:  Pt is awake, alert, and in a good mood. No acute distress. Respiratory:  . No wheezes, rales, or rhonchi.  . No increased work of breathing. . No tactile fremitus. Cardiovascular:  . Regular rate and rhythm. No murmurs, ectopy, or gallups.  . No LE extremity edema   . Normal pedal pulses Abdomen:  . Abdomen is soft, non-tender, non-distended. . No hernias, organomegaly, or masses are appreciated. . Normoactive bowel sounds.  Musculoskeletal:  . Digits/nails BUE: no clubbing, cyanosis, petechiae, infection . exam of joints, bones, muscles of at least one of following: head/neck, RUE, LUE, RLE, LLE   o strength and tone normal, no atrophy, no  abnormal movements o No tenderness, masses o Normal ROM, no contractures  Skin:  . No rashes, lesions, ulcers . palpation of skin: no induration or nodules Neurologic:   Moving all extremities. Psychiatric:  Appropriate mood and affect.  I have seen and examined this patient myself. I have spent 30 minutes in his evaluation and care.  I have personally reviewed the following:   Today's Data  . Laboratory data and vitals.   Scheduled Meds: . bacitracin   Topical BID  . chlorhexidine  15 mL Mouth Rinse BID  . free water  200 mL Per Tube Q8H  . insulin aspart  0-15 Units Subcutaneous TID WC  . insulin aspart  0-5 Units Subcutaneous QHS  . mouth rinse  15 mL Mouth Rinse q12n4p  . pantoprazole (PROTONIX) IV  40 mg Intravenous Q12H  . white petrolatum   Topical BID   Continuous Infusions: . feeding supplement (OSMOLITE 1.2 CAL)    . lactated ringers 10 mL/hr at 04/24/18 1215  . sulfamethoxazole-trimethoprim 160 mg (04/24/18 1005)    Active Problems:   Tracheostomy in place [facility])   Cancer of lower lip   Protein-calorie malnutrition, severe   LOS: 12 days

## 2018-04-24 NOTE — Progress Notes (Signed)
Subjective: Very pleasant 78 year old male pt with past med hx of CKD, COPD and dementia.  Pt had G tube placed on Friday by Dr. Kieth [name].  On Thursday pt had squamous cell removed from his scalp by Dr. Redmond [name].  Dr. Marla [name] performed re-advancement of the lower lip flap.  Today he says he had a good weekend.  He denies pain.  He says he has been getting up in chair over the weekend.   Objective: Vital signs in last 24 hours: Temp:  [97.6 F (36.4 C)-98.3 F (36.8 C)] 97.9 F (36.6 C) (02/10 0505) Pulse Rate:  [61-77] 61 (02/10 0505) Resp:  [13-23] 17 (02/10 0412) BP: (101-105)/(40-77) 102/77 (02/10 0505) SpO2:  [96 %-100 %] 100 % (02/10 0505) FiO2 (%):  [28 %] 28 % (02/10 0505) Weight change:  Last BM Date: 04/23/18  Intake/Output from previous day: 02/09 0701 - 02/10 0700 In: 1885 [I.V.:1065; NG/GT:560; IV Piggyback:260] Out: 2650 [Urine:2200; Drains:450] Intake/Output this shift: No intake/output data recorded.  Pt is alert abd is soft G tube in place  Bandage in place over Squamous cel resection on his head I did note his lateral incisions of his mouth appear to be loosening, no sign of infection I did take pictures for Epic   Lab Results: Recent Labs    04/22/18 0251  WBC 6.9  HGB 8.6*  HCT 27.5*  PLT 265   BMET Recent Labs    04/22/18 0251  NA 138  K 4.4  CL 109  CO2 24  GLUCOSE 148*  BUN 15  CREATININE 1.52*  CALCIUM 7.9*    Studies/Results: No results found.  Medications: I have reviewed the patient's current medications.  Assessment/Plan: I will discuss pt with dr. Marla [name]  LOS: 12 days    Anthony [name], [facility] 04/24/2018

## 2018-04-24 NOTE — Progress Notes (Signed)
   Subjective:    Patient ID: Anthony [name], male    DOB: 02-10-1941, 78 y.o.   MRN: [id number]  HPI Receiving bolus feeds.  Doing well overall.  Review of Systems     Objective:   Physical Exam AF VSS NAD, alert Scalp with dressing in place Lower lip reconstruction with breakdown in midline Neck incision clean and intact, removed staples Trach site stable with #6 cuffless Shiley in place with Passy-Muir valve     Assessment & Plan:  Scalp and lower lip cancers s/p resections, reconstructions, bilateral neck dissections, tracheostomy, G-tube  Staples removed from neck.  Trach stable.  G-tube functioning with bolus feeds. Waiting for assessment by Plastics of reconstructions.  Discharge planning otherwise.

## 2018-04-25 ENCOUNTER — Encounter (HOSPITAL_COMMUNITY): Payer: Self-pay | Admitting: Otolaryngology

## 2018-04-25 LAB — GLUCOSE, CAPILLARY
Glucose-Capillary: 103 mg/dL — ABNORMAL HIGH (ref 70–99)
Glucose-Capillary: 115 mg/dL — ABNORMAL HIGH (ref 70–99)
Glucose-Capillary: 123 mg/dL — ABNORMAL HIGH (ref 70–99)
Glucose-Capillary: 135 mg/dL — ABNORMAL HIGH (ref 70–99)
Glucose-Capillary: 165 mg/dL — ABNORMAL HIGH (ref 70–99)
Glucose-Capillary: 168 mg/dL — ABNORMAL HIGH (ref 70–99)

## 2018-04-25 MED ORDER — GABAPENTIN 100 MG PO CAPS
200.0000 mg | ORAL_CAPSULE | Freq: Three times a day (TID) | ORAL | Status: DC
  Administered 2018-04-25 – 2018-04-29 (×12): 200 mg via ORAL
  Filled 2018-04-25 (×12): qty 2

## 2018-04-25 MED ORDER — MEMANTINE HCL 5 MG PO TABS
5.0000 mg | ORAL_TABLET | Freq: Every day | ORAL | Status: DC
  Administered 2018-04-25 – 2018-04-29 (×5): 5 mg via ORAL
  Filled 2018-04-25 (×5): qty 1

## 2018-04-25 MED ORDER — SERTRALINE HCL 50 MG PO TABS
50.0000 mg | ORAL_TABLET | Freq: Every day | ORAL | Status: DC
  Administered 2018-04-25 – 2018-04-29 (×5): 50 mg via ORAL
  Filled 2018-04-25 (×5): qty 1

## 2018-04-25 MED ORDER — BUPROPION HCL 100 MG PO TABS
100.0000 mg | ORAL_TABLET | Freq: Every day | ORAL | Status: DC
  Administered 2018-04-25 – 2018-04-29 (×5): 100 mg via ORAL
  Filled 2018-04-25 (×5): qty 1

## 2018-04-25 NOTE — Progress Notes (Signed)
PROGRESS NOTE    Anthony [name]  OBS:[id number] DOB: [birth year] DOA: 04/12/2018 PCP: Rosita Fire, MD   Brief Narrative:  78 year old with past medical history relevant for dementia, COPD, depression/anxiety, chronic pain, stage III CKD, chronic bladder outlet obstruction status post suprapubic Foley catheter placement admitted on 04/12/2018 resection of squamous cell carcinoma of the lower lip and bilateral selective neck dissections and reconstruction and placement of tracheostomy tube, placement of G-tube on 04/21/2018.  Internal medicine was consulted for a brief episode of NSVT as well as low-grade temperature and concern for possible UTI.   Assessment & Plan:   Active Problems:   Tracheostomy in place [facility])   Cancer of lower lip   Protein-calorie malnutrition, severe   #) NSVT: Electrolytes appear to be within normal limits, will recheck patient has had recent echo that was fairly unremarkable no additional work-up at this time -Continue telemetry -We will recheck electrolytes  #) Squamous cell carcinoma the lower lip status post resection, selective neck dissections, tracheostomy placement: Currently patient is pending further management by plastics per ENT - Will defer further management to ENT and plastic surgery  #) Malignancy: Large malignant growth currently being managed by ENT and surgery.  #) Low-grade temperature: This occurred without any clear source.  Patient was empirically started on Bactrim due to history of suprapubic Foley catheter.  CT chest revealed no source of infection.  #) Protein calorie malnutrition status post PEG tube placement: -Continue tube feeds and free water flushes  #) Stage III 3 CKD: No issues  #) Chronic bladder outlet obstruction status post suprapubic Foley: -No acute issues  #) Depression/anxiety/dementia: - Restart bupropion 100 mg daily - Restart gabapentin 200 mg 3 times daily - Restart memantine 5 mg daily -Restart sertraline  50 mg daily  Fluids: Free water flushes Electrolytes: Monitor and supplement Nutrition: Per above  Prophylaxis: SCDs  Disposition: Per primary team  Full code   Subjective: This morning the patient does not have any complaints.  He is tolerating his tube feeds well.  He denies any nausea, vomiting, diarrhea, cough, congestion, rhinorrhea.  Objective: Vitals:   04/25/18 0319 04/25/18 0500 04/25/18 0828 04/25/18 0920  BP:   (!) 93/49 (!) 93/49  Pulse: 66  66 66  Resp: 14  18 16   Temp:   97.9 F (36.6 C)   TempSrc:   Axillary   SpO2: 98%  98% 100%  Weight:  66.6 kg    Height:        Intake/Output Summary (Last 24 hours) at 04/25/2018 1052 Last data filed at 04/25/2018 0600 Gross per 24 hour  Intake 2510.7 ml  Output 2450 ml  Net 60.7 ml   Filed Weights   04/22/18 0314 04/22/18 1323 04/25/18 0500  Weight: 69.3 kg 69 kg 66.6 kg    Examination:  General exam: Appears calm and comfortable  Respiratory system: Clear to auscultation. Respiratory effort normal. Cardiovascular system: Regular rate and rhythm, no murmurs Gastrointestinal system: Soft, nondistended, no rebound or guarding, plus bowel sounds d. Central nervous system: Alert and oriented.  Grossly intact, moving all extremities Extremities: No lower extremity edema Skin: Well-healed resection site over lower lip, large resection of lower lip, extending up to scalp tracheostomy site is clean dry and intact, PEG tube site is clean dry and intact, suprapubic Foley site is clean dry and intact Psychiatry: Judgement and insight appear normal. Mood & affect appropriate.     Data Reviewed: I have personally reviewed following labs and imaging studies  CBC: Recent Labs  Lab 04/18/18 1924 04/20/18 0301 04/21/18 0311 04/22/18 0251  WBC 10.9* 6.9 8.9 6.9  NEUTROABS  --  5.1  --  5.1  HGB 9.8* 9.7* 8.4* 8.6*  HCT 31.3* 31.2* 26.7* 27.5*  MCV 89.7 87.6 88.7 89.6  PLT 310 287 286 537   Basic Metabolic  Panel: Recent Labs  Lab 04/18/18 1924 04/19/18 0229 04/20/18 0301 04/21/18 0311 04/22/18 0251  NA  --  138 135 137 138  K  --  3.5 4.6 4.8 4.4  CL  --  106 107 108 109  CO2  --  22 20* 20* 24  GLUCOSE  --  119* 118* 129* 148*  BUN  --  32* 23 15 15   CREATININE  --  1.35* 1.42* 1.46* 1.52*  CALCIUM  --  8.6* 8.3* 8.2* 7.9*  MG 2.2  --  2.1  --  2.1  PHOS 3.6  --   --   --  3.6   GFR: Estimated Creatinine Clearance: 37.1 mL/min (A) (by C-G formula based on SCr of 1.52 mg/dL (H)). Liver Function Tests: Recent Labs  Lab 04/20/18 0301  AST 18  ALT 16  ALKPHOS 57  BILITOT 0.4  PROT 6.6  ALBUMIN 2.3*   No results for input(s): LIPASE, AMYLASE in the last 168 hours. No results for input(s): AMMONIA in the last 168 hours. Coagulation Profile: No results for input(s): INR, PROTIME in the last 168 hours. Cardiac Enzymes: No results for input(s): CKTOTAL, CKMB, CKMBINDEX, TROPONINI in the last 168 hours. BNP (last 3 results) No results for input(s): PROBNP in the last 8760 hours. HbA1C: No results for input(s): HGBA1C in the last 72 hours. CBG: Recent Labs  Lab 04/24/18 1726 04/24/18 1941 04/24/18 2314 04/25/18 0310 04/25/18 0827  GLUCAP 157* 90 120* 168* 115*   Lipid Profile: No results for input(s): CHOL, HDL, LDLCALC, TRIG, CHOLHDL, LDLDIRECT in the last 72 hours. Thyroid Function Tests: No results for input(s): TSH, T4TOTAL, FREET4, T3FREE, THYROIDAB in the last 72 hours. Anemia Panel: No results for input(s): VITAMINB12, FOLATE, FERRITIN, TIBC, IRON, RETICCTPCT in the last 72 hours. Sepsis Labs: No results for input(s): PROCALCITON, LATICACIDVEN in the last 168 hours.  No results found for this or any previous visit (from the past 240 hour(s)).       Radiology Studies: No results found.      Scheduled Meds: . bacitracin   Topical BID  . chlorhexidine  15 mL Mouth Rinse BID  . free water  200 mL Per Tube Q8H  . insulin aspart  0-15 Units  Subcutaneous TID WC  . insulin aspart  0-5 Units Subcutaneous QHS  . mouth rinse  15 mL Mouth Rinse q12n4p  . pantoprazole (PROTONIX) IV  40 mg Intravenous Q12H  . white petrolatum   Topical BID   Continuous Infusions: . feeding supplement (OSMOLITE 1.2 CAL) 55 mL/hr at 04/25/18 0600  . lactated ringers 10 mL/hr at 04/25/18 0600  . sulfamethoxazole-trimethoprim 160 mg (04/25/18 1044)     LOS: 13 days    Time spent: [place], MD [facility]  If 7PM-7AM, please contact night-coverage [email or web address] Password [facility] 04/25/2018, 10:52 AM

## 2018-04-25 NOTE — Progress Notes (Signed)
   Subjective:    Patient ID: Anthony [name], male    DOB: Oct 15, 1940, 78 y.o.   MRN: [id number]  HPI No complaints.  Tolerating tube feeds.  Review of Systems     Objective:   Physical Exam AF VSS Alert, NAD Scalp with bandage in place Lower lip with midline dehiscence Trach site stable with #6 cuffless Shiley     Assessment & Plan:  Scalp and lower lip cancers  Doing well with bolus tube feeds.  Trach site stable.  Plastics managing reconstruction efforts.

## 2018-04-25 NOTE — Progress Notes (Signed)
Called to room due to trach dislodgement.  On arrival to room RN at bedside and states that pt pulled trach out.  #6cfls reinserted at this time. Pt tolerated well, RT will monitor

## 2018-04-25 NOTE — Progress Notes (Signed)
PT Cancellation Note  Patient Details Name: Anthony [name] MRN: [id number] DOB: 1940/04/04   Cancelled Treatment:    Reason Eval/Treat Not Completed: Other (comment) RN reports patient pulled his trach out earlier this afternoon and they have just gotten him back into bed/into full restraints, he is very agitated right now. Will try to attempt to return if time and schedule allow.    Deniece Ree PT, DPT, CBIS  Supplemental Physical Therapist [facility]    Pager [phone] Acute Rehab Office [phone]

## 2018-04-25 NOTE — Plan of Care (Signed)
Patient able to communicate around trach, Passy Muir valve at bedside.  Patient able to follow commands oriented x 3 bil wrist restraints removed with verbal teaching to patient to not to touch trach/ IV's etc...  Call bell placed within reach, patient able to demonstrate how to call for assistance if needed.  Will continue to monitor closely

## 2018-04-25 NOTE — Progress Notes (Signed)
OT Cancellation Note  Patient Details Name: Anthony [name] MRN: [id number] DOB: [birth year]   Per discussion with RN- hold therapy as pt just pulled out trach and agitated- pt finally resting now. OT will continue to follow.   Cancelled Treatment:    Reason Eval/Treat Not Completed: Patient not medically ready  Curtis Sites OTR/L  04/25/2018, 2:48 PM

## 2018-04-25 NOTE — Progress Notes (Signed)
Patient ID: Anthony [name], male   DOB: 06/13/1940, 78 y.o.   MRN: [id number] Patient resting in bed.  The left lower lip is in place and seems to be healing.  The right side has not healed and pulled away again.  It is not clear if the patient is pulling at this area.  The scalp is intact and dressing in place.  No sign of infection.  Challenge is now with nutrition.  May need to give him a week or two to catch up on his nutrition before advancing the right flap and placing more acell or skin graft.

## 2018-04-26 LAB — CBC
HCT: 29 % — ABNORMAL LOW (ref 39.0–52.0)
Hemoglobin: 9 g/dL — ABNORMAL LOW (ref 13.0–17.0)
MCH: 27.4 pg (ref 26.0–34.0)
MCHC: 31 g/dL (ref 30.0–36.0)
MCV: 88.4 fL (ref 80.0–100.0)
Platelets: 363 10*3/uL (ref 150–400)
RBC: 3.28 MIL/uL — ABNORMAL LOW (ref 4.22–5.81)
RDW: 15.6 % — ABNORMAL HIGH (ref 11.5–15.5)
WBC: 8.5 K/uL (ref 4.0–10.5)
nRBC: 0 % (ref 0.0–0.2)

## 2018-04-26 LAB — BASIC METABOLIC PANEL
Anion gap: 6 (ref 5–15)
BUN: 23 mg/dL (ref 8–23)
CO2: 27 mmol/L (ref 22–32)
Chloride: 103 mmol/L (ref 98–111)
Creatinine, Ser: 1.57 mg/dL — ABNORMAL HIGH (ref 0.61–1.24)
GFR calc Af Amer: 48 mL/min — ABNORMAL LOW (ref >60)
GFR calc non Af Amer: 41 mL/min — ABNORMAL LOW (ref >60)
Glucose, Bld: 143 mg/dL — ABNORMAL HIGH (ref 70–99)
Potassium: 4.3 mmol/L (ref 3.5–5.1)
Sodium: 136 mmol/L (ref 135–145)

## 2018-04-26 LAB — BASIC METABOLIC PANEL WITH GFR: Calcium: 8.1 mg/dL — ABNORMAL LOW (ref 8.9–10.3)

## 2018-04-26 LAB — GLUCOSE, CAPILLARY
GLUCOSE-CAPILLARY: 119 mg/dL — AB (ref 70–99)
Glucose-Capillary: 130 mg/dL — ABNORMAL HIGH (ref 70–99)
Glucose-Capillary: 106 mg/dL — ABNORMAL HIGH (ref 70–99)
Glucose-Capillary: 134 mg/dL — ABNORMAL HIGH (ref 70–99)
Glucose-Capillary: 139 mg/dL — ABNORMAL HIGH (ref 70–99)

## 2018-04-26 LAB — PREALBUMIN: Prealbumin: 15.4 mg/dL — ABNORMAL LOW (ref 18–38)

## 2018-04-26 LAB — MAGNESIUM: Magnesium: 2.2 mg/dL (ref 1.7–2.4)

## 2018-04-26 MED ORDER — PANTOPRAZOLE SODIUM 40 MG PO PACK
40.0000 mg | PACK | Freq: Two times a day (BID) | ORAL | Status: DC
  Administered 2018-04-26 – 2018-04-29 (×6): 40 mg
  Filled 2018-04-26 (×6): qty 20

## 2018-04-26 NOTE — Progress Notes (Signed)
Subjective: Very pleasant 78 year old male pt continues to be inpatient after resection of squamous cell carcinoma from lower lip and head.  Dr. Marla Roe did re approximate the pts mouth but unfortunately the area is dehiscing on the lateral right and bottom.  There is concerns the pt may be pulling at the area and that his nutrition status could still be compromised.  This could affect his ability to heal.  The pt did pull out his trach yesterday.  The pt denies pain.  He is comfortable resting in his bed today.    Objective: Vital signs in last 24 hours: Temp:  [98 F (36.7 C)-98.9 F (37.2 C)] 98.9 F (37.2 C) (02/12 0804) Pulse Rate:  [61-77] 64 (02/12 0935) Resp:  [13-18] 17 (02/12 0935) BP: (92-107)/(39-66) 99/55 (02/12 0804) SpO2:  [94 %-99 %] 95 % (02/12 0935) FiO2 (%):  [28 %] 28 % (02/12 0935) Weight:  [69.5 kg] 69.5 kg (02/12 0537) Weight change: 2.9 kg Last BM Date: 04/23/18  Intake/Output from previous day: 02/11 0701 - 02/12 0700 In: 2478.6 [I.V.:184.4; NG/GT:2034.2; IV Piggyback:260] Out: 1425 [Urine:1425] Intake/Output this shift: Total I/O In: 115 [I.V.:40; NG/GT:75] Out: -   Pt is alert and able to answer questions pts abd is soft  ROM present in extremities Head wound is covered Dehiscence noted of right lateral and lower incisions  Lab Results: Recent Labs    04/26/18 0248  WBC 8.5  HGB 9.0*  HCT 29.0*  PLT 363   BMET Recent Labs    04/26/18 0248  NA 136  K 4.3  CL 103  CO2 27  GLUCOSE 143*  BUN 23  CREATININE 1.57*  CALCIUM 8.1*    Studies/Results: No results found.  Medications: I have reviewed the patient's current medications.  Assessment/Plan:  Dr. Marla Roe and Dr. Redmond Baseman has discussed this pt.  We are currently allowing this pts nutritional status to improve before considering further surgical intervention.     LOS: 14 days     Melida Gimenez, [facility] 04/26/2018

## 2018-04-26 NOTE — Progress Notes (Signed)
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  Internal medicine was consulted for a brief episode of NSVT as well as low-grade temperature and concern for possible UTI.  New events last 24 hours / Subjective: No complaints of chest pain or abdominal pain today.  Has remained afebrile last 24 hours. Not agitated during my examination   Assessment & Plan:   Active Problems:   Tracheostomy in place [facility])   Cancer of lower lip   Protein-calorie malnutrition, severe   NSVT -On 2/4. Asymptomatic at that time -Echo 09/2017 demonstrated a preserved EF and no regional wall motion abnormalities -Rate controlling med held due to marginal BP  -Normal sinus rhythm currently    Squamous cell carcinoma of lower lip status post resection, bilateral neck dissection, scalp tumor, tracheostomy placement -Per ENT and plastic surgery.  Plan to improve nutritional status, plan for OR in several weeks  Protein calorie malnutrition -Status post PEG tube placement, continue tube feeding  Chronic kidney disease stage III -Baseline creatinine around 1.4-1.7 -Remains stable  Chronic bladder outlet obstruction  -Status post suprapubic catheter  Dementia, depression, anxiety -Continue namenda, sertraline, gabapentin, Wellbutrin -Has required restraints intermittently due to agitation, pulling out his trach  Low grade fever -2/4-2/5. No source of infection found, received Bactrim during work up for UTI.  Now afebrile and off antibiotics   Lung nodule 6 mm left lower lobe -CT of the chest was performed, repeat CT scan of the chest recommended in 6 to 12 months interval.  Probably between July 2020 - December 2020   Antimicrobials:  Anti-infectives (From admission, onward)   Start     Dose/Rate Route Frequency Ordered Stop   04/20/18 2200  sulfamethoxazole-trimethoprim (BACTRIM) 160 mg in dextrose 5 % 250 mL IVPB  Status:  Discontinued     160 mg 260 mL/hr over 60 Minutes Intravenous Every 12 hours 04/20/18 1217 04/25/18 1621   04/20/18 1000  ciprofloxacin (CIPRO) IVPB 400 mg     400 mg 200 mL/hr over 60 Minutes Intravenous To Surgery 04/19/18 1948 04/20/18 1512   04/18/18 2200  sulfamethoxazole-trimethoprim (BACTRIM) 363.04 mg of trimethoprim in dextrose 5 % 500 mL IVPB  Status:  Discontinued     10 mg/kg/day of trimethoprim  72.6 kg 348.5 mL/hr over 90 Minutes Intravenous Every 12 hours 04/18/18 1711 04/20/18 1217   04/17/18 2200  sulfamethoxazole-trimethoprim (BACTRIM,SEPTRA) 200-40 MG/5ML suspension 20 mL  Status:  Discontinued     20 mL Per Tube Every 12 hours 04/17/18 1844 04/18/18 1711   04/12/18 1000  ciprofloxacin (CIPRO) IVPB 400 mg     400 mg 200 mL/hr over 60 Minutes Intravenous On call to O.R. 04/12/18 0844 04/12/18 1039       Objective: Vitals:   04/26/18 0804 04/26/18 0935 04/26/18 1129 04/26/18 1207  BP: (!) 99/55  (!) 100/59 (!) 100/54  Pulse: 63 64 64 77  Resp: 13 17  16   Temp: 98.9 F (37.2 C)   98 F (36.7 C)  TempSrc: Axillary   Axillary  SpO2: 94% 95%  99%  Weight:      Height:        Intake/Output Summary (Last 24 hours) at 04/26/2018 1444 Last data filed at 04/26/2018 1000 Gross per 24 hour  Intake 2665.76 ml  Output 1125 ml  Net 1540.76 ml   Filed Weights   04/22/18 1323 04/25/18 0500 04/26/18 0537  Weight: 69 kg 66.6 kg 69.5 kg    Examination:  General exam: Appears calm and comfortable  Respiratory system: Clear to auscultation.  Respiratory effort normal. Cardiovascular system: S1 & S2 heard, RRR. No JVD, murmurs, rubs, gallops or clicks. No pedal edema. Gastrointestinal system: Abdomen is nondistended, soft and nontender. No organomegaly or masses felt. Normal bowel sounds heard. Central nervous system: Alert  Extremities: Symmetric  Psychiatry: Judgement and insight appear stable   Data Reviewed: I have personally reviewed following labs and imaging studies  CBC: Recent Labs  Lab 04/20/18 0301 04/21/18 0311 04/22/18 0251 04/26/18 0248  WBC 6.9 8.9 6.9 8.5  NEUTROABS 5.1  --  5.1  --   HGB 9.7* 8.4* 8.6* 9.0*  HCT 31.2* 26.7* 27.5* 29.0*  MCV 87.6 88.7 89.6 88.4  PLT 287 286 265 818   Basic Metabolic Panel: Recent Labs  Lab 04/20/18 0301 04/21/18 0311 04/22/18 0251 04/26/18 0248  NA 135 137 138 136  K 4.6 4.8 4.4 4.3  CL 107 108 109 103  CO2 20* 20* 24 27  GLUCOSE 118* 129* 148* 143*  BUN 23 15 15 23   CREATININE 1.42* 1.46* 1.52* 1.57*  CALCIUM 8.3* 8.2* 7.9* 8.1*  MG 2.1  --  2.1 2.2  PHOS  --   --  3.6  --    GFR: Estimated Creatinine Clearance: 37.5 mL/min (A) (by C-G formula based on SCr of 1.57 mg/dL (H)). Liver Function Tests: Recent Labs  Lab 04/20/18 0301  AST 18  ALT 16  ALKPHOS 57  BILITOT 0.4  PROT 6.6  ALBUMIN 2.3*   No results for input(s): LIPASE, AMYLASE in the last 168 hours. No results for input(s): AMMONIA in the last 168 hours. Coagulation Profile: No results for input(s): INR, PROTIME in the last 168 hours. Cardiac Enzymes: No results for input(s): CKTOTAL, CKMB, CKMBINDEX, TROPONINI in the last 168 hours. BNP (last 3 results) No results for input(s): PROBNP in the last 8760 hours. HbA1C: No results for input(s): HGBA1C in the last 72 hours. CBG: Recent Labs  Lab 04/25/18 1947 04/25/18 2333 04/26/18 0323 04/26/18 0802 04/26/18 1206  GLUCAP 103* 135* 130* 106* 134*   Lipid Profile: No results for input(s): CHOL, HDL, LDLCALC, TRIG, CHOLHDL,  LDLDIRECT in the last 72 hours. Thyroid Function Tests: No results for input(s): TSH, T4TOTAL, FREET4, T3FREE, THYROIDAB in the last 72 hours. Anemia Panel: No results for input(s): VITAMINB12, FOLATE, FERRITIN, TIBC, IRON, RETICCTPCT in the last 72 hours. Sepsis Labs: No results for input(s): PROCALCITON, LATICACIDVEN in the last 168 hours.  No results found for this or any previous visit (from the past 240 hour(s)).     Radiology Studies: No results found.    Scheduled Meds: . buPROPion  100 mg Oral Daily  . chlorhexidine  15 mL Mouth Rinse BID  . free water  200 mL Per Tube Q8H  . gabapentin  200 mg Oral TID  . insulin aspart  0-15 Units Subcutaneous TID WC  . insulin aspart  0-5 Units Subcutaneous QHS  . mouth rinse  15 mL Mouth Rinse q12n4p  . memantine  5 mg Oral Daily  .  pantoprazole (PROTONIX) IV  40 mg Intravenous Q12H  . sertraline  50 mg Oral Daily  . white petrolatum   Topical BID   Continuous Infusions: . feeding supplement (OSMOLITE 1.2 CAL) 1,000 mL (04/26/18 0917)  . lactated ringers 10 mL/hr at 04/25/18 0600     LOS: 14 days    Time spent: 45 minutes   Dessa Phi, DO [facility] [email or web address] 04/26/2018, 2:44 PM

## 2018-04-26 NOTE — Progress Notes (Signed)
Subjective: POD 6 from lower lip and scalp. He is resting in the bed.  No complaints except that he wants to eat.  This is understandable.    Objective: Vital signs in last 24 hours: Temp:  [98 F (36.7 C)-98.9 F (37.2 C)] 98 F (36.7 C) (02/12 1207) Pulse Rate:  [61-77] 77 (02/12 1207) Resp:  [13-18] 16 (02/12 1207) BP: (92-107)/(49-66) 100/54 (02/12 1207) SpO2:  [94 %-99 %] 99 % (02/12 1207) FiO2 (%):  [28 %] 28 % (02/12 1129) Weight:  [69.5 kg] 69.5 kg (02/12 0537) Weight change: 2.9 kg Last BM Date: 04/23/18  Intake/Output from previous day: 02/11 0701 - 02/12 0700 In: 2478.6 [I.V.:184.4; NG/GT:2034.2; IV Piggyback:260] Out: 1425 [Urine:1425] Intake/Output this shift: Total I/O In: 187.2 [I.V.:40; NG/GT:147.2] Out: 400 [Urine:400]  General appearance: alert and cooperative Incision/Wound: the left lip is holding but the right has not.  Due to the complete separation I am concerned the patient is pulling at the area.  Lab Results: Recent Labs    04/26/18 0248  WBC 8.5  HGB 9.0*  HCT 29.0*  PLT 363   BMET Recent Labs    04/26/18 0248  NA 136  K 4.3  CL 103  CO2 27  GLUCOSE 143*  BUN 23  CREATININE 1.57*  CALCIUM 8.1*    Studies/Results: No results found.  Medications: I have reviewed the patient's current medications.  Assessment/Plan: Lip cancer and reconstruction. Spoke with Dr. Redmond Baseman today and we agree that the nutrition status of the patient is contributing to the slow or poor healing.   I would like to check a prealbumin.  Will plan to bring him back to the OR in the next 2-3 weeks for repair of lip and further treatment on the scalp.  Will likely need another round of Acell and a skin graft.    LOS: 14 days   Wallace Going 04/26/2018, 1:43 PM

## 2018-04-26 NOTE — Progress Notes (Signed)
   Subjective:    Patient ID: Anthony [name], male    DOB: 1940-11-26, 78 y.o.   MRN: [id number]  HPI Doing well with no complaints.  Review of Systems     Objective:   Physical Exam AF VSS Alert, NAD Scalp with bandage in place Lower lip reconstruction with midline dehiscence Trach site stable with cuffless #6 Shiley tube Neck incision clean and intact    Assessment & Plan:  Scalp and lower lip cancer s/p resections, reconstructions, tracheostomy, and bilateral neck dissection.  Malnutrition s/p G-tube placement.  Tolerating tube feeds well.  Has required restraints due to pulling out trach tube, currently stable.  Discussed discharge planning with Education officer, museum.  Will try to discontinue restraints.

## 2018-04-26 NOTE — Progress Notes (Signed)
Occupational Therapy Treatment Patient Details Name: Anthony [name] MRN: [id number] DOB: Apr 30, 1940 Today's Date: 04/26/2018    History of present illness 78 yo male admitted for management of squamous cell carcinoma of lip; now s/p total lower lip resection, tracheostomy, Reconstruction of lower lip defect, bilateral radical neck dissection due to enlarged nodes in both superior necks;  has a pertinent past medical history of Acute pyelonephritis (12/23/2014), Arthritis, Asthma, BPH (benign prostatic hyperplasia), Cancer ([place]), Chronic back pain, Chronic hip pain, CKD (chronic kidney disease), COPD (chronic obstructive pulmonary disease) ([place]), Dementia ([place]), Depression, Dysphagia, GERD (gastroesophageal reflux disease), Headache, History of kidney stones, Hypertension, Kidney stone, Neuropathy, Pneumonia, PTSD (post-traumatic stress disorder), Pulmonary nodules (12/25/2014), and Restless leg syndrome.   OT comments  Pt making great progress toward OT goals today. Pt has made functional gains in bed mobility, LB dressing, and ADL transfers. Pt in great spirits and motivated to work with therapy despite pain in B knees. Pt required +2 min-mod A for sit <> stand and lateral stepping. SNF remains appropriate d/c.   Follow Up Recommendations  SNF    Equipment Recommendations  None recommended by OT    Recommendations for Other Services      Precautions / Restrictions Precautions Precautions: Fall;Other (comment) Precaution Comments: Trach, NG tube Restrictions Weight Bearing Restrictions: No       Mobility Bed Mobility Overal bed mobility: Needs Assistance Bed Mobility: Sidelying to Sit   Sidelying to sit: HOB elevated;Min guard   Sit to supine: Min guard   General bed mobility comments: min guard to come to sitting,   Transfers Overall transfer level: Needs assistance Equipment used: Rolling walker (2 wheeled);2 person hand held assist Transfers: Sit to/from Stand Sit to  Stand: +2 physical assistance;+2 safety/equipment;Min assist         General transfer comment: Pt took several lateral steps EOB    Balance Overall balance assessment: Needs assistance Sitting-balance support: Feet supported Sitting balance-Leahy Scale: Fair Sitting balance - Comments: Pt able to figure 4 sitting EOB with posterior lean Postural control: Posterior lean Standing balance support: Bilateral upper extremity supported;During functional activity Standing balance-Leahy Scale: Poor Standing balance comment: heavy reliant on B UE support                            ADL either performed or assessed with clinical judgement   ADL Overall ADL's : Needs assistance/impaired                     Lower Body Dressing: Minimal assistance;Sitting/lateral leans Lower Body Dressing Details (indicate cue type and reason): Able to don socks sitting EOB by using figure 4 technique             Functional mobility during ADLs: +2 for physical assistance;+2 for safety/equipment;Rolling walker;Moderate assistance General ADL Comments: Pt able to complete 3x sit <> stand EOB, able to stand for ~30 seconds at most               Cognition Arousal/Alertness: Awake/alert Behavior During Therapy: [facility] for tasks assessed/performed Overall Cognitive Status: No family/caregiver present to determine baseline cognitive functioning                                 General Comments: pt engaged conversationally, with comedic input;pt appeared motivated to participate during session  General Comments All VSS, pt in good spirits. Pt able to remain off trach throughout session    Pertinent Vitals/ Pain       Pain Assessment: Faces Faces Pain Scale: Hurts little more Pain Location: B knees Pain Descriptors / Indicators: Grimacing Pain Intervention(s): Monitored during session         Frequency  Min 2X/week        Progress Toward  Goals  OT Goals(current goals can now be found in the care plan section)  Progress towards OT goals: Progressing toward goals  Acute Rehab OT Goals Patient Stated Goal: "run out of here" OT Goal Formulation: With patient Time For Goal Achievement: 05/04/18 Potential to Achieve Goals: Good  Plan Discharge plan remains appropriate    Co-evaluation    PT/OT/SLP Co-Evaluation/Treatment: Yes     OT goals addressed during session: ADL's and self-care      AM-PAC OT "6 Clicks" Daily Activity     Outcome Measure   Help from another person eating meals?: Total Help from another person taking care of personal grooming?: A Little Help from another person toileting, which includes using toliet, bedpan, or urinal?: A Lot Help from another person bathing (including washing, rinsing, drying)?: A Lot Help from another person to put on and taking off regular upper body clothing?: A Little Help from another person to put on and taking off regular lower body clothing?: A Little 6 Click Score: 14    End of Session Equipment Utilized During Treatment: Rolling walker  OT Visit Diagnosis: Unsteadiness on feet (R26.81);Muscle weakness (generalized) (M62.81);Pain   Activity Tolerance Patient tolerated treatment well   Patient Left in bed;with call bell/phone within reach;with bed alarm set   Nurse Communication Mobility status        Time: 7371-0626 OT Time Calculation (min): 28 min  Charges: OT General Charges $OT Visit: 1 Visit OT Treatments $Therapeutic Activity: 8-22 mins   Anthony [name] OTR/L  04/26/2018, 5:02 PM

## 2018-04-26 NOTE — Progress Notes (Signed)
Physical Therapy Treatment Patient Details Name: Anthony [name] MRN: [id number] DOB: [birth year] Today's Date: 04/26/2018    History of Present Illness 78 yo male admitted for management of squamous cell carcinoma of lip; now s/p total lower lip resection, tracheostomy, Reconstruction of lower lip defect, bilateral radical neck dissection due to enlarged nodes in both superior necks;  has a pertinent past medical history of Acute pyelonephritis (12/23/2014), Arthritis, Asthma, BPH (benign prostatic hyperplasia), Cancer ([place]), Chronic back pain, Chronic hip pain, CKD (chronic kidney disease), COPD (chronic obstructive pulmonary disease) ([place]), Dementia ([place]), Depression, Dysphagia, GERD (gastroesophageal reflux disease), Headache, History of kidney stones, Hypertension, Kidney stone, Neuropathy, Pneumonia, PTSD (post-traumatic stress disorder), Pulmonary nodules (12/25/2014), and Restless leg syndrome.    PT Comments    Patient progressing with therapy today, standing EOB and side stepping for first time. Patient unable to extend knee fully discussed therex to improve ROM. Tolerating session of TC, VSS although some hypotension noted prior to session. (MAP of 60). Patient enjoys working with therapy, unable to tolerate standing due to knee pain and fatigue  for longer than 30 second increments.     Follow Up Recommendations  SNF     Equipment Recommendations  None recommended by PT    Recommendations for Other Services       Precautions / Restrictions Precautions Precautions: Fall;Other (comment) Precaution Comments: Trach, NG tube Restrictions Weight Bearing Restrictions: No    Mobility  Bed Mobility Overal bed mobility: Needs Assistance Bed Mobility: Sidelying to Sit   Sidelying to sit: HOB elevated;Min guard   Sit to supine: Min guard   General bed mobility comments: min guard to come to sitting,   Transfers Overall transfer level: Needs assistance Equipment used: Rolling  walker (2 wheeled);2 person hand held assist Transfers: Sit to/from Stand Sit to Stand: +2 physical assistance;+2 safety/equipment;Min assist         General transfer comment: Pt took several lateral steps EOB with premature sit, unable to extend knee in standing   Ambulation/Gait                 Stairs             Wheelchair Mobility    Modified Rankin (Stroke Patients Only)       Balance Overall balance assessment: Needs assistance Sitting-balance support: Feet supported Sitting balance-Leahy Scale: Fair Sitting balance - Comments: Pt able to figure 4 sitting EOB with posterior lean Postural control: Posterior lean Standing balance support: Bilateral upper extremity supported;During functional activity Standing balance-Leahy Scale: Poor Standing balance comment: heavy reliant on B UE support                             Cognition Arousal/Alertness: Awake/alert Behavior During Therapy: WFL for tasks assessed/performed Overall Cognitive Status: No family/caregiver present to determine baseline cognitive functioning                                 General Comments: pt engaged conversationally, with comedic input;pt appeared motivated to participate during session       Exercises      General Comments General comments (skin integrity, edema, etc.): All VSS, pt in RA during session       Pertinent Vitals/Pain Pain Assessment: Faces Faces Pain Scale: Hurts little more Pain Location: B knees Pain Descriptors / Indicators: Grimacing Pain Intervention(s): Monitored during session;Limited activity within  patient's tolerance    Home Living                      Prior Function            PT Goals (current goals can now be found in the care plan section) Acute Rehab PT Goals Patient Stated Goal: "run out of here" PT Goal Formulation: Patient unable to participate in goal setting Time For Goal Achievement:  04/30/18 Potential to Achieve Goals: Fair Progress towards PT goals: Progressing toward goals    Frequency    Min 2X/week      PT Plan Current plan remains appropriate    Co-evaluation PT/OT/SLP Co-Evaluation/Treatment: Yes Reason for Co-Treatment: For patient/therapist safety PT goals addressed during session: Mobility/safety with mobility;Balance;Proper use of DME;Strengthening/ROM OT goals addressed during session: ADL's and self-care      AM-PAC PT "6 Clicks" Mobility   Outcome Measure  Help needed turning from your back to your side while in a flat bed without using bedrails?: A Little Help needed moving from lying on your back to sitting on the side of a flat bed without using bedrails?: A Lot Help needed moving to and from a bed to a chair (including a wheelchair)?: A Lot Help needed standing up from a chair using your arms (e.g., wheelchair or bedside chair)?: A Lot Help needed to walk in hospital room?: A Lot Help needed climbing 3-5 steps with a railing? : Total 6 Click Score: 12    End of Session Equipment Utilized During Treatment: Oxygen Activity Tolerance: Patient limited by fatigue Patient left: in bed;with bed alarm set;with restraints reapplied Nurse Communication: Mobility status PT Visit Diagnosis: Muscle weakness (generalized) (M62.81);Other abnormalities of gait and mobility (R26.89)     Time: 1620-1650 PT Time Calculation (min) (ACUTE ONLY): 30 min  Charges:  $Therapeutic Activity: 8-22 mins                     Reinaldo Berber, PT, DPT Acute Rehabilitation Services Pager: [phone] Office: [phone]     Reinaldo Berber 04/26/2018, 5:48 PM

## 2018-04-26 NOTE — Progress Notes (Addendum)
Nutrition Follow Up  DOCUMENTATION CODES:   Severe malnutrition in context of chronic illness  INTERVENTION:    Osmolite 1.2 formula at goal rate of 75 ml/hr  Free water flushes at 200 ml QID   Provides 2160 kcals, 100 gm protein, 1476 ml free water daily  NUTRITION DIAGNOSIS:   Severe Malnutrition related to chronic illness as evidenced by energy intake < or equal to 75% for > or equal to 1 month, severe fat depletion, severe muscle depletion, ongoing  GOAL:   Patient will meet greater than or equal to 90% of their needs, met   MONITOR:   TF tolerance, Labs, Skin, Weight trends, I & O's  ASSESSMENT:   78 yo male with cancer of external lower lip that has been affecting ability to eat admitted for surgery.  Biopsy confirms squamous cell cancer with imaging confirming enlarged nodes bilaterally. Pt also with sizeable tumor on the right vertex of scalp that has been removed twice previously. PMH includes COPD, CKD, dementia, dysphagia  1/29 total lower lip resection, bilateral selective neck dissections and trach placement; plastics reconstructed lower lip defect, NG tube placed  Pt transferred out of [facility] 1/30. He is a ward of the state and resides at [facility]. S/p G-tube placement per Dr. Kieth Brightly 2/6.  Pt pulled out his trach again yesterday; restraints applied.  Osmolite 1.2 formula infusing at goal of 75 ml/hr. Spoke with Loyola Mast, RN; tolerating well.  Weight trends appear to be back to baseline. Labs & medications reviewed. CBG's [id number].  Prealbumin strongly affected by stress response and inflammatory process, therefore, may not see an improvement during acute hospitalization.  Diet Order:   Diet Order            Diet NPO time specified  Diet effective now             EDUCATION NEEDS:   Not appropriate for education at this time  Skin:  Skin Assessment: Skin Integrity Issues: Skin Integrity Issues:: Incisions Incisions: Surgical  incisions to neck, lip, head and abdomen  Last BM:  2/9  Height:   Ht Readings from Last 1 Encounters:  04/20/18 _0  (1.905 m)   Weight:   Wt Readings from Last 1 Encounters:  04/26/18 69.5 kg   BMI:  Body mass index is 19.15 kg/m.  Estimated Nutritional Needs:   Kcal:  2100-2300  Protein:  100-115 gm  Fluid:  2.1-2.3 L  Arthur Holms, RD, LDN Pager #: [phone] After-Hours Pager #: [phone]

## 2018-04-27 LAB — GLUCOSE, CAPILLARY
GLUCOSE-CAPILLARY: 107 mg/dL — AB (ref 70–99)
GLUCOSE-CAPILLARY: 127 mg/dL — AB (ref 70–99)
Glucose-Capillary: 120 mg/dL — ABNORMAL HIGH (ref 70–99)
Glucose-Capillary: 127 mg/dL — ABNORMAL HIGH (ref 70–99)
Glucose-Capillary: 133 mg/dL — ABNORMAL HIGH (ref 70–99)
Glucose-Capillary: 133 mg/dL — ABNORMAL HIGH (ref 70–99)

## 2018-04-27 NOTE — Progress Notes (Signed)
   Subjective:    Patient ID: Anthony [name], male    DOB: 1940-04-08, 78 y.o.   MRN: [id number]  HPI No complaints.  He has been without restraints for 24 hours.  Review of Systems     Objective:   Physical Exam AF VSS Alert, NAD Scalp dressing in place Lower lip wound open with flap of tissue to right Trach site stable Neck incision healing    Assessment & Plan:  Scalp and lip cancers s/p resection, reconstructions, tracheostomy, neck dissections Malnutrition s/p G-tube placement  He has been without restraints for 24 hours.  Will move toward discharge with trach in place, nutrition via G-tube.  He needs time and nutrition before Dr. Marla Roe can take further reconstructive steps.

## 2018-04-27 NOTE — Progress Notes (Signed)
Anthony [name] is a 78 y.o. male patient. 1. Lip cancer    Past Medical History:  Diagnosis Date  . Acute pyelonephritis 12/23/2014  . Arthritis   . Asthma   . BPH (benign prostatic hyperplasia)   . Cancer ([place])    Lip cancer,has a place on the top of head  . Chronic back pain   . Chronic hip pain   . CKD (chronic kidney disease)   . COPD (chronic obstructive pulmonary disease) ([place])   . Dementia ([place])   . Depression   . Dysphagia   . GERD (gastroesophageal reflux disease)   . Headache   . History of kidney stones   . Hypertension    no longer on medication  . Kidney stone   . Neuropathy   . Pneumonia    november 2019  bilateral  . PTSD (post-traumatic stress disorder)   . Pulmonary nodules 12/25/2014  . Restless leg syndrome    Current Facility-Administered Medications  Medication Dose Route Frequency Provider Last Rate Last Dose  . buPROPion [facility]) tablet 100 mg  100 mg Oral Daily Purohit, Shrey C, MD   100 mg at 04/27/18 0856  . chlorhexidine (PERIDEX) 0.12 % solution 15 mL  15 mL Mouth Rinse BID Kinsinger, Arta Bruce, MD   15 mL at 04/27/18 1011  . feeding supplement (OSMOLITE 1.2 CAL) liquid 1,000 mL  1,000 mL Per Tube Continuous Swayze, Ava, DO 75 mL/hr at 04/26/18 2346 1,000 mL at 04/26/18 2346  . free water 200 mL  200 mL Per Tube Q8H Melida Quitter, MD   200 mL at 04/27/18 1351  . gabapentin (NEURONTIN) capsule 200 mg  200 mg Oral TID Purohit, Shrey C, MD   200 mg at 04/27/18 0855  . insulin aspart (novoLOG) injection 0-15 Units  0-15 Units Subcutaneous TID WC Melida Quitter, MD   2 Units at 04/27/18 0820  . insulin aspart (novoLOG) injection 0-5 Units  0-5 Units Subcutaneous QHS Melida Quitter, MD      . lactated ringers infusion   Intravenous Continuous Melida Quitter, MD 10 mL/hr at 04/26/18 2103    . MEDLINE mouth rinse  15 mL Mouth Rinse q12n4p Kinsinger, Arta Bruce, MD   15 mL at 04/27/18 1217  . memantine (NAMENDA) tablet 5 mg  5 mg Oral Daily Purohit, Shrey  C, MD   5 mg at 04/27/18 0855  . morphine 2 MG/ML injection 2-4 mg  2-4 mg Intravenous Q2H PRN Kinsinger, Arta Bruce, MD   2 mg at 04/27/18 0856  . ondansetron (ZOFRAN) injection 4 mg  4 mg Intravenous Q4H PRN Kinsinger, Arta Bruce, MD   4 mg at 04/21/18 1419  . oxyCODONE (ROXICODONE) 5 MG/5ML solution 5 mg  5 mg Per Tube Q6H PRN Kinsinger, Arta Bruce, MD   5 mg at 04/26/18 1954  . pantoprazole sodium (PROTONIX) 40 mg/20 mL oral suspension 40 mg  40 mg Per Tube BID Dessa Phi, DO   40 mg at 04/27/18 1011  . sertraline (ZOLOFT) tablet 50 mg  50 mg Oral Daily Purohit, Shrey C, MD   50 mg at 04/27/18 0856  . white petrolatum (VASELINE) gel   Topical BID Kinsinger, Arta Bruce, MD       Active Problems:   Tracheostomy in place [facility])   Cancer of lower lip   Protein-calorie malnutrition, severe  Blood pressure (!) 93/46, pulse (!) 59, temperature 98.1 F (36.7 C), temperature source Axillary, resp. rate 17, height 6\' 3"  (1.905 m),  weight 65.8 kg, SpO2 99 %.  Subjective  Patient is resting in bed.  He is appropriate with responses.  No change in wounds from yesterday.  The left lower lip is holding on at this time.  The right has been separated.  It is clear now that he has been playing with the flaps which is hard not to do. Objective No fever or sign of infection at the lip or scalp. Assessment & Plan  SCC of lower lip and scalp.  Plan for return to rehab and follow up with me in 2 weeks. Patient seems to be in agreement.  Loel Lofty Jami Ohlin 04/27/2018

## 2018-04-27 NOTE — Clinical Social Work Note (Signed)
Pt will d/c to [facility] tomorrow. Confirmed with facility and notified MD.  Loletha Grayer, [place]

## 2018-04-27 NOTE — Progress Notes (Signed)
Pts lip incision has opened and has a skin flap hanging. Skin flap left open and no dressing applied. Md paged and was told that he was in surgery. Awaiting MD assessment and or new orders at this time.

## 2018-04-27 NOTE — Progress Notes (Addendum)
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  Internal medicine was consulted for a brief episode of NSVT as well as low-grade temperature and concern for possible UTI.  New events last 24 hours / Subjective: Complaining of back pain today.  No other complaints such as chest pain or abdominal pain.  He is alert and oriented x3 and off of restraints since yesterday afternoon.  Stable for discharge from my perspective.  SNF placement pending, needs to be off of restraints for 24 hours.    Assessment & Plan:   Active Problems:   Tracheostomy in place [facility])   Cancer of lower lip   Protein-calorie malnutrition, severe   NSVT -On 2/4. Asymptomatic at that time -Echo 09/2017 demonstrated a preserved EF and no regional wall motion abnormalities -Rate controlling med held due to marginal BP  -Normal sinus rhythm currently.  Telemetry reviewed personally  Squamous cell carcinoma of lower lip status post resection, bilateral neck dissection, scalp tumor, tracheostomy placement -Per ENT and plastic surgery.  Plan to improve nutritional status, plan for OR in several weeks  Protein calorie malnutrition -Status post PEG tube placement, continue tube feeding  Chronic kidney disease stage III -Baseline creatinine around 1.4-1.7 -Remains stable  Chronic bladder outlet obstruction  -Status post suprapubic catheter  Dementia, depression, anxiety -Continue namenda, sertraline, gabapentin,  Wellbutrin -Has required restraints intermittently due to agitation, pulling out his trach.  Currently mentation is stable, off of restraints since 2/12  Low grade fever -2/4-2/5. No source of infection found, received Bactrim during work up for UTI. Now afebrile and off antibiotics   Lung nodule 6 mm left lower lobe -CT of the chest was performed, repeat CT scan of the chest recommended in 6 to 12 months interval.  Probably between July 2020 - December 2020   Antimicrobials:  Anti-infectives (From admission, onward)   Start     Dose/Rate Route Frequency Ordered Stop   04/20/18 2200  sulfamethoxazole-trimethoprim (BACTRIM) 160 mg in dextrose 5 % 250 mL IVPB  Status:  Discontinued     160 mg 260 mL/hr over 60 Minutes Intravenous Every 12 hours 04/20/18 1217 04/25/18 1621   04/20/18 1000  ciprofloxacin (CIPRO) IVPB 400 mg     400 mg 200 mL/hr over 60 Minutes Intravenous To Surgery 04/19/18 1948 04/20/18 1512   04/18/18 2200  sulfamethoxazole-trimethoprim (BACTRIM) 363.04 mg of trimethoprim in dextrose 5 % 500 mL IVPB  Status:  Discontinued     10 mg/kg/day of trimethoprim  72.6 kg 348.5 mL/hr over 90 Minutes Intravenous Every 12 hours 04/18/18 1711 04/20/18 1217   04/17/18 2200  sulfamethoxazole-trimethoprim (BACTRIM,SEPTRA) 200-40 MG/5ML suspension 20 mL  Status:  Discontinued     20 mL Per Tube Every 12 hours 04/17/18 1844 04/18/18 1711   04/12/18 1000  ciprofloxacin (CIPRO) IVPB 400 mg     400 mg 200 mL/hr over 60 Minutes Intravenous On call to O.R. 04/12/18 0844 04/12/18 1039       Objective: Vitals:   04/27/18 0800 04/27/18 0856 04/27/18 0900 04/27/18 0922  BP:  Pulse: (!) 58 (!) 146 (!) 59 (!) 56  Resp: 15 17 16 17   Temp:      TempSrc:      SpO2: 98% 96% 99% 96%  Weight:      Height:        Intake/Output Summary (Last 24 hours) at 04/27/2018 1034 Last data filed at 04/27/2018 0500 Gross per 24 hour  Intake 832.19 ml  Output 1275 ml  Net -442.81 ml   Filed  Weights   04/25/18 0500 04/26/18 0537 04/27/18 0436  Weight: 66.6 kg 69.5 kg 65.8 kg   Examination: General exam: Appears calm and comfortable  Respiratory system: Clear to auscultation. Respiratory effort normal. Cardiovascular system: S1 & S2 heard, RRR. No JVD, murmurs, rubs, gallops or clicks. No pedal edema. Gastrointestinal system: Abdomen is nondistended, soft and nontender. No organomegaly or masses felt. Normal bowel sounds heard. Central nervous system: Alert and oriented x3. No focal neurological deficits. Extremities: Symmetric 5 x 5 power. Skin: No rashes, lesions or ulcers Psychiatry: Judgement and insight appear normal. Mood & affect appropriate.    Data Reviewed: I have personally reviewed following labs and imaging studies  CBC: Recent Labs  Lab 04/21/18 0311 04/22/18 0251 04/26/18 0248  WBC 8.9 6.9 8.5  NEUTROABS  --  5.1  --   HGB 8.4* 8.6* 9.0*  HCT 26.7* 27.5* 29.0*  MCV 88.7 89.6 88.4  PLT 286 265 387   Basic Metabolic Panel: Recent Labs  Lab 04/21/18 0311 04/22/18 0251 04/26/18 0248  NA 137 138 136  K 4.8 4.4 4.3  CL 108 109 103  CO2 20* 24 27  GLUCOSE 129* 148* 143*  BUN 15 15 23   CREATININE 1.46* 1.52* 1.57*  CALCIUM 8.2* 7.9* 8.1*  MG  --  2.1 2.2  PHOS  --  3.6  --    GFR: Estimated Creatinine Clearance: 35.5 mL/min (A) (by C-G formula based on SCr of 1.57 mg/dL (H)). Liver Function Tests: No results for input(s): AST, ALT, ALKPHOS, BILITOT, PROT, ALBUMIN in the last 168 hours. No results for input(s): LIPASE, AMYLASE in the last 168 hours. No results for input(s): AMMONIA in the last 168 hours. Coagulation Profile: No results for input(s): INR, PROTIME in the last 168 hours. Cardiac Enzymes: No results for input(s): CKTOTAL, CKMB, CKMBINDEX, TROPONINI in the last 168 hours. BNP (last 3 results) No results for input(s): PROBNP in the last 8760 hours. HbA1C: No results for input(s): HGBA1C in the last 72 hours. CBG: Recent Labs    Lab 04/26/18 1705 04/26/18 1955 04/26/18 2343 04/27/18 0434 04/27/18 0736  GLUCAP 119* 139* 120* 133* 133*   Lipid Profile: No results for input(s): CHOL, HDL, LDLCALC, TRIG, CHOLHDL, LDLDIRECT in the last 72 hours. Thyroid Function Tests: No results for input(s): TSH, T4TOTAL, FREET4, T3FREE, THYROIDAB in the last 72 hours. Anemia Panel: No results for input(s): VITAMINB12, FOLATE, FERRITIN, TIBC, IRON, RETICCTPCT in the last 72 hours. Sepsis Labs: No results for input(s): PROCALCITON, LATICACIDVEN in the last 168 hours.  No results found for this or any previous visit (from the past 240 hour(s)).     Radiology Studies: No results found.    Scheduled Meds: . buPROPion  100 mg Oral Daily  . chlorhexidine  15 mL Mouth Rinse BID  . free water  200 mL Per Tube Q8H  . gabapentin  200 mg Oral TID  . insulin aspart  0-15 Units Subcutaneous TID WC  . insulin aspart  0-5 Units Subcutaneous QHS  .  mouth rinse  15 mL Mouth Rinse q12n4p  . memantine  5 mg Oral Daily  . pantoprazole sodium  40 mg Per Tube BID  . sertraline  50 mg Oral Daily  . white petrolatum   Topical BID   Continuous Infusions: . feeding supplement (OSMOLITE 1.2 CAL) 1,000 mL (04/26/18 2346)  . lactated ringers 10 mL/hr at 04/26/18 2103     LOS: 15 days    Time spent: 20 minutes   Dessa Phi, DO [facility] [email or web address] 04/27/2018, 10:34 AM

## 2018-04-28 LAB — GLUCOSE, CAPILLARY
GLUCOSE-CAPILLARY: 118 mg/dL — AB (ref 70–99)
Glucose-Capillary: 108 mg/dL — ABNORMAL HIGH (ref 70–99)
Glucose-Capillary: 126 mg/dL — ABNORMAL HIGH (ref 70–99)
Glucose-Capillary: 133 mg/dL — ABNORMAL HIGH (ref 70–99)
Glucose-Capillary: 149 mg/dL — ABNORMAL HIGH (ref 70–99)

## 2018-04-28 MED ORDER — FREE WATER: 200.0000 mL | Freq: Three times a day (TID) | 0 refills | Status: AC

## 2018-04-28 MED ORDER — OSMOLITE 1.2 CAL PO LIQD: 1000.0000 mL | ORAL | 0 refills | Status: DC

## 2018-04-28 MED ORDER — MEMANTINE HCL 5 MG PO TABS: 5.0000 mg | ORAL_TABLET | Freq: Every day | ORAL | Status: AC

## 2018-04-28 MED ORDER — WHITE PETROLATUM EX OINT: 1.0000 "application " | TOPICAL_OINTMENT | Freq: Two times a day (BID) | CUTANEOUS | 0 refills | Status: DC

## 2018-04-28 MED ORDER — OXYCODONE HCL 5 MG/5ML PO SOLN: 5.0000 mg | Freq: Four times a day (QID) | ORAL | 0 refills | Status: DC | PRN

## 2018-04-28 NOTE — Progress Notes (Signed)
Called to pt room.  Pt states his dsg on his head was coming off.  Pt crying with pain and medicated with 4 mg morphine.  Remainder of dsg removed.  Drainage with foul odor.  Cleaned area with sterile saline and replaced dsg.

## 2018-04-28 NOTE — Progress Notes (Signed)
Physical Therapy Treatment Patient Details Name: Anthony [name] MRN: [id number] DOB: Nov 25, 1940 Today's Date: 04/28/2018    History of Present Illness 78 yo male admitted for management of squamous cell carcinoma of lip; now s/p total lower lip resection, tracheostomy, Reconstruction of lower lip defect, bilateral radical neck dissection due to enlarged nodes in both superior necks;  has a pertinent past medical history of Acute pyelonephritis (12/23/2014), Arthritis, Asthma, BPH (benign prostatic hyperplasia), Cancer ([place]), Chronic back pain, Chronic hip pain, CKD (chronic kidney disease), COPD (chronic obstructive pulmonary disease) ([place]), Dementia ([place]), Depression, Dysphagia, GERD (gastroesophageal reflux disease), Headache, History of kidney stones, Hypertension, Kidney stone, Neuropathy, Pneumonia, PTSD (post-traumatic stress disorder), Pulmonary nodules (12/25/2014), and Restless leg syndrome.    PT Comments    Patient progressing well with therapy, requiring less assistance today for stand pivot transfers to chair. VSS on RA during visit. Pt has been compliant with knee ROM exercises and is tolerating better standing posture today compared to prior visit, will need to further this at next venue.     Follow Up Recommendations  SNF     Equipment Recommendations  None recommended by PT    Recommendations for Other Services       Precautions / Restrictions Precautions Precautions: Fall;Other (comment) Precaution Comments: Trach, NG tube Restrictions Weight Bearing Restrictions: No    Mobility  Bed Mobility Overal bed mobility: Needs Assistance Bed Mobility: Supine to Sit   Sidelying to sit: HOB elevated;Supervision       General bed mobility comments: able to come to sitting without physical assitance   Transfers Overall transfer level: Needs assistance Equipment used: Rolling walker (2 wheeled);2 person hand held assist Transfers: Sit to/from [employer]  Transfers Sit to Stand: +2 physical assistance;+2 safety/equipment;Min assist Stand pivot transfers: +2 physical assistance;+2 safety/equipment;Max assist;Min assist      Lateral/Scoot Transfers: Mod assist;+2 physical assistance General transfer comment: Patient performing stand pivot transfers with decreased assistance today, mod A of 1, 2 for line mgt.   Ambulation/Gait             General Gait Details: deferred today due to preamture sit during transfer   Stairs             Wheelchair Mobility    Modified Rankin (Stroke Patients Only)       Balance Overall balance assessment: Needs assistance Sitting-balance support: Feet supported Sitting balance-Leahy Scale: Fair     Standing balance support: Bilateral upper extremity supported;During functional activity Standing balance-Leahy Scale: Poor                              Cognition Arousal/Alertness: Awake/alert Behavior During Therapy: WFL for tasks assessed/performed Overall Cognitive Status: No family/caregiver present to determine baseline cognitive functioning                                 General Comments: pt engaged conversationally, with comedic input;pt appeared motivated to participate during session       Exercises      General Comments        Pertinent Vitals/Pain Pain Assessment: Faces Faces Pain Scale: Hurts little more Pain Location: B knees Pain Descriptors / Indicators: Grimacing Pain Intervention(s): Limited activity within patient's tolerance;Monitored during session    Home Living  Prior Function            PT Goals (current goals can now be found in the care plan section) Acute Rehab PT Goals Patient Stated Goal: "run out of here" PT Goal Formulation: Patient unable to participate in goal setting Time For Goal Achievement: 04/30/18 Potential to Achieve Goals: Fair Progress towards PT goals: Progressing toward  goals    Frequency    Min 2X/week      PT Plan Current plan remains appropriate    Co-evaluation              AM-PAC PT "6 Clicks" Mobility   Outcome Measure  Help needed turning from your back to your side while in a flat bed without using bedrails?: A Little Help needed moving from lying on your back to sitting on the side of a flat bed without using bedrails?: A Lot Help needed moving to and from a bed to a chair (including a wheelchair)?: A Lot Help needed standing up from a chair using your arms (e.g., wheelchair or bedside chair)?: A Lot Help needed to walk in hospital room?: A Lot Help needed climbing 3-5 steps with a railing? : Total 6 Click Score: 12    End of Session Equipment Utilized During Treatment: Oxygen Activity Tolerance: Patient limited by fatigue Patient left: in bed;with bed alarm set;with restraints reapplied Nurse Communication: Mobility status PT Visit Diagnosis: Muscle weakness (generalized) (M62.81);Other abnormalities of gait and mobility (R26.89)     Time: 2800-3491 PT Time Calculation (min) (ACUTE ONLY): 28 min  Charges:  $Therapeutic Activity: 8-22 mins                     Reinaldo Berber, PT, DPT Acute Rehabilitation Services Pager: [phone] Office: [phone]    Reinaldo Berber 04/28/2018, 2:53 PM

## 2018-04-28 NOTE — Progress Notes (Addendum)
CSW following for discharge plan. CSW attempted to coordinate patient's return back to SNF today, but received a call from the patient's legal guardian that the patient's facility was attempting to transfer him due to them not having any beds available, and the legal guardian was not ok with a transfer of the patient to another SNF. CSW contacted [facility] admissions, and they confirmed that there were no male beds available in [place] but they were planning on placing him in [place] instead until a bed opened up. CSW explained to [facility] admissions that the legal guardian was not providing permission for that, and they were able to rearrange their rooms to have a bed available for the patient tomorrow. CSW updated MD and RN, and contacted legal guardian to inform her that the patient will transition back to [facility] tomorrow.  Pending no medical issues, patient can be transported in the morning at 11 to [facility]. CSW to follow.  Laveda Abbe, [place] Clinical Social Worker [phone]

## 2018-04-28 NOTE — Care Management Important Message (Signed)
Important Message  Patient Details  Name: Anthony [name] MRN: [id number] Date of Birth: 1941/01/21   Medicare Important Message Given:  Yes    Zenon Mayo, RN 04/28/2018, 1:16 PM

## 2018-04-28 NOTE — Progress Notes (Signed)
MD office called and made aware that there is no bed available for pt at [facility] today and maybe will be able to go tomorrow.

## 2018-04-28 NOTE — Progress Notes (Signed)
Occupational Therapy Treatment Patient Details Name: Anthony [name] MRN: [id number] DOB: 1941/03/09 Today's Date: 04/28/2018    History of present illness 78 yo male admitted for management of squamous cell carcinoma of lip; now s/p total lower lip resection, tracheostomy, Reconstruction of lower lip defect, bilateral radical neck dissection due to enlarged nodes in both superior necks;  has a pertinent past medical history of Acute pyelonephritis (12/23/2014), Arthritis, Asthma, BPH (benign prostatic hyperplasia), Cancer ([place]), Chronic back pain, Chronic hip pain, CKD (chronic kidney disease), COPD (chronic obstructive pulmonary disease) ([place]), Dementia ([place]), Depression, Dysphagia, GERD (gastroesophageal reflux disease), Headache, History of kidney stones, Hypertension, Kidney stone, Neuropathy, Pneumonia, PTSD (post-traumatic stress disorder), Pulmonary nodules (12/25/2014), and Restless leg syndrome.   OT comments  Pt progressing toward established goals. Pt currently requires modA +1/+2 for physical assist/line management for simulated toilet transfer and setupA to don socks. Pt on RA during transfer, VSS throughout session. Pt is highly motivated to participate during sessions and will continue to benefit from skilled OT services at SNF. Will continue to follow acutely.    Follow Up Recommendations  SNF    Equipment Recommendations  None recommended by OT    Recommendations for Other Services      Precautions / Restrictions Precautions Precautions: Fall;Other (comment) Precaution Comments: Trach, NG tube Restrictions Weight Bearing Restrictions: No       Mobility Bed Mobility Overal bed mobility: Needs Assistance Bed Mobility: Supine to Sit   Sidelying to sit: HOB elevated;Supervision       General bed mobility comments: able to come to sitting without physical assitance   Transfers Overall transfer level: Needs assistance Equipment used: Rolling walker (2 wheeled);2  person hand held assist Transfers: Sit to/from [employer] Transfers Sit to Stand: +2 physical assistance;+2 safety/equipment;Min assist Stand pivot transfers: +2 physical assistance;+2 safety/equipment;Max assist;Min assist      Lateral/Scoot Transfers: Mod assist;+2 physical assistance General transfer comment: Patient performing stand pivot transfers with decreased assistance today, mod A of 1, 2 for line mgt.     Balance Overall balance assessment: Needs assistance Sitting-balance support: Feet supported Sitting balance-Leahy Scale: Fair     Standing balance support: Bilateral upper extremity supported;During functional activity Standing balance-Leahy Scale: Poor                             ADL either performed or assessed with clinical judgement   ADL Overall ADL's : Needs assistance/impaired                     Lower Body Dressing: Set up;Sitting/lateral leans Lower Body Dressing Details (indicate cue type and reason): pt donned socks while sitting EOB  Toilet Transfer: Moderate assistance;+2 for physical assistance;+2 for safety/equipment;Maximal assistance;Requires drop arm Toilet Transfer Details (indicate cue type and reason): simulated transfer from EOB to recliner with lateral scoot;pt required modA initially progressing to maxA          Functional mobility during ADLs: +2 for physical assistance;+2 for safety/equipment;Rolling walker;Moderate assistance General ADL Comments: pt completed lateral scoot with +22modA to go from drop arm recliner to EOB     Vision       Perception     Praxis      Cognition Arousal/Alertness: Awake/alert Behavior During Therapy: WFL for tasks assessed/performed Overall Cognitive Status: Within Functional Limits for tasks assessed  General Comments: pt engaged conversationally, with comedic input;pt appeared motivated to participate during session          Exercises     Shoulder Instructions       General Comments pt on RA during transfer with use of PSMV;VSS throughout session    Pertinent Vitals/ Pain       Pain Assessment: No/denies pain Faces Pain Scale: Hurts little more Pain Location: B knees Pain Descriptors / Indicators: Grimacing Pain Intervention(s): Limited activity within patient's tolerance;Monitored during session  Home Living                                          Prior Functioning/Environment              Frequency  Min 2X/week        Progress Toward Goals  OT Goals(current goals can now be found in the care plan section)  Progress towards OT goals: Progressing toward goals  Acute Rehab OT Goals Patient Stated Goal: to eat OT Goal Formulation: With patient Time For Goal Achievement: 05/04/18 Potential to Achieve Goals: Good ADL Goals Pt Will Perform Upper Body Bathing: with min assist;sitting Pt Will Perform Lower Body Bathing: with mod assist;sit to/from stand Pt Will Perform Upper Body Dressing: with min assist;sitting Pt Will Perform Lower Body Dressing: with mod assist;sit to/from stand Pt Will Transfer to Toilet: ambulating;with min assist Pt Will Perform Toileting - Clothing Manipulation and hygiene: with min assist;sit to/from stand  Plan Discharge plan remains appropriate    Co-evaluation    PT/OT/SLP Co-Evaluation/Treatment: Yes Reason for Co-Treatment: Complexity of the patient's impairments (multi-system involvement);For patient/therapist safety;To address functional/ADL transfers PT goals addressed during session: Mobility/safety with mobility;Strengthening/ROM OT goals addressed during session: ADL's and self-care      AM-PAC OT "6 Clicks" Daily Activity     Outcome Measure   Help from another person eating meals?: Total Help from another person taking care of personal grooming?: A Little Help from another person toileting, which includes using toliet,  bedpan, or urinal?: A Lot Help from another person bathing (including washing, rinsing, drying)?: A Lot Help from another person to put on and taking off regular upper body clothing?: A Little Help from another person to put on and taking off regular lower body clothing?: A Little 6 Click Score: 14    End of Session Equipment Utilized During Treatment: Oxygen  OT Visit Diagnosis: Unsteadiness on feet (R26.81);Muscle weakness (generalized) (M62.81);Pain   Activity Tolerance Patient tolerated treatment well   Patient Left in chair;with call bell/phone within reach;with chair alarm set   Nurse Communication Mobility status        Time: 9629-5284 OT Time Calculation (min): 28 min  Charges: OT General Charges $OT Visit: 1 Visit OT Treatments $Self Care/Home Management : 8-22 mins  Dorinda Hill OTR/L Acute Rehabilitation Services Office: [place] 04/28/2018, 3:17 PM

## 2018-04-28 NOTE — Progress Notes (Signed)
PROGRESS NOTE    Anthony [name]  HYQ:[id number] DOB: 08-Jun-1940 DOA: 04/12/2018 PCP: Rosita Fire, MD     Brief Narrative:  Anthony [name] is a 78 year old with past medical history relevant for dementia, COPD, depression/anxiety, chronic pain, stage III CKD, chronic bladder outlet obstruction status post suprapubic Foley catheter placement admitted on 04/12/2018 for resection of squamous cell carcinoma of the lower lip and bilateral selective neck dissections and reconstruction and placement of tracheostomy tube, placement of G-tube on 04/21/2018.  Internal medicine was consulted for a brief episode of NSVT as well as low-grade temperature and concern for possible UTI.  New events last 24 hours / Subjective: Asking for cup of coffee. He is currently NPO and getting nutrition thru Gtube. No other issues.   Stable for discharge from my perspective to SNF.    Assessment & Plan:   Active Problems:   Tracheostomy in place [facility])   Cancer of lower lip   Protein-calorie malnutrition, severe   NSVT -On 2/4. Asymptomatic at that time -Echo 09/2017 demonstrated a preserved EF and no regional wall motion abnormalities -Rate controlling med held due to marginal BP  -Normal sinus rhythm currently.  Telemetry reviewed personally this morning   Squamous cell carcinoma of lower lip status post resection, bilateral neck dissection, scalp tumor, tracheostomy placement -Per ENT and plastic surgery.  Plan to improve nutritional status, plan for OR in several weeks  Protein calorie malnutrition -Status post PEG tube placement, continue tube feeding  Chronic kidney disease stage III -Baseline creatinine around 1.4-1.7  Chronic bladder outlet obstruction  -Status post suprapubic catheter  Dementia, depression, anxiety -Continue namenda, sertraline, gabapentin, Wellbutrin -Has required restraints intermittently due to agitation, pulling out his trach. Mentation has remained stable > 24 hours, has not  required restraints > 24 hours. Alert and oriented this morning.   Low grade fever -2/4-2/5. No source of infection found, received Bactrim during work up for UTI. Now afebrile and off antibiotics   Lung nodule 6 mm left lower lobe -CT of the chest was performed, repeat CT scan of the chest recommended in 6 to 12 months interval.  Probably between July 2020 - December 2020   Antimicrobials:  Anti-infectives (From admission, onward)   Start     Dose/Rate Route Frequency Ordered Stop   04/20/18 2200  sulfamethoxazole-trimethoprim (BACTRIM) 160 mg in dextrose 5 % 250 mL IVPB  Status:  Discontinued     160 mg 260 mL/hr over 60 Minutes Intravenous Every 12 hours 04/20/18 1217 04/25/18 1621   04/20/18 1000  ciprofloxacin (CIPRO) IVPB 400 mg     400 mg 200 mL/hr over 60 Minutes Intravenous To Surgery 04/19/18 1948 04/20/18 1512   04/18/18 2200  sulfamethoxazole-trimethoprim (BACTRIM) 363.04 mg of trimethoprim in dextrose 5 % 500 mL IVPB  Status:  Discontinued     10 mg/kg/day of trimethoprim  72.6 kg 348.5 mL/hr over 90 Minutes Intravenous Every 12 hours 04/18/18 1711 04/20/18 1217   04/17/18 2200  sulfamethoxazole-trimethoprim (BACTRIM,SEPTRA) 200-40 MG/5ML suspension 20 mL  Status:  Discontinued     20 mL Per Tube Every 12 hours 04/17/18 1844 04/18/18 1711   04/12/18 1000  ciprofloxacin (CIPRO) IVPB 400 mg     400 mg 200 mL/hr over 60 Minutes Intravenous On call to O.R. 04/12/18 0844 04/12/18 1039       Objective: Vitals:   04/27/18 2300 04/28/18 0123 04/28/18 0339 04/28/18 0825  BP: (!) 99/48 (!) 87/42 (!) 98/40 (!) 99/56  Pulse: 64 70 (!)  57 (!) 56  Resp: 18 17 18 12   Temp: 97.9 F (36.6 C)  97.8 F (36.6 C) 98.3 F (36.8 C)  TempSrc: Axillary  Axillary Oral  SpO2: 100% 99% 100% 97%  Weight:      Height:        Intake/Output Summary (Last 24 hours) at 04/28/2018 0850 Last data filed at 04/28/2018 0600 Gross per 24 hour  Intake 3650 ml  Output 750 ml  Net 2900 ml    Filed Weights   04/25/18 0500 04/26/18 0537 04/27/18 0436  Weight: 66.6 kg 69.5 kg 65.8 kg   Examination: General exam: Appears calm and comfortable  Respiratory system: Clear to auscultation. Respiratory effort normal. Cardiovascular system: S1 & S2 heard, RRR. No JVD, murmurs, rubs, gallops or clicks. No pedal edema. Gastrointestinal system: Abdomen is nondistended, soft and nontender. G tube in place  Central nervous system: Alert and oriented. Extremities: Symmetric  Psychiatry: Judgement and insight appear normal. Mood & affect appropriate.    Data Reviewed: I have personally reviewed following labs and imaging studies  CBC: Recent Labs  Lab 04/22/18 0251 04/26/18 0248  WBC 6.9 8.5  NEUTROABS 5.1  --   HGB 8.6* 9.0*  HCT 27.5* 29.0*  MCV 89.6 88.4  PLT 265 532   Basic Metabolic Panel: Recent Labs  Lab 04/22/18 0251 04/26/18 0248  NA 138 136  K 4.4 4.3  CL 109 103  CO2 24 27  GLUCOSE 148* 143*  BUN 15 23  CREATININE 1.52* 1.57*  CALCIUM 7.9* 8.1*  MG 2.1 2.2  PHOS 3.6  --    GFR: Estimated Creatinine Clearance: 35.5 mL/min (A) (by C-G formula based on SCr of 1.57 mg/dL (H)). Liver Function Tests: No results for input(s): AST, ALT, ALKPHOS, BILITOT, PROT, ALBUMIN in the last 168 hours. No results for input(s): LIPASE, AMYLASE in the last 168 hours. No results for input(s): AMMONIA in the last 168 hours. Coagulation Profile: No results for input(s): INR, PROTIME in the last 168 hours. Cardiac Enzymes: No results for input(s): CKTOTAL, CKMB, CKMBINDEX, TROPONINI in the last 168 hours. BNP (last 3 results) No results for input(s): PROBNP in the last 8760 hours. HbA1C: No results for input(s): HGBA1C in the last 72 hours. CBG: Recent Labs  Lab 04/27/18 0434 04/27/18 0736 04/27/18 1212 04/27/18 1813 04/27/18 1944  GLUCAP 133* 133* 107* 127* 127*   Lipid Profile: No results for input(s): CHOL, HDL, LDLCALC, TRIG, CHOLHDL, LDLDIRECT in the last 72  hours. Thyroid Function Tests: No results for input(s): TSH, T4TOTAL, FREET4, T3FREE, THYROIDAB in the last 72 hours. Anemia Panel: No results for input(s): VITAMINB12, FOLATE, FERRITIN, TIBC, IRON, RETICCTPCT in the last 72 hours. Sepsis Labs: No results for input(s): PROCALCITON, LATICACIDVEN in the last 168 hours.  No results found for this or any previous visit (from the past 240 hour(s)).     Radiology Studies: No results found.    Scheduled Meds: . buPROPion  100 mg Oral Daily  . chlorhexidine  15 mL Mouth Rinse BID  . free water  200 mL Per Tube Q8H  . gabapentin  200 mg Oral TID  . insulin aspart  0-15 Units Subcutaneous TID WC  . insulin aspart  0-5 Units Subcutaneous QHS  . mouth rinse  15 mL Mouth Rinse q12n4p  . memantine  5 mg Oral Daily  . pantoprazole sodium  40 mg Per Tube BID  . sertraline  50 mg Oral Daily  . white petrolatum   Topical BID  Continuous Infusions: . feeding supplement (OSMOLITE 1.2 CAL) 1,000 mL (04/26/18 2346)  . lactated ringers 10 mL/hr at 04/26/18 2103     LOS: 16 days    Time spent: 20 minutes   Dessa Phi, DO [facility] [email or web address] 04/28/2018, 8:50 AM

## 2018-04-28 NOTE — Discharge Summary (Addendum)
Physician Discharge Summary  Patient ID: Anthony [name] MRN: [id number] DOB/AGE: [birth year] 78 y.o.  Admit date: 04/12/2018 Discharge date: 04/29/2018  Admission Diagnoses: Lower lip cancer, scalp cancer, malnutrition  Discharge Diagnoses:  Active Problems:   Tracheostomy in place [facility])   Cancer of lower lip   Protein-calorie malnutrition, severe Scalp cancer  Discharged Condition: good  Hospital Course: 78 year old male presented to the hospital for management of a large lower lip cancer.  See operative notes.  A tracheostomy tube was placed at the time of surgery.  He was initially observed overnight in ICU and did well.  He was then transferred to progressive care where he remained through his hospitalization.  Initially, he was given nutrition via NG tube.  He worked with physical and occupational therapy.  He had low grade fever for a few days after surgery and was found to have evidence of UTI and was treated with Bactrim.  His trach tube was changed on POD 5 to a cuffless tube.  He had a short, isolated, asymptomatic run of V tach and the hospitalist was consulted and helped with his care.  His lower lip reconstruction broke down and his NG tube came out around that time.  He was taken back to surgery for revision of his lower lip by Dr. Marla Roe along with resection of the scalp cancer with reconstruction.  On the following day, a G-tube was placed by Dr. Kieth Brightly of general surgery and nutrition reinitiated thereafter.  His lower lip reconstruction again broke down and Dr. Marla Roe planned to allow him to improve with nutrition before trying another closure in a couple of weeks.  He was felt stable for discharge receiving medicines and nutrition via G-tube and cuffless trach tube in place.  Consults: Plastic surgery, general surgery, hospitalist, physical therapy, occupational therapy, speech pathology  Significant Diagnostic Studies: None  Treatments: surgery: Lower lip resection  with reconstruction, tracheostomy, and bilateral selective neck dissections.  Revision lower lip reconstruction, scalp cancer resection and reconstruction.  Laparoscopic G-tube placement.  Discharge Exam: Blood pressure (!) 98/55, pulse 67, temperature 98.3 F (36.8 C), temperature source Oral, resp. rate 10, height 6\' 3"  (1.905 m), weight 65.8 kg, SpO2 98 %. General appearance: alert, cooperative and no distress Head: Scalp with dressing in place.  Lower lip wound open with flap of tissue to right. Neck: Neck incision healing well.  Trach site stable with #6 cuffless [place] in place.  Disposition: Discharge disposition: 03-Skilled Nursing Facility       Discharge Instructions    Diet - low sodium heart healthy   Complete by:  As directed    Discharge instructions   Complete by:  As directed    Apply Bacitracin ointment to lip wound twice daily.  Apply KY jelly to scalp wound once daily.  Routine trach and G-tube management.   Increase activity slowly   Complete by:  As directed      Allergies as of 04/28/2018      Reactions   Bee Venom Anaphylaxis   Penicillins Hives   Has patient had a PCN reaction causing immediate rash, facial/tongue/throat swelling, SOB or lightheadedness with hypotension: NO Has patient had a PCN reaction causing severe rash involving mucus membranes or skin necrosis: NO Has patient had a PCN reaction that required hospitalization: NO Has patient had a PCN reaction occurring within the last 10 years: NO If all of the above answers are "NO", then may proceed with Cephalosporin use.   Strawberry Extract  Rash      Medication List    STOP taking these medications   acetaminophen 500 MG tablet Commonly known as:  TYLENOL   aspirin EC 81 MG tablet   fluticasone 50 MCG/ACT nasal spray Commonly known as:  FLONASE   ipratropium 0.02 % nebulizer solution Commonly known as:  ATROVENT   Lidocaine 2 % Gel   multivitamin with minerals Tabs tablet    nystatin powder Commonly known as:  MYCOSTATIN/NYSTOP   oxyCODONE 5 MG immediate release tablet Commonly known as:  Oxy IR/ROXICODONE Replaced by:  oxyCODONE 5 MG/5ML solution   oyster calcium 500 MG Tabs tablet   pantoprazole 40 MG tablet Commonly known as:  PROTONIX   traMADol 50 MG tablet Commonly known as:  ULTRAM   vitamin C 500 MG tablet Commonly known as:  ASCORBIC ACID   Vitamin D (Ergocalciferol) 1.25 MG (50000 UT) Caps capsule Commonly known as:  DRISDOL     TAKE these medications   albuterol 108 (90 Base) MCG/ACT inhaler Commonly known as:  PROVENTIL HFA;VENTOLIN HFA Inhale 2 puffs into the lungs every 6 (six) hours as needed for wheezing or shortness of breath.   buPROPion 100 MG tablet Commonly known as:  WELLBUTRIN Take 100 mg by mouth daily. (0900)   famotidine 10 MG tablet Commonly known as:  PEPCID Take 40 mg by mouth daily. (0900)   feeding supplement (OSMOLITE 1.2 CAL) Liqd Place 1,000 mLs into feeding tube continuous. What changed:    how much to take  how to take this  when to take this   free water Soln Place 200 mLs into feeding tube every 8 (eight) hours.   gabapentin 100 MG capsule Commonly known as:  NEURONTIN Take 200 mg by mouth 3 (three) times daily. (0900, 1700, & 2100)   memantine 5 MG tablet Commonly known as:  NAMENDA Take 1 tablet (5 mg total) by mouth daily. (0900)   oxyCODONE 5 MG/5ML solution Commonly known as:  ROXICODONE Place 5 mLs (5 mg total) into feeding tube every 6 (six) hours as needed for moderate pain. Replaces:  oxyCODONE 5 MG immediate release tablet   polyethylene glycol packet Commonly known as:  MIRALAX Take 17 g by mouth daily. What changed:  when to take this   pramipexole 0.125 MG tablet Commonly known as:  MIRAPEX Take 0.125 mg by mouth at bedtime. (2100)   sennosides-docusate sodium 8.6-50 MG tablet Commonly known as:  SENOKOT-S Take 1 tablet by mouth daily as needed for constipation.    sertraline 50 MG tablet Commonly known as:  ZOLOFT Take 50 mg by mouth daily. (0900)   white petrolatum Oint Commonly known as:  VASELINE Apply 1 application topically 2 (two) times daily.      Follow-up Information    Melida Quitter, MD In 1 week.   Specialty:  Otolaryngology Contact information: [address] [place] [address] [phone]        Wallace Going, DO In 1 week.   Specialty:  Plastic Surgery Contact information: [address] [place] [address] [phone]        Kinsinger, Arta Bruce, MD Follow up in 6 week(s).   Specialty:  General Surgery Contact information: [place]  [address] [phone]           SignedMelida Quitter 04/28/2018, 12:44 PM

## 2018-04-29 DIAGNOSIS — R109 Unspecified abdominal pain: Secondary | ICD-10-CM | POA: Diagnosis not present

## 2018-04-29 DIAGNOSIS — R1311 Dysphagia, oral phase: Secondary | ICD-10-CM | POA: Diagnosis not present

## 2018-04-29 DIAGNOSIS — R1314 Dysphagia, pharyngoesophageal phase: Secondary | ICD-10-CM | POA: Diagnosis not present

## 2018-04-29 DIAGNOSIS — K922 Gastrointestinal hemorrhage, unspecified: Secondary | ICD-10-CM | POA: Diagnosis not present

## 2018-04-29 DIAGNOSIS — F4312 Post-traumatic stress disorder, chronic: Secondary | ICD-10-CM | POA: Diagnosis not present

## 2018-04-29 DIAGNOSIS — G990 Autonomic neuropathy in diseases classified elsewhere: Secondary | ICD-10-CM | POA: Diagnosis not present

## 2018-04-29 DIAGNOSIS — J449 Chronic obstructive pulmonary disease, unspecified: Secondary | ICD-10-CM | POA: Diagnosis present

## 2018-04-29 DIAGNOSIS — F331 Major depressive disorder, recurrent, moderate: Secondary | ICD-10-CM | POA: Diagnosis not present

## 2018-04-29 DIAGNOSIS — F339 Major depressive disorder, recurrent, unspecified: Secondary | ICD-10-CM | POA: Diagnosis not present

## 2018-04-29 DIAGNOSIS — Z930 Tracheostomy status: Secondary | ICD-10-CM | POA: Diagnosis not present

## 2018-04-29 DIAGNOSIS — Z880 Allergy status to penicillin: Secondary | ICD-10-CM | POA: Diagnosis not present

## 2018-04-29 DIAGNOSIS — Z87891 Personal history of nicotine dependence: Secondary | ICD-10-CM | POA: Diagnosis not present

## 2018-04-29 DIAGNOSIS — R918 Other nonspecific abnormal finding of lung field: Secondary | ICD-10-CM | POA: Diagnosis not present

## 2018-04-29 DIAGNOSIS — G2581 Restless legs syndrome: Secondary | ICD-10-CM | POA: Diagnosis not present

## 2018-04-29 DIAGNOSIS — F418 Other specified anxiety disorders: Secondary | ICD-10-CM | POA: Diagnosis present

## 2018-04-29 DIAGNOSIS — M2550 Pain in unspecified joint: Secondary | ICD-10-CM | POA: Diagnosis not present

## 2018-04-29 DIAGNOSIS — I10 Essential (primary) hypertension: Secondary | ICD-10-CM | POA: Diagnosis not present

## 2018-04-29 DIAGNOSIS — L989 Disorder of the skin and subcutaneous tissue, unspecified: Secondary | ICD-10-CM | POA: Diagnosis present

## 2018-04-29 DIAGNOSIS — R52 Pain, unspecified: Secondary | ICD-10-CM | POA: Diagnosis not present

## 2018-04-29 DIAGNOSIS — Z85819 Personal history of malignant neoplasm of unspecified site of lip, oral cavity, and pharynx: Secondary | ICD-10-CM | POA: Diagnosis not present

## 2018-04-29 DIAGNOSIS — L03311 Cellulitis of abdominal wall: Secondary | ICD-10-CM | POA: Diagnosis not present

## 2018-04-29 DIAGNOSIS — Z430 Encounter for attention to tracheostomy: Secondary | ICD-10-CM | POA: Diagnosis not present

## 2018-04-29 DIAGNOSIS — B370 Candidal stomatitis: Secondary | ICD-10-CM | POA: Diagnosis not present

## 2018-04-29 DIAGNOSIS — N320 Bladder-neck obstruction: Secondary | ICD-10-CM | POA: Diagnosis not present

## 2018-04-29 DIAGNOSIS — R498 Other voice and resonance disorders: Secondary | ICD-10-CM | POA: Diagnosis not present

## 2018-04-29 DIAGNOSIS — F17211 Nicotine dependence, cigarettes, in remission: Secondary | ICD-10-CM | POA: Diagnosis not present

## 2018-04-29 DIAGNOSIS — Z91018 Allergy to other foods: Secondary | ICD-10-CM | POA: Diagnosis not present

## 2018-04-29 DIAGNOSIS — N390 Urinary tract infection, site not specified: Secondary | ICD-10-CM | POA: Diagnosis present

## 2018-04-29 DIAGNOSIS — J984 Other disorders of lung: Secondary | ICD-10-CM | POA: Diagnosis not present

## 2018-04-29 DIAGNOSIS — Z931 Gastrostomy status: Secondary | ICD-10-CM | POA: Diagnosis not present

## 2018-04-29 DIAGNOSIS — L03211 Cellulitis of face: Secondary | ICD-10-CM | POA: Diagnosis not present

## 2018-04-29 DIAGNOSIS — C4440 Unspecified malignant neoplasm of skin of scalp and neck: Secondary | ICD-10-CM | POA: Diagnosis not present

## 2018-04-29 DIAGNOSIS — D649 Anemia, unspecified: Secondary | ICD-10-CM | POA: Diagnosis present

## 2018-04-29 DIAGNOSIS — K9423 Gastrostomy malfunction: Secondary | ICD-10-CM | POA: Diagnosis not present

## 2018-04-29 DIAGNOSIS — C4402 Squamous cell carcinoma of skin of lip: Secondary | ICD-10-CM | POA: Diagnosis not present

## 2018-04-29 DIAGNOSIS — F419 Anxiety disorder, unspecified: Secondary | ICD-10-CM | POA: Diagnosis not present

## 2018-04-29 DIAGNOSIS — J690 Pneumonitis due to inhalation of food and vomit: Secondary | ICD-10-CM | POA: Diagnosis not present

## 2018-04-29 DIAGNOSIS — F0150 Vascular dementia without behavioral disturbance: Secondary | ICD-10-CM | POA: Diagnosis not present

## 2018-04-29 DIAGNOSIS — T83010A Breakdown (mechanical) of cystostomy catheter, initial encounter: Secondary | ICD-10-CM | POA: Diagnosis not present

## 2018-04-29 DIAGNOSIS — Z431 Encounter for attention to gastrostomy: Secondary | ICD-10-CM | POA: Diagnosis not present

## 2018-04-29 DIAGNOSIS — E43 Unspecified severe protein-calorie malnutrition: Secondary | ICD-10-CM | POA: Diagnosis not present

## 2018-04-29 DIAGNOSIS — N183 Chronic kidney disease, stage 3 (moderate): Secondary | ICD-10-CM | POA: Diagnosis not present

## 2018-04-29 DIAGNOSIS — R1110 Vomiting, unspecified: Secondary | ICD-10-CM | POA: Diagnosis present

## 2018-04-29 DIAGNOSIS — F0390 Unspecified dementia without behavioral disturbance: Secondary | ICD-10-CM | POA: Diagnosis present

## 2018-04-29 DIAGNOSIS — T83518A Infection and inflammatory reaction due to other urinary catheter, initial encounter: Secondary | ICD-10-CM | POA: Diagnosis present

## 2018-04-29 DIAGNOSIS — M7521 Bicipital tendinitis, right shoulder: Secondary | ICD-10-CM | POA: Diagnosis not present

## 2018-04-29 DIAGNOSIS — I959 Hypotension, unspecified: Secondary | ICD-10-CM | POA: Diagnosis not present

## 2018-04-29 DIAGNOSIS — R1312 Dysphagia, oropharyngeal phase: Secondary | ICD-10-CM | POA: Diagnosis not present

## 2018-04-29 DIAGNOSIS — Z7401 Bed confinement status: Secondary | ICD-10-CM | POA: Diagnosis not present

## 2018-04-29 DIAGNOSIS — Z48817 Encounter for surgical aftercare following surgery on the skin and subcutaneous tissue: Secondary | ICD-10-CM | POA: Diagnosis not present

## 2018-04-29 DIAGNOSIS — J45998 Other asthma: Secondary | ICD-10-CM | POA: Diagnosis not present

## 2018-04-29 DIAGNOSIS — R509 Fever, unspecified: Secondary | ICD-10-CM | POA: Diagnosis not present

## 2018-04-29 DIAGNOSIS — R41841 Cognitive communication deficit: Secondary | ICD-10-CM | POA: Diagnosis not present

## 2018-04-29 DIAGNOSIS — C770 Secondary and unspecified malignant neoplasm of lymph nodes of head, face and neck: Secondary | ICD-10-CM | POA: Diagnosis present

## 2018-04-29 DIAGNOSIS — N3001 Acute cystitis with hematuria: Secondary | ICD-10-CM | POA: Diagnosis not present

## 2018-04-29 DIAGNOSIS — Z91030 Bee allergy status: Secondary | ICD-10-CM | POA: Diagnosis not present

## 2018-04-29 DIAGNOSIS — N400 Enlarged prostate without lower urinary tract symptoms: Secondary | ICD-10-CM | POA: Diagnosis present

## 2018-04-29 DIAGNOSIS — N139 Obstructive and reflux uropathy, unspecified: Secondary | ICD-10-CM | POA: Diagnosis not present

## 2018-04-29 DIAGNOSIS — E46 Unspecified protein-calorie malnutrition: Secondary | ICD-10-CM | POA: Diagnosis not present

## 2018-04-29 DIAGNOSIS — R278 Other lack of coordination: Secondary | ICD-10-CM | POA: Diagnosis not present

## 2018-04-29 DIAGNOSIS — M6281 Muscle weakness (generalized): Secondary | ICD-10-CM | POA: Diagnosis not present

## 2018-04-29 DIAGNOSIS — N179 Acute kidney failure, unspecified: Secondary | ICD-10-CM | POA: Diagnosis not present

## 2018-04-29 DIAGNOSIS — N401 Enlarged prostate with lower urinary tract symptoms: Secondary | ICD-10-CM | POA: Diagnosis not present

## 2018-04-29 DIAGNOSIS — C001 Malignant neoplasm of external lower lip: Secondary | ICD-10-CM | POA: Diagnosis not present

## 2018-04-29 LAB — GLUCOSE, CAPILLARY
GLUCOSE-CAPILLARY: 109 mg/dL — AB (ref 70–99)
Glucose-Capillary: 138 mg/dL — ABNORMAL HIGH (ref 70–99)

## 2018-04-29 NOTE — Progress Notes (Signed)
Patient will DC to: [facility] Anticipated DC date: 04/29/2018 Family notified: Yes Transport by: Corey Harold   Per MD patient ready for DC to . RN, patient, patient's family, and facility notified of DC. Discharge Summary and FL2 sent to facility. RN to call report prior to discharge [phone]). DC packet on chart. Ambulance transport requested for patient.   CSW will sign off for now as social work intervention is no longer needed. Please consult [place] again if new needs arise.  Mieke Brinley, LCSW-A [facility]/Clinical Social Work Department Cell: [phone]

## 2018-04-29 NOTE — Progress Notes (Signed)
   Note plans to discharge to SNF today. Telemetry reviewed, showed normal sinus rhythm with some PACs. Patient without acute issues otherwise. Ready for DC once SNF bed available.    Dessa Phi, DO [facility] [email or web address] 04/29/2018, 10:08 AM

## 2018-04-29 NOTE — NC FL2 (Signed)
[place] MEDICAID FL2 LEVEL OF CARE SCREENING TOOL     IDENTIFICATION  Patient Name: Anthony [name] Birthdate: 1940/04/23 Sex: male Admission Date (Current Location): 04/12/2018  [facility] and [place] Number:  Herbalist and Address:  [facility], [place] [address] [place],  [address]      Provider Number: [id number]  Attending Physician Name and Address:  Melida Quitter, MD  Relative Name and Phone Number:  Lavell Islam, legal guardian, [phone]    Current Level of Care: Hospital Recommended Level of Care: [place] Prior Approval Number: [id number]  Date Approved/Denied: 11/09/17 PASRR Number:    Discharge Plan: SNF    Current Diagnoses: Patient Active Problem List   Diagnosis Date Noted  . Protein-calorie malnutrition, severe 04/13/2018  . Tracheostomy in place [facility]) 04/12/2018  . Cancer of lower lip 04/12/2018  . Squamous cell carcinoma of lip 03/21/2018  . Cellulitis 11/07/2017  . Syncope and collapse 10/10/2017  . Acute lower UTI 10/10/2017  . AKI (acute kidney injury) ([place])   . Hematemesis 07/06/2017  . Normochromic normocytic anemia 06/19/2017  . CKD (chronic kidney disease) stage 3, GFR 30-59 ml/min (HCC) 06/19/2017  . Dementia ([place]) 06/19/2017  . Decubitus ulcer 06/19/2017  . Open wnd of scalp   . S/p left hip fracture   . HCAP (healthcare-associated pneumonia) 10/31/2016  . CKD (chronic kidney disease) 10/31/2016  . COPD (chronic obstructive pulmonary disease) ([place]) 10/31/2016  . Hyponatremia 12/25/2014  . Hypokalemia 12/25/2014  . Normocytic anemia 12/25/2014  . Pulmonary nodules 12/25/2014  . Tobacco abuse 12/25/2014  . ARF (acute renal failure) ([place]) 12/24/2014  . Acute pyelonephritis 12/23/2014    Orientation RESPIRATION BLADDER Height & Weight     Self, Place, Situation  Tracheostomy(Sp02 99) Continent, External catheter(subprpubic cath) Weight: 145 lb 1 oz (65.8 kg) Height:  6\' 3"   (190.5 cm)  BEHAVIORAL SYMPTOMS/MOOD NEUROLOGICAL BOWEL NUTRITION STATUS      Continent Feeding tube(tube/continuos feed; osmolite 1.2 cal)  AMBULATORY STATUS COMMUNICATION OF NEEDS Skin   Extensive Assist   Surgical wounds(incision on head, left/right ankle, abdomen. All are closed and all treated with guaze)                       Personal Care Assistance Level of Assistance  Bathing, Feeding, Dressing, Total care Bathing Assistance: Limited assistance Feeding assistance: Maximum assistance Dressing Assistance: Limited assistance Total Care Assistance: Maximum assistance   Functional Limitations Info  Sight, Hearing, Speech Sight Info: Adequate Hearing Info: Adequate Speech Info: Adequate    SPECIAL CARE FACTORS FREQUENCY  PT (By licensed PT), OT (By licensed OT), Speech therapy     PT Frequency: 5x/wk OT Frequency: 5x/wk     Speech Therapy Frequency: 3x/wk      Contractures Contractures Info: Not present    Additional Factors Info  Code Status, Allergies Code Status Info: Full Code Allergies Info:  Bee Venom, Penicillins, Strawberry Extract           Current Medications (04/29/2018):  This is the current hospital active medication list Current Facility-Administered Medications  Medication Dose Route Frequency Provider Last Rate Last Dose  . buPROPion [facility]) tablet 100 mg  100 mg Oral Daily Purohit, Shrey C, MD   100 mg at 04/29/18 0945  . chlorhexidine (PERIDEX) 0.12 % solution 15 mL  15 mL Mouth Rinse BID Kinsinger, Arta Bruce, MD   15 mL at 04/29/18 0945  . feeding supplement (OSMOLITE 1.2 CAL)  liquid 1,000 mL  1,000 mL Per Tube Continuous Swayze, Ava, DO 75 mL/hr at 04/28/18 1500    . free water 200 mL  200 mL Per Tube Q8H Melida Quitter, MD   200 mL at 04/29/18 0557  . gabapentin (NEURONTIN) capsule 200 mg  200 mg Oral TID Purohit, Shrey C, MD   200 mg at 04/29/18 0945  . insulin aspart (novoLOG) injection 0-15 Units  0-15 Units Subcutaneous TID WC  Melida Quitter, MD   2 Units at 04/29/18 [phone]  . insulin aspart (novoLOG) injection 0-5 Units  0-5 Units Subcutaneous QHS Melida Quitter, MD      . lactated ringers infusion   Intravenous Continuous Melida Quitter, MD 10 mL/hr at 04/26/18 2103    . MEDLINE mouth rinse  15 mL Mouth Rinse q12n4p Kinsinger, Arta Bruce, MD   15 mL at 04/28/18 1819  . memantine (NAMENDA) tablet 5 mg  5 mg Oral Daily Purohit, Shrey C, MD   5 mg at 04/29/18 0944  . morphine 2 MG/ML injection 2-4 mg  2-4 mg Intravenous Q2H PRN Kinsinger, Arta Bruce, MD   2 mg at 04/29/18 0926  . ondansetron (ZOFRAN) injection 4 mg  4 mg Intravenous Q4H PRN Kinsinger, Arta Bruce, MD   4 mg at 04/21/18 1419  . oxyCODONE (ROXICODONE) 5 MG/5ML solution 5 mg  5 mg Per Tube Q6H PRN Kinsinger, Arta Bruce, MD   5 mg at 04/28/18 1614  . pantoprazole sodium (PROTONIX) 40 mg/20 mL oral suspension 40 mg  40 mg Per Tube BID Dessa Phi, DO   40 mg at 04/29/18 0946  . sertraline (ZOLOFT) tablet 50 mg  50 mg Oral Daily Purohit, Shrey C, MD   50 mg at 04/29/18 0945  . white petrolatum (VASELINE) gel   Topical BID Kinsinger, Arta Bruce, MD   0.2 application at 12/20/10 1975     Discharge Medications: Please see discharge summary for a list of discharge medications.  Relevant Imaging Results:  Relevant Lab Results:   Additional Information SSN: [id number]  [place], LCSWA

## 2018-04-29 NOTE — Progress Notes (Signed)
Report given to Delsa Sale, RN at [facility]

## 2018-05-03 DIAGNOSIS — F0390 Unspecified dementia without behavioral disturbance: Secondary | ICD-10-CM | POA: Diagnosis not present

## 2018-05-03 DIAGNOSIS — N183 Chronic kidney disease, stage 3 (moderate): Secondary | ICD-10-CM | POA: Diagnosis not present

## 2018-05-03 DIAGNOSIS — C4402 Squamous cell carcinoma of skin of lip: Secondary | ICD-10-CM | POA: Diagnosis not present

## 2018-05-03 DIAGNOSIS — F17211 Nicotine dependence, cigarettes, in remission: Secondary | ICD-10-CM | POA: Diagnosis not present

## 2018-05-03 DIAGNOSIS — F4312 Post-traumatic stress disorder, chronic: Secondary | ICD-10-CM | POA: Diagnosis not present

## 2018-05-03 DIAGNOSIS — B370 Candidal stomatitis: Secondary | ICD-10-CM | POA: Diagnosis not present

## 2018-05-03 DIAGNOSIS — F331 Major depressive disorder, recurrent, moderate: Secondary | ICD-10-CM | POA: Diagnosis not present

## 2018-05-05 ENCOUNTER — Encounter: Payer: Self-pay | Admitting: Plastic Surgery

## 2018-05-05 ENCOUNTER — Encounter: Payer: Medicare Other | Admitting: Plastic Surgery

## 2018-05-05 ENCOUNTER — Ambulatory Visit (INDEPENDENT_AMBULATORY_CARE_PROVIDER_SITE_OTHER): Payer: Medicare Other | Admitting: Plastic Surgery

## 2018-05-05 VITALS — BP 107/68 | HR 75 | Ht 76.0 in | Wt 141.0 lb

## 2018-05-05 DIAGNOSIS — C4402 Squamous cell carcinoma of skin of lip: Secondary | ICD-10-CM

## 2018-05-05 DIAGNOSIS — C4442 Squamous cell carcinoma of skin of scalp and neck: Secondary | ICD-10-CM

## 2018-05-06 ENCOUNTER — Encounter: Payer: Self-pay | Admitting: Plastic Surgery

## 2018-05-06 DIAGNOSIS — C4442 Squamous cell carcinoma of skin of scalp and neck: Secondary | ICD-10-CM | POA: Insufficient documentation

## 2018-05-06 NOTE — Progress Notes (Signed)
   Subjective:    Patient ID: Anthony [name], male    DOB: 06-19-1940, 78 y.o.   MRN: [id number]  The patient is a 78 year old white male here with his guardian of the state for follow-up on his squamous cell carcinoma resections.  He had a squamous cell of his scalp that was excised.  ACell was placed.  He has a 2 x 3 cm area of bone exposed in the central portion of the wound.  The surrounding area is granulating in very nicely.  The left lower lip is healing well.  The right dehisced.  There is no sign of infection.  His tube feeds have been turned with an increase in protein.  He is bright eyed and witty today.     Review of Systems  Constitutional: Positive for activity change and appetite change.  HENT: Negative.   Eyes: Negative.   Respiratory: Negative.   Cardiovascular: Negative.   Gastrointestinal: Negative for abdominal pain.  Genitourinary: Negative.   Musculoskeletal: Negative for joint swelling.  Skin: Positive for color change and wound.  Hematological: Negative.        Objective:   Physical Exam Vitals signs and nursing note reviewed.  HENT:     Head: Normocephalic.      Nose: Nose normal.     Mouth/Throat:   Cardiovascular:     Rate and Rhythm: Normal rate.  Pulmonary:     Effort: Pulmonary effort is normal.  Neurological:     Mental Status: He is alert.  Psychiatric:        Mood and Affect: Mood normal.        Assessment & Plan:  Squamous cell carcinoma of lip  Squamous cell carcinoma of scalp Recommend continuing his protein and checking a pre-albumin.  If he does well we should be able to get him back to the operating room the end of March for lower lip repair and continued scalp treatment.  This is probably 1 of several procedures that will be needed due to the size and depth. He should put Vaseline to the lower lip twice a day and [place] to the scalp daily.  I would like to see him back in 2 weeks.

## 2018-05-10 ENCOUNTER — Encounter (HOSPITAL_COMMUNITY): Payer: Self-pay | Admitting: Otolaryngology

## 2018-05-14 DIAGNOSIS — C770 Secondary and unspecified malignant neoplasm of lymph nodes of head, face and neck: Secondary | ICD-10-CM | POA: Diagnosis present

## 2018-05-14 DIAGNOSIS — J449 Chronic obstructive pulmonary disease, unspecified: Secondary | ICD-10-CM | POA: Diagnosis present

## 2018-05-14 DIAGNOSIS — Z85819 Personal history of malignant neoplasm of unspecified site of lip, oral cavity, and pharynx: Secondary | ICD-10-CM | POA: Diagnosis not present

## 2018-05-14 DIAGNOSIS — F418 Other specified anxiety disorders: Secondary | ICD-10-CM | POA: Diagnosis present

## 2018-05-14 DIAGNOSIS — L03211 Cellulitis of face: Secondary | ICD-10-CM | POA: Diagnosis not present

## 2018-05-14 DIAGNOSIS — N3001 Acute cystitis with hematuria: Secondary | ICD-10-CM | POA: Diagnosis not present

## 2018-05-14 DIAGNOSIS — R52 Pain, unspecified: Secondary | ICD-10-CM | POA: Diagnosis not present

## 2018-05-14 DIAGNOSIS — F0390 Unspecified dementia without behavioral disturbance: Secondary | ICD-10-CM | POA: Diagnosis present

## 2018-05-14 DIAGNOSIS — Z87891 Personal history of nicotine dependence: Secondary | ICD-10-CM | POA: Diagnosis not present

## 2018-05-14 DIAGNOSIS — T83518A Infection and inflammatory reaction due to other urinary catheter, initial encounter: Secondary | ICD-10-CM | POA: Diagnosis not present

## 2018-05-14 DIAGNOSIS — N400 Enlarged prostate without lower urinary tract symptoms: Secondary | ICD-10-CM | POA: Diagnosis present

## 2018-05-14 DIAGNOSIS — Z880 Allergy status to penicillin: Secondary | ICD-10-CM | POA: Diagnosis not present

## 2018-05-14 DIAGNOSIS — N390 Urinary tract infection, site not specified: Secondary | ICD-10-CM | POA: Diagnosis not present

## 2018-05-14 DIAGNOSIS — I959 Hypotension, unspecified: Secondary | ICD-10-CM | POA: Diagnosis not present

## 2018-05-14 DIAGNOSIS — Z930 Tracheostomy status: Secondary | ICD-10-CM | POA: Diagnosis not present

## 2018-05-14 DIAGNOSIS — Z91030 Bee allergy status: Secondary | ICD-10-CM | POA: Diagnosis not present

## 2018-05-14 DIAGNOSIS — L03311 Cellulitis of abdominal wall: Secondary | ICD-10-CM | POA: Diagnosis not present

## 2018-05-14 DIAGNOSIS — R1110 Vomiting, unspecified: Secondary | ICD-10-CM | POA: Diagnosis present

## 2018-05-14 DIAGNOSIS — Z931 Gastrostomy status: Secondary | ICD-10-CM | POA: Diagnosis not present

## 2018-05-14 DIAGNOSIS — D649 Anemia, unspecified: Secondary | ICD-10-CM | POA: Diagnosis present

## 2018-05-14 DIAGNOSIS — Z91018 Allergy to other foods: Secondary | ICD-10-CM | POA: Diagnosis not present

## 2018-05-14 DIAGNOSIS — L989 Disorder of the skin and subcutaneous tissue, unspecified: Secondary | ICD-10-CM | POA: Diagnosis present

## 2018-05-14 DIAGNOSIS — R509 Fever, unspecified: Secondary | ICD-10-CM | POA: Diagnosis not present

## 2018-05-14 DIAGNOSIS — K922 Gastrointestinal hemorrhage, unspecified: Secondary | ICD-10-CM | POA: Diagnosis not present

## 2018-05-14 DIAGNOSIS — J984 Other disorders of lung: Secondary | ICD-10-CM | POA: Diagnosis not present

## 2018-05-14 DIAGNOSIS — N179 Acute kidney failure, unspecified: Secondary | ICD-10-CM | POA: Diagnosis not present

## 2018-05-14 DIAGNOSIS — R109 Unspecified abdominal pain: Secondary | ICD-10-CM | POA: Diagnosis not present

## 2018-05-15 ENCOUNTER — Telehealth: Payer: Self-pay | Admitting: *Deleted

## 2018-05-15 NOTE — Telephone Encounter (Signed)
Received call from Betsey Amen (Education officer, museum) regarding the patient.  She stated the patient was admitted to the hospital on yesterday morning for a fever of 103.  Patient was diagnosed with Pneumonia and Urosepsis.   Patient is to be discharged today or tomorrow.   She said the patient is scheduled for a follow-up appointment with Dr. Marla Roe on this Friday, and scheduled for surgery on Monday.  She is wanting to know if they should keep the appointments.  Melissa also stated that the patient is sucking on his bottom lip.  She also stated that she feels the patient is pulling out his feeding tube and he is aspirating after 3:00 o'clock.  Please advise.//AB/CMA

## 2018-05-16 DIAGNOSIS — J449 Chronic obstructive pulmonary disease, unspecified: Secondary | ICD-10-CM | POA: Diagnosis not present

## 2018-05-16 DIAGNOSIS — J690 Pneumonitis due to inhalation of food and vomit: Secondary | ICD-10-CM | POA: Diagnosis not present

## 2018-05-16 DIAGNOSIS — C4402 Squamous cell carcinoma of skin of lip: Secondary | ICD-10-CM | POA: Diagnosis not present

## 2018-05-16 DIAGNOSIS — E43 Unspecified severe protein-calorie malnutrition: Secondary | ICD-10-CM | POA: Diagnosis not present

## 2018-05-16 NOTE — Telephone Encounter (Addendum)
Informed Dr. Marla Roe of the message below.  Per Dr. Marla Roe we will need to cancel both appointments and she will let Mikala know.  I also informed her that I had spoken with Melissa this morning regarding the patient and Lenna Sciara stated that the patient had been discharged on yesterday.  She stated that the discharge papers did not mention that the patient had Pneumonia.  It only said that the patient had a UTI and what antibiotic he was placed on.   She said she asked for the patient to have a swallowing test while in the hospital, but it was not done.  She also noticed that the patients Albumin level was low at (3.2) when the normal is (3.4-5.4) so she asked for dietary monitoring.  Per Dr. Marla Roe thanks for letting her know.//AB/CMA

## 2018-05-17 DIAGNOSIS — E43 Unspecified severe protein-calorie malnutrition: Secondary | ICD-10-CM | POA: Diagnosis not present

## 2018-05-17 DIAGNOSIS — J449 Chronic obstructive pulmonary disease, unspecified: Secondary | ICD-10-CM | POA: Diagnosis not present

## 2018-05-17 DIAGNOSIS — C4402 Squamous cell carcinoma of skin of lip: Secondary | ICD-10-CM | POA: Diagnosis not present

## 2018-05-17 DIAGNOSIS — J690 Pneumonitis due to inhalation of food and vomit: Secondary | ICD-10-CM | POA: Diagnosis not present

## 2018-05-19 ENCOUNTER — Ambulatory Visit: Payer: Medicare Other | Admitting: Plastic Surgery

## 2018-05-24 DIAGNOSIS — F0150 Vascular dementia without behavioral disturbance: Secondary | ICD-10-CM | POA: Diagnosis not present

## 2018-05-24 DIAGNOSIS — F4312 Post-traumatic stress disorder, chronic: Secondary | ICD-10-CM | POA: Diagnosis not present

## 2018-05-24 DIAGNOSIS — F331 Major depressive disorder, recurrent, moderate: Secondary | ICD-10-CM | POA: Diagnosis not present

## 2018-06-13 DIAGNOSIS — K9423 Gastrostomy malfunction: Secondary | ICD-10-CM | POA: Diagnosis not present

## 2018-06-14 DIAGNOSIS — C4402 Squamous cell carcinoma of skin of lip: Secondary | ICD-10-CM | POA: Diagnosis not present

## 2018-06-14 DIAGNOSIS — N183 Chronic kidney disease, stage 3 (moderate): Secondary | ICD-10-CM | POA: Diagnosis not present

## 2018-06-14 DIAGNOSIS — J449 Chronic obstructive pulmonary disease, unspecified: Secondary | ICD-10-CM | POA: Diagnosis not present

## 2018-06-14 DIAGNOSIS — F0390 Unspecified dementia without behavioral disturbance: Secondary | ICD-10-CM | POA: Diagnosis not present

## 2018-06-19 DIAGNOSIS — F331 Major depressive disorder, recurrent, moderate: Secondary | ICD-10-CM | POA: Diagnosis not present

## 2018-06-19 DIAGNOSIS — F4312 Post-traumatic stress disorder, chronic: Secondary | ICD-10-CM | POA: Diagnosis not present

## 2018-06-19 DIAGNOSIS — F0150 Vascular dementia without behavioral disturbance: Secondary | ICD-10-CM | POA: Diagnosis not present

## 2018-06-28 DIAGNOSIS — T83010A Breakdown (mechanical) of cystostomy catheter, initial encounter: Secondary | ICD-10-CM | POA: Diagnosis not present

## 2018-06-29 DIAGNOSIS — R509 Fever, unspecified: Secondary | ICD-10-CM | POA: Diagnosis not present

## 2018-07-03 DIAGNOSIS — J449 Chronic obstructive pulmonary disease, unspecified: Secondary | ICD-10-CM | POA: Diagnosis not present

## 2018-07-03 DIAGNOSIS — F0390 Unspecified dementia without behavioral disturbance: Secondary | ICD-10-CM | POA: Diagnosis not present

## 2018-07-03 DIAGNOSIS — J690 Pneumonitis due to inhalation of food and vomit: Secondary | ICD-10-CM | POA: Diagnosis not present

## 2018-07-05 ENCOUNTER — Other Ambulatory Visit: Payer: Self-pay

## 2018-07-05 ENCOUNTER — Ambulatory Visit (INDEPENDENT_AMBULATORY_CARE_PROVIDER_SITE_OTHER): Payer: Medicare Other | Admitting: Plastic Surgery

## 2018-07-05 ENCOUNTER — Encounter: Payer: Self-pay | Admitting: Plastic Surgery

## 2018-07-05 DIAGNOSIS — Z720 Tobacco use: Secondary | ICD-10-CM

## 2018-07-05 DIAGNOSIS — C4402 Squamous cell carcinoma of skin of lip: Secondary | ICD-10-CM | POA: Insufficient documentation

## 2018-07-05 DIAGNOSIS — C4442 Squamous cell carcinoma of skin of scalp and neck: Secondary | ICD-10-CM

## 2018-07-05 NOTE — Progress Notes (Signed)
   Subjective:    Patient ID: Anthony [name], male    DOB: [birth year], 78 y.o.   MRN: [id number]  The patient is a 78 yrs old wm on the phone via video.  He is now eating well and his nutrition is improving.  He is pleased with his progress.  The scalp area is being dressed daily with silver alginate.  He had pneumonia and has recovered well.  He uses a rag while eating to help keep the food in his mouth.  No sign of infection in either area.     Review of Systems  Constitutional: Negative.   HENT: Negative.   Eyes: Negative.   Respiratory: Negative.   Cardiovascular: Negative.   Gastrointestinal: Negative.   Genitourinary: Negative.   Skin: Positive for wound.  Psychiatric/Behavioral: Negative.        Objective:   Physical Exam Constitutional:      Appearance: Normal appearance.  HENT:     Nose: Nose normal.  Skin:    General: Skin is warm.  Neurological:     Mental Status: He is alert.  Psychiatric:        Mood and Affect: Mood normal.       Assessment & Plan:  Tobacco abuse  Squamous cell carcinoma of scalp  Squamous cell cancer of lip  One month follow up.  Plan for revision when able and pandemic over.  By scheduling this appointment and accepting meeting code, patient gave consent to a virtual video visit. This is also granting permission to assess and treat via this visit.

## 2018-07-12 DIAGNOSIS — G2581 Restless legs syndrome: Secondary | ICD-10-CM | POA: Diagnosis not present

## 2018-07-12 DIAGNOSIS — I959 Hypotension, unspecified: Secondary | ICD-10-CM | POA: Diagnosis not present

## 2018-07-12 DIAGNOSIS — J449 Chronic obstructive pulmonary disease, unspecified: Secondary | ICD-10-CM | POA: Diagnosis not present

## 2018-07-12 DIAGNOSIS — F0390 Unspecified dementia without behavioral disturbance: Secondary | ICD-10-CM | POA: Diagnosis not present

## 2018-07-17 DIAGNOSIS — F0150 Vascular dementia without behavioral disturbance: Secondary | ICD-10-CM | POA: Diagnosis not present

## 2018-07-17 DIAGNOSIS — F331 Major depressive disorder, recurrent, moderate: Secondary | ICD-10-CM | POA: Diagnosis not present

## 2018-07-17 DIAGNOSIS — F4312 Post-traumatic stress disorder, chronic: Secondary | ICD-10-CM | POA: Diagnosis not present

## 2018-08-14 DIAGNOSIS — F331 Major depressive disorder, recurrent, moderate: Secondary | ICD-10-CM | POA: Diagnosis not present

## 2018-08-14 DIAGNOSIS — F4312 Post-traumatic stress disorder, chronic: Secondary | ICD-10-CM | POA: Diagnosis not present

## 2018-08-14 DIAGNOSIS — F0150 Vascular dementia without behavioral disturbance: Secondary | ICD-10-CM | POA: Diagnosis not present

## 2018-08-16 DIAGNOSIS — G2581 Restless legs syndrome: Secondary | ICD-10-CM | POA: Diagnosis not present

## 2018-08-16 DIAGNOSIS — F0390 Unspecified dementia without behavioral disturbance: Secondary | ICD-10-CM | POA: Diagnosis not present

## 2018-08-16 DIAGNOSIS — J9811 Atelectasis: Secondary | ICD-10-CM | POA: Diagnosis not present

## 2018-08-16 DIAGNOSIS — E43 Unspecified severe protein-calorie malnutrition: Secondary | ICD-10-CM | POA: Diagnosis not present

## 2018-08-16 DIAGNOSIS — J449 Chronic obstructive pulmonary disease, unspecified: Secondary | ICD-10-CM | POA: Diagnosis not present

## 2018-08-29 ENCOUNTER — Encounter: Payer: Medicare Other | Admitting: Plastic Surgery

## 2018-08-30 DIAGNOSIS — J449 Chronic obstructive pulmonary disease, unspecified: Secondary | ICD-10-CM | POA: Diagnosis not present

## 2018-08-30 DIAGNOSIS — N183 Chronic kidney disease, stage 3 (moderate): Secondary | ICD-10-CM | POA: Diagnosis not present

## 2018-09-05 ENCOUNTER — Encounter: Payer: Self-pay | Admitting: Plastic Surgery

## 2018-09-05 ENCOUNTER — Ambulatory Visit (INDEPENDENT_AMBULATORY_CARE_PROVIDER_SITE_OTHER): Payer: Medicare Other | Admitting: Plastic Surgery

## 2018-09-05 ENCOUNTER — Other Ambulatory Visit: Payer: Self-pay

## 2018-09-05 DIAGNOSIS — S0100XA Unspecified open wound of scalp, initial encounter: Secondary | ICD-10-CM

## 2018-09-05 DIAGNOSIS — C001 Malignant neoplasm of external lower lip: Secondary | ICD-10-CM

## 2018-09-05 DIAGNOSIS — C4402 Squamous cell carcinoma of skin of lip: Secondary | ICD-10-CM

## 2018-09-05 NOTE — H&P (View-Only) (Signed)
Patient ID: Anthony [name], male    DOB: 1940/12/27, 78 y.o.   MRN: [id number]   Chief Complaint  Patient presents with  . Skin Problem    The patient is in the nursing facility.  He is doing much better with tolerating food intake.  He states he eats everything he can.  He is having a challenge keeping liquids in due to a lack of oral competence.  The scalp is getting smaller.  He has no other complaints.   Review of Systems  Constitutional: Negative.   HENT: Negative.   Eyes: Negative.   Respiratory: Negative.   Cardiovascular: Negative.   Gastrointestinal: Negative.   Genitourinary: Negative.   Musculoskeletal: Negative.   Psychiatric/Behavioral: Negative.     Past Medical History:  Diagnosis Date  . Acute pyelonephritis 12/23/2014  . Arthritis   . Asthma   . BPH (benign prostatic hyperplasia)   . Cancer ([place])    Lip cancer,has a place on the top of head  . Chronic back pain   . Chronic hip pain   . CKD (chronic kidney disease)   . COPD (chronic obstructive pulmonary disease) ([place])   . Dementia ([place])   . Depression   . Dysphagia   . GERD (gastroesophageal reflux disease)   . Headache   . History of kidney stones   . Hypertension    no longer on medication  . Kidney stone   . Neuropathy   . Pneumonia    november 2019  bilateral  . PTSD (post-traumatic stress disorder)   . Pulmonary nodules 12/25/2014  . Restless leg syndrome     Past Surgical History:  Procedure Laterality Date  . ABDOMINAL SURGERY     heria  . ESOPHAGOGASTRODUODENOSCOPY N/A 07/06/2017   Procedure: ESOPHAGOGASTRODUODENOSCOPY (EGD);  Surgeon: Rogene Houston, MD;  [facility];  Service: Endoscopy;  Laterality: N/A;  . EYE SURGERY    . FRACTURE SURGERY  2008   hip fracture with nailing  . HERNIA REPAIR    . IR CATHETER TUBE CHANGE  01/26/2018  . LAPAROSCOPIC GASTROSTOMY N/A 04/21/2018   Procedure: LAPAROSCOPIC GASTROSTOMY WITH G-TUBE PLACEMENT;  Surgeon: Kinsinger, Arta Bruce, MD;  Location: [place];  Service: General;  Laterality: N/A;  . LAPAROSCOPIC GASTROSTOMY N/A 04/20/2018   Procedure: ATTEMPTED ENDOSCOPIC GASTROSTOMY;  Surgeon: Kieth Brightly Arta Bruce, MD;  Location: [place];  Service: General;  Laterality: N/A;  . LESION EXCISION WITH COMPLEX REPAIR N/A 04/12/2018   Procedure: TOTAL LOWER LIP RESECTION;  Surgeon: Melida Quitter, MD;  Location: [place];  Service: ENT;  Laterality: N/A;  . NASAL FLAP ROTATION N/A 04/20/2018   Procedure: REVISION LOWER LIP FLAP AND SCALP FLAP;  Surgeon: Wallace Going, DO;  Location: [place];  Service: Plastics;  Laterality: N/A;  . RADICAL NECK DISSECTION Bilateral 04/12/2018   Procedure: Bilateral Radical Neck Dissection;  Surgeon: Melida Quitter, MD;  Location: [place];  Service: ENT;  Laterality: Bilateral;  . SCALP LACERATION REPAIR N/A 04/20/2018   Procedure: WIDE SCALP EXCISION SKIN CANCER X 7 CM;  Surgeon: Melida Quitter, MD;  Location: [place];  Service: ENT;  Laterality: N/A;  . supubic catheter    . TRACHEOSTOMY TUBE PLACEMENT N/A 04/12/2018   Procedure: TRACHEOSTOMY;  Surgeon: Melida Quitter, MD;  Location: [facility];  Service: ENT;  Laterality: N/A;      Current Outpatient Medications:  .  albuterol (PROVENTIL HFA;VENTOLIN HFA) 108 (90 BASE) MCG/ACT inhaler, Inhale 2 puffs into the  lungs every 6 (six) hours as needed for wheezing or shortness of breath. , Disp: , Rfl:  .  buPROPion (WELLBUTRIN) 100 MG tablet, Take 100 mg by mouth daily. (0900), Disp: , Rfl:  .  famotidine (PEPCID) 10 MG tablet, Take 40 mg by mouth daily. (0900), Disp: , Rfl:  .  gabapentin (NEURONTIN) 100 MG capsule, Take 200 mg by mouth 3 (three) times daily. (0900, 1700, & 2100), Disp: , Rfl:  .  memantine (NAMENDA) 5 MG tablet, Take 1 tablet (5 mg total) by mouth daily. (0900), Disp: , Rfl:  .  Nutritional Supplements (FEEDING SUPPLEMENT, OSMOLITE 1.2 CAL,) LIQD, Place 1,000 mLs into feeding tube continuous., Disp: 1000 mL, Rfl: 0 .  oxyCODONE (ROXICODONE) 5  MG/5ML solution, Place 5 mLs (5 mg total) into feeding tube every 6 (six) hours as needed for moderate pain., Disp: 30 mL, Rfl: 0 .  polyethylene glycol (MIRALAX) packet, Take 17 g by mouth daily. (Patient taking differently: Take 17 g by mouth 2 (two) times daily. ), Disp: 14 each, Rfl: 0 .  pramipexole (MIRAPEX) 0.125 MG tablet, Take 0.125 mg by mouth at bedtime. (2100), Disp: , Rfl:  .  sennosides-docusate sodium (SENOKOT-S) 8.6-50 MG tablet, Take 1 tablet by mouth daily as needed for constipation., Disp: , Rfl:  .  sertraline (ZOLOFT) 50 MG tablet, Take 50 mg by mouth daily. (0900), Disp: , Rfl:  .  Water For Irrigation, Sterile (FREE WATER) SOLN, Place 200 mLs into feeding tube every 8 (eight) hours., Disp: 1000 mL, Rfl: 0 .  white petrolatum (VASELINE) OINT, Apply 1 application topically 2 (two) times daily., Disp: 1 g, Rfl: 0   Objective:   There were no vitals filed for this visit.  Physical Exam Constitutional:      Appearance: Normal appearance.  Neurological:     Mental Status: He is alert. Mental status is at baseline.  Psychiatric:        Mood and Affect: Mood normal.     Assessment & Plan:  Open wound of scalp, unspecified open wound type, initial encounter  Squamous cell cancer of lip  Cancer of lower lip  Plan for reconstruction of lower lip and scalp. The patient gave consent to have this visit done by telemedicine / virtual visit.  This is also consent for access the chart and treat the patient via this visit. The patient is located in the nursing facility.  I, the provider, am at the office.  We spent 5 minutes together for the visit.  Joined by the nurse.  [place], DO

## 2018-09-05 NOTE — Progress Notes (Signed)
Patient ID: Anthony [name], male    DOB: Feb 18, 1941, 78 y.o.   MRN: [id number]   Chief Complaint  Patient presents with  . Skin Problem    The patient is in the nursing facility.  He is doing much better with tolerating food intake.  He states he eats everything he can.  He is having a challenge keeping liquids in due to a lack of oral competence.  The scalp is getting smaller.  He has no other complaints.   Review of Systems  Constitutional: Negative.   HENT: Negative.   Eyes: Negative.   Respiratory: Negative.   Cardiovascular: Negative.   Gastrointestinal: Negative.   Genitourinary: Negative.   Musculoskeletal: Negative.   Psychiatric/Behavioral: Negative.     Past Medical History:  Diagnosis Date  . Acute pyelonephritis 12/23/2014  . Arthritis   . Asthma   . BPH (benign prostatic hyperplasia)   . Cancer ([place])    Lip cancer,has a place on the top of head  . Chronic back pain   . Chronic hip pain   . CKD (chronic kidney disease)   . COPD (chronic obstructive pulmonary disease) ([place])   . Dementia ([place])   . Depression   . Dysphagia   . GERD (gastroesophageal reflux disease)   . Headache   . History of kidney stones   . Hypertension    no longer on medication  . Kidney stone   . Neuropathy   . Pneumonia    november 2019  bilateral  . PTSD (post-traumatic stress disorder)   . Pulmonary nodules 12/25/2014  . Restless leg syndrome     Past Surgical History:  Procedure Laterality Date  . ABDOMINAL SURGERY     heria  . ESOPHAGOGASTRODUODENOSCOPY N/A 07/06/2017   Procedure: ESOPHAGOGASTRODUODENOSCOPY (EGD);  Surgeon: Rogene Houston, MD;  [facility];  Service: Endoscopy;  Laterality: N/A;  . EYE SURGERY    . FRACTURE SURGERY  2008   hip fracture with nailing  . HERNIA REPAIR    . IR CATHETER TUBE CHANGE  01/26/2018  . LAPAROSCOPIC GASTROSTOMY N/A 04/21/2018   Procedure: LAPAROSCOPIC GASTROSTOMY WITH G-TUBE PLACEMENT;  Surgeon: Kinsinger, Arta Bruce, MD;  Location: [place];  Service: General;  Laterality: N/A;  . LAPAROSCOPIC GASTROSTOMY N/A 04/20/2018   Procedure: ATTEMPTED ENDOSCOPIC GASTROSTOMY;  Surgeon: Kieth Brightly Arta Bruce, MD;  Location: [place];  Service: General;  Laterality: N/A;  . LESION EXCISION WITH COMPLEX REPAIR N/A 04/12/2018   Procedure: TOTAL LOWER LIP RESECTION;  Surgeon: Melida Quitter, MD;  Location: [place];  Service: ENT;  Laterality: N/A;  . NASAL FLAP ROTATION N/A 04/20/2018   Procedure: REVISION LOWER LIP FLAP AND SCALP FLAP;  Surgeon: Wallace Going, DO;  Location: [place];  Service: Plastics;  Laterality: N/A;  . RADICAL NECK DISSECTION Bilateral 04/12/2018   Procedure: Bilateral Radical Neck Dissection;  Surgeon: Melida Quitter, MD;  Location: [place];  Service: ENT;  Laterality: Bilateral;  . SCALP LACERATION REPAIR N/A 04/20/2018   Procedure: WIDE SCALP EXCISION SKIN CANCER X 7 CM;  Surgeon: Melida Quitter, MD;  Location: [place];  Service: ENT;  Laterality: N/A;  . supubic catheter    . TRACHEOSTOMY TUBE PLACEMENT N/A 04/12/2018   Procedure: TRACHEOSTOMY;  Surgeon: Melida Quitter, MD;  [facility];  Service: ENT;  Laterality: N/A;      Current Outpatient Medications:  .  albuterol (PROVENTIL HFA;VENTOLIN HFA) 108 (90 BASE) MCG/ACT inhaler, Inhale 2 puffs into the  lungs every 6 (six) hours as needed for wheezing or shortness of breath. , Disp: , Rfl:  .  buPROPion (WELLBUTRIN) 100 MG tablet, Take 100 mg by mouth daily. (0900), Disp: , Rfl:  .  famotidine (PEPCID) 10 MG tablet, Take 40 mg by mouth daily. (0900), Disp: , Rfl:  .  gabapentin (NEURONTIN) 100 MG capsule, Take 200 mg by mouth 3 (three) times daily. (0900, 1700, & 2100), Disp: , Rfl:  .  memantine (NAMENDA) 5 MG tablet, Take 1 tablet (5 mg total) by mouth daily. (0900), Disp: , Rfl:  .  Nutritional Supplements (FEEDING SUPPLEMENT, OSMOLITE 1.2 CAL,) LIQD, Place 1,000 mLs into feeding tube continuous., Disp: 1000 mL, Rfl: 0 .  oxyCODONE (ROXICODONE) 5  MG/5ML solution, Place 5 mLs (5 mg total) into feeding tube every 6 (six) hours as needed for moderate pain., Disp: 30 mL, Rfl: 0 .  polyethylene glycol (MIRALAX) packet, Take 17 g by mouth daily. (Patient taking differently: Take 17 g by mouth 2 (two) times daily. ), Disp: 14 each, Rfl: 0 .  pramipexole (MIRAPEX) 0.125 MG tablet, Take 0.125 mg by mouth at bedtime. (2100), Disp: , Rfl:  .  sennosides-docusate sodium (SENOKOT-S) 8.6-50 MG tablet, Take 1 tablet by mouth daily as needed for constipation., Disp: , Rfl:  .  sertraline (ZOLOFT) 50 MG tablet, Take 50 mg by mouth daily. (0900), Disp: , Rfl:  .  Water For Irrigation, Sterile (FREE WATER) SOLN, Place 200 mLs into feeding tube every 8 (eight) hours., Disp: 1000 mL, Rfl: 0 .  white petrolatum (VASELINE) OINT, Apply 1 application topically 2 (two) times daily., Disp: 1 g, Rfl: 0   Objective:   There were no vitals filed for this visit.  Physical Exam Constitutional:      Appearance: Normal appearance.  Neurological:     Mental Status: He is alert. Mental status is at baseline.  Psychiatric:        Mood and Affect: Mood normal.     Assessment & Plan:  Open wound of scalp, unspecified open wound type, initial encounter  Squamous cell cancer of lip  Cancer of lower lip  Plan for reconstruction of lower lip and scalp. The patient gave consent to have this visit done by telemedicine / virtual visit.  This is also consent for access the chart and treat the patient via this visit. The patient is located in the nursing facility.  I, the provider, am at the office.  We spent 5 minutes together for the visit.  Joined by the nurse.  [place], DO

## 2018-09-11 DIAGNOSIS — F0150 Vascular dementia without behavioral disturbance: Secondary | ICD-10-CM | POA: Diagnosis not present

## 2018-09-11 DIAGNOSIS — F331 Major depressive disorder, recurrent, moderate: Secondary | ICD-10-CM | POA: Diagnosis not present

## 2018-09-11 DIAGNOSIS — F4312 Post-traumatic stress disorder, chronic: Secondary | ICD-10-CM | POA: Diagnosis not present

## 2018-09-12 ENCOUNTER — Encounter: Payer: Medicare Other | Admitting: Plastic Surgery

## 2018-09-12 DIAGNOSIS — K219 Gastro-esophageal reflux disease without esophagitis: Secondary | ICD-10-CM | POA: Diagnosis not present

## 2018-09-12 DIAGNOSIS — N189 Chronic kidney disease, unspecified: Secondary | ICD-10-CM | POA: Diagnosis not present

## 2018-09-12 DIAGNOSIS — N1330 Unspecified hydronephrosis: Secondary | ICD-10-CM | POA: Diagnosis not present

## 2018-09-12 DIAGNOSIS — K9423 Gastrostomy malfunction: Secondary | ICD-10-CM | POA: Diagnosis not present

## 2018-09-12 DIAGNOSIS — L03311 Cellulitis of abdominal wall: Secondary | ICD-10-CM | POA: Diagnosis not present

## 2018-09-12 DIAGNOSIS — Z431 Encounter for attention to gastrostomy: Secondary | ICD-10-CM | POA: Diagnosis not present

## 2018-09-12 DIAGNOSIS — F0390 Unspecified dementia without behavioral disturbance: Secondary | ICD-10-CM | POA: Diagnosis not present

## 2018-09-12 DIAGNOSIS — J45909 Unspecified asthma, uncomplicated: Secondary | ICD-10-CM | POA: Diagnosis not present

## 2018-09-12 DIAGNOSIS — Z87891 Personal history of nicotine dependence: Secondary | ICD-10-CM | POA: Diagnosis not present

## 2018-09-12 DIAGNOSIS — N134 Hydroureter: Secondary | ICD-10-CM | POA: Diagnosis not present

## 2018-09-12 DIAGNOSIS — Z79899 Other long term (current) drug therapy: Secondary | ICD-10-CM | POA: Diagnosis not present

## 2018-09-12 DIAGNOSIS — F17211 Nicotine dependence, cigarettes, in remission: Secondary | ICD-10-CM | POA: Diagnosis not present

## 2018-09-12 DIAGNOSIS — K9422 Gastrostomy infection: Secondary | ICD-10-CM | POA: Diagnosis not present

## 2018-09-14 ENCOUNTER — Encounter (HOSPITAL_COMMUNITY): Payer: Self-pay | Admitting: *Deleted

## 2018-09-14 ENCOUNTER — Other Ambulatory Visit: Payer: Self-pay

## 2018-09-14 NOTE — Progress Notes (Addendum)
Pt is a resident at [facility] in [place]. I have spoken with his POA, Betsey Amen, SW and she will be here on Monday at 7:30 to sign for patient. Pt has dementia with no short term memory per [facility] and has no pain tolerance of any kind. She states pt may say he isn't going to have surgery, but she states just remind him that he doesn't he won't be able to eat cereal again or ever close his mouth. She states that will make him agreeable. I also spoke with Tammy, LPN at [facility] and she states pt has not had any recent chest pain or sob. Pt is not Diabetic. Pt has a trach and a G-tube. Pt prefers his medications via G-tube, he will not have any oral medications the morning of surgery.  Faxed pre-op instructions to Tammy at [phone]  Tammy states there has been no Covid cases at the facility. She states pt has not had any symptoms. Will need Covid test Monday prior to surgery.   Coronavirus Screening  Have you experienced the following symptoms:  Cough NO Fever (>100.41F) NO Runny nose NO Sore throat NO Difficulty breathing/shortness of breath  NO  Have you or a family member traveled in the last 14 days and where? NO

## 2018-09-14 NOTE — Pre-Procedure Instructions (Addendum)
    Anthony [name]  09/14/2018    Mr. Alix's procedure is scheduled on Monday, 09/18/18 at 10:00 AM.   Report to [facility] Entrance "A" Admitting Office at 8:00 AM.   Call this number if you have problems the morning of surgery: [phone]   Remember:  Patient is not to eat or drink after midnight Sunday, 09/17/18.  Take these medicines the morning of surgery: Duo-Neb - prn, Albuterol inhaler - prn (please send this inhaler with pt morning of surgery).   Stop Multivitamins and Melatonin as of today, prior to surgery.  Per instructions Dr. Letta Moynahan, Anesthesiologist/Tyrin Herbers Stan Head, RN    Do not wear jewelry.  Do not wear lotions, powders, cologne or deodorant.  Men may shave face and neck.  Do not bring valuables to the hospital.  [facility] is not responsible for any belongings or valuables.  Contacts, dentures or bridgework may not be worn into surgery.  Leave your suitcase in the car.  After surgery it may be brought to your room.  For patients admitted to the hospital, discharge time will be determined by your treatment team.  Any questions today, please call Lilia Pro, RN at [phone].

## 2018-09-18 ENCOUNTER — Ambulatory Visit (HOSPITAL_COMMUNITY): Payer: Medicare Other | Admitting: Anesthesiology

## 2018-09-18 ENCOUNTER — Encounter (HOSPITAL_COMMUNITY): Payer: Self-pay | Admitting: Orthopedic Surgery

## 2018-09-18 ENCOUNTER — Ambulatory Visit (HOSPITAL_COMMUNITY)
Admission: RE | Admit: 2018-09-18 | Discharge: 2018-09-18 | Disposition: A | Discharge status: Home / Self Care | Payer: Medicare Other | Attending: Plastic Surgery | Admitting: Plastic Surgery

## 2018-09-18 ENCOUNTER — Other Ambulatory Visit: Payer: Self-pay

## 2018-09-18 ENCOUNTER — Encounter (HOSPITAL_COMMUNITY)
Admission: RE | Disposition: A | Discharge status: Home / Self Care | Payer: Self-pay | Source: Home / Self Care | Attending: Plastic Surgery

## 2018-09-18 DIAGNOSIS — Z79899 Other long term (current) drug therapy: Secondary | ICD-10-CM | POA: Diagnosis not present

## 2018-09-18 DIAGNOSIS — N189 Chronic kidney disease, unspecified: Secondary | ICD-10-CM | POA: Diagnosis not present

## 2018-09-18 DIAGNOSIS — Z930 Tracheostomy status: Secondary | ICD-10-CM | POA: Insufficient documentation

## 2018-09-18 DIAGNOSIS — G8929 Other chronic pain: Secondary | ICD-10-CM | POA: Diagnosis not present

## 2018-09-18 DIAGNOSIS — T8131XA Disruption of external operation (surgical) wound, not elsewhere classified, initial encounter: Secondary | ICD-10-CM | POA: Diagnosis not present

## 2018-09-18 DIAGNOSIS — M1990 Unspecified osteoarthritis, unspecified site: Secondary | ICD-10-CM | POA: Diagnosis not present

## 2018-09-18 DIAGNOSIS — G2581 Restless legs syndrome: Secondary | ICD-10-CM | POA: Diagnosis not present

## 2018-09-18 DIAGNOSIS — Z481 Encounter for planned postprocedural wound closure: Secondary | ICD-10-CM | POA: Insufficient documentation

## 2018-09-18 DIAGNOSIS — G629 Polyneuropathy, unspecified: Secondary | ICD-10-CM | POA: Insufficient documentation

## 2018-09-18 DIAGNOSIS — J449 Chronic obstructive pulmonary disease, unspecified: Secondary | ICD-10-CM | POA: Insufficient documentation

## 2018-09-18 DIAGNOSIS — J45909 Unspecified asthma, uncomplicated: Secondary | ICD-10-CM | POA: Insufficient documentation

## 2018-09-18 DIAGNOSIS — F0390 Unspecified dementia without behavioral disturbance: Secondary | ICD-10-CM | POA: Diagnosis not present

## 2018-09-18 DIAGNOSIS — F4310 Post-traumatic stress disorder, unspecified: Secondary | ICD-10-CM | POA: Insufficient documentation

## 2018-09-18 DIAGNOSIS — F329 Major depressive disorder, single episode, unspecified: Secondary | ICD-10-CM | POA: Insufficient documentation

## 2018-09-18 DIAGNOSIS — Z1159 Encounter for screening for other viral diseases: Secondary | ICD-10-CM | POA: Insufficient documentation

## 2018-09-18 DIAGNOSIS — K219 Gastro-esophageal reflux disease without esophagitis: Secondary | ICD-10-CM | POA: Diagnosis not present

## 2018-09-18 DIAGNOSIS — Z428 Encounter for other plastic and reconstructive surgery following medical procedure or healed injury: Secondary | ICD-10-CM | POA: Insufficient documentation

## 2018-09-18 DIAGNOSIS — Z931 Gastrostomy status: Secondary | ICD-10-CM | POA: Insufficient documentation

## 2018-09-18 DIAGNOSIS — L989 Disorder of the skin and subcutaneous tissue, unspecified: Secondary | ICD-10-CM | POA: Diagnosis not present

## 2018-09-18 DIAGNOSIS — I129 Hypertensive chronic kidney disease with stage 1 through stage 4 chronic kidney disease, or unspecified chronic kidney disease: Secondary | ICD-10-CM | POA: Diagnosis not present

## 2018-09-18 DIAGNOSIS — Z85828 Personal history of other malignant neoplasm of skin: Secondary | ICD-10-CM | POA: Diagnosis not present

## 2018-09-18 DIAGNOSIS — L089 Local infection of the skin and subcutaneous tissue, unspecified: Secondary | ICD-10-CM | POA: Diagnosis not present

## 2018-09-18 DIAGNOSIS — D649 Anemia, unspecified: Secondary | ICD-10-CM | POA: Diagnosis not present

## 2018-09-18 DIAGNOSIS — F418 Other specified anxiety disorders: Secondary | ICD-10-CM | POA: Diagnosis not present

## 2018-09-18 DIAGNOSIS — I10 Essential (primary) hypertension: Secondary | ICD-10-CM | POA: Diagnosis not present

## 2018-09-18 HISTORY — PX: APPLICATION OF A-CELL OF HEAD/NECK: SHX6304

## 2018-09-18 HISTORY — PX: CLEFT LIP REPAIR: SHX5315

## 2018-09-18 LAB — SARS CORONAVIRUS 2 BY RT PCR (HOSPITAL ORDER, PERFORMED IN ~~LOC~~ HOSPITAL LAB): SARS Coronavirus 2: NEGATIVE

## 2018-09-18 LAB — BASIC METABOLIC PANEL
Anion gap: 11 (ref 5–15)
BUN: 29 mg/dL — ABNORMAL HIGH (ref 8–23)
CO2: 22 mmol/L (ref 22–32)
Calcium: 9.2 mg/dL (ref 8.9–10.3)
Chloride: 107 mmol/L (ref 98–111)
Creatinine, Ser: 2.37 mg/dL — ABNORMAL HIGH (ref 0.61–1.24)
GFR calc Af Amer: 29 mL/min — ABNORMAL LOW (ref >60)
GFR calc non Af Amer: 25 mL/min — ABNORMAL LOW (ref >60)
Glucose, Bld: 95 mg/dL (ref 70–99)
Potassium: 4.4 mmol/L (ref 3.5–5.1)
Sodium: 140 mmol/L (ref 135–145)

## 2018-09-18 LAB — CBC
HCT: 36.4 % — ABNORMAL LOW (ref 39.0–52.0)
Hemoglobin: 11.4 g/dL — ABNORMAL LOW (ref 13.0–17.0)
MCH: 30.3 pg (ref 26.0–34.0)
MCHC: 31.3 g/dL (ref 30.0–36.0)
MCV: 96.8 fL (ref 80.0–100.0)
Platelets: 234 10*3/uL (ref 150–400)
RBC: 3.76 MIL/uL — ABNORMAL LOW (ref 4.22–5.81)
RDW: 14.7 % (ref 11.5–15.5)
WBC: 7.3 10*3/uL (ref 4.0–10.5)
nRBC: 0 % (ref 0.0–0.2)

## 2018-09-18 SURGERY — APPLICATION, ACELLULAR DERMAL REPLACEMENT, HEAD
Anesthesia: General | Site: Scalp

## 2018-09-18 MED ORDER — CIPROFLOXACIN IN D5W 400 MG/200ML IV SOLN
INTRAVENOUS | Status: DC | PRN
  Administered 2018-09-18: 400 mg via INTRAVENOUS

## 2018-09-18 MED ORDER — LACTATED RINGERS IV SOLN
INTRAVENOUS | Status: DC
  Administered 2018-09-18 (×2): via INTRAVENOUS

## 2018-09-18 MED ORDER — EPHEDRINE SULFATE 50 MG/ML IJ SOLN
INTRAMUSCULAR | Status: DC | PRN
  Administered 2018-09-18: 10 mg via INTRAVENOUS

## 2018-09-18 MED ORDER — PROPOFOL 10 MG/ML IV BOLUS
INTRAVENOUS | Status: AC
  Filled 2018-09-18: qty 20

## 2018-09-18 MED ORDER — BUPIVACAINE-EPINEPHRINE (PF) 0.25% -1:200000 IJ SOLN
INTRAMUSCULAR | Status: AC
  Filled 2018-09-18: qty 30

## 2018-09-18 MED ORDER — 0.9 % SODIUM CHLORIDE (POUR BTL) OPTIME
TOPICAL | Status: DC | PRN
  Administered 2018-09-18: 12:00:00 1000 mL

## 2018-09-18 MED ORDER — FENTANYL CITRATE (PF) 100 MCG/2ML IJ SOLN
INTRAMUSCULAR | Status: AC
  Filled 2018-09-18: qty 2

## 2018-09-18 MED ORDER — MUPIROCIN 2 % EX OINT
TOPICAL_OINTMENT | CUTANEOUS | Status: AC
  Filled 2018-09-18: qty 22

## 2018-09-18 MED ORDER — OXYCODONE HCL 5 MG PO TABS: 5.0000 mg | ORAL_TABLET | Freq: Once | ORAL | Status: AC | PRN

## 2018-09-18 MED ORDER — ACETAMINOPHEN 325 MG PO TABS: 650.0000 mg | ORAL_TABLET | ORAL | Status: DC | PRN

## 2018-09-18 MED ORDER — DEXAMETHASONE SODIUM PHOSPHATE 10 MG/ML IJ SOLN
INTRAMUSCULAR | Status: DC | PRN
  Administered 2018-09-18: 5 mg via INTRAVENOUS

## 2018-09-18 MED ORDER — SODIUM CHLORIDE 0.9% FLUSH: 3.0000 mL | Freq: Two times a day (BID) | INTRAVENOUS | Status: DC

## 2018-09-18 MED ORDER — ACETAMINOPHEN 325 MG PO TABS: 325.0000 mg | ORAL_TABLET | ORAL | Status: DC | PRN

## 2018-09-18 MED ORDER — CIPROFLOXACIN IN D5W 400 MG/200ML IV SOLN
INTRAVENOUS | Status: AC
  Filled 2018-09-18: qty 200

## 2018-09-18 MED ORDER — LIDOCAINE-EPINEPHRINE 1 %-1:100000 IJ SOLN
INTRAMUSCULAR | Status: AC
  Filled 2018-09-18: qty 1

## 2018-09-18 MED ORDER — SODIUM CHLORIDE 0.9% FLUSH: 3.0000 mL | INTRAVENOUS | Status: DC | PRN

## 2018-09-18 MED ORDER — FENTANYL CITRATE (PF) 250 MCG/5ML IJ SOLN
INTRAMUSCULAR | Status: AC
  Filled 2018-09-18: qty 5

## 2018-09-18 MED ORDER — LIDOCAINE 2% (20 MG/ML) 5 ML SYRINGE
INTRAMUSCULAR | Status: DC | PRN
  Administered 2018-09-18: 40 mg via INTRAVENOUS

## 2018-09-18 MED ORDER — ONDANSETRON HCL 4 MG/2ML IJ SOLN: 4.0000 mg | Freq: Once | INTRAMUSCULAR | Status: DC | PRN

## 2018-09-18 MED ORDER — SUGAMMADEX SODIUM 200 MG/2ML IV SOLN
INTRAVENOUS | Status: DC | PRN
  Administered 2018-09-18: 180 mg via INTRAVENOUS

## 2018-09-18 MED ORDER — OXYCODONE HCL 5 MG/5ML PO SOLN
5.0000 mg | Freq: Once | ORAL | Status: AC | PRN
  Administered 2018-09-18: 15:00:00 5 mg via ORAL

## 2018-09-18 MED ORDER — ACETAMINOPHEN 650 MG RE SUPP: 650.0000 mg | RECTAL | Status: DC | PRN

## 2018-09-18 MED ORDER — MUPIROCIN 2 % EX OINT
TOPICAL_OINTMENT | CUTANEOUS | Status: DC | PRN
  Administered 2018-09-18: 1 via TOPICAL

## 2018-09-18 MED ORDER — FENTANYL CITRATE (PF) 100 MCG/2ML IJ SOLN
25.0000 ug | INTRAMUSCULAR | Status: DC | PRN
  Administered 2018-09-18 (×3): 50 ug via INTRAVENOUS

## 2018-09-18 MED ORDER — SODIUM CHLORIDE 0.9 % IV SOLN
INTRAVENOUS | Status: DC | PRN
  Administered 2018-09-18: 25 ug/min via INTRAVENOUS

## 2018-09-18 MED ORDER — PROPOFOL 10 MG/ML IV BOLUS
INTRAVENOUS | Status: DC | PRN
  Administered 2018-09-18: 100 mg via INTRAVENOUS

## 2018-09-18 MED ORDER — LIDOCAINE-EPINEPHRINE 1 %-1:100000 IJ SOLN
INTRAMUSCULAR | Status: DC | PRN
  Administered 2018-09-18: 20 mL via INTRADERMAL

## 2018-09-18 MED ORDER — ROCURONIUM BROMIDE 50 MG/5ML IV SOSY
PREFILLED_SYRINGE | INTRAVENOUS | Status: DC | PRN
  Administered 2018-09-18: 30 mg via INTRAVENOUS

## 2018-09-18 MED ORDER — ACETAMINOPHEN 160 MG/5ML PO SOLN: 325.0000 mg | ORAL | Status: DC | PRN

## 2018-09-18 MED ORDER — MEPERIDINE HCL 25 MG/ML IJ SOLN: 6.2500 mg | INTRAMUSCULAR | Status: DC | PRN

## 2018-09-18 MED ORDER — FENTANYL CITRATE (PF) 100 MCG/2ML IJ SOLN
INTRAMUSCULAR | Status: DC | PRN
  Administered 2018-09-18: 100 ug via INTRAVENOUS

## 2018-09-18 MED ORDER — OXYCODONE HCL 5 MG PO TABS: 5.0000 mg | ORAL_TABLET | ORAL | Status: DC | PRN

## 2018-09-18 MED ORDER — SODIUM CHLORIDE 0.9 % IV SOLN: 250.0000 mL | INTRAVENOUS | Status: DC | PRN

## 2018-09-18 MED ORDER — PHENYLEPHRINE HCL (PRESSORS) 10 MG/ML IV SOLN
INTRAVENOUS | Status: DC | PRN
  Administered 2018-09-18 (×2): 120 ug via INTRAVENOUS
  Administered 2018-09-18: 80 ug via INTRAVENOUS

## 2018-09-18 MED ORDER — ONDANSETRON HCL 4 MG/2ML IJ SOLN
INTRAMUSCULAR | Status: DC | PRN
  Administered 2018-09-18: 4 mg via INTRAVENOUS

## 2018-09-18 MED ORDER — OXYCODONE HCL 5 MG/5ML PO SOLN
ORAL | Status: AC
  Filled 2018-09-18: qty 5

## 2018-09-18 SURGICAL SUPPLY — 81 items
BLADE CLIPPER SURG (BLADE) ×4 IMPLANT
BLADE SURG 11 STRL SS (BLADE) IMPLANT
BNDG CONFORM 2 STRL LF (GAUZE/BANDAGES/DRESSINGS) IMPLANT
BNDG GAUZE ELAST 4 BULKY (GAUZE/BANDAGES/DRESSINGS) IMPLANT
CANISTER SUCT 3000ML PPV (MISCELLANEOUS) IMPLANT
CLEANER TIP ELECTROSURG 2X2 (MISCELLANEOUS) ×4 IMPLANT
CLOSURE WOUND 1/2 X4 (GAUZE/BANDAGES/DRESSINGS) ×1
CLOSURE WOUND 1/4 X3 (GAUZE/BANDAGES/DRESSINGS)
CONT SPEC 4OZ CLIKSEAL STRL BL (MISCELLANEOUS) IMPLANT
CONT SPEC STER OR (MISCELLANEOUS) ×4 IMPLANT
COVER SURGICAL LIGHT HANDLE (MISCELLANEOUS) ×4 IMPLANT
COVER WAND RF STERILE (DRAPES) IMPLANT
DRESSING HYDROCOLLOID 4X4 (GAUZE/BANDAGES/DRESSINGS) ×4 IMPLANT
DRSG CUTIMED SORBACT 7X9 (GAUZE/BANDAGES/DRESSINGS) ×4 IMPLANT
DRSG EMULSION OIL 3X3 NADH (GAUZE/BANDAGES/DRESSINGS) IMPLANT
DRSG MEPILEX BORDER 4X4 (GAUZE/BANDAGES/DRESSINGS) ×4 IMPLANT
DRSG TEGADERM 4X4.75 (GAUZE/BANDAGES/DRESSINGS) ×4 IMPLANT
ELECT COATED BLADE 2.86 ST (ELECTRODE) ×4 IMPLANT
ELECT NEEDLE BLADE 2-5/6 (NEEDLE) ×4 IMPLANT
ELECT NEEDLE TIP 2.8 STRL (NEEDLE) IMPLANT
ELECT REM PT RETURN 9FT ADLT (ELECTROSURGICAL) ×4
ELECT REM PT RETURN 9FT NEONAT (ELECTRODE) IMPLANT
ELECT REM PT RETURN 9FT PED (ELECTROSURGICAL)
ELECTRODE REM PT RETRN 9FT PED (ELECTROSURGICAL) IMPLANT
ELECTRODE REM PT RTRN 9FT ADLT (ELECTROSURGICAL) ×2 IMPLANT
GAUZE 4X4 16PLY RFD (DISPOSABLE) ×4 IMPLANT
GAUZE SPONGE 2X2 8PLY STRL LF (GAUZE/BANDAGES/DRESSINGS) ×2 IMPLANT
GAUZE SPONGE 4X4 12PLY STRL (GAUZE/BANDAGES/DRESSINGS) IMPLANT
GLOVE BIO SURGEON STRL SZ 6.5 (GLOVE) ×9 IMPLANT
GLOVE BIO SURGEON STRL SZ7.5 (GLOVE) ×4 IMPLANT
GLOVE BIO SURGEONS STRL SZ 6.5 (GLOVE) ×3
GLOVE BIOGEL PI IND STRL 6.5 (GLOVE) ×2 IMPLANT
GLOVE BIOGEL PI INDICATOR 6.5 (GLOVE) ×2
GLOVE SURG SS PI 6.5 STRL IVOR (GLOVE) ×4 IMPLANT
GOWN STRL REUS W/ TWL LRG LVL3 (GOWN DISPOSABLE) ×6 IMPLANT
GOWN STRL REUS W/TWL LRG LVL3 (GOWN DISPOSABLE) ×6
KIT BASIN OR (CUSTOM PROCEDURE TRAY) ×4 IMPLANT
KIT TURNOVER KIT B (KITS) ×4 IMPLANT
MANIFOLD NEPTUNE WASTE (CANNULA) ×4 IMPLANT
MATRIX WOUND 3-LAYER 5X5 (Tissue) ×3 IMPLANT
NEEDLE HYPO 25GX1X1/2 BEV (NEEDLE) ×4 IMPLANT
NEEDLE HYPO 30X.5 LL (NEEDLE) IMPLANT
NS IRRIG 1000ML POUR BTL (IV SOLUTION) ×4 IMPLANT
PAD ARMBOARD 7.5X6 YLW CONV (MISCELLANEOUS) ×8 IMPLANT
PENCIL BUTTON HOLSTER BLD 10FT (ELECTRODE) IMPLANT
SCISSORS WIRE DISP (INSTRUMENTS) ×4 IMPLANT
SOL PREP POV-IOD 4OZ 10% (MISCELLANEOUS) ×4 IMPLANT
SPONGE GAUZE 2X2 STER 10/PKG (GAUZE/BANDAGES/DRESSINGS) ×2
STRIP CLOSURE SKIN 1/2X4 (GAUZE/BANDAGES/DRESSINGS) ×3 IMPLANT
STRIP CLOSURE SKIN 1/4X3 (GAUZE/BANDAGES/DRESSINGS) IMPLANT
SURGILUBE 2OZ TUBE FLIPTOP (MISCELLANEOUS) ×4 IMPLANT
SUT CHROMIC 4 0 P 3 18 (SUTURE) IMPLANT
SUT ETHILON 4 0 PS 2 18 (SUTURE) IMPLANT
SUT ETHILON 5 0 P 3 18 (SUTURE)
SUT MNCRL AB 4-0 PS2 18 (SUTURE) ×4 IMPLANT
SUT MON AB 5-0 P3 18 (SUTURE) IMPLANT
SUT MON AB 5-0 PS2 18 (SUTURE) ×16 IMPLANT
SUT NYLON ETHILON 5-0 P-3 1X18 (SUTURE) IMPLANT
SUT PROLENE 6 0 PC 1 (SUTURE) IMPLANT
SUT SILK 3 0 PS 1 (SUTURE) ×4 IMPLANT
SUT SILK 4 0 (SUTURE)
SUT SILK 4-0 18XBRD TIE 12 (SUTURE) IMPLANT
SUT SILK 6 0 P 1 (SUTURE) IMPLANT
SUT VIC AB 3-0 PS1 18 (SUTURE) ×2
SUT VIC AB 3-0 PS1 18XBRD (SUTURE) ×2 IMPLANT
SUT VIC AB 4-0 P-3 18X BRD (SUTURE) IMPLANT
SUT VIC AB 4-0 P-3 18XBRD (SUTURE) IMPLANT
SUT VIC AB 4-0 P3 18 (SUTURE)
SUT VIC AB 4-0 PS2 18 (SUTURE) ×12 IMPLANT
SUT VIC AB 4-0 PS2 27 (SUTURE) ×16 IMPLANT
SUT VIC AB 5-0 P-3 18X BRD (SUTURE) ×4 IMPLANT
SUT VIC AB 5-0 P-3 18XBRD (SUTURE) IMPLANT
SUT VIC AB 5-0 P3 18 (SUTURE) ×4
SUT VICRYL 6 0 P 1 18 (SUTURE) IMPLANT
SYR BULB IRRIGATION 50ML (SYRINGE) IMPLANT
TOWEL GREEN STERILE (TOWEL DISPOSABLE) ×4 IMPLANT
TOWEL GREEN STERILE FF (TOWEL DISPOSABLE) IMPLANT
TRAY ENT MC OR (CUSTOM PROCEDURE TRAY) ×4 IMPLANT
WATER STERILE IRR 1000ML POUR (IV SOLUTION) IMPLANT
WOUND MATRIX 3-LAYER 5X5 (Tissue) ×1 IMPLANT
YANKAUER SUCT BULB TIP NO VENT (SUCTIONS) IMPLANT

## 2018-09-18 NOTE — Op Note (Signed)
DATE OF OPERATION: 09/18/2018  LOCATION: [facility]  PREOPERATIVE DIAGNOSIS:  1. Lower lip defect from extensive skin cancer. 2. left cheek changing skin lesion 3. Scalp defect  POSTOPERATIVE DIAGNOSIS: Same  PROCEDURE:  1.  Lip switch flap for lower lip reconstruction of 3 cm defect full-thickness. 2. Excision of 1.6 x 2 cm changing skin lesion left cheek. 3.  Preparation of scalp defect 3 x 3 cm for placement of ACell (5 x 5 cm sheet).  SURGEON: Paddy Neis Sanger Shineka Auble, DO  ASSISTANT: Bonita Cox, RNFA  EBL: 5 cc  CONDITION: Stable  COMPLICATIONS: None  INDICATION: The patient, Anthony [name], is a 78 y.o. male born on Jun 08, 1940, is here for treatment of a scalp defect, changing skin lesion of the left cheek and lower lip defect.  He underwent extensive excision of the lower lip cancer.  He had reconstruction and had a portion of the due to poor nutrition and manual manipulation.  He presents for further reconstruction for achieving oral continence.   PROCEDURE DETAILS:  The patient was seen prior to surgery and marked.  The IV antibiotics were given. The patient was taken to the operating room and given a general anesthetic. A standard time out was performed and all information was confirmed by those in the room. SCDs were placed.   Local with epinephrine was injected at all sites to help with intraoperative hemostasis and postoperative pain control.  Lower lip: A lip switch was performed.  I started with excision of the right lower middle lip edge.  The tissue scissors and Bovie were used to release the muscle.  Attention was then turned towards the left side the healed skin was excised with a 15 blade.  The muscle was identified and released with the tissue scissors and the Bovie.  The tethering to the oral gingiva was released with the Bovie.  The ellipse which was then performed with excision of a upside down V-shaped from the upper lip.  This was done  adjacent to the cupids bow on the right side.  The medial aspect of the flap was kept connected at the red portion of the lip.  The other full-thickness flap was rotated inferiorly to fit within the created lower lip space.  This helped to create re-create the oral buccal mucosa, muscle and skin.  The buccal mucosa was then sutured in place with 5-0 Vicryl simple interrupted sutures were used.  The muscle was then attached to the adjacent muscle on the right and the left with the 4-0 Vicryl.  Finally the skin was reapproximated with 5-0 Monocryl with a combination of vertical mattress and simple interrupted stitches.  This filled the full-thickness 3 cm defect of the lower lip.  A retention stitch was placed at the lower chin to the base of the right ala to prevent too much tugging on the flap.  Left Cheek: The #15 blade was used to make an elliptical incision around the changing skin lesion on her left cheek.  The 1.6 x 2 cm defect was then closed with the deep layers with 4-0 Monocryl.  The skin was closed with a running 5-0 Monocryl.  Dermabond was applied.  The specimen was sent to pathology.  Scalp defect: The scalp defect was 3 x 3 cm this was cleaned around the edges.  There was some eschar that was in place this was debrided.  The ACell sheet was then applied with 3 x 3 cm for the size.  It was  secured to the skin with 5-0 Vicryl.  A sorbact was applied and secured as well with a 5-0 Vicryl.  A sterile dressing was applied with K-Y jelly. The patient was allowed to wake up and taken to recovery room in stable condition at the end of the case. The caseworker was notified at the end of the case.   The RNFA assisted throughout the case.  The RNFA was essential in retraction and counter traction when needed to make the case progress smoothly.  This retraction and assistance made it possible to see the tissue plans for the procedure.  The assistance was needed for blood control, tissue re-approximation and  assisted with closure of the incision site.

## 2018-09-18 NOTE — Anesthesia Preprocedure Evaluation (Addendum)
Anesthesia Evaluation  Patient identified by MRN, date of birth, ID band Patient awake    Reviewed: Allergy & Precautions, NPO status , Patient's Chart, lab work & pertinent test results  History of Anesthesia Complications Negative for: history of anesthetic complications  Airway Mallampati: Trach      Comment: If surgeon requires muscle relaxant, will replace cuffless trach with ett during procedure. Dental   Pulmonary asthma , COPD, former smoker,    Pulmonary exam normal        Cardiovascular hypertension, Normal cardiovascular exam     Neuro/Psych PSYCHIATRIC DISORDERS Anxiety Depression Dementia negative neurological ROS     GI/Hepatic Neg liver ROS, GERD  Medicated,  Endo/Other  negative endocrine ROS  Renal/GU Renal Insufficiency and CRFRenal disease Bladder dysfunction (BOO with suprapubic catheter)      Musculoskeletal  (+) Arthritis , Osteoarthritis,    Abdominal   Peds  Hematology  (+) Blood dyscrasia, anemia ,   Anesthesia Other Findings   Reproductive/Obstetrics                            Anesthesia Physical  Anesthesia Plan  ASA: III  Anesthesia Plan: General   Post-op Pain Management:    Induction: Intravenous and Inhalational  PONV Risk Score and Plan: 2 and Ondansetron, Dexamethasone and Treatment may vary due to age or medical condition  Airway Management Planned: Tracheostomy  Additional Equipment: None  Intra-op Plan:   Post-operative Plan: Post-operative intubation/ventilation  Informed Consent: I have reviewed the patients History and Physical, chart, labs and discussed the procedure including the risks, benefits and alternatives for the proposed anesthesia with the patient or authorized representative who has indicated his/her understanding and acceptance.       Plan Discussed with: CRNA, Anesthesiologist and Surgeon  Anesthesia Plan Comments:          Anesthesia Quick Evaluation

## 2018-09-18 NOTE — Discharge Instructions (Signed)
INSTRUCTIONS FOR AFTER SURGERY   You are having surgery.  You will likely have some questions about what to expect following your operation.  The following information will help you and your family understand what to expect when you are discharged from the hospital.  Following these guidelines will help ensure a smooth recovery and reduce risks of complications.   Postoperative instructions include information on: diet, wound care, medications and physical activity.  AFTER SURGERY Expect to go home after the procedure.   DIET Liquids only through a straw if able otherwise G tube and can supplement with G-tube feedings.  WOUND CARE Baby wipes or sink bath till follow up. KY gel to scalp daily. Bactroban to the lip and left cheek.  ACTIVITY No heavy lifting until cleared by the doctor.  It is OK to walk and climb stairs as able. You will be more comfortable if you sleep and rest with your head elevated either with a few pillows under you or in a recliner.  No stomach sleeping for a few months.  BOWEL MOVEMENTS Constipation can occur after anesthesia and while taking pain medication.  It is important to stay ahead for your comfort.  We recommend taking Milk of Magnesia (2 tablespoons; twice a day) while taking the pain pills.  WHEN TO CALL Call your surgeon's office if any of the following occur:  Fever 101 degrees F or greater  Excessive bleeding or fluid from the incision site.  Pain that increases over time without aid from the medications  Redness, warmth, or pus draining from incision sites  Persistent nausea or inability to take in liquids  Severe misshapen area that underwent the operation.

## 2018-09-18 NOTE — Transfer of Care (Signed)
Immediate Anesthesia Transfer of Care Note  Patient: Anthony [name]  Procedure(s) Performed: Preperation of scalp wound for placement of A-Cell (N/A Scalp) Reconstruction of upper and lower lip defect (N/A Mouth)  Patient Location: PACU  Anesthesia Type:General  Level of Consciousness: awake, orientation as the same as in preop.  Airway & Oxygen Therapy: Patient Spontanous Breathing, O2 blow by over the trach  Post-op Assessment: Report given to RN and Post -op Vital signs reviewed and stable  Post vital signs: Reviewed and stable  Last Vitals:  Vitals Value Taken Time  BP 95/61 09/18/18 1352  Temp    Pulse 48 09/18/18 1359  Resp 32 09/18/18 1358  SpO2 90 % 09/18/18 1359  Vitals shown include unvalidated device data.  Last Pain:  Vitals:   09/18/18 0831  TempSrc: Oral         Complications: No apparent anesthesia complications

## 2018-09-18 NOTE — Anesthesia Procedure Notes (Signed)
Procedure Name: Intubation Date/Time: 09/18/2018 11:45 AM Performed by: Kathryne Hitch, CRNA Pre-anesthesia Checklist: Patient identified, Emergency Drugs available, Suction available, Timeout performed and Patient being monitored Patient Re-evaluated:Patient Re-evaluated prior to induction Oxygen Delivery Method: Circle system utilized Preoxygenation: Pre-oxygenation with 100% oxygen Induction Type: IV induction and Tracheostomy Tube size: 6.0 mm Number of attempts: 1 Placement Confirmation: breath sounds checked- equal and bilateral Tube secured with: Tape Comments: 6.0 ETT placed through patient trach stoma. +BBS, VSS.

## 2018-09-18 NOTE — Interval H&P Note (Signed)
History and Physical Interval Note:  09/18/2018 10:53 AM  Anthony [name]  has presented today for surgery, with the diagnosis of Scalp wound and MOHS defect lower lip.  The various methods of treatment have been discussed with the patient and family. After consideration of risks, benefits and other options for treatment, the patient has consented to  Procedure(s) with comments: Preperation of scalp wound for placement of A-Cell (N/A) Reconstruction of lower lip defect (N/A) - Total surgery time 2 hours as a surgical intervention.  The patient's history has been reviewed, patient examined, no change in status, stable for surgery.  I have reviewed the patient's chart and labs.  Questions were answered to the patient's satisfaction.     Loel Lofty Dillingham

## 2018-09-18 NOTE — Anesthesia Postprocedure Evaluation (Signed)
Anesthesia Post Note  Patient: Anthony [name]  Procedure(s) Performed: Preperation of scalp wound for placement of A-Cell (N/A Scalp) Reconstruction of upper and lower lip defect (N/A Mouth)     Patient location during evaluation: PACU Anesthesia Type: General Level of consciousness: awake and alert Pain management: pain level controlled Vital Signs Assessment: post-procedure vital signs reviewed and stable Respiratory status: spontaneous breathing, nonlabored ventilation, respiratory function stable and patient connected to nasal cannula oxygen Cardiovascular status: blood pressure returned to baseline and stable Postop Assessment: no apparent nausea or vomiting Anesthetic complications: no    Last Vitals:  Vitals:   09/18/18 0831 09/18/18 1352  BP: (!) 137/46 95/61  Pulse: 82 63  Resp:  17  Temp: 37.1 C (!) 36.3 C  SpO2: 99% (P) 100%    Last Pain:  Vitals:   09/18/18 0831  TempSrc: Oral                 Miran Kautzman

## 2018-09-19 ENCOUNTER — Telehealth: Payer: Self-pay | Admitting: Plastic Surgery

## 2018-09-19 ENCOUNTER — Encounter (HOSPITAL_COMMUNITY): Payer: Self-pay | Admitting: Plastic Surgery

## 2018-09-19 DIAGNOSIS — M6281 Muscle weakness (generalized): Secondary | ICD-10-CM | POA: Diagnosis not present

## 2018-09-19 DIAGNOSIS — R41841 Cognitive communication deficit: Secondary | ICD-10-CM | POA: Diagnosis not present

## 2018-09-19 DIAGNOSIS — R498 Other voice and resonance disorders: Secondary | ICD-10-CM | POA: Diagnosis not present

## 2018-09-19 DIAGNOSIS — C001 Malignant neoplasm of external lower lip: Secondary | ICD-10-CM | POA: Diagnosis not present

## 2018-09-19 DIAGNOSIS — R1314 Dysphagia, pharyngoesophageal phase: Secondary | ICD-10-CM | POA: Diagnosis not present

## 2018-09-19 DIAGNOSIS — R1311 Dysphagia, oral phase: Secondary | ICD-10-CM | POA: Diagnosis not present

## 2018-09-19 NOTE — Telephone Encounter (Signed)
Received call from Betsey Amen, social worker with concerns regarding the patient. She states since surgery yesterday he has been unable to eat. Requested call back at [phone] ext [phone].

## 2018-09-20 DIAGNOSIS — R1314 Dysphagia, pharyngoesophageal phase: Secondary | ICD-10-CM | POA: Diagnosis not present

## 2018-09-20 DIAGNOSIS — C001 Malignant neoplasm of external lower lip: Secondary | ICD-10-CM | POA: Diagnosis not present

## 2018-09-20 DIAGNOSIS — R41841 Cognitive communication deficit: Secondary | ICD-10-CM | POA: Diagnosis not present

## 2018-09-20 DIAGNOSIS — R498 Other voice and resonance disorders: Secondary | ICD-10-CM | POA: Diagnosis not present

## 2018-09-20 DIAGNOSIS — R1311 Dysphagia, oral phase: Secondary | ICD-10-CM | POA: Diagnosis not present

## 2018-09-20 DIAGNOSIS — M6281 Muscle weakness (generalized): Secondary | ICD-10-CM | POA: Diagnosis not present

## 2018-09-20 NOTE — Telephone Encounter (Signed)
Call from Social worker- Melissa re: pt's status following lip revision surgery on 09/18/18 Per Melissa & the facility's speech therapist- they are inquiring about his diet status- & his ability to talk- I explained to [facility] that Dr. Marla Roe suggested the pt be fed with either a straw or by PEG tube We instructed the care givers that Dr. Marla Roe sutured his mouth together to ensure that the graft stays intact for approx  2 weeks Pt has a virtual tele-visit scheduled later- They will call for any further concerns [place]

## 2018-09-20 NOTE — Telephone Encounter (Addendum)
Called on (09/19/18) and (LMOM @ 4:42pm) for Melissa, Education officer, museum regarding the message below.   Per Dr. Ledell Peoples he can get through a straw or his feeding tube.  Asked Melissa, social worker to give me a call back to let me know she received the message.//AB/CMA

## 2018-09-21 ENCOUNTER — Encounter (HOSPITAL_COMMUNITY): Payer: Self-pay | Admitting: *Deleted

## 2018-09-21 DIAGNOSIS — C4402 Squamous cell carcinoma of skin of lip: Secondary | ICD-10-CM | POA: Diagnosis not present

## 2018-09-21 DIAGNOSIS — M6281 Muscle weakness (generalized): Secondary | ICD-10-CM | POA: Diagnosis not present

## 2018-09-21 DIAGNOSIS — R1311 Dysphagia, oral phase: Secondary | ICD-10-CM | POA: Diagnosis not present

## 2018-09-21 DIAGNOSIS — C4440 Unspecified malignant neoplasm of skin of scalp and neck: Secondary | ICD-10-CM | POA: Diagnosis not present

## 2018-09-21 DIAGNOSIS — C001 Malignant neoplasm of external lower lip: Secondary | ICD-10-CM | POA: Diagnosis not present

## 2018-09-21 DIAGNOSIS — I700 Atherosclerosis of aorta: Secondary | ICD-10-CM | POA: Diagnosis not present

## 2018-09-21 DIAGNOSIS — R41841 Cognitive communication deficit: Secondary | ICD-10-CM | POA: Diagnosis not present

## 2018-09-21 DIAGNOSIS — R1314 Dysphagia, pharyngoesophageal phase: Secondary | ICD-10-CM | POA: Diagnosis not present

## 2018-09-21 DIAGNOSIS — J449 Chronic obstructive pulmonary disease, unspecified: Secondary | ICD-10-CM | POA: Diagnosis not present

## 2018-09-21 DIAGNOSIS — R498 Other voice and resonance disorders: Secondary | ICD-10-CM | POA: Diagnosis not present

## 2018-09-21 NOTE — Progress Notes (Signed)
Patient also complained about a sore changing skin lesion on the left cheek.  It was preauricular and ~ 2 cm in size.  The decision was made to take care of this at the same time.  Patient agreed. Will plan excision of the lesion and path evaluation.

## 2018-09-22 ENCOUNTER — Encounter: Payer: Medicare Other | Admitting: Plastic Surgery

## 2018-09-25 DIAGNOSIS — R41841 Cognitive communication deficit: Secondary | ICD-10-CM | POA: Diagnosis not present

## 2018-09-25 DIAGNOSIS — R498 Other voice and resonance disorders: Secondary | ICD-10-CM | POA: Diagnosis not present

## 2018-09-25 DIAGNOSIS — R1311 Dysphagia, oral phase: Secondary | ICD-10-CM | POA: Diagnosis not present

## 2018-09-25 DIAGNOSIS — M6281 Muscle weakness (generalized): Secondary | ICD-10-CM | POA: Diagnosis not present

## 2018-09-25 DIAGNOSIS — R1314 Dysphagia, pharyngoesophageal phase: Secondary | ICD-10-CM | POA: Diagnosis not present

## 2018-09-25 DIAGNOSIS — C001 Malignant neoplasm of external lower lip: Secondary | ICD-10-CM | POA: Diagnosis not present

## 2018-09-26 ENCOUNTER — Encounter: Payer: Medicare Other | Admitting: Plastic Surgery

## 2018-09-26 DIAGNOSIS — R41841 Cognitive communication deficit: Secondary | ICD-10-CM | POA: Diagnosis not present

## 2018-09-26 DIAGNOSIS — C001 Malignant neoplasm of external lower lip: Secondary | ICD-10-CM | POA: Diagnosis not present

## 2018-09-26 DIAGNOSIS — R1314 Dysphagia, pharyngoesophageal phase: Secondary | ICD-10-CM | POA: Diagnosis not present

## 2018-09-26 DIAGNOSIS — R1311 Dysphagia, oral phase: Secondary | ICD-10-CM | POA: Diagnosis not present

## 2018-09-26 DIAGNOSIS — R498 Other voice and resonance disorders: Secondary | ICD-10-CM | POA: Diagnosis not present

## 2018-09-26 DIAGNOSIS — M6281 Muscle weakness (generalized): Secondary | ICD-10-CM | POA: Diagnosis not present

## 2018-09-27 DIAGNOSIS — R1311 Dysphagia, oral phase: Secondary | ICD-10-CM | POA: Diagnosis not present

## 2018-09-27 DIAGNOSIS — R41841 Cognitive communication deficit: Secondary | ICD-10-CM | POA: Diagnosis not present

## 2018-09-27 DIAGNOSIS — M6281 Muscle weakness (generalized): Secondary | ICD-10-CM | POA: Diagnosis not present

## 2018-09-27 DIAGNOSIS — R498 Other voice and resonance disorders: Secondary | ICD-10-CM | POA: Diagnosis not present

## 2018-09-27 DIAGNOSIS — R1314 Dysphagia, pharyngoesophageal phase: Secondary | ICD-10-CM | POA: Diagnosis not present

## 2018-09-27 DIAGNOSIS — C001 Malignant neoplasm of external lower lip: Secondary | ICD-10-CM | POA: Diagnosis not present

## 2018-09-28 DIAGNOSIS — K9423 Gastrostomy malfunction: Secondary | ICD-10-CM | POA: Diagnosis not present

## 2018-09-28 DIAGNOSIS — J45909 Unspecified asthma, uncomplicated: Secondary | ICD-10-CM | POA: Diagnosis not present

## 2018-09-28 DIAGNOSIS — K219 Gastro-esophageal reflux disease without esophagitis: Secondary | ICD-10-CM | POA: Diagnosis not present

## 2018-09-28 DIAGNOSIS — R1314 Dysphagia, pharyngoesophageal phase: Secondary | ICD-10-CM | POA: Diagnosis not present

## 2018-09-28 DIAGNOSIS — Z87891 Personal history of nicotine dependence: Secondary | ICD-10-CM | POA: Diagnosis not present

## 2018-09-28 DIAGNOSIS — F0390 Unspecified dementia without behavioral disturbance: Secondary | ICD-10-CM | POA: Diagnosis not present

## 2018-09-28 DIAGNOSIS — Z431 Encounter for attention to gastrostomy: Secondary | ICD-10-CM | POA: Diagnosis not present

## 2018-09-28 DIAGNOSIS — C001 Malignant neoplasm of external lower lip: Secondary | ICD-10-CM | POA: Diagnosis not present

## 2018-09-28 DIAGNOSIS — R498 Other voice and resonance disorders: Secondary | ICD-10-CM | POA: Diagnosis not present

## 2018-09-28 DIAGNOSIS — Z79899 Other long term (current) drug therapy: Secondary | ICD-10-CM | POA: Diagnosis not present

## 2018-09-28 DIAGNOSIS — Z4682 Encounter for fitting and adjustment of non-vascular catheter: Secondary | ICD-10-CM | POA: Diagnosis not present

## 2018-09-28 DIAGNOSIS — R1311 Dysphagia, oral phase: Secondary | ICD-10-CM | POA: Diagnosis not present

## 2018-09-28 DIAGNOSIS — M6281 Muscle weakness (generalized): Secondary | ICD-10-CM | POA: Diagnosis not present

## 2018-09-28 DIAGNOSIS — R41841 Cognitive communication deficit: Secondary | ICD-10-CM | POA: Diagnosis not present

## 2018-09-28 DIAGNOSIS — N189 Chronic kidney disease, unspecified: Secondary | ICD-10-CM | POA: Diagnosis not present

## 2018-09-29 NOTE — Progress Notes (Signed)
Pt is a resident at [facility] in [place]. I have spoken with his POA, Betsey Amen, SW and she will be here on Monday at 10:00 AM to sign for patient. Pt has dementia with no short term memory per [facility] and has no pain tolerance of any kind. She states pt may say he isn't going to have surgery, but she states just remind him that he doesn't he won't be able to eat cereal again or ever close his mouth. She states that will make him agreeable. I also spoke with Barnett Applebaum, LPN at [facility] and she states pt has not had any recent chest pain or sob. Pt is not Diabetic. Pt has a trach and a G-tube. Pt prefers his medications via G-tube, he will not have any oral medications the morning of surgery. Barnett Applebaum did state pt pulled the G-tube out yesterday, but it has been put back in. She states has pulled some of the stitches in his lip out. Faxed pre-op instructions to [place] at [phone].  Pt will need to have the Covid 19 test done on arrival Monday AM.   Coronavirus Screening  Have you experienced the following symptoms:  Cough NO Fever (>100.32F)  NO Runny nose NO Sore throat NO Difficulty breathing/shortness of breath  NO  Have you or a family member traveled in the last 14 days and where? NO  .

## 2018-09-29 NOTE — Pre-Procedure Instructions (Signed)
   BRANSON KRANZ  09/29/2018    Mr. Whidbee's procedure is scheduled on Monday, 10/02/18 at 1:00 PM.   Report to [facility] Entrance "A" Admitting Office at 10:00 AM.   Call this number if you have problems the morning of surgery: [phone]   Remember:  Do not eat or drink after midnight Sunday, 10/01/18.  Take these medicines the morning of surgery with A SIP OF WATER: NONE  Patient may use his Duo-neb and his Albuterol inhaler if needed the morning of surgery.  Please hold patient's Melatonin and Multivitamins as of today prior to surgery.  Per instructions Dr. Therisa Doyne, Anesthesiologist/Zulay Corrie Stan Head, RN    Do not wear jewelry.  Do not wear lotions, powders, cologne or deodorant.  Men may shave face and neck.  Do not bring valuables to the hospital.  [facility] is not responsible for any belongings or valuables.  Contacts, dentures or bridgework may not be worn into surgery.  Leave your suitcase in the car.  After surgery it may be brought to your room.  For patients admitted to the hospital, discharge time will be determined by your treatment team.  Patients discharged the day of surgery will not be allowed to drive home.   Any questions today, please call me, Lilia Pro, RN at [phone]

## 2018-09-30 DIAGNOSIS — R1311 Dysphagia, oral phase: Secondary | ICD-10-CM | POA: Diagnosis not present

## 2018-09-30 DIAGNOSIS — R498 Other voice and resonance disorders: Secondary | ICD-10-CM | POA: Diagnosis not present

## 2018-09-30 DIAGNOSIS — R1314 Dysphagia, pharyngoesophageal phase: Secondary | ICD-10-CM | POA: Diagnosis not present

## 2018-09-30 DIAGNOSIS — M6281 Muscle weakness (generalized): Secondary | ICD-10-CM | POA: Diagnosis not present

## 2018-09-30 DIAGNOSIS — C001 Malignant neoplasm of external lower lip: Secondary | ICD-10-CM | POA: Diagnosis not present

## 2018-09-30 DIAGNOSIS — R41841 Cognitive communication deficit: Secondary | ICD-10-CM | POA: Diagnosis not present

## 2018-10-02 ENCOUNTER — Ambulatory Visit (HOSPITAL_COMMUNITY): Payer: Medicare Other | Admitting: Physician Assistant

## 2018-10-02 ENCOUNTER — Ambulatory Visit: Admit: 2018-10-02 | Payer: Medicare Other | Admitting: Plastic Surgery

## 2018-10-02 ENCOUNTER — Other Ambulatory Visit: Payer: Self-pay

## 2018-10-02 ENCOUNTER — Ambulatory Visit (HOSPITAL_COMMUNITY)
Admission: RE | Admit: 2018-10-02 | Discharge: 2018-10-02 | Disposition: A | Discharge status: Skilled Nursing Facility | Payer: Medicare Other | Attending: Plastic Surgery | Admitting: Plastic Surgery

## 2018-10-02 ENCOUNTER — Encounter (HOSPITAL_COMMUNITY)
Admission: RE | Disposition: A | Discharge status: Skilled Nursing Facility | Payer: Self-pay | Source: Home / Self Care | Attending: Plastic Surgery

## 2018-10-02 ENCOUNTER — Encounter (HOSPITAL_COMMUNITY): Payer: Self-pay

## 2018-10-02 DIAGNOSIS — Y832 Surgical operation with anastomosis, bypass or graft as the cause of abnormal reaction of the patient, or of later complication, without mention of misadventure at the time of the procedure: Secondary | ICD-10-CM | POA: Diagnosis not present

## 2018-10-02 DIAGNOSIS — Z87891 Personal history of nicotine dependence: Secondary | ICD-10-CM | POA: Insufficient documentation

## 2018-10-02 DIAGNOSIS — F0390 Unspecified dementia without behavioral disturbance: Secondary | ICD-10-CM | POA: Diagnosis not present

## 2018-10-02 DIAGNOSIS — J449 Chronic obstructive pulmonary disease, unspecified: Secondary | ICD-10-CM | POA: Diagnosis not present

## 2018-10-02 DIAGNOSIS — Z930 Tracheostomy status: Secondary | ICD-10-CM | POA: Insufficient documentation

## 2018-10-02 DIAGNOSIS — I959 Hypotension, unspecified: Secondary | ICD-10-CM | POA: Diagnosis not present

## 2018-10-02 DIAGNOSIS — I129 Hypertensive chronic kidney disease with stage 1 through stage 4 chronic kidney disease, or unspecified chronic kidney disease: Secondary | ICD-10-CM | POA: Insufficient documentation

## 2018-10-02 DIAGNOSIS — N183 Chronic kidney disease, stage 3 (moderate): Secondary | ICD-10-CM | POA: Diagnosis not present

## 2018-10-02 DIAGNOSIS — Z85828 Personal history of other malignant neoplasm of skin: Secondary | ICD-10-CM | POA: Diagnosis not present

## 2018-10-02 DIAGNOSIS — Z79899 Other long term (current) drug therapy: Secondary | ICD-10-CM | POA: Insufficient documentation

## 2018-10-02 DIAGNOSIS — T8131XA Disruption of external operation (surgical) wound, not elsewhere classified, initial encounter: Secondary | ICD-10-CM | POA: Diagnosis not present

## 2018-10-02 DIAGNOSIS — K130 Diseases of lips: Secondary | ICD-10-CM | POA: Diagnosis not present

## 2018-10-02 DIAGNOSIS — Z1159 Encounter for screening for other viral diseases: Secondary | ICD-10-CM | POA: Diagnosis not present

## 2018-10-02 DIAGNOSIS — M2550 Pain in unspecified joint: Secondary | ICD-10-CM | POA: Diagnosis not present

## 2018-10-02 DIAGNOSIS — Z85819 Personal history of malignant neoplasm of unspecified site of lip, oral cavity, and pharynx: Secondary | ICD-10-CM | POA: Diagnosis not present

## 2018-10-02 DIAGNOSIS — Z7401 Bed confinement status: Secondary | ICD-10-CM | POA: Diagnosis not present

## 2018-10-02 DIAGNOSIS — N189 Chronic kidney disease, unspecified: Secondary | ICD-10-CM | POA: Insufficient documentation

## 2018-10-02 DIAGNOSIS — T86828 Other complications of skin graft (allograft) (autograft): Secondary | ICD-10-CM | POA: Insufficient documentation

## 2018-10-02 DIAGNOSIS — Z931 Gastrostomy status: Secondary | ICD-10-CM | POA: Insufficient documentation

## 2018-10-02 HISTORY — PX: INCISION AND DRAINAGE: SHX5863

## 2018-10-02 HISTORY — PX: ORAL EXCISION OF SCAR CONTRACTURE AND TISSUE ADVANCEMENT: SHX6485

## 2018-10-02 LAB — SURGICAL PCR SCREEN
MRSA, PCR: NEGATIVE
Staphylococcus aureus: NEGATIVE

## 2018-10-02 LAB — POCT I-STAT, CHEM 8
BUN: 26 mg/dL — ABNORMAL HIGH (ref 8–23)
Calcium, Ion: 1.16 mmol/L (ref 1.15–1.40)
Chloride: 108 mmol/L (ref 98–111)
Creatinine, Ser: 1.7 mg/dL — ABNORMAL HIGH (ref 0.61–1.24)
Glucose, Bld: 88 mg/dL (ref 70–99)
HCT: 29 % — ABNORMAL LOW (ref 39.0–52.0)
Hemoglobin: 9.9 g/dL — ABNORMAL LOW (ref 13.0–17.0)
Potassium: 4.1 mmol/L (ref 3.5–5.1)
Sodium: 140 mmol/L (ref 135–145)
TCO2: 24 mmol/L (ref 22–32)

## 2018-10-02 LAB — SARS CORONAVIRUS 2 BY RT PCR (HOSPITAL ORDER, PERFORMED IN ~~LOC~~ HOSPITAL LAB): SARS Coronavirus 2: NEGATIVE

## 2018-10-02 SURGERY — ORAL EXCISION OF SCAR CONTRACTURE AND TISSUE ADVANCEMENT
Anesthesia: General | Site: Mouth

## 2018-10-02 MED ORDER — SODIUM CHLORIDE 0.9 % IV SOLN
INTRAVENOUS | Status: DC
  Administered 2018-10-02: 11:00:00 via INTRAVENOUS

## 2018-10-02 MED ORDER — SODIUM CHLORIDE 0.9% FLUSH: 3.0000 mL | INTRAVENOUS | Status: DC | PRN

## 2018-10-02 MED ORDER — LIDOCAINE-EPINEPHRINE 1 %-1:100000 IJ SOLN
INTRAMUSCULAR | Status: AC
  Filled 2018-10-02: qty 1

## 2018-10-02 MED ORDER — BUPIVACAINE-EPINEPHRINE (PF) 0.25% -1:200000 IJ SOLN
INTRAMUSCULAR | Status: AC
  Filled 2018-10-02: qty 30

## 2018-10-02 MED ORDER — OXYCODONE HCL 5 MG/5ML PO SOLN: 5.0000 mg | Freq: Once | ORAL | Status: DC | PRN

## 2018-10-02 MED ORDER — LIDOCAINE-EPINEPHRINE 1 %-1:100000 IJ SOLN
INTRAMUSCULAR | Status: DC | PRN
  Administered 2018-10-02: 1 mL

## 2018-10-02 MED ORDER — ACETAMINOPHEN 500 MG PO TABS: 1000.0000 mg | ORAL_TABLET | Freq: Once | ORAL | Status: DC | PRN

## 2018-10-02 MED ORDER — FENTANYL CITRATE (PF) 100 MCG/2ML IJ SOLN
INTRAMUSCULAR | Status: AC
  Filled 2018-10-02: qty 2

## 2018-10-02 MED ORDER — OXYCODONE HCL 5 MG PO TABS: 5.0000 mg | ORAL_TABLET | Freq: Once | ORAL | Status: DC | PRN

## 2018-10-02 MED ORDER — ACETAMINOPHEN 160 MG/5ML PO SOLN: 1000.0000 mg | Freq: Once | ORAL | Status: DC | PRN

## 2018-10-02 MED ORDER — FENTANYL CITRATE (PF) 250 MCG/5ML IJ SOLN
INTRAMUSCULAR | Status: AC
  Filled 2018-10-02: qty 5

## 2018-10-02 MED ORDER — FENTANYL CITRATE (PF) 100 MCG/2ML IJ SOLN
25.0000 ug | INTRAMUSCULAR | Status: DC | PRN
  Administered 2018-10-02 (×3): 25 ug via INTRAVENOUS

## 2018-10-02 MED ORDER — FENTANYL CITRATE (PF) 100 MCG/2ML IJ SOLN
INTRAMUSCULAR | Status: AC
  Administered 2018-10-02: 50 ug
  Filled 2018-10-02: qty 2

## 2018-10-02 MED ORDER — ACETAMINOPHEN 10 MG/ML IV SOLN
INTRAVENOUS | Status: AC
  Filled 2018-10-02: qty 100

## 2018-10-02 MED ORDER — OXYCODONE HCL 5 MG PO TABS: 5.0000 mg | ORAL_TABLET | ORAL | Status: DC | PRN

## 2018-10-02 MED ORDER — SODIUM CHLORIDE 0.9% FLUSH: 3.0000 mL | Freq: Two times a day (BID) | INTRAVENOUS | Status: DC

## 2018-10-02 MED ORDER — MUPIROCIN 2 % EX OINT
TOPICAL_OINTMENT | CUTANEOUS | Status: AC
  Filled 2018-10-02: qty 22

## 2018-10-02 MED ORDER — ACETAMINOPHEN 10 MG/ML IV SOLN
1000.0000 mg | Freq: Once | INTRAVENOUS | Status: DC | PRN
  Administered 2018-10-02: 1000 mg via INTRAVENOUS

## 2018-10-02 MED ORDER — SODIUM CHLORIDE 0.9 % IV SOLN: 250.0000 mL | INTRAVENOUS | Status: DC | PRN

## 2018-10-02 MED ORDER — ACETAMINOPHEN 325 MG PO TABS: 650.0000 mg | ORAL_TABLET | ORAL | Status: DC | PRN

## 2018-10-02 MED ORDER — FENTANYL CITRATE (PF) 250 MCG/5ML IJ SOLN
INTRAMUSCULAR | Status: DC | PRN
  Administered 2018-10-02 (×2): 25 ug via INTRAVENOUS

## 2018-10-02 MED ORDER — ACETAMINOPHEN 650 MG RE SUPP: 650.0000 mg | RECTAL | Status: DC | PRN

## 2018-10-02 MED ORDER — PROPOFOL 10 MG/ML IV BOLUS
INTRAVENOUS | Status: AC
  Filled 2018-10-02: qty 20

## 2018-10-02 MED ORDER — ONDANSETRON HCL 4 MG/2ML IJ SOLN
INTRAMUSCULAR | Status: DC | PRN
  Administered 2018-10-02: 4 mg via INTRAVENOUS

## 2018-10-02 MED ORDER — CIPROFLOXACIN IN D5W 400 MG/200ML IV SOLN
400.0000 mg | INTRAVENOUS | Status: AC
  Administered 2018-10-02: 400 mg via INTRAVENOUS

## 2018-10-02 MED ORDER — CHLORHEXIDINE GLUCONATE 4 % EX LIQD: 1.0000 "application " | Freq: Once | CUTANEOUS | Status: DC

## 2018-10-02 MED ORDER — CIPROFLOXACIN IN D5W 400 MG/200ML IV SOLN
INTRAVENOUS | Status: AC
  Filled 2018-10-02: qty 200

## 2018-10-02 MED ORDER — PROPOFOL 10 MG/ML IV BOLUS
INTRAVENOUS | Status: DC | PRN
  Administered 2018-10-02: 110 mg via INTRAVENOUS

## 2018-10-02 SURGICAL SUPPLY — 42 items
BLADE SURG 11 STRL SS (BLADE) ×4 IMPLANT
BLADE SURG 15 STRL LF DISP TIS (BLADE) ×2 IMPLANT
BLADE SURG 15 STRL SS (BLADE) ×2
CANISTER SUCT 3000ML PPV (MISCELLANEOUS) ×4 IMPLANT
COVER SURGICAL LIGHT HANDLE (MISCELLANEOUS) ×4 IMPLANT
COVER WAND RF STERILE (DRAPES) ×4 IMPLANT
ELECT COATED BLADE 2.86 ST (ELECTRODE) ×2 IMPLANT
ELECT NDL BLADE 2-5/6 (NEEDLE) ×2 IMPLANT
ELECT NEEDLE BLADE 2-5/6 (NEEDLE) ×4 IMPLANT
ELECT REM PT RETURN 9FT ADLT (ELECTROSURGICAL) ×4
ELECT REM PT RETURN 9FT NEONAT (ELECTRODE) IMPLANT
ELECT REM PT RETURN 9FT PED (ELECTROSURGICAL)
ELECTRODE REM PT RETRN 9FT PED (ELECTROSURGICAL) IMPLANT
ELECTRODE REM PT RTRN 9FT ADLT (ELECTROSURGICAL) IMPLANT
GLOVE BIO SURGEON STRL SZ 6.5 (GLOVE) ×6 IMPLANT
GLOVE BIO SURGEONS STRL SZ 6.5 (GLOVE) ×2
GOWN STRL REUS W/ TWL LRG LVL3 (GOWN DISPOSABLE) ×6 IMPLANT
GOWN STRL REUS W/TWL LRG LVL3 (GOWN DISPOSABLE) ×6
HOLDER TRACH TUBE VELCRO 19.5 (MISCELLANEOUS) ×2 IMPLANT
KIT BASIN OR (CUSTOM PROCEDURE TRAY) ×4 IMPLANT
KIT TURNOVER KIT B (KITS) ×4 IMPLANT
NDL HYPO 25GX1X1/2 BEV (NEEDLE) IMPLANT
NDL HYPO 30X.5 LL (NEEDLE) ×2 IMPLANT
NEEDLE HYPO 25GX1X1/2 BEV (NEEDLE) ×4 IMPLANT
NEEDLE HYPO 30X.5 LL (NEEDLE) ×4 IMPLANT
NS IRRIG 1000ML POUR BTL (IV SOLUTION) ×4 IMPLANT
PAD ARMBOARD 7.5X6 YLW CONV (MISCELLANEOUS) ×2 IMPLANT
PENCIL BUTTON HOLSTER BLD 10FT (ELECTRODE) ×4 IMPLANT
SUT CHROMIC 4 0 PS 2 18 (SUTURE) ×2 IMPLANT
SUT CHROMIC 5 0 RB 1 27 (SUTURE) ×2 IMPLANT
SUT PROLENE 6 0 PC 1 (SUTURE) IMPLANT
SUT SILK 6 0 P 1 (SUTURE) IMPLANT
SUT VIC AB 4-0 P-3 18X BRD (SUTURE) IMPLANT
SUT VIC AB 4-0 P-3 18XBRD (SUTURE) IMPLANT
SUT VIC AB 4-0 P3 18 (SUTURE) ×2
SUT VIC AB 4-0 PS2 27 (SUTURE) ×4 IMPLANT
SUT VIC AB 5-0 P-3 18XBRD (SUTURE) IMPLANT
SUT VIC AB 5-0 P3 18 (SUTURE)
SUT VICRYL 6 0 P 1 18 (SUTURE) ×2 IMPLANT
TOWEL GREEN STERILE (TOWEL DISPOSABLE) IMPLANT
TOWEL GREEN STERILE FF (TOWEL DISPOSABLE) IMPLANT
TRAY ENT MC OR (CUSTOM PROCEDURE TRAY) ×4 IMPLANT

## 2018-10-02 NOTE — Discharge Instructions (Signed)
[place] to the area several times a day.  May eat as able. Avoid anything hot for 12 hours. Continue speech therapy. Strict oral hygiene

## 2018-10-02 NOTE — Transfer of Care (Signed)
Immediate Anesthesia Transfer of Care Note  Patient: Anthony [name]  Procedure(s) Performed: ORAL EXCISION OF SCAR CONTRACTURE AND TISSUE ADVANCEMENT (N/A Mouth) Incision And Drainage/ Cleaning of Lip Flap with Inset of lip Flap (Bilateral Mouth)  Patient Location: PACU  Anesthesia Type:General  Level of Consciousness: awake, alert  and patient cooperative  Airway & Oxygen Therapy: Patient Spontanous Breathing and Patient connected to face mask oxygen  Post-op Assessment: Report given to RN, Post -op Vital signs reviewed and stable and Patient moving all extremities  Post vital signs: Reviewed and stable  Last Vitals:  Vitals Value Taken Time  BP 117/70 10/02/18 1422  Temp 36.4 C 10/02/18 1422  Pulse 60 10/02/18 1431  Resp 18 10/02/18 1431  SpO2 100 % 10/02/18 1431  Vitals shown include unvalidated device data.  Last Pain:  Vitals:   10/02/18 1422  TempSrc:   PainSc: 10-Worst pain ever      Patients Stated Pain Goal: 3 (01/59/96 8957)  Complications: No apparent anesthesia complications

## 2018-10-02 NOTE — Anesthesia Preprocedure Evaluation (Signed)
Anesthesia Evaluation  Patient identified by MRN, date of birth, ID band Patient awake    Reviewed: Allergy & Precautions, NPO status , Patient's Chart, lab work & pertinent test results  Airway Mallampati: Trach       Dental   Pulmonary former smoker,     + decreased breath sounds      Cardiovascular hypertension,  Rhythm:Regular Rate:Normal     Neuro/Psych    GI/Hepatic   Endo/Other    Renal/GU      Musculoskeletal   Abdominal   Peds  Hematology   Anesthesia Other Findings   Reproductive/Obstetrics                             Anesthesia Physical Anesthesia Plan  ASA: III  Anesthesia Plan: General   Post-op Pain Management:    Induction: Intravenous  PONV Risk Score and Plan:   Airway Management Planned: Tracheostomy  Additional Equipment:   Intra-op Plan:   Post-operative Plan:   Informed Consent: I have reviewed the patients History and Physical, chart, labs and discussed the procedure including the risks, benefits and alternatives for the proposed anesthesia with the patient or authorized representative who has indicated his/her understanding and acceptance.       Plan Discussed with: CRNA and Anesthesiologist  Anesthesia Plan Comments:         Anesthesia Quick Evaluation

## 2018-10-02 NOTE — H&P (Signed)
Anthony [name] is an 78 y.o. male.   Chief Complaint: lip defect from cancer HPI: The patient is a 78 year old male here for secondary procedure for insetting of his lip flap.  It was dislodged about a week after the surgery.  We will see if we can inset it and salvage any remaining portion.  Past Medical History:  Diagnosis Date  . Acute pyelonephritis 12/23/2014  . Arthritis   . Asthma   . BPH (benign prostatic hyperplasia)   . Cancer ([place])    Lip cancer,has a place on the top of head  . Chronic back pain   . Chronic hip pain   . CKD (chronic kidney disease)    Stage 3  . COPD (chronic obstructive pulmonary disease) ([place])   . Dementia ([place])   . Depression   . Dysphagia   . GERD (gastroesophageal reflux disease)   . Headache   . History of kidney stones   . Hypertension    no longer on medication  . Kidney stone   . Neuropathy   . Pneumonia    november 2019  bilateral  . PTSD (post-traumatic stress disorder)   . Pulmonary nodules 12/25/2014  . Restless leg syndrome     Past Surgical History:  Procedure Laterality Date  . ABDOMINAL SURGERY     heria  . APPLICATION OF A-CELL OF HEAD/NECK N/A 09/18/2018   Procedure: Preperation of scalp wound for placement of A-Cell;  Surgeon: Wallace Going, DO;  Location: [place];  Service: Plastics;  Laterality: N/A;  . CLEFT LIP REPAIR N/A 09/18/2018   Procedure: Reconstruction of upper and lower lip defect;  Surgeon: Wallace Going, DO;  Location: [place];  Service: Plastics;  Laterality: N/A;  Total surgery time 2 hours  . ESOPHAGOGASTRODUODENOSCOPY N/A 07/06/2017   Procedure: ESOPHAGOGASTRODUODENOSCOPY (EGD);  Surgeon: Rogene Houston, MD;  [facility];  Service: Endoscopy;  Laterality: N/A;  . EYE SURGERY    . FRACTURE SURGERY  2008   hip fracture with nailing  . HERNIA REPAIR    . IR CATHETER TUBE CHANGE  01/26/2018  . LAPAROSCOPIC GASTROSTOMY N/A 04/21/2018   Procedure: LAPAROSCOPIC GASTROSTOMY WITH G-TUBE  PLACEMENT;  Surgeon: Kinsinger, Arta Bruce, MD;  Location: [place];  Service: General;  Laterality: N/A;  . LAPAROSCOPIC GASTROSTOMY N/A 04/20/2018   Procedure: ATTEMPTED ENDOSCOPIC GASTROSTOMY;  Surgeon: Kieth Brightly Arta Bruce, MD;  Location: [place];  Service: General;  Laterality: N/A;  . LESION EXCISION WITH COMPLEX REPAIR N/A 04/12/2018   Procedure: TOTAL LOWER LIP RESECTION;  Surgeon: Melida Quitter, MD;  Location: [place];  Service: ENT;  Laterality: N/A;  . NASAL FLAP ROTATION N/A 04/20/2018   Procedure: REVISION LOWER LIP FLAP AND SCALP FLAP;  Surgeon: Wallace Going, DO;  Location: [place];  Service: Plastics;  Laterality: N/A;  . RADICAL NECK DISSECTION Bilateral 04/12/2018   Procedure: Bilateral Radical Neck Dissection;  Surgeon: Melida Quitter, MD;  Location: [place];  Service: ENT;  Laterality: Bilateral;  . SCALP LACERATION REPAIR N/A 04/20/2018   Procedure: WIDE SCALP EXCISION SKIN CANCER X 7 CM;  Surgeon: Melida Quitter, MD;  Location: [place];  Service: ENT;  Laterality: N/A;  . Superubic Catheter    . supubic catheter    . TRACHEOSTOMY TUBE PLACEMENT N/A 04/12/2018   Procedure: TRACHEOSTOMY;  Surgeon: Melida Quitter, MD;  [facility];  Service: ENT;  Laterality: N/A;    Family History  Family history unknown: Yes   Social History:  reports that he quit smoking about 15 months ago. His smoking use included cigarettes. He smoked 0.50 packs per day. He has never used smokeless tobacco. He reports that he does not drink alcohol or use drugs.  Allergies:  Allergies  Allergen Reactions  . Bee Venom Anaphylaxis  . Penicillins Hives    Has patient had a PCN reaction causing immediate rash, facial/tongue/throat swelling, SOB or lightheadedness with hypotension: NO Has patient had a PCN reaction causing severe rash involving mucus membranes or skin necrosis: NO Has patient had a PCN reaction that required hospitalization: NO Has patient had a PCN reaction occurring within the last 10 years:  NO If all of the above answers are "NO", then may proceed with Cephalosporin use.   . Strawberry Extract Rash    Medications Prior to Admission  Medication Sig Dispense Refill  . acetaminophen (TYLENOL) 325 MG tablet Take 650 mg by mouth 4 (four) times daily.    Marland Kitchen albuterol (PROVENTIL HFA;VENTOLIN HFA) 108 (90 BASE) MCG/ACT inhaler Inhale 2 puffs into the lungs daily as needed for wheezing or shortness of breath.     . Ascorbic Acid (VITAMIN C PO) Take 1 tablet by mouth daily.    Marland Kitchen gabapentin (NEURONTIN) 100 MG capsule Take 200 mg by mouth 3 (three) times daily. (0900, 1700, & 2100)    . ipratropium-albuterol (DUONEB) 0.5-2.5 (3) MG/3ML SOLN Take 3 mLs by nebulization every 6 (six) hours as needed (shortness of breath).    . magnesium hydroxide (MILK OF MAGNESIA) 400 MG/5ML suspension Take 30 mLs by mouth daily as needed for mild constipation.    Marland Kitchen MELATONIN PO Take 6 mg by mouth at bedtime.    . memantine (NAMENDA) 5 MG tablet Take 1 tablet (5 mg total) by mouth daily. (0900)    . Multiple Vitamin (MULTIVITAMIN WITH MINERALS) TABS tablet Take 1 tablet by mouth daily.    . Nutritional Supplement LIQD Take 1 Dose by mouth 3 (three) times daily. House 2.0 liquid supplement    . omeprazole (PRILOSEC) 40 MG capsule Take 40 mg by mouth every evening.    Marland Kitchen oxyCODONE (OXY IR/ROXICODONE) 5 MG immediate release tablet Take 5 mg by mouth every 4 (four) hours as needed for severe pain.    . polyethylene glycol (MIRALAX) packet Take 17 g by mouth daily. 14 each 0  . pramipexole (MIRAPEX) 0.125 MG tablet Take 0.125 mg by mouth at bedtime. (2100)    . sertraline (ZOLOFT) 25 MG tablet Take 25 mg by mouth daily.    Marland Kitchen sulfamethoxazole-trimethoprim (BACTRIM DS) 800-160 MG tablet Take 1 tablet by mouth 2 (two) times daily.    . Water For Irrigation, Sterile (FREE WATER) SOLN Place 200 mLs into feeding tube every 8 (eight) hours. 1000 mL 0  . Zinc 50 MG TABS Take 50 mg by mouth daily.    . promethazine  (PHENERGAN) 25 MG suppository Place 25 mg rectally every 4 (four) hours as needed for nausea or vomiting.    . senna (SENOKOT) 8.6 MG TABS tablet Take 1 tablet by mouth daily as needed for mild constipation.      Results for orders placed or performed during the hospital encounter of 10/02/18 (from the past 48 hour(s))  SARS Coronavirus 2 (CEPHEID - Performed in [facility] hospital lab), [facility] Order     Status: None   Collection Time: 10/02/18 10:14 AM   Specimen: Nasopharyngeal Swab  Result Value Ref Range   SARS Coronavirus 2 NEGATIVE NEGATIVE  Comment: (NOTE) If result is NEGATIVE SARS-CoV-2 target nucleic acids are NOT DETECTED. The SARS-CoV-2 RNA is generally detectable in upper and lower  respiratory specimens during the acute phase of infection. The lowest  concentration of SARS-CoV-2 viral copies this assay can detect is 250  copies / mL. A negative result does not preclude SARS-CoV-2 infection  and should not be used as the sole basis for treatment or other  patient management decisions.  A negative result may occur with  improper specimen collection / handling, submission of specimen other  than nasopharyngeal swab, presence of viral mutation(s) within the  areas targeted by this assay, and inadequate number of viral copies  (<250 copies / mL). A negative result must be combined with clinical  observations, patient history, and epidemiological information. If result is POSITIVE SARS-CoV-2 target nucleic acids are DETECTED. The SARS-CoV-2 RNA is generally detectable in upper and lower  respiratory specimens dur ing the acute phase of infection.  Positive  results are indicative of active infection with SARS-CoV-2.  Clinical  correlation with patient history and other diagnostic information is  necessary to determine patient infection status.  Positive results do  not rule out bacterial infection or co-infection with other viruses. If result is PRESUMPTIVE  POSTIVE SARS-CoV-2 nucleic acids MAY BE PRESENT.   A presumptive positive result was obtained on the submitted specimen  and confirmed on repeat testing.  While 2019 novel coronavirus  (SARS-CoV-2) nucleic acids may be present in the submitted sample  additional confirmatory testing may be necessary for epidemiological  and / or clinical management purposes  to differentiate between  SARS-CoV-2 and other Sarbecovirus currently known to infect humans.  If clinically indicated additional testing with an alternate test  methodology [phone]) is advised. The SARS-CoV-2 RNA is generally  detectable in upper and lower respiratory sp ecimens during the acute  phase of infection. The expected result is Negative. Fact Sheet for Patients:  StrictlyIdeas.no Fact Sheet for Healthcare Providers: BankingDealers.co.za This test is not yet approved or cleared by the [place] FDA and has been authorized for detection and/or diagnosis of SARS-CoV-2 by FDA under an Emergency Use Authorization (EUA).  This EUA will remain in effect (meaning this test can be used) for the duration of the COVID-19 declaration under Section 564(b)(1) of the Act, 21 U.S.C. section 360bbb-3(b)(1), unless the authorization is terminated or revoked sooner. Performed at [facility], [place] [address] [place], [place] [address]    No results found.  Review of Systems  Constitutional: Negative for chills and fever.  HENT: Positive for hearing loss.   Eyes: Negative.   Respiratory: Negative for cough.   Cardiovascular: Negative.   Gastrointestinal: Negative.   Neurological: Positive for weakness.  Psychiatric/Behavioral: Positive for memory loss.    Blood pressure (!) 105/54, pulse (!) 58, temperature 97.8 F (36.6 C), temperature source Oral, resp. rate 18, height 6\' 3"  (1.905 m), weight 75.8 kg, SpO2 97 %. Physical Exam  Constitutional: He appears well-developed  and well-nourished.  HENT:  Head: Normocephalic.  Eyes: EOM are normal.  Cardiovascular: Normal rate.  Respiratory: Effort normal.  GI: Soft. He exhibits no distension.  Neurological: He is alert.  Skin: Skin is warm.  Psychiatric: He has a normal mood and affect.     Assessment/Plan Lip defect due to skin cancer plan on insetting the flap. Risks and complications were discussed.  [place], DO 10/02/2018, 12:02 PM

## 2018-10-02 NOTE — Op Note (Signed)
DATE OF OPERATION: 10/02/2018  LOCATION: [facility]  PREOPERATIVE DIAGNOSIS: Lower lip defect from skin cancer, s/p flap  POSTOPERATIVE DIAGNOSIS: Same  PROCEDURE: excision of upper lip flap and closure of lower lip 1 cm defect  SURGEON: Branton Einstein Sanger Romell Cavanah, DO  ASSISTANT: Software engineer, PA  EBL: < 5 cc  CONDITION: Stable  COMPLICATIONS: None  INDICATION: The patient, Anthony [name], is a 78 y.o. male born on [birth year], is here for treatment of a lower lip defect after resection of skin cancer.  He had a flap reconstruction which was torn after surgery.  He had a repair with an upper lip switch.  Unfortunately this was torn as well.   PROCEDURE DETAILS:  The patient was seen prior to surgery and marked.  The IV antibiotics were given. The patient was taken to the operating room and given a general anesthetic. A standard time out was performed and all information was confirmed by those in the room. SCDs were placed.   The face was prepped and draped.  The flap was completely disconnected.  This was excised and the upper lip closed with 4-0 Vicryl vertical mattress sutures.  The lower lip was re-approximated with the 4-0 Vicryl at the muscle level and skin 1 cm.  The patient was allowed to wake up and taken to recovery room in stable condition at the end of the case. The family was notified at the end of the case.   The advanced practice practitioner (APP) assisted throughout the case.  The APP was essential in retraction and counter traction when needed to make the case progress smoothly.  This retraction and assistance made it possible to see the tissue plans for the procedure.  The assistance was needed for blood control, tissue re-approximation and assisted with closure of the incision site.

## 2018-10-02 NOTE — Anesthesia Procedure Notes (Signed)
Date/Time: 10/02/2018 1:18 PM Performed by: Myna Bright, CRNA Pre-anesthesia Checklist: Patient identified, Emergency Drugs available, Suction available and Patient being monitored Patient Re-evaluated:Patient Re-evaluated prior to induction Oxygen Delivery Method: Circle system utilized Preoxygenation: Pre-oxygenation with 100% oxygen Induction Type: IV induction and Tracheostomy Tube type: Reinforced Tube size: 6.0 mm Number of attempts: 1 Placement Confirmation: positive ETCO2 and breath sounds checked- equal and bilateral Comments: Patient with uncuffed 6.0 Shiley trach. Smooth IV induction after preoxygenation. Removed trach and replaced with 6.0 reinforced ETT through stoma without resistance. +EtCO2, +BBS. VSS.

## 2018-10-03 ENCOUNTER — Encounter (HOSPITAL_COMMUNITY): Payer: Self-pay | Admitting: Plastic Surgery

## 2018-10-03 DIAGNOSIS — M6281 Muscle weakness (generalized): Secondary | ICD-10-CM | POA: Diagnosis not present

## 2018-10-03 DIAGNOSIS — R1311 Dysphagia, oral phase: Secondary | ICD-10-CM | POA: Diagnosis not present

## 2018-10-03 DIAGNOSIS — C001 Malignant neoplasm of external lower lip: Secondary | ICD-10-CM | POA: Diagnosis not present

## 2018-10-03 DIAGNOSIS — C4440 Unspecified malignant neoplasm of skin of scalp and neck: Secondary | ICD-10-CM | POA: Diagnosis not present

## 2018-10-03 DIAGNOSIS — R41841 Cognitive communication deficit: Secondary | ICD-10-CM | POA: Diagnosis not present

## 2018-10-03 DIAGNOSIS — C4402 Squamous cell carcinoma of skin of lip: Secondary | ICD-10-CM | POA: Diagnosis not present

## 2018-10-03 DIAGNOSIS — R498 Other voice and resonance disorders: Secondary | ICD-10-CM | POA: Diagnosis not present

## 2018-10-03 DIAGNOSIS — R1314 Dysphagia, pharyngoesophageal phase: Secondary | ICD-10-CM | POA: Diagnosis not present

## 2018-10-03 DIAGNOSIS — E43 Unspecified severe protein-calorie malnutrition: Secondary | ICD-10-CM | POA: Diagnosis not present

## 2018-10-03 DIAGNOSIS — J449 Chronic obstructive pulmonary disease, unspecified: Secondary | ICD-10-CM | POA: Diagnosis not present

## 2018-10-04 DIAGNOSIS — R1314 Dysphagia, pharyngoesophageal phase: Secondary | ICD-10-CM | POA: Diagnosis not present

## 2018-10-04 DIAGNOSIS — M6281 Muscle weakness (generalized): Secondary | ICD-10-CM | POA: Diagnosis not present

## 2018-10-04 DIAGNOSIS — R1311 Dysphagia, oral phase: Secondary | ICD-10-CM | POA: Diagnosis not present

## 2018-10-04 DIAGNOSIS — N183 Chronic kidney disease, stage 3 (moderate): Secondary | ICD-10-CM | POA: Diagnosis not present

## 2018-10-04 DIAGNOSIS — C001 Malignant neoplasm of external lower lip: Secondary | ICD-10-CM | POA: Diagnosis not present

## 2018-10-04 DIAGNOSIS — J449 Chronic obstructive pulmonary disease, unspecified: Secondary | ICD-10-CM | POA: Diagnosis not present

## 2018-10-04 DIAGNOSIS — R498 Other voice and resonance disorders: Secondary | ICD-10-CM | POA: Diagnosis not present

## 2018-10-04 DIAGNOSIS — R41841 Cognitive communication deficit: Secondary | ICD-10-CM | POA: Diagnosis not present

## 2018-10-04 NOTE — Anesthesia Postprocedure Evaluation (Signed)
Anesthesia Post Note  Patient: Anthony [name]  Procedure(s) Performed: ORAL EXCISION OF SCAR CONTRACTURE AND TISSUE ADVANCEMENT (N/A Mouth) Incision And Drainage/ Cleaning of Lip Flap with Inset of lip Flap (Bilateral Mouth)     Anesthesia Type: General Level of consciousness: patient cooperative Pain management: pain level controlled Vital Signs Assessment: post-procedure vital signs reviewed and stable Respiratory status: spontaneous breathing, nonlabored ventilation, respiratory function stable and patient connected to tracheostomy mask oxygen Cardiovascular status: blood pressure returned to baseline and stable Postop Assessment: no apparent nausea or vomiting Anesthetic complications: no    Last Vitals:  Vitals:   10/02/18 1615 10/02/18 1715  BP: 113/70 (!) 123/53  Pulse: (!) 52 (!) 50  Resp: 17 16  Temp:    SpO2: 98% 94%    Last Pain:  Vitals:   10/02/18 1715  TempSrc:   PainSc: Asleep                 [name]

## 2018-10-05 DIAGNOSIS — M6281 Muscle weakness (generalized): Secondary | ICD-10-CM | POA: Diagnosis not present

## 2018-10-05 DIAGNOSIS — R1311 Dysphagia, oral phase: Secondary | ICD-10-CM | POA: Diagnosis not present

## 2018-10-05 DIAGNOSIS — R41841 Cognitive communication deficit: Secondary | ICD-10-CM | POA: Diagnosis not present

## 2018-10-05 DIAGNOSIS — C001 Malignant neoplasm of external lower lip: Secondary | ICD-10-CM | POA: Diagnosis not present

## 2018-10-05 DIAGNOSIS — R1314 Dysphagia, pharyngoesophageal phase: Secondary | ICD-10-CM | POA: Diagnosis not present

## 2018-10-05 DIAGNOSIS — R498 Other voice and resonance disorders: Secondary | ICD-10-CM | POA: Diagnosis not present

## 2018-10-06 DIAGNOSIS — R1314 Dysphagia, pharyngoesophageal phase: Secondary | ICD-10-CM | POA: Diagnosis not present

## 2018-10-06 DIAGNOSIS — R41841 Cognitive communication deficit: Secondary | ICD-10-CM | POA: Diagnosis not present

## 2018-10-06 DIAGNOSIS — M6281 Muscle weakness (generalized): Secondary | ICD-10-CM | POA: Diagnosis not present

## 2018-10-06 DIAGNOSIS — R1311 Dysphagia, oral phase: Secondary | ICD-10-CM | POA: Diagnosis not present

## 2018-10-06 DIAGNOSIS — R498 Other voice and resonance disorders: Secondary | ICD-10-CM | POA: Diagnosis not present

## 2018-10-06 DIAGNOSIS — C001 Malignant neoplasm of external lower lip: Secondary | ICD-10-CM | POA: Diagnosis not present

## 2018-10-08 IMAGING — CT CT HEAD W/O CM
4 series · 16 of 47 positions shown, 18 images · non-contrast
Comparison: None.

CLINICAL DATA: Recent head trauma

EXAM:
CT HEAD WITHOUT CONTRAST
TECHNIQUE: Contiguous axial images were obtained from the base of the skull
through the vertex without intravenous contrast.

[Series 2: head trauma wo · axial · 0.48mm/px · z∈[+83,+208]mm · 7 of 35 slices shown, 9 images]
[im 5/35  brain]
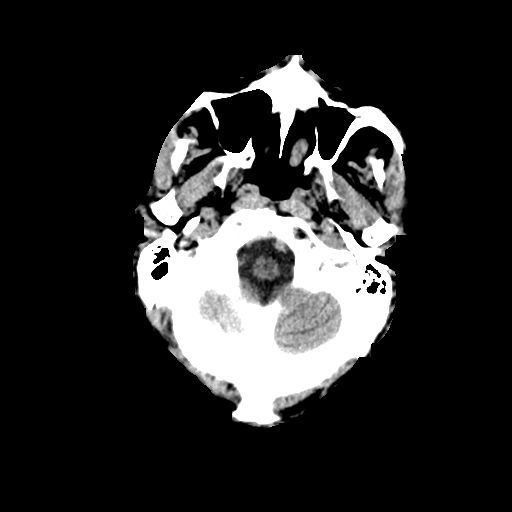
[im 5/35  bone]
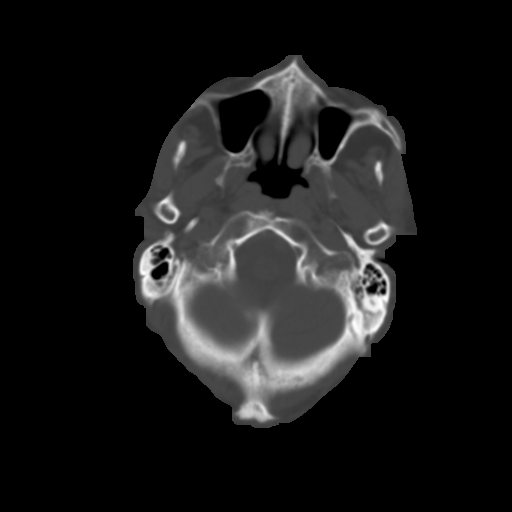
[im 9/35  brain]
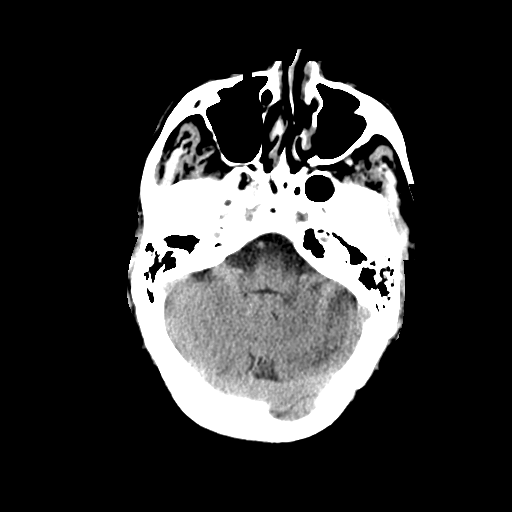
[im 13/35  brain]
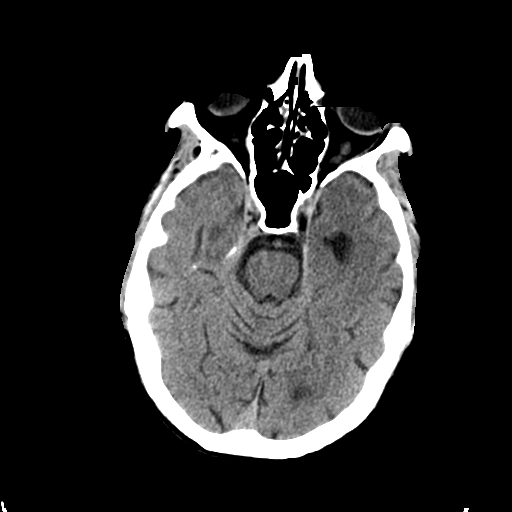
[im 18/35  brain]
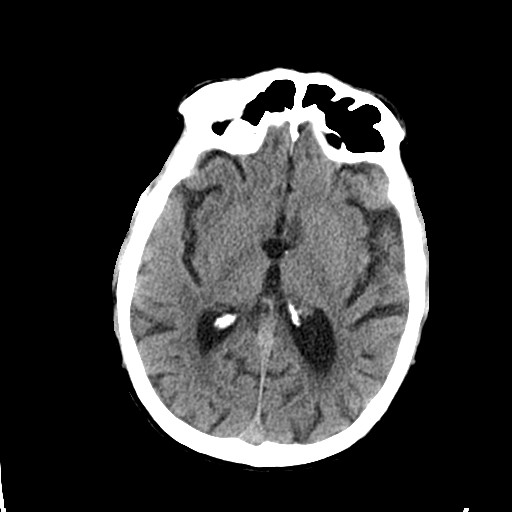
[im 22/35  brain]
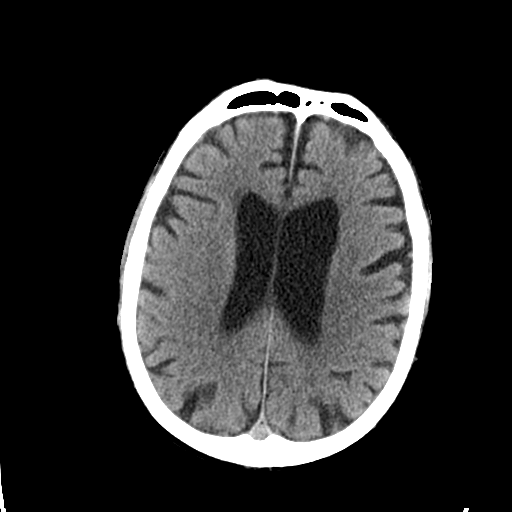
[im 22/35  bone]
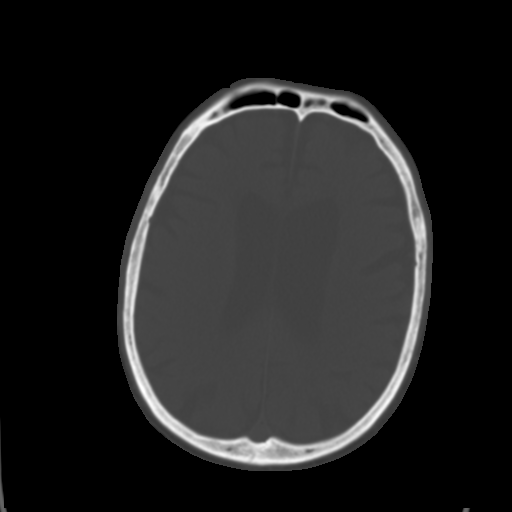
[im 26/35  brain]
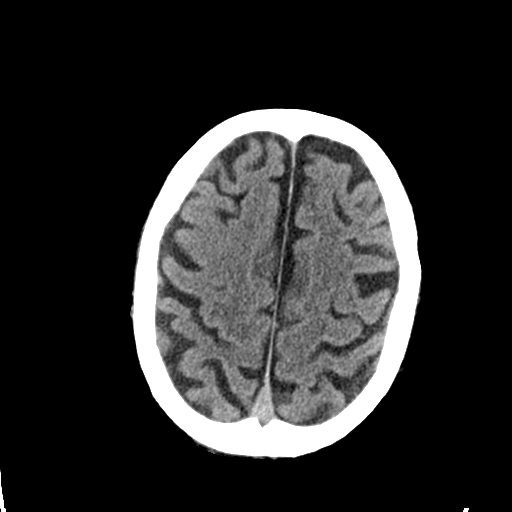
[im 30/35  brain]
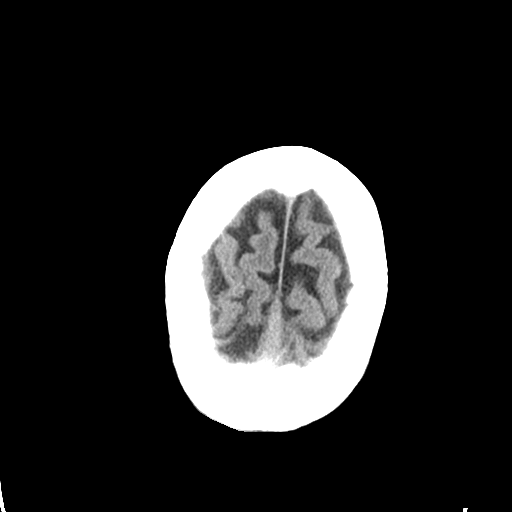

[Series 3: head bone · axial · 0.48mm/px · z∈[+79,+113]mm · 3 of 87 slices shown]
[im 9/87  bone]
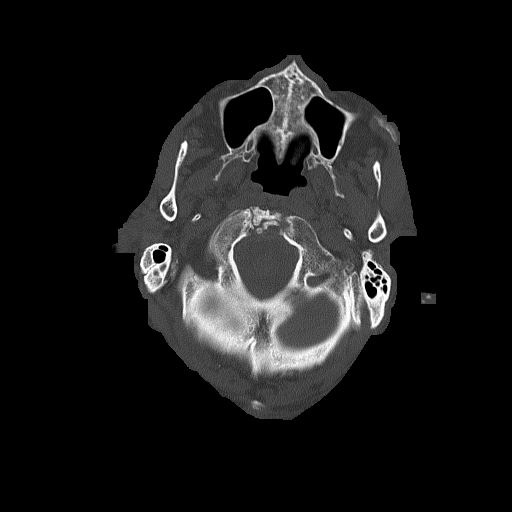
[im 18/87  bone]
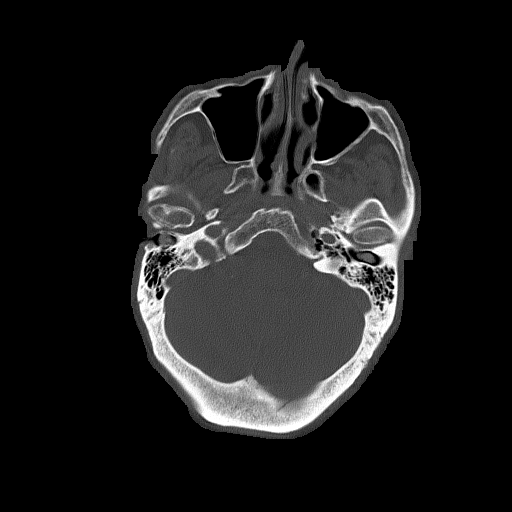
[im 26/87  bone]
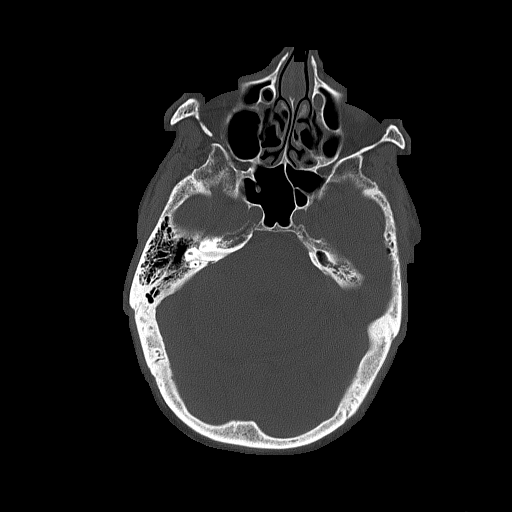

[Series 4: coronal soft tissue · coronal · 0.34mm/px · 3 of 71 slices shown]
[im 24/71  brain]
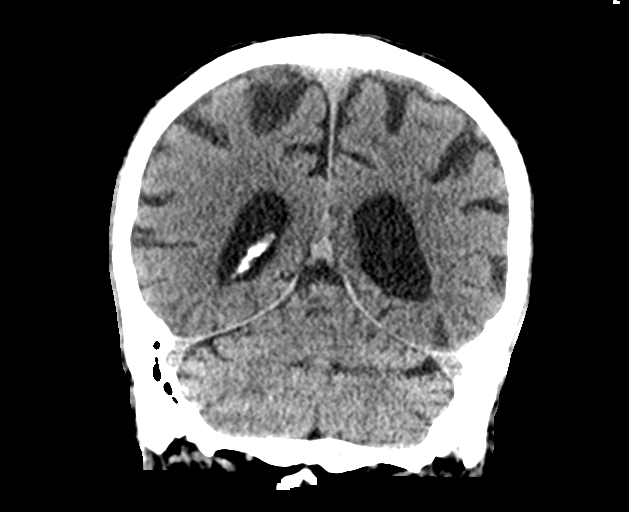
[im 32/71  brain]
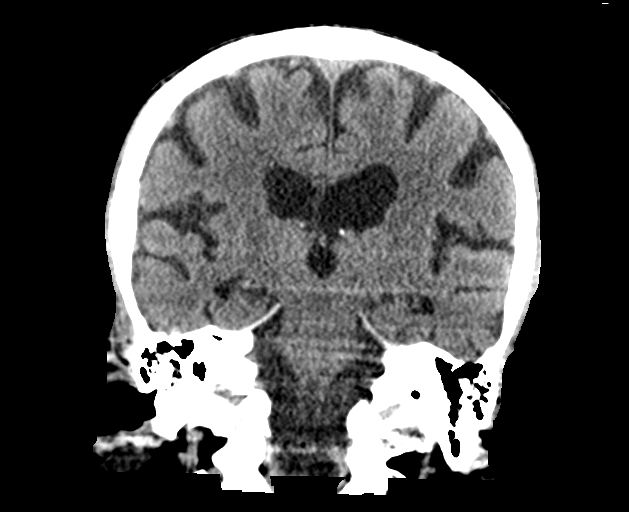
[im 39/71  brain]
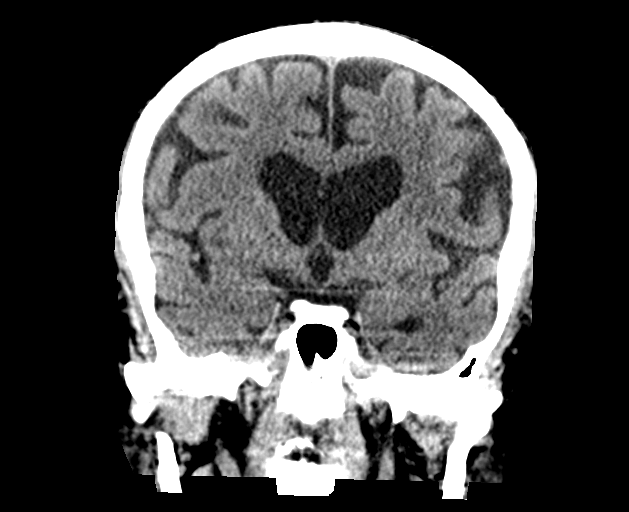

[Series 5: sagittal soft tissue · sagittal · 0.35mm/px · 3 of 56 slices shown]
[im 19/56  brain]
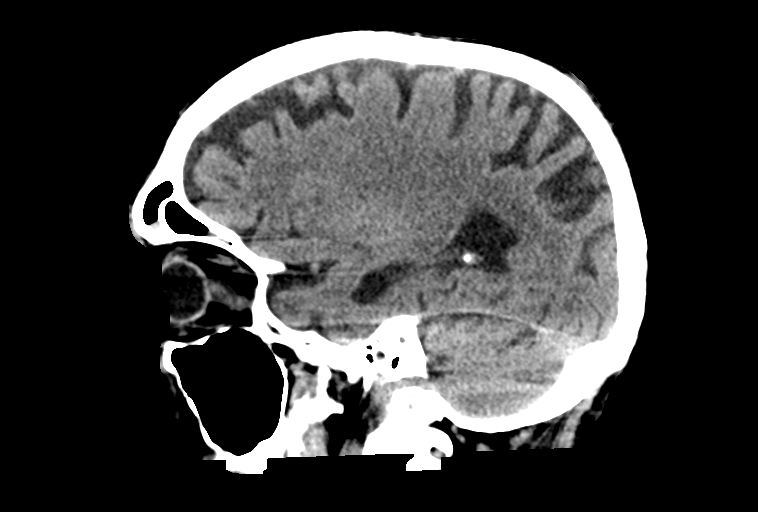
[im 28/56  brain]
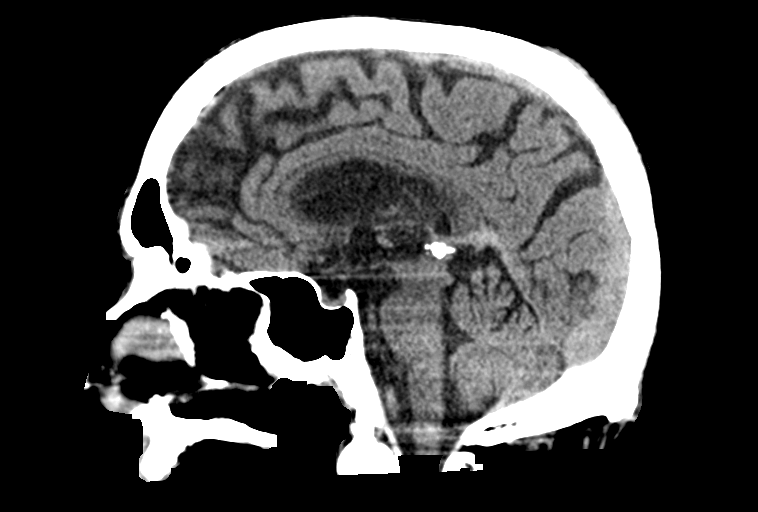
[im 37/56  brain]
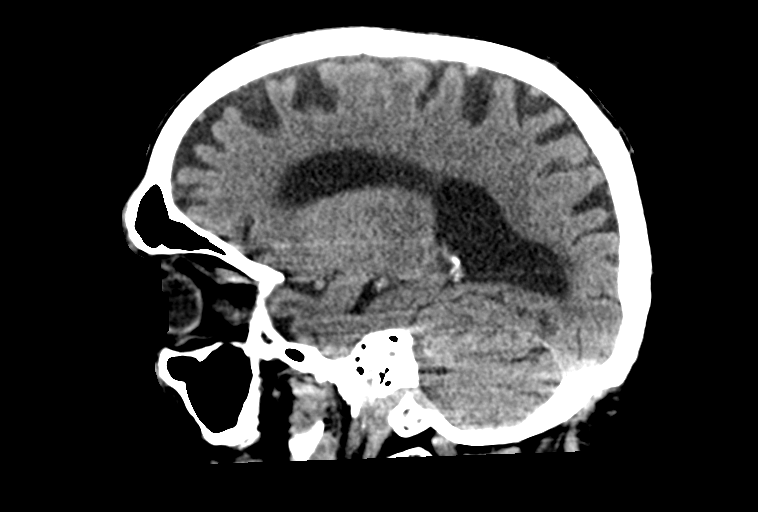

[16 of 47 positions shown; findings below may reference images not displayed]

FINDINGS: Brain: Chronic atrophic changes are noted. No findings to suggest
acute hemorrhage, acute infarction or space-occupying mass lesion
are noted.

Vascular: No hyperdense vessel or unexpected calcification.

Skull: Normal. Negative for fracture or focal lesion.

Sinuses/Orbits: No acute finding.

Other: None.
IMPRESSION: No acute intracranial abnormality noted. Chronic atrophic changes
are seen.

## 2018-10-08 IMAGING — DX DG HIP (WITH OR WITHOUT PELVIS) 5+V BILAT
6 series · 6 of 6 positions shown · non-contrast
Comparison: 06/10/2017

CLINICAL DATA: Recent fall with leg pain, initial encounter

EXAM:
DG HIP (WITH OR WITHOUT PELVIS) 5+V BILAT

[pelvis ap]
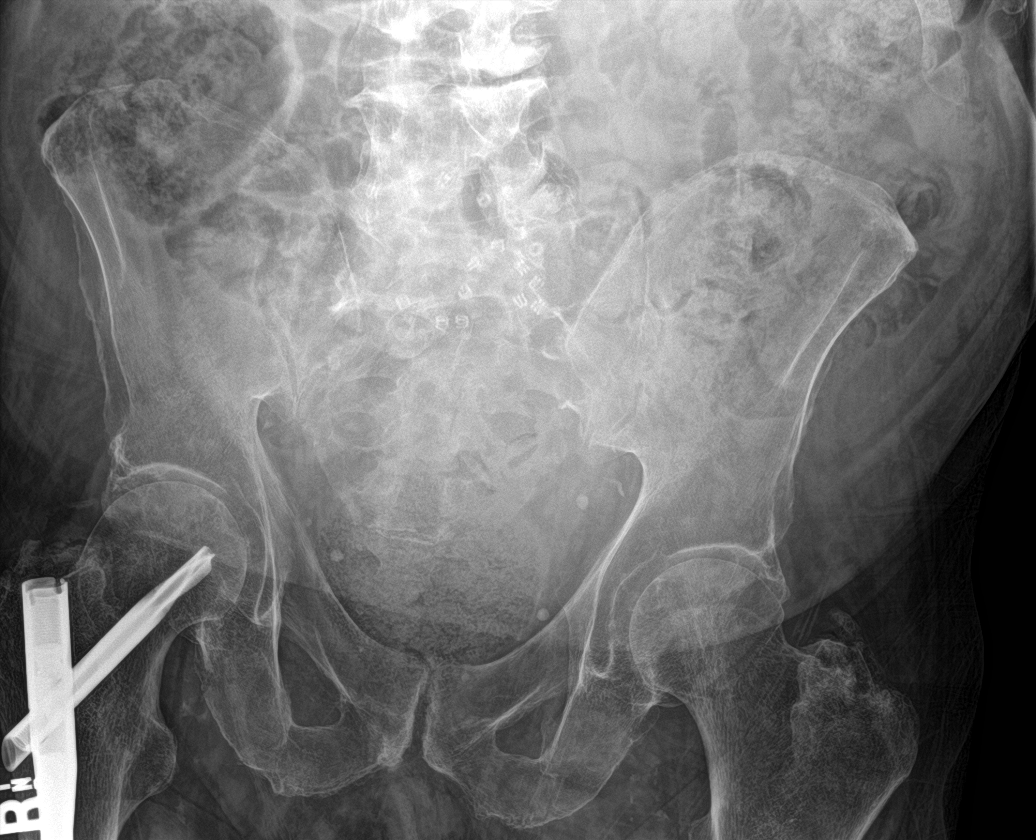

[hip ap (1 of 2)]
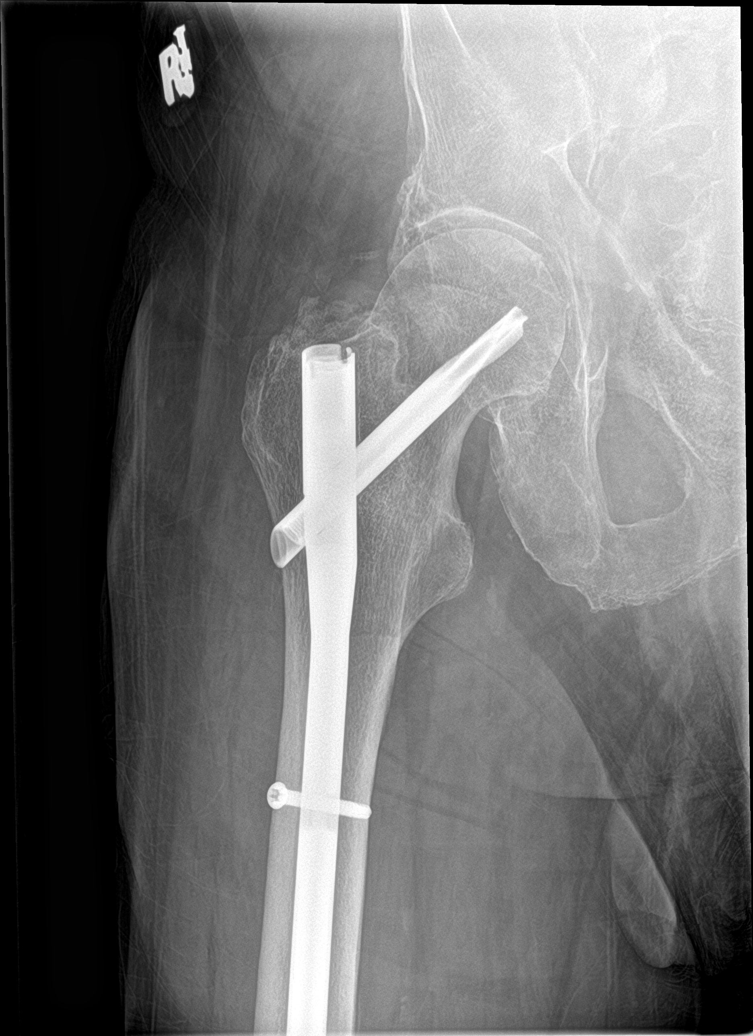

[hip lat (1 of 3)]
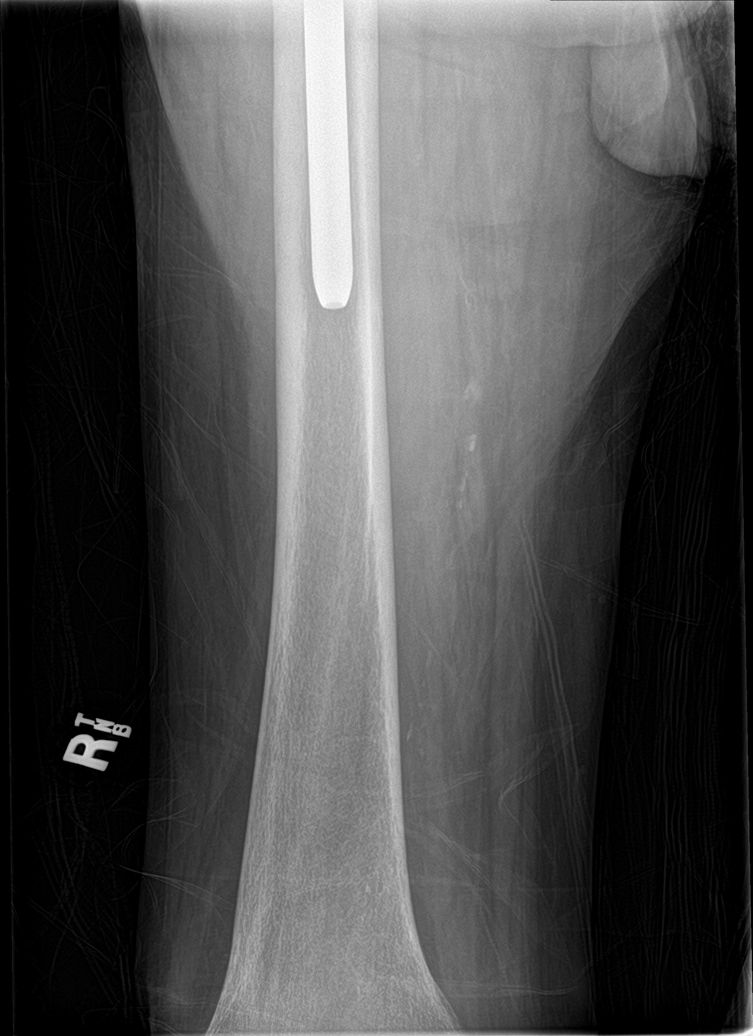

[hip ap (2 of 2)]
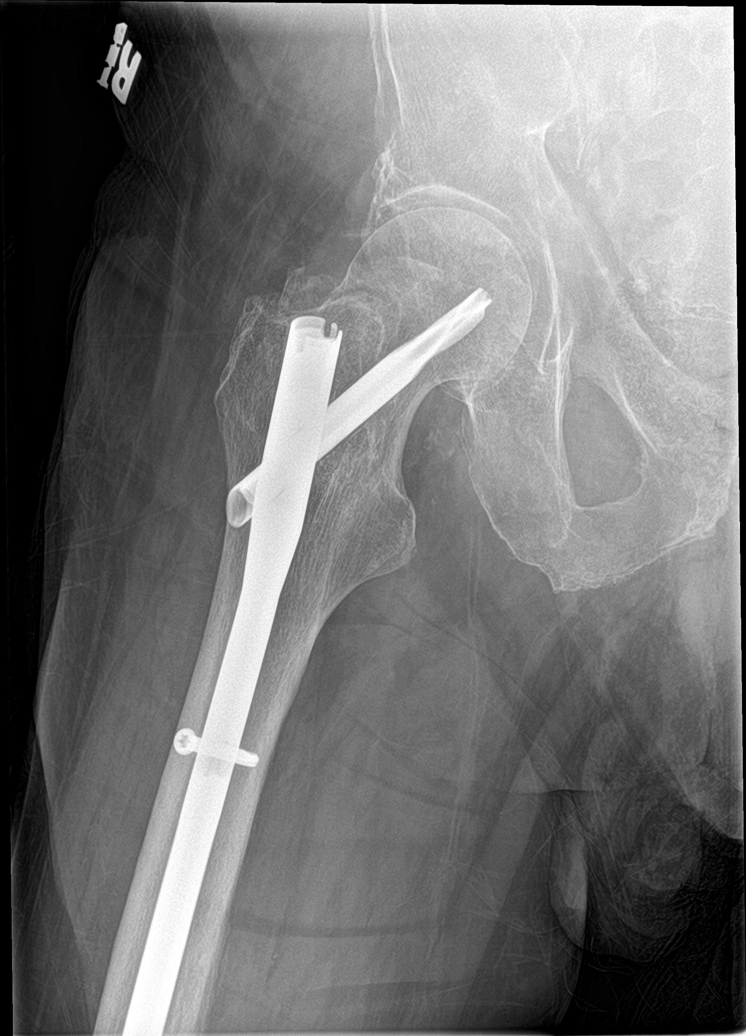

[hip lat (2 of 3)]
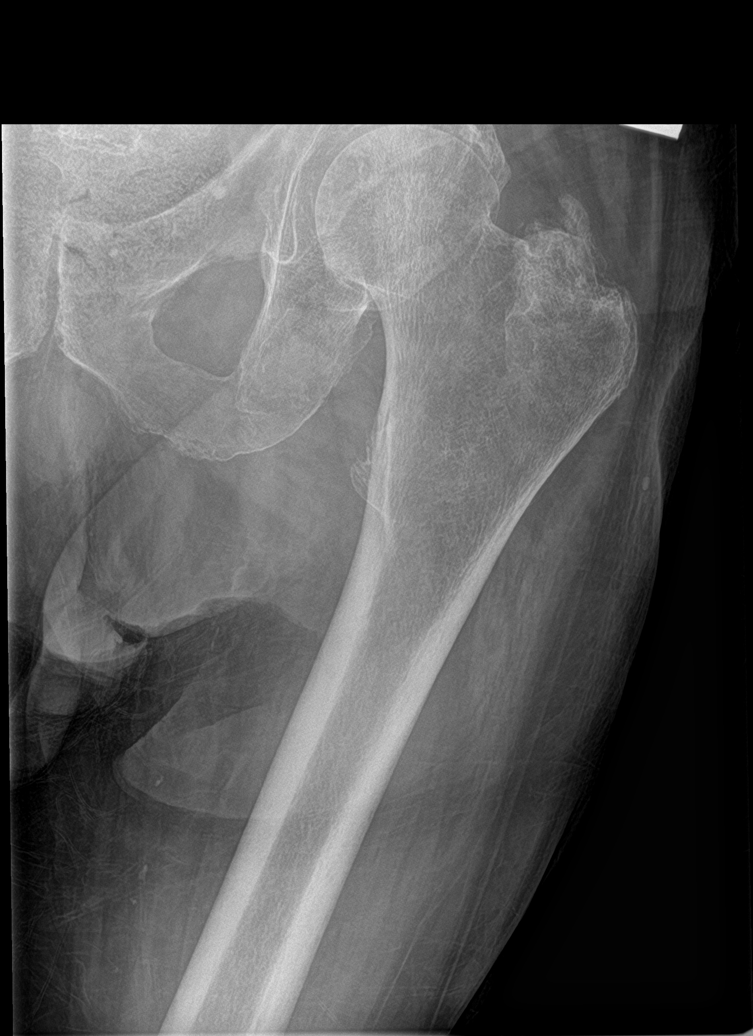

[hip lat (3 of 3)]
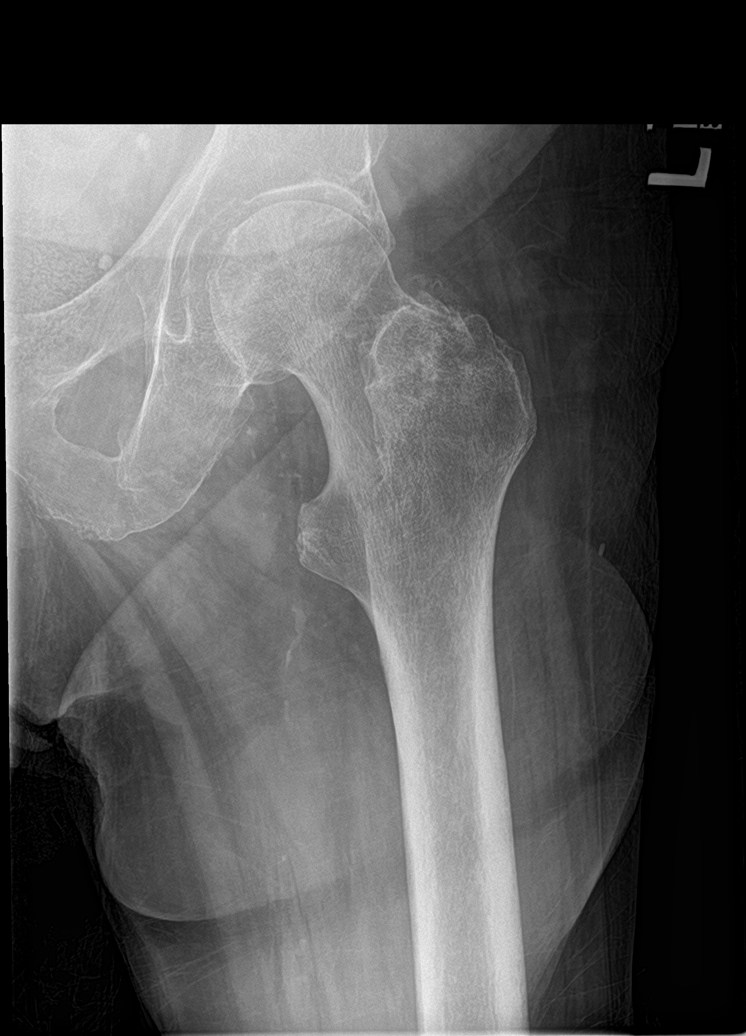

[6 of 6 positions shown; findings below may reference images not displayed]

FINDINGS: Postsurgical changes are seen. Pelvic ring is intact. Mild
degenerative changes of the hip joints are noted bilaterally. No
soft tissue changes are seen. Prior hernia repair is noted.
IMPRESSION: Chronic changes without acute abnormality.

## 2018-10-10 DIAGNOSIS — R41841 Cognitive communication deficit: Secondary | ICD-10-CM | POA: Diagnosis not present

## 2018-10-10 DIAGNOSIS — R1314 Dysphagia, pharyngoesophageal phase: Secondary | ICD-10-CM | POA: Diagnosis not present

## 2018-10-10 DIAGNOSIS — C001 Malignant neoplasm of external lower lip: Secondary | ICD-10-CM | POA: Diagnosis not present

## 2018-10-10 DIAGNOSIS — M6281 Muscle weakness (generalized): Secondary | ICD-10-CM | POA: Diagnosis not present

## 2018-10-10 DIAGNOSIS — R1311 Dysphagia, oral phase: Secondary | ICD-10-CM | POA: Diagnosis not present

## 2018-10-10 DIAGNOSIS — R498 Other voice and resonance disorders: Secondary | ICD-10-CM | POA: Diagnosis not present

## 2018-10-11 DIAGNOSIS — R498 Other voice and resonance disorders: Secondary | ICD-10-CM | POA: Diagnosis not present

## 2018-10-11 DIAGNOSIS — R1311 Dysphagia, oral phase: Secondary | ICD-10-CM | POA: Diagnosis not present

## 2018-10-11 DIAGNOSIS — R1314 Dysphagia, pharyngoesophageal phase: Secondary | ICD-10-CM | POA: Diagnosis not present

## 2018-10-11 DIAGNOSIS — C001 Malignant neoplasm of external lower lip: Secondary | ICD-10-CM | POA: Diagnosis not present

## 2018-10-11 DIAGNOSIS — M6281 Muscle weakness (generalized): Secondary | ICD-10-CM | POA: Diagnosis not present

## 2018-10-11 DIAGNOSIS — R41841 Cognitive communication deficit: Secondary | ICD-10-CM | POA: Diagnosis not present

## 2018-10-12 ENCOUNTER — Ambulatory Visit (INDEPENDENT_AMBULATORY_CARE_PROVIDER_SITE_OTHER): Payer: Medicare Other | Admitting: Surgical

## 2018-10-12 ENCOUNTER — Encounter: Payer: Self-pay | Admitting: Surgical

## 2018-10-12 ENCOUNTER — Other Ambulatory Visit: Payer: Self-pay

## 2018-10-12 VITALS — BP 108/60 | HR 66 | Temp 98.2°F

## 2018-10-12 DIAGNOSIS — R41841 Cognitive communication deficit: Secondary | ICD-10-CM | POA: Diagnosis not present

## 2018-10-12 DIAGNOSIS — S0100XD Unspecified open wound of scalp, subsequent encounter: Secondary | ICD-10-CM

## 2018-10-12 DIAGNOSIS — R1311 Dysphagia, oral phase: Secondary | ICD-10-CM | POA: Diagnosis not present

## 2018-10-12 DIAGNOSIS — C001 Malignant neoplasm of external lower lip: Secondary | ICD-10-CM | POA: Diagnosis not present

## 2018-10-12 DIAGNOSIS — R498 Other voice and resonance disorders: Secondary | ICD-10-CM | POA: Diagnosis not present

## 2018-10-12 DIAGNOSIS — M6281 Muscle weakness (generalized): Secondary | ICD-10-CM | POA: Diagnosis not present

## 2018-10-12 DIAGNOSIS — R1314 Dysphagia, pharyngoesophageal phase: Secondary | ICD-10-CM | POA: Diagnosis not present

## 2018-10-12 DIAGNOSIS — L989 Disorder of the skin and subcutaneous tissue, unspecified: Secondary | ICD-10-CM

## 2018-10-12 NOTE — Progress Notes (Signed)
   Subjective:     Patient ID: Anthony [name], male    DOB: 04/13/1940, 78 y.o.   MRN: [id number]  Chief Complaint  Patient presents with  . Post-op Follow-up    HPI: The patient is a 78 y.o. male here for follow-up after revision of lower lip flap and closure of lower lip defect on 10/02/18 with Dr. Marla Roe.  Anthony [name] also had a left cheek skin lesion excised and scalp defect that required surgery on 09/18/2018.  Anthony [name]'s scalp lesion has completely healed.  Bandage was removed today.  No sign of infection, no dehiscence, no drainage or erythema noted.  The skin is pink in color with overlying telangiectasias.  Anthony [name] left cheek incision is healing well.  There is no sign of dehiscence or infection.  No erythema, no drainage, no seroma.  His lower lip defect is doing well.  He reports that he has been eating more than he ever could before, drinking plenty of fluids and is happy with his progress.  Per the nursing home that he is staying at he has been doing very well.  He reports that he has minimal pain, mostly with eating and talking.  He still has his trach and PEG tube.  Call general surgery to inquire about removal since patient has been eating and drinking.    Review of Systems  Constitutional: Negative for chills, fever and malaise/fatigue.  HENT: Negative.   Respiratory: Negative.   Cardiovascular: Negative.   Musculoskeletal: Negative.   Skin: Negative for itching and rash.     Objective:   Vital Signs BP 108/60 (BP Location: Left Arm, Patient Position: Sitting, Cuff Size: Normal)   Pulse 66   Temp 98.2 F (36.8 C) (Temporal)   SpO2 (!) 85%  Vital Signs and Nursing Note Reviewed  Physical Exam  Constitutional: He is oriented to person, place, and time and well-developed, well-nourished, and in no distress. No distress.  Anthony [name] in wheelchair  HENT:  Head: Normocephalic and atraumatic.    Mouth/Throat: Mucous membranes are normal.     Pulmonary/Chest: Effort normal.  Musculoskeletal: Normal range of motion.  Neurological: He is alert and oriented to person, place, and time.  Skin: Skin is warm and dry. He is not diaphoretic.  Psychiatric: Mood and affect normal.      Assessment/Plan:     ICD-10-CM   1. Open wound of scalp, unspecified open wound type, subsequent encounter  S01.00XD   2. Cancer of lower lip  C00.1   3. Lesion of face  L98.9     Anthony [name] is doing well.  He is now able to eat and drink.  Nursing home reported that he has been drinking fluids very well and for most of the day.  He has also been eating a soft mechanical diet and is very happy about this.   The nursing home should continue with Vaseline to his lip defect twice daily.  They can also begin with Vaseline to the right posterior scalp defect.  Anthony [name] can follow-up as needed.   Anthony Rhine Greg Cratty, PA-C 10/12/2018, 10:38 AM

## 2018-10-13 DIAGNOSIS — R41841 Cognitive communication deficit: Secondary | ICD-10-CM | POA: Diagnosis not present

## 2018-10-13 DIAGNOSIS — M6281 Muscle weakness (generalized): Secondary | ICD-10-CM | POA: Diagnosis not present

## 2018-10-13 DIAGNOSIS — F331 Major depressive disorder, recurrent, moderate: Secondary | ICD-10-CM | POA: Diagnosis not present

## 2018-10-13 DIAGNOSIS — F4312 Post-traumatic stress disorder, chronic: Secondary | ICD-10-CM | POA: Diagnosis not present

## 2018-10-13 DIAGNOSIS — R498 Other voice and resonance disorders: Secondary | ICD-10-CM | POA: Diagnosis not present

## 2018-10-13 DIAGNOSIS — F0150 Vascular dementia without behavioral disturbance: Secondary | ICD-10-CM | POA: Diagnosis not present

## 2018-10-13 DIAGNOSIS — C001 Malignant neoplasm of external lower lip: Secondary | ICD-10-CM | POA: Diagnosis not present

## 2018-10-13 DIAGNOSIS — R1311 Dysphagia, oral phase: Secondary | ICD-10-CM | POA: Diagnosis not present

## 2018-10-13 DIAGNOSIS — R1314 Dysphagia, pharyngoesophageal phase: Secondary | ICD-10-CM | POA: Diagnosis not present

## 2018-10-13 NOTE — Addendum Note (Signed)
Addended by: Harl Bowie on: 10/13/2018 12:04 PM   Modules accepted: Orders

## 2018-10-16 ENCOUNTER — Encounter (HOSPITAL_COMMUNITY): Payer: Self-pay | Admitting: Plastic Surgery

## 2018-10-18 DIAGNOSIS — R41841 Cognitive communication deficit: Secondary | ICD-10-CM | POA: Diagnosis not present

## 2018-10-18 DIAGNOSIS — R1314 Dysphagia, pharyngoesophageal phase: Secondary | ICD-10-CM | POA: Diagnosis not present

## 2018-10-18 DIAGNOSIS — M6281 Muscle weakness (generalized): Secondary | ICD-10-CM | POA: Diagnosis not present

## 2018-10-18 DIAGNOSIS — R498 Other voice and resonance disorders: Secondary | ICD-10-CM | POA: Diagnosis not present

## 2018-10-18 DIAGNOSIS — R1311 Dysphagia, oral phase: Secondary | ICD-10-CM | POA: Diagnosis not present

## 2018-10-18 DIAGNOSIS — C001 Malignant neoplasm of external lower lip: Secondary | ICD-10-CM | POA: Diagnosis not present

## 2018-10-19 DIAGNOSIS — R1314 Dysphagia, pharyngoesophageal phase: Secondary | ICD-10-CM | POA: Diagnosis not present

## 2018-10-19 DIAGNOSIS — C001 Malignant neoplasm of external lower lip: Secondary | ICD-10-CM | POA: Diagnosis not present

## 2018-10-19 DIAGNOSIS — R498 Other voice and resonance disorders: Secondary | ICD-10-CM | POA: Diagnosis not present

## 2018-10-19 DIAGNOSIS — R41841 Cognitive communication deficit: Secondary | ICD-10-CM | POA: Diagnosis not present

## 2018-10-19 DIAGNOSIS — M6281 Muscle weakness (generalized): Secondary | ICD-10-CM | POA: Diagnosis not present

## 2018-10-19 DIAGNOSIS — R1311 Dysphagia, oral phase: Secondary | ICD-10-CM | POA: Diagnosis not present

## 2018-10-20 DIAGNOSIS — R498 Other voice and resonance disorders: Secondary | ICD-10-CM | POA: Diagnosis not present

## 2018-10-20 DIAGNOSIS — R1314 Dysphagia, pharyngoesophageal phase: Secondary | ICD-10-CM | POA: Diagnosis not present

## 2018-10-20 DIAGNOSIS — R1311 Dysphagia, oral phase: Secondary | ICD-10-CM | POA: Diagnosis not present

## 2018-10-20 DIAGNOSIS — R41841 Cognitive communication deficit: Secondary | ICD-10-CM | POA: Diagnosis not present

## 2018-10-20 DIAGNOSIS — Z20828 Contact with and (suspected) exposure to other viral communicable diseases: Secondary | ICD-10-CM | POA: Diagnosis not present

## 2018-10-20 DIAGNOSIS — C001 Malignant neoplasm of external lower lip: Secondary | ICD-10-CM | POA: Diagnosis not present

## 2018-10-20 DIAGNOSIS — M6281 Muscle weakness (generalized): Secondary | ICD-10-CM | POA: Diagnosis not present

## 2018-10-23 DIAGNOSIS — R41841 Cognitive communication deficit: Secondary | ICD-10-CM | POA: Diagnosis not present

## 2018-10-23 DIAGNOSIS — R1314 Dysphagia, pharyngoesophageal phase: Secondary | ICD-10-CM | POA: Diagnosis not present

## 2018-10-23 DIAGNOSIS — R1311 Dysphagia, oral phase: Secondary | ICD-10-CM | POA: Diagnosis not present

## 2018-10-23 DIAGNOSIS — R498 Other voice and resonance disorders: Secondary | ICD-10-CM | POA: Diagnosis not present

## 2018-10-23 DIAGNOSIS — C001 Malignant neoplasm of external lower lip: Secondary | ICD-10-CM | POA: Diagnosis not present

## 2018-10-23 DIAGNOSIS — M6281 Muscle weakness (generalized): Secondary | ICD-10-CM | POA: Diagnosis not present

## 2018-10-24 DIAGNOSIS — C001 Malignant neoplasm of external lower lip: Secondary | ICD-10-CM | POA: Diagnosis not present

## 2018-10-24 DIAGNOSIS — R41841 Cognitive communication deficit: Secondary | ICD-10-CM | POA: Diagnosis not present

## 2018-10-24 DIAGNOSIS — R1314 Dysphagia, pharyngoesophageal phase: Secondary | ICD-10-CM | POA: Diagnosis not present

## 2018-10-24 DIAGNOSIS — R1311 Dysphagia, oral phase: Secondary | ICD-10-CM | POA: Diagnosis not present

## 2018-10-24 DIAGNOSIS — R498 Other voice and resonance disorders: Secondary | ICD-10-CM | POA: Diagnosis not present

## 2018-10-24 DIAGNOSIS — M6281 Muscle weakness (generalized): Secondary | ICD-10-CM | POA: Diagnosis not present

## 2018-10-25 DIAGNOSIS — C001 Malignant neoplasm of external lower lip: Secondary | ICD-10-CM | POA: Diagnosis not present

## 2018-10-25 DIAGNOSIS — M6281 Muscle weakness (generalized): Secondary | ICD-10-CM | POA: Diagnosis not present

## 2018-10-25 DIAGNOSIS — R1314 Dysphagia, pharyngoesophageal phase: Secondary | ICD-10-CM | POA: Diagnosis not present

## 2018-10-25 DIAGNOSIS — R498 Other voice and resonance disorders: Secondary | ICD-10-CM | POA: Diagnosis not present

## 2018-10-25 DIAGNOSIS — R1311 Dysphagia, oral phase: Secondary | ICD-10-CM | POA: Diagnosis not present

## 2018-10-25 DIAGNOSIS — R41841 Cognitive communication deficit: Secondary | ICD-10-CM | POA: Diagnosis not present

## 2018-10-26 DIAGNOSIS — C001 Malignant neoplasm of external lower lip: Secondary | ICD-10-CM | POA: Diagnosis not present

## 2018-10-26 DIAGNOSIS — R498 Other voice and resonance disorders: Secondary | ICD-10-CM | POA: Diagnosis not present

## 2018-10-26 DIAGNOSIS — R41841 Cognitive communication deficit: Secondary | ICD-10-CM | POA: Diagnosis not present

## 2018-10-26 DIAGNOSIS — R1311 Dysphagia, oral phase: Secondary | ICD-10-CM | POA: Diagnosis not present

## 2018-10-26 DIAGNOSIS — M6281 Muscle weakness (generalized): Secondary | ICD-10-CM | POA: Diagnosis not present

## 2018-10-26 DIAGNOSIS — R1314 Dysphagia, pharyngoesophageal phase: Secondary | ICD-10-CM | POA: Diagnosis not present

## 2018-10-27 DIAGNOSIS — L03818 Cellulitis of other sites: Secondary | ICD-10-CM | POA: Diagnosis not present

## 2018-10-29 DIAGNOSIS — R41841 Cognitive communication deficit: Secondary | ICD-10-CM | POA: Diagnosis not present

## 2018-10-29 DIAGNOSIS — R498 Other voice and resonance disorders: Secondary | ICD-10-CM | POA: Diagnosis not present

## 2018-10-29 DIAGNOSIS — R1314 Dysphagia, pharyngoesophageal phase: Secondary | ICD-10-CM | POA: Diagnosis not present

## 2018-10-29 DIAGNOSIS — R1311 Dysphagia, oral phase: Secondary | ICD-10-CM | POA: Diagnosis not present

## 2018-10-29 DIAGNOSIS — C001 Malignant neoplasm of external lower lip: Secondary | ICD-10-CM | POA: Diagnosis not present

## 2018-10-29 DIAGNOSIS — M6281 Muscle weakness (generalized): Secondary | ICD-10-CM | POA: Diagnosis not present

## 2018-11-01 ENCOUNTER — Ambulatory Visit: Payer: Medicare Other | Admitting: Urology

## 2018-11-05 DIAGNOSIS — R5381 Other malaise: Secondary | ICD-10-CM | POA: Diagnosis not present

## 2018-11-05 DIAGNOSIS — Z7401 Bed confinement status: Secondary | ICD-10-CM | POA: Diagnosis not present

## 2018-11-05 DIAGNOSIS — E46 Unspecified protein-calorie malnutrition: Secondary | ICD-10-CM | POA: Diagnosis not present

## 2018-11-05 DIAGNOSIS — N139 Obstructive and reflux uropathy, unspecified: Secondary | ICD-10-CM | POA: Diagnosis not present

## 2018-11-05 DIAGNOSIS — R509 Fever, unspecified: Secondary | ICD-10-CM | POA: Diagnosis not present

## 2018-11-05 DIAGNOSIS — M6281 Muscle weakness (generalized): Secondary | ICD-10-CM | POA: Diagnosis not present

## 2018-11-05 DIAGNOSIS — B964 Proteus (mirabilis) (morganii) as the cause of diseases classified elsewhere: Secondary | ICD-10-CM | POA: Diagnosis not present

## 2018-11-05 DIAGNOSIS — C4402 Squamous cell carcinoma of skin of lip: Secondary | ICD-10-CM | POA: Diagnosis not present

## 2018-11-05 DIAGNOSIS — J440 Chronic obstructive pulmonary disease with acute lower respiratory infection: Secondary | ICD-10-CM | POA: Diagnosis present

## 2018-11-05 DIAGNOSIS — J1289 Other viral pneumonia: Secondary | ICD-10-CM | POA: Diagnosis not present

## 2018-11-05 DIAGNOSIS — F419 Anxiety disorder, unspecified: Secondary | ICD-10-CM | POA: Diagnosis not present

## 2018-11-05 DIAGNOSIS — I129 Hypertensive chronic kidney disease with stage 1 through stage 4 chronic kidney disease, or unspecified chronic kidney disease: Secondary | ICD-10-CM | POA: Diagnosis not present

## 2018-11-05 DIAGNOSIS — I700 Atherosclerosis of aorta: Secondary | ICD-10-CM | POA: Diagnosis not present

## 2018-11-05 DIAGNOSIS — Z430 Encounter for attention to tracheostomy: Secondary | ICD-10-CM | POA: Diagnosis not present

## 2018-11-05 DIAGNOSIS — J189 Pneumonia, unspecified organism: Secondary | ICD-10-CM | POA: Diagnosis not present

## 2018-11-05 DIAGNOSIS — F0390 Unspecified dementia without behavioral disturbance: Secondary | ICD-10-CM | POA: Diagnosis present

## 2018-11-05 DIAGNOSIS — Z880 Allergy status to penicillin: Secondary | ICD-10-CM | POA: Diagnosis not present

## 2018-11-05 DIAGNOSIS — Z209 Contact with and (suspected) exposure to unspecified communicable disease: Secondary | ICD-10-CM | POA: Diagnosis not present

## 2018-11-05 DIAGNOSIS — K5900 Constipation, unspecified: Secondary | ICD-10-CM | POA: Diagnosis not present

## 2018-11-05 DIAGNOSIS — I10 Essential (primary) hypertension: Secondary | ICD-10-CM | POA: Diagnosis not present

## 2018-11-05 DIAGNOSIS — Z431 Encounter for attention to gastrostomy: Secondary | ICD-10-CM | POA: Diagnosis not present

## 2018-11-05 DIAGNOSIS — C4440 Unspecified malignant neoplasm of skin of scalp and neck: Secondary | ICD-10-CM | POA: Diagnosis not present

## 2018-11-05 DIAGNOSIS — G2581 Restless legs syndrome: Secondary | ICD-10-CM | POA: Diagnosis not present

## 2018-11-05 DIAGNOSIS — N183 Chronic kidney disease, stage 3 (moderate): Secondary | ICD-10-CM | POA: Diagnosis not present

## 2018-11-05 DIAGNOSIS — N390 Urinary tract infection, site not specified: Secondary | ICD-10-CM | POA: Diagnosis not present

## 2018-11-05 DIAGNOSIS — R0902 Hypoxemia: Secondary | ICD-10-CM | POA: Diagnosis not present

## 2018-11-05 DIAGNOSIS — Z931 Gastrostomy status: Secondary | ICD-10-CM | POA: Diagnosis not present

## 2018-11-05 DIAGNOSIS — I959 Hypotension, unspecified: Secondary | ICD-10-CM | POA: Diagnosis not present

## 2018-11-05 DIAGNOSIS — R404 Transient alteration of awareness: Secondary | ICD-10-CM | POA: Diagnosis not present

## 2018-11-05 DIAGNOSIS — D696 Thrombocytopenia, unspecified: Secondary | ICD-10-CM | POA: Diagnosis present

## 2018-11-05 DIAGNOSIS — R52 Pain, unspecified: Secondary | ICD-10-CM | POA: Diagnosis not present

## 2018-11-05 DIAGNOSIS — J1281 Pneumonia due to SARS-associated coronavirus: Secondary | ICD-10-CM | POA: Diagnosis not present

## 2018-11-05 DIAGNOSIS — Z930 Tracheostomy status: Secondary | ICD-10-CM | POA: Diagnosis not present

## 2018-11-05 DIAGNOSIS — D649 Anemia, unspecified: Secondary | ICD-10-CM | POA: Diagnosis not present

## 2018-11-05 DIAGNOSIS — F339 Major depressive disorder, recurrent, unspecified: Secondary | ICD-10-CM | POA: Diagnosis not present

## 2018-11-05 DIAGNOSIS — Z20828 Contact with and (suspected) exposure to other viral communicable diseases: Secondary | ICD-10-CM | POA: Diagnosis not present

## 2018-11-05 DIAGNOSIS — Z91030 Bee allergy status: Secondary | ICD-10-CM | POA: Diagnosis not present

## 2018-11-05 DIAGNOSIS — H9190 Unspecified hearing loss, unspecified ear: Secondary | ICD-10-CM | POA: Diagnosis present

## 2018-11-05 DIAGNOSIS — J449 Chronic obstructive pulmonary disease, unspecified: Secondary | ICD-10-CM | POA: Diagnosis not present

## 2018-11-05 DIAGNOSIS — Z91018 Allergy to other foods: Secondary | ICD-10-CM | POA: Diagnosis not present

## 2018-11-05 DIAGNOSIS — F418 Other specified anxiety disorders: Secondary | ICD-10-CM | POA: Diagnosis present

## 2018-11-05 DIAGNOSIS — R1111 Vomiting without nausea: Secondary | ICD-10-CM | POA: Diagnosis not present

## 2018-11-05 DIAGNOSIS — N401 Enlarged prostate with lower urinary tract symptoms: Secondary | ICD-10-CM | POA: Diagnosis not present

## 2018-11-05 DIAGNOSIS — R278 Other lack of coordination: Secondary | ICD-10-CM | POA: Diagnosis not present

## 2018-11-05 DIAGNOSIS — C001 Malignant neoplasm of external lower lip: Secondary | ICD-10-CM | POA: Diagnosis not present

## 2018-11-05 DIAGNOSIS — G990 Autonomic neuropathy in diseases classified elsewhere: Secondary | ICD-10-CM | POA: Diagnosis not present

## 2018-11-05 DIAGNOSIS — U071 COVID-19: Secondary | ICD-10-CM | POA: Diagnosis not present

## 2018-11-05 DIAGNOSIS — K219 Gastro-esophageal reflux disease without esophagitis: Secondary | ICD-10-CM | POA: Diagnosis not present

## 2018-11-05 DIAGNOSIS — Z87891 Personal history of nicotine dependence: Secondary | ICD-10-CM | POA: Diagnosis not present

## 2018-11-05 DIAGNOSIS — R109 Unspecified abdominal pain: Secondary | ICD-10-CM | POA: Diagnosis not present

## 2018-11-05 DIAGNOSIS — F0150 Vascular dementia without behavioral disturbance: Secondary | ICD-10-CM | POA: Diagnosis not present

## 2018-11-05 DIAGNOSIS — T83198A Other mechanical complication of other urinary devices and implants, initial encounter: Secondary | ICD-10-CM | POA: Diagnosis not present

## 2018-11-06 DIAGNOSIS — J449 Chronic obstructive pulmonary disease, unspecified: Secondary | ICD-10-CM | POA: Diagnosis not present

## 2018-11-06 DIAGNOSIS — N183 Chronic kidney disease, stage 3 (moderate): Secondary | ICD-10-CM | POA: Diagnosis not present

## 2018-11-08 DIAGNOSIS — I959 Hypotension, unspecified: Secondary | ICD-10-CM | POA: Diagnosis not present

## 2018-11-08 DIAGNOSIS — F339 Major depressive disorder, recurrent, unspecified: Secondary | ICD-10-CM | POA: Diagnosis not present

## 2018-11-08 DIAGNOSIS — N390 Urinary tract infection, site not specified: Secondary | ICD-10-CM | POA: Diagnosis not present

## 2018-11-08 DIAGNOSIS — Z7401 Bed confinement status: Secondary | ICD-10-CM | POA: Diagnosis not present

## 2018-11-08 DIAGNOSIS — R404 Transient alteration of awareness: Secondary | ICD-10-CM | POA: Diagnosis not present

## 2018-11-08 DIAGNOSIS — Z931 Gastrostomy status: Secondary | ICD-10-CM | POA: Diagnosis not present

## 2018-11-08 DIAGNOSIS — F0150 Vascular dementia without behavioral disturbance: Secondary | ICD-10-CM | POA: Diagnosis not present

## 2018-11-08 DIAGNOSIS — N139 Obstructive and reflux uropathy, unspecified: Secondary | ICD-10-CM | POA: Diagnosis not present

## 2018-11-08 DIAGNOSIS — N189 Chronic kidney disease, unspecified: Secondary | ICD-10-CM | POA: Diagnosis not present

## 2018-11-08 DIAGNOSIS — R278 Other lack of coordination: Secondary | ICD-10-CM | POA: Diagnosis not present

## 2018-11-08 DIAGNOSIS — M6281 Muscle weakness (generalized): Secondary | ICD-10-CM | POA: Diagnosis not present

## 2018-11-08 DIAGNOSIS — N401 Enlarged prostate with lower urinary tract symptoms: Secondary | ICD-10-CM | POA: Diagnosis not present

## 2018-11-08 DIAGNOSIS — J1281 Pneumonia due to SARS-associated coronavirus: Secondary | ICD-10-CM | POA: Diagnosis not present

## 2018-11-08 DIAGNOSIS — R0902 Hypoxemia: Secondary | ICD-10-CM | POA: Diagnosis not present

## 2018-11-08 DIAGNOSIS — R0602 Shortness of breath: Secondary | ICD-10-CM | POA: Diagnosis not present

## 2018-11-08 DIAGNOSIS — F0390 Unspecified dementia without behavioral disturbance: Secondary | ICD-10-CM | POA: Diagnosis not present

## 2018-11-08 DIAGNOSIS — J449 Chronic obstructive pulmonary disease, unspecified: Secondary | ICD-10-CM | POA: Diagnosis not present

## 2018-11-08 DIAGNOSIS — Z430 Encounter for attention to tracheostomy: Secondary | ICD-10-CM | POA: Diagnosis not present

## 2018-11-08 DIAGNOSIS — I700 Atherosclerosis of aorta: Secondary | ICD-10-CM | POA: Diagnosis not present

## 2018-11-08 DIAGNOSIS — D649 Anemia, unspecified: Secondary | ICD-10-CM | POA: Diagnosis not present

## 2018-11-08 DIAGNOSIS — R52 Pain, unspecified: Secondary | ICD-10-CM | POA: Diagnosis not present

## 2018-11-08 DIAGNOSIS — Z79899 Other long term (current) drug therapy: Secondary | ICD-10-CM | POA: Diagnosis not present

## 2018-11-08 DIAGNOSIS — G2581 Restless legs syndrome: Secondary | ICD-10-CM | POA: Diagnosis not present

## 2018-11-08 DIAGNOSIS — K219 Gastro-esophageal reflux disease without esophagitis: Secondary | ICD-10-CM | POA: Diagnosis not present

## 2018-11-08 DIAGNOSIS — Z87891 Personal history of nicotine dependence: Secondary | ICD-10-CM | POA: Diagnosis not present

## 2018-11-08 DIAGNOSIS — C4440 Unspecified malignant neoplasm of skin of scalp and neck: Secondary | ICD-10-CM | POA: Diagnosis not present

## 2018-11-08 DIAGNOSIS — C001 Malignant neoplasm of external lower lip: Secondary | ICD-10-CM | POA: Diagnosis not present

## 2018-11-08 DIAGNOSIS — R410 Disorientation, unspecified: Secondary | ICD-10-CM | POA: Diagnosis not present

## 2018-11-08 DIAGNOSIS — U071 COVID-19: Secondary | ICD-10-CM | POA: Diagnosis not present

## 2018-11-08 DIAGNOSIS — Z7901 Long term (current) use of anticoagulants: Secondary | ICD-10-CM | POA: Diagnosis not present

## 2018-11-08 DIAGNOSIS — T83198A Other mechanical complication of other urinary devices and implants, initial encounter: Secondary | ICD-10-CM | POA: Diagnosis not present

## 2018-11-08 DIAGNOSIS — F419 Anxiety disorder, unspecified: Secondary | ICD-10-CM | POA: Diagnosis not present

## 2018-11-08 DIAGNOSIS — Z431 Encounter for attention to gastrostomy: Secondary | ICD-10-CM | POA: Diagnosis not present

## 2018-11-08 DIAGNOSIS — G990 Autonomic neuropathy in diseases classified elsewhere: Secondary | ICD-10-CM | POA: Diagnosis not present

## 2018-11-08 DIAGNOSIS — C4402 Squamous cell carcinoma of skin of lip: Secondary | ICD-10-CM | POA: Diagnosis not present

## 2018-11-08 DIAGNOSIS — E46 Unspecified protein-calorie malnutrition: Secondary | ICD-10-CM | POA: Diagnosis not present

## 2018-11-08 DIAGNOSIS — Z930 Tracheostomy status: Secondary | ICD-10-CM | POA: Diagnosis not present

## 2018-11-10 DIAGNOSIS — Z79899 Other long term (current) drug therapy: Secondary | ICD-10-CM | POA: Diagnosis not present

## 2018-11-10 DIAGNOSIS — R0602 Shortness of breath: Secondary | ICD-10-CM | POA: Diagnosis not present

## 2018-11-10 DIAGNOSIS — U071 COVID-19: Secondary | ICD-10-CM | POA: Diagnosis not present

## 2018-11-10 DIAGNOSIS — N189 Chronic kidney disease, unspecified: Secondary | ICD-10-CM | POA: Diagnosis not present

## 2018-11-10 DIAGNOSIS — K219 Gastro-esophageal reflux disease without esophagitis: Secondary | ICD-10-CM | POA: Diagnosis not present

## 2018-11-10 DIAGNOSIS — Z7901 Long term (current) use of anticoagulants: Secondary | ICD-10-CM | POA: Diagnosis not present

## 2018-11-10 DIAGNOSIS — Z430 Encounter for attention to tracheostomy: Secondary | ICD-10-CM | POA: Diagnosis not present

## 2018-11-20 DIAGNOSIS — R41841 Cognitive communication deficit: Secondary | ICD-10-CM | POA: Diagnosis not present

## 2018-11-20 DIAGNOSIS — U071 COVID-19: Secondary | ICD-10-CM | POA: Diagnosis not present

## 2018-11-20 DIAGNOSIS — R1312 Dysphagia, oropharyngeal phase: Secondary | ICD-10-CM | POA: Diagnosis not present

## 2018-11-21 DIAGNOSIS — R1312 Dysphagia, oropharyngeal phase: Secondary | ICD-10-CM | POA: Diagnosis not present

## 2018-11-21 DIAGNOSIS — U071 COVID-19: Secondary | ICD-10-CM | POA: Diagnosis not present

## 2018-11-21 DIAGNOSIS — R41841 Cognitive communication deficit: Secondary | ICD-10-CM | POA: Diagnosis not present

## 2018-11-22 DIAGNOSIS — R41841 Cognitive communication deficit: Secondary | ICD-10-CM | POA: Diagnosis not present

## 2018-11-22 DIAGNOSIS — U071 COVID-19: Secondary | ICD-10-CM | POA: Diagnosis not present

## 2018-11-22 DIAGNOSIS — R1312 Dysphagia, oropharyngeal phase: Secondary | ICD-10-CM | POA: Diagnosis not present

## 2018-11-23 DIAGNOSIS — R41841 Cognitive communication deficit: Secondary | ICD-10-CM | POA: Diagnosis not present

## 2018-11-23 DIAGNOSIS — U071 COVID-19: Secondary | ICD-10-CM | POA: Diagnosis not present

## 2018-11-23 DIAGNOSIS — R1312 Dysphagia, oropharyngeal phase: Secondary | ICD-10-CM | POA: Diagnosis not present

## 2018-11-24 DIAGNOSIS — U071 COVID-19: Secondary | ICD-10-CM | POA: Diagnosis not present

## 2018-11-24 DIAGNOSIS — R41841 Cognitive communication deficit: Secondary | ICD-10-CM | POA: Diagnosis not present

## 2018-11-24 DIAGNOSIS — R1312 Dysphagia, oropharyngeal phase: Secondary | ICD-10-CM | POA: Diagnosis not present

## 2018-11-27 DIAGNOSIS — U071 COVID-19: Secondary | ICD-10-CM | POA: Diagnosis not present

## 2018-11-27 DIAGNOSIS — R1312 Dysphagia, oropharyngeal phase: Secondary | ICD-10-CM | POA: Diagnosis not present

## 2018-11-27 DIAGNOSIS — R41841 Cognitive communication deficit: Secondary | ICD-10-CM | POA: Diagnosis not present

## 2018-11-28 DIAGNOSIS — U071 COVID-19: Secondary | ICD-10-CM | POA: Diagnosis not present

## 2018-11-28 DIAGNOSIS — R41841 Cognitive communication deficit: Secondary | ICD-10-CM | POA: Diagnosis not present

## 2018-11-28 DIAGNOSIS — R1312 Dysphagia, oropharyngeal phase: Secondary | ICD-10-CM | POA: Diagnosis not present

## 2018-11-29 DIAGNOSIS — R41841 Cognitive communication deficit: Secondary | ICD-10-CM | POA: Diagnosis not present

## 2018-11-29 DIAGNOSIS — R1312 Dysphagia, oropharyngeal phase: Secondary | ICD-10-CM | POA: Diagnosis not present

## 2018-11-29 DIAGNOSIS — U071 COVID-19: Secondary | ICD-10-CM | POA: Diagnosis not present

## 2018-11-30 DIAGNOSIS — R1312 Dysphagia, oropharyngeal phase: Secondary | ICD-10-CM | POA: Diagnosis not present

## 2018-11-30 DIAGNOSIS — U071 COVID-19: Secondary | ICD-10-CM | POA: Diagnosis not present

## 2018-11-30 DIAGNOSIS — R41841 Cognitive communication deficit: Secondary | ICD-10-CM | POA: Diagnosis not present

## 2018-12-01 DIAGNOSIS — R41841 Cognitive communication deficit: Secondary | ICD-10-CM | POA: Diagnosis not present

## 2018-12-01 DIAGNOSIS — U071 COVID-19: Secondary | ICD-10-CM | POA: Diagnosis not present

## 2018-12-01 DIAGNOSIS — R1312 Dysphagia, oropharyngeal phase: Secondary | ICD-10-CM | POA: Diagnosis not present

## 2018-12-04 DIAGNOSIS — R41841 Cognitive communication deficit: Secondary | ICD-10-CM | POA: Diagnosis not present

## 2018-12-04 DIAGNOSIS — R1312 Dysphagia, oropharyngeal phase: Secondary | ICD-10-CM | POA: Diagnosis not present

## 2018-12-04 DIAGNOSIS — U071 COVID-19: Secondary | ICD-10-CM | POA: Diagnosis not present

## 2018-12-05 DIAGNOSIS — R41841 Cognitive communication deficit: Secondary | ICD-10-CM | POA: Diagnosis not present

## 2018-12-05 DIAGNOSIS — R1312 Dysphagia, oropharyngeal phase: Secondary | ICD-10-CM | POA: Diagnosis not present

## 2018-12-05 DIAGNOSIS — U071 COVID-19: Secondary | ICD-10-CM | POA: Diagnosis not present

## 2018-12-06 DIAGNOSIS — R1312 Dysphagia, oropharyngeal phase: Secondary | ICD-10-CM | POA: Diagnosis not present

## 2018-12-06 DIAGNOSIS — U071 COVID-19: Secondary | ICD-10-CM | POA: Diagnosis not present

## 2018-12-06 DIAGNOSIS — J449 Chronic obstructive pulmonary disease, unspecified: Secondary | ICD-10-CM | POA: Diagnosis not present

## 2018-12-06 DIAGNOSIS — N183 Chronic kidney disease, stage 3 (moderate): Secondary | ICD-10-CM | POA: Diagnosis not present

## 2018-12-06 DIAGNOSIS — R41841 Cognitive communication deficit: Secondary | ICD-10-CM | POA: Diagnosis not present

## 2018-12-07 DIAGNOSIS — R41841 Cognitive communication deficit: Secondary | ICD-10-CM | POA: Diagnosis not present

## 2018-12-07 DIAGNOSIS — U071 COVID-19: Secondary | ICD-10-CM | POA: Diagnosis not present

## 2018-12-07 DIAGNOSIS — R1312 Dysphagia, oropharyngeal phase: Secondary | ICD-10-CM | POA: Diagnosis not present

## 2018-12-08 DIAGNOSIS — U071 COVID-19: Secondary | ICD-10-CM | POA: Diagnosis not present

## 2018-12-08 DIAGNOSIS — R41841 Cognitive communication deficit: Secondary | ICD-10-CM | POA: Diagnosis not present

## 2018-12-08 DIAGNOSIS — R1312 Dysphagia, oropharyngeal phase: Secondary | ICD-10-CM | POA: Diagnosis not present

## 2018-12-11 DIAGNOSIS — R1312 Dysphagia, oropharyngeal phase: Secondary | ICD-10-CM | POA: Diagnosis not present

## 2018-12-11 DIAGNOSIS — L0291 Cutaneous abscess, unspecified: Secondary | ICD-10-CM | POA: Diagnosis not present

## 2018-12-11 DIAGNOSIS — U071 COVID-19: Secondary | ICD-10-CM | POA: Diagnosis not present

## 2018-12-11 DIAGNOSIS — R41841 Cognitive communication deficit: Secondary | ICD-10-CM | POA: Diagnosis not present

## 2018-12-11 DIAGNOSIS — R100 Acute abdomen: Secondary | ICD-10-CM | POA: Diagnosis not present

## 2018-12-12 DIAGNOSIS — R1312 Dysphagia, oropharyngeal phase: Secondary | ICD-10-CM | POA: Diagnosis not present

## 2018-12-12 DIAGNOSIS — R234 Changes in skin texture: Secondary | ICD-10-CM | POA: Diagnosis not present

## 2018-12-12 DIAGNOSIS — U071 COVID-19: Secondary | ICD-10-CM | POA: Diagnosis not present

## 2018-12-12 DIAGNOSIS — Z4682 Encounter for fitting and adjustment of non-vascular catheter: Secondary | ICD-10-CM | POA: Diagnosis not present

## 2018-12-12 DIAGNOSIS — N3289 Other specified disorders of bladder: Secondary | ICD-10-CM | POA: Diagnosis not present

## 2018-12-12 DIAGNOSIS — R1012 Left upper quadrant pain: Secondary | ICD-10-CM | POA: Diagnosis not present

## 2018-12-12 DIAGNOSIS — R41841 Cognitive communication deficit: Secondary | ICD-10-CM | POA: Diagnosis not present

## 2018-12-12 DIAGNOSIS — Z931 Gastrostomy status: Secondary | ICD-10-CM | POA: Diagnosis not present

## 2018-12-13 DIAGNOSIS — F0150 Vascular dementia without behavioral disturbance: Secondary | ICD-10-CM | POA: Diagnosis not present

## 2018-12-13 DIAGNOSIS — R1312 Dysphagia, oropharyngeal phase: Secondary | ICD-10-CM | POA: Diagnosis not present

## 2018-12-13 DIAGNOSIS — F4312 Post-traumatic stress disorder, chronic: Secondary | ICD-10-CM | POA: Diagnosis not present

## 2018-12-13 DIAGNOSIS — U071 COVID-19: Secondary | ICD-10-CM | POA: Diagnosis not present

## 2018-12-13 DIAGNOSIS — F331 Major depressive disorder, recurrent, moderate: Secondary | ICD-10-CM | POA: Diagnosis not present

## 2018-12-13 DIAGNOSIS — R41841 Cognitive communication deficit: Secondary | ICD-10-CM | POA: Diagnosis not present

## 2018-12-18 DIAGNOSIS — L0291 Cutaneous abscess, unspecified: Secondary | ICD-10-CM | POA: Diagnosis not present

## 2018-12-18 DIAGNOSIS — R100 Acute abdomen: Secondary | ICD-10-CM | POA: Diagnosis not present

## 2018-12-19 DIAGNOSIS — Z930 Tracheostomy status: Secondary | ICD-10-CM | POA: Diagnosis not present

## 2018-12-19 DIAGNOSIS — Z85818 Personal history of malignant neoplasm of other sites of lip, oral cavity, and pharynx: Secondary | ICD-10-CM | POA: Diagnosis not present

## 2018-12-19 DIAGNOSIS — C4442 Squamous cell carcinoma of skin of scalp and neck: Secondary | ICD-10-CM | POA: Diagnosis not present

## 2018-12-19 DIAGNOSIS — C770 Secondary and unspecified malignant neoplasm of lymph nodes of head, face and neck: Secondary | ICD-10-CM | POA: Diagnosis not present

## 2018-12-24 DIAGNOSIS — T849XXA Unspecified complication of internal orthopedic prosthetic device, implant and graft, initial encounter: Secondary | ICD-10-CM | POA: Diagnosis not present

## 2018-12-24 DIAGNOSIS — R5381 Other malaise: Secondary | ICD-10-CM | POA: Diagnosis not present

## 2018-12-26 ENCOUNTER — Other Ambulatory Visit (HOSPITAL_COMMUNITY): Payer: Self-pay | Admitting: Otolaryngology

## 2018-12-26 ENCOUNTER — Other Ambulatory Visit: Payer: Self-pay | Admitting: Otolaryngology

## 2018-12-26 DIAGNOSIS — C001 Malignant neoplasm of external lower lip: Secondary | ICD-10-CM

## 2018-12-26 DIAGNOSIS — R221 Localized swelling, mass and lump, neck: Secondary | ICD-10-CM

## 2019-01-02 DIAGNOSIS — J449 Chronic obstructive pulmonary disease, unspecified: Secondary | ICD-10-CM | POA: Diagnosis not present

## 2019-01-02 DIAGNOSIS — N183 Chronic kidney disease, stage 3 unspecified: Secondary | ICD-10-CM | POA: Diagnosis not present

## 2019-01-04 ENCOUNTER — Ambulatory Visit (HOSPITAL_COMMUNITY)
Admission: RE | Admit: 2019-01-04 | Discharge: 2019-01-04 | Disposition: A | Discharge status: Home / Self Care | Payer: Medicare Other | Source: Ambulatory Visit | Attending: Otolaryngology | Admitting: Otolaryngology

## 2019-01-04 ENCOUNTER — Other Ambulatory Visit: Payer: Self-pay

## 2019-01-04 DIAGNOSIS — R221 Localized swelling, mass and lump, neck: Secondary | ICD-10-CM | POA: Diagnosis present

## 2019-01-04 DIAGNOSIS — C001 Malignant neoplasm of external lower lip: Secondary | ICD-10-CM | POA: Insufficient documentation

## 2019-01-04 LAB — POCT I-STAT CREATININE: Creatinine, Ser: 1.9 mg/dL — ABNORMAL HIGH (ref 0.61–1.24)

## 2019-01-04 MED ORDER — IOHEXOL 300 MG/ML  SOLN
75.0000 mL | Freq: Once | INTRAMUSCULAR | Status: AC | PRN
  Administered 2019-01-04: 13:00:00 60 mL via INTRAVENOUS

## 2019-01-09 ENCOUNTER — Other Ambulatory Visit: Payer: Self-pay | Admitting: Otolaryngology

## 2019-01-09 DIAGNOSIS — F4312 Post-traumatic stress disorder, chronic: Secondary | ICD-10-CM | POA: Diagnosis not present

## 2019-01-09 DIAGNOSIS — F0150 Vascular dementia without behavioral disturbance: Secondary | ICD-10-CM | POA: Diagnosis not present

## 2019-01-09 DIAGNOSIS — F331 Major depressive disorder, recurrent, moderate: Secondary | ICD-10-CM | POA: Diagnosis not present

## 2019-01-10 ENCOUNTER — Ambulatory Visit (HOSPITAL_COMMUNITY): Payer: Medicare Other

## 2019-01-10 NOTE — Anesthesia Preprocedure Evaluation (Addendum)
Anesthesia Evaluation  Patient identified by MRN, date of birth, ID band Patient awake    Reviewed: Allergy & Precautions, NPO status , Patient's Chart, lab work & pertinent test results  Airway Mallampati: Trach       Dental   Pulmonary asthma , COPD, Patient abstained from smoking., former smoker,     + decreased breath sounds      Cardiovascular hypertension, +CHF   Rhythm:Regular Rate:Normal  Last echo 09/2018: - Left ventricle: The cavity size was normal. Wall thickness was   increased in a pattern of moderate LVH. Systolic function was   normal. The estimated ejection fraction was in the range of 55%   to 60%. Wall motion was normal; there were no regional wall   motion abnormalities. Doppler parameters are consistent with   abnormal left ventricular relaxation (grade 1 diastolic   dysfunction). - Aortic valve: Valve area (VTI): 3.44 cm^2. Valve area (Vmax):   3.98 cm^2. Valve area (Vmean): 3.58 cm^2. - Left atrium: The atrium was severely dilated. - Right atrium: The atrium was moderately dilated.   Neuro/Psych PSYCHIATRIC DISORDERS Anxiety Depression Dementia Very confused, forgetfulRLS    GI/Hepatic GERD  ,dysphagia   Endo/Other    Renal/GU CRFRenal diseaseCKD 3  negative genitourinary   Musculoskeletal  (+) Arthritis , Osteoarthritis,    Abdominal   Peds  Hematology  (+) anemia , Last Hct July 2020: 29   Anesthesia Other Findings Pulmonary nodules  Lip ca s/p resection  S/p radical neck dissection and trach 03/2018, G tube 04/2018. Has been doing well in nursing home, eating and drinking well PO.  Reproductive/Obstetrics negative OB ROS                            Anesthesia Physical  Anesthesia Plan  ASA: IV  Anesthesia Plan: General   Post-op Pain Management:    Induction: Intravenous  PONV Risk Score and Plan: 2 and Ondansetron and Treatment may vary due to age  or medical condition  Airway Management Planned: Tracheostomy  Additional Equipment: Arterial line  Intra-op Plan:   Post-operative Plan:   Informed Consent: I have reviewed the patients History and Physical, chart, labs and discussed the procedure including the risks, benefits and alternatives for the proposed anesthesia with the patient or authorized representative who has indicated his/her understanding and acceptance.       Plan Discussed with: CRNA  Anesthesia Plan Comments: (Large fungating mass growing in right neck. Will place additional IVs and arterial line in case of blood loss given vascularity in region of mass)       Anesthesia Quick Evaluation

## 2019-01-10 NOTE — Progress Notes (Signed)
Pre-op instructions faxed to Zena Amos, LPN at [facility]. Nurse confirmed receipt of fax and verbalized understanding of all pre-op instructions.

## 2019-01-10 NOTE — Pre-Procedure Instructions (Addendum)
    Anthony [name]  01/10/2019      [place] [place] [address] [place] [address] Phone: [phone] Fax: [phone]    Your procedure is scheduled on Thursday, January 11, 2019  Report to [facility] Admitting at 6:00 A.M.  Call this number if you have problems the morning of surgery:  [phone]   Remember: Brush your teeth the morning of surgery with your regular toothpaste.  Do not eat or drink after midnight.  Take these medicines the morning of surgery with A SIP OF WATER : acetaminophen (TYLENOL), gabapentin (NEURONTIN), omeprazole (PRILOSEC) If needed: Oxycodone  Stop taking Melatonin, Aspirin (unless otherwise advised by surgeon), vitamins, fish oil and herbal medications. Do not take any NSAIDs ie: Ibuprofen, Advil, Naproxen (Aleve), Motrin, BC and Goody Powder; stop now.    Do not wear jewelry, make-up or nail polish.  Do not wear lotions, powders, or perfumes, or deodorant.  Do not shave 48 hours prior to surgery.  Men may shave face and neck.  Do not bring valuables to the hospital.  [facility] is not responsible for any belongings or valuables.  Contacts, dentures or bridgework may not be worn into surgery.  Leave your suitcase in the car.  After surgery it may be brought to your room.  For patients admitted to the hospital, discharge time will be determined by your treatment team.  Patients discharged the day of surgery will not be allowed to drive home.   Please read over the following fact sheets that you were given.

## 2019-01-11 ENCOUNTER — Encounter (HOSPITAL_COMMUNITY)
Admission: RE | Disposition: A | Discharge status: Skilled Nursing Facility | Payer: Self-pay | Source: Home / Self Care | Attending: Otolaryngology

## 2019-01-11 ENCOUNTER — Inpatient Hospital Stay (HOSPITAL_COMMUNITY): Payer: Medicare Other | Admitting: Anesthesiology

## 2019-01-11 ENCOUNTER — Other Ambulatory Visit: Payer: Self-pay

## 2019-01-11 ENCOUNTER — Encounter (HOSPITAL_COMMUNITY): Payer: Self-pay

## 2019-01-11 ENCOUNTER — Inpatient Hospital Stay (HOSPITAL_COMMUNITY)
Admission: RE | Admit: 2019-01-11 | Discharge: 2019-01-25 | DRG: 141 | Disposition: A | Discharge status: Skilled Nursing Facility | Payer: Medicare Other | Attending: Otolaryngology | Admitting: Otolaryngology

## 2019-01-11 DIAGNOSIS — I96 Gangrene, not elsewhere classified: Secondary | ICD-10-CM | POA: Diagnosis not present

## 2019-01-11 DIAGNOSIS — D631 Anemia in chronic kidney disease: Secondary | ICD-10-CM | POA: Diagnosis not present

## 2019-01-11 DIAGNOSIS — Y846 Urinary catheterization as the cause of abnormal reaction of the patient, or of later complication, without mention of misadventure at the time of the procedure: Secondary | ICD-10-CM | POA: Diagnosis not present

## 2019-01-11 DIAGNOSIS — N401 Enlarged prostate with lower urinary tract symptoms: Secondary | ICD-10-CM | POA: Diagnosis present

## 2019-01-11 DIAGNOSIS — Z9359 Other cystostomy status: Secondary | ICD-10-CM | POA: Diagnosis not present

## 2019-01-11 DIAGNOSIS — G2581 Restless legs syndrome: Secondary | ICD-10-CM | POA: Diagnosis present

## 2019-01-11 DIAGNOSIS — J449 Chronic obstructive pulmonary disease, unspecified: Secondary | ICD-10-CM | POA: Diagnosis present

## 2019-01-11 DIAGNOSIS — C760 Malignant neoplasm of head, face and neck: Secondary | ICD-10-CM | POA: Diagnosis not present

## 2019-01-11 DIAGNOSIS — R1310 Dysphagia, unspecified: Secondary | ICD-10-CM | POA: Diagnosis not present

## 2019-01-11 DIAGNOSIS — Z7409 Other reduced mobility: Secondary | ICD-10-CM

## 2019-01-11 DIAGNOSIS — Z91018 Allergy to other foods: Secondary | ICD-10-CM

## 2019-01-11 DIAGNOSIS — Z20828 Contact with and (suspected) exposure to other viral communicable diseases: Secondary | ICD-10-CM | POA: Diagnosis present

## 2019-01-11 DIAGNOSIS — D509 Iron deficiency anemia, unspecified: Secondary | ICD-10-CM | POA: Diagnosis present

## 2019-01-11 DIAGNOSIS — D638 Anemia in other chronic diseases classified elsewhere: Secondary | ICD-10-CM | POA: Diagnosis present

## 2019-01-11 DIAGNOSIS — R5082 Postprocedural fever: Secondary | ICD-10-CM | POA: Diagnosis not present

## 2019-01-11 DIAGNOSIS — F329 Major depressive disorder, single episode, unspecified: Secondary | ICD-10-CM | POA: Diagnosis present

## 2019-01-11 DIAGNOSIS — N320 Bladder-neck obstruction: Secondary | ICD-10-CM | POA: Diagnosis present

## 2019-01-11 DIAGNOSIS — W19XXXA Unspecified fall, initial encounter: Secondary | ICD-10-CM | POA: Diagnosis not present

## 2019-01-11 DIAGNOSIS — N1831 Chronic kidney disease, stage 3a: Secondary | ICD-10-CM | POA: Diagnosis not present

## 2019-01-11 DIAGNOSIS — Z87442 Personal history of urinary calculi: Secondary | ICD-10-CM

## 2019-01-11 DIAGNOSIS — G8929 Other chronic pain: Secondary | ICD-10-CM | POA: Diagnosis present

## 2019-01-11 DIAGNOSIS — Z930 Tracheostomy status: Secondary | ICD-10-CM

## 2019-01-11 DIAGNOSIS — Z79899 Other long term (current) drug therapy: Secondary | ICD-10-CM

## 2019-01-11 DIAGNOSIS — I959 Hypotension, unspecified: Secondary | ICD-10-CM | POA: Diagnosis not present

## 2019-01-11 DIAGNOSIS — K219 Gastro-esophageal reflux disease without esophagitis: Secondary | ICD-10-CM | POA: Diagnosis present

## 2019-01-11 DIAGNOSIS — Z91030 Bee allergy status: Secondary | ICD-10-CM

## 2019-01-11 DIAGNOSIS — T83510A Infection and inflammatory reaction due to cystostomy catheter, initial encounter: Secondary | ICD-10-CM | POA: Diagnosis not present

## 2019-01-11 DIAGNOSIS — F4310 Post-traumatic stress disorder, unspecified: Secondary | ICD-10-CM | POA: Diagnosis present

## 2019-01-11 DIAGNOSIS — N138 Other obstructive and reflux uropathy: Secondary | ICD-10-CM | POA: Diagnosis present

## 2019-01-11 DIAGNOSIS — F0390 Unspecified dementia without behavioral disturbance: Secondary | ICD-10-CM | POA: Diagnosis present

## 2019-01-11 DIAGNOSIS — E440 Moderate protein-calorie malnutrition: Secondary | ICD-10-CM | POA: Diagnosis present

## 2019-01-11 DIAGNOSIS — R29810 Facial weakness: Secondary | ICD-10-CM | POA: Diagnosis not present

## 2019-01-11 DIAGNOSIS — J410 Simple chronic bronchitis: Secondary | ICD-10-CM | POA: Diagnosis not present

## 2019-01-11 DIAGNOSIS — T8131XA Disruption of external operation (surgical) wound, not elsewhere classified, initial encounter: Secondary | ICD-10-CM | POA: Diagnosis not present

## 2019-01-11 DIAGNOSIS — Z87891 Personal history of nicotine dependence: Secondary | ICD-10-CM

## 2019-01-11 DIAGNOSIS — I129 Hypertensive chronic kidney disease with stage 1 through stage 4 chronic kidney disease, or unspecified chronic kidney disease: Secondary | ICD-10-CM | POA: Diagnosis present

## 2019-01-11 DIAGNOSIS — K9420 Gastrostomy complication, unspecified: Secondary | ICD-10-CM | POA: Diagnosis not present

## 2019-01-11 DIAGNOSIS — Z85819 Personal history of malignant neoplasm of unspecified site of lip, oral cavity, and pharynx: Secondary | ICD-10-CM

## 2019-01-11 DIAGNOSIS — R338 Other retention of urine: Secondary | ICD-10-CM | POA: Diagnosis present

## 2019-01-11 DIAGNOSIS — F32A Depression, unspecified: Secondary | ICD-10-CM | POA: Diagnosis present

## 2019-01-11 DIAGNOSIS — Z79891 Long term (current) use of opiate analgesic: Secondary | ICD-10-CM

## 2019-01-11 DIAGNOSIS — N183 Chronic kidney disease, stage 3 unspecified: Secondary | ICD-10-CM | POA: Diagnosis present

## 2019-01-11 DIAGNOSIS — Z880 Allergy status to penicillin: Secondary | ICD-10-CM

## 2019-01-11 DIAGNOSIS — D649 Anemia, unspecified: Secondary | ICD-10-CM | POA: Diagnosis present

## 2019-01-11 DIAGNOSIS — Z6820 Body mass index (BMI) 20.0-20.9, adult: Secondary | ICD-10-CM

## 2019-01-11 DIAGNOSIS — N390 Urinary tract infection, site not specified: Secondary | ICD-10-CM | POA: Diagnosis not present

## 2019-01-11 DIAGNOSIS — Z931 Gastrostomy status: Secondary | ICD-10-CM

## 2019-01-11 DIAGNOSIS — Z720 Tobacco use: Secondary | ICD-10-CM | POA: Diagnosis not present

## 2019-01-11 DIAGNOSIS — C770 Secondary and unspecified malignant neoplasm of lymph nodes of head, face and neck: Secondary | ICD-10-CM | POA: Diagnosis not present

## 2019-01-11 HISTORY — PX: ROTATION FLAP: SHX6211

## 2019-01-11 HISTORY — PX: RADICAL NECK DISSECTION: SHX2284

## 2019-01-11 HISTORY — PX: PAROTIDECTOMY: SHX2163

## 2019-01-11 LAB — BASIC METABOLIC PANEL
Anion gap: 11 (ref 5–15)
BUN: 34 mg/dL — ABNORMAL HIGH (ref 8–23)
CO2: 21 mmol/L — ABNORMAL LOW (ref 22–32)
Calcium: 9 mg/dL (ref 8.9–10.3)
Chloride: 105 mmol/L (ref 98–111)
Creatinine, Ser: 2.03 mg/dL — ABNORMAL HIGH (ref 0.61–1.24)
GFR calc Af Amer: 35 mL/min — ABNORMAL LOW (ref >60)
GFR calc non Af Amer: 30 mL/min — ABNORMAL LOW (ref >60)
Glucose, Bld: 116 mg/dL — ABNORMAL HIGH (ref 70–99)
Potassium: 3.8 mmol/L (ref 3.5–5.1)
Sodium: 137 mmol/L (ref 135–145)

## 2019-01-11 LAB — CBC
HCT: 32.7 % — ABNORMAL LOW (ref 39.0–52.0)
Hemoglobin: 10 g/dL — ABNORMAL LOW (ref 13.0–17.0)
MCH: 30.1 pg (ref 26.0–34.0)
MCHC: 30.6 g/dL (ref 30.0–36.0)
MCV: 98.5 fL (ref 80.0–100.0)
Platelets: 240 10*3/uL (ref 150–400)
RBC: 3.32 MIL/uL — ABNORMAL LOW (ref 4.22–5.81)
RDW: 14.9 % (ref 11.5–15.5)
WBC: 8.2 10*3/uL (ref 4.0–10.5)
nRBC: 0 % (ref 0.0–0.2)

## 2019-01-11 LAB — GLUCOSE, CAPILLARY: Glucose-Capillary: 149 mg/dL — ABNORMAL HIGH (ref 70–99)

## 2019-01-11 LAB — TYPE AND SCREEN
ABO/RH(D): O POS
Antibody Screen: NEGATIVE

## 2019-01-11 LAB — SARS CORONAVIRUS 2 (TAT 6-24 HRS): SARS Coronavirus 2: NEGATIVE

## 2019-01-11 LAB — ABO/RH: ABO/RH(D): O POS

## 2019-01-11 SURGERY — EXCISION, PAROTID GLAND
Anesthesia: General | Site: Neck | Laterality: Right

## 2019-01-11 MED ORDER — BACITRACIN ZINC 500 UNIT/GM EX OINT
TOPICAL_OINTMENT | CUTANEOUS | Status: AC
  Filled 2019-01-11: qty 28.35

## 2019-01-11 MED ORDER — EPHEDRINE 5 MG/ML INJ
INTRAVENOUS | Status: AC
  Filled 2019-01-11: qty 10

## 2019-01-11 MED ORDER — KCL IN DEXTROSE-NACL 20-5-0.45 MEQ/L-%-% IV SOLN
INTRAVENOUS | Status: DC
  Administered 2019-01-11 – 2019-01-14 (×4): via INTRAVENOUS
  Administered 2019-01-16: 1000 mL via INTRAVENOUS
  Administered 2019-01-16 – 2019-01-21 (×7): via INTRAVENOUS
  Filled 2019-01-11 (×18): qty 1000

## 2019-01-11 MED ORDER — FENTANYL CITRATE (PF) 100 MCG/2ML IJ SOLN
INTRAMUSCULAR | Status: DC | PRN
  Administered 2019-01-11: 100 ug via INTRAVENOUS
  Administered 2019-01-11 (×3): 50 ug via INTRAVENOUS

## 2019-01-11 MED ORDER — DEXAMETHASONE SODIUM PHOSPHATE 10 MG/ML IJ SOLN
INTRAMUSCULAR | Status: AC
  Filled 2019-01-11: qty 1

## 2019-01-11 MED ORDER — LIDOCAINE 2% (20 MG/ML) 5 ML SYRINGE
INTRAMUSCULAR | Status: DC | PRN
  Administered 2019-01-11: 60 mg via INTRAVENOUS

## 2019-01-11 MED ORDER — ACETAMINOPHEN 500 MG PO TABS
1000.0000 mg | ORAL_TABLET | Freq: Once | ORAL | Status: AC
  Administered 2019-01-11: 1000 mg via ORAL
  Filled 2019-01-11: qty 2

## 2019-01-11 MED ORDER — FENTANYL CITRATE (PF) 100 MCG/2ML IJ SOLN: 25.0000 ug | INTRAMUSCULAR | Status: DC | PRN

## 2019-01-11 MED ORDER — PROPOFOL 10 MG/ML IV BOLUS
INTRAVENOUS | Status: AC
  Filled 2019-01-11: qty 20

## 2019-01-11 MED ORDER — PANTOPRAZOLE SODIUM 40 MG PO TBEC
40.0000 mg | DELAYED_RELEASE_TABLET | Freq: Every day | ORAL | Status: DC
  Administered 2019-01-12 – 2019-01-20 (×9): 40 mg via ORAL
  Filled 2019-01-11 (×9): qty 1

## 2019-01-11 MED ORDER — DEXMEDETOMIDINE HCL 200 MCG/2ML IV SOLN
INTRAVENOUS | Status: DC | PRN
  Administered 2019-01-11: 4 ug via INTRAVENOUS
  Administered 2019-01-11 (×2): 8 ug via INTRAVENOUS

## 2019-01-11 MED ORDER — DEXAMETHASONE SODIUM PHOSPHATE 10 MG/ML IJ SOLN
INTRAMUSCULAR | Status: DC | PRN
  Administered 2019-01-11: 10 mg via INTRAVENOUS

## 2019-01-11 MED ORDER — PRAMIPEXOLE DIHYDROCHLORIDE 0.125 MG PO TABS
0.1250 mg | ORAL_TABLET | Freq: Every day | ORAL | Status: DC
  Administered 2019-01-11 – 2019-01-24 (×14): 0.125 mg via ORAL
  Filled 2019-01-11 (×14): qty 1

## 2019-01-11 MED ORDER — ZINC 50 MG PO TABS: 50.0000 mg | ORAL_TABLET | Freq: Every day | ORAL | Status: DC

## 2019-01-11 MED ORDER — LACTATED RINGERS IV SOLN
INTRAVENOUS | Status: DC
  Administered 2019-01-11 (×2): via INTRAVENOUS

## 2019-01-11 MED ORDER — ROCURONIUM BROMIDE 10 MG/ML (PF) SYRINGE
PREFILLED_SYRINGE | INTRAVENOUS | Status: AC
  Filled 2019-01-11: qty 10

## 2019-01-11 MED ORDER — ARTIFICIAL TEARS OPHTHALMIC OINT
TOPICAL_OINTMENT | Freq: Every day | OPHTHALMIC | Status: DC
  Administered 2019-01-11 – 2019-01-18 (×8): via OPHTHALMIC
  Administered 2019-01-19: 1 via OPHTHALMIC
  Administered 2019-01-20 – 2019-01-24 (×5): via OPHTHALMIC
  Filled 2019-01-11 (×2): qty 3.5

## 2019-01-11 MED ORDER — 0.9 % SODIUM CHLORIDE (POUR BTL) OPTIME
TOPICAL | Status: DC | PRN
  Administered 2019-01-11: 1000 mL

## 2019-01-11 MED ORDER — BACITRACIN ZINC 500 UNIT/GM EX OINT
TOPICAL_OINTMENT | CUTANEOUS | Status: DC | PRN
  Administered 2019-01-11: 1 via TOPICAL

## 2019-01-11 MED ORDER — ROCURONIUM BROMIDE 50 MG/5ML IV SOSY
PREFILLED_SYRINGE | INTRAVENOUS | Status: DC | PRN
  Administered 2019-01-11: 50 mg via INTRAVENOUS

## 2019-01-11 MED ORDER — ONDANSETRON HCL 4 MG/2ML IJ SOLN
INTRAMUSCULAR | Status: DC | PRN
  Administered 2019-01-11: 4 mg via INTRAVENOUS

## 2019-01-11 MED ORDER — HYDROMORPHONE HCL 1 MG/ML IJ SOLN
INTRAMUSCULAR | Status: AC
  Filled 2019-01-11: qty 0.5

## 2019-01-11 MED ORDER — BACITRACIN ZINC 500 UNIT/GM EX OINT
1.0000 "application " | TOPICAL_OINTMENT | Freq: Three times a day (TID) | CUTANEOUS | Status: DC
  Administered 2019-01-11 – 2019-01-25 (×39): 1 via TOPICAL
  Filled 2019-01-11 (×2): qty 28.4

## 2019-01-11 MED ORDER — LIDOCAINE-EPINEPHRINE 1 %-1:100000 IJ SOLN
INTRAMUSCULAR | Status: DC | PRN
  Administered 2019-01-11: 20 mL

## 2019-01-11 MED ORDER — ONDANSETRON HCL 4 MG/2ML IJ SOLN
INTRAMUSCULAR | Status: AC
  Filled 2019-01-11: qty 2

## 2019-01-11 MED ORDER — MORPHINE SULFATE (PF) 2 MG/ML IV SOLN
2.0000 mg | INTRAVENOUS | Status: DC | PRN
  Administered 2019-01-11 – 2019-01-19 (×6): 2 mg via INTRAVENOUS
  Filled 2019-01-11 (×6): qty 1

## 2019-01-11 MED ORDER — CLINDAMYCIN PHOSPHATE 900 MG/50ML IV SOLN
900.0000 mg | INTRAVENOUS | Status: AC
  Administered 2019-01-11: 900 mg via INTRAVENOUS
  Filled 2019-01-11: qty 50

## 2019-01-11 MED ORDER — PROPOFOL 10 MG/ML IV BOLUS
INTRAVENOUS | Status: DC | PRN
  Administered 2019-01-11: 100 mg via INTRAVENOUS

## 2019-01-11 MED ORDER — FREE WATER
200.0000 mL | Freq: Three times a day (TID) | Status: DC
  Administered 2019-01-11 – 2019-01-24 (×37): 200 mL

## 2019-01-11 MED ORDER — EPHEDRINE SULFATE 50 MG/ML IJ SOLN: INTRAMUSCULAR | Status: DC | PRN

## 2019-01-11 MED ORDER — LIDOCAINE-EPINEPHRINE 1 %-1:100000 IJ SOLN
INTRAMUSCULAR | Status: AC
  Filled 2019-01-11: qty 1

## 2019-01-11 MED ORDER — HYPROMELLOSE (GONIOSCOPIC) 2.5 % OP SOLN
1.0000 [drp] | OPHTHALMIC | Status: DC
  Administered 2019-01-11 – 2019-01-14 (×14): 1 [drp] via OPHTHALMIC
  Filled 2019-01-11: qty 15

## 2019-01-11 MED ORDER — SERTRALINE HCL 50 MG PO TABS
50.0000 mg | ORAL_TABLET | Freq: Every day | ORAL | Status: DC
  Administered 2019-01-12 – 2019-01-25 (×13): 50 mg via ORAL
  Filled 2019-01-11 (×13): qty 1

## 2019-01-11 MED ORDER — FENTANYL CITRATE (PF) 250 MCG/5ML IJ SOLN
INTRAMUSCULAR | Status: AC
  Filled 2019-01-11: qty 5

## 2019-01-11 MED ORDER — OXYCODONE-ACETAMINOPHEN 5-325 MG PO TABS
1.0000 | ORAL_TABLET | ORAL | Status: DC | PRN
  Administered 2019-01-13 – 2019-01-14 (×3): 1 via ORAL
  Administered 2019-01-14: 2 via ORAL
  Administered 2019-01-15 – 2019-01-24 (×14): 1 via ORAL
  Filled 2019-01-11: qty 2
  Filled 2019-01-11 (×17): qty 1

## 2019-01-11 MED ORDER — HYDROMORPHONE HCL 1 MG/ML IJ SOLN
INTRAMUSCULAR | Status: DC | PRN
  Administered 2019-01-11: 0.5 mg via INTRAVENOUS

## 2019-01-11 MED ORDER — POLYETHYLENE GLYCOL 3350 17 G PO PACK
17.0000 g | PACK | Freq: Every day | ORAL | Status: DC
  Administered 2019-01-12: 17 g via ORAL
  Filled 2019-01-11: qty 1

## 2019-01-11 MED ORDER — CLINDAMYCIN PHOSPHATE 600 MG/50ML IV SOLN
600.0000 mg | Freq: Three times a day (TID) | INTRAVENOUS | Status: AC
  Administered 2019-01-11 – 2019-01-12 (×3): 600 mg via INTRAVENOUS
  Filled 2019-01-11 (×3): qty 50

## 2019-01-11 MED ORDER — LIDOCAINE 2% (20 MG/ML) 5 ML SYRINGE
INTRAMUSCULAR | Status: AC
  Filled 2019-01-11: qty 5

## 2019-01-11 MED ORDER — SUGAMMADEX SODIUM 200 MG/2ML IV SOLN
INTRAVENOUS | Status: DC | PRN
  Administered 2019-01-11: 100 mg via INTRAVENOUS

## 2019-01-11 MED ORDER — LACTATED RINGERS IV SOLN
INTRAVENOUS | Status: DC | PRN
  Administered 2019-01-11: 10:00:00 via INTRAVENOUS

## 2019-01-11 MED ORDER — PHENYLEPHRINE 40 MCG/ML (10ML) SYRINGE FOR IV PUSH (FOR BLOOD PRESSURE SUPPORT)
PREFILLED_SYRINGE | INTRAVENOUS | Status: AC
  Filled 2019-01-11: qty 10

## 2019-01-11 MED ORDER — PHENYLEPHRINE 40 MCG/ML (10ML) SYRINGE FOR IV PUSH (FOR BLOOD PRESSURE SUPPORT)
PREFILLED_SYRINGE | INTRAVENOUS | Status: DC | PRN
  Administered 2019-01-11: 80 ug via INTRAVENOUS
  Administered 2019-01-11 (×4): 120 ug via INTRAVENOUS
  Administered 2019-01-11: 80 ug via INTRAVENOUS

## 2019-01-11 MED ORDER — ADULT MULTIVITAMIN W/MINERALS CH
1.0000 | ORAL_TABLET | Freq: Every day | ORAL | Status: DC
  Administered 2019-01-12 – 2019-01-20 (×9): 1 via ORAL
  Filled 2019-01-11 (×9): qty 1

## 2019-01-11 MED ORDER — MEMANTINE HCL 5 MG PO TABS
5.0000 mg | ORAL_TABLET | Freq: Every day | ORAL | Status: DC
  Administered 2019-01-11 – 2019-01-25 (×14): 5 mg via ORAL
  Filled 2019-01-11 (×15): qty 1

## 2019-01-11 MED ORDER — GABAPENTIN 100 MG PO CAPS
100.0000 mg | ORAL_CAPSULE | Freq: Three times a day (TID) | ORAL | Status: DC
  Administered 2019-01-11 – 2019-01-18 (×20): 100 mg via ORAL
  Filled 2019-01-11 (×21): qty 1

## 2019-01-11 MED ORDER — MELATONIN 3 MG PO TABS
6.0000 mg | ORAL_TABLET | Freq: Every day | ORAL | Status: DC
  Administered 2019-01-11 – 2019-01-24 (×14): 6 mg via ORAL
  Filled 2019-01-11 (×14): qty 2

## 2019-01-11 MED ORDER — EPHEDRINE SULFATE-NACL 50-0.9 MG/10ML-% IV SOSY
PREFILLED_SYRINGE | INTRAVENOUS | Status: DC | PRN
  Administered 2019-01-11 (×2): 5 mg via INTRAVENOUS

## 2019-01-11 SURGICAL SUPPLY — 56 items
ATTRACTOMAT 16X20 MAGNETIC DRP (DRAPES) ×2 IMPLANT
BLADE SURG 15 STRL LF DISP TIS (BLADE) IMPLANT
BLADE SURG 15 STRL SS (BLADE) ×2
CANISTER SUCT 3000ML PPV (MISCELLANEOUS) IMPLANT
CLEANER TIP ELECTROSURG 2X2 (MISCELLANEOUS) ×4 IMPLANT
CONT SPEC 4OZ CLIKSEAL STRL BL (MISCELLANEOUS) ×4 IMPLANT
CORD BIPOLAR FORCEPS 12FT (ELECTRODE) ×2 IMPLANT
COVER SURGICAL LIGHT HANDLE (MISCELLANEOUS) ×4 IMPLANT
COVER WAND RF STERILE (DRAPES) ×4 IMPLANT
DRAIN PENROSE 1/4X12 LTX STRL (WOUND CARE) IMPLANT
DRAIN WOUND SNY 15 RND (WOUND CARE) ×4 IMPLANT
DRAPE HALF SHEET 40X57 (DRAPES) ×2 IMPLANT
DRSG EMULSION OIL 3X3 NADH (GAUZE/BANDAGES/DRESSINGS) IMPLANT
DRSG TELFA 3X8 NADH (GAUZE/BANDAGES/DRESSINGS) ×4 IMPLANT
ELECT COATED BLADE 2.86 ST (ELECTRODE) ×4 IMPLANT
ELECT NDL TIP 2.8 STRL (NEEDLE) IMPLANT
ELECT NEEDLE TIP 2.8 STRL (NEEDLE) ×4 IMPLANT
ELECT REM PT RETURN 9FT ADLT (ELECTROSURGICAL) ×4
ELECTRODE REM PT RTRN 9FT ADLT (ELECTROSURGICAL) ×2 IMPLANT
EVACUATOR SILICONE 100CC (DRAIN) ×4 IMPLANT
GAUZE SPONGE 4X4 12PLY STRL (GAUZE/BANDAGES/DRESSINGS) IMPLANT
GAUZE SPONGE 4X4 16PLY XRAY LF (GAUZE/BANDAGES/DRESSINGS) ×8 IMPLANT
GLOVE BIO SURGEON STRL SZ7.5 (GLOVE) ×4 IMPLANT
GOWN STRL REUS W/ TWL LRG LVL3 (GOWN DISPOSABLE) ×4 IMPLANT
GOWN STRL REUS W/TWL LRG LVL3 (GOWN DISPOSABLE) ×4
KIT BASIN OR (CUSTOM PROCEDURE TRAY) ×4 IMPLANT
KIT TURNOVER KIT B (KITS) IMPLANT
MARKER SKIN DUAL TIP RULER LAB (MISCELLANEOUS) ×2 IMPLANT
NDL HYPO 25GX1X1/2 BEV (NEEDLE) IMPLANT
NEEDLE HYPO 25GX1X1/2 BEV (NEEDLE) ×4 IMPLANT
NS IRRIG 1000ML POUR BTL (IV SOLUTION) ×4 IMPLANT
PACK UNIVERSAL I (CUSTOM PROCEDURE TRAY) ×2 IMPLANT
PAD ARMBOARD 7.5X6 YLW CONV (MISCELLANEOUS) ×8 IMPLANT
PAD DRESSING TELFA 3X8 NADH (GAUZE/BANDAGES/DRESSINGS) IMPLANT
PENCIL BUTTON HOLSTER BLD 10FT (ELECTRODE) ×4 IMPLANT
POSITIONER HEAD DONUT 9IN (MISCELLANEOUS) ×2 IMPLANT
SPONGE LAP 18X18 X RAY DECT (DISPOSABLE) ×4 IMPLANT
STAPLER VISISTAT 35W (STAPLE) ×4 IMPLANT
SUT CHROMIC 4 0 P 3 18 (SUTURE) IMPLANT
SUT ETHILON 2 0 FS 18 (SUTURE) ×4 IMPLANT
SUT ETHILON 4 0 PS 2 18 (SUTURE) IMPLANT
SUT ETHILON 5 0 P 3 18 (SUTURE)
SUT NYLON ETHILON 5-0 P-3 1X18 (SUTURE) IMPLANT
SUT SILK 2 0SH CR/8 30 (SUTURE) ×2 IMPLANT
SUT SILK 3 0 (SUTURE) ×4
SUT SILK 3 0 REEL (SUTURE) ×4 IMPLANT
SUT SILK 3-0 18XBRD TIE 12 (SUTURE) IMPLANT
SUT SILK 4 0 (SUTURE) ×2
SUT SILK 4-0 18XBRD TIE 12 (SUTURE) IMPLANT
SUT VIC AB 3-0 SH 27 (SUTURE) ×10
SUT VIC AB 3-0 SH 27X BRD (SUTURE) IMPLANT
SWAB COLLECTION DEVICE MRSA (MISCELLANEOUS) IMPLANT
SWAB CULTURE ESWAB REG 1ML (MISCELLANEOUS) IMPLANT
SYR BULB IRRIGATION 50ML (SYRINGE) ×2 IMPLANT
SYR TB 1ML LUER SLIP (SYRINGE) IMPLANT
TRAY ENT MC OR (CUSTOM PROCEDURE TRAY) ×4 IMPLANT

## 2019-01-11 NOTE — H&P (Signed)
Anthony [name] is an 78 y.o. male.   Chief Complaint: Neck cancer HPI: 78 year old male with history of lower lip and scalp cancers treated early this year.  He has more recently had a mass develop in the right upper neck that is FNA-proven SCC.  The mass has grown rapidly and fungated through the skin.  He presents for surgical management.  Past Medical History:  Diagnosis Date  . Acute pyelonephritis 12/23/2014  . Arthritis   . Asthma   . BPH (benign prostatic hyperplasia)   . Cancer ([place])    Lip cancer,has a place on the top of head  . Chronic back pain   . Chronic hip pain   . CKD (chronic kidney disease)    Stage 3  . COPD (chronic obstructive pulmonary disease) ([place])   . Dementia ([place])   . Depression   . Dysphagia   . GERD (gastroesophageal reflux disease)   . Headache   . History of kidney stones   . Hypertension    no longer on medication  . Kidney stone   . Neuropathy   . Pneumonia    november 2019  bilateral  . PTSD (post-traumatic stress disorder)   . Pulmonary nodules 12/25/2014  . Restless leg syndrome     Past Surgical History:  Procedure Laterality Date  . ABDOMINAL SURGERY     heria  . APPLICATION OF A-CELL OF HEAD/NECK N/A 09/18/2018   Procedure: Preperation of scalp wound for placement of A-Cell;  Surgeon: Wallace Going, DO;  Location: [place];  Service: Plastics;  Laterality: N/A;  . CLEFT LIP REPAIR N/A 09/18/2018   Procedure: Reconstruction of upper and lower lip defect;  Surgeon: Wallace Going, DO;  Location: [place];  Service: Plastics;  Laterality: N/A;  Total surgery time 2 hours  . ESOPHAGOGASTRODUODENOSCOPY N/A 07/06/2017   Procedure: ESOPHAGOGASTRODUODENOSCOPY (EGD);  Surgeon: Rogene Houston, MD;  [facility];  Service: Endoscopy;  Laterality: N/A;  . EYE SURGERY    . FRACTURE SURGERY  2008   hip fracture with nailing  . HERNIA REPAIR    . INCISION AND DRAINAGE Bilateral 10/02/2018   Procedure: Incision And Drainage/  Cleaning of Lip Flap with Inset of lip Flap;  Surgeon: Wallace Going, DO;  Location: [place];  Service: Plastics;  Laterality: Bilateral;  . IR CATHETER TUBE CHANGE  01/26/2018  . LAPAROSCOPIC GASTROSTOMY N/A 04/21/2018   Procedure: LAPAROSCOPIC GASTROSTOMY WITH G-TUBE PLACEMENT;  Surgeon: Kinsinger, Arta Bruce, MD;  Location: [place];  Service: General;  Laterality: N/A;  . LAPAROSCOPIC GASTROSTOMY N/A 04/20/2018   Procedure: ATTEMPTED ENDOSCOPIC GASTROSTOMY;  Surgeon: Kieth Brightly Arta Bruce, MD;  Location: [place];  Service: General;  Laterality: N/A;  . LESION EXCISION WITH COMPLEX REPAIR N/A 04/12/2018   Procedure: TOTAL LOWER LIP RESECTION;  Surgeon: Melida Quitter, MD;  Location: [place];  Service: ENT;  Laterality: N/A;  . NASAL FLAP ROTATION N/A 04/20/2018   Procedure: REVISION LOWER LIP FLAP AND SCALP FLAP;  Surgeon: Wallace Going, DO;  Location: [place];  Service: Plastics;  Laterality: N/A;  . ORAL EXCISION OF SCAR CONTRACTURE AND TISSUE ADVANCEMENT N/A 10/02/2018   Procedure: ORAL EXCISION OF SCAR CONTRACTURE AND TISSUE ADVANCEMENT;  Surgeon: Wallace Going, DO;  Location: [place];  Service: Plastics;  Laterality: N/A;  . RADICAL NECK DISSECTION Bilateral 04/12/2018   Procedure: Bilateral Radical Neck Dissection;  Surgeon: Melida Quitter, MD;  Location: [place];  Service: ENT;  Laterality:  Bilateral;  . SCALP LACERATION REPAIR N/A 04/20/2018   Procedure: WIDE SCALP EXCISION SKIN CANCER X 7 CM;  Surgeon: Melida Quitter, MD;  Location: [place];  Service: ENT;  Laterality: N/A;  . Superubic Catheter    . supubic catheter    . TRACHEOSTOMY TUBE PLACEMENT N/A 04/12/2018   Procedure: TRACHEOSTOMY;  Surgeon: Melida Quitter, MD;  [facility];  Service: ENT;  Laterality: N/A;    Family History  Family history unknown: Yes   Social History:  reports that he quit smoking about 19 months ago. His smoking use included cigarettes. He smoked 0.50 packs per day. He has never used smokeless tobacco. He  reports that he does not drink alcohol or use drugs.  Allergies:  Allergies  Allergen Reactions  . Bee Venom Anaphylaxis  . Penicillins Hives    Has patient had a PCN reaction causing immediate rash, facial/tongue/throat swelling, SOB or lightheadedness with hypotension: NO Has patient had a PCN reaction causing severe rash involving mucus membranes or skin necrosis: NO Has patient had a PCN reaction that required hospitalization: NO Has patient had a PCN reaction occurring within the last 10 years: NO If all of the above answers are "NO", then may proceed with Cephalosporin use.   . Strawberry Extract Rash    Medications Prior to Admission  Medication Sig Dispense Refill  . acetaminophen (TYLENOL) 325 MG tablet Take 650 mg by mouth 4 (four) times daily.    . Ascorbic Acid (VITAMIN C PO) Take 1 tablet by mouth daily.    Marland Kitchen gabapentin (NEURONTIN) 100 MG capsule Take 100 mg by mouth 3 (three) times daily.     Marland Kitchen MELATONIN PO Take 6 mg by mouth at bedtime.    . memantine (NAMENDA) 5 MG tablet Take 1 tablet (5 mg total) by mouth daily. (0900)    . Multiple Vitamin (MULTIVITAMIN WITH MINERALS) TABS tablet Take 1 tablet by mouth daily.    Marland Kitchen omeprazole (PRILOSEC) 20 MG capsule Take 20 mg by mouth daily.    . Oxycodone HCl 10 MG TABS Take 1 tablet by mouth every 6 hours as needed for pain.    . polyethylene glycol (MIRALAX) packet Take 17 g by mouth daily. 14 each 0  . pramipexole (MIRAPEX) 0.125 MG tablet Take 0.125 mg by mouth at bedtime. (2100)    . sertraline (ZOLOFT) 50 MG tablet Take 50 mg by mouth daily.     . Zinc 50 MG TABS Take 50 mg by mouth daily.    . Water For Irrigation, Sterile (FREE WATER) SOLN Place 200 mLs into feeding tube every 8 (eight) hours. 1000 mL 0    No results found for this or any previous visit (from the past 48 hour(s)). No results found.  Review of Systems  Unable to perform ROS: Dementia    Blood pressure 113/63, pulse 71, temperature 97.7 F (36.5 C),  temperature source Oral, resp. rate 14, height 6\' 3"  (1.905 m), weight 75.3 kg, SpO2 95 %. Physical Exam  Constitutional: He appears well-developed. No distress.  Cachectic  HENT:  Head: Atraumatic.  Right Ear: External ear normal.  Left Ear: External ear normal.  Nose: Nose normal.  Mouth/Throat: Oropharynx is clear and moist.  Scar on vertex of scalp.  Lower lip scar and defect.  Eyes: Pupils are equal, round, and reactive to light. EOM are normal.  Neck:  Large fungating mass of right upper neck.  Shiley trach in place.  Cardiovascular: Normal rate.  Respiratory: Effort  normal.  Neurological: He is alert. No cranial nerve deficit.  Not oriented.  Skin: Skin is warm and dry.  Psychiatric: He has a normal mood and affect. His behavior is normal.     Assessment/Plan Right neck cancer  To OR for excision of right neck mass and cerviopectoral flap.  Melida Quitter, MD 01/11/2019, 8:36 AM

## 2019-01-11 NOTE — Anesthesia Postprocedure Evaluation (Signed)
Anesthesia Post Note  Patient: COTTON POIROT  Procedure(s) Performed: Parotidectomy (Right Neck) Radical Neck Dissection (Right Neck) Cervicopectoral Rotation Flap (Right Neck)     Patient location during evaluation: PACU Anesthesia Type: General Level of consciousness: awake and alert, oriented and patient cooperative Pain management: pain level controlled Vital Signs Assessment: post-procedure vital signs reviewed and stable Respiratory status: spontaneous breathing, nonlabored ventilation and respiratory function stable Cardiovascular status: blood pressure returned to baseline and stable Postop Assessment: no apparent nausea or vomiting Anesthetic complications: no    Last Vitals:  Vitals:   01/11/19 1724 01/11/19 1836  BP:    Pulse:  75  Resp:  19  Temp: 36.4 C   SpO2:  95%    Last Pain:  Vitals:   01/11/19 1724  TempSrc: Oral  PainSc: Asleep   Pain Goal: Patients Stated Pain Goal: 5 (01/11/19 0759)                 Pervis Hocking

## 2019-01-11 NOTE — Brief Op Note (Signed)
01/11/2019  12:29 PM  PATIENT:  Anthony [name]  78 y.o. male  PRE-OPERATIVE DIAGNOSIS:  Right neck cancer  POST-OPERATIVE DIAGNOSIS:  Right Neck Cancer  PROCEDURE:  Procedure(s): Parotidectomy (Right) Radical Neck Dissection (Right) Cervicopectoral Rotation Flap (Right)  SURGEON:  Surgeon(s) and Role:    Redmond Baseman, Orpah Greek, MD - Primary  PHYSICIAN ASSISTANT: Sallee Provencal  ASSISTANTS: none   ANESTHESIA:   general  EBL:  200 mL   BLOOD ADMINISTERED:none  DRAINS: (15 Fr) Jackson-Pratt drain(s) with closed bulb suction in the right neck and right chest   LOCAL MEDICATIONS USED:  LIDOCAINE   SPECIMEN:  Source of Specimen:  Right neck cancer, deep margin  DISPOSITION OF SPECIMEN:  PATHOLOGY  COUNTS:  YES  TOURNIQUET:  * No tourniquets in log *  DICTATION: .Other Dictation: Dictation Number [id number]  PLAN OF CARE: Admit to inpatient   PATIENT DISPOSITION:  PACU - hemodynamically stable.   Delay start of Pharmacological VTE agent (>24hrs) due to surgical blood loss or risk of bleeding: yes

## 2019-01-11 NOTE — Progress Notes (Addendum)
   Subjective:    Patient ID: Anthony [name], male    DOB: 08-Apr-1940, 78 y.o.   MRN: [id number]  HPI No complaints.  Sleeping but awakens easily.  Review of Systems     Objective:   Physical Exam AF VSS Right neck and chest incision clean and intact, skin healthy, no fluid collections, both drains functioning Right CN VII weak on right in all divisons    Assessment & Plan:  Right radical neck dissection, parotidectomy, and cervicopectoral flap  The flap looks good.  Drains are functioning.  There is a broader right CN VII weakness likely related to cautery near the stylomastoid foramen during tumor removal.  Lower division paralysis is expected.  Hopefully, the upper division will recover with time.  Will initiate right eye tears and lube.  Observe in progressive unit.

## 2019-01-11 NOTE — Anesthesia Procedure Notes (Signed)
Procedure Name: Intubation Date/Time: 01/11/2019 9:17 AM Performed by: Trinna Post., CRNA Pre-anesthesia Checklist: Patient identified, Emergency Drugs available, Suction available, Patient being monitored and Timeout performed Patient Re-evaluated:Patient Re-evaluated prior to induction Oxygen Delivery Method: Circle system utilized Preoxygenation: Pre-oxygenation with 100% oxygen Induction Type: IV induction and Tracheostomy Ventilation: Mask ventilation without difficulty Tube type: Oral Tube size: 6.0 mm Number of attempts: 1 Placement Confirmation: positive ETCO2 and breath sounds checked- equal and bilateral Tube secured with: Tape Dental Injury: Teeth and Oropharynx as per pre-operative assessment  Comments: Uncuffed 5.0 trach removed and replaced with 6.0 ETT in trach site with no complications

## 2019-01-11 NOTE — Anesthesia Procedure Notes (Signed)
Arterial Line Insertion Start/End10/29/2020 9:30 AM Performed by: Verdie Drown, CRNA, CRNA  Patient location: OR. Preanesthetic checklist: patient identified, IV checked, site marked, risks and benefits discussed, surgical consent, monitors and equipment checked, pre-op evaluation, timeout performed and anesthesia consent Lidocaine 1% used for infiltration and patient sedated Left, radial was placed Catheter size: 20 G Hand hygiene performed  and maximum sterile barriers used   Attempts: 1 Procedure performed without using ultrasound guided technique. Following insertion, dressing applied and Biopatch. Post procedure assessment: normal  Patient tolerated the procedure well with no immediate complications.

## 2019-01-11 NOTE — Transfer of Care (Signed)
Immediate Anesthesia Transfer of Care Note  Patient: Anthony [name]  Procedure(s) Performed: Parotidectomy (Right Neck) Radical Neck Dissection (Right Neck) Cervicopectoral Rotation Flap (Right Neck)  Patient Location: PACU  Anesthesia Type:General  Level of Consciousness: awake, alert  and confused  Airway & Oxygen Therapy: Patient Spontanous Breathing and Patient connected to face mask oxygen  Post-op Assessment: Report given to RN and Post -op Vital signs reviewed and stable  Post vital signs: Reviewed and stable  Last Vitals:  Vitals Value Taken Time  BP 99/58 01/11/19 1243  Temp 36.1 C 01/11/19 1243  Pulse 74 01/11/19 1252  Resp 10 01/11/19 1252  SpO2 95 % 01/11/19 1252  Vitals shown include unvalidated device data.  Last Pain:  Vitals:   01/11/19 1243  TempSrc:   PainSc: Asleep      Patients Stated Pain Goal: 5 ([phone])  Complications: No apparent anesthesia complications

## 2019-01-12 ENCOUNTER — Encounter (HOSPITAL_COMMUNITY): Payer: Self-pay | Admitting: Otolaryngology

## 2019-01-12 LAB — MRSA PCR SCREENING: MRSA by PCR: POSITIVE — AB

## 2019-01-12 MED ORDER — CHLORHEXIDINE GLUCONATE CLOTH 2 % EX PADS
6.0000 | MEDICATED_PAD | Freq: Every day | CUTANEOUS | Status: AC
  Administered 2019-01-12 – 2019-01-16 (×4): 6 via TOPICAL

## 2019-01-12 MED ORDER — MUPIROCIN 2 % EX OINT
1.0000 "application " | TOPICAL_OINTMENT | Freq: Two times a day (BID) | CUTANEOUS | Status: AC
  Administered 2019-01-12 – 2019-01-16 (×10): 1 via NASAL
  Filled 2019-01-12: qty 22

## 2019-01-12 MED ORDER — PRO-STAT SUGAR FREE PO LIQD
30.0000 mL | Freq: Two times a day (BID) | ORAL | Status: DC
  Administered 2019-01-12 – 2019-01-25 (×26): 30 mL
  Filled 2019-01-12 (×23): qty 30

## 2019-01-12 MED ORDER — OSMOLITE 1.5 CAL PO LIQD
1000.0000 mL | ORAL | Status: DC
  Administered 2019-01-12 – 2019-01-24 (×10): 1000 mL
  Filled 2019-01-12 (×20): qty 1000

## 2019-01-12 MED ORDER — BOOST / RESOURCE BREEZE PO LIQD CUSTOM
1.0000 | Freq: Three times a day (TID) | ORAL | Status: DC
  Administered 2019-01-12 – 2019-01-24 (×15): 1 via ORAL

## 2019-01-12 NOTE — Progress Notes (Signed)
   Subjective:    Patient ID: Anthony [name], male    DOB: 1940/12/26, 78 y.o.   MRN: [id number]  HPI Has been sleeping mostly since last evening.  Complains of some pain in surgical site.  Review of Systems     Objective:   Physical Exam AF VSS  Drains 80 cc combined Sleeping but awakens easily Right neck and chest incision clean and intact, no fluid collection, drains functioning, skin flap healthy Right CN VII remains weak in all divisions Right CN XI with some good strength     Assessment & Plan:  Right neck cancer s/p RND, parotidectomy, and cervicopectoral flap  Doing well.  Will continue in Progressive unit.  D/C A-line and advance diet by mouth.  Tube feedings also.  OOB to chair today.

## 2019-01-12 NOTE — Progress Notes (Addendum)
Initial Nutrition Assessment  DOCUMENTATION CODES:   Non-severe (moderate) malnutrition in context of chronic illness  INTERVENTION:   Tube feeding: - Osmolite 1.5 @ 50 ml/hr (1200 ml/day) via G-tube - Pro-stat 30 ml BID - Free water per MD  Tube feeding regimen and current free water provides 2000 kcal, 105 grams of protein, and 1514 ml of H2O.   - Boost Breeze po TID, each supplement provides 250 kcal and 9 grams of protein  NUTRITION DIAGNOSIS:   Moderate Malnutrition related to chronic illness (neck cancer, COPD, dementia) as evidenced by mild fat depletion, moderate fat depletion, moderate muscle depletion.  GOAL:   Patient will meet greater than or equal to 90% of their needs  MONITOR:   PO intake, Supplement acceptance, TF tolerance, Diet advancement, Skin, Weight trends, I & O's  REASON FOR ASSESSMENT:   Consult Enteral/tube feeding initiation and management  ASSESSMENT:   78 year old male who presented on 10/29. PMH neck cancer, lower lip and scalp cancers, CKD stage III, COPD, dementia, dysphagia, GERD, HTN. S/p parotidectomy, radical neck dissection, cervical pectoral rotation flap on 10/29.   RD consulted for TF initiation and management. Pt currently on clear liquids. Pt on TC.  MD has ordered free water 200 ml q 8 hours.  Discussed pt with RN. Per RN, pt can swallow water without issue. RN reports that caregiver shared that pt eats "whatever he wants" at home including bologna sandwiches.  Met with pt briefly at bedside. Pt sleeping but awoke to RD voice. Pt unable to provide diet and weight history.  Reviewed weight history in chart. Noted pt's weight has increased from 64 kg to 76 kg over the last 8 months.  Medications reviewed and include: MVI with minerals daily, Protonix, Miralax IVF: D5 and 1/2NS with KCl @ 75 ml/hr  Labs reviewed. CBG's: 149  UOP: 1000 ml x 24 hours JP drains: 80 ml x 24 hours I/O's: +750 ml since admit  NUTRITION -  FOCUSED PHYSICAL EXAM:    Most Recent Value  Orbital Region  Moderate depletion  Upper Arm Region  Mild depletion  Thoracic and Lumbar Region  Moderate depletion  Buccal Region  Moderate depletion  Temple Region  Moderate depletion  Clavicle Bone Region  Moderate depletion  Clavicle and Acromion Bone Region  Moderate depletion  Scapular Bone Region  Moderate depletion  Dorsal Hand  Unable to assess  Patellar Region  Moderate depletion  Anterior Thigh Region  Moderate depletion  Posterior Calf Region  Moderate depletion  Edema (RD Assessment)  None  Hair  Reviewed  Eyes  Reviewed  Mouth  Reviewed  Skin  Reviewed  Nails  Reviewed       Diet Order:   Diet Order            Diet clear liquid Room service appropriate? Yes; Fluid consistency: Thin  Diet effective now              EDUCATION NEEDS:   Not appropriate for education at this time  Skin:  Skin Assessment: Skin Integrity Issues: Skin Integrity Issues: Incisions: closed incision to neck  Last BM:  no documented BM  Height:   Ht Readings from Last 1 Encounters:  01/11/19 '6\' 3"'  (1.905 m)    Weight:   Wt Readings from Last 1 Encounters:  01/11/19 75.3 kg    Ideal Body Weight:  89.1 kg  BMI:  Body mass index is 20.75 kg/m.  Estimated Nutritional Needs:   Kcal:  2000-2200  Protein:  100-115 grams  Fluid:  >/= 1.8 L    Gaynell Face, MS, RD, LDN Inpatient Clinical Dietitian Pager: [phone] Weekend/After Hours: [phone]

## 2019-01-12 NOTE — Op Note (Signed)
NAME: Anthony [name], Anthony [name] MEDICAL RECORD [id number] ACCOUNT [id number] DATE OF BIRTH:04/06/1940 FACILITY: [facility] LOCATION: [facility] PHYSICIAN:Ladeidra Borys Guido Sander, MD  OPERATIVE REPORT  DATE OF PROCEDURE:  01/11/2019  PREOPERATIVE DIAGNOSIS:  Right neck cancer.  POSTOPERATIVE DIAGNOSIS:  Right neck cancer.  PROCEDURES: 1.  Right radical neck dissection. 2.  Right parotidectomy without nerve dissection. 3.  Right cervical pectoral rotational flap.  SURGEON:  Melida Quitter, MD.  ASSISTANTJolene Provost, PA, who was necessary for assistance with retraction, dissection, and wound closure.  ANESTHESIA:  General endotracheal anesthesia.  COMPLICATIONS:  None.  INDICATIONS:  The patient is an 78 year old male who underwent lower lip resection at the beginning of this year for a large lower lip skin cancer, along with a right neck dissection.  He did not go through radiation treatments as he suffers from  dementia and had a long process of trying to repair the lower lip that ultimately failed and resulted in scarring of the lower lip.  More recently, he has developed a mass in the right upper neck that was found to be squamous cell carcinoma by fine  needle aspiration.  The mass has rapidly worsened and fungated out of the neck at this point.  He presents to the operating room for surgical management.  FINDINGS:  There was a large mass in right zone 2 region of the neck that had fungated through the skin and involved the inferior parotid gland with a ____.   Removal required sacrifice of a portion of the internal jugular vein, the spinal accessory  nerve, and the marginal mandibular nerve.  Deep margin tissue was sent for frozen section and found to be negative.  DESCRIPTION OF PROCEDURE:  The patient was identified in the holding room, informed consent having been obtained from his social worker, including discussion of risks, benefits, and alternatives.  The patient was brought to the  operative suite and put on  the operative table in supine position.  Anesthesia was induced and the patient's tracheostomy tube was removed and replaced by a cuffed endotracheal tube for the case.  He had an indwelling Foley catheter already.  The right neck incision, including a  resection incision around the mass was marked with a marking pen.  This also included the incision marked out for the cervical pectoral flap.  All these incision sites were then injected with 1% lidocaine with 1:100,000 epinephrine.  The right neck and  chest were prepped and draped in sterile fashion.  Incision was made with a 15 blade scalpel, starting around the mass with a generous margin of skin.  This incision was extended anteriorly in the previous neck incision scar slightly, as well as  superiorly in the preauricular groove to allow exposure of the facial nerve region.  The skin was elevated around the mass in all directions using Bovie electrocautery.  The anterior skin flap was sewn back with stay sutures, as was the earlobe.   Dissection was then performed around the mass, first anteriorly and superiorly where it involved the parotid gland.  Initially, some dissection was performed along the tragal cartilage to start to approach the main trunk of the nerve.  It was felt that  the mass did not approach the main trunk and that the marginal mandibular nerve had to be sacrificed along with the mass, so facial nerve dissection was not performed.  The marginal mandibular nerve was felt to not be so important, particularly because  of his lower lip defect and scarring.  So the parotid gland was divided, controlling bleeding with ligation and cautery down to the masseter muscle and then under the edge of the mandible to the posterior belly of the digastric muscle.  Dissection was  continued then inferiorly around the anterior part of the mass and then inferiorly to the sternocleidomastoid muscle as well as posteriorly to the  same.  Some of the muscle was resected with the mass, keeping a margin of tissue around the mass using  Bovie electrocautery, including dividing the muscle from the mastoid tip.  Inferior to the mass, the internal jugular vein was identified and found to be adherent to the deep part of the mass at that location, so it was divided and ligated using silk  suture.  Moving a bit superiorly from the mass, the vein was again free and was ligated and divided there as well.  This allowed the mass to be rolled posteriorly somewhat and it was able to be dissected then through the sternocleidomastoid muscle and  away from the carotid artery which was not really seen in the surgery.  Continued dissection then along the posterior belly of the digastric muscle was performed, noting that the mass dove toward the stylomastoid process.  It was able to be dissected  free in that location using Bovie electrocautery and this allowed removal of the mass.  Of note, the dissecting over the mass from the posterior belly of the digastric required sacrifice of the spinal accessory nerve as well.  So the deep tissue near the  stylomastoid foramen was then removed with scissors and sent for frozen section and found to be negative for carcinoma.  At this point, the cervical pectoral incision was made with a 10 blade scalpel following the lateral neck, down to the deltopectoral  groove and then inferiorly to above the nipple and then medially at that location.  The skin flap was then elevated in a subcutaneous plane with generous subcutaneous fat left attached to the flap.  This was elevated medially to allow rotation of the  skin up into the defect.  The very distal extent of the flap was pointed and this was removed with a 15 blade scalpel, allowing the flap then to fit into the defect nicely.  The entire wound, including the donor site was then copiously irrigated with  saline.  Bleeding was controlled with bipolar electrocautery and  ligation.  A 15-French drain was then placed in the right neck and a second in the right chest and secured to the skin using standard drain stitch through separate punctures.  The flap was  then secured, closing the skin and the subcutaneous layer using 3-0 Vicryl suture in a combined simple interrupted and simple running fashion.  The preauricular incision was closed with simple running fashion of the same suture.  The skin was then fully  closed with staples.  The patient was cleaned off and drapes were removed.  Bacitracin ointment was added to all the incision sites.  The drains were hooked to bulb suction and activated.  These were taped to him as well.  At this point, the patient was  returned to anesthesia for wakeup.  His endotracheal tube was exchanged for his cuffless trach tube at wakeup.  An arterial line was left in place for recovery and he was moved to the recovery room in stable condition.  VN/NUANCE  D:01/11/2019 T:01/11/2019 JOB:[id number]

## 2019-01-13 ENCOUNTER — Inpatient Hospital Stay (HOSPITAL_COMMUNITY): Payer: Medicare Other

## 2019-01-13 DIAGNOSIS — E440 Moderate protein-calorie malnutrition: Secondary | ICD-10-CM | POA: Diagnosis present

## 2019-01-13 LAB — CBC
HCT: 26.9 % — ABNORMAL LOW (ref 39.0–52.0)
Hemoglobin: 8.1 g/dL — ABNORMAL LOW (ref 13.0–17.0)
MCH: 29.8 pg (ref 26.0–34.0)
MCHC: 30.1 g/dL (ref 30.0–36.0)
MCV: 98.9 fL (ref 80.0–100.0)
Platelets: 273 10*3/uL (ref 150–400)
RBC: 2.72 MIL/uL — ABNORMAL LOW (ref 4.22–5.81)
RDW: 14.9 % (ref 11.5–15.5)
WBC: 11 10*3/uL — ABNORMAL HIGH (ref 4.0–10.5)
nRBC: 0 % (ref 0.0–0.2)

## 2019-01-13 LAB — CREATININE, SERUM
Creatinine, Ser: 1.8 mg/dL — ABNORMAL HIGH (ref 0.61–1.24)
GFR calc Af Amer: 40 mL/min — ABNORMAL LOW (ref >60)
GFR calc non Af Amer: 35 mL/min — ABNORMAL LOW (ref >60)

## 2019-01-13 MED ORDER — DIATRIZOATE MEGLUMINE & SODIUM 66-10 % PO SOLN
25.0000 mL | ORAL | Status: AC
  Administered 2019-01-13: 25 mL via ORAL
  Filled 2019-01-13: qty 30

## 2019-01-13 MED ORDER — ORAL CARE MOUTH RINSE
15.0000 mL | Freq: Two times a day (BID) | OROMUCOSAL | Status: DC
  Administered 2019-01-18 – 2019-01-24 (×13): 15 mL via OROMUCOSAL

## 2019-01-13 MED ORDER — ENOXAPARIN SODIUM 30 MG/0.3ML ~~LOC~~ SOLN
30.0000 mg | SUBCUTANEOUS | Status: DC
  Administered 2019-01-13: 09:00:00 30 mg via SUBCUTANEOUS
  Filled 2019-01-13: qty 0.3

## 2019-01-13 MED ORDER — CHLORHEXIDINE GLUCONATE 0.12 % MT SOLN
15.0000 mL | Freq: Two times a day (BID) | OROMUCOSAL | Status: DC
  Administered 2019-01-14 – 2019-01-25 (×17): 15 mL via OROMUCOSAL
  Filled 2019-01-13 (×19): qty 15

## 2019-01-13 NOTE — Progress Notes (Signed)
Gastrostomy tube [facility] for use for tube feeds and meds.  Imogene Burn. Georgette Dover, MD, [facility]  General/ Trauma Surgery   01/13/2019 6:22 PM

## 2019-01-13 NOTE — Progress Notes (Signed)
   Subjective:    Patient ID: Anthony [name], male    DOB: February 11, 1941, 78 y.o.   MRN: [id number]  Did well overnight.  Pain seems to be controlled.  No issues.  States he is having bowel movements and getting out of bed during the day.  No complaints.  Sleeping but awakens easily.  Review of Systems  Constitutional: Negative.   HENT: Negative.   Respiratory: Negative.   Neurological: Negative.   Psychiatric/Behavioral: Negative.        Objective:   Physical Exam Constitutional:      General: He is not in acute distress. HENT:     Head: Normocephalic and atraumatic.     Nose: Nose normal.     Mouth/Throat:     Mouth: Mucous membranes are moist.     Pharynx: Oropharynx is clear.  Eyes:     Extraocular Movements: Extraocular movements intact.     Conjunctiva/sclera: Conjunctivae normal.     Pupils: Pupils are equal, round, and reactive to light.  Neck:     Comments: Right neck incision is clean/dry/intact.  Neck JP drain is with serosanguineous output.  Chest wall is clean/dry/intact and JP drain with serosanguineous output. Cardiovascular:     Pulses: Normal pulses.  Pulmonary:     Effort: Pulmonary effort is normal.     Breath sounds: Normal breath sounds.  Abdominal:     General: Abdomen is flat. There is no distension.     Palpations: Abdomen is soft.     Tenderness: There is no abdominal tenderness.     Comments: G-tube site looks healthy.  Musculoskeletal:        General: No swelling, tenderness or deformity.     Right lower leg: No edema.     Left lower leg: No edema.  Skin:    General: Skin is warm and dry.     Capillary Refill: Capillary refill takes less than 2 seconds.     Findings: No bruising or erythema.  Neurological:     General: No focal deficit present.     Mental Status: He is alert and oriented to person, place, and time.  Psychiatric:        Mood and Affect: Mood normal.        Behavior: Behavior normal.    Right CN VII weak on right in all  divisons    Assessment & Plan:  Patient is status post right radical neck dissection, parotidectomy, and cervicopectoral flap, POD #2  The flap looks good.  Drains are functioning.  There is a broader right CN VII weakness likely related to cautery near the stylomastoid foramen during tumor removal.  Lower division paralysis is expected.  Hopefully, the upper division will recover with time.  Will initiate right eye tears and lube.  Observe in progressive unit. -Work on getting out of bed G-tube feeds going back to baseline, appreciate dietitian recommendations for feeding. He may have some clear liquids as tolerated We will start him on some prophylactic Lovenox.    Helayne Seminole, MD

## 2019-01-13 NOTE — Consult Note (Signed)
Reason for Consult:  Possible displaced gastrostomy tube Referring Physician: Butler Denmark is an 78 y.o. male.  HPI: This is an 78 year old male with multiple medical issues who is s/p laparoscopic-assisted gastrostomy tube placement in February 2020 by Dr. Kieth Brightly.  He is two days post-op from a radical neck dissection/ flap closure by Dr. Redmond Baseman.  The nurse noted this morning that the gastrostomy tube was quite loose with the flange several centimeters from the skin.  The stay sutures also seem loose.  He has been receiving tube feeds without any problems, but these are currently held.  No sign of peritonitis or sepsis.  Past Medical History:  Diagnosis Date  . Acute pyelonephritis 12/23/2014  . Arthritis   . Asthma   . BPH (benign prostatic hyperplasia)   . Cancer ([place])    Lip cancer,has a place on the top of head  . Chronic back pain   . Chronic hip pain   . CKD (chronic kidney disease)    Stage 3  . COPD (chronic obstructive pulmonary disease) ([place])   . Dementia ([place])   . Depression   . Dysphagia   . GERD (gastroesophageal reflux disease)   . Headache   . History of kidney stones   . Hypertension    no longer on medication  . Kidney stone   . Neuropathy   . Pneumonia    november 2019  bilateral  . PTSD (post-traumatic stress disorder)   . Pulmonary nodules 12/25/2014  . Restless leg syndrome     Past Surgical History:  Procedure Laterality Date  . ABDOMINAL SURGERY     heria  . APPLICATION OF A-CELL OF HEAD/NECK N/A 09/18/2018   Procedure: Preperation of scalp wound for placement of A-Cell;  Surgeon: Wallace Going, DO;  Location: [facility];  Service: Plastics;  Laterality: N/A;  . CLEFT LIP REPAIR N/A 09/18/2018   Procedure: Reconstruction of upper and lower lip defect;  Surgeon: Wallace Going, DO;  Location: [facility];  Service: Plastics;  Laterality: N/A;  Total surgery time 2 hours  . ESOPHAGOGASTRODUODENOSCOPY N/A 07/06/2017   Procedure:  ESOPHAGOGASTRODUODENOSCOPY (EGD);  Surgeon: Rogene Houston, MD;  [facility];  Service: Endoscopy;  Laterality: N/A;  . EYE SURGERY    . FRACTURE SURGERY  2008   hip fracture with nailing  . HERNIA REPAIR    . INCISION AND DRAINAGE Bilateral 10/02/2018   Procedure: Incision And Drainage/ Cleaning of Lip Flap with Inset of lip Flap;  Surgeon: Wallace Going, DO;  Location: [facility];  Service: Plastics;  Laterality: Bilateral;  . IR CATHETER TUBE CHANGE  01/26/2018  . LAPAROSCOPIC GASTROSTOMY N/A 04/21/2018   Procedure: LAPAROSCOPIC GASTROSTOMY WITH G-TUBE PLACEMENT;  Surgeon: Kinsinger, Arta Bruce, MD;  Location: [facility];  Service: General;  Laterality: N/A;  . LAPAROSCOPIC GASTROSTOMY N/A 04/20/2018   Procedure: ATTEMPTED ENDOSCOPIC GASTROSTOMY;  Surgeon: Kieth Brightly Arta Bruce, MD;  Location: [facility];  Service: General;  Laterality: N/A;  . LESION EXCISION WITH COMPLEX REPAIR N/A 04/12/2018   Procedure: TOTAL LOWER LIP RESECTION;  Surgeon: Melida Quitter, MD;  Location: [facility];  Service: ENT;  Laterality: N/A;  . NASAL FLAP ROTATION N/A 04/20/2018   Procedure: REVISION LOWER LIP FLAP AND SCALP FLAP;  Surgeon: Wallace Going, DO;  Location: [facility];  Service: Plastics;  Laterality: N/A;  . ORAL EXCISION OF SCAR CONTRACTURE AND TISSUE ADVANCEMENT N/A 10/02/2018   Procedure: ORAL EXCISION OF SCAR CONTRACTURE AND  TISSUE ADVANCEMENT;  Surgeon: Wallace Going, DO;  Location: [facility];  Service: Plastics;  Laterality: N/A;  . PAROTIDECTOMY Right 01/11/2019   Procedure: Parotidectomy;  Surgeon: Melida Quitter, MD;  Location: [facility];  Service: ENT;  Laterality: Right;  . RADICAL NECK DISSECTION Bilateral 04/12/2018   Procedure: Bilateral Radical Neck Dissection;  Surgeon: Melida Quitter, MD;  Location: [facility];  Service: ENT;  Laterality: Bilateral;  . RADICAL NECK DISSECTION Right 01/11/2019   Procedure: Radical Neck Dissection;  Surgeon: Melida Quitter, MD;  Location: [facility];  Service: ENT;   Laterality: Right;  . ROTATION FLAP Right 01/11/2019   Procedure: Cervicopectoral Rotation Flap;  Surgeon: Melida Quitter, MD;  Location: [facility];  Service: ENT;  Laterality: Right;  . SCALP LACERATION REPAIR N/A 04/20/2018   Procedure: WIDE SCALP EXCISION SKIN CANCER X 7 CM;  Surgeon: Melida Quitter, MD;  Location: [facility];  Service: ENT;  Laterality: N/A;  . Superubic Catheter    . supubic catheter    . TRACHEOSTOMY TUBE PLACEMENT N/A 04/12/2018   Procedure: TRACHEOSTOMY;  Surgeon: Melida Quitter, MD;  Location: [facility];  Service: ENT;  Laterality: N/A;    Family History  Family history unknown: Yes    Social History:  reports that he quit smoking about 19 months ago. His smoking use included cigarettes. He smoked 0.50 packs per day. He has never used smokeless tobacco. He reports that he does not drink alcohol or use drugs.  Allergies:  Allergies  Allergen Reactions  . Bee Venom Anaphylaxis  . Penicillins Hives    Has patient had a PCN reaction causing immediate rash, facial/tongue/throat swelling, SOB or lightheadedness with hypotension: NO Has patient had a PCN reaction causing severe rash involving mucus membranes or skin necrosis: NO Has patient had a PCN reaction that required hospitalization: NO Has patient had a PCN reaction occurring within the last 10 years: NO If all of the above answers are "NO", then may proceed with Cephalosporin use.   . Strawberry Extract Rash    Medications:  Prior to Admission medications   Medication Sig Start Date End Date Taking? Authorizing Provider  acetaminophen (TYLENOL) 325 MG tablet Take 650 mg by mouth 4 (four) times daily.   Yes [provider]  Ascorbic Acid (VITAMIN C PO) Take 1 tablet by mouth daily.   Yes [provider]  gabapentin (NEURONTIN) 100 MG capsule Take 100 mg by mouth 3 (three) times daily.    Yes [provider]  MELATONIN PO Take 6 mg by mouth at bedtime.   Yes [provider]  memantine  (NAMENDA) 5 MG tablet Take 1 tablet (5 mg total) by mouth daily. (0900) 04/28/18  Yes Dessa Phi, DO  Multiple Vitamin (MULTIVITAMIN WITH MINERALS) TABS tablet Take 1 tablet by mouth daily.   Yes [provider]  omeprazole (PRILOSEC) 20 MG capsule Take 20 mg by mouth daily. 12/23/18  Yes [provider]  Oxycodone HCl 10 MG TABS Take 1 tablet by mouth every 6 hours as needed for pain. 09/28/18  Yes [provider]  polyethylene glycol (MIRALAX) packet Take 17 g by mouth daily. 10/14/17  Yes Kathie Dike, MD  pramipexole (MIRAPEX) 0.125 MG tablet Take 0.125 mg by mouth at bedtime. (2100)   Yes [provider]  sertraline (ZOLOFT) 50 MG tablet Take 50 mg by mouth daily.    Yes [provider]  Zinc 50 MG TABS Take 50 mg by mouth daily.   Yes [provider]  Water For Irrigation, Sterile (FREE WATER) SOLN Place 200 mLs into feeding tube every 8 (eight) hours. 04/28/18   Dessa Phi, DO     Results for orders placed or performed during the hospital encounter of 01/11/19 (from the past 48 hour(s))  SARS CORONAVIRUS 2 (TAT 6-24 HRS) Nasopharyngeal Nasopharyngeal Swab     Status: None   Collection Time: 01/11/19  5:30 PM   Specimen: Nasopharyngeal Swab  Result Value Ref Range   SARS Coronavirus 2 NEGATIVE NEGATIVE    Comment: (NOTE) SARS-CoV-2 target nucleic acids are NOT DETECTED. The SARS-CoV-2 RNA is generally detectable in upper and lower respiratory specimens during the acute phase of infection. Negative results do not preclude SARS-CoV-2 infection, do not rule out co-infections with other pathogens, and should not be used as the sole basis for treatment or other patient management decisions. Negative results must be combined with clinical observations, patient history, and epidemiological information. The expected result is Negative. Fact Sheet for Patients: SugarRoll.be Fact Sheet for Healthcare  Providers: https://www.woods-mathews.com/ This test is not yet approved or cleared by the [place] FDA and  has been authorized for detection and/or diagnosis of SARS-CoV-2 by FDA under an Emergency Use Authorization (EUA). This EUA will remain  in effect (meaning this test can be used) for the duration of the COVID-19 declaration under Section 56 4(b)(1) of the Act, 21 U.S.C. section 360bbb-3(b)(1), unless the authorization is terminated or revoked sooner. Performed at [facility], [place] [address], [place], [place] [address]   Glucose, capillary     Status: Abnormal   Collection Time: 01/11/19  8:58 PM  Result Value Ref Range   Glucose-Capillary 149 (H) 70 - 99 mg/dL  MRSA PCR Screening     Status: Abnormal   Collection Time: 01/12/19 12:54 AM   Specimen: Nasopharyngeal  Result Value Ref Range   MRSA by PCR POSITIVE (A) NEGATIVE    Comment:        The GeneXpert MRSA Assay (FDA approved for NASAL specimens only), is one component of a comprehensive MRSA colonization surveillance program. It is not intended to diagnose MRSA infection nor to guide or monitor treatment for MRSA infections. RESULT CALLED TO, READ BACK BY AND VERIFIED WITH: NEWCOMER,S RN 01/12/2019 AT TH:[id number] SKEEN,P Performed at [facility], [place] [address] [place], [place] [address]   CBC     Status: Abnormal   Collection Time: 01/13/19  9:59 AM  Result Value Ref Range   WBC 11.0 (H) 4.0 - 10.5 K/uL   RBC 2.72 (L) 4.22 - 5.81 MIL/uL   Hemoglobin 8.1 (L) 13.0 - 17.0 g/dL   HCT 26.9 (L) 39.0 - 52.0 %   MCV 98.9 80.0 - 100.0 fL   MCH 29.8 26.0 - 34.0 pg   MCHC 30.1 30.0 - 36.0 g/dL   RDW 14.9 11.5 - 15.5 %   Platelets 273 150 - 400 K/uL   nRBC 0.0 0.0 - 0.2 %    Comment: Performed at [facility], [place] [address] [place], [place] [address]  Creatinine, serum     Status: Abnormal   Collection Time: 01/13/19  9:59 AM  Result Value Ref Range   Creatinine, Ser 1.80 (H) 0.61 -  1.24 mg/dL   GFR calc non Af Amer 35 (L) >60 mL/min   GFR calc Af Amer 40 (L) >60 mL/min    Comment: Performed at [facility] [address] [place], [place] [address]  Dg Abd Portable 1v  Result Date: 01/13/2019 CLINICAL DATA:  S/P percutaneous endoscopic gastrostomy (PEG) tube placement. Ordering physician specified they did not want contrast used for this study. Hx of HTN, CKD. EXAM: PORTABLE ABDOMEN - 1 VIEW COMPARISON:  09/28/2018 FINDINGS: Gastrostomy tube projects over the central abdomen. Normal bowel gas pattern. Hernia mesh noted superimposed over the right lower abdomen. IMPRESSION: 1. No acute findings. 2. Presumed gastrostomy tube projects over the mid abdomen. Electronically Signed   By: Lajean Manes M.D.   On: 01/13/2019 10:51    ROS  Unable to obtain - patient non-verbal Blood pressure 136/66, pulse 79, temperature 98.4 F (36.9 C), temperature source Axillary, resp. rate (!) 21, height 6\' 3"  (1.905 m), weight 75.3 kg, SpO2 100 %. Physical Exam Elderly male - appears chronically ill Neck/ chest incisions c/d/i Drains - serosanguinous fluid Abd - soft, non-tender, no peritoneal signs LUQ gastrostomy tube - the stay sutures are intact but loose.  The skin flange is several centimeters away from the skin.  When I put gentle traction on the tube, I can feel the balloon engage what is probably the gastric lumen.  I tightened the skin flange down to the skin at about 2.5 cm, which is where it was at the time of surgery.  I injected saline into the tube and it flushes easily with no resistance.  The patient seems asymptomatic through the whole process.  Assessment/Plan: Gastrostomy tube in place - it appears that the flange just slid back and the tube was loose.  It is now repositioned and tightened.  I have instructed the nurse to instill 25 ml of Gastrografin into the tube, then we will obtain a plain film to confirm intraluminal placement.  If this is confirmed, you  may restart tube feeds.  Imogene Burn Stephanie Littman 01/13/2019, 2:09 PM

## 2019-01-13 NOTE — Plan of Care (Signed)

## 2019-01-13 NOTE — Progress Notes (Signed)
Dressing change at PEG site revealed peg tube displaced from abdomen 2 inches . Original securement device dislodged and broken leaving 2 small plastic cubes hanging by suture to fascia. Tube feeding stopped 09:45. MD notified, KUB ordered.

## 2019-01-14 ENCOUNTER — Inpatient Hospital Stay (HOSPITAL_COMMUNITY): Payer: Medicare Other

## 2019-01-14 DIAGNOSIS — C760 Malignant neoplasm of head, face and neck: Principal | ICD-10-CM

## 2019-01-14 DIAGNOSIS — D631 Anemia in chronic kidney disease: Secondary | ICD-10-CM

## 2019-01-14 DIAGNOSIS — F0390 Unspecified dementia without behavioral disturbance: Secondary | ICD-10-CM

## 2019-01-14 DIAGNOSIS — Z9359 Other cystostomy status: Secondary | ICD-10-CM

## 2019-01-14 DIAGNOSIS — N1831 Chronic kidney disease, stage 3a: Secondary | ICD-10-CM

## 2019-01-14 DIAGNOSIS — E440 Moderate protein-calorie malnutrition: Secondary | ICD-10-CM

## 2019-01-14 DIAGNOSIS — Z930 Tracheostomy status: Secondary | ICD-10-CM

## 2019-01-14 DIAGNOSIS — Z720 Tobacco use: Secondary | ICD-10-CM

## 2019-01-14 DIAGNOSIS — F32A Depression, unspecified: Secondary | ICD-10-CM | POA: Diagnosis present

## 2019-01-14 DIAGNOSIS — G2581 Restless legs syndrome: Secondary | ICD-10-CM

## 2019-01-14 DIAGNOSIS — F329 Major depressive disorder, single episode, unspecified: Secondary | ICD-10-CM

## 2019-01-14 DIAGNOSIS — J410 Simple chronic bronchitis: Secondary | ICD-10-CM

## 2019-01-14 LAB — CBC WITH DIFFERENTIAL/PLATELET
Abs Immature Granulocytes: 0.05 10*3/uL (ref 0.00–0.07)
Basophils Absolute: 0 10*3/uL (ref 0.0–0.1)
Basophils Relative: 0 %
Eosinophils Absolute: 0 10*3/uL (ref 0.0–0.5)
Eosinophils Relative: 0 %
HCT: 26.3 % — ABNORMAL LOW (ref 39.0–52.0)
Hemoglobin: 8 g/dL — ABNORMAL LOW (ref 13.0–17.0)
Immature Granulocytes: 0 %
Lymphocytes Relative: 4 %
Lymphs Abs: 0.5 10*3/uL — ABNORMAL LOW (ref 0.7–4.0)
MCH: 29.3 pg (ref 26.0–34.0)
MCHC: 30.4 g/dL (ref 30.0–36.0)
MCV: 96.3 fL (ref 80.0–100.0)
Monocytes Absolute: 1.1 10*3/uL — ABNORMAL HIGH (ref 0.1–1.0)
Monocytes Relative: 9 %
Neutro Abs: 10.5 10*3/uL — ABNORMAL HIGH (ref 1.7–7.7)
Neutrophils Relative %: 87 %
Platelets: 277 10*3/uL (ref 150–400)
RBC: 2.73 MIL/uL — ABNORMAL LOW (ref 4.22–5.81)
RDW: 14.8 % (ref 11.5–15.5)
WBC: 12.1 10*3/uL — ABNORMAL HIGH (ref 4.0–10.5)
nRBC: 0 % (ref 0.0–0.2)

## 2019-01-14 LAB — URINALYSIS, ROUTINE W REFLEX MICROSCOPIC
Bilirubin Urine: NEGATIVE
Glucose, UA: 50 mg/dL — AB
Ketones, ur: NEGATIVE mg/dL
Nitrite: POSITIVE — AB
Protein, ur: 30 mg/dL — AB
Specific Gravity, Urine: 1.012 (ref 1.005–1.030)
WBC, UA: >50 WBC/hpf — ABNORMAL HIGH (ref 0–5)
pH: 7 (ref 5.0–8.0)

## 2019-01-14 LAB — BASIC METABOLIC PANEL
Anion gap: 8 (ref 5–15)
BUN: 29 mg/dL — ABNORMAL HIGH (ref 8–23)
CO2: 20 mmol/L — ABNORMAL LOW (ref 22–32)
Calcium: 8 mg/dL — ABNORMAL LOW (ref 8.9–10.3)
Chloride: 109 mmol/L (ref 98–111)
Creatinine, Ser: 1.74 mg/dL — ABNORMAL HIGH (ref 0.61–1.24)
GFR calc Af Amer: 42 mL/min — ABNORMAL LOW (ref >60)
GFR calc non Af Amer: 36 mL/min — ABNORMAL LOW (ref >60)
Glucose, Bld: 151 mg/dL — ABNORMAL HIGH (ref 70–99)
Potassium: 4 mmol/L (ref 3.5–5.1)
Sodium: 137 mmol/L (ref 135–145)

## 2019-01-14 LAB — LACTIC ACID, PLASMA
Lactic Acid, Venous: 1.6 mmol/L (ref 0.5–1.9)
Lactic Acid, Venous: 1.2 mmol/L (ref 0.5–1.9)

## 2019-01-14 LAB — FERRITIN: Ferritin: 286 ng/mL (ref 24–336)

## 2019-01-14 LAB — IRON AND TIBC
Iron: 8 ug/dL — ABNORMAL LOW (ref 45–182)
Saturation Ratios: 4 % — ABNORMAL LOW (ref 17.9–39.5)
TIBC: 182 ug/dL — ABNORMAL LOW (ref 250–450)
UIBC: 174 ug/dL

## 2019-01-14 LAB — C-REACTIVE PROTEIN: CRP: 13.4 mg/dL — ABNORMAL HIGH (ref <1.0)

## 2019-01-14 LAB — PROCALCITONIN: Procalcitonin: 0.26 ng/mL

## 2019-01-14 MED ORDER — HYPROMELLOSE (GONIOSCOPIC) 2.5 % OP SOLN
1.0000 [drp] | OPHTHALMIC | Status: DC
  Filled 2019-01-14: qty 15

## 2019-01-14 MED ORDER — VANCOMYCIN HCL 10 G IV SOLR
1500.0000 mg | Freq: Once | INTRAVENOUS | Status: AC
  Administered 2019-01-14: 1500 mg via INTRAVENOUS
  Filled 2019-01-14: qty 1500

## 2019-01-14 MED ORDER — VANCOMYCIN HCL IN DEXTROSE 1-5 GM/200ML-% IV SOLN
1000.0000 mg | INTRAVENOUS | Status: DC
  Administered 2019-01-15: 1000 mg via INTRAVENOUS
  Filled 2019-01-14 (×2): qty 200

## 2019-01-14 MED ORDER — POLYVINYL ALCOHOL 1.4 % OP SOLN
1.0000 [drp] | OPHTHALMIC | Status: DC | PRN
  Administered 2019-01-19: 1 [drp] via OPHTHALMIC
  Filled 2019-01-14: qty 15

## 2019-01-14 MED ORDER — SODIUM CHLORIDE 0.9 % IV SOLN
2.0000 g | Freq: Two times a day (BID) | INTRAVENOUS | Status: DC
  Administered 2019-01-14 – 2019-01-18 (×10): 2 g via INTRAVENOUS
  Filled 2019-01-14 (×12): qty 2

## 2019-01-14 MED ORDER — ACETAMINOPHEN 650 MG RE SUPP
650.0000 mg | RECTAL | Status: DC | PRN
  Administered 2019-01-14 – 2019-01-16 (×3): 650 mg via RECTAL
  Filled 2019-01-14 (×3): qty 1

## 2019-01-14 MED ORDER — POLYETHYLENE GLYCOL 3350 17 G PO PACK: 17.0000 g | PACK | Freq: Every day | ORAL | Status: DC | PRN

## 2019-01-14 MED ORDER — ENOXAPARIN SODIUM 40 MG/0.4ML ~~LOC~~ SOLN
40.0000 mg | SUBCUTANEOUS | Status: DC
  Administered 2019-01-14 – 2019-01-17 (×3): 40 mg via SUBCUTANEOUS
  Filled 2019-01-14 (×3): qty 0.4

## 2019-01-14 NOTE — Progress Notes (Signed)
Pt had a desat with RN before RT arrived.  RT increased flow and FiO2 on ATC.  Pt now looking better.

## 2019-01-14 NOTE — Consult Note (Signed)
Medical Consultation   KRISTY NYGREN  [id number]  DOB: [birth year]  DOA: 01/11/2019  PCP: Pablo Ledger, MD   Requesting physician: Dr. Lind Guest  Reason for consultation: Fever, leukocytosis and anemia History of Present Illness: Anthony [name] is an 78 y.o. male squamous cell carcinoma of right neck s/p radical neck dissection, parotidectomy and cervical pectoral flap, CKD stage III, COPD, dementia, depression, chronic bladder outlet obstruction and urinary retention, BPH s/p suprapubic catheter, restless leg syndrome, has chronic trach, PEG tube admitted for radical neck dissection secondary to underlying squamous cell carcinoma of right neck.  Patient is POD #2.  Today patient developed high-grade fever of 103, with leukocytosis and his H&H dropped from 10.0-8.0 in 3 days.  Patient has chronic trach, PEG tube, suprapubic catheter and 2 surgical drains-no history of bleeding or greenish foul-smelling discharge.  No cough or congestion, wheezing, abdominal pain, foul-smelling/dark urine.  Had bowel movement x2-no melena.  Has good urine output.  Blood culture was obtained and is pending.  Chest x-ray: Shows no pneumonia.  Had x-ray KUB yesterday which came back negative for ileus.  Patient gets oxycodone every 6 hours for pain and Lovenox 40 mg subcutaneous once daily for DVT prophylaxis.  Patient denies headache, blurry vision, chest pain, shortness of breath, palpitation or leg swelling.   Past Medical History:  Diagnosis Date  . Acute pyelonephritis 12/23/2014  . Arthritis   . Asthma   . BPH (benign prostatic hyperplasia)   . Cancer ([place])    Lip cancer,has a place on the top of head  . Chronic back pain   . Chronic hip pain   . CKD (chronic kidney disease)    Stage 3  . COPD (chronic obstructive pulmonary disease) ([place])   . Dementia ([place])   . Depression   . Dysphagia   . GERD (gastroesophageal reflux disease)   . Headache   . History of kidney  stones   . Hypertension    no longer on medication  . Kidney stone   . Neuropathy   . Pneumonia    november 2019  bilateral  . PTSD (post-traumatic stress disorder)   . Pulmonary nodules 12/25/2014  . Restless leg syndrome          Review of Systems:  Review of Systems  Constitutional: Positive for diaphoresis.   As per HPI otherwise 10 point review of systems negative.     Past Medical History: Past Medical History:  Diagnosis Date  . Acute pyelonephritis 12/23/2014  . Arthritis   . Asthma   . BPH (benign prostatic hyperplasia)   . Cancer ([place])    Lip cancer,has a place on the top of head  . Chronic back pain   . Chronic hip pain   . CKD (chronic kidney disease)    Stage 3  . COPD (chronic obstructive pulmonary disease) ([place])   . Dementia ([place])   . Depression   . Dysphagia   . GERD (gastroesophageal reflux disease)   . Headache   . History of kidney stones   . Hypertension    no longer on medication  . Kidney stone   . Neuropathy   . Pneumonia    november 2019  bilateral  . PTSD (post-traumatic stress disorder)   . Pulmonary nodules 12/25/2014  . Restless leg syndrome     Past Surgical History: Past Surgical History:  Procedure Laterality Date  .  ABDOMINAL SURGERY     heria  . APPLICATION OF A-CELL OF HEAD/NECK N/A 09/18/2018   Procedure: Preperation of scalp wound for placement of A-Cell;  Surgeon: Wallace Going, DO;  Location: [facility];  Service: Plastics;  Laterality: N/A;  . CLEFT LIP REPAIR N/A 09/18/2018   Procedure: Reconstruction of upper and lower lip defect;  Surgeon: Wallace Going, DO;  Location: [facility];  Service: Plastics;  Laterality: N/A;  Total surgery time 2 hours  . ESOPHAGOGASTRODUODENOSCOPY N/A 07/06/2017   Procedure: ESOPHAGOGASTRODUODENOSCOPY (EGD);  Surgeon: Rogene Houston, MD;  [facility];  Service: Endoscopy;  Laterality: N/A;  . EYE SURGERY    . FRACTURE SURGERY  2008   hip fracture with nailing  .  HERNIA REPAIR    . INCISION AND DRAINAGE Bilateral 10/02/2018   Procedure: Incision And Drainage/ Cleaning of Lip Flap with Inset of lip Flap;  Surgeon: Wallace Going, DO;  Location: [facility];  Service: Plastics;  Laterality: Bilateral;  . IR CATHETER TUBE CHANGE  01/26/2018  . LAPAROSCOPIC GASTROSTOMY N/A 04/21/2018   Procedure: LAPAROSCOPIC GASTROSTOMY WITH G-TUBE PLACEMENT;  Surgeon: Kinsinger, Arta Bruce, MD;  Location: [facility];  Service: General;  Laterality: N/A;  . LAPAROSCOPIC GASTROSTOMY N/A 04/20/2018   Procedure: ATTEMPTED ENDOSCOPIC GASTROSTOMY;  Surgeon: Kieth Brightly Arta Bruce, MD;  Location: [facility];  Service: General;  Laterality: N/A;  . LESION EXCISION WITH COMPLEX REPAIR N/A 04/12/2018   Procedure: TOTAL LOWER LIP RESECTION;  Surgeon: Melida Quitter, MD;  Location: [facility];  Service: ENT;  Laterality: N/A;  . NASAL FLAP ROTATION N/A 04/20/2018   Procedure: REVISION LOWER LIP FLAP AND SCALP FLAP;  Surgeon: Wallace Going, DO;  Location: [facility];  Service: Plastics;  Laterality: N/A;  . ORAL EXCISION OF SCAR CONTRACTURE AND TISSUE ADVANCEMENT N/A 10/02/2018   Procedure: ORAL EXCISION OF SCAR CONTRACTURE AND TISSUE ADVANCEMENT;  Surgeon: Wallace Going, DO;  Location: [facility];  Service: Plastics;  Laterality: N/A;  . PAROTIDECTOMY Right 01/11/2019   Procedure: Parotidectomy;  Surgeon: Melida Quitter, MD;  Location: [facility];  Service: ENT;  Laterality: Right;  . RADICAL NECK DISSECTION Bilateral 04/12/2018   Procedure: Bilateral Radical Neck Dissection;  Surgeon: Melida Quitter, MD;  Location: [facility];  Service: ENT;  Laterality: Bilateral;  . RADICAL NECK DISSECTION Right 01/11/2019   Procedure: Radical Neck Dissection;  Surgeon: Melida Quitter, MD;  Location: [facility];  Service: ENT;  Laterality: Right;  . ROTATION FLAP Right 01/11/2019   Procedure: Cervicopectoral Rotation Flap;  Surgeon: Melida Quitter, MD;  Location: [facility];  Service: ENT;  Laterality: Right;  . SCALP LACERATION REPAIR N/A  04/20/2018   Procedure: WIDE SCALP EXCISION SKIN CANCER X 7 CM;  Surgeon: Melida Quitter, MD;  Location: [facility];  Service: ENT;  Laterality: N/A;  . Superubic Catheter    . supubic catheter    . TRACHEOSTOMY TUBE PLACEMENT N/A 04/12/2018   Procedure: TRACHEOSTOMY;  Surgeon: Melida Quitter, MD;  Location: [facility];  Service: ENT;  Laterality: N/A;     Allergies:   Allergies  Allergen Reactions  . Bee Venom Anaphylaxis  . Penicillins Hives    Has patient had a PCN reaction causing immediate rash, facial/tongue/throat swelling, SOB or lightheadedness with hypotension: NO Has patient had a PCN reaction causing severe rash involving mucus membranes or skin necrosis: NO Has patient had a PCN reaction that required hospitalization: NO Has patient had a PCN reaction occurring within the last 10 years: NO  If all of the above answers are "NO", then may proceed with Cephalosporin use.   . Strawberry Extract Rash     Social History:  reports that he quit smoking about 19 months ago. His smoking use included cigarettes. He smoked 0.50 packs per day. He has never used smokeless tobacco. He reports that he does not drink alcohol or use drugs.   Family History: Family History  Family history unknown: Yes      Physical Exam: Vitals:   01/14/19 0800 01/14/19 0900 01/14/19 1000 01/14/19 1203  BP:    100/61  Pulse: 87   79  Resp: 20   20  Temp: (!) 103.1 F (39.5 C) (!) 102.7 F (39.3 C) (!) 101.6 F (38.7 C) 99.3 F (37.4 C)  TempSrc: Axillary Axillary Axillary Axillary  SpO2: 95%   100%  Weight:      Height:        Constitutional: Appearance,  Alert and awake, oriented x3, not in any acute distress.  Has trach Eyes: PERLA, EOMI, irises appear normal, anicteric sclera,  ENMT: external ears and nose appear normal, normal hearing or hard of hearing            Lips appears normal, oropharynx mucosa, tongue, posterior pharynx appear normal  CVS: S1-S2 clear, no murmur rubs or gallops, no LE  edema, normal pedal pulses  Respiratory:  clear to auscultation bilaterally, no wheezing, rales or rhonchi. Respiratory effort normal. No accessory muscle use.  Abdomen: soft nontender, nondistended, normal bowel sounds, no hepatosplenomegaly, no hernias.  PEG tube site: No discharge seen.  Suprapubic catheter site: No discharge seen.  No guarding, no rigidity. Musculoskeletal: : no cyanosis, clubbing or edema noted bilaterally                       Joint/bones/muscle exam, strength, contractures or atrophy Neuro: Cranial nerves II-XII intact, strength, sensation, reflexes Psych: judgement and insight appear normal, stable mood and affect, mental status Skin: Surgical scar on right side of neck-noted.  No signs of bleeding or discharge seen.  No redness.  Nontender.  2 drains noted with bloody to serouneous  drainage.   Data reviewed:  I have personally reviewed following labs and imaging studies Labs:  CBC: Recent Labs  Lab 01/11/19 0740 01/13/19 0959 01/14/19 1137  WBC 8.2 11.0* 12.1*  NEUTROABS  --   --  10.5*  HGB 10.0* 8.1* 8.0*  HCT 32.7* 26.9* 26.3*  MCV 98.5 98.9 96.3  PLT 240 273 [id number]    Basic Metabolic Panel: Recent Labs  Lab 01/11/19 0740 01/13/19 0959 01/14/19 1137  NA 137  --  137  K 3.8  --  4.0  CL 105  --  109  CO2 21*  --  20*  GLUCOSE 116*  --  151*  BUN 34*  --  29*  CREATININE 2.03* 1.80* 1.74*  CALCIUM 9.0  --  8.0*   GFR Estimated Creatinine Clearance: 36.1 mL/min (A) (by C-G formula based on SCr of 1.74 mg/dL (H)). Liver Function Tests: No results for input(s): AST, ALT, ALKPHOS, BILITOT, PROT, ALBUMIN in the last 168 hours. No results for input(s): LIPASE, AMYLASE in the last 168 hours. No results for input(s): AMMONIA in the last 168 hours. Coagulation profile No results for input(s): INR, PROTIME in the last 168 hours.  Cardiac Enzymes: No results for input(s): CKTOTAL, CKMB, CKMBINDEX, TROPONINI in the last 168 hours. BNP: Invalid  input(s): POCBNP CBG: Recent Labs  Lab  01/11/19 2058  GLUCAP 149*   D-Dimer No results for input(s): DDIMER in the last 72 hours. Hgb A1c No results for input(s): HGBA1C in the last 72 hours. Lipid Profile No results for input(s): CHOL, HDL, LDLCALC, TRIG, CHOLHDL, LDLDIRECT in the last 72 hours. Thyroid function studies No results for input(s): TSH, T4TOTAL, T3FREE, THYROIDAB in the last 72 hours.  Invalid input(s): FREET3 Anemia work up No results for input(s): VITAMINB12, FOLATE, FERRITIN, TIBC, IRON, RETICCTPCT in the last 72 hours. Urinalysis    Component Value Date/Time   COLORURINE YELLOW 01/14/2019 1406   APPEARANCEUR HAZY (A) 01/14/2019 1406   LABSPEC 1.012 01/14/2019 1406   PHURINE 7.0 01/14/2019 1406   GLUCOSEU 50 (A) 01/14/2019 1406   HGBUR SMALL (A) 01/14/2019 1406   BILIRUBINUR NEGATIVE 01/14/2019 1406   KETONESUR NEGATIVE 01/14/2019 1406   PROTEINUR 30 (A) 01/14/2019 1406   UROBILINOGEN 1.0 12/23/2014 1853   NITRITE POSITIVE (A) 01/14/2019 1406   LEUKOCYTESUR LARGE (A) 01/14/2019 1406     Microbiology Recent Results (from the past 240 hour(s))  SARS CORONAVIRUS 2 (TAT 6-24 HRS) Nasopharyngeal Nasopharyngeal Swab     Status: None   Collection Time: 01/11/19  5:30 PM   Specimen: Nasopharyngeal Swab  Result Value Ref Range Status   SARS Coronavirus 2 NEGATIVE NEGATIVE Final    Comment: (NOTE) SARS-CoV-2 target nucleic acids are NOT DETECTED. The SARS-CoV-2 RNA is generally detectable in upper and lower respiratory specimens during the acute phase of infection. Negative results do not preclude SARS-CoV-2 infection, do not rule out co-infections with other pathogens, and should not be used as the sole basis for treatment or other patient management decisions. Negative results must be combined with clinical observations, patient history, and epidemiological information. The expected result is Negative. Fact Sheet for Patients:  SugarRoll.be Fact Sheet for Healthcare Providers: https://www.woods-mathews.com/ This test is not yet approved or cleared by the [place] FDA and  has been authorized for detection and/or diagnosis of SARS-CoV-2 by FDA under an Emergency Use Authorization (EUA). This EUA will remain  in effect (meaning this test can be used) for the duration of the COVID-19 declaration under Section 56 4(b)(1) of the Act, 21 U.S.C. section 360bbb-3(b)(1), unless the authorization is terminated or revoked sooner. Performed at [facility], [place] [address], [place], [place] [address]   MRSA PCR Screening     Status: Abnormal   Collection Time: 01/12/19 12:54 AM   Specimen: Nasopharyngeal  Result Value Ref Range Status   MRSA by PCR POSITIVE (A) NEGATIVE Final    Comment:        The GeneXpert MRSA Assay (FDA approved for NASAL specimens only), is one component of a comprehensive MRSA colonization surveillance program. It is not intended to diagnose MRSA infection nor to guide or monitor treatment for MRSA infections. RESULT CALLED TO, READ BACK BY AND VERIFIED WITH: NEWCOMER,S RN 01/12/2019 AT TH:[id number] SKEEN,P Performed at [facility], [place] [address], [place], [place] [address]        Inpatient Medications:   Scheduled Meds: . artificial tears   Right Eye QHS  . bacitracin  1 application Topical [id number]  . chlorhexidine  15 mL Mouth Rinse BID  . Chlorhexidine Gluconate Cloth  6 each Topical Q0600  . enoxaparin (LOVENOX) injection  40 mg Subcutaneous Q24H  . feeding supplement  1 Container Oral TID BM  . feeding supplement (PRO-STAT SUGAR FREE 64)  30 mL Per Tube BID  . free water  200 mL Per Tube Q8H  . gabapentin  100 mg Oral TID  . mouth rinse  15 mL Mouth Rinse q12n4p  . Melatonin  6 mg Oral QHS  . memantine  5 mg Oral Daily  . multivitamin with minerals  1 tablet Oral Daily  . mupirocin ointment  1 application Nasal BID  .  pantoprazole  40 mg Oral Daily  . pramipexole  0.125 mg Oral QHS  . sertraline  50 mg Oral Daily   Continuous Infusions: . ceFEPime (MAXIPIME) IV    . dextrose 5 % and 0.45 % NaCl with KCl 20 mEq/L 75 mL/hr at 01/14/19 1200  . feeding supplement (OSMOLITE 1.5 CAL) 50 mL/hr at 01/14/19 1200  . vancomycin    . [START ON 01/15/2019] vancomycin       Radiological Exams on Admission: Dg Chest Port 1 View  Result Date: 01/14/2019 CLINICAL DATA:  Poor mobility.  Asthma. EXAM: PORTABLE CHEST 1 VIEW COMPARISON:  November 10, 2018 FINDINGS: A tracheostomy tube terminates over the tracheal air column. No pneumothorax. Skin staples project over the right side of the chest and neck. An oval density over the right chest is thought to be something on the patient. The cardiomediastinal silhouette is normal. The lungs are clear. IMPRESSION: There appears to be an object on the right side of the chest. Recommend clinical correlation. Post surgical staples are noted over the right. No pneumothorax. The lungs are clear. Electronically Signed   By: Dorise Bullion III M.D   On: 01/14/2019 11:36   Dg Abd Portable 1v  Result Date: 01/13/2019 CLINICAL DATA:  Encounter for gastrostomy tube positioning. EXAM: PORTABLE ABDOMEN - 1 VIEW COMPARISON:  01/13/2019 FINDINGS: Contrast was injected through the gastrostomy tube. There is contrast within the stomach and proximal small bowel. Curvature in the thoracolumbar spine could be related to patient positioning and underlying scoliosis. Evidence for surgical mesh in the lower abdomen and pelvic region. IMPRESSION: Contrast in the stomach and proximal small bowel. Findings confirm gastrostomy tube placement in the stomach. Electronically Signed   By: Markus Daft M.D.   On: 01/13/2019 14:40   Dg Abd Portable 1v  Result Date: 01/13/2019 CLINICAL DATA:  S/P percutaneous endoscopic gastrostomy (PEG) tube placement. Ordering physician specified they did not want contrast used for  this study. Hx of HTN, CKD. EXAM: PORTABLE ABDOMEN - 1 VIEW COMPARISON:  09/28/2018 FINDINGS: Gastrostomy tube projects over the central abdomen. Normal bowel gas pattern. Hernia mesh noted superimposed over the right lower abdomen. IMPRESSION: 1. No acute findings. 2. Presumed gastrostomy tube projects over the mid abdomen. Electronically Signed   By: Lajean Manes M.D.   On: 01/13/2019 10:51    Impression/Recommendations Active Problems:   Anemia   Tobacco abuse   COPD (chronic obstructive pulmonary disease) (HCC)   CKD (chronic kidney disease) stage 3, GFR 30-59 ml/min   Dementia (HCC)   Tracheostomy in place ([place])   Head and neck cancer (HCC)   Malnutrition of moderate degree   Depression   Suprapubic catheter ([facility])   Restless leg syndrome  Postoperative fever: -History of squamous cell carcinoma of right neck s/p radical neck dissection, parotidectomy and cervical pectoral flap, -Patient is POD #2 -Patient has tracheostomy and PEG tube. -Chest x-ray is negative for pneumonia.  X-ray KUB negative for ileus. -We will obtain blood culture, UA and urine culture.  Tylenol as needed for fever more than 100.4.  Lactic acid: WNL.  Procalcitonin level: 0.26. -Started on broad-spectrum antibiotics  IV vancomycin and cefepime. -Monitor vitals and H&H closely. -We will consult physical therapy.  Patient unable to perform incentive spirometry. -No signs of leg swelling, erythema or tenderness-continue Lovenox 40 mg subcutaneous daily for DVT prophylaxis.  CKD: Stable -Improving. Continue IV fluids. -  Avoid nephrotoxic medication. -Monitor BMP  Anemia: -Likely secondary to postoperative blood loss anemia? -H&H dropped from 10.0-8.0 in 3 days -We will check iron studies and stool occult. -Monitor H&H closely and transfuse as needed.  Dementia: Stable continue Namenda  Restless leg syndrome: Stable -Continue pramipexole  Depression: Stable continue Zoloft.  Chronic bladder outlet  obstruction: Status post suprapubic catheter -We will check UA/UC -As per nurse: No foul-smelling/dark urine   Thank you for this consultation.  Our [facility] hospitalist team will follow the patient with you.   Time Spent: 30 minutes  Mckinley Jewel M.D. [facility] 01/14/2019, 2:49 PM

## 2019-01-14 NOTE — Progress Notes (Signed)
Subjective:    Patient ID: Anthony [name], male    DOB: 1941/01/29, 78 y.o.   MRN: [id number]  Pain seems to be controlled.   Febrile to 103 this morning, did reduce with a dose of Tylenol.  G-tube partially dislodged yesterday and general surgery kind of the reexamine the G-tube and repositioned.  Confirming KUB shows that the G-tube is in good position and has been in use since that time.  No significant cough.  No signs of wound infection.  No complaints.  Sleeping but awakens easily.  Review of Systems  Constitutional: Negative.   HENT: Negative.   Respiratory: Negative.   Neurological: Negative.   Psychiatric/Behavioral: Negative.        Objective:   Physical Exam Constitutional:      General: He is not in acute distress. HENT:     Head: Normocephalic and atraumatic.     Nose: Nose normal.     Mouth/Throat:     Mouth: Mucous membranes are moist.     Pharynx: Oropharynx is clear.  Eyes:     Extraocular Movements: Extraocular movements intact.     Conjunctiva/sclera: Conjunctivae normal.     Pupils: Pupils are equal, round, and reactive to light.  Neck:     Comments: Right neck incision is clean/dry/intact.  Neck JP drain is with serosanguineous output.  Chest wall is clean/dry/intact and JP drain with serosanguineous output. Cardiovascular:     Pulses: Normal pulses.  Pulmonary:     Effort: Pulmonary effort is normal.     Breath sounds: Normal breath sounds.  Abdominal:     General: Abdomen is flat. There is no distension.     Palpations: Abdomen is soft.     Tenderness: There is no abdominal tenderness.     Comments: G-tube site looks healthy.  Musculoskeletal:        General: No swelling, tenderness or deformity.     Right lower leg: No edema.     Left lower leg: No edema.  Skin:    General: Skin is warm and dry.     Capillary Refill: Capillary refill takes less than 2 seconds.     Findings: No bruising or erythema.  Neurological:     General: No focal  deficit present.     Mental Status: He is alert and oriented to person, place, and time.  Psychiatric:        Mood and Affect: Mood normal.        Behavior: Behavior normal.    Right CN VII weak on right in all divisons   White blood cell count is rising is now 12. Hemoglobin has decreased to 8 from 10 at admission. Chest x-ray appears clear.  KUB yesterday with Gastrografin demonstrates G-tube in good position. Procalcitonin trending. Creatinine stable.    Assessment & Plan:  Patient is status post right radical neck dissection, parotidectomy, and cervicopectoral flap, POD #3  The flap looks good.  Drains are functioning.  There is a broader right CN VII weakness likely related to cautery near the stylomastoid foramen during tumor removal.  Lower division paralysis is expected.  Hopefully, the upper division will recover with time.  Will initiate right eye tears and lube.  Observe in progressive unit. -Work on getting out of bed G-tube feeds to baseline, appreciate dietitian recommendations for feeding. He may have some clear liquids as tolerated Continue to trend procalcitonin Will have hospitalist get involved like with transfer of care due to complicated medical management  including worsening anemia, baseline elevated creatinine, and fever and leukocytosis that is developing. Continue prophylactic Lovenox.    Helayne Seminole, MD

## 2019-01-14 NOTE — Progress Notes (Signed)
Pharmacy Antibiotic Note  Anthony [name] is a 78 y.o. male admitted on 01/11/2019 with known lower lip and scalp cancers, s/p OR for surgical management of R upper neck mass.  Pharmacy has been consulted for vancomycin and cefepime dosing for postoperative fever.  Plan: Vancomycin 1500 mg IV x1, then 1000 mg IV Q 24 hrs. Goal AUC 400-550., Expected AUC: 537 SCr used: 1.74 Cefepime 2 g IV q12h Monitor cultures, clinical status, length of therapy, and vancomycin levels as needed  Height: 6\' 3"  (190.5 cm) Weight: 166 lb (75.3 kg) IBW/kg (Calculated) : 84.5  Temp (24hrs), Avg:100.4 F (38 C), Min:98.2 F (36.8 C), Max:103.1 F (39.5 C)  Recent Labs  Lab 01/11/19 0740 01/13/19 0959 01/14/19 1137  WBC 8.2 11.0* 12.1*  CREATININE 2.03* 1.80* 1.74*  LATICACIDVEN  --   --  1.6    Estimated Creatinine Clearance: 36.1 mL/min (A) (by C-G formula based on SCr of 1.74 mg/dL (H)).    Allergies  Allergen Reactions  . Bee Venom Anaphylaxis  . Penicillins Hives    Has patient had a PCN reaction causing immediate rash, facial/tongue/throat swelling, SOB or lightheadedness with hypotension: NO Has patient had a PCN reaction causing severe rash involving mucus membranes or skin necrosis: NO Has patient had a PCN reaction that required hospitalization: NO Has patient had a PCN reaction occurring within the last 10 years: NO If all of the above answers are "NO", then may proceed with Cephalosporin use.   [name] Extract Rash    Antimicrobials this admission: Periop clindamycin 10/29>>10/30 Vanc 11/1>> Cefepime 11/1>>  Dose adjustments this admission: None  Microbiology results: 11/1 UCx:  11/1 BCx: 10/30 MRSA: neg  Thank you for allowing pharmacy to be a part of this patient's care.  Vertis Kelch, PharmD PGY2 Cardiology Pharmacy Resident Phone [phone] 01/14/2019       2:14 PM  Please check [email or web address] for unit-specific pharmacist phone numbers

## 2019-01-14 NOTE — Progress Notes (Signed)
RT suctuioned pt's mouth with tracheal suction catheter and removed large amounts of very thick, tenacious sputum from back of the mouth.  Pt tolerated fairly well.  RT will monitor.

## 2019-01-14 NOTE — Progress Notes (Signed)
Trach care done patient tolerated it well no distress or complications noted

## 2019-01-15 LAB — URINALYSIS, ROUTINE W REFLEX MICROSCOPIC
Bilirubin Urine: NEGATIVE
Glucose, UA: 50 mg/dL — AB
Ketones, ur: NEGATIVE mg/dL
Nitrite: NEGATIVE
Protein, ur: 30 mg/dL — AB
Specific Gravity, Urine: 1.012 (ref 1.005–1.030)
pH: 7 (ref 5.0–8.0)

## 2019-01-15 LAB — BASIC METABOLIC PANEL
Anion gap: 6 (ref 5–15)
BUN: 29 mg/dL — ABNORMAL HIGH (ref 8–23)
CO2: 20 mmol/L — ABNORMAL LOW (ref 22–32)
Calcium: 7.6 mg/dL — ABNORMAL LOW (ref 8.9–10.3)
Chloride: 110 mmol/L (ref 98–111)
Creatinine, Ser: 1.66 mg/dL — ABNORMAL HIGH (ref 0.61–1.24)
GFR calc Af Amer: 44 mL/min — ABNORMAL LOW (ref >60)
GFR calc non Af Amer: 38 mL/min — ABNORMAL LOW (ref >60)
Glucose, Bld: 176 mg/dL — ABNORMAL HIGH (ref 70–99)
Potassium: 4.4 mmol/L (ref 3.5–5.1)
Sodium: 136 mmol/L (ref 135–145)

## 2019-01-15 LAB — CBC WITH DIFFERENTIAL/PLATELET
Abs Immature Granulocytes: 0.09 10*3/uL — ABNORMAL HIGH (ref 0.00–0.07)
Basophils Absolute: 0 10*3/uL (ref 0.0–0.1)
Basophils Relative: 1 %
Eosinophils Absolute: 0.2 10*3/uL (ref 0.0–0.5)
Eosinophils Relative: 3 %
HCT: 24.1 % — ABNORMAL LOW (ref 39.0–52.0)
Hemoglobin: 7.5 g/dL — ABNORMAL LOW (ref 13.0–17.0)
Immature Granulocytes: 1 %
Lymphocytes Relative: 10 %
Lymphs Abs: 0.6 10*3/uL — ABNORMAL LOW (ref 0.7–4.0)
MCH: 29.6 pg (ref 26.0–34.0)
MCHC: 31.1 g/dL (ref 30.0–36.0)
MCV: 95.3 fL (ref 80.0–100.0)
Monocytes Absolute: 0.8 10*3/uL (ref 0.1–1.0)
Monocytes Relative: 12 %
Neutro Abs: 4.8 10*3/uL (ref 1.7–7.7)
Neutrophils Relative %: 73 %
Platelets: 228 10*3/uL (ref 150–400)
RBC: 2.53 MIL/uL — ABNORMAL LOW (ref 4.22–5.81)
RDW: 14.6 % (ref 11.5–15.5)
WBC: 6.5 10*3/uL (ref 4.0–10.5)
nRBC: 0 % (ref 0.0–0.2)

## 2019-01-15 LAB — PROCALCITONIN: Procalcitonin: 0.28 ng/mL

## 2019-01-15 LAB — URINE CULTURE

## 2019-01-15 NOTE — Evaluation (Signed)
Physical Therapy Evaluation Patient Details Name: Anthony [name] MRN: [id number] DOB: 1941-01-26 Today's Date: 01/15/2019   History of Present Illness  Anthony [name] is an 78 y.o. male with squamous cell carcinoma of right neck s/p radical neck dissection, parotidectomy and cervical pectoral flap. PMH includes CKD stage III, COPD, dementia, depression, chronic bladder outlet obstruction and urinary retention, BPH s/p suprapubic catheter, restless leg syndrome, chronic trach, PEG tube admitted for radical neck dissection secondary to underlying squamous cell carcinoma of right neck.  Clinical Impression  Patient presents with pain, generalized weakness, impaired balance, cognitive deficits, decreased activity tolerance and impaired mobility s/p above. Pt difficult historian- known hx of dementia. Pt reports being ambulatory at his facility and doing his own ADLs PTA. Today, pt tolerated rolling and sitting EOB with Mod-Max A, mostly limited by pain and dizziness. BP soft but stable. Follows simple 1 step commands with increased time and repetition. Would benefit from return to SNF to maximize independence and mobility prior to return home. Will follow acutely.    Follow Up Recommendations SNF;Supervision for mobility/OOB    Equipment Recommendations  None recommended by PT    Recommendations for Other Services       Precautions / Restrictions Precautions Precautions: Fall Precaution Comments: trach collar 28% 5L 02, JP drain, PEG tube Restrictions Weight Bearing Restrictions: No      Mobility  Bed Mobility Overal bed mobility: Needs Assistance Bed Mobility: Rolling;Sidelying to Sit;Sit to Sidelying Rolling: Min assist Sidelying to sit: Mod assist;HOB elevated;+2 for physical assistance     Sit to sidelying: Max assist;+2 for physical assistance;HOB elevated General bed mobility comments: Rolling to right/left with Min A and constant cues for pericare and to change bed linens; assist  to elevate trunk to get to EOB, increased pain. Assist to bring LEs into bed and lower trunk to return to supine.  Transfers                 General transfer comment: Deferred due to dizziness and pain.  Ambulation/Gait                Stairs            Wheelchair Mobility    Modified Rankin (Stroke Patients Only)       Balance Overall balance assessment: Needs assistance Sitting-balance support: Feet supported;No upper extremity supported Sitting balance-Leahy Scale: Fair Sitting balance - Comments: Able to sit statically with Min A-Min guard assist, dizziness.                                     Pertinent Vitals/Pain Pain Assessment: Faces Faces Pain Scale: Hurts whole lot Pain Location: right side of neck with mobility Pain Descriptors / Indicators: Sore;Grimacing;Guarding;Moaning;Operative site guarding;Discomfort Pain Intervention(s): Repositioned;Monitored during session;Limited activity within patient's tolerance    Home Living Family/patient expects to be discharged to:: Skilled nursing facility                 Additional Comments: in [place], at SNF for approx 2 months getting rehab     Prior Function Level of Independence: Needs assistance   Gait / Transfers Assistance Needed: ambulates with no AD   ADL's / Homemaking Assistance Needed: independent basic ADLs         Hand Dominance   Dominant Hand: Right    Extremity/Trunk Assessment   Upper Extremity Assessment Upper Extremity Assessment: Defer to OT  evaluation    Lower Extremity Assessment Lower Extremity Assessment: Generalized weakness    Cervical / Trunk Assessment Cervical / Trunk Assessment: Other exceptions Cervical / Trunk Exceptions: s/p neck surgery  Communication   Communication: Tracheostomy  Cognition Arousal/Alertness: Lethargic Behavior During Therapy: Flat affect Overall Cognitive Status: Impaired/Different from baseline Area of  Impairment: Orientation;Following commands;Awareness;Problem solving                 Orientation Level: Disoriented to;Situation;Time     Following Commands: Follows one step commands with increased time   Awareness: Intellectual Problem Solving: Requires verbal cues;Difficulty sequencing;Decreased initiation;Slow processing General Comments: oriented to person, place only - able to state hospital with options as well as year with options. Slow to respond to questions, difficult to udnerstand due to trach.      General Comments General comments (skin integrity, edema, etc.): Incision along neck/chest- clean dry with staples intact    Exercises     Assessment/Plan    PT Assessment Patient needs continued PT services  PT Problem List Decreased strength;Decreased mobility;Pain;Decreased balance;Decreased activity tolerance;Decreased cognition;Cardiopulmonary status limiting activity;Decreased skin integrity       PT Treatment Interventions Therapeutic activities;Gait training;Therapeutic exercise;Patient/family education;DME instruction;Balance training;Functional mobility training    PT Goals (Current goals can be found in the Care Plan section)  Acute Rehab PT Goals Patient Stated Goal: "to lay down" PT Goal Formulation: With patient Time For Goal Achievement: 01/29/19 Potential to Achieve Goals: Fair    Frequency Min 2X/week   Barriers to discharge        Co-evaluation PT/OT/SLP Co-Evaluation/Treatment: Yes Reason for Co-Treatment: For patient/therapist safety;To address functional/ADL transfers;Complexity of the patient's impairments (multi-system involvement);Necessary to address cognition/behavior during functional activity PT goals addressed during session: Mobility/safety with mobility         AM-PAC PT "6 Clicks" Mobility  Outcome Measure Help needed turning from your back to your side while in a flat bed without using bedrails?: A Little Help needed  moving from lying on your back to sitting on the side of a flat bed without using bedrails?: A Lot Help needed moving to and from a bed to a chair (including a wheelchair)?: A Lot Help needed standing up from a chair using your arms (e.g., wheelchair or bedside chair)?: A Lot Help needed to walk in hospital room?: A Lot Help needed climbing 3-5 steps with a railing? : Total 6 Click Score: 12    End of Session Equipment Utilized During Treatment: Oxygen(28% Fi02 5L TC) Activity Tolerance: Patient limited by pain;Treatment limited secondary to medical complications (Comment)(dizziness) Patient left: in bed;with call bell/phone within reach;with bed alarm set Nurse Communication: Mobility status PT Visit Diagnosis: Pain;Muscle weakness (generalized) (M62.81);Dizziness and giddiness (R42) Pain - Right/Left: Right Pain - part of body: (neck)    Time: [id number] PT Time Calculation (min) (ACUTE ONLY): 32 min   Charges:   PT Evaluation $PT Eval Moderate Complexity: 1 Mod          Marisa Severin, PT, DPT Acute Rehabilitation Services Pager [phone] Office [phone]      Marguarite Arbour A Sabra Heck 01/15/2019, 12:51 PM

## 2019-01-15 NOTE — Progress Notes (Signed)
PROGRESS NOTE    AMMIEL TALFORD  [id number] DOB: 1940-10-31 DOA: 01/11/2019 PCP: Pablo Ledger, MD    Brief Narrative:  KAIYA MCGREGOR is an 78 y.o. 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  Patient developed fever 103 with leukocytosis on day 2 surgery.  Medicine consulted.   Assessment & Plan:   Active Problems:   Anemia   Tobacco abuse   COPD (chronic obstructive pulmonary disease) (HCC)   CKD (chronic kidney disease) stage 3, GFR 30-59 ml/min   Dementia (HCC)   Tracheostomy in place (HCC)   Head and neck cancer (HCC)   Malnutrition of moderate degree   Depression   Suprapubic catheter (HCC)   Restless leg syndrome  Postoperative fever: Likely UTI due to indwelling suprapubic catheter.  Patient does have tenderness and pus draining around the suprapubic tube. Blood cultures drawn.  Urine cultures with multiple organisms.  Patient currently on vancomycin and cefepime.  MRSA nasal swab positive.  Blood cultures negative so far. Exchange suprapubic catheter, reculture urine after new catheter placement. Continue vancomycin and cefepime today as he had positive MRSA swab and postop wound.  Will de-escalate in the next 24 hours. Continue to work with therapies and mobility.  Chronic kidney disease stage III: On maintenance IV fluids.  Stable around at baseline.  Acute on chronic anemia of chronic disease: Hemoglobin 7.5.  No evidence of active bleeding.  Probably chronic anemia and aggravated by acute surgical procedures.  Monitor closely.  Currently no indication for transfusion.  Dementia, head and neck  cancer, deconditioning and impaired mobility: Has tracheostomy and functioning well.  Continue aggressive chest physiotherapy.  Chest x-ray was normal. PEG tube infusing well.  Patient also allowed clear liquid diet. Other chronic medical issues including dementia, restless leg syndrome remained stable.  Thank you for involving [place] in this patient's care.  We will continue to follow throughout the hospitalization.  DVT prophylaxis: Lovenox subcu Code Status: Full code Family Communication: None Disposition Plan: Anticipate discharge back to nursing home when is stable from surgical standpoint and afebrile.  Procedures:   Radical neck dissection and flap.  Antimicrobials:   Vancomycin, cefepime, 01/14/2019----   Subjective: Patient seen and examined.  Blood pressures are fairly stable.  Remains on maintenance fluid.  Temperature 103.1.  Patient himself has some cough, denies any other complaints.  He was able to communicate with weak voice.  Objective: Vitals:   01/15/19 0333 01/15/19 0734 01/15/19 0825 01/15/19 1123  BP: (!) 85/43  (!) 106/58   Pulse:  76 72 84  Resp:  15 20 (!) 25  Temp:   98.8 F (37.1 C)   TempSrc:   Axillary   SpO2:  100% 100% 100%  Weight:      Height:        Intake/Output Summary (Last 24 hours) at 01/15/2019 1130 Last data filed at 01/15/2019 [id number] Gross per 24 hour  Intake 2249.95 ml  Output 700 ml  Net 1549.95 ml   Filed Weights   01/11/19 0717  Weight: 75.3 kg    Examination:  General exam: Appears calm and comfortable, chronically sick looking. Respiratory system: Clear to auscultation. Respiratory effort normal.  Mostly conducted  airway sounds, mucoid secretions on the tracheostomy.  On trach collar. Cardiovascular system: S1 & S2 heard, RRR. No JVD, murmurs, rubs, gallops or clicks. No pedal edema. Gastrointestinal system: Abdomen is nondistended, soft.  PEG tube intact and feeding infusing.  No organomegaly or masses felt. Normal bowel  sounds heard. Patient has tenderness along the suprapubic area mostly on the left side, suprapubic catheter intact with thin purulent material expressing from the wound. Central nervous system: Alert and oriented. No focal neurological deficits.  Able to move all extremities. Extremities: Symmetric 5 x 5 power. Skin: No rashes, lesions or ulcers Psychiatry: Judgement and insight appear normal. Mood & affect appropriate.     Data Reviewed: I have personally reviewed following labs and imaging studies  CBC: Recent Labs  Lab 01/11/19 0740 01/13/19 0959 01/14/19 1137 01/15/19 0233  WBC 8.2 11.0* 12.1* 6.5  NEUTROABS  --   --  10.5* 4.8  HGB 10.0* 8.1* 8.0* 7.5*  HCT 32.7* 26.9* 26.3* 24.1*  MCV 98.5 98.9 96.3 95.3  PLT 240 273 277 [id number]   Basic Metabolic Panel: Recent Labs  Lab 01/11/19 0740 01/13/19 0959 01/14/19 1137 01/15/19 0233  NA 137  --  137 136  K 3.8  --  4.0 4.4  CL 105  --  109 110  CO2 21*  --  20* 20*  GLUCOSE 116*  --  151* 176*  BUN 34*  --  29* 29*  CREATININE 2.03* 1.80* 1.74* 1.66*  CALCIUM 9.0  --  8.0* 7.6*   GFR: Estimated Creatinine Clearance: 37.8 mL/min (A) (by C-G formula based on SCr of 1.66 mg/dL (H)). Liver Function Tests: No results for input(s): AST, ALT, ALKPHOS, BILITOT, PROT, ALBUMIN in the last 168 hours. No results for input(s): LIPASE, AMYLASE in the last 168 hours. No results for input(s): AMMONIA in the last 168 hours. Coagulation Profile: No results for input(s): INR, PROTIME in the last 168 hours. Cardiac Enzymes: No results for input(s): CKTOTAL, CKMB, CKMBINDEX, TROPONINI in the last 168 hours. BNP (last 3 results) No results for input(s): PROBNP in the last 8760 hours. HbA1C: No results for input(s): HGBA1C in the last 72 hours. CBG: Recent Labs  Lab 01/11/19 2058  GLUCAP 149*   Lipid Profile: No results for input(s): CHOL, HDL, LDLCALC, TRIG, CHOLHDL, LDLDIRECT in the last 72 hours. Thyroid Function Tests: No  results for input(s): TSH, T4TOTAL, FREET4, T3FREE, THYROIDAB in the last 72 hours. Anemia Panel: Recent Labs    01/14/19 1514  FERRITIN 286  TIBC 182*  IRON 8*   Sepsis Labs: Recent Labs  Lab 01/14/19 1137 01/14/19 1514 01/15/19 0233  PROCALCITON 0.26  --  0.28  LATICACIDVEN 1.6 1.2  --     Recent Results (from the past 240 hour(s))  SARS CORONAVIRUS 2 (TAT 6-24 HRS) Nasopharyngeal Nasopharyngeal Swab     Status: None   Collection Time: 01/11/19  5:30 PM   Specimen: Nasopharyngeal Swab  Result Value Ref Range Status   SARS Coronavirus 2 NEGATIVE NEGATIVE Final    Comment: (NOTE) SARS-CoV-2 target nucleic acids are NOT DETECTED. The SARS-CoV-2 RNA is generally detectable in upper and lower respiratory specimens during the acute phase of infection. Negative results do not preclude SARS-CoV-2 infection, do not rule out co-infections with other pathogens, and should not be used as the sole basis for treatment or other patient management decisions. Negative results must be combined with clinical observations, patient history, and epidemiological information. The expected result is Negative. Fact Sheet for  Patients: SugarRoll.be Fact Sheet for Healthcare Providers: https://www.woods-mathews.com/ This test is not yet approved or cleared by the [place] FDA and  has been authorized for detection and/or diagnosis of SARS-CoV-2 by FDA under an Emergency Use Authorization (EUA). This EUA will remain  in effect (meaning this test can be used) for the duration of the COVID-19 declaration under Section 56 4(b)(1) of the Act, 21 U.S.C. section 360bbb-3(b)(1), unless the authorization is terminated or revoked sooner. Performed at [facility], Home [address] [place], [place] [address]   MRSA PCR Screening     Status: Abnormal   Collection Time: 01/12/19 12:54 AM   Specimen: Nasopharyngeal  Result Value Ref Range Status   MRSA  by PCR POSITIVE (A) NEGATIVE Final    Comment:        The GeneXpert MRSA Assay (FDA approved for NASAL specimens only), is one component of a comprehensive MRSA colonization surveillance program. It is not intended to diagnose MRSA infection nor to guide or monitor treatment for MRSA infections. RESULT CALLED TO, READ BACK BY AND VERIFIED WITH: NEWCOMER,S RN 01/12/2019 AT [id number] SKEEN,P Performed at [facility], [place] [address], [place], [place] [address]   Culture, Urine     Status: Abnormal   Collection Time: 01/14/19  2:00 PM   Specimen: Urine, Catheterized  Result Value Ref Range Status   Specimen Description URINE, CATHETERIZED  Final   Special Requests   Final    NONE Performed at [facility], [place] [address] [place], [place] [address]    Culture MULTIPLE SPECIES PRESENT, SUGGEST RECOLLECTION (A)  Final   Report Status 01/15/2019 FINAL  Final  Culture, blood (routine x 2)     Status: None (Preliminary result)   Collection Time: 01/14/19  3:44 PM   Specimen: BLOOD RIGHT HAND  Result Value Ref Range Status   Specimen Description BLOOD RIGHT HAND  Final   Special Requests   Final    BOTTLES DRAWN AEROBIC ONLY Blood Culture adequate volume   Culture   Final    NO GROWTH < 24 HOURS Performed at [facility], [place] [address] [place], [place] [address]    Report Status PENDING  Incomplete  Culture, blood (routine x 2)     Status: None (Preliminary result)   Collection Time: 01/14/19  3:44 PM   Specimen: BLOOD RIGHT ARM  Result Value Ref Range Status   Specimen Description BLOOD RIGHT ARM  Final   Special Requests   Final    BOTTLES DRAWN AEROBIC AND ANAEROBIC Blood Culture results may not be optimal due to an excessive volume of blood received in culture bottles   Culture   Final    NO GROWTH < 24 HOURS Performed at [facility], [place] [address] [place], [place] [address]    Report Status PENDING  Incomplete         Radiology Studies: Dg  Chest Port 1 View  Result Date: 01/14/2019 CLINICAL DATA:  Poor mobility.  Asthma. EXAM: PORTABLE CHEST 1 VIEW COMPARISON:  November 10, 2018 FINDINGS: A tracheostomy tube terminates over the tracheal air column. No pneumothorax. Skin staples project over the right side of the chest and neck. An oval density over the right chest is thought to be something on the patient. The cardiomediastinal silhouette is normal. The lungs are clear. IMPRESSION: There appears to be an object on the right side of the chest. Recommend clinical correlation. Post surgical staples are  noted over the right. No pneumothorax. The lungs are clear. Electronically Signed   By: Dorise Bullion III M.D   On: 01/14/2019 11:36   Dg Abd Portable 1v  Result Date: 01/13/2019 CLINICAL DATA:  Encounter for gastrostomy tube positioning. EXAM: PORTABLE ABDOMEN - 1 VIEW COMPARISON:  01/13/2019 FINDINGS: Contrast was injected through the gastrostomy tube. There is contrast within the stomach and proximal small bowel. Curvature in the thoracolumbar spine could be related to patient positioning and underlying scoliosis. Evidence for surgical mesh in the lower abdomen and pelvic region. IMPRESSION: Contrast in the stomach and proximal small bowel. Findings confirm gastrostomy tube placement in the stomach. Electronically Signed   By: Markus Daft M.D.   On: 01/13/2019 14:40        Scheduled Meds: . artificial tears   Right Eye QHS  . bacitracin  1 application Topical [id number]  . chlorhexidine  15 mL Mouth Rinse BID  . Chlorhexidine Gluconate Cloth  6 each Topical Q0600  . enoxaparin (LOVENOX) injection  40 mg Subcutaneous Q24H  . feeding supplement  1 Container Oral TID BM  . feeding supplement (PRO-STAT SUGAR FREE 64)  30 mL Per Tube BID  . free water  200 mL Per Tube Q8H  . gabapentin  100 mg Oral TID  . mouth rinse  15 mL Mouth Rinse q12n4p  . Melatonin  6 mg Oral QHS  . memantine  5 mg Oral Daily  . multivitamin with minerals  1 tablet  Oral Daily  . mupirocin ointment  1 application Nasal BID  . pantoprazole  40 mg Oral Daily  . pramipexole  0.125 mg Oral QHS  . sertraline  50 mg Oral Daily   Continuous Infusions: . ceFEPime (MAXIPIME) IV 2 g (01/14/19 2219)  . dextrose 5 % and 0.45 % NaCl with KCl 20 mEq/L 75 mL/hr at 01/14/19 1200  . feeding supplement (OSMOLITE 1.5 CAL) 1,000 mL (01/15/19 0218)  . vancomycin       LOS: 4 days    Time spent: 30 minutes    Barb Merino, MD [facility] Pager [phone]

## 2019-01-15 NOTE — Care Management Important Message (Signed)
Important Message  Patient Details  Name: Anthony [name] MRN: [id number] Date of Birth: [birth year]   Medicare Important Message Given:  Yes     Memory Argue 01/15/2019, 3:36 PM

## 2019-01-15 NOTE — Evaluation (Signed)
Occupational Therapy Evaluation Patient Details Name: Anthony [name] MRN: [id number] DOB: [birth year] Today's Date: 01/15/2019    History of Present Illness Anthony [name] is an 78 y.o. male with squamous cell carcinoma of right neck s/p radical neck dissection, parotidectomy and cervical pectoral flap. PMH includes CKD stage III, COPD, dementia, depression, chronic bladder outlet obstruction and urinary retention, BPH s/p suprapubic catheter, restless leg syndrome, chronic trach, PEG tube admitted for radical neck dissection secondary to underlying squamous cell carcinoma of right neck.   Clinical Impression   Pt admitted for above and limited by problem list below, including pain, generalized weakness, impaired balance, decreased activity tolerance and impaired cognition.  Patient with hx of dementia, but reports independent with mobility and basic ADLs PTA, residing at [facility].  Patient currently requires max-total assist for UB/LB ADLs, total assist +2 for toileting bed level, and mod-max assist for bed mobility (limited to EOB only d/t pain and dizziness). Cognitively, oriented to self and place only, follows 1 step commands with increased time. VSS during session. Patient will benefit from continued OT services while admitted and after dc at SNF level in order to optimize return to PLOF with ADLs/mobility.     Follow Up Recommendations  SNF;Supervision/Assistance - 24 hour    Equipment Recommendations  Other (comment)(TBD at next venue of care)    Recommendations for Other Services       Precautions / Restrictions Precautions Precautions: Fall Precaution Comments: trach collar 28% 5L 02, JP drain, PEG tube Restrictions Weight Bearing Restrictions: No      Mobility Bed Mobility Overal bed mobility: Needs Assistance Bed Mobility: Rolling;Sidelying to Sit;Sit to Sidelying Rolling: Min assist Sidelying to sit: Mod assist;HOB elevated;+2 for physical assistance     Sit to sidelying:  Max assist;+2 for physical assistance;HOB elevated General bed mobility comments: Rolling to right/left with Min A and constant cues for pericare and to change bed linens; assist to elevate trunk to get to EOB, increased pain. Assist to bring LEs into bed and lower trunk to return to supine.  Transfers                 General transfer comment: Deferred due to dizziness and pain.    Balance Overall balance assessment: Needs assistance Sitting-balance support: Feet supported;No upper extremity supported Sitting balance-Leahy Scale: Fair Sitting balance - Comments: Able to sit statically with Min A-Min guard assist, dizziness.                                   ADL either performed or assessed with clinical judgement   ADL Overall ADL's : Needs assistance/impaired     Grooming: Moderate assistance;Sitting   Upper Body Bathing: Moderate assistance;Sitting   Lower Body Bathing: Total assistance;+2 for physical assistance;+2 for safety/equipment;Bed level   Upper Body Dressing : Maximal assistance;Bed level Upper Body Dressing Details (indicate cue type and reason): to don new gown  Lower Body Dressing: Total assistance;+2 for physical assistance;+2 for safety/equipment;Bed level     Toilet Transfer Details (indicate cue type and reason): deferred, pt declined Toileting- Water quality scientist and Hygiene: Total assistance;+2 for physical assistance;+2 for safety/equipment;Bed level Toileting - Clothing Manipulation Details (indicate cue type and reason): total assist for hygiene after incontinent BM, bed level     Functional mobility during ADLs: Moderate assistance;+2 for physical assistance;+2 for safety/equipment(limited to EOB only) General ADL Comments: pt limited by pain, weakness, impaired balance  Vision Baseline Vision/History: Wears glasses Wears Glasses: Reading only Patient Visual Report: No change from baseline Vision Assessment?: No apparent  visual deficits     Perception     Praxis      Pertinent Vitals/Pain Pain Assessment: Faces Faces Pain Scale: Hurts whole lot Pain Location: right side of neck with mobility Pain Descriptors / Indicators: Sore;Grimacing;Guarding;Moaning;Operative site guarding;Discomfort Pain Intervention(s): Monitored during session;Repositioned;Limited activity within patient's tolerance     Hand Dominance Right   Extremity/Trunk Assessment Upper Extremity Assessment Upper Extremity Assessment: Generalized weakness;RUE deficits/detail RUE Deficits / Details: limited AROM due to pain from surgery RUE: Unable to fully assess due to pain   Lower Extremity Assessment Lower Extremity Assessment: Defer to PT evaluation   Cervical / Trunk Assessment Cervical / Trunk Assessment: Other exceptions Cervical / Trunk Exceptions: s/p neck surgery   Communication Communication Communication: Tracheostomy   Cognition Arousal/Alertness: Lethargic Behavior During Therapy: Flat affect Overall Cognitive Status: Impaired/Different from baseline Area of Impairment: Orientation;Following commands;Awareness;Problem solving                 Orientation Level: Disoriented to;Situation;Time     Following Commands: Follows one step commands with increased time   Awareness: Intellectual Problem Solving: Requires verbal cues;Difficulty sequencing;Decreased initiation;Slow processing General Comments: oriented to person, place only - able to state hospital with options as well as year with options. Slow to respond to questions, difficult to udnerstand due to trach.; noted history of dementia   General Comments  Incision along neck/chest- clean dry with staples intact    Exercises     Shoulder Instructions      Home Living Family/patient expects to be discharged to:: Skilled nursing facility                                 Additional Comments: in [place], at SNF for approx 2 months getting  rehab       Prior Functioning/Environment Level of Independence: Needs assistance  Gait / Transfers Assistance Needed: ambulates with no AD  ADL's / Homemaking Assistance Needed: independent basic ADLs             OT Problem List: Decreased strength;Decreased range of motion;Decreased activity tolerance;Impaired balance (sitting and/or standing);Decreased cognition;Decreased safety awareness;Decreased coordination;Decreased knowledge of use of DME or AE;Decreased knowledge of precautions;Pain;Impaired UE functional use      OT Treatment/Interventions: Self-care/ADL training;DME and/or AE instruction;Therapeutic activities;Patient/family education;Balance training;Therapeutic exercise    OT Goals(Current goals can be found in the care plan section) Acute Rehab OT Goals Patient Stated Goal: "to lay down" OT Goal Formulation: With patient Time For Goal Achievement: 01/29/19 Potential to Achieve Goals: Good  OT Frequency: Min 2X/week   Barriers to D/C:            Co-evaluation PT/OT/SLP Co-Evaluation/Treatment: Yes Reason for Co-Treatment: For patient/therapist safety;To address functional/ADL transfers;Complexity of the patient's impairments (multi-system involvement);Necessary to address cognition/behavior during functional activity PT goals addressed during session: Mobility/safety with mobility OT goals addressed during session: ADL's and self-care      AM-PAC OT "6 Clicks" Daily Activity     Outcome Measure Help from another person eating meals?: Total Help from another person taking care of personal grooming?: A Lot Help from another person toileting, which includes using toliet, bedpan, or urinal?: Total Help from another person bathing (including washing, rinsing, drying)?: A Lot Help from another person to put on and taking off regular upper body clothing?:  A Lot Help from another person to put on and taking off regular lower body clothing?: Total 6 Click Score: 9    End of Session Equipment Utilized During Treatment: Oxygen(via trach collar) Nurse Communication: Mobility status;Precautions  Activity Tolerance: Patient tolerated treatment well Patient left: in bed;with call bell/phone within reach;with bed alarm set  OT Visit Diagnosis: Other abnormalities of gait and mobility (R26.89);Muscle weakness (generalized) (M62.81);Pain;Other symptoms and signs involving cognitive function Pain - Right/Left: Right Pain - part of body: Shoulder(neck)                Time: [id number] OT Time Calculation (min): 30 min Charges:  OT General Charges $OT Visit: 1 Visit OT Evaluation $OT Eval Moderate Complexity: 1 [place], OT Acute Rehabilitation Services Pager [phone] Office [phone]   Delight Stare 01/15/2019, 1:32 PM

## 2019-01-15 NOTE — Progress Notes (Signed)
   Subjective:    Patient ID: Anthony [name], male    DOB: Nov 15, 1940, 78 y.o.   MRN: [id number]  HPI He reports feeling pretty good with good energy level.  Review of Systems     Objective:   Physical Exam Tm 103.1 P 72-87 R 17-25 BP 79/51-119/42  Drains 15 neck and 5 chest Alert, NAD Right facial weakness in all divisions Neck/chest flap intact with duskiness of distal tip of flap, staples in position, drains functioning, removed chest drain  WBC 6.5, Hgb 7.5    Assessment & Plan:  Neck cancer s/p right RND, parotidectomy, and cerviopectoral flap; fever  Chest drain removed.  Neck drain remains in place.  Flap doing fine.  Distal flap necrosis is not unusual and typically recovers.  Facial nerve function remains weak in all divisions.  Spiked fever again last night.  WBC normal.  Has been started on vancomycin and cefipime.  Urinary infection may be most likely.  Atelectasis also possible.  PT/OT have been consulted.  Hgb sagging but no active surgical bleeding.  Appreciate hospitalist input.

## 2019-01-16 LAB — CBC WITH DIFFERENTIAL/PLATELET
Abs Immature Granulocytes: 0.06 10*3/uL (ref 0.00–0.07)
Basophils Absolute: 0 10*3/uL (ref 0.0–0.1)
Basophils Relative: 1 %
Eosinophils Absolute: 0.6 10*3/uL — ABNORMAL HIGH (ref 0.0–0.5)
Eosinophils Relative: 8 %
HCT: 25.2 % — ABNORMAL LOW (ref 39.0–52.0)
Hemoglobin: 7.8 g/dL — ABNORMAL LOW (ref 13.0–17.0)
Immature Granulocytes: 1 %
Lymphocytes Relative: 10 %
Lymphs Abs: 0.8 10*3/uL (ref 0.7–4.0)
MCH: 30.1 pg (ref 26.0–34.0)
MCHC: 31 g/dL (ref 30.0–36.0)
MCV: 97.3 fL (ref 80.0–100.0)
Monocytes Absolute: 1.1 10*3/uL — ABNORMAL HIGH (ref 0.1–1.0)
Monocytes Relative: 15 %
Neutro Abs: 5 10*3/uL (ref 1.7–7.7)
Neutrophils Relative %: 65 %
Platelets: 217 10*3/uL (ref 150–400)
RBC: 2.59 MIL/uL — ABNORMAL LOW (ref 4.22–5.81)
RDW: 14.8 % (ref 11.5–15.5)
WBC: 7.6 10*3/uL (ref 4.0–10.5)
nRBC: 0 % (ref 0.0–0.2)

## 2019-01-16 LAB — BASIC METABOLIC PANEL
Anion gap: 7 (ref 5–15)
BUN: 28 mg/dL — ABNORMAL HIGH (ref 8–23)
CO2: 20 mmol/L — ABNORMAL LOW (ref 22–32)
Calcium: 7.8 mg/dL — ABNORMAL LOW (ref 8.9–10.3)
Chloride: 110 mmol/L (ref 98–111)
Creatinine, Ser: 1.45 mg/dL — ABNORMAL HIGH (ref 0.61–1.24)
GFR calc Af Amer: 52 mL/min — ABNORMAL LOW (ref >60)
GFR calc non Af Amer: 45 mL/min — ABNORMAL LOW (ref >60)
Glucose, Bld: 148 mg/dL — ABNORMAL HIGH (ref 70–99)
Potassium: 4.2 mmol/L (ref 3.5–5.1)
Sodium: 137 mmol/L (ref 135–145)

## 2019-01-16 LAB — PHOSPHORUS: Phosphorus: 3.1 mg/dL (ref 2.5–4.6)

## 2019-01-16 LAB — GLUCOSE, CAPILLARY: Glucose-Capillary: 127 mg/dL — ABNORMAL HIGH (ref 70–99)

## 2019-01-16 LAB — MAGNESIUM: Magnesium: 2.1 mg/dL (ref 1.7–2.4)

## 2019-01-16 LAB — PROCALCITONIN: Procalcitonin: 0.21 ng/mL

## 2019-01-16 LAB — URINE CULTURE: Culture: NO GROWTH

## 2019-01-16 MED ORDER — SODIUM CHLORIDE 0.9 % IV SOLN
25.0000 mg | Freq: Once | INTRAVENOUS | Status: AC
  Administered 2019-01-16: 25 mg via INTRAVENOUS
  Filled 2019-01-16: qty 0.5

## 2019-01-16 MED ORDER — DIPHENHYDRAMINE HCL 50 MG/ML IJ SOLN
25.0000 mg | Freq: Once | INTRAMUSCULAR | Status: AC
  Administered 2019-01-16: 25 mg via INTRAVENOUS
  Filled 2019-01-16: qty 1

## 2019-01-16 MED ORDER — SODIUM CHLORIDE 0.9 % IV SOLN
500.0000 mg | Freq: Once | INTRAVENOUS | Status: DC
  Administered 2019-01-16: 500 mg via INTRAVENOUS
  Filled 2019-01-16: qty 10

## 2019-01-16 MED ORDER — GERHARDT'S BUTT CREAM
TOPICAL_CREAM | CUTANEOUS | Status: DC | PRN
  Administered 2019-01-16 – 2019-01-21 (×3): via TOPICAL
  Filled 2019-01-16: qty 1

## 2019-01-16 NOTE — Progress Notes (Signed)
   Subjective:    Patient ID: Anthony [name], male    DOB: 10-May-1940, 78 y.o.   MRN: [id number]  HPI No complaints.  Reports feeling fine.  Review of Systems     Objective:   Physical Exam Tm 99.4 P 66-84 R 15-25 BP 83/54-114/54  Drain 20 cc Alert, NAD Right facial weakness Right neck/chest incision clean and intact, no fluid collection, removed neck drain, distal extent of flap remains dusky and unchanged WBC 7.6 Hgb 7.8    Assessment & Plan:  Right neck cancer s/p right parotidectomy with neck dissection and cervicopectoral flap  Removed neck drain.  Fever curve improved.  Hemoglobin stable.  Continue PT/OT.  Discharge planning.

## 2019-01-16 NOTE — Progress Notes (Signed)
Developed fever after a test dose of iron dextran. Discontinue. Tylenol and benadryl to be given. Less likely systemic infection, more likely reaction to iron transfusion.

## 2019-01-16 NOTE — Plan of Care (Signed)
  Problem: Clinical Measurements: Goal: Respiratory complications will improve Outcome: Progressing Goal: Cardiovascular complication will be avoided Outcome: Progressing   

## 2019-01-16 NOTE — Progress Notes (Signed)
PROGRESS NOTE    Anthony [name]  [id number] DOB: [birth year] DOA: 01/11/2019 PCP: Pablo Ledger, MD    Brief Narrative:  Anthony [name] is an 78 y.o. 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  Patient developed fever 103 with leukocytosis on day 2 surgery.  Medicine consulted.   Assessment & Plan:   Active Problems:   Anemia   Tobacco abuse   COPD (chronic obstructive pulmonary disease) (HCC)   CKD (chronic kidney disease) stage 3, GFR 30-59 ml/min   Dementia (HCC)   Tracheostomy in place (HCC)   Head and neck cancer (HCC)   Malnutrition of moderate degree   Depression   Suprapubic catheter (HCC)   Restless leg syndrome  Postoperative fever: Likely UTI due to indwelling suprapubic catheter.  Patient did have tenderness and pus draining around the suprapubic tube. Blood cultures are negative so far. Urine cultures with multiple organisms.   Exchanged suprapubic catheter, reculture urine after new catheter placement pending.  Dc vanco. Continue cefepime today.  Continue to work with therapies and mobility.  Chronic kidney disease stage III: On maintenance IV fluids.  Stable around at baseline.  Acute on chronic anemia of chronic disease: Hemoglobin 7.8.  No evidence of active bleeding.  Probably chronic anemia and aggravated by acute surgical procedures.  Monitor closely.  Currently no indication for transfusion. His iron levels are low. Will transfuse one unit of iv iron while in the hospital.   Dementia, head and neck cancer, deconditioning and impaired mobility: Has tracheostomy and functioning well.   Continue aggressive chest physiotherapy.  Chest x-ray was normal. PEG tube infusing well.  Patient also allowed clear liquid diet. Other chronic medical issues including dementia, restless leg syndrome remained stable.  Thank you for involving [place] in this patient's care.  We will continue to follow throughout the hospitalization. If continues to improve , can change to oral antibiotics tomorrow for discharge. Urine may not grow as he received broad spectrum abx.  DVT prophylaxis: Lovenox subcu Code Status: Full code Family Communication: None Disposition Plan: Anticipate discharge back to nursing home when stable from surgical standpoint and afebrile.   Procedures:   Radical neck dissection and flap.  Antimicrobials:   Vancomycin, cefepime, 01/14/2019----   Subjective: Patient seen and examined.  Blood pressures are stable. Afebrile last 24 hours. " I have been at the nursing home for long time"  Objective: Vitals:   01/16/19 0317 01/16/19 0425 01/16/19 0746 01/16/19 0751  BP: (!) 92/42  (!) 95/45 (!) 95/45  Pulse: 79 76 75 72  Resp: 18 16 19 17   Temp: 98.6 F (37 C)  98.5 F (36.9 C)   TempSrc: Oral  Axillary   SpO2:   100% 99%  Weight:      Height:        Intake/Output Summary (Last 24 hours) at 01/16/2019 1105 Last data filed at 01/16/2019 0830 Gross per 24 hour  Intake 2800.73 ml  Output 3020 ml  Net -219.27 ml   Filed Weights   01/11/19 0717  Weight: 75.3 kg    Examination:  General exam: Appears calm and comfortable, chronically sick looking. Respiratory system: Clear to auscultation. Respiratory effort normal.  Mostly conducted airway sounds, mucoid secretions on the tracheostomy.  On trach collar. Right neck incision clean , dry, some edema , sloughing at the incision margin. Cardiovascular system: S1 & S2 heard, RRR. No JVD, murmurs, rubs, gallops or clicks. No pedal edema. Gastrointestinal system: Abdomen is nondistended, soft.  PEG tube intact and  feeding infusing.  No organomegaly or masses felt. Normal bowel sounds heard. Patient has tenderness along the suprapubic area mostly on the left side, suprapubic catheter intact with thin purulent material expressing from the wound. Central nervous system: Alert and oriented. No focal neurological deficits.  Able to move all extremities. Extremities: Symmetric 5 x 5 power. Skin: No rashes, lesions or ulcers Psychiatry: Judgement and insight appear normal. Mood & affect appropriate.     Data Reviewed: I have personally reviewed following labs and imaging studies  CBC: Recent Labs  Lab 01/11/19 0740 01/13/19 0959 01/14/19 1137 01/15/19 0233 01/16/19 0214  WBC 8.2 11.0* 12.1* 6.5 7.6  NEUTROABS  --   --  10.5* 4.8 5.0  HGB 10.0* 8.1* 8.0* 7.5* 7.8*  HCT 32.7* 26.9* 26.3* 24.1* 25.2*  MCV 98.5 98.9 96.3 95.3 97.3  PLT 240 273 277 228 [id number]   Basic Metabolic Panel: Recent Labs  Lab 01/11/19 0740 01/13/19 0959 01/14/19 1137 01/15/19 0233 01/16/19 0214  NA 137  --  137 136 137  K 3.8  --  4.0 4.4 4.2  CL 105  --  109 110 110  CO2 21*  --  20* 20* 20*  GLUCOSE 116*  --  151* 176* 148*  BUN 34*  --  29* 29* 28*  CREATININE 2.03* 1.80* 1.74* 1.66* 1.45*  CALCIUM 9.0  --  8.0* 7.6* 7.8*  MG  --   --   --   --  2.1  PHOS  --   --   --   --  3.1   GFR: Estimated Creatinine Clearance: 43.3 mL/min (A) (by C-G formula based on SCr of 1.45 mg/dL (H)). Liver Function Tests: No results for input(s): AST, ALT, ALKPHOS, BILITOT, PROT, ALBUMIN in the last 168 hours. No results for input(s): LIPASE, AMYLASE in the last 168 hours. No results for input(s): AMMONIA in the last 168 hours. Coagulation Profile: No results for input(s): INR, PROTIME in the last 168 hours. Cardiac Enzymes: No results for input(s): CKTOTAL, CKMB, CKMBINDEX, TROPONINI in the last 168 hours. BNP (last 3 results) No results for input(s): PROBNP in the last 8760 hours. HbA1C: No results for input(s): HGBA1C in  the last 72 hours. CBG: Recent Labs  Lab 01/11/19 2058  GLUCAP 149*   Lipid Profile: No results for input(s): CHOL, HDL, LDLCALC, TRIG, CHOLHDL, LDLDIRECT in the last 72 hours. Thyroid Function Tests: No results for input(s): TSH, T4TOTAL, FREET4, T3FREE, THYROIDAB in the last 72 hours. Anemia Panel: Recent Labs    01/14/19 1514  FERRITIN 286  TIBC 182*  IRON 8*   Sepsis Labs: Recent Labs  Lab 01/14/19 1137 01/14/19 1514 01/15/19 0233 01/16/19 0214  PROCALCITON 0.26  --  0.28 0.21  LATICACIDVEN 1.6 1.2  --   --     Recent Results (from the past 240 hour(s))  SARS CORONAVIRUS 2 (TAT 6-24 HRS) Nasopharyngeal Nasopharyngeal Swab     Status: None   Collection Time: 01/11/19  5:30 PM   Specimen: Nasopharyngeal Swab  Result Value Ref Range Status   SARS Coronavirus 2 NEGATIVE NEGATIVE Final    Comment: (NOTE) SARS-CoV-2 target nucleic acids are NOT DETECTED.  The SARS-CoV-2 RNA is generally detectable in upper and lower respiratory specimens during the acute phase of infection. Negative results do not preclude SARS-CoV-2 infection, do not rule out co-infections with other pathogens, and should not be used as the sole basis for treatment or other patient management decisions. Negative results must be combined with clinical observations, patient history, and epidemiological information. The expected result is Negative. Fact Sheet for Patients: SugarRoll.be Fact Sheet for Healthcare Providers: https://www.woods-mathews.com/ This test is not yet approved or cleared by the [place] FDA and  has been authorized for detection and/or diagnosis of SARS-CoV-2 by FDA under an Emergency Use Authorization (EUA). This EUA will remain  in effect (meaning this test can be used) for the duration of the COVID-19 declaration under Section 56 4(b)(1) of the Act, 21 U.S.C. section 360bbb-3(b)(1), unless the authorization is terminated or  revoked sooner. Performed at [facility], [place] [address], [place], [place] [address]   MRSA PCR Screening     Status: Abnormal   Collection Time: 01/12/19 12:54 AM   Specimen: Nasopharyngeal  Result Value Ref Range Status   MRSA by PCR POSITIVE (A) NEGATIVE Final    Comment:        The GeneXpert MRSA Assay (FDA approved for NASAL specimens only), is one component of a comprehensive MRSA colonization surveillance program. It is not intended to diagnose MRSA infection nor to guide or monitor treatment for MRSA infections. RESULT CALLED TO, READ BACK BY AND VERIFIED WITH: NEWCOMER,S RN 01/12/2019 AT [id number] SKEEN,P Performed at [facility], [place] [address] [place], [place] [address]   Culture, Urine     Status: Abnormal   Collection Time: 01/14/19  2:00 PM   Specimen: Urine, Catheterized  Result Value Ref Range Status   Specimen Description URINE, CATHETERIZED  Final   Special Requests   Final    NONE Performed at [facility], [place] [address] [place], [place] [address]    Culture MULTIPLE SPECIES PRESENT, SUGGEST RECOLLECTION (A)  Final   Report Status 01/15/2019 FINAL  Final  Culture, blood (routine x 2)     Status: None (Preliminary result)   Collection Time: 01/14/19  3:44 PM   Specimen: BLOOD RIGHT HAND  Result Value Ref Range Status   Specimen Description BLOOD RIGHT HAND  Final   Special Requests   Final    BOTTLES DRAWN AEROBIC ONLY Blood Culture adequate volume   Culture   Final    NO GROWTH 2 DAYS Performed at [facility], [place] [address] [place], [place] [address]    Report Status PENDING  Incomplete  Culture, blood (routine x 2)     Status: None (Preliminary result)   Collection Time: 01/14/19  3:44 PM   Specimen: BLOOD RIGHT ARM  Result Value Ref Range Status   Specimen Description BLOOD RIGHT ARM  Final   Special Requests   Final    BOTTLES DRAWN AEROBIC AND ANAEROBIC Blood Culture results may not be optimal due to an excessive  volume of blood received in culture bottles   Culture   Final    NO GROWTH 2 DAYS Performed at [facility], [place] [address], [place], [place] [address]    Report Status PENDING  Incomplete         Radiology Studies: Dg Chest Port 1 View  Result Date: 01/14/2019 CLINICAL DATA:  Poor mobility.  Asthma. EXAM: PORTABLE CHEST 1 VIEW COMPARISON:  November 10, 2018 FINDINGS:  A tracheostomy tube terminates over the tracheal air column. No pneumothorax. Skin staples project over the right side of the chest and neck. An oval density over the right chest is thought to be something on the patient. The cardiomediastinal silhouette is normal. The lungs are clear. IMPRESSION: There appears to be an object on the right side of the chest. Recommend clinical correlation. Post surgical staples are noted over the right. No pneumothorax. The lungs are clear. Electronically Signed   By: Dorise Bullion III M.D   On: 01/14/2019 11:36        Scheduled Meds: . artificial tears   Right Eye QHS  . bacitracin  1 application Topical [id number]  . chlorhexidine  15 mL Mouth Rinse BID  . Chlorhexidine Gluconate Cloth  6 each Topical Q0600  . enoxaparin (LOVENOX) injection  40 mg Subcutaneous Q24H  . feeding supplement  1 Container Oral TID BM  . feeding supplement (PRO-STAT SUGAR FREE 64)  30 mL Per Tube BID  . free water  200 mL Per Tube Q8H  . gabapentin  100 mg Oral TID  . mouth rinse  15 mL Mouth Rinse q12n4p  . Melatonin  6 mg Oral QHS  . memantine  5 mg Oral Daily  . multivitamin with minerals  1 tablet Oral Daily  . mupirocin ointment  1 application Nasal BID  . pantoprazole  40 mg Oral Daily  . pramipexole  0.125 mg Oral QHS  . sertraline  50 mg Oral Daily   Continuous Infusions: . ceFEPime (MAXIPIME) IV 2 g (01/16/19 1020)  . dextrose 5 % and 0.45 % NaCl with KCl 20 mEq/L 1,000 mL (01/16/19 0317)  . feeding supplement (OSMOLITE 1.5 CAL) 1,000 mL (01/16/19 0018)     LOS: 5 days    Time spent:  30 minutes    Barb Merino, MD [facility] Pager [phone]

## 2019-01-17 LAB — CBC WITH DIFFERENTIAL/PLATELET
Abs Immature Granulocytes: 0 10*3/uL (ref 0.00–0.07)
Basophils Absolute: 0.2 10*3/uL — ABNORMAL HIGH (ref 0.0–0.1)
Basophils Relative: 2 %
Eosinophils Absolute: 0.4 10*3/uL (ref 0.0–0.5)
Eosinophils Relative: 4 %
HCT: 23.1 % — ABNORMAL LOW (ref 39.0–52.0)
Hemoglobin: 7 g/dL — ABNORMAL LOW (ref 13.0–17.0)
Lymphocytes Relative: 8 %
Lymphs Abs: 0.9 10*3/uL (ref 0.7–4.0)
MCH: 29.9 pg (ref 26.0–34.0)
MCHC: 30.3 g/dL (ref 30.0–36.0)
MCV: 98.7 fL (ref 80.0–100.0)
Monocytes Absolute: 0.2 10*3/uL (ref 0.1–1.0)
Monocytes Relative: 2 %
Neutro Abs: 9.2 10*3/uL — ABNORMAL HIGH (ref 1.7–7.7)
Neutrophils Relative %: 84 %
Platelets: 179 10*3/uL (ref 150–400)
RBC: 2.34 MIL/uL — ABNORMAL LOW (ref 4.22–5.81)
RDW: 15.2 % (ref 11.5–15.5)
WBC: 10.9 10*3/uL — ABNORMAL HIGH (ref 4.0–10.5)
nRBC: 0 % (ref 0.0–0.2)
nRBC: 0 /100 WBC

## 2019-01-17 LAB — CORTISOL: Cortisol, Plasma: 12.5 ug/dL

## 2019-01-17 LAB — SURGICAL PATHOLOGY

## 2019-01-17 LAB — PREPARE RBC (CROSSMATCH)

## 2019-01-17 LAB — LACTIC ACID, PLASMA: Lactic Acid, Venous: 1 mmol/L (ref 0.5–1.9)

## 2019-01-17 MED ORDER — SODIUM CHLORIDE 0.9% IV SOLUTION
Freq: Once | INTRAVENOUS | Status: AC
  Administered 2019-01-17: 15:00:00 via INTRAVENOUS

## 2019-01-17 MED ORDER — LACTATED RINGERS IV BOLUS
500.0000 mL | Freq: Once | INTRAVENOUS | Status: AC
  Administered 2019-01-17: 500 mL via INTRAVENOUS

## 2019-01-17 MED ORDER — SODIUM CHLORIDE 0.9 % IV BOLUS
1000.0000 mL | Freq: Once | INTRAVENOUS | Status: AC
  Administered 2019-01-17: 1000 mL via INTRAVENOUS

## 2019-01-17 NOTE — Progress Notes (Signed)
BP soft, 88/42.  [facility] hospital[facility] paged.  Will continue to monitor.

## 2019-01-17 NOTE — Progress Notes (Signed)
Pharmacy Antibiotic Note  Anthony [name] is a 78 y.o. male admitted on 01/11/2019 with known lower lip and scalp cancers, s/p OR for surgical management of R upper neck mass.  Pharmacy has been consulted for cefepime dosing for postoperative fever.  Currently on D4 of antibiotics - WBC 7.6, Tmax in 24 hours 102.1 on 11/3, no growth on cx. Scr 1.45 (CrCl 43 mL/min).  Plan: Cefepime 2 g IV q12h Monitor cultures, clinical status, length of therapy  Height: 6\' 3"  (190.5 cm) Weight: 166 lb (75.3 kg) IBW/kg (Calculated) : 84.5  Temp (24hrs), Avg:99.7 F (37.6 C), Min:98.2 F (36.8 C), Max:102.1 F (38.9 C)  Recent Labs  Lab 01/11/19 0740 01/13/19 0959 01/14/19 1137 01/14/19 1514 01/15/19 0233 01/16/19 0214  WBC 8.2 11.0* 12.1*  --  6.5 7.6  CREATININE 2.03* 1.80* 1.74*  --  1.66* 1.45*  LATICACIDVEN  --   --  1.6 1.2  --   --     Estimated Creatinine Clearance: 43.3 mL/min (A) (by C-G formula based on SCr of 1.45 mg/dL (H)).    Allergies  Allergen Reactions  . Bee Venom Anaphylaxis  . Penicillins Hives    Has patient had a PCN reaction causing immediate rash, facial/tongue/throat swelling, SOB or lightheadedness with hypotension: NO Has patient had a PCN reaction causing severe rash involving mucus membranes or skin necrosis: NO Has patient had a PCN reaction that required hospitalization: NO Has patient had a PCN reaction occurring within the last 10 years: NO If all of the above answers are "NO", then may proceed with Cephalosporin use.   Anthony [name] Extract Rash    Antimicrobials this admission: Periop clindamycin 10/29>>10/30 Vanc 11/1>>11/3 Cefepime 11/1>>  Dose adjustments this admission: None  Microbiology results: 11/1 UCx: multiple species 11/1 BCx x 2: NGTD 10/30 MRSA: positive 10/29 COVID: neg  Thank you for allowing pharmacy to be a part of this patient's care.  Anthony [name], PharmD, BCCCP Clinical Pharmacist  Phone: [phone]  Please check AMION  for all [place] phone numbers After 10:00 PM, call [place] [phone] 01/17/2019       11:18 AM  Please check [email or web address] for unit-specific pharmacist phone numbers

## 2019-01-17 NOTE — Progress Notes (Signed)
PROGRESS NOTE    Anthony [name]  [id number] DOB: 07/22/1940 DOA: 01/11/2019 PCP: Pablo Ledger, MD    Brief Narrative:  Anthony [name] is an 78 y.o. 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  Patient developed fever 103 with leukocytosis on day 2 surgery.  Medicine consulted.   Assessment & Plan:   Active Problems:   Anemia   Tobacco abuse   COPD (chronic obstructive pulmonary disease) (HCC)   CKD (chronic kidney disease) stage 3, GFR 30-59 ml/min   Dementia (HCC)   Tracheostomy in place (HCC)   Head and neck cancer (HCC)   Malnutrition of moderate degree   Depression   Suprapubic catheter (HCC)   Restless leg syndrome  Postoperative fever: Likely UTI due to indwelling suprapubic catheter.  Patient did have tenderness and pus draining around the suprapubic tube.Blood cultures are negative so far.Urine cultures with multiple organisms.   Exchanged suprapubic catheter, reculture urine after new catheter placement pending.  Continue cefepime today.  Continue to work with therapies and mobility. He had recurrent fever with no explanation yesterday evening after iron transfusion, probably related to iron. We will recheck CBC today.  If hemoglobin less than 7, will transfuse. Blood pressures low normal.  1 L bolus today.  Chronic kidney disease stage III: On maintenance IV fluids.  Stable around at baseline.  Acute on chronic anemia of chronic disease: Hemoglobin 7.8.  No evidence of active bleeding.  Probably chronic anemia and aggravated by acute surgical procedures.  Recheck today.  Iron transfusion caused  reaction.  Dementia, head and neck cancer, deconditioning and impaired mobility: Has tracheostomy and functioning well.  Continue aggressive chest physiotherapy.  Chest x-ray was normal. PEG tube infusing well.  Patient also allowed clear liquid diet. Other chronic medical issues including dementia, restless leg syndrome remained stable.  We will continue to follow.  Antibiotics until 24 hours without fever.  DVT prophylaxis: Lovenox subcu Code Status: Full code Family Communication: None Disposition Plan: Anticipate discharge back to nursing home when stable from surgical standpoint and afebrile.   Procedures:   Radical neck dissection and flap.  Antimicrobials:   Vancomycin, 01/14/2019-01/16/2019   cefepime, 01/14/2019----   Subjective: Patient seen and examined.  Blood pressures remain low, 88/41 on my examination.  As usual no localizing complaints.  Patient jumps up on every exam.  Urine is clear.  Cultures negative so far.   Objective: Vitals:   01/17/19 0824 01/17/19 0844 01/17/19 1136 01/17/19 1150  BP: (!) 88/41   (!) 89/43  Pulse: 74 74 67 72  Resp:  (!) 21 18   Temp: 99.1 F (37.3 C)   98.9 F (37.2 C)  TempSrc: Axillary   Axillary  SpO2:  97% 99%   Weight:      Height:        Intake/Output Summary (Last 24 hours) at 01/17/2019 1351 Last data filed at 01/16/2019 1947 Gross per 24 hour  Intake 851.34 ml  Output 1350 ml  Net -498.66 ml   Filed Weights   01/11/19 0717  Weight: 75.3 kg    Examination:  General exam: Appears calm and comfortable, chronically sick looking. Right neck surgical scar clean and dry.  Respiratory system: Clear to auscultation. Respiratory effort normal.  Mostly conducted airway sounds, On trach collar. Right neck incision clean , dry. Cardiovascular system: S1 & S2 heard, RRR. No JVD, murmurs, rubs, gallops or clicks. No pedal edema. Gastrointestinal system: Abdomen is nondistended, soft.  PEG tube intact and feeding  infusing.  No organomegaly or masses felt. Normal bowel sounds heard. Central nervous system: Alert and oriented. No focal neurological deficits.  Able to move all extremities. Extremities: Symmetric 5 x 5 power. Skin: No rashes, lesions or ulcers Psychiatry: Judgement and insight appear normal. Mood & affect appropriate.     Data Reviewed: I have personally reviewed following labs and imaging studies  CBC: Recent Labs  Lab 01/11/19 0740 01/13/19 0959 01/14/19 1137 01/15/19 0233 01/16/19 0214  WBC 8.2 11.0* 12.1* 6.5 7.6  NEUTROABS  --   --  10.5* 4.8 5.0  HGB 10.0* 8.1* 8.0* 7.5* 7.8*  HCT 32.7* 26.9* 26.3* 24.1* 25.2*  MCV 98.5 98.9 96.3 95.3 97.3  PLT 240 273 277 228 [id number]   Basic Metabolic Panel: Recent Labs  Lab 01/11/19 0740 01/13/19 0959 01/14/19 1137 01/15/19 0233 01/16/19 0214  NA 137  --  137 136 137  K 3.8  --  4.0 4.4 4.2  CL 105  --  109 110 110  CO2 21*  --  20* 20* 20*  GLUCOSE 116*  --  151* 176* 148*  BUN 34*  --  29* 29* 28*  CREATININE 2.03* 1.80* 1.74* 1.66* 1.45*  CALCIUM 9.0  --  8.0* 7.6* 7.8*  MG  --   --   --   --  2.1  PHOS  --   --   --   --  3.1   GFR: Estimated Creatinine Clearance: 43.3 mL/min (A) (by C-G formula based on SCr of 1.45 mg/dL (H)). Liver Function Tests: No results for input(s): AST, ALT, ALKPHOS, BILITOT, PROT, ALBUMIN in the last 168 hours. No results for input(s): LIPASE, AMYLASE in the last 168 hours. No results for input(s): AMMONIA in the last 168 hours. Coagulation Profile: No results for input(s): INR, PROTIME in the last 168 hours. Cardiac Enzymes: No results for input(s): CKTOTAL, CKMB, CKMBINDEX, TROPONINI in the last 168 hours. BNP (last 3 results) No results for input(s): PROBNP in the last 8760 hours. HbA1C: No results for input(s): HGBA1C in the last 72 hours. CBG: Recent Labs  Lab 01/11/19 2058 01/16/19 1944  GLUCAP 149* 127*   Lipid Profile: No results for input(s): CHOL, HDL, LDLCALC, TRIG,  CHOLHDL, LDLDIRECT in the last 72 hours. Thyroid Function Tests: No results for input(s): TSH, T4TOTAL, FREET4, T3FREE, THYROIDAB in the last 72 hours. Anemia Panel: Recent Labs    01/14/19 1514  FERRITIN 286  TIBC 182*  IRON 8*   Sepsis Labs: Recent Labs  Lab 01/14/19 1137 01/14/19 1514 01/15/19 0233 01/16/19 0214  PROCALCITON 0.26  --  0.28 0.21  LATICACIDVEN 1.6 1.2  --   --     Recent Results (from the past 240 hour(s))  SARS CORONAVIRUS 2 (TAT 6-24 HRS) Nasopharyngeal Nasopharyngeal Swab     Status: None   Collection Time: 01/11/19  5:30 PM   Specimen: Nasopharyngeal Swab  Result Value Ref Range Status   SARS Coronavirus 2 NEGATIVE NEGATIVE Final    Comment: (NOTE) SARS-CoV-2 target nucleic acids are NOT DETECTED. The SARS-CoV-2 RNA is generally detectable in upper and lower respiratory specimens during the acute phase of infection. Negative results do not preclude SARS-CoV-2 infection,  do not rule out co-infections with other pathogens, and should not be used as the sole basis for treatment or other patient management decisions. Negative results must be combined with clinical observations, patient history, and epidemiological information. The expected result is Negative. Fact Sheet for Patients: SugarRoll.be Fact Sheet for Healthcare Providers: https://www.woods-mathews.com/ This test is not yet approved or cleared by the [place] FDA and  has been authorized for detection and/or diagnosis of SARS-CoV-2 by FDA under an Emergency Use Authorization (EUA). This EUA will remain  in effect (meaning this test can be used) for the duration of the COVID-19 declaration under Section 56 4(b)(1) of the Act, 21 U.S.C. section 360bbb-3(b)(1), unless the authorization is terminated or revoked sooner. Performed at [facility], [place] [address], [place], [place] [address]   MRSA PCR Screening     Status: Abnormal    Collection Time: 01/12/19 12:54 AM   Specimen: Nasopharyngeal  Result Value Ref Range Status   MRSA by PCR POSITIVE (A) NEGATIVE Final    Comment:        The GeneXpert MRSA Assay (FDA approved for NASAL specimens only), is one component of a comprehensive MRSA colonization surveillance program. It is not intended to diagnose MRSA infection nor to guide or monitor treatment for MRSA infections. RESULT CALLED TO, READ BACK BY AND VERIFIED WITH: NEWCOMER,S RN 01/12/2019 AT TH:[id number] SKEEN,P Performed at [facility], [place] [address] [place], [place] [address]   Culture, Urine     Status: Abnormal   Collection Time: 01/14/19  2:00 PM   Specimen: Urine, Catheterized  Result Value Ref Range Status   Specimen Description URINE, CATHETERIZED  Final   Special Requests   Final    NONE Performed at [facility], [place] [address], [place], [place] [address]    Culture MULTIPLE SPECIES PRESENT, SUGGEST RECOLLECTION (A)  Final   Report Status 01/15/2019 FINAL  Final  Culture, blood (routine x 2)     Status: None (Preliminary result)   Collection Time: 01/14/19  3:44 PM   Specimen: BLOOD RIGHT HAND  Result Value Ref Range Status   Specimen Description BLOOD RIGHT HAND  Final   Special Requests   Final    BOTTLES DRAWN AEROBIC ONLY Blood Culture adequate volume   Culture   Final    NO GROWTH 3 DAYS Performed at [facility], [place] [address], [place], [place] [address]    Report Status PENDING  Incomplete  Culture, blood (routine x 2)     Status: None (Preliminary result)   Collection Time: 01/14/19  3:44 PM   Specimen: BLOOD RIGHT ARM  Result Value Ref Range Status   Specimen Description BLOOD RIGHT ARM  Final   Special Requests   Final    BOTTLES DRAWN AEROBIC AND ANAEROBIC Blood Culture results may not be optimal due to an excessive volume of blood received in culture bottles   Culture   Final    NO GROWTH 3 DAYS Performed at [facility], [place] [address]  [place],  [address]    Report Status PENDING  Incomplete  Culture, Urine     Status: None   Collection Time: 01/15/19 11:51 AM   Specimen: Urine, Suprapubic  Result Value Ref Range Status   Specimen Description URINE, SUPRAPUBIC  Final   Special Requests NONE  Final   Culture   Final    NO GROWTH Performed at [facility], [place]  [address], [place], [place] [address]    Report Status 01/16/2019 FINAL  Final         Radiology Studies: No results found.      Scheduled Meds: . artificial tears   Right Eye QHS  . bacitracin  1 application Topical [id number]  . chlorhexidine  15 mL Mouth Rinse BID  . enoxaparin (LOVENOX) injection  40 mg Subcutaneous Q24H  . feeding supplement  1 Container Oral TID BM  . feeding supplement (PRO-STAT SUGAR FREE 64)  30 mL Per Tube BID  . free water  200 mL Per Tube Q8H  . gabapentin  100 mg Oral TID  . mouth rinse  15 mL Mouth Rinse q12n4p  . Melatonin  6 mg Oral QHS  . memantine  5 mg Oral Daily  . multivitamin with minerals  1 tablet Oral Daily  . pantoprazole  40 mg Oral Daily  . pramipexole  0.125 mg Oral QHS  . sertraline  50 mg Oral Daily   Continuous Infusions: . ceFEPime (MAXIPIME) IV 2 g (01/17/19 1053)  . dextrose 5 % and 0.45 % NaCl with KCl 20 mEq/L 75 mL/hr at 01/16/19 1700  . feeding supplement (OSMOLITE 1.5 CAL) 1,000 mL (01/16/19 0018)     LOS: 6 days    Time spent: 30 minutes    Barb Merino, MD [facility] Pager [phone]

## 2019-01-17 NOTE — Progress Notes (Signed)
MD at bedside made aware of low bp. MD placed order for 1 L bolus of NS. RN will continue to monitor.  21 MD at bedside made aware of low bp; additional 1 L bolus ordered by MD as well as order for 1 unit PRBC.  Patient has legal guardian unable to reach legal guardian for consent. Made MD aware. RN attempted to have pt sign for blood consent along with second RN at bedside. Patient stated "no." Advised MD. MD came to bedside s/w pt; Pt advise MD no to receiving blood. MD aware. Note place by MD in regards to blood administration. RN will continue to monitor.

## 2019-01-17 NOTE — Progress Notes (Signed)
   Subjective:    Patient ID: Anthony [name], male    DOB: 30-May-1940, 78 y.o.   MRN: [id number]  HPI Doing well.  No complaints.  Review of Systems     Objective:   Physical Exam Tm 102.1 P 67-107 R 12-22 BP 84/49-120/87 Alert, NAD Right neck/chest incision clean and intact.  Distal flap unchanged. Right facial weakness     Assessment & Plan:  Right neck cancer s/p parotidectomy, neck dissection, cervicopectoral flap; fever, anemia  Surgical site looks good.  Continue right eye care.  Latest fever spike thought to be reaction to iron infusion.  Blood pressure running fairly low with anemia.  Possible transfusion?  Appreciate hospitalist care.  Discharge planning possibly by end of week.

## 2019-01-17 NOTE — Progress Notes (Addendum)
Patient is hypotensive and Hb is 7. Needs blood transfusion. Could not reach to the legal guardian.  Initially he said ok to me . Nursing reported that he says no to transfusion when verifying.   Legal guardian could not be reached. Blood pressure responded to NS bolus. As he is not actively bleeding, will hold off on transfusion without consent. If further drop in BP and Hb , will sign emergency consent.

## 2019-01-18 LAB — PREPARE RBC (CROSSMATCH)

## 2019-01-18 LAB — CBC WITH DIFFERENTIAL/PLATELET
Abs Immature Granulocytes: 0.08 10*3/uL — ABNORMAL HIGH (ref 0.00–0.07)
Basophils Absolute: 0 10*3/uL (ref 0.0–0.1)
Basophils Relative: 0 %
Eosinophils Absolute: 0.3 10*3/uL (ref 0.0–0.5)
Eosinophils Relative: 4 %
HCT: 22.6 % — ABNORMAL LOW (ref 39.0–52.0)
Hemoglobin: 6.8 g/dL — CL (ref 13.0–17.0)
Immature Granulocytes: 1 %
Lymphocytes Relative: 6 %
Lymphs Abs: 0.5 10*3/uL — ABNORMAL LOW (ref 0.7–4.0)
MCH: 29.2 pg (ref 26.0–34.0)
MCHC: 30.1 g/dL (ref 30.0–36.0)
MCV: 97 fL (ref 80.0–100.0)
Monocytes Absolute: 0.8 10*3/uL (ref 0.1–1.0)
Monocytes Relative: 10 %
Neutro Abs: 5.9 10*3/uL (ref 1.7–7.7)
Neutrophils Relative %: 79 %
Platelets: 170 10*3/uL (ref 150–400)
RBC: 2.33 MIL/uL — ABNORMAL LOW (ref 4.22–5.81)
RDW: 15.3 % (ref 11.5–15.5)
WBC: 7.5 10*3/uL (ref 4.0–10.5)
nRBC: 0 % (ref 0.0–0.2)

## 2019-01-18 LAB — BASIC METABOLIC PANEL
Anion gap: 6 (ref 5–15)
BUN: 42 mg/dL — ABNORMAL HIGH (ref 8–23)
CO2: 19 mmol/L — ABNORMAL LOW (ref 22–32)
Calcium: 7.4 mg/dL — ABNORMAL LOW (ref 8.9–10.3)
Chloride: 110 mmol/L (ref 98–111)
Creatinine, Ser: 1.77 mg/dL — ABNORMAL HIGH (ref 0.61–1.24)
GFR calc Af Amer: 41 mL/min — ABNORMAL LOW (ref >60)
GFR calc non Af Amer: 35 mL/min — ABNORMAL LOW (ref >60)
Glucose, Bld: 153 mg/dL — ABNORMAL HIGH (ref 70–99)
Potassium: 4.6 mmol/L (ref 3.5–5.1)
Sodium: 135 mmol/L (ref 135–145)

## 2019-01-18 LAB — HEPATIC FUNCTION PANEL
ALT: 16 U/L (ref 0–44)
AST: 18 U/L (ref 15–41)
Albumin: 1.6 g/dL — ABNORMAL LOW (ref 3.5–5.0)
Alkaline Phosphatase: 64 U/L (ref 38–126)
Bilirubin, Direct: <0.1 mg/dL (ref 0.0–0.2)
Total Bilirubin: 0.4 mg/dL (ref 0.3–1.2)
Total Protein: 5.8 g/dL — ABNORMAL LOW (ref 6.5–8.1)

## 2019-01-18 LAB — PHOSPHORUS: Phosphorus: 2.2 mg/dL — ABNORMAL LOW (ref 2.5–4.6)

## 2019-01-18 LAB — MAGNESIUM: Magnesium: 1.9 mg/dL (ref 1.7–2.4)

## 2019-01-18 MED ORDER — SODIUM CHLORIDE 0.9% IV SOLUTION: Freq: Once | INTRAVENOUS | Status: DC

## 2019-01-18 MED ORDER — POTASSIUM & SODIUM PHOSPHATES 280-160-250 MG PO PACK
1.0000 | PACK | Freq: Three times a day (TID) | ORAL | Status: DC
  Administered 2019-01-18 – 2019-01-25 (×26): 1 via ORAL
  Filled 2019-01-18 (×30): qty 1

## 2019-01-18 MED ORDER — GABAPENTIN 250 MG/5ML PO SOLN
100.0000 mg | Freq: Three times a day (TID) | ORAL | Status: DC
  Administered 2019-01-18 – 2019-01-25 (×18): 100 mg
  Filled 2019-01-18 (×24): qty 2

## 2019-01-18 NOTE — Progress Notes (Addendum)
Spoke with patient's legal guardian Melissa. Lenna Sciara has concerns about patient going back to facility, and had hopes of patient getting his trach and PEG removed. This RN told Lenna Sciara that I was uncertain as to whether the removal of these drains were in the plan. This RN reached out to [facility] who requested I speak with the surgeon Dr. Redmond Baseman regarding this. This RN called Dr. Redmond Baseman office, requested Dr. Redmond Baseman give Lenna Sciara a call.  Will hold off on swab for the present moment  1505 Told to hold of on swab per [facility]

## 2019-01-18 NOTE — Progress Notes (Signed)
Nutrition Follow-up  DOCUMENTATION CODES:   Non-severe (moderate) malnutrition in context of chronic illness  INTERVENTION:  - continue Boost Breeze TID and to encourage PO intakes as tolerated.  - continue Osmolite 1.5 @ 50 ml/hr with 30 ml prostat BID. * weigh patient today.    NUTRITION DIAGNOSIS:   Moderate Malnutrition related to chronic illness(neck cancer, COPD, dementia) as evidenced by mild fat depletion, moderate fat depletion, moderate muscle depletion. -ongoing  GOAL:   Patient will meet greater than or equal to 90% of their needs -met with TF regimen and very minimal PO intake  MONITOR:   PO intake, Supplement acceptance, TF tolerance, Diet advancement, Skin, Weight trends, I & O's  ASSESSMENT:   78 year old male who presented on 10/29. PMH neck cancer, lower lip and scalp cancers, CKD stage III, COPD, dementia, dysphagia, GERD, HTN. S/p parotidectomy, radical neck dissection, cervical pectoral rotation flap on 10/29.  Patient has not been weighed since 10/29. He is a/o to self and place.   Patient continues on CLD with most recently documented intakes being 0% of breakfast and lunch on 10/30; 0% of lunch and dinner on 10/31; 0% of breakfast on 11/3. He has accepted Boost Breeze ~25% of the time; unsure how much was actually consumed on these occassions.   PEG remains in place and he is receiving Osmolite 1.5 @ 50 ml/hr with 30 ml prostat BID and 200 ml free water TID. This regimen is providing 2000 kcal, 105 grams protein, and 1514 ml free water.   Per notes: - post-op fever - stage 3 CKD - acute on chronic anemia of chronic disease - dementia - head and neck cancer s/p trach and PEG - deconditioning and impaired mobility     Labs reviewed; BUN: 42 mg/dl, creatinine: 1.77 mg/dl, Ca: 7.4 mg/dl, Phos: 2.2 mg/dl, GFR: 35 ml/min.  Medications reviewed; 6 mg melatonin/night, daily multivitamin with minerals, 40 mg oral protonix/day.    Diet Order:   Diet  Order            Diet clear liquid Room service appropriate? Yes; Fluid consistency: Thin  Diet effective now              EDUCATION NEEDS:   Not appropriate for education at this time  Skin:  Skin Assessment: Skin Integrity Issues: Skin Integrity Issues:: Incisions Incisions: closed incision to neck (10/29)  Last BM:  no documented BM  Height:   Ht Readings from Last 1 Encounters:  01/11/19 _0  (1.905 m)    Weight:   Wt Readings from Last 1 Encounters:  01/11/19 75.3 kg    Ideal Body Weight:  89.1 kg  BMI:  Body mass index is 20.75 kg/m.  Estimated Nutritional Needs:   Kcal:  2000-2200  Protein:  100-115 grams  Fluid:  >/= 1.8 L      Jarome Matin, MS, RD, LDN, [facility] Inpatient Clinical Dietitian Pager # [phone] After hours/weekend pager # [phone]

## 2019-01-18 NOTE — Progress Notes (Signed)
   Subjective:    Patient ID: Anthony [name], male    DOB: 1940-08-17, 78 y.o.   MRN: [id number]  HPI No complaints.  Review of Systems     Objective:   Physical Exam Tm 99.1 BP 82/33-107/61 Alert, NAD Right neck/chest incision with some dehiscence at superio-lateral extent.  Distal flap unchanged with dusky appearance. Right facial weakness WBC 7.5  Hgb 6.8     Assessment & Plan:  Right neck cancer s/p right parotidectomy, neck dissection, and cervicopectoral flap  Some wound dehiscence supero-laterally.  Will observe.  Deep margin of tumor resection changed from negative on frozen section to positive on permanent section.  Will discuss with his legal guardian about options to proceed with additional tissue resection or observe.  Cure is unlikely without post-operative radiation either way.  Will also suggest blood transfusion.

## 2019-01-18 NOTE — TOC Initial Note (Addendum)
Transition of Care [facility]) - Initial/Assessment Note    Patient Details  Name: Anthony [name] MRN: [id number] Date of Birth: 1940/10/23  Transition of Care [facility]) CM/SW Contact:    Alberteen Sam, [place] Phone Number: [phone] 01/18/2019, 12:49 PM  Clinical Narrative:                  CSW spoke with Betsey Amen to inform that CSW was notified in report this morning that patient will potentially be discharged back to [facility] soon. Melissa expressed concern regarding patient's current status and going back to SNF. She reports she's calling Dr. Redmond Baseman and stated that patient does not need tube feeds nor does he need trach and wanted them both taken out.   CSW has initiated insurance auth with [facility] in preparation of patient's return to SNF.  Pending insurance auth at this time.   CSW has requested COVID test be ordered.   Barriers to discharge include insurance auth and COVID test results once order is in.   Expected Discharge Plan: Skilled Nursing Facility Barriers to Discharge: Continued Medical Work up   Patient Goals and CMS Choice   CMS Medicare.gov Compare Post Acute Care list provided to:: Patient Represenative (must comment)(legal Guardian Melissa Price) Choice offered to / list presented to : [place] / Guardian  Expected Discharge Plan and Services Expected Discharge Plan: [place] Acute Care Choice: [place] Living arrangements for the past 2 months: [place]                                      Prior Living Arrangements/Services Living arrangements for the past 2 months: [place] Lives with:: Self Patient language and need for interpreter reviewed:: Yes Do you feel safe going back to the place where you live?: Yes      Need for Family Participation in Patient Care: Yes (Comment) Care giver support system in place?: Yes (comment)   Criminal Activity/Legal Involvement Pertinent  to Current Situation/Hospitalization: No - Comment as needed  Activities of Daily Living Home Assistive Devices/Equipment: Eyeglasses, Blood pressure cuff, Grab bars in shower, Shower chair with back, Hand-held shower hose, Wheelchair, Raised toilet seat with rails ADL Screening (condition at time of admission) Patient's cognitive ability adequate to safely complete daily activities?: Yes Is the patient deaf or have difficulty hearing?: No Does the patient have difficulty seeing, even when wearing glasses/contacts?: No Does the patient have difficulty concentrating, remembering, or making decisions?: Yes Patient able to express need for assistance with ADLs?: Yes Does the patient have difficulty dressing or bathing?: Yes Independently performs ADLs?: No Communication: Independent Dressing (OT): Needs assistance Is this a change from baseline?: Pre-admission baseline Grooming: Needs assistance Is this a change from baseline?: Pre-admission baseline Feeding: Independent Bathing: Needs assistance Is this a change from baseline?: Pre-admission baseline Toileting: Needs assistance Is this a change from baseline?: Pre-admission baseline In/Out Bed: Needs assistance Is this a change from baseline?: Pre-admission baseline Walks in Home: Needs assistance Is this a change from baseline?: Pre-admission baseline Does the patient have difficulty walking or climbing stairs?: Yes Weakness of Legs: Both Weakness of Arms/Hands: Right  Permission Sought/Granted Permission sought to share information with : Case Manager, Customer service manager, Family Supports Permission granted to share information with : Yes, Verbal Permission Granted  Share Information with NAME: Melissa  Permission  granted to share info w AGENCY: SNFs  Permission granted to share info w Relationship: legal guardian  Permission granted to share info w Contact Information: [phone] extension [phone]  Emotional  Assessment Appearance:: Appears stated age Attitude/Demeanor/Rapport: Unable to Assess Affect (typically observed): Unable to Assess Orientation: : Oriented to Self Alcohol / Substance Use: Not Applicable Psych Involvement: No (comment)  Admission diagnosis:  right neck mass Patient Active Problem List   Diagnosis Date Noted  . Depression 01/14/2019  . Suprapubic catheter ([place]) 01/14/2019  . Restless leg syndrome 01/14/2019  . Malnutrition of moderate degree 01/13/2019  . Head and neck cancer ([place]) 01/11/2019  . 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 Squamous cell carcinoma of scalp 05/06/2018  . Protein-calorie malnutrition, severe 04/13/2018  . Tracheostomy in place [facility] 04/12/2018  . Cancer of lower lip 04/12/2018  . Squamous cell carcinoma of lip 03/21/2018  . Cellulitis 11/07/2017  . Syncope and collapse 10/10/2017  . Acute lower UTI 10/10/2017  . AKI (acute kidney injury) ([place])   . Hematemesis 07/06/2017  . Normochromic normocytic anemia 06/19/2017  . CKD (chronic kidney disease) stage 3, GFR 30-59 ml/min 06/19/2017  . Dementia ([place]) 06/19/2017  . Decubitus ulcer 06/19/2017  . Open wnd of scalp   . S/p left hip fracture   . HCAP (healthcare-associated pneumonia) 10/31/2016  . CKD (chronic kidney disease) 10/31/2016  . COPD (chronic obstructive pulmonary disease) ([place]) 10/31/2016  . Hyponatremia 12/25/2014  . Hypokalemia 12/25/2014  . Anemia 12/25/2014  . Pulmonary nodules 12/25/2014  . Tobacco abuse 12/25/2014  . ARF (acute renal failure) ([place]) 12/24/2014  . Acute pyelonephritis 12/23/2014   PCP:  Pablo Ledger, MD Pharmacy:   [facility] [place] [facility] [place] [address] [place] [address] Phone: [phone] Fax: [phone]     Social Determinants of Health (SDOH) Interventions    Readmission Risk Interventions No flowsheet data found.

## 2019-01-18 NOTE — Progress Notes (Signed)
PROGRESS NOTE    Anthony [name]  [id number] DOB: April 17, 1940 DOA: 01/11/2019 PCP: Pablo Ledger, MD    Brief Narrative:  Anthony [name] is an 78 y.o. 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  Patient developed fever 103 with leukocytosis on day 2 surgery.  Medicine consulted.   Assessment & Plan:   Active Problems:   Anemia   Tobacco abuse   COPD (chronic obstructive pulmonary disease) (HCC)   CKD (chronic kidney disease) stage 3, GFR 30-59 ml/min   Dementia (HCC)   Tracheostomy in place (HCC)   Head and neck cancer (HCC)   Malnutrition of moderate degree   Depression   Suprapubic catheter (HCC)   Restless leg syndrome  Postoperative fever: Likely UTI due to indwelling suprapubic catheter.  Patient did have tenderness and thick secretions  around the suprapubic tube.Blood cultures are negative so far.Urine cultures with multiple organisms.  Exchanged suprapubic catheter, reculture urine after new catheter placement pending.  Repeat cultures were sent after starting on antibiotics, may not grow any bacteria.  We will continue cefepime today.  Vancomycin was discontinued. If no positive data, discontinue all antibiotics for tomorrow.  Chronic kidney disease stage III: On maintenance IV fluids.  Stable around at baseline.  Acute on chronic anemia of chronic disease: Hemoglobin continue to downtrend.  Less than 7.   Were able to get consent from legal guardian.  1 unit PRBC transfusion today.   Iron transfusion attempted, developed high fever on test dose.  Discontinued.    Dementia, deconditioning and impaired  mobility: Has tracheostomy and functioning well.  Continue aggressive chest physiotherapy.  Chest x-ray was normal. PEG tube infusing well.  Patient also allowed clear liquid diet. Other chronic medical issues including dementia, restless leg syndrome remained stable.  Recurrent head and neck cancer status post resection: He has some wound dehiscence.  Surgery reported positive margins.  Unsure whether he is a candidate for further resection.  As per surgery.  We will continue to follow with you.  DVT prophylaxis: SCDs Code Status: Full code Family Communication: None Disposition Plan: Anticipate discharge back to nursing home when stable from surgical standpoint and afebrile.   Procedures:   Radical neck dissection and flap.  Antimicrobials:   Vancomycin, 01/14/2019-01/16/2019   cefepime, 01/14/2019----   Subjective: Patient seen and examined.  Blood pressures are adequate today.  Patient himself has no complaints. Afebrile last 24 hours. He screams on touching everywhere.   Objective: Vitals:   01/18/19 0700 01/18/19 0900 01/18/19 1000 01/18/19 1119  BP: (!) 101/52 115/60 (!) 109/53   Pulse: 64 82 73 72  Resp: (!) 22 16 (!) 21 19  Temp:  (!) 97.4 F (36.3 C) 98.9 F (37.2 C)   TempSrc:  Oral Axillary   SpO2: 100% 100% 100% 98%  Weight:      Height:        Intake/Output Summary (Last 24 hours) at 01/18/2019 1124 Last data filed at 01/18/2019 0643 Gross per 24 hour  Intake 220 ml  Output 2775 ml  Net -2555 ml   Filed Weights   01/11/19 0717  Weight: 75.3 kg    Examination:  General exam: Appears calm and  comfortable, chronically sick looking. Right neck surgical scar has some dehiscence along the clavicle margins.  Incision area is with some redness, no obvious fluctuation.  No obvious drainage. Respiratory system: Clear to auscultation. Respiratory effort normal.  Mostly conducted airway sounds, On trach collar. Right neck incision clean , dry.  Cardiovascular system: S1 & S2 heard, RRR. No JVD, murmurs, rubs, gallops or clicks. No pedal edema. Gastrointestinal system: Abdomen is nondistended, soft.  PEG tube intact and feeding infusing.  No organomegaly or masses felt. Normal bowel sounds heard. PEG tube anchor has come off but is in place.  Functional. Central nervous system: Alert and oriented.  Unsure about orientation.  Follows commands.  Tries to engage in conversation. Able to move all extremities. Extremities: Symmetric 5 x 5 power. Skin: No rashes, lesions or ulcers Psychiatry: Judgement and insight appear normal. Mood & affect appropriate.     Data Reviewed: I have personally reviewed following labs and imaging studies  CBC: Recent Labs  Lab 01/14/19 1137 01/15/19 0233 01/16/19 0214 01/17/19 1355 01/18/19 0214  WBC 12.1* 6.5 7.6 10.9* 7.5  NEUTROABS 10.5* 4.8 5.0 9.2* 5.9  HGB 8.0* 7.5* 7.8* 7.0* 6.8*  HCT 26.3* 24.1* 25.2* 23.1* 22.6*  MCV 96.3 95.3 97.3 98.7 97.0  PLT 277 228 217 179 [id number]   Basic Metabolic Panel: Recent Labs  Lab 01/13/19 0959 01/14/19 1137 01/15/19 0233 01/16/19 0214 01/18/19 0214  NA  --  137 136 137 135  K  --  4.0 4.4 4.2 4.6  CL  --  109 110 110 110  CO2  --  20* 20* 20* 19*  GLUCOSE  --  151* 176* 148* 153*  BUN  --  29* 29* 28* 42*  CREATININE 1.80* 1.74* 1.66* 1.45* 1.77*  CALCIUM  --  8.0* 7.6* 7.8* 7.4*  MG  --   --   --  2.1 1.9  PHOS  --   --   --  3.1 2.2*   GFR: Estimated Creatinine Clearance: 35.5 mL/min (A) (by C-G formula based on SCr of 1.77 mg/dL (H)). Liver Function Tests: Recent Labs  Lab 01/18/19 0214  AST 18  ALT 16  ALKPHOS 64  BILITOT 0.4  PROT 5.8*  ALBUMIN 1.6*   No results for input(s): LIPASE, AMYLASE in the last 168 hours. No results for input(s): AMMONIA in the last 168 hours. Coagulation Profile: No results for input(s): INR, PROTIME in the last 168 hours. Cardiac Enzymes: No results for input(s): CKTOTAL, CKMB, CKMBINDEX, TROPONINI  in the last 168 hours. BNP (last 3 results) No results for input(s): PROBNP in the last 8760 hours. HbA1C: No results for input(s): HGBA1C in the last 72 hours. CBG: Recent Labs  Lab 01/11/19 2058 01/16/19 1944  GLUCAP 149* 127*   Lipid Profile: No results for input(s): CHOL, HDL, LDLCALC, TRIG, CHOLHDL, LDLDIRECT in the last 72 hours. Thyroid Function Tests: No results for input(s): TSH, T4TOTAL, FREET4, T3FREE, THYROIDAB in the last 72 hours. Anemia Panel: No results for input(s): VITAMINB12, FOLATE, FERRITIN, TIBC, IRON, RETICCTPCT in the last 72 hours. Sepsis Labs: Recent Labs  Lab 01/14/19 1137 01/14/19 1514 01/15/19 0233 01/16/19 0214 01/17/19 1617  PROCALCITON 0.26  --  0.28 0.21  --   LATICACIDVEN 1.6 1.2  --   --  1.0    Recent Results (from the past 240 hour(s))  SARS CORONAVIRUS 2 (TAT 6-24 HRS) Nasopharyngeal Nasopharyngeal Swab     Status: None   Collection Time: 01/11/19  5:30 PM  Specimen: Nasopharyngeal Swab  Result Value Ref Range Status   SARS Coronavirus 2 NEGATIVE NEGATIVE Final    Comment: (NOTE) SARS-CoV-2 target nucleic acids are NOT DETECTED. The SARS-CoV-2 RNA is generally detectable in upper and lower respiratory specimens during the acute phase of infection. Negative results do not preclude SARS-CoV-2 infection, do not rule out co-infections with other pathogens, and should not be used as the sole basis for treatment or other patient management decisions. Negative results must be combined with clinical observations, patient history, and epidemiological information. The expected result is Negative. Fact Sheet for Patients: SugarRoll.be Fact Sheet for Healthcare Providers: https://www.woods-mathews.com/ This test is not yet approved or cleared by the [place] FDA and  has been authorized for detection and/or diagnosis of SARS-CoV-2 by FDA under an Emergency Use Authorization (EUA). This EUA will  remain  in effect (meaning this test can be used) for the duration of the COVID-19 declaration under Section 56 4(b)(1) of the Act, 21 U.S.C. section 360bbb-3(b)(1), unless the authorization is terminated or revoked sooner. Performed at [facility], [place] [address] [place], [place] [address]   MRSA PCR Screening     Status: Abnormal   Collection Time: 01/12/19 12:54 AM   Specimen: Nasopharyngeal  Result Value Ref Range Status   MRSA by PCR POSITIVE (A) NEGATIVE Final    Comment:        The GeneXpert MRSA Assay (FDA approved for NASAL specimens only), is one component of a comprehensive MRSA colonization surveillance program. It is not intended to diagnose MRSA infection nor to guide or monitor treatment for MRSA infections. RESULT CALLED TO, READ BACK BY AND VERIFIED WITH: NEWCOMER,S RN 01/12/2019 AT [id number] SKEEN,P Performed at [facility], [place] [address], [place], [place] [address]   Culture, Urine     Status: Abnormal   Collection Time: 01/14/19  2:00 PM   Specimen: Urine, Catheterized  Result Value Ref Range Status   Specimen Description URINE, CATHETERIZED  Final   Special Requests   Final    NONE Performed at [facility], [place] [address] [place], [place] [address]    Culture MULTIPLE SPECIES PRESENT, SUGGEST RECOLLECTION (A)  Final   Report Status 01/15/2019 FINAL  Final  Culture, blood (routine x 2)     Status: None (Preliminary result)   Collection Time: 01/14/19  3:44 PM   Specimen: BLOOD RIGHT HAND  Result Value Ref Range Status   Specimen Description BLOOD RIGHT HAND  Final   Special Requests   Final    BOTTLES DRAWN AEROBIC ONLY Blood Culture adequate volume   Culture   Final    NO GROWTH 3 DAYS Performed at [facility], [place] [address] [place], [place] [address]    Report Status PENDING  Incomplete  Culture, blood (routine x 2)     Status: None (Preliminary result)   Collection Time: 01/14/19  3:44 PM   Specimen: BLOOD RIGHT  ARM  Result Value Ref Range Status   Specimen Description BLOOD RIGHT ARM  Final   Special Requests   Final    BOTTLES DRAWN AEROBIC AND ANAEROBIC Blood Culture results may not be optimal due to an excessive volume of blood received in culture bottles   Culture   Final    NO GROWTH 3 DAYS Performed at [facility], [place] [address] [place], [place] [address]    Report Status PENDING  Incomplete  Culture, Urine     Status:  None   Collection Time: 01/15/19 11:51 AM   Specimen: Urine, Suprapubic  Result Value Ref Range Status   Specimen Description URINE, SUPRAPUBIC  Final   Special Requests NONE  Final   Culture   Final    NO GROWTH Performed at [facility], [address] [place],  [address]    Report Status 01/16/2019 FINAL  Final         Radiology Studies: No results found.      Scheduled Meds: . sodium chloride   Intravenous Once  . artificial tears   Right Eye QHS  . bacitracin  1 application Topical [id number]  . chlorhexidine  15 mL Mouth Rinse BID  . feeding supplement  1 Container Oral TID BM  . feeding supplement (PRO-STAT SUGAR FREE 64)  30 mL Per Tube BID  . free water  200 mL Per Tube Q8H  . gabapentin  100 mg Oral TID  . mouth rinse  15 mL Mouth Rinse q12n4p  . Melatonin  6 mg Oral QHS  . memantine  5 mg Oral Daily  . multivitamin with minerals  1 tablet Oral Daily  . pantoprazole  40 mg Oral Daily  . pramipexole  0.125 mg Oral QHS  . sertraline  50 mg Oral Daily   Continuous Infusions: . ceFEPime (MAXIPIME) IV 2 g (01/18/19 1041)  . dextrose 5 % and 0.45 % NaCl with KCl 20 mEq/L 75 mL/hr at 01/17/19 1641  . feeding supplement (OSMOLITE 1.5 CAL) 1,000 mL (01/18/19 1040)     LOS: 7 days    Time spent: 30 minutes    Barb Merino, MD [facility] Pager [phone]

## 2019-01-18 NOTE — Progress Notes (Signed)
PEG tube sutures appear to have dissolved/come apart. Hospitalist viewed site, OK with running feeds. Redressed

## 2019-01-18 NOTE — NC FL2 (Signed)
[place] MEDICAID FL2 LEVEL OF CARE SCREENING TOOL     IDENTIFICATION  Patient Name: Anthony [name] Birthdate: 1940-04-09 Sex: male Admission Date (Current Location): 01/11/2019  [facility] and [place] Number:  Herbalist and Address:  [facility], [place] [address], [place], [place] [address]      Provider Number: [id number]  Attending Physician Name and Address:  Melida Quitter, MD  Relative Name and Phone Number:  Betsey Amen [phone]    Current Level of Care: Hospital Recommended Level of Care: [place] Prior Approval Number:    Date Approved/Denied:   PASRR Number: [id number]  Discharge Plan: SNF    Current Diagnoses: Patient Active Problem List   Diagnosis Date Noted  . Depression 01/14/2019  . Suprapubic catheter ([place]) 01/14/2019  . Restless leg syndrome 01/14/2019  . Malnutrition of moderate degree 01/13/2019  . Head and neck cancer ([place]) 01/11/2019  . 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 Squamous cell carcinoma of scalp 05/06/2018  . Protein-calorie malnutrition, severe 04/13/2018  . Tracheostomy in place [facility]) 04/12/2018  . Cancer of lower lip 04/12/2018  . Squamous cell carcinoma of lip 03/21/2018  . Cellulitis 11/07/2017  . Syncope and collapse 10/10/2017  . Acute lower UTI 10/10/2017  . AKI (acute kidney injury) ([place])   . Hematemesis 07/06/2017  . Normochromic normocytic anemia 06/19/2017  . CKD (chronic kidney disease) stage 3, GFR 30-59 ml/min 06/19/2017  . Dementia ([place]) 06/19/2017  . Decubitus ulcer 06/19/2017  . Open wnd of scalp   . S/p left hip fracture   . HCAP (healthcare-associated pneumonia) 10/31/2016  . CKD (chronic kidney disease) 10/31/2016  . COPD (chronic obstructive pulmonary disease) ([place]) 10/31/2016  . Hyponatremia 12/25/2014  . Hypokalemia 12/25/2014  . Anemia 12/25/2014  . Pulmonary nodules 12/25/2014  . Tobacco abuse 12/25/2014  . ARF (acute renal failure) ([place])  12/24/2014  . Acute pyelonephritis 12/23/2014    Orientation RESPIRATION BLADDER Height & Weight     Self, Time, Situation  Tracheostomy(trach collar 5L O2, FiO2 28%) Continent, External catheter Weight: 166 lb (75.3 kg) Height:  6\' 3"  (190.5 cm)  BEHAVIORAL SYMPTOMS/MOOD NEUROLOGICAL BOWEL NUTRITION STATUS      Incontinent Diet(see discharge summary)  AMBULATORY STATUS COMMUNICATION OF NEEDS Skin   Supervision Verbally(trach now) Other (Comment)(neck closed surgical incision)                       Personal Care Assistance Level of Assistance  Bathing, Feeding, Dressing Bathing Assistance: Limited assistance Feeding assistance: Independent Dressing Assistance: Limited assistance     Functional Limitations Info  Sight, Hearing, Speech Sight Info: Adequate Hearing Info: Adequate Speech Info: Adequate    SPECIAL CARE FACTORS FREQUENCY  PT (By licensed PT), OT (By licensed OT)     PT Frequency: min 5x weekly OT Frequency: min 5x weekly            Contractures Contractures Info: Not present    Additional Factors Info  Code Status, Allergies Code Status Info: full Allergies Info: Bee Venom, Penicillins, Strawberry extract           Current Medications (01/18/2019):  This is the current hospital active medication list Current Facility-Administered Medications  Medication Dose Route Frequency Provider Last Rate Last Dose  . 0.9 %  sodium chloride infusion (Manually program via Guardrails IV Fluids)   Intravenous Once Hollace Hayward K, NP      . acetaminophen (TYLENOL) suppository 650 mg  650 mg Rectal Q4H PRN Melida Quitter, MD   650 mg at 01/16/19 1544  . artificial tears (LACRILUBE) ophthalmic ointment   Right Eye Lamonte Richer, MD      . bacitracin ointment 1 application  1 application Topical [id number] Melida Quitter, MD   1 application at [id number] [id number]  . ceFEPIme (MAXIPIME) 2 g in sodium chloride 0.9 % 100 mL IVPB  2 g Intravenous Q12H Vertis Kelch L, RPH 200  mL/hr at 01/18/19 1041 2 g at 01/18/19 1041  . chlorhexidine (PERIDEX) 0.12 % solution 15 mL  15 mL Mouth Rinse BID Melida Quitter, MD   15 mL at 01/16/19 2222  . dextrose 5 % and 0.45 % NaCl with KCl 20 mEq/L infusion   Intravenous Continuous Melida Quitter, MD 75 mL/hr at 01/17/19 1641    . feeding supplement (BOOST / RESOURCE BREEZE) liquid 1 Container  1 Container Oral TID BM Melida Quitter, MD   1 Container at 01/17/19 2304  . feeding supplement (OSMOLITE 1.5 CAL) liquid 1,000 mL  1,000 mL Per Tube Continuous Melida Quitter, MD 50 mL/hr at 01/18/19 1040 1,000 mL at 01/18/19 1040  . feeding supplement (PRO-STAT SUGAR FREE 64) liquid 30 mL  30 mL Per Tube BID Melida Quitter, MD   30 mL at 01/18/19 1012  . free water 200 mL  200 mL Per Tube Q8H Melida Quitter, MD   200 mL at 01/18/19 0449  . gabapentin (NEURONTIN) capsule 100 mg  100 mg Oral TID Melida Quitter, MD   100 mg at 01/18/19 1012  . Gerhardt's butt cream   Topical PRN Barb Merino, MD      . MEDLINE mouth rinse  15 mL Mouth Rinse q12n4p Melida Quitter, MD      . Melatonin TABS 6 mg  6 mg Oral QHS Melida Quitter, MD   6 mg at 01/17/19 2255  . memantine (NAMENDA) tablet 5 mg  5 mg Oral Daily Melida Quitter, MD   5 mg at 01/18/19 1012  . morphine 2 MG/ML injection 2-4 mg  2-4 mg Intravenous Q2H PRN Melida Quitter, MD   2 mg at 01/14/19 0415  . multivitamin with minerals tablet 1 tablet  1 tablet Oral Daily Melida Quitter, MD   1 tablet at 01/18/19 1012  . oxyCODONE-acetaminophen (PERCOCET/ROXICET) 5-325 MG per tablet 1-2 tablet  1-2 tablet Oral Q4H PRN Melida Quitter, MD   1 tablet at 01/18/19 1013  . pantoprazole (PROTONIX) EC tablet 40 mg  40 mg Oral Daily Melida Quitter, MD   40 mg at 01/18/19 1012  . polyethylene glycol (MIRALAX / GLYCOLAX) packet 17 g  17 g Oral Daily PRN Melida Quitter, MD      . polyvinyl alcohol (LIQUIFILM TEARS) 1.4 % ophthalmic solution 1 drop  1 drop Right Eye PRN Melida Quitter, MD      . potassium & sodium phosphates  (PHOS-NAK) 280-160-250 MG packet 1 packet  1 packet Oral TID WC & HS Barb Merino, MD      . pramipexole (MIRAPEX) tablet 0.125 mg  0.125 mg Oral QHS Melida Quitter, MD   0.125 mg at 01/17/19 2256  . sertraline (ZOLOFT) tablet 50 mg  50 mg Oral Daily Melida Quitter, MD   50 mg at 01/18/19 1012     Discharge Medications: Please see discharge summary for a list of discharge medications.  Relevant Imaging Results:  Relevant Lab Results:   Additional Information SSN: [id number]  Alberteen Sam, LCSW

## 2019-01-18 NOTE — Progress Notes (Signed)
Occupational Therapy Treatment Patient Details Name: Anthony [name] MRN: [id number] DOB: Aug 19, 1940 Today's Date: 01/18/2019    History of present illness [name] is an 78 y.o. male with squamous cell carcinoma of right neck s/p radical neck dissection, parotidectomy and cervical pectoral flap. PMH includes CKD stage III, COPD, dementia, depression, chronic bladder outlet obstruction and urinary retention, BPH s/p suprapubic catheter, restless leg syndrome, chronic trach, PEG tube admitted for radical neck dissection secondary to underlying squamous cell carcinoma of right neck.   OT comments  Pt making gradual progress towards OT goals, tolerating OOB to recliner during this session. Pt requiring +2 max-totalA for completion of mobility tasks this session, requires increased time/effort due to general weakness, pain, and deconditioning. He was able to perform simple grooming ADL seated EOB with minA, but continues to require up to [place] for LB ADL tasks. VSS throughout today. Recommend continue per POC.   Follow Up Recommendations  SNF;Supervision/Assistance - 24 hour    Equipment Recommendations  Other (comment)(TBD at next venue of care)    Recommendations for Other Services      Precautions / Restrictions Precautions Precautions: Fall Precaution Comments: trach collar 28% 5L 02, JP drain, PEG tube Restrictions Weight Bearing Restrictions: No       Mobility Bed Mobility Overal bed mobility: Needs Assistance Bed Mobility: Supine to Sit     Supine to sit: Max assist;+2 for physical assistance;+2 for safety/equipment     General bed mobility comments: assist for LEs and trunk elevation, use of bed pad to support trunk upright to decrease pull on RUE  Transfers Overall transfer level: Needs assistance Equipment used: 2 person hand held assist Transfers: Sit to/from [facility] Sit to Stand: Max assist;+2 physical assistance;+2 safety/equipment Stand  pivot transfers: Max assist;+2 physical assistance;+2 safety/equipment;Total assist       General transfer comment: use of bed pad to support hips and face to face method; pt tolerating x2 standing trials and during third trial able to pivot hips towards drop arm recliner with +2 assist, gentle support provided at RUE    Balance Overall balance assessment: Needs assistance Sitting-balance support: Feet supported;No upper extremity supported Sitting balance-Leahy Scale: Fair Sitting balance - Comments: Able to sit statically with Min A-Min guard assist, dizziness.                                   ADL either performed or assessed with clinical judgement   ADL Overall ADL's : Needs assistance/impaired     Grooming: Wash/dry face;Minimal assistance;Sitting Grooming Details (indicate cue type and reason): assist to support UE, pt using RUE              Lower Body Dressing: Total assistance Lower Body Dressing Details (indicate cue type and reason): donning socks at bed level             Functional mobility during ADLs: Maximal assistance;+2 for physical assistance;+2 for safety/equipment(HHA, stand pivot transfer) General ADL Comments: pt agreeable to OOB to recliner during this session, tolerating well with mild increase in pain                       Cognition Arousal/Alertness: Awake/alert Behavior During Therapy: WFL for tasks assessed/performed;Flat affect Overall Cognitive Status: Impaired/Different from baseline Area of Impairment: Following commands;Awareness;Problem solving  Following Commands: Follows one step commands with increased time   Awareness: Intellectual Problem Solving: Requires verbal cues;Difficulty sequencing;Decreased initiation;Slow processing General Comments: overall following basic commands, appears HOH and pt on trach collar so difficult to fully assess         Exercises     Shoulder  Instructions       General Comments      Pertinent Vitals/ Pain       Pain Assessment: Faces Faces Pain Scale: Hurts even more Pain Location: right side of neck with mobility Pain Descriptors / Indicators: Sore;Grimacing;Guarding;Discomfort;Operative site guarding Pain Intervention(s): Limited activity within patient's tolerance;Monitored during session;Repositioned  Home Living                                          Prior Functioning/Environment              Frequency  Min 2X/week        Progress Toward Goals  OT Goals(current goals can now be found in the care plan section)  Progress towards OT goals: Progressing toward goals  Acute Rehab OT Goals Patient Stated Goal: agreeable to OOB today OT Goal Formulation: With patient Time For Goal Achievement: 01/29/19 Potential to Achieve Goals: Good ADL Goals Pt Will Perform Grooming: with min assist;sitting Pt Will Perform Upper Body Bathing: with min assist;sitting Pt Will Perform Lower Body Dressing: with mod assist;sit to/from stand Pt Will Transfer to Toilet: with mod assist;with +2 assist;bedside commode;ambulating Pt/caregiver will Perform Home Exercise Program: Increased ROM;Right Upper extremity  Plan Discharge plan remains appropriate    Co-evaluation    PT/OT/SLP Co-Evaluation/Treatment: Yes Reason for Co-Treatment: Complexity of the patient's impairments (multi-system involvement);For patient/therapist safety;To address functional/ADL transfers   OT goals addressed during session: ADL's and self-care      AM-PAC OT "6 Clicks" Daily Activity     Outcome Measure   Help from another person eating meals?: Total Help from another person taking care of personal grooming?: A Lot Help from another person toileting, which includes using toliet, bedpan, or urinal?: Total Help from another person bathing (including washing, rinsing, drying)?: A Lot Help from another person to put on and  taking off regular upper body clothing?: A Lot Help from another person to put on and taking off regular lower body clothing?: Total 6 Click Score: 9    End of Session Equipment Utilized During Treatment: Oxygen(via trach collar)  OT Visit Diagnosis: Other abnormalities of gait and mobility (R26.89);Muscle weakness (generalized) (M62.81);Pain;Other symptoms and signs involving cognitive function Pain - Right/Left: Right Pain - part of body: Shoulder(neck)   Activity Tolerance Patient tolerated treatment well   Patient Left with call bell/phone within reach;in chair;with chair alarm set   Nurse Communication Mobility status;Precautions        Time: [id number] OT Time Calculation (min): 42 min  Charges: OT General Charges $OT Visit: 1 Visit OT Treatments $Self Care/Home Management : 8-22 mins $Therapeutic Activity: 8-22 mins  Lou Cal, OT Supplemental Rehabilitation Services Pager [phone] Office [phone]   Raymondo Band 01/18/2019, 2:57 PM

## 2019-01-18 NOTE — Progress Notes (Signed)
Physical Therapy Treatment Patient Details Name: Anthony [name] MRN: [id number] DOB: 1940-06-15 Today's Date: 01/18/2019    History of Present Illness Anthony [name] is an 78 y.o. male with squamous cell carcinoma of right neck s/p radical neck dissection, parotidectomy and cervical pectoral flap. PMH includes CKD stage III, COPD, dementia, depression, chronic bladder outlet obstruction and urinary retention, BPH s/p suprapubic catheter, restless leg syndrome, chronic trach, PEG tube admitted for radical neck dissection secondary to underlying squamous cell carcinoma of right neck.    PT Comments    Patient seen for mobility progression. Pt agreeable to OOB mobility this pm. Pt requires +2 assist for bed mobility and functional transfer training. Continue to progress as tolerated with anticipated d/c to SNF for further skilled PT services.     Follow Up Recommendations  SNF;Supervision for mobility/OOB     Equipment Recommendations  None recommended by PT    Recommendations for Other Services       Precautions / Restrictions Precautions Precautions: Fall Precaution Comments: trach collar 28% 5L 02, JP drain, PEG tube Restrictions Weight Bearing Restrictions: No    Mobility  Bed Mobility Overal bed mobility: Needs Assistance Bed Mobility: Supine to Sit     Supine to sit: Max assist;+2 for physical assistance;+2 for safety/equipment     General bed mobility comments: assist for LEs and trunk elevation, use of bed pad to support trunk upright to decrease pull on RUE  Transfers Overall transfer level: Needs assistance Equipment used: 2 person hand held assist Transfers: Sit to/from [facility] Sit to Stand: Max assist;+2 physical assistance;+2 safety/equipment Stand pivot transfers: Max assist;+2 physical assistance;+2 safety/equipment;Total assist       General transfer comment: use of bed pad to support hips and face to face method; pt tolerating x2  standing trials and during third trial able to pivot hips towards drop arm recliner with +2 assist, gentle support provided at RUE  Ambulation/Gait                 Stairs             Wheelchair Mobility    Modified Rankin (Stroke Patients Only)       Balance Overall balance assessment: Needs assistance Sitting-balance support: Feet supported;No upper extremity supported Sitting balance-Leahy Scale: Fair Sitting balance - Comments: Able to sit statically with Min A-Min guard assist, dizziness.                                    Cognition Arousal/Alertness: Awake/alert Behavior During Therapy: WFL for tasks assessed/performed;Flat affect Overall Cognitive Status: Impaired/Different from baseline Area of Impairment: Following commands;Awareness;Problem solving                       Following Commands: Follows one step commands with increased time   Awareness: Intellectual Problem Solving: Requires verbal cues;Difficulty sequencing;Decreased initiation;Slow processing General Comments: overall following basic commands, appears HOH and pt on trach collar so difficult to fully assess       Exercises      General Comments        Pertinent Vitals/Pain Pain Assessment: Faces Faces Pain Scale: Hurts even more Pain Location: right side of neck with mobility Pain Descriptors / Indicators: Sore;Grimacing;Guarding;Discomfort;Operative site guarding Pain Intervention(s): Limited activity within patient's tolerance;Monitored during session;Repositioned    Home Living  Prior Function            PT Goals (current goals can now be found in the care plan section) Acute Rehab PT Goals Patient Stated Goal: agreeable to OOB today Progress towards PT goals: Progressing toward goals    Frequency    Min 2X/week      PT Plan Current plan remains appropriate    Co-evaluation PT/OT/SLP  Co-Evaluation/Treatment: Yes Reason for Co-Treatment: Complexity of the patient's impairments (multi-system involvement);For patient/therapist safety;To address functional/ADL transfers PT goals addressed during session: Mobility/safety with mobility OT goals addressed during session: ADL's and self-care      AM-PAC PT "6 Clicks" Mobility   Outcome Measure  Help needed turning from your back to your side while in a flat bed without using bedrails?: A Lot Help needed moving from lying on your back to sitting on the side of a flat bed without using bedrails?: A Lot Help needed moving to and from a bed to a chair (including a wheelchair)?: A Lot Help needed standing up from a chair using your arms (e.g., wheelchair or bedside chair)?: A Lot Help needed to walk in hospital room?: Total Help needed climbing 3-5 steps with a railing? : Total 6 Click Score: 10    End of Session Equipment Utilized During Treatment: Oxygen Activity Tolerance: Patient tolerated treatment well Patient left: in chair;with call bell/phone within reach Nurse Communication: Mobility status PT Visit Diagnosis: Pain;Muscle weakness (generalized) (M62.81);Dizziness and giddiness (R42) Pain - Right/Left: Right     Time: 1326-1405 PT Time Calculation (min) (ACUTE ONLY): 39 min  Charges:  $Gait Training: 8-22 mins                     Earney Navy, PTA Acute Rehabilitation Services Pager: [phone] Office: [phone]     Darliss Cheney 01/18/2019, 4:59 PM

## 2019-01-18 NOTE — Progress Notes (Signed)
PT Cancellation Note  Patient Details Name: Anthony [name] MRN: [id number] DOB: [birth year]   Cancelled Treatment:    Reason Eval/Treat Not Completed: Patient declined, no reason specified;Other (comment)(Hgb 6.8) Attempted to see pt for PT tx. Pt with low Hgb and awaiting PRBC and pt declined participating at this time. PT will continue to follow acutely.    Earney Navy, PTA Acute Rehabilitation Services Pager: [phone] Office: [phone]   01/18/2019, 10:19 AM

## 2019-01-18 NOTE — Progress Notes (Signed)
   Subjective:    Patient ID: Anthony [name], male    DOB: 12-04-1940, 78 y.o.   MRN: [id number]  HPI    Review of Systems     Objective:   Physical Exam        Assessment & Plan:  I discussed the positive margin with his legal guardian, Melissa.  We discussed options including observation, radiation treatments, or further resection for clearing the margin.  The last option would be the most oncologically appropriate.  She, in talking with her treatment team, wishes to proceed with a chest CT prior to deciding to make sure there is not obvious evidence of distant metastasis.  We will discuss results tomorrow before making further plans to possibly revise surgery on Saturday.

## 2019-01-18 NOTE — Progress Notes (Signed)
This RN has tried multiple times to brush patient's teeth/mouth. Managed to swab with CHG and suction quickly, but patient would not allow me to continue. Will continue to attempt

## 2019-01-18 NOTE — Progress Notes (Signed)
CSW received call from [facility] saying patient not managed by them although they did receive clinicals. SNF to start auth with regular [facility]. [facility] at [facility] updated and starting [place].   [place], [place]

## 2019-01-18 NOTE — Progress Notes (Signed)
Attempted to contact guardian Melissa for blood consent, no response. MD aware

## 2019-01-19 ENCOUNTER — Inpatient Hospital Stay (HOSPITAL_COMMUNITY): Payer: Medicare Other

## 2019-01-19 LAB — TYPE AND SCREEN
ABO/RH(D): O POS
Antibody Screen: NEGATIVE
Unit division: 0

## 2019-01-19 LAB — MAGNESIUM: Magnesium: 2 mg/dL (ref 1.7–2.4)

## 2019-01-19 LAB — CBC WITH DIFFERENTIAL/PLATELET
Abs Immature Granulocytes: 0.12 10*3/uL — ABNORMAL HIGH (ref 0.00–0.07)
Basophils Absolute: 0 10*3/uL (ref 0.0–0.1)
Basophils Relative: 1 %
Eosinophils Absolute: 0.4 10*3/uL (ref 0.0–0.5)
Eosinophils Relative: 6 %
HCT: 24.1 % — ABNORMAL LOW (ref 39.0–52.0)
Hemoglobin: 7.7 g/dL — ABNORMAL LOW (ref 13.0–17.0)
Immature Granulocytes: 2 %
Lymphocytes Relative: 12 %
Lymphs Abs: 0.7 10*3/uL (ref 0.7–4.0)
MCH: 30.1 pg (ref 26.0–34.0)
MCHC: 32 g/dL (ref 30.0–36.0)
MCV: 94.1 fL (ref 80.0–100.0)
Monocytes Absolute: 0.9 10*3/uL (ref 0.1–1.0)
Monocytes Relative: 15 %
Neutro Abs: 3.9 10*3/uL (ref 1.7–7.7)
Neutrophils Relative %: 64 %
Platelets: 173 10*3/uL (ref 150–400)
RBC: 2.56 MIL/uL — ABNORMAL LOW (ref 4.22–5.81)
RDW: 15.8 % — ABNORMAL HIGH (ref 11.5–15.5)
WBC: 6 10*3/uL (ref 4.0–10.5)
nRBC: 0 % (ref 0.0–0.2)

## 2019-01-19 LAB — CULTURE, BLOOD (ROUTINE X 2)
Culture: NO GROWTH
Culture: NO GROWTH
Special Requests: ADEQUATE

## 2019-01-19 LAB — BASIC METABOLIC PANEL
Anion gap: 5 (ref 5–15)
BUN: 41 mg/dL — ABNORMAL HIGH (ref 8–23)
CO2: 19 mmol/L — ABNORMAL LOW (ref 22–32)
Calcium: 7.7 mg/dL — ABNORMAL LOW (ref 8.9–10.3)
Chloride: 112 mmol/L — ABNORMAL HIGH (ref 98–111)
Creatinine, Ser: 1.74 mg/dL — ABNORMAL HIGH (ref 0.61–1.24)
GFR calc Af Amer: 42 mL/min — ABNORMAL LOW (ref >60)
GFR calc non Af Amer: 36 mL/min — ABNORMAL LOW (ref >60)
Glucose, Bld: 133 mg/dL — ABNORMAL HIGH (ref 70–99)
Potassium: 4.6 mmol/L (ref 3.5–5.1)
Sodium: 136 mmol/L (ref 135–145)

## 2019-01-19 LAB — BPAM RBC
Blood Product Expiration Date: 202012062359
ISSUE DATE / TIME: 202011051232
Unit Type and Rh: 5100

## 2019-01-19 LAB — PHOSPHORUS: Phosphorus: 2.3 mg/dL — ABNORMAL LOW (ref 2.5–4.6)

## 2019-01-19 MED ORDER — ALBUTEROL SULFATE (2.5 MG/3ML) 0.083% IN NEBU: 2.5000 mg | INHALATION_SOLUTION | Freq: Four times a day (QID) | RESPIRATORY_TRACT | Status: DC | PRN

## 2019-01-19 MED ORDER — IOHEXOL 300 MG/ML  SOLN
75.0000 mL | Freq: Once | INTRAMUSCULAR | Status: AC | PRN
  Administered 2019-01-19: 75 mL via INTRAVENOUS

## 2019-01-19 NOTE — Plan of Care (Signed)

## 2019-01-19 NOTE — Progress Notes (Signed)
   Subjective:    Patient ID: Anthony [name], male    DOB: 03-14-1941, 78 y.o.   MRN: [id number]  HPI No complaints.  Review of Systems     Objective:   Physical Exam AF VSS Alert, NAD Right neck/chest incision with some dehiscence at lateral neck, distal extent of flap with skin necrosis WBC 6.0  Hgb 7.7     Assessment & Plan:  Right neck cancer s/p parotidectomy, radical neck dissection, and cervicopectoral flap, resolved fever, improved anemia  The surgical site has some dehiscence at lateral neck and skin necrosis of distal flap.  Chest CT does not show evidence of metastasis.  Will discuss revision resection further with legal guardian, possibly planned for tomorrow.  Will address dehiscence at that time.  Flap is likely to heal in spite of skin necrosis.

## 2019-01-19 NOTE — Progress Notes (Signed)
Update and consent given for surgery tomorrow with Dr. Redmond Baseman given by Betsey Amen, SW-legal guardian for Mr. Celedon. Verified + witnessed by 2 RNs via phone. Consent in chart.

## 2019-01-19 NOTE — TOC Progression Note (Signed)
Transition of Care [facility]) - Progression Note    Patient Details  Name: Anthony [name] MRN: [id number] Date of Birth: Nov 05, 1940  Transition of Care [facility]) CM/SW [place], [place] Phone Number: 01/19/2019, 1:28 PM  Clinical Narrative:     CSW is following for disposition needs. Patient will be here a few more days for continued medical workup. Patient will need updated COVID screen before discharge.   CSW is continuing to follow and assist with discharge planning needs.   Expected Discharge Plan: [place] Barriers to Discharge: Continued Medical Work up  Expected Discharge Plan and Services Expected Discharge Plan: [place] Choice: [place] arrangements for the past 2 months: [place]                                       Social Determinants of Health (SDOH) Interventions    Readmission Risk Interventions No flowsheet data found.

## 2019-01-19 NOTE — Anesthesia Preprocedure Evaluation (Addendum)
Anesthesia Evaluation  Patient identified by MRN, date of birth, ID band Patient awake    Reviewed: Allergy & Precautions, NPO status , Patient's Chart, lab work & pertinent test results  Airway Mallampati: Trach       Dental   Pulmonary COPD, Patient abstained from smoking., former smoker,    Pulmonary exam normal        Cardiovascular hypertension, Normal cardiovascular exam     Neuro/Psych  Headaches, PSYCHIATRIC DISORDERS Anxiety Depression Dementia    GI/Hepatic Neg liver ROS, GERD  ,  Endo/Other  negative endocrine ROS  Renal/GU CRFRenal disease     Musculoskeletal   Abdominal   Peds  Hematology  (+) anemia ,   Anesthesia Other Findings   Reproductive/Obstetrics                           Anesthesia Physical Anesthesia Plan  ASA: III  Anesthesia Plan: General   Post-op Pain Management:    Induction: Intravenous  PONV Risk Score and Plan: 2 and Ondansetron and Dexamethasone  Airway Management Planned: Oral ETT and Tracheostomy  Additional Equipment:   Intra-op Plan:   Post-operative Plan: Possible Post-op intubation/ventilation  Informed Consent: I have reviewed the patients History and Physical, chart, labs and discussed the procedure including the risks, benefits and alternatives for the proposed anesthesia with the patient or authorized representative who has indicated his/her understanding and acceptance.       Plan Discussed with: Anesthesiologist, CRNA and Surgeon  Anesthesia Plan Comments:       Anesthesia Quick Evaluation

## 2019-01-19 NOTE — Progress Notes (Signed)
PROGRESS NOTE    Anthony [name]  [id number] DOB: August 20, 1940 DOA: 01/11/2019 PCP: Pablo Ledger, MD    Brief Narrative:  Anthony [name] is an 78 y.o. 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  Patient developed fever 103 with leukocytosis on day 2 surgery so medicine consulted. Patient remained in the hospital with low blood pressures, febrile, anemia. Surgery discussing about further surgical plan with positive tumor margins.   Assessment & Plan:   Active Problems:   Anemia   Tobacco abuse   COPD (chronic obstructive pulmonary disease) (HCC)   CKD (chronic kidney disease) stage 3, GFR 30-59 ml/min   Dementia (HCC)   Tracheostomy in place (HCC)   Head and neck cancer (HCC)   Malnutrition of moderate degree   Depression   Suprapubic catheter (HCC)   Restless leg syndrome  Postoperative fever: Likely UTI due to indwelling suprapubic catheter. Patient did have tenderness and thick secretions  around the suprapubic tube. All cultures are negative.   Urine cultures with multiple organisms on initial collection.   Repeat cultures after antibiotics started negative.   Blood pressures adequately improved.   Procalcitonin and lactic acid were normal.   He received more than 5 days of antibiotics including initial vancomycin and cefepime.  Will discontinue all antibiotics.    Chronic kidney disease stage III: On maintenance IV fluids.  Stable around at baseline.  Acute on chronic anemia of chronic disease: Hemoglobin continued to downtrend.  Received 1 unit of PRBC with appropriate improvement.  Could not tolerate  iron.     Dementia, deconditioning and impaired mobility: Has tracheostomy and functioning well. Continue chest physiotherapy.  Chest x-ray was normal. PEG tube infusing well.  Patient also allowed clear liquid diet. Other chronic medical issues including dementia, restless leg syndrome remained stable.  Recurrent head and neck cancer status post resection: He has wound dehiscence.  Surgery reported positive margins.  Unsure whether he is a candidate for further resection.  As per surgery. Saw plan for CT scan of chest to look for any metastasis.  If he has metastatic disease, he will benefit with palliative approach. Surgery discussing with POA.  We will continue to follow with you.  DVT prophylaxis: SCDs Code Status: Full code Family Communication: I have discussed with patient's healthcare power of attorney, Education officer, museum at health department, Ms. Lenna Sciara. Disposition Plan: Anticipate discharge back to nursing home when stable from surgical standpoint.    Procedures:   Radical neck dissection and flap.  Antimicrobials:   Vancomycin, 01/14/2019-01/16/2019   cefepime, 01/14/2019----01/19/2019   Subjective: Patient seen and examined.  Blood pressures are adequate today.   Patient himself has no complaints. Afebrile last 24 hours.   Objective: Vitals:   01/19/19 0318 01/19/19 0345 01/19/19 0810 01/19/19 0849  BP:  (!) 107/57  (!) 109/54  Pulse: 66  65 79  Resp: (!) 21 (!) 23 19 18   Temp:  98.8 F (37.1 C)  98.3 F (36.8 C)  TempSrc:  Axillary  Axillary  SpO2: 98%  98% 98%  Weight:      Height:        Intake/Output Summary (Last 24 hours) at 01/19/2019 1124 Last data filed  at 01/19/2019 1000 Gross per 24 hour  Intake 10902.57 ml  Output 4425 ml  Net 6477.57 ml   Filed Weights   01/11/19 0717 01/18/19 1300  Weight: 75.3 kg 81 kg    Examination:  General exam: Appears calm and comfortable, chronically sick looking.  Has lip deformity with previous incision.  Right neck surgical scar has some dehiscence along the clavicle margins.  Incision area is with some redness, no obvious fluctuation.  No obvious drainage. Respiratory system: Clear to auscultation. Respiratory effort normal.  Mostly conducted airway sounds, On trach collar. Right neck incision clean , dry. Cardiovascular system: S1 & S2 heard, RRR. No JVD, murmurs, rubs, gallops or clicks. No pedal edema. Gastrointestinal system: Abdomen is nondistended, soft.  PEG tube intact and feeding infusing.  No organomegaly or masses felt. Normal bowel sounds heard. PEG tube anchor has come off but is in place.  Functional. Central nervous system: Alert but not oriented.  Unsure about orientation.  Follows commands.  Tries to engage in conversation. Able to move all extremities. Extremities: Symmetric 5 x 5 power. Skin: No rashes, lesions or ulcers Psychiatry: Judgement and insight appear normal. Mood & affect appropriate.      Data Reviewed: I have personally reviewed following labs and imaging studies  CBC: Recent Labs  Lab 01/15/19 0233 01/16/19 0214 01/17/19 1355 01/18/19 0214 01/19/19 0217  WBC 6.5 7.6 10.9* 7.5 6.0  NEUTROABS 4.8 5.0 9.2* 5.9 3.9  HGB 7.5* 7.8* 7.0* 6.8* 7.7*  HCT 24.1* 25.2* 23.1* 22.6* 24.1*  MCV 95.3 97.3 98.7 97.0 94.1  PLT 228 217 179 170 [id number]   Basic Metabolic Panel: Recent Labs  Lab 01/14/19 1137 01/15/19 0233 01/16/19 0214 01/18/19 0214 01/19/19 0217  NA 137 136 137 135 136  K 4.0 4.4 4.2 4.6 4.6  CL 109 110 110 110 112*  CO2 20* 20* 20* 19* 19*  GLUCOSE 151* 176* 148* 153* 133*  BUN 29* 29* 28* 42* 41*  CREATININE 1.74* 1.66* 1.45* 1.77* 1.74*  CALCIUM 8.0* 7.6* 7.8* 7.4* 7.7*  MG  --   --  2.1 1.9 2.0  PHOS  --   --  3.1 2.2* 2.3*   GFR: Estimated Creatinine Clearance: 38.8 mL/min (A) (by C-G formula based on SCr of 1.74 mg/dL (H)). Liver Function Tests: Recent Labs  Lab 01/18/19 0214  AST 18  ALT 16  ALKPHOS 64  BILITOT 0.4  PROT  5.8*  ALBUMIN 1.6*   No results for input(s): LIPASE, AMYLASE in the last 168 hours. No results for input(s): AMMONIA in the last 168 hours. Coagulation Profile: No results for input(s): INR, PROTIME in the last 168 hours. Cardiac Enzymes: No results for input(s): CKTOTAL, CKMB, CKMBINDEX, TROPONINI in the last 168 hours. BNP (last 3 results) No results for input(s): PROBNP in the last 8760 hours. HbA1C: No results for input(s): HGBA1C in the last 72 hours. CBG: Recent Labs  Lab 01/16/19 1944  GLUCAP 127*   Lipid Profile: No results for input(s): CHOL, HDL, LDLCALC, TRIG, CHOLHDL, LDLDIRECT in the last 72 hours. Thyroid Function Tests: No results for input(s): TSH, T4TOTAL, FREET4, T3FREE, THYROIDAB in the last 72 hours. Anemia Panel: No results for input(s): VITAMINB12, FOLATE, FERRITIN, TIBC, IRON, RETICCTPCT in the last 72 hours. Sepsis Labs: Recent Labs  Lab 01/14/19 1137 01/14/19 1514 01/15/19 0233 01/16/19 0214 01/17/19 1617  PROCALCITON 0.26  --  0.28 0.21  --   LATICACIDVEN 1.6 1.2  --   --  1.0  Recent Results (from the past 240 hour(s))  SARS CORONAVIRUS 2 (TAT 6-24 HRS) Nasopharyngeal Nasopharyngeal Swab     Status: None   Collection Time: 01/11/19  5:30 PM   Specimen: Nasopharyngeal Swab  Result Value Ref Range Status   SARS Coronavirus 2 NEGATIVE NEGATIVE Final    Comment: (NOTE) SARS-CoV-2 target nucleic acids are NOT DETECTED. The SARS-CoV-2 RNA is generally detectable in upper and lower respiratory specimens during the acute phase of infection. Negative results do not preclude SARS-CoV-2 infection, do not rule out co-infections with other pathogens, and should not be used as the sole basis for treatment or other patient management decisions. Negative results must be combined with clinical observations, patient history, and epidemiological information. The expected result is Negative. Fact Sheet for Patients:  SugarRoll.be Fact Sheet for Healthcare Providers: https://www.woods-mathews.com/ This test is not yet approved or cleared by the [place] FDA and  has been authorized for detection and/or diagnosis of SARS-CoV-2 by FDA under an Emergency Use Authorization (EUA). This EUA will remain  in effect (meaning this test can be used) for the duration of the COVID-19 declaration under Section 56 4(b)(1) of the Act, 21 U.S.C. section 360bbb-3(b)(1), unless the authorization is terminated or revoked sooner. Performed at [facility], [place] [address] [place], [place] [address]   MRSA PCR Screening     Status: Abnormal   Collection Time: 01/12/19 12:54 AM   Specimen: Nasopharyngeal  Result Value Ref Range Status   MRSA by PCR POSITIVE (A) NEGATIVE Final    Comment:        The GeneXpert MRSA Assay (FDA approved for NASAL specimens only), is one component of a comprehensive MRSA colonization surveillance program. It is not intended to diagnose MRSA infection nor to guide or monitor treatment for MRSA infections. RESULT CALLED TO, READ BACK BY AND VERIFIED WITH: NEWCOMER,S RN 01/12/2019 AT [id number] SKEEN,P Performed at [facility], [place] [address], [place], [place] [address]   Culture, Urine     Status: Abnormal   Collection Time: 01/14/19  2:00 PM   Specimen: Urine, Catheterized  Result Value Ref Range Status   Specimen Description URINE, CATHETERIZED  Final   Special Requests   Final    NONE Performed at [facility], [place] [address], [place], [place] [address]    Culture MULTIPLE SPECIES PRESENT, SUGGEST RECOLLECTION (A)  Final   Report Status 01/15/2019 FINAL  Final  Culture, blood (routine x 2)     Status: None (Preliminary result)   Collection Time: 01/14/19  3:44 PM   Specimen: BLOOD RIGHT HAND  Result Value Ref Range Status   Specimen Description BLOOD RIGHT HAND  Final   Special Requests   Final    BOTTLES DRAWN  AEROBIC ONLY Blood Culture adequate volume   Culture   Final    NO GROWTH 4 DAYS Performed at [facility], [place] [address] [place], [place] [address]    Report Status PENDING  Incomplete  Culture, blood (routine x 2)     Status: None (Preliminary result)   Collection Time: 01/14/19  3:44 PM   Specimen: BLOOD RIGHT ARM  Result Value Ref Range Status   Specimen Description BLOOD RIGHT ARM  Final   Special Requests   Final    BOTTLES DRAWN AEROBIC AND ANAEROBIC Blood Culture results may not be optimal due to an excessive volume of blood received in culture bottles   Culture   Final  NO GROWTH 4 DAYS Performed at [facility], [place] [address], [place], [place] [address]    Report Status PENDING  Incomplete  Culture, Urine     Status: None   Collection Time: 01/15/19 11:51 AM   Specimen: Urine, Suprapubic  Result Value Ref Range Status   Specimen Description URINE, SUPRAPUBIC  Final   Special Requests NONE  Final   Culture   Final    NO GROWTH Performed at [facility], [place] [address] [place], [place] [address]    Report Status 01/16/2019 FINAL  Final  Culture, blood (routine x 2)     Status: None (Preliminary result)   Collection Time: 01/17/19  4:23 PM   Specimen: BLOOD RIGHT ARM  Result Value Ref Range Status   Specimen Description BLOOD RIGHT ARM  Final   Special Requests   Final    BOTTLES DRAWN AEROBIC ONLY Blood Culture adequate volume   Culture   Final    NO GROWTH < 24 HOURS Performed at [facility], [place] [address], [place], [place] [address]    Report Status PENDING  Incomplete  Culture, blood (routine x 2)     Status: None (Preliminary result)   Collection Time: 01/17/19  4:23 PM   Specimen: BLOOD RIGHT ARM  Result Value Ref Range Status   Specimen Description BLOOD RIGHT ARM  Final   Special Requests   Final    BOTTLES DRAWN AEROBIC ONLY Blood Culture adequate volume   Culture   Final    NO GROWTH < 24 HOURS Performed at [facility], [place] [address], [place], [place] [address]    Report Status PENDING  Incomplete         Radiology Studies: No results found.      Scheduled Meds: . sodium chloride   Intravenous Once  . artificial tears   Right Eye QHS  . bacitracin  1 application Topical [id number]  . chlorhexidine  15 mL Mouth Rinse BID  . feeding supplement  1 Container Oral TID BM  . feeding supplement (PRO-STAT SUGAR FREE 64)  30 mL Per Tube BID  . free water  200 mL Per Tube Q8H  . gabapentin  100 mg Per Tube TID  . mouth rinse  15 mL Mouth Rinse q12n4p  . Melatonin  6 mg Oral QHS  . memantine  5 mg Oral Daily  . multivitamin with minerals  1 tablet Oral Daily  . pantoprazole  40 mg Oral Daily  . potassium & sodium phosphates  1 packet Oral TID WC & HS  . pramipexole  0.125 mg Oral QHS  . sertraline  50 mg Oral Daily   Continuous Infusions: . ceFEPime (MAXIPIME) IV Stopped (01/18/19 2154)  . dextrose 5 % and 0.45 % NaCl with KCl 20 mEq/L 75 mL/hr at 01/19/19 0700  . feeding supplement (OSMOLITE 1.5 CAL) 50 mL/hr at 01/19/19 0700     LOS: 8 days    Time spent: 30 minutes    Barb Merino, MD [facility] Pager [phone]

## 2019-01-19 NOTE — Care Management Important Message (Signed)
Important Message  Patient Details  Name: Anthony [name] MRN: [id number] Date of Birth: Jan 29, 1941   Medicare Important Message Given:  Yes     Jonuel, [place] 01/19/2019, 3:12 PM

## 2019-01-20 MED ORDER — LIDOCAINE-EPINEPHRINE 2 %-1:100000 IJ SOLN
INTRAMUSCULAR | Status: AC
  Filled 2019-01-20: qty 1

## 2019-01-20 MED ORDER — FENTANYL CITRATE (PF) 250 MCG/5ML IJ SOLN
INTRAMUSCULAR | Status: AC
  Filled 2019-01-20: qty 5

## 2019-01-20 MED ORDER — LIDOCAINE-EPINEPHRINE 1 %-1:100000 IJ SOLN
INTRAMUSCULAR | Status: DC | PRN
  Administered 2019-01-20: 20 mL
  Administered 2019-01-21: .0001 mL

## 2019-01-20 MED ORDER — BACITRACIN ZINC 500 UNIT/GM EX OINT
TOPICAL_OINTMENT | CUTANEOUS | Status: AC
  Filled 2019-01-20: qty 28.35

## 2019-01-20 MED ORDER — PROPOFOL 10 MG/ML IV BOLUS
INTRAVENOUS | Status: AC
  Filled 2019-01-20: qty 20

## 2019-01-20 NOTE — OR Nursing (Signed)
01/20/2019 @ 0730  Procedure cancelled by Dr. Redmond Baseman due to tube feedings not turned off. Supplies were open and wasted in chart for charge purposes only.

## 2019-01-20 NOTE — Progress Notes (Signed)
   Subjective:    Patient ID: Anthony [name], male    DOB: 22-Apr-1940, 78 y.o.   MRN: [id number]  HPI No complaints.  Review of Systems     Objective:   Physical Exam AF VSS Alert, NAD Right neck/chest incision with some dehiscence superolaterally, distal flap with skin necrosis WBC 6.0  Hgb 7.7     Assessment & Plan:  Right neck cancer s/p right parotidectomy, neck dissection, and cervicopectoral flap  Tube feeding was not stopped by nursing.  Will cancel surgery for today and move to tomorrow.

## 2019-01-20 NOTE — TOC Progression Note (Signed)
Transition of Care [facility]) - Progression Note    Patient Details  Name: Anthony [name] MRN: [id number] Date of Birth: 1940/04/09  Transition of Care [facility]) CM/SW [place], [place] Phone Number: 01/20/2019, 11:05 AM  Clinical Narrative:    CSW continuing to follow; pt will need a new COVID swab (24-48 hrs) before discharge.    Expected Discharge Plan: [place] Barriers to Discharge: Continued Medical Work up  Expected Discharge Plan and Services Expected Discharge Plan: [place] Choice: [place] arrangements for the past 2 months: [place]   Social Determinants of Health (SDOH) Interventions    Readmission Risk Interventions No flowsheet data found.

## 2019-01-20 NOTE — Progress Notes (Signed)
PROGRESS NOTE    [facility]  [id number] DOB: [birth year] DOA: 01/11/2019 PCP: Pablo Ledger, MD    Brief Narrative:  Anthony [name] is an 78 y.o. 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  Patient developed fever 103 with leukocytosis on day 2 surgery so medicine consulted. Patient remained in the hospital with low blood pressures, febrile, anemia.  Temperature improved. Surgery planning repeat surgery for clear margins tomorrow.    Assessment & Plan:   Active Problems:   Anemia   Tobacco abuse   COPD (chronic obstructive pulmonary disease) (HCC)   CKD (chronic kidney disease) stage 3, GFR 30-59 ml/min   Dementia (HCC)   Tracheostomy in place (HCC)   Head and neck cancer (HCC)   Malnutrition of moderate degree   Depression   Suprapubic catheter (HCC)   Restless leg syndrome  Postoperative fever: Likely UTI due to indwelling suprapubic catheter.  Catheter exchanged.  Patient did have tenderness and thick secretions  around the suprapubic tube. All cultures are negative.  Urine cultures with multiple organisms on initial collection.  Repeat cultures after antibiotics started negative.   Blood pressures adequately improved.   Procalcitonin and lactic acid were normal.   He finished antibiotic therapy.  Chronic kidney disease stage III: On maintenance IV fluids.  Stable around at baseline.  Acute on chronic anemia of chronic disease: Hemoglobin continued to downtrend.  Received 1 unit of PRBC with appropriate improvement. Could not tolerate iron.   Recheck levels tomorrow morning.  Dementia, deconditioning  and impaired mobility: Has tracheostomy and functioning well. Continue chest physiotherapy.  Chest x-ray was normal. PEG tube infusing well.  Patient also allowed clear liquid diet. Other chronic medical issues including dementia, restless leg syndrome remained stable.  Recurrent head and neck cancer status post resection: He has wound dehiscence.  Surgery reported positive margins.  Since CT scan of the chest did not show any evidence of metastatic disease, ENT planning for reexploration tomorrow.  We will continue to follow with you.  DVT prophylaxis: SCDs Code Status: Full code Family Communication: None.  Patient has Education officer, [employer] at [employer] [employer] as guardian.  Surgery discussed with him. Disposition Plan: Anticipate discharge back to nursing home when stable from surgical standpoint.    Procedures:   Radical neck dissection and flap.  Antimicrobials:   Vancomycin, 01/14/2019-01/16/2019   cefepime, 01/14/2019----01/19/2019   Subjective: Patient seen and examined.  Blood pressures are adequate today.   No overnight events.  Unfortunately could not go for surgery because tube feeding were not stopped.   Objective: Vitals:   01/20/19 0020 01/20/19 0423 01/20/19 0500 01/20/19 0719  BP: (!) 107/55   107/71  Pulse: 68 61  71  Resp: 18 16  16   Temp:    97.9 F (36.6 C)  TempSrc:    Axillary  SpO2: 100% 100%  99%  Weight:   81.7 kg   Height:        Intake/Output Summary (Last 24 hours) at 01/20/2019 0847 Last data filed at 01/20/2019 0722 Gross per 24 hour  Intake 1483.24 ml  Output 4750 ml  Net -3266.76 ml   Filed Weights   01/11/19 0717 01/18/19 1300  01/20/19 0500  Weight: 75.3 kg 81 kg 81.7 kg    Examination: Physical Exam  Constitutional: He is well-developed, well-nourished, and in no distress. No distress.  He looks calm and comfortable.  Not in any distress.  He has some lip deformity from previous surgery. Has trach collar on place.  Without any  respiratory issues. PEG tube infusing and functioning well, has some anchors are out however functioning well.  Psychiatric:  Patient is alert and follows commands, generalized weakness but no focal deficits.        Data Reviewed: I have personally reviewed following labs and imaging studies  CBC: Recent Labs  Lab 01/15/19 0233 01/16/19 0214 01/17/19 1355 01/18/19 0214 01/19/19 0217  WBC 6.5 7.6 10.9* 7.5 6.0  NEUTROABS 4.8 5.0 9.2* 5.9 3.9  HGB 7.5* 7.8* 7.0* 6.8* 7.7*  HCT 24.1* 25.2* 23.1* 22.6* 24.1*  MCV 95.3 97.3 98.7 97.0 94.1  PLT 228 217 179 170 [id number]   Basic Metabolic Panel: Recent Labs  Lab 01/14/19 1137 01/15/19 0233 01/16/19 0214 01/18/19 0214 01/19/19 0217  NA 137 136 137 135 136  K 4.0 4.4 4.2 4.6 4.6  CL 109 110 110 110 112*  CO2 20* 20* 20* 19* 19*  GLUCOSE 151* 176* 148* 153* 133*  BUN 29* 29* 28* 42* 41*  CREATININE 1.74* 1.66* 1.45* 1.77* 1.74*  CALCIUM 8.0* 7.6* 7.8* 7.4* 7.7*  MG  --   --  2.1 1.9 2.0  PHOS  --   --  3.1 2.2* 2.3*   GFR: Estimated Creatinine Clearance: 39.1 mL/min (A) (by C-G formula based on SCr of 1.74 mg/dL (H)). Liver Function Tests: Recent Labs  Lab 01/18/19 0214  AST 18  ALT 16  ALKPHOS 64  BILITOT 0.4  PROT 5.8*  ALBUMIN 1.6*   No results for input(s): LIPASE, AMYLASE in the last 168 hours. No results for input(s): AMMONIA in the last 168 hours. Coagulation Profile: No results for input(s): INR, PROTIME in the last 168 hours. Cardiac Enzymes: No results for input(s): CKTOTAL, CKMB, CKMBINDEX, TROPONINI in the last 168 hours. BNP (last 3 results) No results for input(s): PROBNP in the last 8760 hours. HbA1C: No results for input(s): HGBA1C in the last 72 hours. CBG: Recent Labs  Lab 01/16/19 1944  GLUCAP 127*   Lipid Profile: No results for input(s): CHOL, HDL, LDLCALC, TRIG, CHOLHDL, LDLDIRECT in the last 72 hours. Thyroid Function Tests: No results for input(s): TSH, T4TOTAL, FREET4, T3FREE,  THYROIDAB in the last 72 hours. Anemia Panel: No results for input(s): VITAMINB12, FOLATE, FERRITIN, TIBC, IRON, RETICCTPCT in the last 72 hours. Sepsis Labs: Recent Labs  Lab 01/14/19 1137 01/14/19 1514 01/15/19 0233 01/16/19 0214 01/17/19 1617  PROCALCITON 0.26  --  0.28 0.21  --   LATICACIDVEN 1.6 1.2  --   --  1.0    Recent Results (from the past 240 hour(s))  SARS CORONAVIRUS 2 (TAT 6-24 HRS) Nasopharyngeal Nasopharyngeal Swab     Status: None   Collection Time: 01/11/19  5:30 PM   Specimen: Nasopharyngeal Swab  Result Value Ref Range Status   SARS Coronavirus 2 NEGATIVE NEGATIVE Final    Comment: (NOTE) SARS-CoV-2 target nucleic acids are NOT DETECTED. The SARS-CoV-2 RNA is generally detectable in upper and lower respiratory specimens during the acute phase of infection. Negative results do not preclude SARS-CoV-2 infection, do not rule out co-infections with other pathogens, and should not be used as the sole basis for treatment or other patient management decisions. Negative  results must be combined with clinical observations, patient history, and epidemiological information. The expected result is Negative. Fact Sheet for Patients: SugarRoll.be Fact Sheet for Healthcare Providers: https://www.woods-mathews.com/ This test is not yet approved or cleared by the [place] FDA and  has been authorized for detection and/or diagnosis of SARS-CoV-2 by FDA under an Emergency Use Authorization (EUA). This EUA will remain  in effect (meaning this test can be used) for the duration of the COVID-19 declaration under Section 56 4(b)(1) of the Act, 21 U.S.C. section 360bbb-3(b)(1), unless the authorization is terminated or revoked sooner. Performed at [facility], [place] [address], [place], [place] [address]   MRSA PCR Screening     Status: Abnormal   Collection Time: 01/12/19 12:54 AM   Specimen: Nasopharyngeal  Result Value  Ref Range Status   MRSA by PCR POSITIVE (A) NEGATIVE Final    Comment:        The GeneXpert MRSA Assay (FDA approved for NASAL specimens only), is one component of a comprehensive MRSA colonization surveillance program. It is not intended to diagnose MRSA infection nor to guide or monitor treatment for MRSA infections. RESULT CALLED TO, READ BACK BY AND VERIFIED WITH: NEWCOMER,S RN 01/12/2019 AT XI:[id number] SKEEN,P Performed at [facility], [place] [address], [place], [place] [address]   Culture, Urine     Status: Abnormal   Collection Time: 01/14/19  2:00 PM   Specimen: Urine, Catheterized  Result Value Ref Range Status   Specimen Description URINE, CATHETERIZED  Final   Special Requests   Final    NONE Performed at [facility], [place] [address] [place], [place] [address]    Culture MULTIPLE SPECIES PRESENT, SUGGEST RECOLLECTION (A)  Final   Report Status 01/15/2019 FINAL  Final  Culture, blood (routine x 2)     Status: None   Collection Time: 01/14/19  3:44 PM   Specimen: BLOOD RIGHT HAND  Result Value Ref Range Status   Specimen Description BLOOD RIGHT HAND  Final   Special Requests   Final    BOTTLES DRAWN AEROBIC ONLY Blood Culture adequate volume   Culture   Final    NO GROWTH 5 DAYS Performed at [facility], [place] [address], [place], [place] [address]    Report Status 01/19/2019 FINAL  Final  Culture, blood (routine x 2)     Status: None   Collection Time: 01/14/19  3:44 PM   Specimen: BLOOD RIGHT ARM  Result Value Ref Range Status   Specimen Description BLOOD RIGHT ARM  Final   Special Requests   Final    BOTTLES DRAWN AEROBIC AND ANAEROBIC Blood Culture results may not be optimal due to an excessive volume of blood received in culture bottles   Culture   Final    NO GROWTH 5 DAYS Performed at [facility], [place] [address], [place], [place] [address]    Report Status 01/19/2019 FINAL  Final  Culture, Urine     Status: None   Collection Time:  01/15/19 11:51 AM   Specimen: Urine, Suprapubic  Result Value Ref Range Status   Specimen Description URINE, SUPRAPUBIC  Final   Special Requests NONE  Final   Culture   Final    NO GROWTH Performed at [facility], [place] [address], [place], [place] [address]    Report Status 01/16/2019 FINAL  Final  Culture, blood (routine x 2)     Status: None (Preliminary result)  Collection Time: 01/17/19  4:23 PM   Specimen: BLOOD RIGHT ARM  Result Value Ref Range Status   Specimen Description BLOOD RIGHT ARM  Final   Special Requests   Final    BOTTLES DRAWN AEROBIC ONLY Blood Culture adequate volume   Culture   Final    NO GROWTH 3 DAYS Performed at [facility], [address], [place], [place] [address]    Report Status PENDING  Incomplete  Culture, blood (routine x 2)     Status: None (Preliminary result)   Collection Time: 01/17/19  4:23 PM   Specimen: BLOOD RIGHT ARM  Result Value Ref Range Status   Specimen Description BLOOD RIGHT ARM  Final   Special Requests   Final    BOTTLES DRAWN AEROBIC ONLY Blood Culture adequate volume   Culture   Final    NO GROWTH 3 DAYS Performed at [facility], [place] [address] [place], [place] [address]    Report Status PENDING  Incomplete         Radiology Studies: Ct Chest W Contrast  Result Date: 01/19/2019 CLINICAL DATA:  Head and neck cancer. Evaluate for metastatic disease. EXAM: CT CHEST WITH CONTRAST TECHNIQUE: Multidetector CT imaging of the chest was performed during intravenous contrast administration. CONTRAST:  37mL OMNIPAQUE IOHEXOL 300 MG/ML  SOLN COMPARISON:  Chest CT 04/18/2018. Neck CT 01/04/2019. FINDINGS: Cardiovascular: The contrast bolus is suboptimal. No acute vascular findings are identified. There is atherosclerosis of the aorta, great vessels and coronary arteries. The heart size is normal. There is no pericardial effusion. Mediastinum/Nodes: There are small mediastinal lymph nodes, including a stable 11  mm subcarinal node on image 76/3. A right infrahilar node measuring 14 mm on image 93/3 is slightly larger. There are no enlarged hilar or axillary lymph nodes. Tracheostomy appears unchanged. The thyroid gland and esophagus appear unremarkable. Lungs/Pleura: New small bilateral pleural effusions and dependent bibasilar pulmonary opacities, probably reflecting atelectasis. There is underlying moderate centrilobular emphysema and biapical scarring. Previously demonstrated subpleural left lower lobe pulmonary nodule is obscured by airspace disease. No new or enlarging nodules identified. Lower lobe predominant central airway thickening noted. Upper abdomen: The visualized upper abdomen appears stable without suspicious findings. Musculoskeletal/Chest wall: There are postsurgical changes in the right neck and right anterior chest wall related to recent radical neck dissection and cervical pectoral rotation flap procedure. No acute osseous findings are identified. There is a chronic sclerotic lesion in the proximal right humeral diaphysis, grossly stable from prior radiographs dating back to 12/24/2016. IMPRESSION: 1. New small bilateral pleural effusions and dependent bibasilar pulmonary opacities, probably reflecting atelectasis. Aspiration should be excluded clinically. 2. No definite signs of metastatic disease within the chest. The previously demonstrated left lower lobe pulmonary nodule is obscured by airspace disease. A subcarinal node has mildly enlarged, likely reactive. No axillary adenopathy. 3. Recent postsurgical changes as described. 4. Stable chronic sclerotic lesion in the proximal right humeral diaphysis. 5. Aortic Atherosclerosis (ICD10-I70.0) and Emphysema (ICD10-J43.9). Electronically Signed   By: Richardean Sale M.D.   On: 01/19/2019 12:26        Scheduled Meds: . sodium chloride   Intravenous Once  . artificial tears   Right Eye QHS  . bacitracin  1 application Topical [id number]  .  chlorhexidine  15 mL Mouth Rinse BID  . feeding supplement  1 Container Oral TID BM  . feeding supplement (PRO-STAT SUGAR FREE 64)  30 mL Per Tube BID  . free water  200 mL Per Tube Q8H  . gabapentin  100 mg Per Tube TID  . mouth rinse  15 mL Mouth Rinse q12n4p  . Melatonin  6 mg Oral QHS  . memantine  5 mg Oral Daily  . multivitamin with minerals  1 tablet Oral Daily  . pantoprazole  40 mg Oral Daily  . potassium & sodium phosphates  1 packet Oral TID WC & HS  . pramipexole  0.125 mg Oral QHS  . sertraline  50 mg Oral Daily   Continuous Infusions: . dextrose 5 % and 0.45 % NaCl with KCl 20 mEq/L 75 mL/hr at 01/20/19 0015  . feeding supplement (OSMOLITE 1.5 CAL) 1,000 mL (01/19/19 1134)     LOS: 9 days    Time spent: 25 minutes    Barb Merino, MD [facility] Pager [phone]

## 2019-01-21 ENCOUNTER — Encounter (HOSPITAL_COMMUNITY)
Admission: RE | Disposition: A | Discharge status: Skilled Nursing Facility | Payer: Self-pay | Source: Home / Self Care | Attending: Otolaryngology

## 2019-01-21 ENCOUNTER — Inpatient Hospital Stay (HOSPITAL_COMMUNITY): Payer: Medicare Other | Admitting: Anesthesiology

## 2019-01-21 ENCOUNTER — Encounter (HOSPITAL_COMMUNITY): Payer: Self-pay | Admitting: Certified Registered"

## 2019-01-21 HISTORY — PX: PAROTIDECTOMY: SHX2163

## 2019-01-21 LAB — CBC WITH DIFFERENTIAL/PLATELET
Abs Immature Granulocytes: 0.23 10*3/uL — ABNORMAL HIGH (ref 0.00–0.07)
Basophils Absolute: 0 10*3/uL (ref 0.0–0.1)
Basophils Relative: 1 %
Eosinophils Absolute: 0.4 10*3/uL (ref 0.0–0.5)
Eosinophils Relative: 5 %
HCT: 26.1 % — ABNORMAL LOW (ref 39.0–52.0)
Hemoglobin: 8.2 g/dL — ABNORMAL LOW (ref 13.0–17.0)
Immature Granulocytes: 3 %
Lymphocytes Relative: 12 %
Lymphs Abs: 0.9 10*3/uL (ref 0.7–4.0)
MCH: 30 pg (ref 26.0–34.0)
MCHC: 31.4 g/dL (ref 30.0–36.0)
MCV: 95.6 fL (ref 80.0–100.0)
Monocytes Absolute: 0.9 10*3/uL (ref 0.1–1.0)
Monocytes Relative: 13 %
Neutro Abs: 4.8 10*3/uL (ref 1.7–7.7)
Neutrophils Relative %: 66 %
Platelets: 216 10*3/uL (ref 150–400)
RBC: 2.73 MIL/uL — ABNORMAL LOW (ref 4.22–5.81)
RDW: 15.4 % (ref 11.5–15.5)
WBC: 7.3 10*3/uL (ref 4.0–10.5)
nRBC: 0 % (ref 0.0–0.2)

## 2019-01-21 LAB — BASIC METABOLIC PANEL
Anion gap: 6 (ref 5–15)
BUN: 36 mg/dL — ABNORMAL HIGH (ref 8–23)
CO2: 20 mmol/L — ABNORMAL LOW (ref 22–32)
Calcium: 7.9 mg/dL — ABNORMAL LOW (ref 8.9–10.3)
Chloride: 110 mmol/L (ref 98–111)
Creatinine, Ser: 1.6 mg/dL — ABNORMAL HIGH (ref 0.61–1.24)
GFR calc Af Amer: 46 mL/min — ABNORMAL LOW (ref >60)
GFR calc non Af Amer: 40 mL/min — ABNORMAL LOW (ref >60)
Glucose, Bld: 151 mg/dL — ABNORMAL HIGH (ref 70–99)
Potassium: 4.7 mmol/L (ref 3.5–5.1)
Sodium: 136 mmol/L (ref 135–145)

## 2019-01-21 LAB — GLUCOSE, CAPILLARY: Glucose-Capillary: 124 mg/dL — ABNORMAL HIGH (ref 70–99)

## 2019-01-21 SURGERY — EXCISION, PAROTID GLAND
Anesthesia: General | Laterality: Right

## 2019-01-21 MED ORDER — SUCCINYLCHOLINE CHLORIDE 200 MG/10ML IV SOSY
PREFILLED_SYRINGE | INTRAVENOUS | Status: DC | PRN
  Administered 2019-01-21: 100 mg via INTRAVENOUS

## 2019-01-21 MED ORDER — FENTANYL CITRATE (PF) 100 MCG/2ML IJ SOLN: 25.0000 ug | INTRAMUSCULAR | Status: DC | PRN

## 2019-01-21 MED ORDER — 0.9 % SODIUM CHLORIDE (POUR BTL) OPTIME
TOPICAL | Status: DC | PRN
  Administered 2019-01-21: 1000 mL

## 2019-01-21 MED ORDER — CLINDAMYCIN PHOSPHATE 900 MG/50ML IV SOLN
INTRAVENOUS | Status: DC | PRN
  Administered 2019-01-21: 900 mg via INTRAVENOUS

## 2019-01-21 MED ORDER — BACITRACIN ZINC 500 UNIT/GM EX OINT
TOPICAL_OINTMENT | CUTANEOUS | Status: AC
  Filled 2019-01-21: qty 28.35

## 2019-01-21 MED ORDER — LIDOCAINE 2% (20 MG/ML) 5 ML SYRINGE
INTRAMUSCULAR | Status: DC | PRN
  Administered 2019-01-21: 100 mg via INTRAVENOUS

## 2019-01-21 MED ORDER — ONDANSETRON HCL 4 MG/2ML IJ SOLN
INTRAMUSCULAR | Status: DC | PRN
  Administered 2019-01-21: 4 mg via INTRAVENOUS

## 2019-01-21 MED ORDER — LIDOCAINE-EPINEPHRINE 1 %-1:100000 IJ SOLN
INTRAMUSCULAR | Status: AC
  Filled 2019-01-21: qty 1

## 2019-01-21 MED ORDER — PROPOFOL 500 MG/50ML IV EMUL
INTRAVENOUS | Status: DC | PRN
  Administered 2019-01-21: 100 mg via INTRAVENOUS
  Administered 2019-01-21: 30 mg via INTRAVENOUS

## 2019-01-21 MED ORDER — FENTANYL CITRATE (PF) 250 MCG/5ML IJ SOLN
INTRAMUSCULAR | Status: AC
  Filled 2019-01-21: qty 5

## 2019-01-21 MED ORDER — BACITRACIN ZINC 500 UNIT/GM EX OINT
TOPICAL_OINTMENT | CUTANEOUS | Status: DC | PRN
  Administered 2019-01-21: 1 via TOPICAL

## 2019-01-21 MED ORDER — PANTOPRAZOLE SODIUM 40 MG PO PACK
40.0000 mg | PACK | Freq: Every day | ORAL | Status: DC
  Administered 2019-01-22 – 2019-01-25 (×4): 40 mg
  Filled 2019-01-21 (×4): qty 20

## 2019-01-21 MED ORDER — CLINDAMYCIN PHOSPHATE 900 MG/50ML IV SOLN
INTRAVENOUS | Status: AC
  Filled 2019-01-21: qty 50

## 2019-01-21 MED ORDER — ADULT MULTIVITAMIN LIQUID CH
15.0000 mL | Freq: Every day | ORAL | Status: DC
  Administered 2019-01-22 – 2019-01-25 (×4): 15 mL via ORAL
  Filled 2019-01-21 (×5): qty 15

## 2019-01-21 MED ORDER — PROMETHAZINE HCL 25 MG/ML IJ SOLN: 6.2500 mg | INTRAMUSCULAR | Status: DC | PRN

## 2019-01-21 MED ORDER — PROPOFOL 10 MG/ML IV BOLUS
INTRAVENOUS | Status: AC
  Filled 2019-01-21: qty 20

## 2019-01-21 MED ORDER — FENTANYL CITRATE (PF) 250 MCG/5ML IJ SOLN
INTRAMUSCULAR | Status: DC | PRN
  Administered 2019-01-21 (×5): 50 ug via INTRAVENOUS
  Administered 2019-01-21 (×2): 25 ug via INTRAVENOUS

## 2019-01-21 MED ORDER — LACTATED RINGERS IV SOLN
INTRAVENOUS | Status: DC | PRN
  Administered 2019-01-21: 07:00:00 via INTRAVENOUS

## 2019-01-21 SURGICAL SUPPLY — 41 items
ATTRACTOMAT 16X20 MAGNETIC DRP (DRAPES) ×2 IMPLANT
CANISTER SUCT 3000ML PPV (MISCELLANEOUS) ×2 IMPLANT
CLEANER TIP ELECTROSURG 2X2 (MISCELLANEOUS) ×2 IMPLANT
CONT SPEC 4OZ CLIKSEAL STRL BL (MISCELLANEOUS) ×2 IMPLANT
CORD BIPOLAR FORCEPS 12FT (ELECTRODE) ×2 IMPLANT
COVER SURGICAL LIGHT HANDLE (MISCELLANEOUS) ×2 IMPLANT
DRAIN JACKSON RD 7FR 3/32 (WOUND CARE) IMPLANT
DRAIN SNY 10 ROU (WOUND CARE) ×2 IMPLANT
DRSG DRAWTEX TRACH 4X4 (GAUZE/BANDAGES/DRESSINGS) ×2 IMPLANT
ELECT COATED BLADE 2.86 ST (ELECTRODE) ×2 IMPLANT
ELECT PAIRED SUBDERMAL (MISCELLANEOUS) ×2
ELECT REM PT RETURN 9FT ADLT (ELECTROSURGICAL) ×2
ELECTRODE PAIRED SUBDERMAL (MISCELLANEOUS) ×1 IMPLANT
ELECTRODE REM PT RTRN 9FT ADLT (ELECTROSURGICAL) ×1 IMPLANT
EVACUATOR SILICONE 100CC (DRAIN) ×2 IMPLANT
FORCEPS BIPOLAR SPETZLER 8 1.0 (NEUROSURGERY SUPPLIES) ×2 IMPLANT
GAUZE 4X4 16PLY RFD (DISPOSABLE) ×4 IMPLANT
GLOVE BIO SURGEON STRL SZ7.5 (GLOVE) ×2 IMPLANT
GOWN STRL REUS W/ TWL LRG LVL3 (GOWN DISPOSABLE) ×2 IMPLANT
GOWN STRL REUS W/TWL LRG LVL3 (GOWN DISPOSABLE) ×2
HOLDER TRACH TUBE VELCRO 19.5 (MISCELLANEOUS) ×2 IMPLANT
KIT BASIN OR (CUSTOM PROCEDURE TRAY) ×2 IMPLANT
KIT TURNOVER KIT B (KITS) ×2 IMPLANT
NEEDLE HYPO 25GX1X1/2 BEV (NEEDLE) ×2 IMPLANT
NS IRRIG 1000ML POUR BTL (IV SOLUTION) ×2 IMPLANT
PAD ARMBOARD 7.5X6 YLW CONV (MISCELLANEOUS) ×4 IMPLANT
PENCIL BUTTON HOLSTER BLD 10FT (ELECTRODE) ×2 IMPLANT
POSITIONER HEAD DONUT 9IN (MISCELLANEOUS) ×2 IMPLANT
PROBE NERVBE PRASS .33 (MISCELLANEOUS) ×2 IMPLANT
SPONGE LAP 18X18 RF (DISPOSABLE) ×4 IMPLANT
STAPLER VISISTAT 35W (STAPLE) ×4 IMPLANT
SUT ETHILON 2 0 FS 18 (SUTURE) ×2 IMPLANT
SUT ETHILON 5 0 P 3 18 (SUTURE)
SUT NYLON ETHILON 5-0 P-3 1X18 (SUTURE) IMPLANT
SUT SILK 2 0 PERMA HAND 18 BK (SUTURE) ×2 IMPLANT
SUT SILK 2 0 SH CR/8 (SUTURE) ×2 IMPLANT
SUT SILK 3 0 REEL (SUTURE) ×2 IMPLANT
SUT VIC AB 3-0 SH 27 (SUTURE) ×7
SUT VIC AB 3-0 SH 27X BRD (SUTURE) ×7 IMPLANT
SUT VIC AB 4-0 PS2 27 (SUTURE) IMPLANT
TRAY ENT MC OR (CUSTOM PROCEDURE TRAY) ×2 IMPLANT

## 2019-01-21 NOTE — Transfer of Care (Signed)
Immediate Anesthesia Transfer of Care Note  Patient: MASANOBU COLAS  Procedure(s) Performed: REVISION OF PAROTIDECTOMY (Right )  Patient Location: PACU  Anesthesia Type:General  Level of Consciousness: awake, alert  and patient cooperative  Airway & Oxygen Therapy: Patient Spontanous Breathing and Patient connected to face mask oxygen  Post-op Assessment: Report given to RN and Post -op Vital signs reviewed and stable  Post vital signs: Reviewed and stable  Last Vitals:  Vitals Value Taken Time  BP 120/68 01/21/19 0952  Temp    Pulse 78 01/21/19 0952  Resp 12 01/21/19 0952  SpO2 100 % 01/21/19 0952  Vitals shown include unvalidated device data.  Last Pain:  Vitals:   01/21/19 0510  TempSrc: Oral  PainSc:       Patients Stated Pain Goal: 0 ([id number])  Complications: No apparent anesthesia complications

## 2019-01-21 NOTE — Anesthesia Postprocedure Evaluation (Signed)
Anesthesia Post Note  Patient: Anthony [name]  Procedure(s) Performed: REVISION OF PAROTIDECTOMY (Right )     Patient location during evaluation: PACU Anesthesia Type: General Level of consciousness: sedated Pain management: pain level controlled Vital Signs Assessment: post-procedure vital signs reviewed and stable Respiratory status: spontaneous breathing and respiratory function stable Cardiovascular status: stable Postop Assessment: no apparent nausea or vomiting Anesthetic complications: no    Last Vitals:  Vitals:   01/21/19 0952 01/21/19 1007  BP: 120/68 128/63  Pulse: 83 70  Resp: 12 15  Temp: 36.8 C 36.7 C  SpO2: 100% 100%    Last Pain:  Vitals:   01/21/19 1007  TempSrc:   PainSc: 0-No pain                 Amal Saiki DANIEL

## 2019-01-21 NOTE — Op Note (Signed)
NAME: Anthony [name], KITCHENS MEDICAL RECORD [id number] ACCOUNT [id number] DATE OF BIRTH:1940/09/20 FACILITY: [facility] LOCATION: [facility] PHYSICIAN:Anival Pasha Guido Sander, MD  OPERATIVE REPORT  DATE OF PROCEDURE:  01/21/2019  PREOPERATIVE DIAGNOSIS:  Right neck cancer.  POSTOPERATIVE DIAGNOSIS:  Right neck cancer.  PROCEDURE:  Revision right parotidectomy.  SURGEON:  Melida Quitter, MD  ANESTHESIA:  General endotracheal anesthesia.  COMPLICATIONS:  None.  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  He has remained in the hospital since then.  A deep margin  that was originally called negative by frozen section was reported positive by permanent section, so presents to the operating room for removal of additional deep tissue.  FINDINGS:  There was necrosis of the distal cervical pectoral flap under the ear as well as dehiscence of the incision superolaterally.  Upon lifting the flap, there was granulation tissue related to healing.  Dissection in the region of the stylomastoid  foramen was difficult.  This was due to bleeding and abnormal appearing tissue from healing.  Some deep masseteric muscle, as well as soft tissue from near the stylomastoid foramen was sent for new specimen.  The facial nerve was not visualized during  the case.  Nerve stimulator was used, but there was not stimulation seen of the nerve.  DESCRIPTION OF PROCEDURE:  The patient was identified in the holding room, informed consent having been obtained, including discussion of risks, benefits and alternatives, the patient was brought to the operative suite and table in supine position.   Anesthesia was induced and the patient's trach tube was exchanged for a cuffed endotracheal tube.  The eyes were taped closed and the right neck and chest were prepped and draped in sterile fashion.   Staples were removed from the dehiscent portions of  the wound as well as the neck portion.  A subcutaneous suture was also removed.  The necrotic portion of the cervical, pectoral flap was then taken off using a 15 blade scalpel.  The flap was then back elevated using Bovie electrocautery toward the  clavicle.  It was then sutured back.  The staples and sutures in the preauricular portion of the incision were also removed.  The earlobe and the anterior flap were sutured back.  Bleeding from the surgical site was then controlled with bipolar  electrocautery.  The soft tissues toward the sternal stylomastoid foramen were then retracted anteriorly allowing exposure of the deep margin region.  Some of the masseteric muscle was then removed and sent as a portion of the deep tissue.  Additional  tissue was removed from posterior to the mandible toward the stylomastoid foramen and also sent as a deep margin.  This constituted several pieces of tissue.  Bleeding was encountered in this process and had to be controlled with ligation and bipolar  electrocautery.  The nerve integrity monitor had been placed in the superior face at the beginning of the case and the nerve stimulator was then used to try to identify the nerve, but was not successful.  Dissection at the stylomastoid foramen itself was  felt to be not in the best interest of the patient and so it was left undisturbed.  At this point, with bleeding controlled, the wound was copiously irrigated with saline.  The posterior neck skin was back elevated using Bovie electrocautery.  The flap  was then laid back down and stay sutures removed.  The flap was then brought into position and secured to the surrounding incision using 3-0 Vicryl suture in a simple interrupted fashion.  The preauricular incision was also closed in this fashion.  There  was tension on the flap to get it closed.  A 10-French round drain was placed in the depths of the wound and brought out  the original drain site for the neck.  This was secured to the skin using 2-0 nylon suture and standard drain stitch.  After closing  the subcutaneous layer, staples were used to close the skin.  The drain was then placed to bulb suction and the drapes removed.  Bacitracin ointment was added to the wound after cleaning the patient off.  The drain was taped to the right shoulder.  He  was then returned to anesthesia for wakeup.  His trach tube was placed back in position prior to wake up and he was moved to recovery room in stable condition.  TN/NUANCE  D:01/21/2019 T:01/21/2019 JOB:[id number]

## 2019-01-21 NOTE — Anesthesia Procedure Notes (Signed)
Date/Time: 01/21/2019 7:49 AM Performed by: Babs Bertin, CRNA Pre-anesthesia Checklist: Patient identified, Emergency Drugs available, Suction available, Patient being monitored and Timeout performed Patient Re-evaluated:Patient Re-evaluated prior to induction Oxygen Delivery Method: Circle system utilized Preoxygenation: Pre-oxygenation with 100% oxygen Induction Type: IV induction Tube type: Reinforced (placed in trach stoma) Tube size: 6.5 mm Number of attempts: 1 Placement Confirmation: positive ETCO2 and breath sounds checked- equal and bilateral Tube secured with: Tape Dental Injury: Teeth and Oropharynx as per pre-operative assessment

## 2019-01-21 NOTE — Progress Notes (Signed)
Pt returned from [place] , made comfortable , bloody sputum from trach,alert and oriented , v/s stable.

## 2019-01-21 NOTE — Brief Op Note (Signed)
01/21/2019  9:38 AM  PATIENT:  Gwynneth Aliment  78 y.o. male  PRE-OPERATIVE DIAGNOSIS:  right neck cancer  POST-OPERATIVE DIAGNOSIS:  right neck cancer  PROCEDURE:  Procedure(s): REVISION OF PAROTIDECTOMY (Right)  SURGEON:  Surgeon(s) and Role:    Melida Quitter, MD - Primary  PHYSICIAN ASSISTANT:   ASSISTANTS: none   ANESTHESIA:   general  EBL: 150 cc  BLOOD ADMINISTERED:none  DRAINS: (10 Fr) Jackson-Pratt drain(s) with closed bulb suction in the right neck   LOCAL MEDICATIONS USED:  NONE  SPECIMEN:  Source of Specimen:  Deep right parotid tissue  DISPOSITION OF SPECIMEN:  PATHOLOGY  COUNTS:  YES  TOURNIQUET:  * No tourniquets in log *  DICTATION: .Other Dictation: Dictation Number [id number]  PLAN OF CARE: Return to progressive unit  PATIENT DISPOSITION:  PACU - hemodynamically stable.   Delay start of Pharmacological VTE agent (>24hrs) due to surgical blood loss or risk of bleeding: no

## 2019-01-21 NOTE — Progress Notes (Signed)
Consult PROGRESS NOTE  Anthony [name] [id number] DOB: 10/04/1940 DOA: 01/11/2019 PCP: Pablo Ledger, MD     Consulting physician: Dr. Redmond Baseman Reason for consult: Medical management  HPI/Recap of past 24 hours: Anthony Donna Smithis an 78 y.o.malesquamous cell carcinoma of right neck s/p radical neck dissection, parotidectomy and cervical pectoral flap, CKD stage III, COPD, dementia, depression, chronic bladder outlet obstruction and urinary retention, BPH s/p suprapubic catheter, restless leg syndrome, has chronic trach, PEG tube admitted after radical neck dissection secondary to underlying squamous cell carcinoma of right neck and fungating mass.  Patient developed fever 103 with leukocytosis on day 2 surgery so medicine consulted. Patient remained in the hospital with low blood pressures, febrile, anemia.  Temperature improved. Surgery planning repeat surgery for clear margins tomorrow.   01/21/19: Patient was seen and examined at his bedside post OP for revision of parotidectomy right.  Alert in no acute distress.  He has no new complaints.  Trach collar in place.   Assessment/Plan: Active Problems:   Anemia   Tobacco abuse   COPD (chronic obstructive pulmonary disease) (HCC)   CKD (chronic kidney disease) stage 3, GFR 30-59 ml/min   Dementia (HCC)   Tracheostomy in place (HCC)   Head and neck cancer (HCC)   Malnutrition of moderate degree   Depression   Suprapubic catheter (HCC)   Restless leg syndrome   Resolved postoperative fever: Likely UTI due to indwelling suprapubic catheter.  Catheter exchanged.  Patient did have tenderness and thick secretions  around the suprapubic tube. All cultures are negative.  Urine cultures with multiple organisms on initial collection.  Repeat cultures after antibiotics started negative.   Blood pressures adequately improved.   Procalcitonin and lactic acid were normal.   He finished antibiotic therapy.  Chronic kidney disease stage  III: On maintenance IV fluids.    On D5 half-normal saline at 75 cc/h.  Creatinine improving 1.6 with GFR of 40 on 01/21/2019 from 1.74 with GFR of 36 on 01/19/2019.  Continue to avoid nephrotoxins such as NSAIDs.  Urine output 4.5 L in the last 24 hours.  Acute on chronic anemia of chronic disease: Received 1 unit of PRBC with appropriate improvement. Could not tolerate iron.     Hemoglobin stable 8.2 on 01/21/2019 with MCV 95, from 7.7.  Dementia, deconditioning and impaired mobility: Has tracheostomy and functioning well. Continue chest physiotherapy.  Chest x-ray was normal. PEG tube infusing well.  Patient also allowed clear liquid diet. Other chronic medical issues including dementia, restless leg syndrome remained stable.  Recurrent head and neck cancer status post resection: He has wound dehiscence.  Surgery reported positive margins.  Since CT scan of the chest did not show any evidence of metastatic disease, ENT planning for reexploration, completed on 01/21/2019.  Dysphagia post PEG tube placement. Continue PEG tube feedings Maintain head of bed greater than 30 degrees angle Aspiration precautions  We will continue to follow with you.  DVT prophylaxis: SCDs Code Status: Full code Family Communication: None.  Patient has Education officer, [employer] at [employer] [employer] as guardian.  Surgery discussed with him. Disposition Plan: Anticipate discharge back to nursing home when stable from surgical standpoint.    Procedures:   Radical neck dissection and flap.  Antimicrobials:   Vancomycin, 01/14/2019-01/16/2019   cefepime, 01/14/2019----01/19/2019   Objective: Vitals:   01/21/19 0952 01/21/19 1007 01/21/19 1035 01/21/19 1100  BP: 120/68 128/63  116/72  Pulse: 83 70  77  Resp: 12 15  10  Temp: 98.2 F (36.8 C) 98.1 F (36.7 C) (!) 97.3 F (36.3 C) 98.7 F (37.1 C)  TempSrc:   Oral Oral  SpO2: 100% 100%  99%  Weight:      Height:        Intake/Output Summary (Last 24  hours) at 01/21/2019 1308 Last data filed at 01/21/2019 1035 Gross per 24 hour  Intake 1033.12 ml  Output 3959 ml  Net -2925.88 ml   Filed Weights   01/11/19 0717 01/18/19 1300 01/20/19 0500  Weight: 75.3 kg 81 kg 81.7 kg    Exam:  . General: 78 y.o. year-old male well developed well nourished in no acute distress.  Alert and interactive.  Trach collar in place. . Cardiovascular: Regular rate and rhythm with no rubs or gallops.  No thyromegaly or JVD noted.   Marland Kitchen Respiratory: Clear to auscultation with no wheezes or rales. Good inspiratory effort. . Abdomen: Soft nontender nondistended with normal bowel sounds x4 quadrants.  PEG tube in place. . Musculoskeletal: No lower extremity edema. 2/4 pulses in all 4 extremities. Marland Kitchen Psychiatry: Mood is appropriate for condition and setting   Data Reviewed: CBC: Recent Labs  Lab 01/16/19 0214 01/17/19 1355 01/18/19 0214 01/19/19 0217 01/21/19 0240  WBC 7.6 10.9* 7.5 6.0 7.3  NEUTROABS 5.0 9.2* 5.9 3.9 4.8  HGB 7.8* 7.0* 6.8* 7.7* 8.2*  HCT 25.2* 23.1* 22.6* 24.1* 26.1*  MCV 97.3 98.7 97.0 94.1 95.6  PLT 217 179 170 173 [id number]   Basic Metabolic Panel: Recent Labs  Lab 01/15/19 0233 01/16/19 0214 01/18/19 0214 01/19/19 0217 01/21/19 0240  NA 136 137 135 136 136  K 4.4 4.2 4.6 4.6 4.7  CL 110 110 110 112* 110  CO2 20* 20* 19* 19* 20*  GLUCOSE 176* 148* 153* 133* 151*  BUN 29* 28* 42* 41* 36*  CREATININE 1.66* 1.45* 1.77* 1.74* 1.60*  CALCIUM 7.6* 7.8* 7.4* 7.7* 7.9*  MG  --  2.1 1.9 2.0  --   PHOS  --  3.1 2.2* 2.3*  --    GFR: Estimated Creatinine Clearance: 42.6 mL/min (A) (by C-G formula based on SCr of 1.6 mg/dL (H)). Liver Function Tests: Recent Labs  Lab 01/18/19 0214  AST 18  ALT 16  ALKPHOS 64  BILITOT 0.4  PROT 5.8*  ALBUMIN 1.6*   No results for input(s): LIPASE, AMYLASE in the last 168 hours. No results for input(s): AMMONIA in the last 168 hours. Coagulation Profile: No results for input(s): INR, PROTIME  in the last 168 hours. Cardiac Enzymes: No results for input(s): CKTOTAL, CKMB, CKMBINDEX, TROPONINI in the last 168 hours. BNP (last 3 results) No results for input(s): PROBNP in the last 8760 hours. HbA1C: No results for input(s): HGBA1C in the last 72 hours. CBG: Recent Labs  Lab 01/16/19 1944 01/21/19 1108  GLUCAP 127* 124*   Lipid Profile: No results for input(s): CHOL, HDL, LDLCALC, TRIG, CHOLHDL, LDLDIRECT in the last 72 hours. Thyroid Function Tests: No results for input(s): TSH, T4TOTAL, FREET4, T3FREE, THYROIDAB in the last 72 hours. Anemia Panel: No results for input(s): VITAMINB12, FOLATE, FERRITIN, TIBC, IRON, RETICCTPCT in the last 72 hours. Urine analysis:    Component Value Date/Time   COLORURINE YELLOW 01/15/2019 1150   APPEARANCEUR CLEAR 01/15/2019 1150   LABSPEC 1.012 01/15/2019 1150   PHURINE 7.0 01/15/2019 1150   GLUCOSEU 50 (A) 01/15/2019 1150   HGBUR MODERATE (A) 01/15/2019 1150   BILIRUBINUR NEGATIVE 01/15/2019 [place] 01/15/2019 1150  PROTEINUR 30 (A) 01/15/2019 1150   UROBILINOGEN 1.0 12/23/2014 1853   NITRITE NEGATIVE 01/15/2019 1150   LEUKOCYTESUR SMALL (A) 01/15/2019 1150   Sepsis Labs: @LABRCNTIP (procalcitonin:4,lacticidven:4)  ) Recent Results (from the past 240 hour(s))  SARS CORONAVIRUS 2 (TAT 6-24 HRS) Nasopharyngeal Nasopharyngeal Swab     Status: None   Collection Time: 01/11/19  5:30 PM   Specimen: Nasopharyngeal Swab  Result Value Ref Range Status   SARS Coronavirus 2 NEGATIVE NEGATIVE Final    Comment: (NOTE) SARS-CoV-2 target nucleic acids are NOT DETECTED. The SARS-CoV-2 RNA is generally detectable in upper and lower respiratory specimens during the acute phase of infection. Negative results do not preclude SARS-CoV-2 infection, do not rule out co-infections with other pathogens, and should not be used as the sole basis for treatment or other patient management decisions. Negative results must be combined  with clinical observations, patient history, and epidemiological information. The expected result is Negative. Fact Sheet for Patients: SugarRoll.be Fact Sheet for Healthcare Providers: https://www.woods-mathews.com/ This test is not yet approved or cleared by the [place] FDA and  has been authorized for detection and/or diagnosis of SARS-CoV-2 by FDA under an Emergency Use Authorization (EUA). This EUA will remain  in effect (meaning this test can be used) for the duration of the COVID-19 declaration under Section 56 4(b)(1) of the Act, 21 U.S.C. section 360bbb-3(b)(1), unless the authorization is terminated or revoked sooner. Performed at [facility], [place] [address] [place], [place] [address]   MRSA PCR Screening     Status: Abnormal   Collection Time: 01/12/19 12:54 AM   Specimen: Nasopharyngeal  Result Value Ref Range Status   MRSA by PCR POSITIVE (A) NEGATIVE Final    Comment:        The GeneXpert MRSA Assay (FDA approved for NASAL specimens only), is one component of a comprehensive MRSA colonization surveillance program. It is not intended to diagnose MRSA infection nor to guide or monitor treatment for MRSA infections. RESULT CALLED TO, READ BACK BY AND VERIFIED WITH: NEWCOMER,S RN 01/12/2019 AT [id number] SKEEN,P Performed at [facility], [place] [address], [place], [place] [address]   Culture, Urine     Status: Abnormal   Collection Time: 01/14/19  2:00 PM   Specimen: Urine, Catheterized  Result Value Ref Range Status   Specimen Description URINE, CATHETERIZED  Final   Special Requests   Final    NONE Performed at [facility], [place] [address] [place], [place] [address]    Culture MULTIPLE SPECIES PRESENT, SUGGEST RECOLLECTION (A)  Final   Report Status 01/15/2019 FINAL  Final  Culture, blood (routine x 2)     Status: None   Collection Time: 01/14/19  3:44 PM   Specimen: BLOOD RIGHT HAND  Result  Value Ref Range Status   Specimen Description BLOOD RIGHT HAND  Final   Special Requests   Final    BOTTLES DRAWN AEROBIC ONLY Blood Culture adequate volume   Culture   Final    NO GROWTH 5 DAYS Performed at [facility], [place] [address] [place], [place] [address]    Report Status 01/19/2019 FINAL  Final  Culture, blood (routine x 2)     Status: None   Collection Time: 01/14/19  3:44 PM   Specimen: BLOOD RIGHT ARM  Result Value Ref Range Status   Specimen Description BLOOD RIGHT ARM  Final   Special Requests   Final    BOTTLES DRAWN AEROBIC AND  ANAEROBIC Blood Culture results may not be optimal due to an excessive volume of blood received in culture bottles   Culture   Final    NO GROWTH 5 DAYS Performed at [place] [address], [place], [place] [address]    Report Status 01/19/2019 FINAL  Final  Culture, Urine     Status: None   Collection Time: 01/15/19 11:51 AM   Specimen: Urine, Suprapubic  Result Value Ref Range Status   Specimen Description URINE, SUPRAPUBIC  Final   Special Requests NONE  Final   Culture   Final    NO GROWTH Performed at [facility], [place] [address], [place], [place] [address]    Report Status 01/16/2019 FINAL  Final  Culture, blood (routine x 2)     Status: None (Preliminary result)   Collection Time: 01/17/19  4:23 PM   Specimen: BLOOD RIGHT ARM  Result Value Ref Range Status   Specimen Description BLOOD RIGHT ARM  Final   Special Requests   Final    BOTTLES DRAWN AEROBIC ONLY Blood Culture adequate volume   Culture   Final    NO GROWTH 4 DAYS Performed at [facility], [place] [address], [place], [place] [address]    Report Status PENDING  Incomplete  Culture, blood (routine x 2)     Status: None (Preliminary result)   Collection Time: 01/17/19  4:23 PM   Specimen: BLOOD RIGHT ARM  Result Value Ref Range Status   Specimen Description BLOOD RIGHT ARM  Final   Special Requests   Final    BOTTLES DRAWN AEROBIC ONLY  Blood Culture adequate volume   Culture   Final    NO GROWTH 4 DAYS Performed at [facility], [place] [address], [place], [place] [address]    Report Status PENDING  Incomplete      Studies: No results found.  Scheduled Meds: . sodium chloride   Intravenous Once  . artificial tears   Right Eye QHS  . bacitracin  1 application Topical [id number]  . chlorhexidine  15 mL Mouth Rinse BID  . feeding supplement  1 Container Oral TID BM  . feeding supplement (PRO-STAT SUGAR FREE 64)  30 mL Per Tube BID  . free water  200 mL Per Tube Q8H  . gabapentin  100 mg Per Tube TID  . mouth rinse  15 mL Mouth Rinse q12n4p  . Melatonin  6 mg Oral QHS  . memantine  5 mg Oral Daily  . multivitamin with minerals  1 tablet Oral Daily  . pantoprazole  40 mg Oral Daily  . potassium & sodium phosphates  1 packet Oral TID WC & HS  . pramipexole  0.125 mg Oral QHS  . sertraline  50 mg Oral Daily    Continuous Infusions: . dextrose 5 % and 0.45 % NaCl with KCl 20 mEq/L 75 mL/hr at 01/21/19 0512  . feeding supplement (OSMOLITE 1.5 CAL) Stopped (01/21/19 0009)     LOS: 10 days     Kayleen Memos, MD [facility] Pager [phone]  If 7PM-7AM, please contact night-coverage [email or web address] Password [email or web address] 01/21/2019, 1:08 PM

## 2019-01-22 ENCOUNTER — Encounter (HOSPITAL_COMMUNITY): Payer: Self-pay | Admitting: Otolaryngology

## 2019-01-22 LAB — BASIC METABOLIC PANEL
Anion gap: 9 (ref 5–15)
BUN: 34 mg/dL — ABNORMAL HIGH (ref 8–23)
CO2: 20 mmol/L — ABNORMAL LOW (ref 22–32)
Calcium: 7.8 mg/dL — ABNORMAL LOW (ref 8.9–10.3)
Chloride: 107 mmol/L (ref 98–111)
Creatinine, Ser: 1.64 mg/dL — ABNORMAL HIGH (ref 0.61–1.24)
GFR calc Af Amer: 45 mL/min — ABNORMAL LOW (ref >60)
GFR calc non Af Amer: 39 mL/min — ABNORMAL LOW (ref >60)
Glucose, Bld: 138 mg/dL — ABNORMAL HIGH (ref 70–99)
Potassium: 4.6 mmol/L (ref 3.5–5.1)
Sodium: 136 mmol/L (ref 135–145)

## 2019-01-22 LAB — CBC
HCT: 25.8 % — ABNORMAL LOW (ref 39.0–52.0)
Hemoglobin: 8 g/dL — ABNORMAL LOW (ref 13.0–17.0)
MCH: 29.3 pg (ref 26.0–34.0)
MCHC: 31 g/dL (ref 30.0–36.0)
MCV: 94.5 fL (ref 80.0–100.0)
Platelets: 274 10*3/uL (ref 150–400)
RBC: 2.73 MIL/uL — ABNORMAL LOW (ref 4.22–5.81)
RDW: 15 % (ref 11.5–15.5)
WBC: 9.6 10*3/uL (ref 4.0–10.5)
nRBC: 0 % (ref 0.0–0.2)

## 2019-01-22 LAB — CULTURE, BLOOD (ROUTINE X 2)
Culture: NO GROWTH
Culture: NO GROWTH
Special Requests: ADEQUATE
Special Requests: ADEQUATE

## 2019-01-22 NOTE — Progress Notes (Signed)
Physical Therapy Treatment Patient Details Name: Anthony [name] MRN: [id number] DOB: October 01, 1940 Today's Date: 01/22/2019    History of Present Illness Anthony [name] is an 78 y.o. male with squamous cell carcinoma of right neck s/p radical neck dissection, parotidectomy and cervical pectoral flap. PMH includes CKD stage III, COPD, dementia, depression, chronic bladder outlet obstruction and urinary retention, BPH s/p suprapubic catheter, restless leg syndrome, chronic trach, PEG tube admitted for radical neck dissection secondary to underlying squamous cell carcinoma of right neck. Pt underwent R parotidectomy revision on 11/8.    PT Comments    Pt demonstrates improved functional mobility, strength, and power during session, requiring reduced physical assistance in all aspects of mobility. Pt able to progress to gait training during session although limited due to fatigue. Pt continues to demonstrate reduced LE power, resulting in continued assistance requirements for transfers. Pt will benefit from continued acute PT services to reduce falls risk and restore his PLOF.   Follow Up Recommendations  SNF;Supervision for mobility/OOB     Equipment Recommendations  None recommended by PT    Recommendations for Other Services       Precautions / Restrictions Precautions Precautions: Fall Precaution Comments: trach collar 28% 5L 02, JP drain, PEG tube Restrictions Weight Bearing Restrictions: No    Mobility  Bed Mobility Overal bed mobility: Needs Assistance Bed Mobility: Supine to Sit     Supine to sit: Min assist;HOB elevated        Transfers Overall transfer level: Needs assistance Equipment used: Rolling walker (2 wheeled) Transfers: Sit to/from Stand Sit to Stand: Min assist;From elevated surface            Ambulation/Gait Ambulation/Gait assistance: Min Web designer (Feet): 5 Feet Assistive device: Rolling walker (2 wheeled) Gait Pattern/deviations:  Step-to pattern;Shuffle;Trunk flexed Gait velocity: reduced Gait velocity interpretation: <1.31 ft/sec, indicative of household ambulator General Gait Details: slow step to gait with flexed trunk, pt fatigues quickly   Stairs             Wheelchair Mobility    Modified Rankin (Stroke Patients Only)       Balance Overall balance assessment: Needs assistance Sitting-balance support: Single extremity supported;Feet supported Sitting balance-Leahy Scale: Fair Sitting balance - Comments: minG-close supervision   Standing balance support: Bilateral upper extremity supported Standing balance-Leahy Scale: Fair Standing balance comment: minA for static standing with BUE support                            Cognition Arousal/Alertness: Awake/alert Behavior During Therapy: WFL for tasks assessed/performed;Flat affect Overall Cognitive Status: Within Functional Limits for tasks assessed                                        Exercises      General Comments General comments (skin integrity, edema, etc.): VSS during session, pt able to ambulate short distance off trach collar without desaturating. Pt on 5L 28% FiO2 trach collar.      Pertinent Vitals/Pain Pain Assessment: No/denies pain    Home Living                      Prior Function            PT Goals (current goals can now be found in the care plan section) Acute Rehab PT Goals Patient  Stated Goal: To return to prior level of function Progress towards PT goals: Progressing toward goals    Frequency    Min 2X/week      PT Plan Current plan remains appropriate    Co-evaluation              AM-PAC PT "6 Clicks" Mobility   Outcome Measure  Help needed turning from your back to your side while in a flat bed without using bedrails?: A Little Help needed moving from lying on your back to sitting on the side of a flat bed without using bedrails?: A Little Help needed  moving to and from a bed to a chair (including a wheelchair)?: A Little Help needed standing up from a chair using your arms (e.g., wheelchair or bedside chair)?: A Little Help needed to walk in hospital room?: A Little Help needed climbing 3-5 steps with a railing? : Total 6 Click Score: 16    End of Session Equipment Utilized During Treatment: Oxygen Activity Tolerance: Patient tolerated treatment well Patient left: in chair;with call bell/phone within reach Nurse Communication: Mobility status PT Visit Diagnosis: Pain;Muscle weakness (generalized) (M62.81);Dizziness and giddiness (R42)     Time: [id number] PT Time Calculation (min) (ACUTE ONLY): 30 min  Charges:  $Therapeutic Activity: 23-37 mins                     Zenaida Niece, PT, DPT Acute Rehabilitation Pager: [phone]    Zenaida Niece 01/22/2019, 11:12 AM

## 2019-01-22 NOTE — Plan of Care (Signed)

## 2019-01-22 NOTE — Plan of Care (Signed)
  Problem: Coping: Goal: Level of anxiety will decrease Outcome: Progressing   Problem: Nutrition: Goal: Adequate nutrition will be maintained Outcome: Progressing   Problem: Pain Managment: Goal: General experience of comfort will improve Outcome: Progressing   Problem: Safety: Goal: Ability to remain free from injury will improve Outcome: Progressing   Problem: Skin Integrity: Goal: Risk for impaired skin integrity will decrease Outcome: Progressing

## 2019-01-22 NOTE — Progress Notes (Signed)
   Subjective:    Patient ID: Anthony [name], male    DOB: November 16, 1940, 78 y.o.   MRN: [id number]  HPI Doing well.  No complaints.  Seems more himself today.  Review of Systems     Objective:   Physical Exam AF VSS  Drain 89 cc Alert, NAD Right neck/chest incision clean and intact.  Skin flap looks good.  Drain not holding suction, hooked to wall suction. Right facial movement remains weak. WBC 9.6  Hgb 8.0    Assessment & Plan:  Right neck cancer s/p parotidectomy, neck dissection, cervicopectoral flap and s/p revision yesterday  Looks good today.  Changed drain to wall suction.  Continue progress toward discharge, possibly late this week.

## 2019-01-22 NOTE — Progress Notes (Signed)
Consult PROGRESS NOTE  Anthony [name] [id number] DOB: 14-Mar-1941 DOA: 01/11/2019 PCP: Pablo Ledger, MD     Consulting physician: Dr. Redmond Baseman Reason for consult: Medical management  HPI/Recap of past 24 hours: Anthony Donna Smithis an 78 y.o.malesquamous cell carcinoma of right neck s/p radical neck dissection, parotidectomy and cervical pectoral flap, CKD stage III, COPD, dementia, depression, chronic bladder outlet obstruction and urinary retention, BPH s/p suprapubic catheter, restless leg syndrome, has chronic trach, PEG tube admitted after radical neck dissection secondary to underlying squamous cell carcinoma of right neck and fungating mass.  Patient developed fever 103 with leukocytosis on day 2 surgery so medicine consulted. Patient remained in the hospital with low blood pressures, febrile, anemia.  Temperature improved. Surgery planning repeat surgery for clear margins tomorrow.   01/22/19: Patient was seen and examined at his bedside this morning.  No acute events overnight.  He had no complaint this morning.  Trach collar in place, no acute distress.  PEG tube in place.   Assessment/Plan: Active Problems:   Anemia   Tobacco abuse   COPD (chronic obstructive pulmonary disease) (HCC)   CKD (chronic kidney disease) stage 3, GFR 30-59 ml/min   Dementia (HCC)   Tracheostomy in place ([place])   Head and neck cancer (HCC)   Malnutrition of moderate degree   Depression   Suprapubic catheter (HCC)   Restless leg syndrome   Resolved postoperative fever: Likely presumed UTI due to indwelling suprapubic catheter.  Catheter exchanged.  Patient did have tenderness and thick secretions  around the suprapubic tube. All cultures are negative.  Urine cultures with multiple organisms on initial collection.  Repeat cultures after antibiotics started negative.   Blood pressures adequately improved.   Procalcitonin and lactic acid were normal.   He finished antibiotic therapy.  Chronic  kidney disease stage III: On maintenance IV fluids.    On D5 half-normal saline at 75 cc/h.  Creatinine 1.64 with GFR 39.  Continue to avoid nephrotoxins such as NSAIDs.  2.5L U/O in the last 24h  Acute on chronic anemia of chronic disease: Received 1 unit of PRBC with appropriate improvement. Could not tolerate iron.     Globin stable 8.0 on 01/22/19 from hemoglobin 8.2 on 01/21/2019 with MCV 95, from 7.7.  Dementia, deconditioning and impaired mobility: Has tracheostomy and functioning well.   Chest x-ray was normal. PEG tube infusing well.  Patient also allowed clear liquid diet. Other chronic medical issues including dementia, restless leg syndrome remained stable. PT assessed and recommended SNF Continue PT OT with fall precautions  Right neck cancer status post parotidectomy, neck dissection, cervical pectoral flap and status post revision on 01/21/2019 Stable per primary Drain to wall suction  Dysphagia post PEG tube placement. Continue PEG tube feedings Maintain head of bed greater than 30 degrees angle Aspiration precautions  Thank you for allowing [place] to participate in the care of this patient.  We will continue to follow with you.  DVT prophylaxis: SCDs Code Status: Full code    Procedures:  Right neck cancer status post parotidectomy, neck dissection, cervical pectoral flap and status post revision on 01/21/2019  Antimicrobials:   Vancomycin, 01/14/2019-01/16/2019   cefepime, 01/14/2019----01/19/2019   Objective: Vitals:   01/22/19 0731 01/22/19 0830 01/22/19 1109 01/22/19 1410  BP: (!) 107/57  106/74   Pulse: 74 79 79 84  Resp: 20 20 16 19   Temp:   97.7 F (36.5 C)   TempSrc:   Oral   SpO2: 98%  98%   Weight:      Height:        Intake/Output Summary (Last 24 hours) at 01/22/2019 1432 Last data filed at 01/22/2019 0500 Gross per 24 hour  Intake 2150 ml  Output 2155 ml  Net -5 ml   Filed Weights   01/18/19 1300 01/20/19 0500 01/22/19 0500  Weight: 81  kg 81.7 kg 79.8 kg    Exam:  . General: 78 y.o. year-old male pleasant well-developed well-nourished no acute distress.  Alert and interactive.  Trach collar in place. . Cardiovascular: Regular rate and rhythm no rubs or gallops.  Respiratory: Clear to auscultation with no wheezes or rales. . Abdomen: Soft nontender normal bowel sounds present.  PEG tube in place. . Musculoskeletal: Trace lower extremity edema.   Marland Kitchen Psychiatry: Mood is appropriate for condition and setting.   Data Reviewed: CBC: Recent Labs  Lab 01/16/19 0214 01/17/19 1355 01/18/19 0214 01/19/19 0217 01/21/19 0240 01/22/19 0725  WBC 7.6 10.9* 7.5 6.0 7.3 9.6  NEUTROABS 5.0 9.2* 5.9 3.9 4.8  --   HGB 7.8* 7.0* 6.8* 7.7* 8.2* 8.0*  HCT 25.2* 23.1* 22.6* 24.1* 26.1* 25.8*  MCV 97.3 98.7 97.0 94.1 95.6 94.5  PLT 217 179 170 173 216 [id number]   Basic Metabolic Panel: Recent Labs  Lab 01/16/19 0214 01/18/19 0214 01/19/19 0217 01/21/19 0240 01/22/19 0725  NA 137 135 136 136 136  K 4.2 4.6 4.6 4.7 4.6  CL 110 110 112* 110 107  CO2 20* 19* 19* 20* 20*  GLUCOSE 148* 153* 133* 151* 138*  BUN 28* 42* 41* 36* 34*  CREATININE 1.45* 1.77* 1.74* 1.60* 1.64*  CALCIUM 7.8* 7.4* 7.7* 7.9* 7.8*  MG 2.1 1.9 2.0  --   --   PHOS 3.1 2.2* 2.3*  --   --    GFR: Estimated Creatinine Clearance: 40.5 mL/min (A) (by C-G formula based on SCr of 1.64 mg/dL (H)). Liver Function Tests: Recent Labs  Lab 01/18/19 0214  AST 18  ALT 16  ALKPHOS 64  BILITOT 0.4  PROT 5.8*  ALBUMIN 1.6*   No results for input(s): LIPASE, AMYLASE in the last 168 hours. No results for input(s): AMMONIA in the last 168 hours. Coagulation Profile: No results for input(s): INR, PROTIME in the last 168 hours. Cardiac Enzymes: No results for input(s): CKTOTAL, CKMB, CKMBINDEX, TROPONINI in the last 168 hours. BNP (last 3 results) No results for input(s): PROBNP in the last 8760 hours. HbA1C: No results for input(s): HGBA1C in the last 72 hours.  CBG: Recent Labs  Lab 01/16/19 1944 01/21/19 1108  GLUCAP 127* 124*   Lipid Profile: No results for input(s): CHOL, HDL, LDLCALC, TRIG, CHOLHDL, LDLDIRECT in the last 72 hours. Thyroid Function Tests: No results for input(s): TSH, T4TOTAL, FREET4, T3FREE, THYROIDAB in the last 72 hours. Anemia Panel: No results for input(s): VITAMINB12, FOLATE, FERRITIN, TIBC, IRON, RETICCTPCT in the last 72 hours. Urine analysis:    Component Value Date/Time   COLORURINE YELLOW 01/15/2019 1150   APPEARANCEUR CLEAR 01/15/2019 1150   LABSPEC 1.012 01/15/2019 1150   PHURINE 7.0 01/15/2019 1150   GLUCOSEU 50 (A) 01/15/2019 1150   HGBUR MODERATE (A) 01/15/2019 1150   BILIRUBINUR NEGATIVE 01/15/2019 1150   KETONESUR NEGATIVE 01/15/2019 1150   PROTEINUR 30 (A) 01/15/2019 1150   UROBILINOGEN 1.0 12/23/2014 1853   NITRITE NEGATIVE 01/15/2019 1150   LEUKOCYTESUR SMALL (A) 01/15/2019 1150   Sepsis Labs: @LABRCNTIP (procalcitonin:4,lacticidven:4)  ) Recent Results (from the past 240 hour(s))  Culture, Urine     Status: Abnormal   Collection Time: 01/14/19  2:00 PM   Specimen: Urine, Catheterized  Result Value Ref Range Status   Specimen Description URINE, CATHETERIZED  Final   Special Requests   Final    NONE Performed at [facility], [address], [place], [place] [address]    Culture MULTIPLE SPECIES PRESENT, SUGGEST RECOLLECTION (A)  Final   Report Status 01/15/2019 FINAL  Final  Culture, blood (routine x 2)     Status: None   Collection Time: 01/14/19  3:44 PM   Specimen: BLOOD RIGHT HAND  Result Value Ref Range Status   Specimen Description BLOOD RIGHT HAND  Final   Special Requests   Final    BOTTLES DRAWN AEROBIC ONLY Blood Culture adequate volume   Culture   Final    NO GROWTH 5 DAYS Performed at [facility], [place] [address] [place], [place] [address]    Report Status 01/19/2019 FINAL  Final  Culture, blood (routine x 2)     Status: None   Collection Time:  01/14/19  3:44 PM   Specimen: BLOOD RIGHT ARM  Result Value Ref Range Status   Specimen Description BLOOD RIGHT ARM  Final   Special Requests   Final    BOTTLES DRAWN AEROBIC AND ANAEROBIC Blood Culture results may not be optimal due to an excessive volume of blood received in culture bottles   Culture   Final    NO GROWTH 5 DAYS Performed at [facility], [place] [address] [place], [place] [address]    Report Status 01/19/2019 FINAL  Final  Culture, Urine     Status: None   Collection Time: 01/15/19 11:51 AM   Specimen: Urine, Suprapubic  Result Value Ref Range Status   Specimen Description URINE, SUPRAPUBIC  Final   Special Requests NONE  Final   Culture   Final    NO GROWTH Performed at [facility], [place] [address] [place], [place] [address]    Report Status 01/16/2019 FINAL  Final  Culture, blood (routine x 2)     Status: None   Collection Time: 01/17/19  4:23 PM   Specimen: BLOOD RIGHT ARM  Result Value Ref Range Status   Specimen Description BLOOD RIGHT ARM  Final   Special Requests   Final    BOTTLES DRAWN AEROBIC ONLY Blood Culture adequate volume   Culture   Final    NO GROWTH 5 DAYS Performed at [facility], [place] [address] [place], [place] [address]    Report Status 01/22/2019 FINAL  Final  Culture, blood (routine x 2)     Status: None   Collection Time: 01/17/19  4:23 PM   Specimen: BLOOD RIGHT ARM  Result Value Ref Range Status   Specimen Description BLOOD RIGHT ARM  Final   Special Requests   Final    BOTTLES DRAWN AEROBIC ONLY Blood Culture adequate volume   Culture   Final    NO GROWTH 5 DAYS Performed at [facility], [place] [address], [place], [place] [address]    Report Status 01/22/2019 FINAL  Final      Studies: No results found.  Scheduled Meds: . sodium chloride   Intravenous Once  . artificial tears   Right Eye QHS  . bacitracin  1 application Topical Q000111Q  . chlorhexidine  15 mL Mouth Rinse BID  . feeding  supplement  1 Container Oral  TID BM  . feeding supplement (PRO-STAT SUGAR FREE 64)  30 mL Per Tube BID  . free water  200 mL Per Tube Q8H  . gabapentin  100 mg Per Tube TID  . mouth rinse  15 mL Mouth Rinse q12n4p  . Melatonin  6 mg Oral QHS  . memantine  5 mg Oral Daily  . multivitamin  15 mL Oral Daily  . pantoprazole sodium  40 mg Per Tube Daily  . potassium & sodium phosphates  1 packet Oral TID WC & HS  . pramipexole  0.125 mg Oral QHS  . sertraline  50 mg Oral Daily    Continuous Infusions: . dextrose 5 % and 0.45 % NaCl with KCl 20 mEq/L 75 mL/hr at 01/21/19 0512  . feeding supplement (OSMOLITE 1.5 CAL) 50 mL/hr at 01/22/19 0400     LOS: 11 days     Kayleen Memos, MD [facility] Pager [phone]  If 7PM-7AM, please contact night-coverage [email or web address] Password [facility] 01/22/2019, 2:32 PM

## 2019-01-23 LAB — SARS CORONAVIRUS 2 (TAT 6-24 HRS): SARS Coronavirus 2: NEGATIVE

## 2019-01-23 LAB — CREATININE, SERUM
Creatinine, Ser: 1.68 mg/dL — ABNORMAL HIGH (ref 0.61–1.24)
GFR calc Af Amer: 44 mL/min — ABNORMAL LOW (ref >60)
GFR calc non Af Amer: 38 mL/min — ABNORMAL LOW (ref >60)

## 2019-01-23 MED ORDER — POLYVINYL ALCOHOL 1.4 % OP SOLN
1.0000 [drp] | OPHTHALMIC | Status: DC
  Administered 2019-01-23 – 2019-01-25 (×9): 1 [drp] via OPHTHALMIC

## 2019-01-23 MED ORDER — ACETAMINOPHEN 160 MG/5ML PO SOLN
650.0000 mg | ORAL | Status: DC | PRN
  Administered 2019-01-23 (×2): 650 mg
  Filled 2019-01-23: qty 20.3

## 2019-01-23 MED ORDER — FERROUS SULFATE 300 (60 FE) MG/5ML PO SYRP
300.0000 mg | ORAL_SOLUTION | Freq: Every day | ORAL | Status: DC
  Administered 2019-01-24 – 2019-01-25 (×2): 300 mg
  Filled 2019-01-23 (×2): qty 5

## 2019-01-23 MED ORDER — SODIUM CHLORIDE 0.9 % IV SOLN
510.0000 mg | Freq: Once | INTRAVENOUS | Status: AC
  Administered 2019-01-23: 12:00:00 510 mg via INTRAVENOUS
  Filled 2019-01-23: qty 17

## 2019-01-23 NOTE — Progress Notes (Signed)
Pt attempted to get out of chair says wanted to look outside to see what time it was and fell on floor. No injuries noted, found pt sitting on his bottom on floor. Assisted pt back to bed.  Paged calling service [phone] for dr on call waiting on callback. Vital signs stable HR 73, Resp 16, bp 114/80, sats 100% on trach collar. New orders for neuro checks every 2 hours, and to monitor overnight if any changes to notify Dr. Blenda Nicely.

## 2019-01-23 NOTE — Plan of Care (Signed)

## 2019-01-23 NOTE — Progress Notes (Signed)
   Subjective:    Patient ID: Anthony [name], male    DOB: 1940-10-08, 78 y.o.   MRN: [id number]  HPI No complaints.  Review of Systems     Objective:   Physical Exam AF VSS Alert, NAD Right neck/chest incision clean, slight dehiscence superiorly.  Drain holding suction. Right facial weakness.     Assessment & Plan:  Right neck cancer s/p parotidectomy, neck dissection, and cervicopectoral flap  Doing well.  Drain back to bulb suction, likely removal tomorrow.  Wound care, slight dehiscence.  Likely discharge to nursing facility later this week.

## 2019-01-23 NOTE — Progress Notes (Signed)
Consult PROGRESS NOTE  Anthony [name] [id number] DOB: 10/30/1940 DOA: 01/11/2019 PCP: Anthony Ledger, MD     Consulting physician: Dr. Redmond Baseman Reason for consult: Medical management  HPI/Recap of past 24 hours: Anthony Donna Smithis an 78 y.o.malesquamous cell carcinoma of right neck s/p radical neck dissection, parotidectomy and cervical pectoral flap, CKD stage III, COPD, dementia, depression, chronic bladder outlet obstruction and urinary retention, BPH s/p suprapubic catheter, restless leg syndrome, has chronic trach, PEG tube admitted after radical neck dissection secondary to underlying squamous cell carcinoma of right neck and fungating mass.  Patient developed fever 103 with leukocytosis on day 2 surgery so medicine consulted. Patient remained in the hospital with low blood pressures, febrile, anemia.  Temperature improved. Surgery planning repeat surgery for clear margins tomorrow.   01/23/19: Patient was seen and examined at his bedside this morning.  No acute events overnight.  No new complaints.    Assessment/Plan: Active Problems:   Anemia   Tobacco abuse   COPD (chronic obstructive pulmonary disease) (HCC)   CKD (chronic kidney disease) stage 3, GFR 30-59 ml/min   Dementia (HCC)   Tracheostomy in place ([place])   Head and neck cancer (HCC)   Malnutrition of moderate degree   Depression   Suprapubic catheter (HCC)   Restless leg syndrome   Resolved postoperative fever: Likely presumed UTI due to indwelling suprapubic catheter.  Catheter exchanged.  Patient did have tenderness and thick secretions  around the suprapubic tube. All cultures are negative.  Urine cultures with multiple organisms on initial collection.  Repeat cultures after antibiotics started negative.   Blood pressures adequately improved.   Procalcitonin and lactic acid were normal.   He finished antibiotic therapy.  Chronic kidney disease stage III: On maintenance IV fluids.    On D5 half-normal  saline at 75 cc/h.  Creatinine 1.64 with GFR 39.  Continue to avoid nephrotoxins such as NSAIDs.  2.5L U/O in the last 24h  Iron deficiency anemia/anemia of chronic disease Iron studies done In 120 suggestive of iron deficiency Ordered IV Feraheme 510 mg once Continue ferrous sulfate daily via PEG tube Received 1 unit of PRBC with appropriate improvement. Could not tolerate iron.     Globin stable 8.0 on 01/22/19 from hemoglobin 8.2 on 01/21/2019 with MCV 95, from 7.7.  Dementia, deconditioning and impaired mobility: Has tracheostomy and functioning well.   Chest x-ray was normal. PEG tube infusing well.  Patient also allowed clear liquid diet. Other chronic medical issues including dementia, restless leg syndrome remained stable. PT assessed and recommended SNF Continue PT OT with fall precautions  Right neck cancer status post parotidectomy, neck dissection, cervical pectoral flap and status post revision on 01/21/2019 Stable per primary Drain to wall suction  Dysphagia post PEG tube placement. Continue PEG tube feedings Maintain head of bed greater than 30 degrees angle Aspiration precautions  Thank you for allowing [place] to participate in the care of this patient.  We will continue to follow with you.  DVT prophylaxis: SCDs Code Status: Full code    Procedures:  Right neck cancer status post parotidectomy, neck dissection, cervical pectoral flap and status post revision on 01/21/2019  Antimicrobials:   Vancomycin, 01/14/2019-01/16/2019   cefepime, 01/14/2019----01/19/2019   Objective: Vitals:   01/23/19 0742 01/23/19 0828 01/23/19 0829 01/23/19 1300  BP: (!) 105/56 (!) 99/54 (!) 96/51 116/80  Pulse:  71 71 71  Resp:  19 18 18   Temp:  98.5 F (36.9 C) 98.5 F (36.9 C)  98.4 F (36.9 C)  TempSrc:  Axillary Axillary Axillary  SpO2:  98% 100% 98%  Weight:      Height:        Intake/Output Summary (Last 24 hours) at 01/23/2019 1603 Last data filed at 01/23/2019 1349  Gross per 24 hour  Intake 3200 ml  Output 2640 ml  Net 560 ml   Filed Weights   01/18/19 1300 01/20/19 0500 01/22/19 0500  Weight: 81 kg 81.7 kg 79.8 kg    Exam:  . General: 78 y.o. year-old male pleasant well-developed in no acute distress.  Alert and interactive.  Trach collar in place. . Cardiovascular: Regular rate and rhythm no rubs or gallops.  Respiratory: Clear to Auscultation with No Wheezes or Rales. . Abdomen: Soft nontender nondistended normal bowel sounds.  PEG tube in place.   . Musculoskeletal: No lower extremity edema.  2/4 pulses in all 4 extremities. Marland Kitchen Psychiatry: Mood is appropriate for condition and setting.   Data Reviewed: CBC: Recent Labs  Lab 01/17/19 1355 01/18/19 0214 01/19/19 0217 01/21/19 0240 01/22/19 0725  WBC 10.9* 7.5 6.0 7.3 9.6  NEUTROABS 9.2* 5.9 3.9 4.8  --   HGB 7.0* 6.8* 7.7* 8.2* 8.0*  HCT 23.1* 22.6* 24.1* 26.1* 25.8*  MCV 98.7 97.0 94.1 95.6 94.5  PLT 179 170 173 216 [id number]   Basic Metabolic Panel: Recent Labs  Lab 01/18/19 0214 01/19/19 0217 01/21/19 0240 01/22/19 0725 01/23/19 0533  NA 135 136 136 136  --   K 4.6 4.6 4.7 4.6  --   CL 110 112* 110 107  --   CO2 19* 19* 20* 20*  --   GLUCOSE 153* 133* 151* 138*  --   BUN 42* 41* 36* 34*  --   CREATININE 1.77* 1.74* 1.60* 1.64* 1.68*  CALCIUM 7.4* 7.7* 7.9* 7.8*  --   MG 1.9 2.0  --   --   --   PHOS 2.2* 2.3*  --   --   --    GFR: Estimated Creatinine Clearance: 39.6 mL/min (A) (by C-G formula based on SCr of 1.68 mg/dL (H)). Liver Function Tests: Recent Labs  Lab 01/18/19 0214  AST 18  ALT 16  ALKPHOS 64  BILITOT 0.4  PROT 5.8*  ALBUMIN 1.6*   No results for input(s): LIPASE, AMYLASE in the last 168 hours. No results for input(s): AMMONIA in the last 168 hours. Coagulation Profile: No results for input(s): INR, PROTIME in the last 168 hours. Cardiac Enzymes: No results for input(s): CKTOTAL, CKMB, CKMBINDEX, TROPONINI in the last 168 hours. BNP (last 3  results) No results for input(s): PROBNP in the last 8760 hours. HbA1C: No results for input(s): HGBA1C in the last 72 hours. CBG: Recent Labs  Lab 01/16/19 1944 01/21/19 1108  GLUCAP 127* 124*   Lipid Profile: No results for input(s): CHOL, HDL, LDLCALC, TRIG, CHOLHDL, LDLDIRECT in the last 72 hours. Thyroid Function Tests: No results for input(s): TSH, T4TOTAL, FREET4, T3FREE, THYROIDAB in the last 72 hours. Anemia Panel: No results for input(s): VITAMINB12, FOLATE, FERRITIN, TIBC, IRON, RETICCTPCT in the last 72 hours. Urine analysis:    Component Value Date/Time   COLORURINE YELLOW 01/15/2019 1150   APPEARANCEUR CLEAR 01/15/2019 1150   LABSPEC 1.012 01/15/2019 1150   PHURINE 7.0 01/15/2019 1150   GLUCOSEU 50 (A) 01/15/2019 1150   HGBUR MODERATE (A) 01/15/2019 1150   BILIRUBINUR NEGATIVE 01/15/2019 1150   KETONESUR NEGATIVE 01/15/2019 1150   PROTEINUR 30 (A) 01/15/2019 1150   UROBILINOGEN  1.0 12/23/2014 1853   NITRITE NEGATIVE 01/15/2019 1150   LEUKOCYTESUR SMALL (A) 01/15/2019 1150   Sepsis Labs: @LABRCNTIP (procalcitonin:4,lacticidven:4)  ) Recent Results (from the past 240 hour(s))  Culture, Urine     Status: Abnormal   Collection Time: 01/14/19  2:00 PM   Specimen: Urine, Catheterized  Result Value Ref Range Status   Specimen Description URINE, CATHETERIZED  Final   Special Requests   Final    NONE Performed at [facility], [place] [address] [place], [place] [address]    Culture MULTIPLE SPECIES PRESENT, SUGGEST RECOLLECTION (A)  Final   Report Status 01/15/2019 FINAL  Final  Culture, blood (routine x 2)     Status: None   Collection Time: 01/14/19  3:44 PM   Specimen: BLOOD RIGHT HAND  Result Value Ref Range Status   Specimen Description BLOOD RIGHT HAND  Final   Special Requests   Final    BOTTLES DRAWN AEROBIC ONLY Blood Culture adequate volume   Culture   Final    NO GROWTH 5 DAYS Performed at [facility], [place] [address],  [place], [place] [address]    Report Status 01/19/2019 FINAL  Final  Culture, blood (routine x 2)     Status: None   Collection Time: 01/14/19  3:44 PM   Specimen: BLOOD RIGHT ARM  Result Value Ref Range Status   Specimen Description BLOOD RIGHT ARM  Final   Special Requests   Final    BOTTLES DRAWN AEROBIC AND ANAEROBIC Blood Culture results may not be optimal due to an excessive volume of blood received in culture bottles   Culture   Final    NO GROWTH 5 DAYS Performed at [facility], [place] [address] [place], [place] [address]    Report Status 01/19/2019 FINAL  Final  Culture, Urine     Status: None   Collection Time: 01/15/19 11:51 AM   Specimen: Urine, Suprapubic  Result Value Ref Range Status   Specimen Description URINE, SUPRAPUBIC  Final   Special Requests NONE  Final   Culture   Final    NO GROWTH Performed at [facility], [place] [address], [place], [place] [address]    Report Status 01/16/2019 FINAL  Final  Culture, blood (routine x 2)     Status: None   Collection Time: 01/17/19  4:23 PM   Specimen: BLOOD RIGHT ARM  Result Value Ref Range Status   Specimen Description BLOOD RIGHT ARM  Final   Special Requests   Final    BOTTLES DRAWN AEROBIC ONLY Blood Culture adequate volume   Culture   Final    NO GROWTH 5 DAYS Performed at [facility], [place] [address], [place], [place] [address]    Report Status 01/22/2019 FINAL  Final  Culture, blood (routine x 2)     Status: None   Collection Time: 01/17/19  4:23 PM   Specimen: BLOOD RIGHT ARM  Result Value Ref Range Status   Specimen Description BLOOD RIGHT ARM  Final   Special Requests   Final    BOTTLES DRAWN AEROBIC ONLY Blood Culture adequate volume   Culture   Final    NO GROWTH 5 DAYS Performed at [facility], [place] [address] [place],  [address]    Report Status 01/22/2019 FINAL  Final      Studies: No results found.  Scheduled Meds: . sodium chloride   Intravenous Once  .  artificial  tears   Right Eye QHS  . bacitracin  1 application Topical Q000111Q  . chlorhexidine  15 mL Mouth Rinse BID  . feeding supplement  1 Container Oral TID BM  . feeding supplement (PRO-STAT SUGAR FREE 64)  30 mL Per Tube BID  . [START ON 01/24/2019] ferrous sulfate  300 mg Per Tube Q breakfast  . free water  200 mL Per Tube Q8H  . gabapentin  100 mg Per Tube TID  . mouth rinse  15 mL Mouth Rinse q12n4p  . Melatonin  6 mg Oral QHS  . memantine  5 mg Oral Daily  . multivitamin  15 mL Oral Daily  . pantoprazole sodium  40 mg Per Tube Daily  . potassium & sodium phosphates  1 packet Oral TID WC & HS  . pramipexole  0.125 mg Oral QHS  . sertraline  50 mg Oral Daily    Continuous Infusions: . dextrose 5 % and 0.45 % NaCl with KCl 20 mEq/L 75 mL/hr at 01/21/19 0512  . feeding supplement (OSMOLITE 1.5 CAL) 1,000 mL (01/23/19 0528)     LOS: 12 days     Kayleen Memos, MD [facility] Pager [phone]  If 7PM-7AM, please contact night-coverage [email or web address] Password [email or web address] 01/23/2019, 4:03 PM

## 2019-01-23 NOTE — Progress Notes (Addendum)
Occupational Therapy Treatment Patient Details Name: Anthony [name] MRN: [id number] DOB: Feb 09, 1941 Today's Date: 01/23/2019    History of present illness LEMARIO GIARRAPUTO is an 78 y.o. male with squamous cell carcinoma of right neck s/p radical neck dissection, parotidectomy and cervical pectoral flap. PMH includes CKD stage III, COPD, dementia, depression, chronic bladder outlet obstruction and urinary retention, BPH s/p suprapubic catheter, restless leg syndrome, chronic trach, PEG tube admitted for radical neck dissection secondary to underlying squamous cell carcinoma of right neck. Pt underwent R parotidectomy revision on 11/8.   OT comments  Patient progressing slowly towards OT goals, eager to get OOB and engage in session today.  Patient demonstrates ability to complete transfers with min assist (+2 safety/lines), requires total assist +2 for toileting hygiene (RN assisting while therapist provides min assist for balance in sustained standing), min assist for stand pivot transfers with RW.   Encouraged AROM of L UE as tolerated throughout the day, able to perform AROM to 90 degrees today. Patient continues to be limited by pain, generalized weakness and impaired balance.  Will follow acutely, dc plan remains appropriate.    Follow Up Recommendations  SNF;Supervision/Assistance - 24 hour    Equipment Recommendations  Other (comment)(TBD at next venue of care)    Recommendations for Other Services      Precautions / Restrictions Precautions Precautions: Fall Precaution Comments: trach collar 28% 5L 02, JP drain, PEG tube Restrictions Weight Bearing Restrictions: No       Mobility Bed Mobility Overal bed mobility: Needs Assistance Bed Mobility: Rolling;Sidelying to Sit Rolling: Min assist Sidelying to sit: Mod assist;HOB elevated       General bed mobility comments: pt positioned to L side, min assist to roll towards R and transition trunk to EOB; limited by pain in R  shoulder/neck to utilize R UE   Transfers Overall transfer level: Needs assistance Equipment used: Rolling walker (2 wheeled) Transfers: Sit to/from [facility] Sit to Stand: Min assist;+2 safety/equipment Stand pivot transfers: Min assist;+2 safety/equipment       General transfer comment: patient transitioned to standing with min assist +2 with cueing for hand placement and safety, pivoting to recliner with min assist to steady given increased time     Balance Overall balance assessment: Needs assistance Sitting-balance support: No upper extremity supported;Feet supported Sitting balance-Leahy Scale: Fair     Standing balance support: Bilateral upper extremity supported;During functional activity Standing balance-Leahy Scale: Fair Standing balance comment: relaint on BUE and external support                           ADL either performed or assessed with clinical judgement   ADL Overall ADL's : Needs assistance/impaired     Grooming: Wash/dry face;Set up;Sitting Grooming Details (indicate cue type and reason): pt using BUEs         Upper Body Dressing : Maximal assistance;Sitting Upper Body Dressing Details (indicate cue type and reason): to don new gown, limited by lines/tubes     Toilet Transfer: Minimal assistance;+2 for safety/equipment;Stand-pivot;Cueing for safety;Cueing for sequencing;RW Toilet Transfer Details (indicate cue type and reason): simulated to recliner  Toileting- Clothing Manipulation and Hygiene: Total assistance;+2 for physical assistance;+2 for safety/equipment;Sit to/from stand Toileting - Clothing Manipulation Details (indicate cue type and reason): RN assisting with hygiene and therapist providing min assist for balance after incontinent BM in bed     Functional mobility during ADLs: Minimal assistance;+2 for safety/equipment;Rolling walker;Cueing for  safety;Cueing for sequencing General ADL Comments: pt limited by  pain, weakness and balance      Vision       Perception     Praxis      Cognition Arousal/Alertness: Awake/alert Behavior During Therapy: WFL for tasks assessed/performed;Flat affect Overall Cognitive Status: Impaired/Different from baseline Area of Impairment: Following commands;Awareness;Problem solving                       Following Commands: Follows one step commands consistently;Follows one step commands with increased time;Follows multi-step commands inconsistently   Awareness: Emergent Problem Solving: Requires verbal cues;Difficulty sequencing;Decreased initiation;Slow processing General Comments: pt following commands, aware of bowel incontinence         Exercises Exercises: Other exercises Other Exercises Other Exercises: AROM of R UE within tolerance to 90 FF x 5 reps    Shoulder Instructions       General Comments VSS, RN reattached neck drain to suction     Pertinent Vitals/ Pain       Pain Assessment: Faces Faces Pain Scale: Hurts little more Pain Location: generalized Pain Descriptors / Indicators: Discomfort;Grimacing;Operative site guarding;Sore Pain Intervention(s): Limited activity within patient's tolerance;Monitored during session;Premedicated before session;Repositioned  Home Living                                          Prior Functioning/Environment              Frequency  Min 2X/week        Progress Toward Goals  OT Goals(current goals can now be found in the care plan section)  Progress towards OT goals: Progressing toward goals  Acute Rehab OT Goals Patient Stated Goal: to get stronger and move more OT Goal Formulation: With patient  Plan Discharge plan remains appropriate    Co-evaluation                 AM-PAC OT "6 Clicks" Daily Activity     Outcome Measure   Help from another person eating meals?: Total Help from another person taking care of personal grooming?: A  Little Help from another person toileting, which includes using toliet, bedpan, or urinal?: Total Help from another person bathing (including washing, rinsing, drying)?: A Lot Help from another person to put on and taking off regular upper body clothing?: A Lot Help from another person to put on and taking off regular lower body clothing?: Total 6 Click Score: 10    End of Session Equipment Utilized During Treatment: Oxygen;Gait belt(via trach collar )  OT Visit Diagnosis: Other abnormalities of gait and mobility (R26.89);Muscle weakness (generalized) (M62.81);Pain;Other symptoms and signs involving cognitive function Pain - Right/Left: Right Pain - part of body: Shoulder(neck/generalized)   Activity Tolerance Patient tolerated treatment well   Patient Left with call bell/phone within reach;in chair;with chair alarm set;with nursing/sitter in room   Nurse Communication Mobility status;Precautions        Time: 1010-1038 OT Time Calculation (min): 28 min  Charges: OT General Charges $OT Visit: 1 Visit OT Treatments $Self Care/Home Management : 23-37 mins  Delight Stare, [place] Pager [phone] Office [phone]    Delight Stare 01/23/2019, 11:14 AM

## 2019-01-23 NOTE — Progress Notes (Signed)
Paged dr bates, pt's jp drain not holding suction, reapplied to wall suction.

## 2019-01-24 DIAGNOSIS — Z931 Gastrostomy status: Secondary | ICD-10-CM

## 2019-01-24 DIAGNOSIS — Z7409 Other reduced mobility: Secondary | ICD-10-CM

## 2019-01-24 LAB — BASIC METABOLIC PANEL
Anion gap: 11 (ref 5–15)
BUN: 42 mg/dL — ABNORMAL HIGH (ref 8–23)
CO2: 20 mmol/L — ABNORMAL LOW (ref 22–32)
Calcium: 8.1 mg/dL — ABNORMAL LOW (ref 8.9–10.3)
Chloride: 108 mmol/L (ref 98–111)
Creatinine, Ser: 1.8 mg/dL — ABNORMAL HIGH (ref 0.61–1.24)
GFR calc Af Amer: 40 mL/min — ABNORMAL LOW (ref >60)
GFR calc non Af Amer: 35 mL/min — ABNORMAL LOW (ref >60)
Glucose, Bld: 129 mg/dL — ABNORMAL HIGH (ref 70–99)
Potassium: 4.5 mmol/L (ref 3.5–5.1)
Sodium: 139 mmol/L (ref 135–145)

## 2019-01-24 LAB — CBC
HCT: 26.8 % — ABNORMAL LOW (ref 39.0–52.0)
Hemoglobin: 8.2 g/dL — ABNORMAL LOW (ref 13.0–17.0)
MCH: 29.3 pg (ref 26.0–34.0)
MCHC: 30.6 g/dL (ref 30.0–36.0)
MCV: 95.7 fL (ref 80.0–100.0)
Platelets: 287 10*3/uL (ref 150–400)
RBC: 2.8 MIL/uL — ABNORMAL LOW (ref 4.22–5.81)
RDW: 15 % (ref 11.5–15.5)
WBC: 9.3 10*3/uL (ref 4.0–10.5)
nRBC: 0 % (ref 0.0–0.2)

## 2019-01-24 NOTE — Progress Notes (Signed)
Nutrition Follow-up  RD working remotely.   DOCUMENTATION CODES:   Non-severe (moderate) malnutrition in context of chronic illness  INTERVENTION:  - continue Osmolite 1.5 @ 50 ml/hr with 30 ml prostat BID and 200 ml water TID.    NUTRITION DIAGNOSIS:   Moderate Malnutrition related to chronic illness(neck cancer, COPD, dementia) as evidenced by mild fat depletion, moderate fat depletion, moderate muscle depletion. -ongoing  GOAL:   Patient will meet greater than or equal to 90% of their needs -met with TF regimen  MONITOR:   PO intake, Supplement acceptance, TF tolerance, Diet advancement, Skin, Weight trends, I & O's  ASSESSMENT:   78 year old male who presented on 10/29. PMH neck cancer, lower lip and scalp cancers, CKD stage III, COPD, dementia, dysphagia, GERD, HTN. S/p parotidectomy, radical neck dissection, cervical pectoral rotation flap on 10/29.  Weight has been fairly stable over the past week. Patient is a/o to self and place. Boost Breeze is ordered TID and patient was accepting it nearly every time offered while on CLD, but he has been made NPO since 11/7.   He has PEG in place and is receiving Osmolite 1.5 @ 50 ml/hr with 30 ml prostat BID and 200 ml water TID. This regimen is providing 2000 kcal, 105 grams protein, and 1514 ml free water.   Patient with R neck cancer s/p parotidectomy, neck dissection, and cervical pectoral flap; he underwent revision on 11/8.     Labs reviewed; BUN: 42 mg/dl, creatinine: 1.8 mg/dl, Ca: 8.1 mg/dl, GFR: 35 ml/min. Medications reviewed; 300 mg ferrous sulfate/day, 510 mg IV feraheme x1 run 11/10, 6 mg melatonin/night, 15 ml liquid multivitamin/day, 1 packet Phos-nak TID.     Diet Order:   Diet Order            Diet NPO time specified  Diet effective midnight              EDUCATION NEEDS:   Not appropriate for education at this time  Skin:  Skin Assessment: Skin Integrity Issues: Skin Integrity Issues::  Incisions Incisions: neck (11/8)  Last BM:  11/8  Height:   Ht Readings from Last 1 Encounters:  01/11/19 6' 3" (1.905 m)    Weight:   Wt Readings from Last 1 Encounters:  01/24/19 80.8 kg    Ideal Body Weight:  89.1 kg  BMI:  Body mass index is 22.26 kg/m.  Estimated Nutritional Needs:   Kcal:  2000-2200  Protein:  100-115 grams  Fluid:  >/= 1.8 L     Jarome Matin, MS, RD, LDN, [facility] Inpatient Clinical Dietitian Pager # [phone] After hours/weekend pager # [phone]

## 2019-01-24 NOTE — Plan of Care (Signed)

## 2019-01-24 NOTE — Consult Note (Signed)
PROGRESS NOTE    Anthony [name]  [id number] DOB: 07/05/1940 DOA: 01/11/2019 PCP: Pablo Ledger, MD   Consulting physician: Dr. Redmond Baseman Reason for consult: Medical management  Brief Narrative:  Anthony Donna Smithis an 78 y.o.malesquamous cell carcinoma of right neck s/p radical neck dissection, parotidectomy and cervical pectoral flap, CKD stage III, COPD, dementia, depression, chronic bladder outlet obstruction and urinary retention, BPH s/p suprapubic catheter, restless leg syndrome, has chronic trach, PEG tube admitted after radical neck dissection secondary to underlying squamous cell carcinoma of right neck and fungating mass. Patient developed fever 103 with leukocytosis on day 2 surgery so medicine consulted. Patient remained in the hospital with low blood pressures, febrile, anemia.Temperature improved.   01/22/19: Patient was seen and examined at his bedside this morning.  No acute events overnight.  He had no complaint this morning.  Trach collar in place, no acute distress.  PEG tube in place.  01/23/19: Patient was seen and examined at his bedside this morning.  No acute events overnight.  No new complaints  01/24/19: Pt seen and examined. Lying in bed, asking for food. No acute issues overnight.    Procedures: Right neck cancer status post parotidectomy, neck dissection, cervical pectoral flap and status post revision on 01/21/2019  Antimicrobials:   Vancomycin, 01/14/2019-01/16/2019  cefepime, 01/14/2019----01/19/2019     Objective: Vitals:   01/24/19 0712 01/24/19 0745 01/24/19 1056 01/24/19 1127  BP:  (!) 113/55 106/72   Pulse: 74 66 85 73  Resp: 17 13 (!) 21 19  Temp:  97.7 F (36.5 C) 97.7 F (36.5 C)   TempSrc:  Oral Oral   SpO2: 98% 100% 100% 100%  Weight:      Height:        Intake/Output Summary (Last 24 hours) at 01/24/2019 1502 Last data filed at 01/24/2019 0606 Gross per 24 hour  Intake 350 ml  Output 1400 ml  Net -1050 ml   Filed  Weights   01/20/19 0500 01/22/19 0500 01/24/19 0605  Weight: 81.7 kg 79.8 kg 80.8 kg    Examination:  General exam: Appears calm and comfortable , laying in bed.  ID Respiratory system: On trach collar, clear to auscultation without wheezing or rales Cardiovascular system: S1 & S2 heard, RRR. No JVD, murmurs, rubs, gallops or clicks. Gastrointestinal system: Abdomen is nondistended, soft and nontender.  PEG in place, positive bowel sounds Central nervous system: Awake and alert Extremities: No cyanosis or edema Skin: Warm and dry Psychiatry:  Mood & affect appropriate.     Data Reviewed: I have personally reviewed following labs and imaging studies  CBC: Recent Labs  Lab 01/18/19 0214 01/19/19 0217 01/21/19 0240 01/22/19 0725 01/24/19 0128  WBC 7.5 6.0 7.3 9.6 9.3  NEUTROABS 5.9 3.9 4.8  --   --   HGB 6.8* 7.7* 8.2* 8.0* 8.2*  HCT 22.6* 24.1* 26.1* 25.8* 26.8*  MCV 97.0 94.1 95.6 94.5 95.7  PLT 170 173 216 274 [id number]   Basic Metabolic Panel: Recent Labs  Lab 01/18/19 0214 01/19/19 0217 01/21/19 0240 01/22/19 0725 01/23/19 0533 01/24/19 0311  NA 135 136 136 136  --  139  K 4.6 4.6 4.7 4.6  --  4.5  CL 110 112* 110 107  --  108  CO2 19* 19* 20* 20*  --  20*  GLUCOSE 153* 133* 151* 138*  --  129*  BUN 42* 41* 36* 34*  --  42*  CREATININE 1.77* 1.74* 1.60* 1.64* 1.68* 1.80*  CALCIUM 7.4*  7.7* 7.9* 7.8*  --  8.1*  MG 1.9 2.0  --   --   --   --   PHOS 2.2* 2.3*  --   --   --   --    GFR: Estimated Creatinine Clearance: 37.4 mL/min (A) (by C-G formula based on SCr of 1.8 mg/dL (H)). Liver Function Tests: Recent Labs  Lab 01/18/19 0214  AST 18  ALT 16  ALKPHOS 64  BILITOT 0.4  PROT 5.8*  ALBUMIN 1.6*   No results for input(s): LIPASE, AMYLASE in the last 168 hours. No results for input(s): AMMONIA in the last 168 hours. Coagulation Profile: No results for input(s): INR, PROTIME in the last 168 hours. Cardiac Enzymes: No results for input(s): CKTOTAL,  CKMB, CKMBINDEX, TROPONINI in the last 168 hours. BNP (last 3 results) No results for input(s): PROBNP in the last 8760 hours. HbA1C: No results for input(s): HGBA1C in the last 72 hours. CBG: Recent Labs  Lab 01/21/19 1108  GLUCAP 124*   Lipid Profile: No results for input(s): CHOL, HDL, LDLCALC, TRIG, CHOLHDL, LDLDIRECT in the last 72 hours. Thyroid Function Tests: No results for input(s): TSH, T4TOTAL, FREET4, T3FREE, THYROIDAB in the last 72 hours. Anemia Panel: No results for input(s): VITAMINB12, FOLATE, FERRITIN, TIBC, IRON, RETICCTPCT in the last 72 hours. Sepsis Labs: Recent Labs  Lab 01/17/19 1617  LATICACIDVEN 1.0    Recent Results (from the past 240 hour(s))  Culture, blood (routine x 2)     Status: None   Collection Time: 01/14/19  3:44 PM   Specimen: BLOOD RIGHT HAND  Result Value Ref Range Status   Specimen Description BLOOD RIGHT HAND  Final   Special Requests   Final    BOTTLES DRAWN AEROBIC ONLY Blood Culture adequate volume   Culture   Final    NO GROWTH 5 DAYS Performed at [facility], [address] [place], [place] [address]    Report Status 01/19/2019 FINAL  Final  Culture, blood (routine x 2)     Status: None   Collection Time: 01/14/19  3:44 PM   Specimen: BLOOD RIGHT ARM  Result Value Ref Range Status   Specimen Description BLOOD RIGHT ARM  Final   Special Requests   Final    BOTTLES DRAWN AEROBIC AND ANAEROBIC Blood Culture results may not be optimal due to an excessive volume of blood received in culture bottles   Culture   Final    NO GROWTH 5 DAYS Performed at [facility], [place] [address] [place], [place] [address]    Report Status 01/19/2019 FINAL  Final  Culture, Urine     Status: None   Collection Time: 01/15/19 11:51 AM   Specimen: Urine, Suprapubic  Result Value Ref Range Status   Specimen Description URINE, SUPRAPUBIC  Final   Special Requests NONE  Final   Culture   Final    NO GROWTH Performed at [facility], [place] [address] [place], [place] [address]    Report Status 01/16/2019 FINAL  Final  Culture, blood (routine x 2)     Status: None   Collection Time: 01/17/19  4:23 PM   Specimen: BLOOD RIGHT ARM  Result Value Ref Range Status   Specimen Description BLOOD RIGHT ARM  Final   Special Requests   Final    BOTTLES DRAWN AEROBIC ONLY Blood Culture adequate volume   Culture   Final    NO GROWTH 5 DAYS Performed at [facility], [place] [address] [place], [place] [address]    Report Status 01/22/2019 FINAL  Final  Culture, blood (routine x 2)     Status: None   Collection Time: 01/17/19  4:23 PM   Specimen: BLOOD RIGHT ARM  Result Value Ref Range Status   Specimen Description BLOOD RIGHT ARM  Final   Special Requests   Final    BOTTLES DRAWN AEROBIC ONLY Blood Culture adequate volume   Culture   Final    NO GROWTH 5 DAYS Performed at [facility], [place] [address], [place], [place] [address]    Report Status 01/22/2019 FINAL  Final  SARS CORONAVIRUS 2 (TAT 6-24 HRS) Nasopharyngeal Nasopharyngeal Swab     Status: None   Collection Time: 01/23/19  4:31 PM   Specimen: Nasopharyngeal Swab  Result Value Ref Range Status   SARS Coronavirus 2 NEGATIVE NEGATIVE Final    Comment: (NOTE) SARS-CoV-2 target nucleic acids are NOT DETECTED. The SARS-CoV-2 RNA is generally detectable in upper and lower respiratory specimens during the acute phase of infection. Negative results do not preclude SARS-CoV-2 infection, do not rule out co-infections with other pathogens, and should not be used as the sole basis for treatment or other patient management decisions. Negative results must be combined with clinical observations, patient history, and epidemiological information. The expected result is Negative. Fact Sheet for Patients: SugarRoll.be Fact Sheet for Healthcare Providers: https://www.woods-mathews.com/ This test is not yet approved or  cleared by the [place] FDA and  has been authorized for detection and/or diagnosis of SARS-CoV-2 by FDA under an Emergency Use Authorization (EUA). This EUA will remain  in effect (meaning this test can be used) for the duration of the COVID-19 declaration under Section 56 4(b)(1) of the Act, 21 U.S.C. section 360bbb-3(b)(1), unless the authorization is terminated or revoked sooner. Performed at [facility], [place] [address] [place], [place] [address]          Radiology Studies: No results found.      Scheduled Meds: . sodium chloride   Intravenous Once  . artificial tears   Right Eye QHS  . bacitracin  1 application Topical [id number]  . chlorhexidine  15 mL Mouth Rinse BID  . feeding supplement  1 Container Oral TID BM  . feeding supplement (PRO-STAT SUGAR FREE 64)  30 mL Per Tube BID  . ferrous sulfate  300 mg Per Tube Q breakfast  . free water  200 mL Per Tube Q8H  . gabapentin  100 mg Per Tube TID  . mouth rinse  15 mL Mouth Rinse q12n4p  . Melatonin  6 mg Oral QHS  . memantine  5 mg Oral Daily  . multivitamin  15 mL Oral Daily  . pantoprazole sodium  40 mg Per Tube Daily  . polyvinyl alcohol  1 drop Right Eye Q4H while awake  . potassium & sodium phosphates  1 packet Oral TID WC & HS  . pramipexole  0.125 mg Oral QHS  . sertraline  50 mg Oral Daily   Continuous Infusions: . dextrose 5 % and 0.45 % NaCl with KCl 20 mEq/L 75 mL/hr at 01/21/19 0512  . feeding supplement (OSMOLITE 1.5 CAL) 1,000 mL (01/24/19 0400)    Assessment & Plan:   Active Problems:   Anemia   Tobacco abuse   COPD (chronic obstructive pulmonary disease) (HCC)   CKD (chronic kidney disease) stage 3, GFR 30-59 ml/min   Dementia (HCC)   Tracheostomy  in place [facility])   Head and neck cancer ([place])   Malnutrition of moderate degree   Depression   Suprapubic catheter (HCC)   Restless leg syndrome   1.Resolved postoperative fever: -presumed UTI due to indwelling suprapubic  catheter. s/pCatheter exchanged.  -All cultures are negative.Repeat cultures after antibiotics started negative.  Urine culture initially with multiple organisms Patient completed antibiotic therapy Procalcitonin and lactic acid were normal.   Chronic kidney disease stage III:Was on maintenance IV fluids.  Monitor renal function closely.  Avoid nephrotoxins such as NSAIDs.   Iron deficiency anemia/anemia of chronic disease Iron studies suggestive of iron deficiency S/p  IV Feraheme 510 mg once Continue ferrous sulfate daily via PEG tube Received 1 unit of PRBC with appropriate improvement. Could not tolerate iron.  Continue monitoring CBC  Dementia, deconditioning and impaired mobility: Status post tracheostomy and functioning well.  Chest x-ray was normal. PEG tube infusing well.  Patient will start clear liquid diet  PT recommends SNF Continue PT OT with fall precautions  Right neck cancer status post parotidectomy, neck dissection, cervical pectoral flap and status post revision on 01/21/2019 Stable per primary Drain removed following revision tissue resection.  Progressing well. Per primary team possibly d/c to SNF in am , covid testing pending  Dysphagia post PEG tube placement. Continue PEG tube feedings Maintain head of bed greater than 30 degrees angle Aspiration precautions   DVT prophylaxis: SCD Code Status: Full code  family Communication: None at bedside Disposition Plan: To be discharged from primary team standpoint.  We will sign off .  Medical issues stable if any issues should arise please do not hesitate to contact us       LOS: 13 days   Time spent: 45 minutes    Nolberto Hanlon, MD [facility] Pager [phone]  If 7PM-7AM, please contact night-coverage [email or web address] Password [email or web address] 01/24/2019, 3:02 PM

## 2019-01-24 NOTE — Progress Notes (Signed)
   Subjective:    Patient ID: Anthony [name], male    DOB: [birth year], 78 y.o.   MRN: [id number]  HPI  Had a fall overnight.  Incident report initiated.  No acute injury noted.  Neuro checks since then with no problems.  No complaints this morning.  Review of Systems     Objective:   Physical Exam AF VSS Alert, NAD Right neck/chest incision clean with slight dehiscence supeiolaterally.  Skin flap healthy-appearing.  Drain removed. Right facial weakness unchanged. WBC 9.3  Hgb 8.2    Assessment & Plan:  Right neck cancer s/p right parotidectomy, neck dissection, and cervicopectoral flap.  Drain removed following revision tissue resection.  Progressing well.  Incision with slight dehiscence but looking fairly good.  Likely discharge to nursing facility tomorrow or Friday, pending COVID testing.  Continue right eye care.

## 2019-01-24 NOTE — Plan of Care (Signed)

## 2019-01-24 NOTE — Progress Notes (Signed)
Physical Therapy Treatment Patient Details Name: ALFREAD SRINIVAS MRN: [id number] DOB: [birth year] Today's Date: 01/24/2019    History of Present Illness VONTRELL STENDAHL is an 78 y.o. male with squamous cell carcinoma of right neck s/p radical neck dissection, parotidectomy and cervical pectoral flap. PMH includes CKD stage III, COPD, dementia, depression, chronic bladder outlet obstruction and urinary retention, BPH s/p suprapubic catheter, restless leg syndrome, chronic trach, PEG tube admitted for radical neck dissection secondary to underlying squamous cell carcinoma of right neck. Pt underwent R parotidectomy revision on 11/8.    PT Comments    Pt tolerated treatment well despite R wrist pain with WB. Pt reports he came to hospital with R wrist splint, however unable to be found in room. Pt requires increased assistance for transfers due to R wrist pain, limiting ability to push up from chair. Pt demonstrates some improvement in activity tolerance, however continues to fatigue quickly. Pt will continue to benefit from acute PT services to reduce falls risk and restore his prior level of function.   Follow Up Recommendations  SNF;Supervision for mobility/OOB     Equipment Recommendations  None recommended by PT    Recommendations for Other Services       Precautions / Restrictions Precautions Precautions: Fall Precaution Comments: trach collar 28% 5L 02, JP drain, PEG tube Restrictions Weight Bearing Restrictions: No    Mobility  Bed Mobility Overal bed mobility: (pt up in recliner upon arrival)                Transfers Overall transfer level: Needs assistance Equipment used: Rolling walker (2 wheeled) Transfers: Sit to/from Stand Sit to Stand: Mod assist         General transfer comment: pt requiring increased physical assistance due to pain in R wrist  Ambulation/Gait Ambulation/Gait assistance: Min assist Gait Distance (Feet): 20 Feet(15' for additional  trial) Assistive device: Rolling walker (2 wheeled) Gait Pattern/deviations: Step-to pattern;Trunk flexed;Wide base of support Gait velocity: reduced Gait velocity interpretation: <1.31 ft/sec, indicative of household ambulator General Gait Details: pt with slowed step to gait, wide BOS, poor endurance   Stairs             Wheelchair Mobility    Modified Rankin (Stroke Patients Only)       Balance Overall balance assessment: Needs assistance Sitting-balance support: No upper extremity supported;Feet supported Sitting balance-Leahy Scale: Good Sitting balance - Comments: close supervision   Standing balance support: Bilateral upper extremity supported Standing balance-Leahy Scale: Fair Standing balance comment: minA with BUE support of RW                            Cognition Arousal/Alertness: Awake/alert Behavior During Therapy: WFL for tasks assessed/performed;Flat affect Overall Cognitive Status: No family/caregiver present to determine baseline cognitive functioning                                 General Comments: appears WFL but does demonstrate some short term memory deficits      Exercises      General Comments General comments (skin integrity, edema, etc.): VSS, pt ambulating on room air, RN assisting withy chair follow      Pertinent Vitals/Pain Pain Assessment: Faces Faces Pain Scale: Hurts even more Pain Location: R wrist Pain Descriptors / Indicators: Aching Pain Intervention(s): Limited activity within patient's tolerance    Home Living  Prior Function            PT Goals (current goals can now be found in the care plan section) Acute Rehab PT Goals Patient Stated Goal: to get stronger and move more Progress towards PT goals: Progressing toward goals    Frequency    Min 2X/week      PT Plan Current plan remains appropriate    Co-evaluation              AM-PAC PT "6  Clicks" Mobility   Outcome Measure  Help needed turning from your back to your side while in a flat bed without using bedrails?: A Little Help needed moving from lying on your back to sitting on the side of a flat bed without using bedrails?: A Little Help needed moving to and from a bed to a chair (including a wheelchair)?: A Little Help needed standing up from a chair using your arms (e.g., wheelchair or bedside chair)?: A Lot Help needed to walk in hospital room?: A Little Help needed climbing 3-5 steps with a railing? : Total 6 Click Score: 15    End of Session Equipment Utilized During Treatment: Oxygen Activity Tolerance: Patient tolerated treatment well Patient left: in chair;with call bell/phone within reach;with chair alarm set Nurse Communication: Mobility status PT Visit Diagnosis: Pain;Muscle weakness (generalized) (M62.81);Dizziness and giddiness (R42) Pain - Right/Left: Right Pain - part of body: Arm(wrist)     Time: [id number] PT Time Calculation (min) (ACUTE ONLY): 23 min  Charges:  $Gait Training: 8-22 mins $Therapeutic Activity: 8-22 mins                     Zenaida Niece, PT, DPT Acute Rehabilitation Pager: [phone]    Zenaida Niece 01/24/2019, 3:51 PM

## 2019-01-25 MED ORDER — POLYVINYL ALCOHOL 1.4 % OP SOLN: 1.0000 [drp] | OPHTHALMIC | 5 refills | Status: AC

## 2019-01-25 MED ORDER — BACITRACIN ZINC 500 UNIT/GM EX OINT: 1.0000 "application " | TOPICAL_OINTMENT | Freq: Three times a day (TID) | CUTANEOUS | 0 refills | Status: AC

## 2019-01-25 MED ORDER — ARTIFICIAL TEARS OPHTHALMIC OINT: TOPICAL_OINTMENT | Freq: Every day | OPHTHALMIC | 5 refills | Status: AC

## 2019-01-25 NOTE — Plan of Care (Signed)

## 2019-01-25 NOTE — Progress Notes (Signed)
Discharge packet given to [facility], [facility] [place]. Report called to nurse at [facility].

## 2019-01-25 NOTE — Progress Notes (Signed)
CSW spoke with Anthony [name] at [facility] in [place] they report having all of patient's meds ready for him and agreeable for CSW to call PT[facility]. CSW has informed RN that patient's medications will be filled at SNF and they are prepared for him.   [place], [place]

## 2019-01-25 NOTE — TOC Transition Note (Signed)
Transition of Care [facility]) - CM/SW Discharge Note   Patient Details  Name: Anthony [name] MRN: [id number] Date of Birth: 19-Apr-1940  [facility] of [facility] CM/SW Contact:  Alberteen Sam, LCSW Phone Number: 01/25/2019, 9:17 AM   Clinical Narrative:     Patient will DC to: [facility] Anticipated DC date: 01/25/2019 Family notified:Melissa (legal guardian) Transport [id number]  Per MD patient ready for DC to [facility]. RN, patient, patient's family, and facility notified of DC. Discharge Summary sent to facility. RN given number for report  [phone] Room 510 (ask to speak with 500 hall nurse) . DC packet on chart. Ambulance transport requested for patient.  CSW signing off.  [place], [place]   Final next level of care: Skilled Nursing Facility Barriers to Discharge: No Barriers Identified   Patient Goals and CMS Choice   CMS Medicare.gov Compare Post Acute Care list provided to:: Patient Represenative (must comment)(Melissa) Choice offered to / list presented to : [facility] POA / Guardian(Melissa)  Discharge Placement PASRR number recieved: 01/18/19            Patient chooses bed at: [facility] Patient to be transferred to facility by: [facility] Name of family member notified: Lenna Sciara (legal guardian) Patient and family notified of of transfer: 01/25/19  Discharge Plan and Services     Post Acute Care Choice: [place]                               Social Determinants of Health (SDOH) Interventions     Readmission Risk Interventions No flowsheet data found.

## 2019-01-25 NOTE — Discharge Summary (Signed)
Physician Discharge Summary  Patient ID: Anthony [name] MRN: [id number] DOB/AGE: 1940/10/20 78 y.o.  Admit date: 01/11/2019 Discharge date: 01/25/2019  Admission Diagnoses: Right neck cancer  Discharge Diagnoses:  Active Problems:   Anemia   Tobacco abuse   COPD (chronic obstructive pulmonary disease) (HCC)   CKD (chronic kidney disease) stage 3, GFR 30-59 ml/min   Dementia (HCC)   Tracheostomy in place (HCC)   Head and neck cancer (HCC)   Malnutrition of moderate degree   Depression   Suprapubic catheter (HCC)   Restless leg syndrome   S/P percutaneous endoscopic gastrostomy (PEG) tube placement ([place])   Gastrostomy present ([place])   Very poor mobility   Discharged Condition: fair  Hospital Course: 78 year old male with history of lower lip cancer removed earlier this year who more recently developed a right neck mass that was proven carcinoma by FNA.  The mass fungated out through the skin by the time of his presentation for surgery.  He underwent removal of the mass via right parotidectomy, radical neck dissection, and cervicopectoral flap.  See operative note.  He was observed in a progressive care unit thereafter initially with drains in place.  He developed fever post-operatively that was ultimately felt to be urinary in source.  His suprapubic tube was changed and he was treated with broad spectrum antibiotics with resolution.  He developed gradually worsening anemia as well and required blood transfusion due to hypotension.  The hospitalist team aided in his care.  His deep surgical margin ultimately returned positive so he was taken back to the operating room for additional resection of tissue and trimming of the flap where there was necrosis with re-closure.  He has done well since that surgery and his drain was removed after a couple of days.  He had a fall incident that did not result in injury.  At this point, the wound has some dehiscence but the flap looks healthy.  Dressing  changes have been initiated.  His right facial nerve became weak following his first surgery and right eye care with Lacri-lube at bedtime and frequent artificial tears during the day was initiated.  At this point, he is felt stable for discharge back to the nursing facility.  Consults: hospitalist  Significant Diagnostic Studies: None  Treatments: surgery: Right parotidectomy, right radical neck dissection, cervicopectoral flap, and revision right parotidectomy.  Discharge Exam: Blood pressure (!) 91/57, pulse 61, temperature 97.6 F (36.4 C), temperature source Oral, resp. rate 18, height 6\' 3"  (1.905 m), weight 79.1 kg, SpO2 92 %. General appearance: alert and no distress Neck: right neck and chest incision with some dehiscence superiorly, staples removed from chest portion of incision, gauze packing initiated in superior wound, skin flap healthy, trach tube in place Neurologic: Cranial nerves: VII: upper facial muscle function reduced on the right, VII: lower facial muscle function reduced on the right  Disposition: Discharge disposition: 03-Skilled Nursing Facility       Discharge Instructions    Diet - low sodium heart healthy   Complete by: As directed    Discharge instructions   Complete by: As directed    Resume diet and tube feeds as before.  Saline wet-to-dry gauze packing to right upper neck incision twice daily.  Apply Bacitracin ointment to incision twice daily.  Resume routine trach care, G-tube, and suprapubic catheter care.   Increase activity slowly   Complete by: As directed      Allergies as of 01/25/2019  Reactions   Bee Venom Anaphylaxis   Penicillins Hives   Has patient had a PCN reaction causing immediate rash, facial/tongue/throat swelling, SOB or lightheadedness with hypotension: NO Has patient had a PCN reaction causing severe rash involving mucus membranes or skin necrosis: NO Has patient had a PCN reaction that required hospitalization: NO Has  patient had a PCN reaction occurring within the last 10 years: NO If all of the above answers are "NO", then may proceed with Cephalosporin use.   Strawberry Extract Rash      Medication List    TAKE these medications   acetaminophen 325 MG tablet Commonly known as: TYLENOL Take 650 mg by mouth 4 (four) times daily.   artificial tears Oint ophthalmic ointment Commonly known as: LACRILUBE Place into the right eye at bedtime.   bacitracin ointment Apply 1 application topically every 8 (eight) hours.   free water Soln Place 200 mLs into feeding tube every 8 (eight) hours.   gabapentin 100 MG capsule Commonly known as: NEURONTIN Take 100 mg by mouth 3 (three) times daily.   MELATONIN PO Take 6 mg by mouth at bedtime.   memantine 5 MG tablet Commonly known as: NAMENDA Take 1 tablet (5 mg total) by mouth daily. (0900)   multivitamin with minerals Tabs tablet Take 1 tablet by mouth daily.   omeprazole 20 MG capsule Commonly known as: PRILOSEC Take 20 mg by mouth daily.   Oxycodone HCl 10 MG Tabs Take 1 tablet by mouth every 6 hours as needed for pain.   polyethylene glycol 17 g packet Commonly known as: MiraLax Take 17 g by mouth daily.   polyvinyl alcohol 1.4 % ophthalmic solution Commonly known as: LIQUIFILM TEARS Place 1 drop into the right eye every 4 (four) hours while awake.   pramipexole 0.125 MG tablet Commonly known as: MIRAPEX Take 0.125 mg by mouth at bedtime. (2100)   sertraline 50 MG tablet Commonly known as: ZOLOFT Take 50 mg by mouth daily.   VITAMIN C PO Take 1 tablet by mouth daily.   Zinc 50 MG Tabs Take 50 mg by mouth daily.       Contact information for follow-up providers    Melida Quitter, MD. Schedule an appointment as soon as possible for a visit on 01/30/2019.   Specialty: Otolaryngology Contact information: [address] [place] [address] [phone]            Contact information for  after-discharge care    [facility] .   Service: Skilled Nursing Contact information: [address] [place] [phone]                  Signed: Melida Quitter 01/25/2019, 8:25 AM

## 2019-01-29 LAB — SURGICAL PATHOLOGY

## 2019-02-01 DIAGNOSIS — F0390 Unspecified dementia without behavioral disturbance: Secondary | ICD-10-CM | POA: Diagnosis not present

## 2019-02-01 DIAGNOSIS — C799 Secondary malignant neoplasm of unspecified site: Secondary | ICD-10-CM | POA: Diagnosis not present

## 2019-02-05 DIAGNOSIS — N183 Chronic kidney disease, stage 3 unspecified: Secondary | ICD-10-CM | POA: Diagnosis not present

## 2019-02-05 DIAGNOSIS — J449 Chronic obstructive pulmonary disease, unspecified: Secondary | ICD-10-CM | POA: Diagnosis not present

## 2019-07-14 DEATH — deceased
